# Patient Record
Sex: Male | Born: 1940
Health system: Southern US, Community
[De-identification: ages and names within clinical notes are randomized; demographics above are authoritative.]

## PROBLEM LIST (undated history)

## (undated) DIAGNOSIS — Z9289 Personal history of other medical treatment: Secondary | ICD-10-CM

## (undated) DIAGNOSIS — I4891 Unspecified atrial fibrillation: Secondary | ICD-10-CM

## (undated) DIAGNOSIS — N183 Chronic kidney disease, stage 3 unspecified: Secondary | ICD-10-CM

## (undated) DIAGNOSIS — I350 Nonrheumatic aortic (valve) stenosis: Principal | ICD-10-CM

## (undated) DIAGNOSIS — G4700 Insomnia, unspecified: Secondary | ICD-10-CM

## (undated) DIAGNOSIS — Z9989 Dependence on other enabling machines and devices: Secondary | ICD-10-CM

## (undated) DIAGNOSIS — K635 Polyp of colon: Secondary | ICD-10-CM

## (undated) DIAGNOSIS — E119 Type 2 diabetes mellitus without complications: Secondary | ICD-10-CM

## (undated) DIAGNOSIS — Z950 Presence of cardiac pacemaker: Secondary | ICD-10-CM

## (undated) DIAGNOSIS — M109 Gout, unspecified: Secondary | ICD-10-CM

## (undated) DIAGNOSIS — Z87442 Personal history of urinary calculi: Secondary | ICD-10-CM

## (undated) DIAGNOSIS — E785 Hyperlipidemia, unspecified: Secondary | ICD-10-CM

## (undated) DIAGNOSIS — Z973 Presence of spectacles and contact lenses: Secondary | ICD-10-CM

## (undated) DIAGNOSIS — Z972 Presence of dental prosthetic device (complete) (partial): Secondary | ICD-10-CM

## (undated) DIAGNOSIS — G629 Polyneuropathy, unspecified: Secondary | ICD-10-CM

## (undated) DIAGNOSIS — G4733 Obstructive sleep apnea (adult) (pediatric): Secondary | ICD-10-CM

## (undated) DIAGNOSIS — I219 Acute myocardial infarction, unspecified: Secondary | ICD-10-CM

## (undated) DIAGNOSIS — G510 Bell's palsy: Secondary | ICD-10-CM

## (undated) DIAGNOSIS — I2510 Atherosclerotic heart disease of native coronary artery without angina pectoris: Secondary | ICD-10-CM

## (undated) DIAGNOSIS — N186 End stage renal disease: Secondary | ICD-10-CM

## (undated) DIAGNOSIS — I10 Essential (primary) hypertension: Secondary | ICD-10-CM

## (undated) DIAGNOSIS — I214 Non-ST elevation (NSTEMI) myocardial infarction: Secondary | ICD-10-CM

## (undated) DIAGNOSIS — I495 Sick sinus syndrome: Secondary | ICD-10-CM

## (undated) DIAGNOSIS — F419 Anxiety disorder, unspecified: Secondary | ICD-10-CM

## (undated) HISTORY — DX: Insomnia, unspecified: G47.00

## (undated) HISTORY — DX: Sick sinus syndrome: I49.5

## (undated) HISTORY — DX: Gout, unspecified: M10.9

## (undated) HISTORY — DX: Unspecified atrial fibrillation: I48.91

## (undated) HISTORY — PX: HERNIA REPAIR: SHX51

## (undated) HISTORY — PX: CATARACT EXTRACTION: SUR2

## (undated) HISTORY — DX: Acute myocardial infarction, unspecified: I21.9

## (undated) HISTORY — PX: APPENDECTOMY: SHX54

## (undated) HISTORY — DX: Atherosclerotic heart disease of native coronary artery without angina pectoris: I25.10

## (undated) HISTORY — DX: Chronic kidney disease, stage 3 unspecified: N18.30

## (undated) HISTORY — DX: Type 2 diabetes mellitus without complications: E11.9

## (undated) HISTORY — DX: Essential (primary) hypertension: I10

## (undated) HISTORY — DX: Hyperlipidemia, unspecified: E78.5

## (undated) HISTORY — DX: Non-ST elevation (NSTEMI) myocardial infarction: I21.4

## (undated) HISTORY — DX: Chronic kidney disease, stage 3 (moderate): N18.3

## (undated) HISTORY — DX: Obstructive sleep apnea (adult) (pediatric): G47.33

## (undated) HISTORY — PX: CORONARY ANGIOPLASTY WITH STENT PLACEMENT: SHX49

## (undated) HISTORY — DX: Polyneuropathy, unspecified: G62.9

## (undated) HISTORY — DX: Dependence on other enabling machines and devices: Z99.89

## (undated) HISTORY — DX: Polyp of colon: K63.5

---

## 1996-02-17 DIAGNOSIS — I219 Acute myocardial infarction, unspecified: Secondary | ICD-10-CM

## 1996-02-17 HISTORY — DX: Acute myocardial infarction, unspecified: I21.9

## 2000-03-19 HISTORY — PX: OTHER SURGICAL HISTORY: SHX169

## 2000-05-10 HISTORY — PX: INSERT / REPLACE / REMOVE PACEMAKER: SUR710

## 2009-09-03 HISTORY — PX: PACEMAKER GENERATOR CHANGE: SHX5998

## 2011-02-24 DIAGNOSIS — IMO0001 Reserved for inherently not codable concepts without codable children: Secondary | ICD-10-CM | POA: Diagnosis not present

## 2011-03-27 DIAGNOSIS — I10 Essential (primary) hypertension: Secondary | ICD-10-CM | POA: Diagnosis not present

## 2011-03-27 DIAGNOSIS — IMO0001 Reserved for inherently not codable concepts without codable children: Secondary | ICD-10-CM | POA: Diagnosis not present

## 2011-05-07 DIAGNOSIS — IMO0001 Reserved for inherently not codable concepts without codable children: Secondary | ICD-10-CM | POA: Diagnosis not present

## 2011-05-07 DIAGNOSIS — N183 Chronic kidney disease, stage 3 unspecified: Secondary | ICD-10-CM | POA: Diagnosis not present

## 2011-05-26 DIAGNOSIS — I10 Essential (primary) hypertension: Secondary | ICD-10-CM | POA: Diagnosis not present

## 2011-05-26 DIAGNOSIS — M25519 Pain in unspecified shoulder: Secondary | ICD-10-CM | POA: Diagnosis not present

## 2011-07-16 DIAGNOSIS — IMO0001 Reserved for inherently not codable concepts without codable children: Secondary | ICD-10-CM | POA: Diagnosis not present

## 2011-07-16 DIAGNOSIS — I10 Essential (primary) hypertension: Secondary | ICD-10-CM | POA: Diagnosis not present

## 2011-07-22 DIAGNOSIS — E119 Type 2 diabetes mellitus without complications: Secondary | ICD-10-CM | POA: Diagnosis not present

## 2011-11-04 DIAGNOSIS — I10 Essential (primary) hypertension: Secondary | ICD-10-CM | POA: Diagnosis not present

## 2011-11-04 DIAGNOSIS — IMO0001 Reserved for inherently not codable concepts without codable children: Secondary | ICD-10-CM | POA: Diagnosis not present

## 2011-11-04 DIAGNOSIS — E785 Hyperlipidemia, unspecified: Secondary | ICD-10-CM | POA: Diagnosis not present

## 2011-11-11 DIAGNOSIS — Z23 Encounter for immunization: Secondary | ICD-10-CM | POA: Diagnosis not present

## 2011-11-11 DIAGNOSIS — IMO0001 Reserved for inherently not codable concepts without codable children: Secondary | ICD-10-CM | POA: Diagnosis not present

## 2012-01-26 DIAGNOSIS — I4892 Unspecified atrial flutter: Secondary | ICD-10-CM | POA: Diagnosis not present

## 2012-01-30 DIAGNOSIS — Z7982 Long term (current) use of aspirin: Secondary | ICD-10-CM | POA: Diagnosis not present

## 2012-01-30 DIAGNOSIS — E119 Type 2 diabetes mellitus without complications: Secondary | ICD-10-CM | POA: Diagnosis not present

## 2012-01-30 DIAGNOSIS — R002 Palpitations: Secondary | ICD-10-CM | POA: Diagnosis not present

## 2012-01-30 DIAGNOSIS — N189 Chronic kidney disease, unspecified: Secondary | ICD-10-CM | POA: Diagnosis not present

## 2012-01-30 DIAGNOSIS — Z79899 Other long term (current) drug therapy: Secondary | ICD-10-CM | POA: Diagnosis not present

## 2012-01-30 DIAGNOSIS — R079 Chest pain, unspecified: Secondary | ICD-10-CM | POA: Diagnosis not present

## 2012-01-30 DIAGNOSIS — I252 Old myocardial infarction: Secondary | ICD-10-CM | POA: Diagnosis not present

## 2012-01-30 DIAGNOSIS — Z950 Presence of cardiac pacemaker: Secondary | ICD-10-CM | POA: Diagnosis not present

## 2012-02-01 DIAGNOSIS — Z45018 Encounter for adjustment and management of other part of cardiac pacemaker: Secondary | ICD-10-CM | POA: Diagnosis not present

## 2012-02-04 DIAGNOSIS — E785 Hyperlipidemia, unspecified: Secondary | ICD-10-CM | POA: Diagnosis not present

## 2012-02-04 DIAGNOSIS — M19079 Primary osteoarthritis, unspecified ankle and foot: Secondary | ICD-10-CM | POA: Diagnosis not present

## 2012-02-04 DIAGNOSIS — M79609 Pain in unspecified limb: Secondary | ICD-10-CM | POA: Diagnosis not present

## 2012-02-15 DIAGNOSIS — I4891 Unspecified atrial fibrillation: Secondary | ICD-10-CM | POA: Diagnosis not present

## 2012-02-22 DIAGNOSIS — Z7901 Long term (current) use of anticoagulants: Secondary | ICD-10-CM | POA: Diagnosis not present

## 2012-02-22 DIAGNOSIS — I4892 Unspecified atrial flutter: Secondary | ICD-10-CM | POA: Diagnosis not present

## 2012-02-26 DIAGNOSIS — Z7901 Long term (current) use of anticoagulants: Secondary | ICD-10-CM | POA: Diagnosis not present

## 2012-02-26 DIAGNOSIS — I4892 Unspecified atrial flutter: Secondary | ICD-10-CM | POA: Diagnosis not present

## 2012-02-29 DIAGNOSIS — Z7901 Long term (current) use of anticoagulants: Secondary | ICD-10-CM | POA: Diagnosis not present

## 2012-03-03 DIAGNOSIS — I4892 Unspecified atrial flutter: Secondary | ICD-10-CM | POA: Diagnosis not present

## 2012-03-03 DIAGNOSIS — Z7901 Long term (current) use of anticoagulants: Secondary | ICD-10-CM | POA: Diagnosis not present

## 2012-03-07 DIAGNOSIS — Z7901 Long term (current) use of anticoagulants: Secondary | ICD-10-CM | POA: Diagnosis not present

## 2012-03-31 DIAGNOSIS — I4891 Unspecified atrial fibrillation: Secondary | ICD-10-CM | POA: Diagnosis not present

## 2012-04-05 DIAGNOSIS — E785 Hyperlipidemia, unspecified: Secondary | ICD-10-CM | POA: Diagnosis not present

## 2012-04-05 DIAGNOSIS — IMO0001 Reserved for inherently not codable concepts without codable children: Secondary | ICD-10-CM | POA: Diagnosis not present

## 2012-04-05 DIAGNOSIS — I10 Essential (primary) hypertension: Secondary | ICD-10-CM | POA: Diagnosis not present

## 2012-04-12 DIAGNOSIS — IMO0001 Reserved for inherently not codable concepts without codable children: Secondary | ICD-10-CM | POA: Diagnosis not present

## 2012-04-29 DIAGNOSIS — Z5320 Procedure and treatment not carried out because of patient's decision for unspecified reasons: Secondary | ICD-10-CM | POA: Diagnosis not present

## 2012-04-29 DIAGNOSIS — R7309 Other abnormal glucose: Secondary | ICD-10-CM | POA: Diagnosis not present

## 2012-05-09 DIAGNOSIS — IMO0001 Reserved for inherently not codable concepts without codable children: Secondary | ICD-10-CM | POA: Diagnosis not present

## 2012-05-09 DIAGNOSIS — M109 Gout, unspecified: Secondary | ICD-10-CM | POA: Diagnosis not present

## 2012-05-18 DIAGNOSIS — IMO0001 Reserved for inherently not codable concepts without codable children: Secondary | ICD-10-CM | POA: Diagnosis not present

## 2012-07-29 DIAGNOSIS — M109 Gout, unspecified: Secondary | ICD-10-CM | POA: Diagnosis not present

## 2012-07-29 DIAGNOSIS — N183 Chronic kidney disease, stage 3 unspecified: Secondary | ICD-10-CM | POA: Diagnosis not present

## 2012-07-29 DIAGNOSIS — IMO0001 Reserved for inherently not codable concepts without codable children: Secondary | ICD-10-CM | POA: Diagnosis not present

## 2012-08-01 DIAGNOSIS — Z45018 Encounter for adjustment and management of other part of cardiac pacemaker: Secondary | ICD-10-CM | POA: Diagnosis not present

## 2012-08-03 DIAGNOSIS — E119 Type 2 diabetes mellitus without complications: Secondary | ICD-10-CM | POA: Diagnosis not present

## 2012-10-03 DIAGNOSIS — S51809A Unspecified open wound of unspecified forearm, initial encounter: Secondary | ICD-10-CM | POA: Diagnosis not present

## 2012-12-07 DIAGNOSIS — E785 Hyperlipidemia, unspecified: Secondary | ICD-10-CM | POA: Diagnosis not present

## 2012-12-07 DIAGNOSIS — M109 Gout, unspecified: Secondary | ICD-10-CM | POA: Diagnosis not present

## 2012-12-07 DIAGNOSIS — I10 Essential (primary) hypertension: Secondary | ICD-10-CM | POA: Diagnosis not present

## 2012-12-07 DIAGNOSIS — N183 Chronic kidney disease, stage 3 unspecified: Secondary | ICD-10-CM | POA: Diagnosis not present

## 2012-12-07 DIAGNOSIS — IMO0001 Reserved for inherently not codable concepts without codable children: Secondary | ICD-10-CM | POA: Diagnosis not present

## 2012-12-12 DIAGNOSIS — Z23 Encounter for immunization: Secondary | ICD-10-CM | POA: Diagnosis not present

## 2012-12-13 DIAGNOSIS — IMO0001 Reserved for inherently not codable concepts without codable children: Secondary | ICD-10-CM | POA: Diagnosis not present

## 2012-12-23 DIAGNOSIS — I4891 Unspecified atrial fibrillation: Secondary | ICD-10-CM | POA: Diagnosis not present

## 2012-12-27 DIAGNOSIS — R1011 Right upper quadrant pain: Secondary | ICD-10-CM | POA: Diagnosis not present

## 2012-12-27 DIAGNOSIS — R1032 Left lower quadrant pain: Secondary | ICD-10-CM | POA: Diagnosis not present

## 2012-12-30 DIAGNOSIS — N200 Calculus of kidney: Secondary | ICD-10-CM | POA: Diagnosis not present

## 2012-12-30 DIAGNOSIS — N2883 Nephroptosis: Secondary | ICD-10-CM | POA: Diagnosis not present

## 2013-01-19 DIAGNOSIS — I4891 Unspecified atrial fibrillation: Secondary | ICD-10-CM | POA: Diagnosis not present

## 2013-02-15 DIAGNOSIS — I4891 Unspecified atrial fibrillation: Secondary | ICD-10-CM | POA: Diagnosis not present

## 2013-02-15 DIAGNOSIS — Z79899 Other long term (current) drug therapy: Secondary | ICD-10-CM | POA: Diagnosis not present

## 2013-02-21 DIAGNOSIS — I4892 Unspecified atrial flutter: Secondary | ICD-10-CM | POA: Diagnosis not present

## 2013-03-08 DIAGNOSIS — M109 Gout, unspecified: Secondary | ICD-10-CM | POA: Diagnosis not present

## 2013-03-08 DIAGNOSIS — IMO0001 Reserved for inherently not codable concepts without codable children: Secondary | ICD-10-CM | POA: Diagnosis not present

## 2013-03-15 DIAGNOSIS — IMO0001 Reserved for inherently not codable concepts without codable children: Secondary | ICD-10-CM | POA: Diagnosis not present

## 2013-05-30 DIAGNOSIS — IMO0001 Reserved for inherently not codable concepts without codable children: Secondary | ICD-10-CM | POA: Diagnosis not present

## 2013-05-30 DIAGNOSIS — I10 Essential (primary) hypertension: Secondary | ICD-10-CM | POA: Diagnosis not present

## 2013-05-30 DIAGNOSIS — E785 Hyperlipidemia, unspecified: Secondary | ICD-10-CM | POA: Diagnosis not present

## 2013-05-30 DIAGNOSIS — M109 Gout, unspecified: Secondary | ICD-10-CM | POA: Diagnosis not present

## 2013-06-06 DIAGNOSIS — L570 Actinic keratosis: Secondary | ICD-10-CM | POA: Diagnosis not present

## 2013-09-05 DIAGNOSIS — E119 Type 2 diabetes mellitus without complications: Secondary | ICD-10-CM | POA: Diagnosis not present

## 2013-09-05 DIAGNOSIS — Z1331 Encounter for screening for depression: Secondary | ICD-10-CM | POA: Diagnosis not present

## 2013-09-05 DIAGNOSIS — M109 Gout, unspecified: Secondary | ICD-10-CM | POA: Diagnosis not present

## 2013-09-05 DIAGNOSIS — I10 Essential (primary) hypertension: Secondary | ICD-10-CM | POA: Diagnosis not present

## 2013-09-05 DIAGNOSIS — G4700 Insomnia, unspecified: Secondary | ICD-10-CM | POA: Diagnosis not present

## 2013-09-05 DIAGNOSIS — Z9049 Acquired absence of other specified parts of digestive tract: Secondary | ICD-10-CM | POA: Diagnosis not present

## 2013-09-05 DIAGNOSIS — E785 Hyperlipidemia, unspecified: Secondary | ICD-10-CM | POA: Diagnosis not present

## 2013-09-05 DIAGNOSIS — N183 Chronic kidney disease, stage 3 unspecified: Secondary | ICD-10-CM | POA: Diagnosis not present

## 2013-10-10 ENCOUNTER — Encounter: Payer: Self-pay | Admitting: Cardiovascular Disease

## 2013-10-10 ENCOUNTER — Ambulatory Visit (INDEPENDENT_AMBULATORY_CARE_PROVIDER_SITE_OTHER): Payer: Medicare Other | Admitting: Cardiovascular Disease

## 2013-10-10 VITALS — BP 130/76 | HR 60 | Ht 71.5 in | Wt 230.4 lb

## 2013-10-10 DIAGNOSIS — I4891 Unspecified atrial fibrillation: Secondary | ICD-10-CM | POA: Diagnosis not present

## 2013-10-10 DIAGNOSIS — I480 Paroxysmal atrial fibrillation: Secondary | ICD-10-CM

## 2013-10-10 DIAGNOSIS — I2510 Atherosclerotic heart disease of native coronary artery without angina pectoris: Secondary | ICD-10-CM

## 2013-10-10 DIAGNOSIS — M109 Gout, unspecified: Secondary | ICD-10-CM | POA: Insufficient documentation

## 2013-10-10 LAB — CBC WITH DIFFERENTIAL/PLATELET
BASOS ABS: 0 10*3/uL (ref 0.0–0.1)
Basophils Relative: 0.4 % (ref 0.0–3.0)
EOS ABS: 0.5 10*3/uL (ref 0.0–0.7)
Eosinophils Relative: 6.8 % — ABNORMAL HIGH (ref 0.0–5.0)
HEMATOCRIT: 40.5 % (ref 39.0–52.0)
HEMOGLOBIN: 13.6 g/dL (ref 13.0–17.0)
LYMPHS ABS: 1.7 10*3/uL (ref 0.7–4.0)
Lymphocytes Relative: 21.3 % (ref 12.0–46.0)
MCHC: 33.6 g/dL (ref 30.0–36.0)
MCV: 92.5 fl (ref 78.0–100.0)
MONOS PCT: 6.4 % (ref 3.0–12.0)
Monocytes Absolute: 0.5 10*3/uL (ref 0.1–1.0)
NEUTROS ABS: 5.1 10*3/uL (ref 1.4–7.7)
Neutrophils Relative %: 65.1 % (ref 43.0–77.0)
Platelets: 139 10*3/uL — ABNORMAL LOW (ref 150.0–400.0)
RBC: 4.38 Mil/uL (ref 4.22–5.81)
RDW: 14.4 % (ref 11.5–15.5)
WBC: 7.9 10*3/uL (ref 4.0–10.5)

## 2013-10-10 LAB — BASIC METABOLIC PANEL
BUN: 33 mg/dL — ABNORMAL HIGH (ref 6–23)
CO2: 25 meq/L (ref 19–32)
Calcium: 9 mg/dL (ref 8.4–10.5)
Chloride: 105 mEq/L (ref 96–112)
Creatinine, Ser: 2.6 mg/dL — ABNORMAL HIGH (ref 0.4–1.5)
GFR: 25.33 mL/min — ABNORMAL LOW (ref >60.00)
GLUCOSE: 274 mg/dL — AB (ref 70–99)
POTASSIUM: 5.5 meq/L — AB (ref 3.5–5.1)
SODIUM: 136 meq/L (ref 135–145)

## 2013-10-10 LAB — TSH: TSH: 1.94 u[IU]/mL (ref 0.35–4.50)

## 2013-10-10 LAB — HEPATIC FUNCTION PANEL
ALT: 50 U/L (ref 0–53)
AST: 34 U/L (ref 0–37)
Albumin: 3.8 g/dL (ref 3.5–5.2)
Alkaline Phosphatase: 78 U/L (ref 39–117)
Bilirubin, Direct: 0.1 mg/dL (ref 0.0–0.3)
Total Bilirubin: 0.7 mg/dL (ref 0.2–1.2)
Total Protein: 7.1 g/dL (ref 6.0–8.3)

## 2013-10-10 LAB — LIPID PANEL
Cholesterol: 112 mg/dL (ref 0–200)
HDL: 38.1 mg/dL — ABNORMAL LOW (ref >39.00)
NonHDL: 73.9
Total CHOL/HDL Ratio: 3
Triglycerides: 237 mg/dL — ABNORMAL HIGH (ref 0.0–149.0)
VLDL: 47.4 mg/dL — AB (ref 0.0–40.0)

## 2013-10-10 MED ORDER — NITROGLYCERIN 0.4 MG SL SUBL: 0.4000 mg | SUBLINGUAL_TABLET | SUBLINGUAL | Status: DC | PRN

## 2013-10-10 NOTE — Patient Instructions (Signed)
Your physician recommends that you have lab work:  TODAY - Thyroid, liver, basic metabolic, CBC, lipid  Your physician recommends that you continue on your current medications as directed. Please refer to the Current Medication list given to you today.  Your physician wants you to follow-up in: 1 year with Dr. [name].  You will receive a reminder letter in the mail two months in advance. If you don't receive a letter, please call our office to schedule the follow-up appointment.

## 2013-10-10 NOTE — Progress Notes (Signed)
Bradley Soto Date of Birth  [birth year]       [facility]    [employer] [address], [place], [place]  [address]   [place], [place]  [address] [place] [place]   Fax  [phone]     Fax [phone]  Problem List: 1. Coronary artery disease-status post stenting 2. Sick sinus syndrome-status post pacemaker. History of atrial fib 3. CKD 4. Diabetes Mellitus 5. Gout 6.   History of Present Illness:  Pt recently moved from [place], [place] ( near [employer])  Here to establish cardiology care. 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     Current Outpatient Prescriptions  Medication Sig Dispense Refill  . allopurinol (ZYLOPRIM) 100 MG tablet Take 100 mg by mouth 2 (two) times daily.      Marland Kitchen amiodarone (PACERONE) 200 MG tablet Take 200 mg by mouth daily.      Marland Kitchen amLODipine (NORVASC) 2.5 MG tablet Take 2.5 mg by mouth daily.      . cimetidine (TAGAMET) 300 MG tablet Take 300 mg by mouth 4 (four) times daily.      . clopidogrel (PLAVIX) 75 MG tablet Take 75 mg by mouth daily.      . colchicine 0.6 MG tablet Take 2.5 mg by mouth as needed.      . dabigatran (PRADAXA) 75 MG CAPS capsule Take 75 mg by mouth 2 (two) times daily.      Marland Kitchen diltiazem (CARDIZEM) 60 MG tablet Take 60 mg by mouth 4 (four) times daily as needed.       . isosorbide dinitrate (ISORDIL) 30 MG tablet Take 30 mg by mouth daily.      . Melatonin 10 MG TABS Take by mouth at bedtime as needed.      . metoprolol succinate (TOPROL-XL) 100 MG 24 hr tablet Take 100 mg by mouth 2 (two) times daily. Take with or immediately following a meal. 1/2 IN THE AM  1/2 IN THE PM      . nitroGLYCERIN (NITROSTAT) 0.4 MG SL tablet Place 0.4 mg under the tongue every 5 (five) minutes as needed for chest pain.      . simvastatin (ZOCOR) 20 MG tablet Take 20 mg by mouth daily. 1/2 TAB QD      . sitaGLIPtin (JANUVIA) 50  MG tablet Take 50 mg by mouth daily.      . traZODone (DESYREL) 50 MG tablet Take 50 mg by mouth at bedtime.       No current facility-administered medications for this visit.     Allergies  Allergen Reactions  . Shellfish Allergy     Past Medical History  Diagnosis Date  . Diabetes mellitus without complication   . MI (myocardial infarction) 1998  . Gout     Past Surgical History  Procedure Laterality Date  . Coronary angioplasty with stent placement    . Insert / replace / remove pacemaker    . Partial colon removal    . Hernia repair    . Appendectomy      History  Smoking status  . Never Smoker   Smokeless tobacco  . Not on file    History  Alcohol Use No    Family History  Problem Relation Age of Onset  . Heart attack Father   . Breast cancer Sister   . Pulmonary embolism  Sister     Reviw of Systems:  Reviewed in the HPI.  All other systems are negative.  Physical Exam: Blood pressure 130/76, pulse 60, height 5' 11.5" (1.816 m), weight 230 lb 6.4 oz (104.509 kg). Wt Readings from Last 3 Encounters:  10/10/13 230 lb 6.4 oz (104.509 kg)     General: Well developed, well nourished, in no acute distress.  Head: Normocephalic, atraumatic, sclera non-icteric, mucus membranes are moist,   Neck: Supple. Carotids are 2 + without bruits. No JVD   Lungs: Clear   Heart: RR, normal S1S2  Abdomen: Soft, non-tender, non-distended with normal bowel sounds.  Msk:  Strength and tone are normal   Extremities: No clubbing or cyanosis. No edema.  Distal pedal pulses are 2+ and equal    Neuro: CN II - XII intact.  Alert and oriented X 3.   Psych:  Normal   ECG: October 10, 2013:   A pacing at 60   Assessment / Plan:

## 2013-10-10 NOTE — Assessment & Plan Note (Signed)
Bradley Soto is doing very well. [name] not having episodes of chest pain. We will continue with his same medications. We'll check fasting lipids, liver enzymes comment based about profile, CBC, TSH today. I will see him  again in one year. He has an appointment to see Dr. Rayann Heman in a week or so.

## 2013-10-11 ENCOUNTER — Telehealth: Payer: Self-pay | Admitting: *Deleted

## 2013-10-11 LAB — LDL CHOLESTEROL, DIRECT: LDL DIRECT: 50.5 mg/dL

## 2013-10-11 NOTE — Telephone Encounter (Signed)
Patient has a PCP now with [facility], Loleta Books. Instructed on decreasing dietary intake of potassium. Spoke with his wife, verbalizes understanding.

## 2013-10-11 NOTE — Telephone Encounter (Signed)
Message copied by Rodman Key on Wed Oct 11, 2013  5:14 PM ------      Message from: Thayer Headings      Created: Wed Oct 11, 2013  4:49 PM       I have replied to this but I dont think I addressed the potassiu,.  He has known CKD and i suspect these labs are stable.      Have him decrease his intake of potassium.        He needs to get a primary medical doctor            ----- Message -----         From: Rodman Key, RN         Sent: 10/11/2013   4:22 PM           To: Thayer Headings, MD            Hi Dr. [name],            Can you take a look at Mr. Reynold's labs from yesterday?      Please let me know if you want to make any changes based on his K+ 5.5 yesterday.      Thank you      Sindy Mccune       ------

## 2013-10-13 ENCOUNTER — Telehealth: Payer: Self-pay | Admitting: Nurse Practitioner

## 2013-10-13 DIAGNOSIS — E875 Hyperkalemia: Secondary | ICD-10-CM

## 2013-10-13 NOTE — Telephone Encounter (Signed)
Message copied by Emmaline Life on Fri Oct 13, 2013  4:10 PM ------      Message from: MURPHY, NIVIDA L      Created: Wed Oct 11, 2013  5:28 PM                   ----- Message -----         From: Thayer Headings, MD         Sent: 10/11/2013   4:48 PM           To: Lynann Bologna, RN            His creatinine is elevated. He has a known history of chronic kidney disease. We do not have any old laboratory to compare that clinically he stable. You will need to followup with his general medical Dr.\      Needs to work on a improved diet and exercise program. Recheck his fasting lipids, being that, and liver enzymes in 6 months. ------

## 2013-10-13 NOTE — Telephone Encounter (Signed)
Spoke with patient's wife and reviewed lab results and instructions per Dr. [name].  Patient is scheduled for repeat BMET Monday 8/31 when he comes in to see Dr. Rayann Heman.  Order in epic.  Per Dr. [name], he would like to order an echo to evaluation patient's LV function.  Wife states she believes patient has had one at cardiologist in [place] and will call to try to get a copy faxed to [place].  Wife states patient does not like to have tests done unless he is experiencing problems and states currently patient has no complaints.  Will await results of call to previous cardiologist.

## 2013-10-16 ENCOUNTER — Other Ambulatory Visit (INDEPENDENT_AMBULATORY_CARE_PROVIDER_SITE_OTHER): Payer: Medicare Other

## 2013-10-16 ENCOUNTER — Ambulatory Visit (INDEPENDENT_AMBULATORY_CARE_PROVIDER_SITE_OTHER): Payer: Medicare Other | Admitting: Internal Medicine

## 2013-10-16 VITALS — BP 142/74 | HR 60 | Ht 71.5 in | Wt 229.8 lb

## 2013-10-16 DIAGNOSIS — I2510 Atherosclerotic heart disease of native coronary artery without angina pectoris: Secondary | ICD-10-CM

## 2013-10-16 DIAGNOSIS — I4891 Unspecified atrial fibrillation: Secondary | ICD-10-CM | POA: Diagnosis not present

## 2013-10-16 DIAGNOSIS — E875 Hyperkalemia: Secondary | ICD-10-CM | POA: Diagnosis not present

## 2013-10-16 DIAGNOSIS — I495 Sick sinus syndrome: Secondary | ICD-10-CM | POA: Diagnosis not present

## 2013-10-16 DIAGNOSIS — I498 Other specified cardiac arrhythmias: Secondary | ICD-10-CM

## 2013-10-16 DIAGNOSIS — I480 Paroxysmal atrial fibrillation: Secondary | ICD-10-CM

## 2013-10-16 DIAGNOSIS — R001 Bradycardia, unspecified: Secondary | ICD-10-CM

## 2013-10-16 LAB — BASIC METABOLIC PANEL
BUN: 31 mg/dL — ABNORMAL HIGH (ref 6–23)
CHLORIDE: 107 meq/L (ref 96–112)
CO2: 23 meq/L (ref 19–32)
CREATININE: 2.2 mg/dL — AB (ref 0.4–1.5)
Calcium: 9.1 mg/dL (ref 8.4–10.5)
GFR: 30.93 mL/min — ABNORMAL LOW (ref >60.00)
GLUCOSE: 205 mg/dL — AB (ref 70–99)
Potassium: 4.8 mEq/L (ref 3.5–5.1)
Sodium: 137 mEq/L (ref 135–145)

## 2013-10-16 LAB — MDC_IDC_ENUM_SESS_TYPE_INCLINIC
Battery Impedance: 419 Ohm
Battery Remaining Longevity: 97 mo
Brady Statistic AP VP Percent: 0 %
Brady Statistic AS VP Percent: 0 %
Lead Channel Impedance Value: 572 Ohm
Lead Channel Pacing Threshold Amplitude: 0.5 V
Lead Channel Pacing Threshold Amplitude: 0.75 V
Lead Channel Sensing Intrinsic Amplitude: 1.4 mV
Lead Channel Sensing Intrinsic Amplitude: 22.4 mV
Lead Channel Setting Pacing Amplitude: 2.25 V
Lead Channel Setting Pacing Amplitude: 2.5 V
Lead Channel Setting Pacing Pulse Width: 0.4 ms
MDC IDC MSMT BATTERY VOLTAGE: 2.8 V
MDC IDC MSMT LEADCHNL RA PACING THRESHOLD PULSEWIDTH: 0.4 ms
MDC IDC MSMT LEADCHNL RV IMPEDANCE VALUE: 674 Ohm
MDC IDC MSMT LEADCHNL RV PACING THRESHOLD PULSEWIDTH: 0.4 ms
MDC IDC SESS DTM: 20150831112722
MDC IDC SET LEADCHNL RV SENSING SENSITIVITY: 5.6 mV
MDC IDC STAT BRADY AP VS PERCENT: 83 %
MDC IDC STAT BRADY AS VS PERCENT: 17 %

## 2013-10-16 NOTE — Patient Instructions (Signed)
Your physician wants you to follow-up in: 12 months with Dr Rayann Heman. You will receive a reminder letter in the mail two months in advance. If you don't receive a letter, please call our office to schedule the follow-up appointment.    Remote monitoring is used to monitor your Pacemaker of ICD from home. This monitoring reduces the number of office visits required to check your device to one time per year. It allows [place] to keep an eye on the functioning of your device to ensure it is working properly. You are scheduled for a device check from home on 01/17/14. You may send your transmission at any time that day. If you have a wireless device, the transmission will be sent automatically. After your physician reviews your transmission, you will receive a postcard with your next transmission date.

## 2013-10-16 NOTE — Progress Notes (Signed)
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  He reports initially presenting with fatigue and "low heart rate" in 2002 and underwent PPM 05/10/2000.  The patient reports doing very well since having a pacemaker implanted and remains very active despite his age.  He underwent PPM generator change (MDT) 09/03/2009.  Today, he  denies symptoms of palpitations, chest pain, shortness of breath, orthopnea, PND, lower extremity edema, dizziness, presyncope, syncope, or neurologic sequela.  He has OSA for which he uses CPAP.  The patientis tolerating medications without difficulties and is otherwise without complaint today.   Past Medical History  Diagnosis Date  . Diabetes mellitus without complication   . MI (myocardial infarction) 1998  . Gout   . CAD (coronary artery disease)     s/p stent placement  . CKD (chronic kidney disease), stage III   . OSA on CPAP   . Insomnia   . Colon polyps   . SSS (sick sinus syndrome)   . NSTEMI (non-ST elevated myocardial infarction)     s/p PCI in early 2000s  . HTN (hypertension)   . HLD (hyperlipidemia)   . Type II or unspecified type diabetes mellitus without mention of complication, not stated as uncontrolled    Past Surgical History  Procedure Laterality Date  . Coronary angioplasty with stent placement      x4  . Insert / replace / remove pacemaker  05/10/2000    pacemaker implanted in [place]  . Partial colon removal  2/2    polyp that could not be removed on colonoscopy  . Hernia repair      ventral hernia repair with mesh--revision also was required  . Appendectomy    . Cataract extraction    . Pacemaker generator change  09/03/09    MDT Adapta generator change in [place]    History   Social History  . Marital Status: Married    Spouse Name: N/A   Number of Children: N/A  . Years of Education: N/A   Occupational History  . Not on file.   Social History Main Topics  . Smoking status: Never Smoker   . Smokeless tobacco: Not on file  . Alcohol Use: No  . Drug Use: No  . Sexual Activity: Not on file   Other Topics Concern  . Not on file   Social History Narrative  . No narrative on file    Family History  Problem Relation Age of Onset  . Heart attack Father 16  . Heart disease Father   . Breast cancer Sister   . Pulmonary embolism Sister   . Deep vein thrombosis Sister     Allergies  Allergen Reactions  . Shellfish Allergy Rash    Current Outpatient Prescriptions  Medication Sig Dispense Refill  . allopurinol (ZYLOPRIM) 100 MG tablet Take 100 mg by mouth 2 (two) times daily.      Marland Kitchen amiodarone (PACERONE) 200 MG tablet Take 200 mg by mouth daily.      Marland Kitchen amLODipine (NORVASC) 2.5 MG tablet Take 2.5 mg by mouth daily.      . clopidogrel (PLAVIX) 75 MG tablet Take 75 mg by mouth daily.      . colchicine 0.6 MG tablet Take 0.6 mg by mouth as needed (gout).       Marland Kitchen  dabigatran (PRADAXA) 75 MG CAPS capsule Take 75 mg by mouth 2 (two) times daily.      Marland Kitchen diltiazem (CARDIZEM) 60 MG tablet Take 60 mg by mouth 4 (four) times daily as needed (palpitations).       . isosorbide dinitrate (ISORDIL) 30 MG tablet Take 30 mg by mouth daily.      . Melatonin 10 MG TABS Take 1 tablet by mouth at bedtime as needed (sleep).       . metoprolol succinate (TOPROL-XL) 100 MG 24 hr tablet Take 50 mg by mouth 2 (two) times daily. Take with or immediately following a meal.      . nitroGLYCERIN (NITROSTAT) 0.4 MG SL tablet Place 1 tablet (0.4 mg total) under the tongue every 5 (five) minutes as needed for chest pain.  45 tablet  6  . simvastatin (ZOCOR) 20 MG tablet Take 10 mg by mouth daily.       . sitaGLIPtin (JANUVIA) 50 MG tablet Take 50 mg by mouth daily.      . traZODone (DESYREL) 50 MG tablet Take 50 mg by mouth at bedtime.       No  current facility-administered medications for this visit.    ROS- all systems are reviewed and negative except as per HPI  Physical Exam: Filed Vitals:   10/16/13 1042  BP: 142/74  Pulse: 60  Height: 5' 11.5" (1.816 m)  Weight: 229 lb 12.8 oz (104.237 kg)    GEN- The patient is well appearing, alert and oriented x 3 today.   Head- normocephalic, atraumatic Eyes-  Sclera clear, conjunctiva pink Ears- hearing intact Oropharynx- clear Neck- supple, no JVP Lymph- no cervical lymphadenopathy Lungs- Clear to ausculation bilaterally, normal work of breathing Chest- pacemaker pocket is well healed Heart- Regular rate and rhythm, no murmurs, rubs or gallops, PMI not laterally displaced GI- soft, NT, ND, + BS Extremities- no clubbing, cyanosis, or edema MS- no significant deformity or atrophy Skin- no rash or lesion Psych- euthymic mood, full affect Neuro- strength and sensation are intact  Pacemaker interrogation- reviewed in detail today,  See PACEART report ekg today reveals atrial pacing, PR 178, otherwise normal ekg  Assessment and Plan:  1. Sick sinus syndrome Normal pacemaker function See Claudia Desanctis Art report No changes today Epic records are reviewed today Records from [place] are also reviewed  2. Atrial fibrillation Well controlled Continue pradaxa.  Dr [name] to follow creatinine clearance for dosing Continue amiodarone PCP to follow LFTs and TFTs  3. CAD Stable No change required today  Enroll in [employer] for remote monitoring Return to see me annually

## 2013-10-18 DIAGNOSIS — I2510 Atherosclerotic heart disease of native coronary artery without angina pectoris: Secondary | ICD-10-CM | POA: Diagnosis not present

## 2013-10-18 DIAGNOSIS — E119 Type 2 diabetes mellitus without complications: Secondary | ICD-10-CM | POA: Diagnosis not present

## 2013-10-18 DIAGNOSIS — Z125 Encounter for screening for malignant neoplasm of prostate: Secondary | ICD-10-CM | POA: Diagnosis not present

## 2013-10-19 ENCOUNTER — Telehealth: Payer: Self-pay | Admitting: Cardiovascular Disease

## 2013-10-19 NOTE — Telephone Encounter (Signed)
Results reviewed with patient's wife who verbalized understanding and gratitude

## 2013-10-19 NOTE — Telephone Encounter (Signed)
Follow up  Pt wife returned call for lab results//sr

## 2013-10-23 ENCOUNTER — Encounter: Payer: Self-pay | Admitting: Internal Medicine

## 2013-10-25 ENCOUNTER — Telehealth: Payer: Self-pay | Admitting: *Deleted

## 2013-10-25 DIAGNOSIS — R972 Elevated prostate specific antigen [PSA]: Secondary | ICD-10-CM | POA: Diagnosis not present

## 2013-10-25 DIAGNOSIS — E1129 Type 2 diabetes mellitus with other diabetic kidney complication: Secondary | ICD-10-CM | POA: Diagnosis not present

## 2013-10-25 DIAGNOSIS — E785 Hyperlipidemia, unspecified: Secondary | ICD-10-CM | POA: Diagnosis not present

## 2013-10-25 DIAGNOSIS — I4891 Unspecified atrial fibrillation: Secondary | ICD-10-CM | POA: Diagnosis not present

## 2013-10-25 DIAGNOSIS — I10 Essential (primary) hypertension: Secondary | ICD-10-CM | POA: Diagnosis not present

## 2013-10-25 DIAGNOSIS — M109 Gout, unspecified: Secondary | ICD-10-CM | POA: Diagnosis not present

## 2013-10-25 DIAGNOSIS — Z0000 Encounter for general adult medical examination without abnormal findings: Secondary | ICD-10-CM | POA: Diagnosis not present

## 2013-10-25 DIAGNOSIS — I259 Chronic ischemic heart disease, unspecified: Secondary | ICD-10-CM | POA: Diagnosis not present

## 2013-10-25 DIAGNOSIS — I495 Sick sinus syndrome: Secondary | ICD-10-CM | POA: Diagnosis not present

## 2013-10-25 NOTE — Telephone Encounter (Signed)
Pradaxa samples placed at the front desk for pick up.

## 2013-10-31 ENCOUNTER — Telehealth: Payer: Self-pay | Admitting: Internal Medicine

## 2013-10-31 NOTE — Telephone Encounter (Signed)
New message           Pt would like to have him bpm changed on his device

## 2013-11-02 NOTE — Telephone Encounter (Signed)
Appt made for 9-23 @ 11:30.

## 2013-11-06 DIAGNOSIS — Z23 Encounter for immunization: Secondary | ICD-10-CM | POA: Diagnosis not present

## 2013-11-08 ENCOUNTER — Encounter: Payer: Self-pay | Admitting: Internal Medicine

## 2013-11-08 ENCOUNTER — Ambulatory Visit (INDEPENDENT_AMBULATORY_CARE_PROVIDER_SITE_OTHER): Payer: Medicare Other | Admitting: *Deleted

## 2013-11-08 DIAGNOSIS — I495 Sick sinus syndrome: Secondary | ICD-10-CM

## 2013-11-08 LAB — MDC_IDC_ENUM_SESS_TYPE_INCLINIC
Battery Remaining Longevity: 90 mo
Brady Statistic AP VP Percent: 0 %
Brady Statistic AS VS Percent: 10 %
Lead Channel Impedance Value: 706 Ohm
Lead Channel Pacing Threshold Amplitude: 0.5 V
Lead Channel Pacing Threshold Pulse Width: 0.4 ms
Lead Channel Sensing Intrinsic Amplitude: 2 mV
Lead Channel Sensing Intrinsic Amplitude: 22.4 mV
Lead Channel Setting Pacing Amplitude: 2 V
Lead Channel Setting Pacing Pulse Width: 0.4 ms
Lead Channel Setting Sensing Sensitivity: 5.6 mV
MDC IDC MSMT BATTERY IMPEDANCE: 445 Ohm
MDC IDC MSMT BATTERY VOLTAGE: 2.79 V
MDC IDC MSMT LEADCHNL RA IMPEDANCE VALUE: 611 Ohm
MDC IDC MSMT LEADCHNL RA PACING THRESHOLD AMPLITUDE: 0.75 V
MDC IDC MSMT LEADCHNL RA PACING THRESHOLD PULSEWIDTH: 0.4 ms
MDC IDC SESS DTM: 20150923115902
MDC IDC SET LEADCHNL RV PACING AMPLITUDE: 2.5 V
MDC IDC STAT BRADY AP VS PERCENT: 89 %
MDC IDC STAT BRADY AS VP PERCENT: 0 %

## 2013-11-08 NOTE — Progress Notes (Signed)
Pacemaker check in clinic. Normal device function. Thresholds, sensing, impedances consistent with previous measurements. Device programmed to maximize longevity. 1 mode switch, 0.4%,  + pradaxa.  Max A >400/Max V 132bpm.  No high ventricular rates noted. Device programmed at appropriate safety margins. Histogram distribution  blunted, rate response programmed on today.   Device programmed to optimize intrinsic conduction. Estimated longevity 7.5 years. Patient enrolled in remote follow-up/TTM's with [employer]. Plan to follow every 3 months remotely and see annually in office. Patient education completed.  [facility] 02/12/14.

## 2013-11-10 ENCOUNTER — Encounter: Payer: Self-pay | Admitting: Cardiology

## 2014-01-22 DIAGNOSIS — L259 Unspecified contact dermatitis, unspecified cause: Secondary | ICD-10-CM | POA: Diagnosis not present

## 2014-01-22 DIAGNOSIS — Z6838 Body mass index (BMI) 38.0-38.9, adult: Secondary | ICD-10-CM | POA: Diagnosis not present

## 2014-01-24 DIAGNOSIS — L259 Unspecified contact dermatitis, unspecified cause: Secondary | ICD-10-CM | POA: Diagnosis not present

## 2014-01-24 DIAGNOSIS — Z6833 Body mass index (BMI) 33.0-33.9, adult: Secondary | ICD-10-CM | POA: Diagnosis not present

## 2014-01-24 DIAGNOSIS — I10 Essential (primary) hypertension: Secondary | ICD-10-CM | POA: Diagnosis not present

## 2014-01-24 DIAGNOSIS — E1165 Type 2 diabetes mellitus with hyperglycemia: Secondary | ICD-10-CM | POA: Diagnosis not present

## 2014-01-24 DIAGNOSIS — N183 Chronic kidney disease, stage 3 (moderate): Secondary | ICD-10-CM | POA: Diagnosis not present

## 2014-01-24 DIAGNOSIS — E785 Hyperlipidemia, unspecified: Secondary | ICD-10-CM | POA: Diagnosis not present

## 2014-01-24 DIAGNOSIS — Z79899 Other long term (current) drug therapy: Secondary | ICD-10-CM | POA: Diagnosis not present

## 2014-01-29 DIAGNOSIS — E875 Hyperkalemia: Secondary | ICD-10-CM | POA: Diagnosis not present

## 2014-02-14 ENCOUNTER — Other Ambulatory Visit: Payer: Self-pay | Admitting: *Deleted

## 2014-02-14 MED ORDER — DABIGATRAN ETEXILATE MESYLATE 75 MG PO CAPS: 75.0000 mg | ORAL_CAPSULE | Freq: Two times a day (BID) | ORAL | Status: DC

## 2014-03-07 ENCOUNTER — Encounter: Payer: Self-pay | Admitting: Internal Medicine

## 2014-03-07 ENCOUNTER — Ambulatory Visit (INDEPENDENT_AMBULATORY_CARE_PROVIDER_SITE_OTHER): Payer: Medicare Other | Admitting: *Deleted

## 2014-03-07 DIAGNOSIS — I495 Sick sinus syndrome: Secondary | ICD-10-CM | POA: Diagnosis not present

## 2014-03-07 DIAGNOSIS — I480 Paroxysmal atrial fibrillation: Secondary | ICD-10-CM | POA: Diagnosis not present

## 2014-03-07 LAB — MDC_IDC_ENUM_SESS_TYPE_INCLINIC
Battery Impedance: 443 Ohm
Battery Remaining Longevity: 85 mo
Brady Statistic AP VS Percent: 97 %
Brady Statistic AS VP Percent: 0 %
Brady Statistic AS VS Percent: 2 %
Date Time Interrogation Session: 20160120120023
Lead Channel Impedance Value: 564 Ohm
Lead Channel Impedance Value: 682 Ohm
Lead Channel Pacing Threshold Amplitude: 0.75 V
Lead Channel Pacing Threshold Pulse Width: 0.4 ms
Lead Channel Sensing Intrinsic Amplitude: 2 mV
MDC IDC MSMT BATTERY VOLTAGE: 2.79 V
MDC IDC MSMT LEADCHNL RV SENSING INTR AMPL: 22.4 mV
MDC IDC SET LEADCHNL RA PACING AMPLITUDE: 2.25 V
MDC IDC SET LEADCHNL RV PACING AMPLITUDE: 2.5 V
MDC IDC SET LEADCHNL RV PACING PULSEWIDTH: 0.4 ms
MDC IDC SET LEADCHNL RV SENSING SENSITIVITY: 5.6 mV
MDC IDC STAT BRADY AP VP PERCENT: 0 %

## 2014-03-07 NOTE — Progress Notes (Signed)
Pacemaker check in clinic. Normal device function. Thresholds, sensing, impedances consistent with previous measurements. Device programmed to maximize longevity. 2508 mode switches (1.9%)---max dur. 3 hrs 47 mins (1/20), Max A >400, Max Avg V 103---pt currently in PAF/beginning this am + Pradaxa/Amio. Pt also takes PRN Dilt for occas sx's w/AF, which he states is very helpful. No high ventricular rates noted. Device programmed at appropriate safety margins. Histogram distribution appropriate for patient activity level. Device programmed to optimize intrinsic conduction. PVAB decreased from 133ms to 123ms to allow for proper mode switching. Estimated longevity 7 years. Patient will follow up via [facility] on 4-20 and with JA in 09-2014.

## 2014-04-11 ENCOUNTER — Telehealth: Payer: Self-pay | Admitting: Internal Medicine

## 2014-04-11 NOTE — Telephone Encounter (Signed)
He should be fine to continue his current regimen

## 2014-04-11 NOTE — Telephone Encounter (Signed)
New message     Pt has AFIB.  His PCP said it may not be a good idea to take amiodarone and trazadone.  He takes the trazadone to help him sleep.

## 2014-04-12 NOTE — Telephone Encounter (Signed)
Patients wife aware

## 2014-04-12 NOTE — Telephone Encounter (Signed)
F/u ° ° °Pt waiting on call back from nurse concerning previous message. °

## 2014-06-01 DIAGNOSIS — N183 Chronic kidney disease, stage 3 (moderate): Secondary | ICD-10-CM | POA: Diagnosis not present

## 2014-06-01 DIAGNOSIS — Z6833 Body mass index (BMI) 33.0-33.9, adult: Secondary | ICD-10-CM | POA: Diagnosis not present

## 2014-06-01 DIAGNOSIS — I10 Essential (primary) hypertension: Secondary | ICD-10-CM | POA: Diagnosis not present

## 2014-06-01 DIAGNOSIS — I25810 Atherosclerosis of coronary artery bypass graft(s) without angina pectoris: Secondary | ICD-10-CM | POA: Diagnosis not present

## 2014-06-01 DIAGNOSIS — I129 Hypertensive chronic kidney disease with stage 1 through stage 4 chronic kidney disease, or unspecified chronic kidney disease: Secondary | ICD-10-CM | POA: Diagnosis not present

## 2014-06-01 DIAGNOSIS — E1165 Type 2 diabetes mellitus with hyperglycemia: Secondary | ICD-10-CM | POA: Diagnosis not present

## 2014-06-06 ENCOUNTER — Encounter: Payer: Medicare Other | Admitting: *Deleted

## 2014-06-06 ENCOUNTER — Telehealth: Payer: Self-pay | Admitting: Cardiology

## 2014-06-06 NOTE — Telephone Encounter (Signed)
LMOVM reminding pt to send remote transmission.   

## 2014-06-07 ENCOUNTER — Encounter: Payer: Self-pay | Admitting: Cardiology

## 2014-08-30 DIAGNOSIS — Z6833 Body mass index (BMI) 33.0-33.9, adult: Secondary | ICD-10-CM | POA: Diagnosis not present

## 2014-08-30 DIAGNOSIS — I10 Essential (primary) hypertension: Secondary | ICD-10-CM | POA: Diagnosis not present

## 2014-08-30 DIAGNOSIS — I129 Hypertensive chronic kidney disease with stage 1 through stage 4 chronic kidney disease, or unspecified chronic kidney disease: Secondary | ICD-10-CM | POA: Diagnosis not present

## 2014-08-30 DIAGNOSIS — E1165 Type 2 diabetes mellitus with hyperglycemia: Secondary | ICD-10-CM | POA: Diagnosis not present

## 2014-08-30 DIAGNOSIS — E785 Hyperlipidemia, unspecified: Secondary | ICD-10-CM | POA: Diagnosis not present

## 2014-09-18 DIAGNOSIS — E875 Hyperkalemia: Secondary | ICD-10-CM | POA: Diagnosis not present

## 2014-10-26 ENCOUNTER — Encounter: Payer: Self-pay | Admitting: *Deleted

## 2014-11-08 DIAGNOSIS — Z23 Encounter for immunization: Secondary | ICD-10-CM | POA: Diagnosis not present

## 2014-11-14 ENCOUNTER — Encounter: Payer: Self-pay | Admitting: *Deleted

## 2014-11-16 DIAGNOSIS — E119 Type 2 diabetes mellitus without complications: Secondary | ICD-10-CM | POA: Diagnosis not present

## 2014-11-26 ENCOUNTER — Ambulatory Visit (INDEPENDENT_AMBULATORY_CARE_PROVIDER_SITE_OTHER): Payer: Medicare Other | Admitting: Internal Medicine

## 2014-11-26 ENCOUNTER — Encounter: Payer: Self-pay | Admitting: Internal Medicine

## 2014-11-26 VITALS — BP 134/88 | HR 67 | Ht 71.0 in | Wt 232.2 lb

## 2014-11-26 DIAGNOSIS — I495 Sick sinus syndrome: Secondary | ICD-10-CM | POA: Diagnosis not present

## 2014-11-26 DIAGNOSIS — Z45018 Encounter for adjustment and management of other part of cardiac pacemaker: Secondary | ICD-10-CM | POA: Diagnosis not present

## 2014-11-26 DIAGNOSIS — I480 Paroxysmal atrial fibrillation: Secondary | ICD-10-CM | POA: Diagnosis not present

## 2014-11-26 LAB — CUP PACEART INCLINIC DEVICE CHECK
Date Time Interrogation Session: 20161010123940
Lead Channel Setting Pacing Amplitude: 2.25 V
Lead Channel Setting Pacing Amplitude: 2.5 V
Lead Channel Setting Pacing Pulse Width: 0.4 ms
MDC IDC SET LEADCHNL RV SENSING SENSITIVITY: 5.6 mV

## 2014-11-26 NOTE — Patient Instructions (Addendum)
Medication Instructions:  Your physician recommends that you continue on your current medications as directed. Please refer to the Current Medication list given to you today.  Labwork: None ordered  Testing/Procedures: None ordered  Follow-Up: Your physician wants you to follow-up in: 6 months with Dr. [name].  You will receive a reminder letter in the mail two months in advance. If you don't receive a letter, please call our office to schedule the follow-up appointment.  Remote monitoring is used to monitor your Pacemaker of ICD from home. This monitoring reduces the number of office visits required to check your device to one time per year. It allows [place] to keep an eye on the functioning of your device to ensure it is working properly. You are scheduled for a device check from home on 02/25/2015. You may send your transmission at any time that day. If you have a wireless device, the transmission will be sent automatically. After your physician reviews your transmission, you will receive a postcard with your next transmission date.  Your physician wants you to follow-up in: 1 year with Chanetta Marshall, NP. You will receive a reminder letter in the mail two months in advance. If you don't receive a letter, please call our office to schedule the follow-up appointment.  Any Other Special Instructions Will Be Listed Below (If Applicable). Thank you for choosing [facility]!!

## 2014-11-26 NOTE — Progress Notes (Signed)
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  His initial PPM was implanted 05/10/2000.  He underwent PPM generator change (MDT) 09/03/2009. He has rare afib with symptoms of palpitations.  Today, he  denies symptoms of palpitations, chest pain, shortness of breath, orthopnea, PND, lower extremity edema, dizziness, presyncope, syncope, or neurologic sequela.  He has OSA for which he uses CPAP.  The patientis tolerating medications without difficulties and is otherwise without complaint today.   Past Medical History  Diagnosis Date  . Diabetes mellitus without complication ([place])   . MI (myocardial infarction) ([place]) 1998  . Gout   . CAD (coronary artery disease)     s/p stent placement  . CKD (chronic kidney disease), stage III   . OSA on CPAP   . Insomnia   . Colon polyps   . SSS (sick sinus syndrome) ([place])   . NSTEMI (non-ST elevated myocardial infarction) ([place])     s/p PCI in early 2000s  . HTN (hypertension)   . HLD (hyperlipidemia)   . Type II or unspecified type diabetes mellitus without mention of complication, not stated as uncontrolled    Past Surgical History  Procedure Laterality Date  . Coronary angioplasty with stent placement      x4  . Insert / replace / remove pacemaker  05/10/2000    pacemaker implanted in [place]  . Partial colon removal  2/2    polyp that could not be removed on colonoscopy  . Hernia repair      ventral hernia repair with mesh--revision also was required  . Appendectomy    . Cataract extraction    . Pacemaker generator change  09/03/09    MDT Adapta generator change in [place] History  . Marital Status: Married    Spouse Name: N/A  . Number of Children: N/A  . Years of Education: N/A   Occupational History  . Not on file.   Social  History Main Topics  . Smoking status: Never Smoker   . Smokeless tobacco: Not on file  . Alcohol Use: No  . Drug Use: No  . Sexual Activity: Not on file   Other Topics Concern  . Not on file   Social History Narrative    Family History  Problem Relation Age of Onset  . Heart attack Father 49  . Heart disease Father   . Breast cancer Sister   . Pulmonary embolism Sister   . Deep vein thrombosis Sister     Allergies  Allergen Reactions  . Shellfish Allergy Rash    Current Outpatient Prescriptions  Medication Sig Dispense Refill  . allopurinol (ZYLOPRIM) 100 MG tablet Take 100 mg by mouth 2 (two) times daily.    Marland Kitchen amiodarone (PACERONE) 200 MG tablet Take 200 mg by mouth daily.    . colchicine 0.6 MG tablet Take 0.6 mg by mouth as needed (gout).     . dabigatran (PRADAXA) 75 MG CAPS capsule Take 1 capsule (75 mg total) by mouth 2 (two) times daily. 60 capsule 11  . diltiazem (CARDIZEM) 60 MG tablet Take 60 mg by mouth 4 (four) times daily as needed (palpitations).     . isosorbide dinitrate (ISORDIL) 30 MG tablet Take 30 mg by mouth daily.    Marland Kitchen  Melatonin 10 MG TABS Take 1 tablet by mouth at bedtime as needed (sleep).     . metoprolol succinate (TOPROL-XL) 100 MG 24 hr tablet Take 50 mg by mouth 2 (two) times daily. Take with or immediately following a meal.    . nitroGLYCERIN (NITROSTAT) 0.4 MG SL tablet Place 1 tablet (0.4 mg total) under the tongue every 5 (five) minutes as needed for chest pain. 45 tablet 6  . simvastatin (ZOCOR) 20 MG tablet Take 10 mg by mouth daily.     . sitaGLIPtin (JANUVIA) 50 MG tablet Take 50 mg by mouth daily.    . traZODone (DESYREL) 50 MG tablet Take 50 mg by mouth at bedtime.     No current facility-administered medications for this visit.    ROS- all systems are reviewed and negative except as per HPI  Physical Exam: Filed Vitals:   11/26/14 1211  BP: 134/88  Pulse: 67  Height: 5\' 11"  (1.803 m)  Weight: 232 lb 3.2 oz (105.325 kg)     GEN- The patient is well appearing, alert and oriented x 3 today.   Head- normocephalic, atraumatic Eyes-  Sclera clear, conjunctiva pink Ears- hearing intact Oropharynx- clear Neck- supple, no JVP Lymph- no cervical lymphadenopathy Lungs- Clear to ausculation bilaterally, normal work of breathing Chest- pacemaker pocket is well healed Heart- Regular rate and rhythm, no murmurs, rubs or gallops, PMI not laterally displaced GI- soft, NT, ND, + BS Extremities- no clubbing, cyanosis, or edema MS- no significant deformity or atrophy Neuro- strength and sensation are intact  Pacemaker interrogation- reviewed in detail today,  See PACEART report ekg today reveals atrial pacing 67 bpm, inferior infarction  Assessment and Plan:  1. Sick sinus syndrome Normal pacemaker function See Pace Art report No changes today  2. Atrial fibrillation Well controlled Continue pradaxa. PCP to follow creatinine clearance for dosing Continue amiodarone PCP to follow LFTs and TFTs  3. CAD Stable No change required today  Has [employer] Smart for remote monitoring Follow-up with Dr [name] in 6 months Return to see EP NP in 1 year

## 2014-12-27 DIAGNOSIS — Z125 Encounter for screening for malignant neoplasm of prostate: Secondary | ICD-10-CM | POA: Diagnosis not present

## 2014-12-27 DIAGNOSIS — E1165 Type 2 diabetes mellitus with hyperglycemia: Secondary | ICD-10-CM | POA: Diagnosis not present

## 2014-12-27 DIAGNOSIS — I2510 Atherosclerotic heart disease of native coronary artery without angina pectoris: Secondary | ICD-10-CM | POA: Diagnosis not present

## 2014-12-27 DIAGNOSIS — M109 Gout, unspecified: Secondary | ICD-10-CM | POA: Diagnosis not present

## 2014-12-27 DIAGNOSIS — I10 Essential (primary) hypertension: Secondary | ICD-10-CM | POA: Diagnosis not present

## 2014-12-27 DIAGNOSIS — R972 Elevated prostate specific antigen [PSA]: Secondary | ICD-10-CM | POA: Diagnosis not present

## 2015-01-03 DIAGNOSIS — M109 Gout, unspecified: Secondary | ICD-10-CM | POA: Diagnosis not present

## 2015-01-03 DIAGNOSIS — E1121 Type 2 diabetes mellitus with diabetic nephropathy: Secondary | ICD-10-CM | POA: Diagnosis not present

## 2015-01-03 DIAGNOSIS — N184 Chronic kidney disease, stage 4 (severe): Secondary | ICD-10-CM | POA: Diagnosis not present

## 2015-01-03 DIAGNOSIS — D692 Other nonthrombocytopenic purpura: Secondary | ICD-10-CM | POA: Diagnosis not present

## 2015-01-03 DIAGNOSIS — Z0000 Encounter for general adult medical examination without abnormal findings: Secondary | ICD-10-CM | POA: Diagnosis not present

## 2015-01-03 DIAGNOSIS — Z6832 Body mass index (BMI) 32.0-32.9, adult: Secondary | ICD-10-CM | POA: Diagnosis not present

## 2015-01-03 DIAGNOSIS — E784 Other hyperlipidemia: Secondary | ICD-10-CM | POA: Diagnosis not present

## 2015-01-03 DIAGNOSIS — Z1389 Encounter for screening for other disorder: Secondary | ICD-10-CM | POA: Diagnosis not present

## 2015-01-03 DIAGNOSIS — R972 Elevated prostate specific antigen [PSA]: Secondary | ICD-10-CM | POA: Diagnosis not present

## 2015-01-03 DIAGNOSIS — R740 Nonspecific elevation of levels of transaminase and lactic acid dehydrogenase [LDH]: Secondary | ICD-10-CM | POA: Diagnosis not present

## 2015-01-03 DIAGNOSIS — I10 Essential (primary) hypertension: Secondary | ICD-10-CM | POA: Diagnosis not present

## 2015-01-03 DIAGNOSIS — I129 Hypertensive chronic kidney disease with stage 1 through stage 4 chronic kidney disease, or unspecified chronic kidney disease: Secondary | ICD-10-CM | POA: Diagnosis not present

## 2015-01-09 DIAGNOSIS — Z1212 Encounter for screening for malignant neoplasm of rectum: Secondary | ICD-10-CM | POA: Diagnosis not present

## 2015-01-29 ENCOUNTER — Other Ambulatory Visit: Payer: Self-pay | Admitting: Cardiovascular Disease

## 2015-01-31 DIAGNOSIS — I10 Essential (primary) hypertension: Secondary | ICD-10-CM | POA: Diagnosis not present

## 2015-01-31 DIAGNOSIS — N184 Chronic kidney disease, stage 4 (severe): Secondary | ICD-10-CM | POA: Diagnosis not present

## 2015-01-31 DIAGNOSIS — E1165 Type 2 diabetes mellitus with hyperglycemia: Secondary | ICD-10-CM | POA: Diagnosis not present

## 2015-02-25 ENCOUNTER — Ambulatory Visit (INDEPENDENT_AMBULATORY_CARE_PROVIDER_SITE_OTHER): Payer: Medicare Other | Admitting: *Deleted

## 2015-02-25 ENCOUNTER — Telehealth: Payer: Self-pay | Admitting: Cardiology

## 2015-02-25 DIAGNOSIS — I495 Sick sinus syndrome: Secondary | ICD-10-CM | POA: Diagnosis not present

## 2015-02-25 NOTE — Telephone Encounter (Signed)
Confirmed remote transmission w/ pt wife.   

## 2015-02-26 NOTE — Progress Notes (Signed)
Remote pacemaker transmission.   

## 2015-03-07 DIAGNOSIS — Z6833 Body mass index (BMI) 33.0-33.9, adult: Secondary | ICD-10-CM | POA: Diagnosis not present

## 2015-03-07 DIAGNOSIS — G4700 Insomnia, unspecified: Secondary | ICD-10-CM | POA: Diagnosis not present

## 2015-03-07 DIAGNOSIS — N184 Chronic kidney disease, stage 4 (severe): Secondary | ICD-10-CM | POA: Diagnosis not present

## 2015-03-07 DIAGNOSIS — I10 Essential (primary) hypertension: Secondary | ICD-10-CM | POA: Diagnosis not present

## 2015-03-07 DIAGNOSIS — E1165 Type 2 diabetes mellitus with hyperglycemia: Secondary | ICD-10-CM | POA: Diagnosis not present

## 2015-03-10 LAB — CUP PACEART REMOTE DEVICE CHECK
Battery Impedance: 672 Ohm
Battery Remaining Longevity: 75 mo
Battery Voltage: 2.8 V
Brady Statistic AP VP Percent: 0 %
Brady Statistic AS VP Percent: 0 %
Brady Statistic AS VS Percent: 1 %
Implantable Lead Implant Date: 20020325
Implantable Lead Location: 753862
Implantable Lead Location: 753862
Implantable Lead Model: 4092
Implantable Lead Model: 4524
Lead Channel Impedance Value: 539 Ohm
Lead Channel Pacing Threshold Amplitude: 0.625 V
Lead Channel Pacing Threshold Amplitude: 1.125 V
Lead Channel Pacing Threshold Pulse Width: 0.4 ms
Lead Channel Sensing Intrinsic Amplitude: 16 mV
Lead Channel Setting Pacing Amplitude: 2.5 V
Lead Channel Setting Pacing Pulse Width: 0.4 ms
MDC IDC LEAD IMPLANT DT: 20020325
MDC IDC MSMT LEADCHNL RV IMPEDANCE VALUE: 686 Ohm
MDC IDC MSMT LEADCHNL RV PACING THRESHOLD PULSEWIDTH: 0.4 ms
MDC IDC SESS DTM: 20170109184042
MDC IDC SET LEADCHNL RA PACING AMPLITUDE: 2.25 V
MDC IDC SET LEADCHNL RV SENSING SENSITIVITY: 5.6 mV
MDC IDC STAT BRADY AP VS PERCENT: 99 %

## 2015-03-15 ENCOUNTER — Encounter: Payer: Self-pay | Admitting: Cardiology

## 2015-04-16 DIAGNOSIS — N184 Chronic kidney disease, stage 4 (severe): Secondary | ICD-10-CM | POA: Diagnosis not present

## 2015-04-16 DIAGNOSIS — R972 Elevated prostate specific antigen [PSA]: Secondary | ICD-10-CM | POA: Diagnosis not present

## 2015-04-16 DIAGNOSIS — Z6833 Body mass index (BMI) 33.0-33.9, adult: Secondary | ICD-10-CM | POA: Diagnosis not present

## 2015-04-16 DIAGNOSIS — E784 Other hyperlipidemia: Secondary | ICD-10-CM | POA: Diagnosis not present

## 2015-04-16 DIAGNOSIS — I10 Essential (primary) hypertension: Secondary | ICD-10-CM | POA: Diagnosis not present

## 2015-04-16 DIAGNOSIS — E1165 Type 2 diabetes mellitus with hyperglycemia: Secondary | ICD-10-CM | POA: Diagnosis not present

## 2015-04-16 DIAGNOSIS — I129 Hypertensive chronic kidney disease with stage 1 through stage 4 chronic kidney disease, or unspecified chronic kidney disease: Secondary | ICD-10-CM | POA: Diagnosis not present

## 2015-05-15 DIAGNOSIS — E1165 Type 2 diabetes mellitus with hyperglycemia: Secondary | ICD-10-CM | POA: Diagnosis not present

## 2015-05-15 DIAGNOSIS — I10 Essential (primary) hypertension: Secondary | ICD-10-CM | POA: Diagnosis not present

## 2015-05-15 DIAGNOSIS — N184 Chronic kidney disease, stage 4 (severe): Secondary | ICD-10-CM | POA: Diagnosis not present

## 2015-05-15 DIAGNOSIS — Z6834 Body mass index (BMI) 34.0-34.9, adult: Secondary | ICD-10-CM | POA: Diagnosis not present

## 2015-05-27 ENCOUNTER — Telehealth: Payer: Self-pay | Admitting: Cardiology

## 2015-05-27 ENCOUNTER — Ambulatory Visit (INDEPENDENT_AMBULATORY_CARE_PROVIDER_SITE_OTHER): Payer: Medicare Other | Admitting: *Deleted

## 2015-05-27 DIAGNOSIS — I495 Sick sinus syndrome: Secondary | ICD-10-CM

## 2015-05-27 NOTE — Telephone Encounter (Signed)
Spoke with pt and reminded pt of remote transmission that is due today. Pt verbalized understanding.   

## 2015-05-27 NOTE — Progress Notes (Signed)
Remote pacemaker transmission.   

## 2015-06-18 LAB — CUP PACEART REMOTE DEVICE CHECK
Battery Voltage: 2.79 V
Brady Statistic AP VP Percent: 0 %
Brady Statistic AP VS Percent: 99 %
Brady Statistic AS VP Percent: 0 %
Brady Statistic AS VS Percent: 1 %
Date Time Interrogation Session: 20170410145606
Implantable Lead Implant Date: 20020325
Implantable Lead Location: 753862
Implantable Lead Location: 753862
Implantable Lead Model: 4092
Lead Channel Pacing Threshold Amplitude: 0.625 V
Lead Channel Pacing Threshold Pulse Width: 0.4 ms
Lead Channel Setting Pacing Amplitude: 2.5 V
Lead Channel Setting Pacing Pulse Width: 0.4 ms
Lead Channel Setting Sensing Sensitivity: 5.6 mV
MDC IDC LEAD IMPLANT DT: 20020325
MDC IDC MSMT BATTERY IMPEDANCE: 776 Ohm
MDC IDC MSMT BATTERY REMAINING LONGEVITY: 66 mo
MDC IDC MSMT LEADCHNL RA IMPEDANCE VALUE: 563 Ohm
MDC IDC MSMT LEADCHNL RA PACING THRESHOLD AMPLITUDE: 1 V
MDC IDC MSMT LEADCHNL RA PACING THRESHOLD PULSEWIDTH: 0.4 ms
MDC IDC MSMT LEADCHNL RV IMPEDANCE VALUE: 735 Ohm
MDC IDC MSMT LEADCHNL RV SENSING INTR AMPL: 16 mV
MDC IDC SET LEADCHNL RA PACING AMPLITUDE: 2 V

## 2015-06-26 ENCOUNTER — Encounter: Payer: Self-pay | Admitting: Cardiology

## 2015-07-09 DIAGNOSIS — H25812 Combined forms of age-related cataract, left eye: Secondary | ICD-10-CM | POA: Diagnosis not present

## 2015-07-09 DIAGNOSIS — Z961 Presence of intraocular lens: Secondary | ICD-10-CM | POA: Diagnosis not present

## 2015-07-09 DIAGNOSIS — E119 Type 2 diabetes mellitus without complications: Secondary | ICD-10-CM | POA: Diagnosis not present

## 2015-07-09 DIAGNOSIS — Z9841 Cataract extraction status, right eye: Secondary | ICD-10-CM | POA: Diagnosis not present

## 2015-07-18 DIAGNOSIS — H2512 Age-related nuclear cataract, left eye: Secondary | ICD-10-CM | POA: Diagnosis not present

## 2015-07-18 DIAGNOSIS — E119 Type 2 diabetes mellitus without complications: Secondary | ICD-10-CM | POA: Diagnosis not present

## 2015-07-18 DIAGNOSIS — H35371 Puckering of macula, right eye: Secondary | ICD-10-CM | POA: Diagnosis not present

## 2015-07-18 DIAGNOSIS — G4730 Sleep apnea, unspecified: Secondary | ICD-10-CM | POA: Diagnosis not present

## 2015-08-07 DIAGNOSIS — Z6835 Body mass index (BMI) 35.0-35.9, adult: Secondary | ICD-10-CM | POA: Diagnosis not present

## 2015-08-07 DIAGNOSIS — N184 Chronic kidney disease, stage 4 (severe): Secondary | ICD-10-CM | POA: Diagnosis not present

## 2015-08-07 DIAGNOSIS — I10 Essential (primary) hypertension: Secondary | ICD-10-CM | POA: Diagnosis not present

## 2015-08-07 DIAGNOSIS — E1165 Type 2 diabetes mellitus with hyperglycemia: Secondary | ICD-10-CM | POA: Diagnosis not present

## 2015-08-26 ENCOUNTER — Telehealth: Payer: Self-pay | Admitting: Cardiology

## 2015-08-26 ENCOUNTER — Ambulatory Visit (INDEPENDENT_AMBULATORY_CARE_PROVIDER_SITE_OTHER): Payer: Medicare Other | Admitting: *Deleted

## 2015-08-26 DIAGNOSIS — I495 Sick sinus syndrome: Secondary | ICD-10-CM | POA: Diagnosis not present

## 2015-08-26 NOTE — Progress Notes (Signed)
Remote pacemaker transmission.   

## 2015-08-26 NOTE — Telephone Encounter (Signed)
Spoke with pt and reminded pt of remote transmission that is due today. Pt verbalized understanding.   

## 2015-08-27 LAB — CUP PACEART REMOTE DEVICE CHECK
Battery Remaining Longevity: 64 mo
Battery Voltage: 2.79 V
Brady Statistic AP VP Percent: 0 %
Brady Statistic AS VP Percent: 0 %
Date Time Interrogation Session: 20170710170346
Implantable Lead Implant Date: 20020325
Implantable Lead Location: 753862
Implantable Lead Model: 4092
Implantable Lead Model: 4524
Lead Channel Impedance Value: 546 Ohm
Lead Channel Impedance Value: 697 Ohm
Lead Channel Pacing Threshold Amplitude: 1.125 V
Lead Channel Pacing Threshold Pulse Width: 0.4 ms
Lead Channel Pacing Threshold Pulse Width: 0.4 ms
Lead Channel Setting Pacing Amplitude: 2.5 V
MDC IDC LEAD IMPLANT DT: 20020325
MDC IDC LEAD LOCATION: 753862
MDC IDC MSMT BATTERY IMPEDANCE: 799 Ohm
MDC IDC MSMT LEADCHNL RV PACING THRESHOLD AMPLITUDE: 0.5 V
MDC IDC MSMT LEADCHNL RV SENSING INTR AMPL: 16 mV
MDC IDC SET LEADCHNL RA PACING AMPLITUDE: 2.25 V
MDC IDC SET LEADCHNL RV PACING PULSEWIDTH: 0.4 ms
MDC IDC SET LEADCHNL RV SENSING SENSITIVITY: 5.6 mV
MDC IDC STAT BRADY AP VS PERCENT: 99 %
MDC IDC STAT BRADY AS VS PERCENT: 1 %

## 2015-08-28 ENCOUNTER — Encounter: Payer: Self-pay | Admitting: Cardiology

## 2015-11-15 DIAGNOSIS — Z23 Encounter for immunization: Secondary | ICD-10-CM | POA: Diagnosis not present

## 2015-12-24 DIAGNOSIS — Z6834 Body mass index (BMI) 34.0-34.9, adult: Secondary | ICD-10-CM | POA: Diagnosis not present

## 2015-12-24 DIAGNOSIS — N184 Chronic kidney disease, stage 4 (severe): Secondary | ICD-10-CM | POA: Diagnosis not present

## 2015-12-24 DIAGNOSIS — E1165 Type 2 diabetes mellitus with hyperglycemia: Secondary | ICD-10-CM | POA: Diagnosis not present

## 2015-12-24 DIAGNOSIS — I10 Essential (primary) hypertension: Secondary | ICD-10-CM | POA: Diagnosis not present

## 2016-01-01 DIAGNOSIS — E1165 Type 2 diabetes mellitus with hyperglycemia: Secondary | ICD-10-CM | POA: Diagnosis not present

## 2016-01-01 DIAGNOSIS — E784 Other hyperlipidemia: Secondary | ICD-10-CM | POA: Diagnosis not present

## 2016-01-01 DIAGNOSIS — I10 Essential (primary) hypertension: Secondary | ICD-10-CM | POA: Diagnosis not present

## 2016-01-01 DIAGNOSIS — Z125 Encounter for screening for malignant neoplasm of prostate: Secondary | ICD-10-CM | POA: Diagnosis not present

## 2016-01-01 DIAGNOSIS — R8299 Other abnormal findings in urine: Secondary | ICD-10-CM | POA: Diagnosis not present

## 2016-01-01 DIAGNOSIS — M109 Gout, unspecified: Secondary | ICD-10-CM | POA: Diagnosis not present

## 2016-01-06 DIAGNOSIS — Z0000 Encounter for general adult medical examination without abnormal findings: Secondary | ICD-10-CM | POA: Diagnosis not present

## 2016-01-06 DIAGNOSIS — D692 Other nonthrombocytopenic purpura: Secondary | ICD-10-CM | POA: Diagnosis not present

## 2016-01-06 DIAGNOSIS — N184 Chronic kidney disease, stage 4 (severe): Secondary | ICD-10-CM | POA: Diagnosis not present

## 2016-01-06 DIAGNOSIS — Z23 Encounter for immunization: Secondary | ICD-10-CM | POA: Diagnosis not present

## 2016-01-06 DIAGNOSIS — E1121 Type 2 diabetes mellitus with diabetic nephropathy: Secondary | ICD-10-CM | POA: Diagnosis not present

## 2016-01-06 DIAGNOSIS — E875 Hyperkalemia: Secondary | ICD-10-CM | POA: Diagnosis not present

## 2016-01-06 DIAGNOSIS — Z1389 Encounter for screening for other disorder: Secondary | ICD-10-CM | POA: Diagnosis not present

## 2016-01-06 DIAGNOSIS — E784 Other hyperlipidemia: Secondary | ICD-10-CM | POA: Diagnosis not present

## 2016-01-06 DIAGNOSIS — Z6834 Body mass index (BMI) 34.0-34.9, adult: Secondary | ICD-10-CM | POA: Diagnosis not present

## 2016-01-06 DIAGNOSIS — E1165 Type 2 diabetes mellitus with hyperglycemia: Secondary | ICD-10-CM | POA: Diagnosis not present

## 2016-01-06 DIAGNOSIS — I10 Essential (primary) hypertension: Secondary | ICD-10-CM | POA: Diagnosis not present

## 2016-01-06 DIAGNOSIS — R972 Elevated prostate specific antigen [PSA]: Secondary | ICD-10-CM | POA: Diagnosis not present

## 2016-02-07 ENCOUNTER — Other Ambulatory Visit: Payer: Self-pay | Admitting: Cardiovascular Disease

## 2016-02-12 ENCOUNTER — Telehealth: Payer: Self-pay | Admitting: *Deleted

## 2016-02-12 ENCOUNTER — Other Ambulatory Visit: Payer: Self-pay | Admitting: *Deleted

## 2016-02-12 MED ORDER — DABIGATRAN ETEXILATE MESYLATE 75 MG PO CAPS: ORAL_CAPSULE | ORAL | 0 refills | Status: DC

## 2016-02-12 NOTE — Telephone Encounter (Signed)
CALLED TO INFORM PT PRADAXA WAS SENT TO PHARMACY AND HE NEEDS TO MAKE AN APPT. PT AWARE & DOES NOT WANT TO MAKE AN APPT. WILL ONLY GET 2 MORE WARNINGS BEFORE WE CANT GIVE HIM ANYMORE REFILLS.

## 2016-02-12 NOTE — Telephone Encounter (Signed)
CALLED PT BACK AS BEFORE I COULD EXPLAIN THAT IF HE MADE THE APPT THEN HE COULD GET REFILLS TO THAT DATE. PT AWARE, WILL MAKE APPT NOW THAT HE ACTUALLY UNDERSTOOD WHAT I WAS SAYING TO HIM.

## 2016-02-26 DIAGNOSIS — N184 Chronic kidney disease, stage 4 (severe): Secondary | ICD-10-CM | POA: Diagnosis not present

## 2016-02-26 DIAGNOSIS — I10 Essential (primary) hypertension: Secondary | ICD-10-CM | POA: Diagnosis not present

## 2016-02-26 DIAGNOSIS — E1165 Type 2 diabetes mellitus with hyperglycemia: Secondary | ICD-10-CM | POA: Diagnosis not present

## 2016-02-26 DIAGNOSIS — Z6835 Body mass index (BMI) 35.0-35.9, adult: Secondary | ICD-10-CM | POA: Diagnosis not present

## 2016-03-12 ENCOUNTER — Other Ambulatory Visit: Payer: Self-pay | Admitting: Internal Medicine

## 2016-03-27 ENCOUNTER — Encounter: Payer: Self-pay | Admitting: Internal Medicine

## 2016-04-01 ENCOUNTER — Ambulatory Visit (INDEPENDENT_AMBULATORY_CARE_PROVIDER_SITE_OTHER): Payer: Medicare Other | Admitting: Internal Medicine

## 2016-04-01 ENCOUNTER — Encounter: Payer: Self-pay | Admitting: Internal Medicine

## 2016-04-01 VITALS — BP 138/80 | HR 81 | Ht 71.0 in | Wt 253.4 lb

## 2016-04-01 DIAGNOSIS — Z45018 Encounter for adjustment and management of other part of cardiac pacemaker: Secondary | ICD-10-CM

## 2016-04-01 DIAGNOSIS — I495 Sick sinus syndrome: Secondary | ICD-10-CM | POA: Diagnosis not present

## 2016-04-01 DIAGNOSIS — I480 Paroxysmal atrial fibrillation: Secondary | ICD-10-CM | POA: Diagnosis not present

## 2016-04-01 MED ORDER — DABIGATRAN ETEXILATE MESYLATE 75 MG PO CAPS: 75.0000 mg | ORAL_CAPSULE | Freq: Two times a day (BID) | ORAL | 3 refills | Status: DC

## 2016-04-01 NOTE — Patient Instructions (Signed)
Medication Instructions:  Your physician recommends that you continue on your current medications as directed. Please refer to the Current Medication list given to you today.   Labwork: Your physician recommends that you return for lab work today: Liver/TSH/T4/CBC/BMP   Testing/Procedures: None ordered   Follow-Up: Remote monitoring is used to monitor your Pacemaker from home. This monitoring reduces the number of office visits required to check your device to one time per year. It allows [place] to keep an eye on the functioning of your device to ensure it is working properly. You are scheduled for a device check from home on 07/01/16. You may send your transmission at any time that day. If you have a wireless device, the transmission will be sent automatically. After your physician reviews your transmission, you will receive a postcard with your next transmission date.  Your physician wants you to follow-up in: 6 months with Dr [name] and 12 months with Tommye Standard, PA You will receive a reminder letter in the mail two months in advance. If you don't receive a letter, please call our office to schedule the follow-up appointment.     Any Other Special Instructions Will Be Listed Below (If Applicable).     If you need a refill on your cardiac medications before your next appointment, please call your pharmacy.

## 2016-04-01 NOTE — Progress Notes (Signed)
Bradley Hatchet, MD is PCP Primary Cardiologist:  Dr Edwinna Areola is a 76 y.o. male with a h/o sick sinus syndrome sp PPM (MDT) in [place] who presents today to follow-up in the Electrophysiology device clinic.  His initial PPM was implanted 05/10/2000.  He underwent PPM generator change (MDT) 09/03/2009. He has rare afib with symptoms of palpitations.  Today, he  denies symptoms of palpitations, chest pain, shortness of breath, orthopnea, PND, lower extremity edema, dizziness, presyncope, syncope, or neurologic sequela.  He has OSA for which he uses CPAP.  The patientis tolerating medications without difficulties and is otherwise without complaint today.   Past Medical History:  Diagnosis Date  . CAD (coronary artery disease)    s/p stent placement  . CKD (chronic kidney disease), stage III   . Colon polyps   . Diabetes mellitus without complication ([place])   . Gout   . HLD (hyperlipidemia)   . HTN (hypertension)   . Insomnia   . MI (myocardial infarction) 1998  . NSTEMI (non-ST elevated myocardial infarction) ([place])    s/p PCI in early 2000s  . OSA on CPAP   . SSS (sick sinus syndrome) ([place])   . Type II or unspecified type diabetes mellitus without mention of complication, not stated as uncontrolled    Past Surgical History:  Procedure Laterality Date  . APPENDECTOMY    . CATARACT EXTRACTION    . CORONARY ANGIOPLASTY WITH STENT PLACEMENT     x4  . HERNIA REPAIR     ventral hernia repair with mesh--revision also was required  . INSERT / REPLACE / REMOVE PACEMAKER  05/10/2000   pacemaker implanted in [place]  . PACEMAKER GENERATOR CHANGE  09/03/09   MDT Adapta generator change in [place]  . PARTIAL COLON REMOVAL  2/2   polyp that could not be removed on colonoscopy    Social History   Social History  . Marital status: Married    Spouse name: N/A  . Number of children: N/A  . Years of education: N/A   Occupational History  . Not on file.   Social History  Main Topics  . Smoking status: Never Smoker  . Smokeless tobacco: Never Used  . Alcohol use No  . Drug use: No  . Sexual activity: Not on file   Other Topics Concern  . Not on file   Social History Narrative  . No narrative on file    Family History  Problem Relation Age of Onset  . Heart attack Father 66  . Heart disease Father   . Breast cancer Sister   . Pulmonary embolism Sister   . Deep vein thrombosis Sister     Allergies  Allergen Reactions  . Shellfish Allergy Rash    Current Outpatient Prescriptions  Medication Sig Dispense Refill  . allopurinol (ZYLOPRIM) 100 MG tablet Take 100 mg by mouth 2 (two) times daily.    Marland Kitchen amiodarone (PACERONE) 200 MG tablet Take 200 mg by mouth daily.    . colchicine 0.6 MG tablet Take 0.6 mg by mouth as needed (gout).     Marland Kitchen diltiazem (CARDIZEM) 60 MG tablet Take 60 mg by mouth 4 (four) times daily as needed (palpitations).     . isosorbide dinitrate (ISORDIL) 30 MG tablet Take 30 mg by mouth daily.    . Melatonin 10 MG TABS Take 1 tablet by mouth at bedtime as needed (sleep).     . metoprolol succinate (TOPROL-XL) 100 MG 24 hr tablet  Take 50 mg by mouth 2 (two) times daily. Take with or immediately following a meal.    . nitroGLYCERIN (NITROSTAT) 0.4 MG SL tablet Place 1 tablet (0.4 mg total) under the tongue every 5 (five) minutes as needed for chest pain. 45 tablet 6  . NOVOLOG MIX 70/30 FLEXPEN (70-30) 100 UNIT/ML FlexPen Inject 28 Units into the skin 2 (two) times daily with a meal.  3  . olmesartan (BENICAR) 20 MG tablet Take 20 mg by mouth daily.    Marland Kitchen PRADAXA 75 MG CAPS capsule TAKE ONE CAPSULE BY MOUTH TWICE A DAY 42 capsule 0  . simvastatin (ZOCOR) 20 MG tablet Take 10 mg by mouth daily.     . traZODone (DESYREL) 50 MG tablet Take 50 mg by mouth at bedtime.     No current facility-administered medications for this visit.     ROS- all systems are reviewed and negative except as per HPI  Physical Exam: Vitals:   04/01/16  1213  BP: 138/80  Pulse: 81  Weight: 253 lb 6.4 oz (114.9 kg)  Height: 5\' 11"  (1.803 m)    GEN- The patient is well appearing, alert and oriented x 3 today.   Head- normocephalic, atraumatic Eyes-  Sclera clear, conjunctiva pink Ears- hearing intact Oropharynx- clear Neck- supple, no JVP Lymph- no cervical lymphadenopathy Lungs- Clear to ausculation bilaterally, normal work of breathing Chest- pacemaker pocket is well healed Heart- Regular rate and rhythm, no murmurs, rubs or gallops, PMI not laterally displaced GI- soft, NT, ND, + BS Extremities- no clubbing, cyanosis, or edema MS- no significant deformity or atrophy Neuro- strength and sensation are intact  Pacemaker interrogation- reviewed in detail today,  See PACEART report ekg today reveals atrial pacing 67 bpm, inferior infarction  Assessment and Plan:  1. Sick sinus syndrome Normal pacemaker function See Pace Art report No changes today  2. Atrial fibrillation Well controlled Continue pradaxa. PCP to follow creatinine clearance for dosing Continue amiodarone PCP to follow LFTs and TFTs  3. CAD Stable No change required today  Has My[employer]link Smart for remote monitoring Follow-up with Dr [name] in 6 months Return to see EP NP in 1 year

## 2016-04-02 ENCOUNTER — Telehealth: Payer: Self-pay | Admitting: Internal Medicine

## 2016-04-02 DIAGNOSIS — R7989 Other specified abnormal findings of blood chemistry: Secondary | ICD-10-CM

## 2016-04-02 DIAGNOSIS — E875 Hyperkalemia: Secondary | ICD-10-CM

## 2016-04-02 LAB — CBC WITH DIFFERENTIAL/PLATELET
BASOS ABS: 0 10*3/uL (ref 0.0–0.2)
Basos: 0 %
EOS (ABSOLUTE): 0.5 10*3/uL — AB (ref 0.0–0.4)
Eos: 8 %
Hematocrit: 40.8 % (ref 37.5–51.0)
Hemoglobin: 13.5 g/dL (ref 13.0–17.7)
Immature Grans (Abs): 0 10*3/uL (ref 0.0–0.1)
Immature Granulocytes: 0 %
LYMPHS ABS: 1.6 10*3/uL (ref 0.7–3.1)
Lymphs: 24 %
MCH: 30.5 pg (ref 26.6–33.0)
MCHC: 33.1 g/dL (ref 31.5–35.7)
MCV: 92 fL (ref 79–97)
MONOS ABS: 0.6 10*3/uL (ref 0.1–0.9)
Monocytes: 9 %
NEUTROS ABS: 4 10*3/uL (ref 1.4–7.0)
Neutrophils: 59 %
PLATELETS: 130 10*3/uL — AB (ref 150–379)
RBC: 4.43 x10E6/uL (ref 4.14–5.80)
RDW: 14.9 % (ref 12.3–15.4)
WBC: 6.8 10*3/uL (ref 3.4–10.8)

## 2016-04-02 LAB — HEPATIC FUNCTION PANEL
ALK PHOS: 103 IU/L (ref 39–117)
ALT: 46 IU/L — ABNORMAL HIGH (ref 0–44)
AST: 31 IU/L (ref 0–40)
Albumin: 4 g/dL (ref 3.5–4.8)
Bilirubin Total: 0.4 mg/dL (ref 0.0–1.2)
Bilirubin, Direct: 0.15 mg/dL (ref 0.00–0.40)
Total Protein: 6.4 g/dL (ref 6.0–8.5)

## 2016-04-02 LAB — T4, FREE: FREE T4: 1.13 ng/dL (ref 0.82–1.77)

## 2016-04-02 LAB — BASIC METABOLIC PANEL
BUN / CREAT RATIO: 17 (ref 10–24)
BUN: 59 mg/dL — ABNORMAL HIGH (ref 8–27)
CO2: 19 mmol/L (ref 18–29)
CREATININE: 3.38 mg/dL — AB (ref 0.76–1.27)
Calcium: 8.9 mg/dL (ref 8.6–10.2)
Chloride: 105 mmol/L (ref 96–106)
GFR calc Af Amer: 19 mL/min/{1.73_m2} — ABNORMAL LOW (ref >59)
GFR calc non Af Amer: 17 mL/min/{1.73_m2} — ABNORMAL LOW (ref >59)
GLUCOSE: 268 mg/dL — AB (ref 65–99)
Potassium: 6.1 mmol/L — ABNORMAL HIGH (ref 3.5–5.2)
SODIUM: 138 mmol/L (ref 134–144)

## 2016-04-02 LAB — TSH: TSH: 4.47 u[IU]/mL (ref 0.450–4.500)

## 2016-04-02 NOTE — Telephone Encounter (Signed)
Spoke with Dr. Marlou Porch, DOD, and he said to have pt stop Benicar and avoid K+ rich foods. Wants BMET repeated tomorrow and send labs to PCP. Spoke with pt and reviewed orders with him. He then asked that I give instructions to his wife. Advised wife of recommendations as well. Wife verbalized understanding and was in agreement with plan.

## 2016-04-03 ENCOUNTER — Other Ambulatory Visit: Payer: Medicare Other | Admitting: *Deleted

## 2016-04-03 DIAGNOSIS — I2510 Atherosclerotic heart disease of native coronary artery without angina pectoris: Secondary | ICD-10-CM

## 2016-04-03 DIAGNOSIS — I480 Paroxysmal atrial fibrillation: Secondary | ICD-10-CM | POA: Diagnosis not present

## 2016-04-03 LAB — BASIC METABOLIC PANEL
BUN/Creatinine Ratio: 16 (ref 10–24)
BUN: 52 mg/dL — ABNORMAL HIGH (ref 8–27)
CO2: 17 mmol/L — AB (ref 18–29)
Calcium: 8.3 mg/dL — ABNORMAL LOW (ref 8.6–10.2)
Chloride: 105 mmol/L (ref 96–106)
Creatinine, Ser: 3.2 mg/dL — ABNORMAL HIGH (ref 0.76–1.27)
GFR calc Af Amer: 21 mL/min/{1.73_m2} — ABNORMAL LOW (ref >59)
GFR calc non Af Amer: 18 mL/min/{1.73_m2} — ABNORMAL LOW (ref >59)
GLUCOSE: 335 mg/dL — AB (ref 65–99)
Potassium: 5.9 mmol/L — ABNORMAL HIGH (ref 3.5–5.2)
SODIUM: 139 mmol/L (ref 134–144)

## 2016-04-03 NOTE — Addendum Note (Signed)
Addended by: Eulis Foster on: 04/03/2016 09:57 AM   Modules accepted: Orders

## 2016-04-08 DIAGNOSIS — Z6835 Body mass index (BMI) 35.0-35.9, adult: Secondary | ICD-10-CM | POA: Diagnosis not present

## 2016-04-08 DIAGNOSIS — N184 Chronic kidney disease, stage 4 (severe): Secondary | ICD-10-CM | POA: Diagnosis not present

## 2016-04-08 DIAGNOSIS — D692 Other nonthrombocytopenic purpura: Secondary | ICD-10-CM | POA: Diagnosis not present

## 2016-04-08 DIAGNOSIS — I480 Paroxysmal atrial fibrillation: Secondary | ICD-10-CM | POA: Diagnosis not present

## 2016-04-08 DIAGNOSIS — I10 Essential (primary) hypertension: Secondary | ICD-10-CM | POA: Diagnosis not present

## 2016-04-08 DIAGNOSIS — E1165 Type 2 diabetes mellitus with hyperglycemia: Secondary | ICD-10-CM | POA: Diagnosis not present

## 2016-04-08 DIAGNOSIS — E784 Other hyperlipidemia: Secondary | ICD-10-CM | POA: Diagnosis not present

## 2016-04-10 LAB — CUP PACEART INCLINIC DEVICE CHECK
Battery Impedance: 931 Ohm
Brady Statistic AP VP Percent: 0 %
Brady Statistic AP VS Percent: 99 %
Brady Statistic AS VP Percent: 0 %
Brady Statistic AS VS Percent: 0 %
Brady Statistic RA Percent Paced: 99.6 %
Implantable Lead Implant Date: 20020325
Implantable Lead Implant Date: 20020325
Implantable Lead Location: 753859
Implantable Lead Model: 4524
Implantable Pulse Generator Implant Date: 20110719
Lead Channel Impedance Value: 547 Ohm
Lead Channel Impedance Value: 714 Ohm
Lead Channel Pacing Threshold Amplitude: 0.75 V
Lead Channel Pacing Threshold Pulse Width: 0.4 ms
Lead Channel Pacing Threshold Pulse Width: 0.4 ms
Lead Channel Sensing Intrinsic Amplitude: 1.4 mV
Lead Channel Sensing Intrinsic Amplitude: 22.4 mV
Lead Channel Setting Pacing Amplitude: 2.75 V
Lead Channel Setting Pacing Pulse Width: 0.4 ms
Lead Channel Setting Sensing Sensitivity: 5.6 mV
MDC IDC LEAD LOCATION: 753860
MDC IDC MSMT BATTERY REMAINING LONGEVITY: 52 mo
MDC IDC MSMT BATTERY VOLTAGE: 2.78 V
MDC IDC MSMT LEADCHNL RA PACING THRESHOLD AMPLITUDE: 1 V
MDC IDC SESS DTM: 20180214185129
MDC IDC SET LEADCHNL RV PACING AMPLITUDE: 2.5 V
MDC IDC STAT BRADY RV PERCENT PACED: 0.2 %

## 2016-04-17 ENCOUNTER — Telehealth: Payer: Self-pay | Admitting: *Deleted

## 2016-04-17 NOTE — Telephone Encounter (Signed)
Left message for patient to casll me back in regards to his labs

## 2016-04-17 NOTE — Telephone Encounter (Signed)
-----   Message from [name], MD sent at 04/05/2016  9:58 PM EST ----- Results reviewed.  [name], please inform pt of result. Glucose is very elevated.  He should follow-up with primary care for additional management. I will route to primary care also.

## 2016-04-28 ENCOUNTER — Telehealth: Payer: Self-pay | Admitting: Internal Medicine

## 2016-04-28 NOTE — Telephone Encounter (Signed)
I think he is ok to proceed. Will forward to pharmacist for anticoagulation protocol.  He is way overdue to see Dr [name] his primary cardiologist.  I think that he can proceed with the procedure however before scheduling follow-up with Dr [name].  [name] MD, [facility] 04/28/2016 6:49 PM

## 2016-04-28 NOTE — Telephone Encounter (Signed)
Will route clearance request to Dr Rayann Heman and covering RN for further review, clearance recommendation, and follow-up with Dr. Thurnell Lose office thereafter.

## 2016-04-28 NOTE — Telephone Encounter (Signed)
Request for surgical clearance:  What type of surgery is being performed? Single Tooth Extraction   1. When is this surgery scheduled? Pending    2. Are there any medications that need to be held prior to surgery and how long?Pradaxa for 48 hrs prior    3. Name of physician performing surgery? Dr. Thurnell Lose  4. What is your office phone and fax number? Ph# [phone], Fax# [phone]

## 2016-04-29 NOTE — Telephone Encounter (Signed)
Note faxed to Dr.William Ross @ [phone].

## 2016-04-29 NOTE — Telephone Encounter (Signed)
He is at low risk for tooth extraction. He needs to make a follow up appt with me.  May hold pradaxa for 2 days prior to procedure if requested by dentist

## 2016-05-25 DIAGNOSIS — R972 Elevated prostate specific antigen [PSA]: Secondary | ICD-10-CM | POA: Diagnosis not present

## 2016-06-08 ENCOUNTER — Encounter: Payer: Self-pay | Admitting: Cardiovascular Disease

## 2016-07-01 ENCOUNTER — Telehealth: Payer: Self-pay | Admitting: Cardiology

## 2016-07-01 ENCOUNTER — Encounter: Payer: Medicare Other | Admitting: *Deleted

## 2016-07-01 NOTE — Telephone Encounter (Signed)
LMOVM reminding pt to send remote transmission.   

## 2016-07-02 ENCOUNTER — Encounter: Payer: Self-pay | Admitting: Cardiology

## 2016-07-07 DIAGNOSIS — E1165 Type 2 diabetes mellitus with hyperglycemia: Secondary | ICD-10-CM | POA: Diagnosis not present

## 2016-07-07 DIAGNOSIS — Z6834 Body mass index (BMI) 34.0-34.9, adult: Secondary | ICD-10-CM | POA: Diagnosis not present

## 2016-07-07 DIAGNOSIS — I10 Essential (primary) hypertension: Secondary | ICD-10-CM | POA: Diagnosis not present

## 2016-07-07 DIAGNOSIS — G4700 Insomnia, unspecified: Secondary | ICD-10-CM | POA: Diagnosis not present

## 2016-07-07 DIAGNOSIS — N184 Chronic kidney disease, stage 4 (severe): Secondary | ICD-10-CM | POA: Diagnosis not present

## 2016-07-09 ENCOUNTER — Ambulatory Visit (INDEPENDENT_AMBULATORY_CARE_PROVIDER_SITE_OTHER): Payer: Medicare Other | Admitting: *Deleted

## 2016-07-09 DIAGNOSIS — I495 Sick sinus syndrome: Secondary | ICD-10-CM | POA: Diagnosis not present

## 2016-07-10 ENCOUNTER — Encounter: Payer: Self-pay | Admitting: Cardiology

## 2016-07-10 NOTE — Progress Notes (Signed)
Remote pacemaker transmission.   

## 2016-07-14 LAB — CUP PACEART REMOTE DEVICE CHECK
Battery Impedance: 1089 Ohm
Battery Remaining Longevity: 52 mo
Battery Voltage: 2.78 V
Brady Statistic AP VP Percent: 0 %
Brady Statistic AS VP Percent: 0 %
Date Time Interrogation Session: 20180524180417
Implantable Lead Implant Date: 20020325
Implantable Lead Location: 753860
Implantable Lead Model: 4092
Implantable Lead Model: 4524
Implantable Pulse Generator Implant Date: 20110719
Lead Channel Impedance Value: 507 Ohm
Lead Channel Pacing Threshold Amplitude: 1 V
Lead Channel Setting Pacing Amplitude: 2.25 V
Lead Channel Setting Pacing Amplitude: 2.5 V
Lead Channel Setting Sensing Sensitivity: 5.6 mV
MDC IDC LEAD IMPLANT DT: 20020325
MDC IDC LEAD LOCATION: 753859
MDC IDC MSMT LEADCHNL RA PACING THRESHOLD PULSEWIDTH: 0.4 ms
MDC IDC MSMT LEADCHNL RV IMPEDANCE VALUE: 747 Ohm
MDC IDC MSMT LEADCHNL RV PACING THRESHOLD AMPLITUDE: 0.5 V
MDC IDC MSMT LEADCHNL RV PACING THRESHOLD PULSEWIDTH: 0.4 ms
MDC IDC SET LEADCHNL RV PACING PULSEWIDTH: 0.4 ms
MDC IDC STAT BRADY AP VS PERCENT: 100 %
MDC IDC STAT BRADY AS VS PERCENT: 0 %

## 2016-08-24 DIAGNOSIS — R972 Elevated prostate specific antigen [PSA]: Secondary | ICD-10-CM | POA: Diagnosis not present

## 2016-08-31 DIAGNOSIS — N5201 Erectile dysfunction due to arterial insufficiency: Secondary | ICD-10-CM | POA: Diagnosis not present

## 2016-08-31 DIAGNOSIS — N403 Nodular prostate with lower urinary tract symptoms: Secondary | ICD-10-CM | POA: Diagnosis not present

## 2016-08-31 DIAGNOSIS — R972 Elevated prostate specific antigen [PSA]: Secondary | ICD-10-CM | POA: Diagnosis not present

## 2016-09-21 ENCOUNTER — Encounter: Payer: Self-pay | Admitting: Cardiovascular Disease

## 2016-10-06 DIAGNOSIS — E1165 Type 2 diabetes mellitus with hyperglycemia: Secondary | ICD-10-CM | POA: Diagnosis not present

## 2016-10-06 DIAGNOSIS — I10 Essential (primary) hypertension: Secondary | ICD-10-CM | POA: Diagnosis not present

## 2016-10-06 DIAGNOSIS — N184 Chronic kidney disease, stage 4 (severe): Secondary | ICD-10-CM | POA: Diagnosis not present

## 2016-10-06 DIAGNOSIS — Z6835 Body mass index (BMI) 35.0-35.9, adult: Secondary | ICD-10-CM | POA: Diagnosis not present

## 2016-10-07 ENCOUNTER — Ambulatory Visit: Payer: Medicare Other | Admitting: Cardiovascular Disease

## 2016-10-08 ENCOUNTER — Telehealth: Payer: Self-pay | Admitting: Cardiology

## 2016-10-08 ENCOUNTER — Encounter: Payer: Medicare Other | Admitting: *Deleted

## 2016-10-08 NOTE — Telephone Encounter (Signed)
LMOVM reminding pt to send remote transmission.   

## 2016-10-09 ENCOUNTER — Encounter: Payer: Self-pay | Admitting: Cardiology

## 2016-10-09 NOTE — Progress Notes (Signed)
Letter  

## 2016-11-02 ENCOUNTER — Encounter: Payer: Self-pay | Admitting: Cardiovascular Disease

## 2016-11-03 ENCOUNTER — Encounter: Payer: Self-pay | Admitting: Cardiovascular Disease

## 2016-11-25 DIAGNOSIS — Z23 Encounter for immunization: Secondary | ICD-10-CM | POA: Diagnosis not present

## 2017-01-06 DIAGNOSIS — I10 Essential (primary) hypertension: Secondary | ICD-10-CM | POA: Diagnosis not present

## 2017-01-06 DIAGNOSIS — M109 Gout, unspecified: Secondary | ICD-10-CM | POA: Diagnosis not present

## 2017-01-06 DIAGNOSIS — Z794 Long term (current) use of insulin: Secondary | ICD-10-CM | POA: Diagnosis not present

## 2017-01-06 DIAGNOSIS — Z125 Encounter for screening for malignant neoplasm of prostate: Secondary | ICD-10-CM | POA: Diagnosis not present

## 2017-01-06 DIAGNOSIS — E7849 Other hyperlipidemia: Secondary | ICD-10-CM | POA: Diagnosis not present

## 2017-01-06 DIAGNOSIS — E1165 Type 2 diabetes mellitus with hyperglycemia: Secondary | ICD-10-CM | POA: Diagnosis not present

## 2017-01-06 DIAGNOSIS — Z6835 Body mass index (BMI) 35.0-35.9, adult: Secondary | ICD-10-CM | POA: Diagnosis not present

## 2017-01-06 DIAGNOSIS — R82998 Other abnormal findings in urine: Secondary | ICD-10-CM | POA: Diagnosis not present

## 2017-01-06 DIAGNOSIS — N184 Chronic kidney disease, stage 4 (severe): Secondary | ICD-10-CM | POA: Diagnosis not present

## 2017-01-11 DIAGNOSIS — M109 Gout, unspecified: Secondary | ICD-10-CM | POA: Diagnosis not present

## 2017-01-11 DIAGNOSIS — I480 Paroxysmal atrial fibrillation: Secondary | ICD-10-CM | POA: Diagnosis not present

## 2017-01-11 DIAGNOSIS — Z0000 Encounter for general adult medical examination without abnormal findings: Secondary | ICD-10-CM | POA: Diagnosis not present

## 2017-01-11 DIAGNOSIS — Z6836 Body mass index (BMI) 36.0-36.9, adult: Secondary | ICD-10-CM | POA: Diagnosis not present

## 2017-01-11 DIAGNOSIS — I495 Sick sinus syndrome: Secondary | ICD-10-CM | POA: Diagnosis not present

## 2017-01-11 DIAGNOSIS — Z1389 Encounter for screening for other disorder: Secondary | ICD-10-CM | POA: Diagnosis not present

## 2017-01-11 DIAGNOSIS — E1165 Type 2 diabetes mellitus with hyperglycemia: Secondary | ICD-10-CM | POA: Diagnosis not present

## 2017-01-11 DIAGNOSIS — I25118 Atherosclerotic heart disease of native coronary artery with other forms of angina pectoris: Secondary | ICD-10-CM | POA: Diagnosis not present

## 2017-01-11 DIAGNOSIS — E1121 Type 2 diabetes mellitus with diabetic nephropathy: Secondary | ICD-10-CM | POA: Diagnosis not present

## 2017-01-11 DIAGNOSIS — N184 Chronic kidney disease, stage 4 (severe): Secondary | ICD-10-CM | POA: Diagnosis not present

## 2017-01-11 DIAGNOSIS — Z794 Long term (current) use of insulin: Secondary | ICD-10-CM | POA: Diagnosis not present

## 2017-01-11 DIAGNOSIS — G4733 Obstructive sleep apnea (adult) (pediatric): Secondary | ICD-10-CM | POA: Diagnosis not present

## 2017-03-08 DIAGNOSIS — J019 Acute sinusitis, unspecified: Secondary | ICD-10-CM | POA: Diagnosis not present

## 2017-03-08 DIAGNOSIS — R05 Cough: Secondary | ICD-10-CM | POA: Diagnosis not present

## 2017-03-08 DIAGNOSIS — Z6836 Body mass index (BMI) 36.0-36.9, adult: Secondary | ICD-10-CM | POA: Diagnosis not present

## 2017-03-09 DIAGNOSIS — R972 Elevated prostate specific antigen [PSA]: Secondary | ICD-10-CM | POA: Diagnosis not present

## 2017-03-10 ENCOUNTER — Inpatient Hospital Stay (HOSPITAL_COMMUNITY)
Admission: EM | Admit: 2017-03-10 | Discharge: 2017-03-12 | DRG: 194 | Disposition: A | Discharge status: Home / Self Care | Payer: Medicare Other | Attending: Internal Medicine | Admitting: Internal Medicine

## 2017-03-10 ENCOUNTER — Emergency Department (HOSPITAL_COMMUNITY): Payer: Medicare Other

## 2017-03-10 ENCOUNTER — Encounter (HOSPITAL_COMMUNITY): Payer: Self-pay | Admitting: Emergency Medicine

## 2017-03-10 ENCOUNTER — Other Ambulatory Visit: Payer: Self-pay

## 2017-03-10 DIAGNOSIS — Z794 Long term (current) use of insulin: Secondary | ICD-10-CM

## 2017-03-10 DIAGNOSIS — Z955 Presence of coronary angioplasty implant and graft: Secondary | ICD-10-CM

## 2017-03-10 DIAGNOSIS — I480 Paroxysmal atrial fibrillation: Secondary | ICD-10-CM | POA: Diagnosis not present

## 2017-03-10 DIAGNOSIS — M109 Gout, unspecified: Secondary | ICD-10-CM | POA: Diagnosis not present

## 2017-03-10 DIAGNOSIS — R072 Precordial pain: Secondary | ICD-10-CM | POA: Diagnosis not present

## 2017-03-10 DIAGNOSIS — G4700 Insomnia, unspecified: Secondary | ICD-10-CM | POA: Diagnosis present

## 2017-03-10 DIAGNOSIS — I248 Other forms of acute ischemic heart disease: Secondary | ICD-10-CM | POA: Diagnosis not present

## 2017-03-10 DIAGNOSIS — Z9989 Dependence on other enabling machines and devices: Secondary | ICD-10-CM

## 2017-03-10 DIAGNOSIS — Z7902 Long term (current) use of antithrombotics/antiplatelets: Secondary | ICD-10-CM

## 2017-03-10 DIAGNOSIS — J101 Influenza due to other identified influenza virus with other respiratory manifestations: Principal | ICD-10-CM | POA: Diagnosis present

## 2017-03-10 DIAGNOSIS — Z7901 Long term (current) use of anticoagulants: Secondary | ICD-10-CM

## 2017-03-10 DIAGNOSIS — I252 Old myocardial infarction: Secondary | ICD-10-CM

## 2017-03-10 DIAGNOSIS — Z6835 Body mass index (BMI) 35.0-35.9, adult: Secondary | ICD-10-CM

## 2017-03-10 DIAGNOSIS — E1122 Type 2 diabetes mellitus with diabetic chronic kidney disease: Secondary | ICD-10-CM | POA: Diagnosis present

## 2017-03-10 DIAGNOSIS — Z950 Presence of cardiac pacemaker: Secondary | ICD-10-CM

## 2017-03-10 DIAGNOSIS — E669 Obesity, unspecified: Secondary | ICD-10-CM | POA: Diagnosis present

## 2017-03-10 DIAGNOSIS — I495 Sick sinus syndrome: Secondary | ICD-10-CM | POA: Diagnosis present

## 2017-03-10 DIAGNOSIS — I2510 Atherosclerotic heart disease of native coronary artery without angina pectoris: Secondary | ICD-10-CM | POA: Diagnosis not present

## 2017-03-10 DIAGNOSIS — R079 Chest pain, unspecified: Secondary | ICD-10-CM | POA: Diagnosis present

## 2017-03-10 DIAGNOSIS — R05 Cough: Secondary | ICD-10-CM | POA: Diagnosis present

## 2017-03-10 DIAGNOSIS — I4891 Unspecified atrial fibrillation: Secondary | ICD-10-CM | POA: Diagnosis present

## 2017-03-10 DIAGNOSIS — E785 Hyperlipidemia, unspecified: Secondary | ICD-10-CM | POA: Diagnosis not present

## 2017-03-10 DIAGNOSIS — R059 Cough, unspecified: Secondary | ICD-10-CM | POA: Diagnosis present

## 2017-03-10 DIAGNOSIS — I129 Hypertensive chronic kidney disease with stage 1 through stage 4 chronic kidney disease, or unspecified chronic kidney disease: Secondary | ICD-10-CM | POA: Diagnosis present

## 2017-03-10 DIAGNOSIS — I2489 Other forms of acute ischemic heart disease: Secondary | ICD-10-CM

## 2017-03-10 DIAGNOSIS — I10 Essential (primary) hypertension: Secondary | ICD-10-CM | POA: Diagnosis present

## 2017-03-10 DIAGNOSIS — N184 Chronic kidney disease, stage 4 (severe): Secondary | ICD-10-CM | POA: Diagnosis present

## 2017-03-10 DIAGNOSIS — G4733 Obstructive sleep apnea (adult) (pediatric): Secondary | ICD-10-CM | POA: Diagnosis present

## 2017-03-10 DIAGNOSIS — E1129 Type 2 diabetes mellitus with other diabetic kidney complication: Secondary | ICD-10-CM | POA: Diagnosis present

## 2017-03-10 DIAGNOSIS — Z79899 Other long term (current) drug therapy: Secondary | ICD-10-CM

## 2017-03-10 LAB — CBC
HCT: 40.9 % (ref 39.0–52.0)
Hemoglobin: 14.1 g/dL (ref 13.0–17.0)
MCH: 31.4 pg (ref 26.0–34.0)
MCHC: 34.5 g/dL (ref 30.0–36.0)
MCV: 91.1 fL (ref 78.0–100.0)
PLATELETS: 139 10*3/uL — AB (ref 150–400)
RBC: 4.49 MIL/uL (ref 4.22–5.81)
RDW: 14.5 % (ref 11.5–15.5)
WBC: 7.3 10*3/uL (ref 4.0–10.5)

## 2017-03-10 LAB — BASIC METABOLIC PANEL
Anion gap: 13 (ref 5–15)
BUN: 29 mg/dL — AB (ref 6–20)
CO2: 15 mmol/L — ABNORMAL LOW (ref 22–32)
CREATININE: 2.85 mg/dL — AB (ref 0.61–1.24)
Calcium: 8.5 mg/dL — ABNORMAL LOW (ref 8.9–10.3)
Chloride: 107 mmol/L (ref 101–111)
GFR calc Af Amer: 23 mL/min — ABNORMAL LOW (ref >60)
GFR, EST NON AFRICAN AMERICAN: 20 mL/min — AB (ref >60)
GLUCOSE: 207 mg/dL — AB (ref 65–99)
POTASSIUM: 4.6 mmol/L (ref 3.5–5.1)
SODIUM: 135 mmol/L (ref 135–145)

## 2017-03-10 LAB — I-STAT TROPONIN, ED: Troponin i, poc: 0.02 ng/mL (ref 0.00–0.08)

## 2017-03-10 MED ORDER — SODIUM CHLORIDE 0.9 % IV SOLN
INTRAVENOUS | Status: DC
  Administered 2017-03-10 – 2017-03-11 (×3): via INTRAVENOUS

## 2017-03-10 MED ORDER — ZOLPIDEM TARTRATE 5 MG PO TABS: 5.0000 mg | ORAL_TABLET | Freq: Every evening | ORAL | Status: DC | PRN

## 2017-03-10 MED ORDER — SODIUM CHLORIDE 0.9 % IV BOLUS (SEPSIS)
500.0000 mL | Freq: Once | INTRAVENOUS | Status: AC
  Administered 2017-03-10: 500 mL via INTRAVENOUS

## 2017-03-10 MED ORDER — ACETAMINOPHEN 325 MG PO TABS
650.0000 mg | ORAL_TABLET | ORAL | Status: DC | PRN
  Administered 2017-03-11: 650 mg via ORAL
  Filled 2017-03-10: qty 2

## 2017-03-10 MED ORDER — DM-GUAIFENESIN ER 30-600 MG PO TB12
1.0000 | ORAL_TABLET | Freq: Two times a day (BID) | ORAL | Status: DC | PRN
  Administered 2017-03-11: 1 via ORAL
  Filled 2017-03-10: qty 1

## 2017-03-10 MED ORDER — MORPHINE SULFATE (PF) 4 MG/ML IV SOLN: 2.0000 mg | INTRAVENOUS | Status: DC | PRN

## 2017-03-10 MED ORDER — ALPRAZOLAM 0.25 MG PO TABS: 0.2500 mg | ORAL_TABLET | Freq: Two times a day (BID) | ORAL | Status: DC | PRN

## 2017-03-10 MED ORDER — ONDANSETRON HCL 4 MG/2ML IJ SOLN
4.0000 mg | Freq: Once | INTRAMUSCULAR | Status: AC
  Administered 2017-03-10: 4 mg via INTRAVENOUS
  Filled 2017-03-10: qty 2

## 2017-03-10 MED ORDER — ONDANSETRON HCL 4 MG/2ML IJ SOLN
4.0000 mg | Freq: Four times a day (QID) | INTRAMUSCULAR | Status: DC | PRN
  Administered 2017-03-11: 4 mg via INTRAVENOUS
  Filled 2017-03-10: qty 2

## 2017-03-10 MED ORDER — INSULIN ASPART 100 UNIT/ML ~~LOC~~ SOLN
0.0000 [IU] | Freq: Three times a day (TID) | SUBCUTANEOUS | Status: DC
  Administered 2017-03-11 (×2): 2 [IU] via SUBCUTANEOUS
  Administered 2017-03-11: 3 [IU] via SUBCUTANEOUS
  Administered 2017-03-12: 2 [IU] via SUBCUTANEOUS

## 2017-03-10 MED ORDER — ALBUTEROL SULFATE (2.5 MG/3ML) 0.083% IN NEBU: 2.5000 mg | INHALATION_SOLUTION | Freq: Four times a day (QID) | RESPIRATORY_TRACT | Status: DC | PRN

## 2017-03-10 MED ORDER — HYDRALAZINE HCL 20 MG/ML IJ SOLN
5.0000 mg | INTRAMUSCULAR | Status: DC | PRN
  Administered 2017-03-11 (×2): 5 mg via INTRAVENOUS
  Filled 2017-03-10 (×3): qty 1

## 2017-03-10 NOTE — ED Provider Notes (Signed)
Emergency Department Provider Note   I have reviewed the triage vital signs and the nursing notes.   HISTORY  Chief Complaint Chest Pain   HPI Bradley Soto is a 77 y.o. male with PMH of CAD, CKD, HLD, HTN, and OSA presents to the emergency department for evaluation of chest pain with diaphoresis and nausea.  Symptoms began approximately 1 hour prior to arrival.  He describes recently starting antibiotics for presumed sinus infection.  Today he was feeling very nauseated and is not felt like eating over the past several days.  No chest pain until an hour prior to arrival when he began feeling a constant central pressure.  This does not feel similar to his prior acute coronary syndrome pain.  No pleuritic quality to pain.  No fevers or chills.  No radiation or modifying factors.   Past Medical History:  Diagnosis Date  . CAD (coronary artery disease)    s/p stent placement  . CKD (chronic kidney disease), stage III ([place])   . Colon polyps   . Diabetes mellitus without complication ([place])   . Gout   . HLD (hyperlipidemia)   . HTN (hypertension)   . Insomnia   . MI (myocardial infarction) ([place]) 1998  . NSTEMI (non-ST elevated myocardial infarction) ([place])    s/p PCI in early 2000s  . OSA on CPAP   . SSS (sick sinus syndrome) ([place])   . Type II or unspecified type diabetes mellitus without mention of complication, not stated as uncontrolled     Patient Active Problem List   Diagnosis Date Noted  . HLD (hyperlipidemia) 03/10/2017  . OSA on CPAP 03/10/2017  . Chest pain 03/10/2017  . Type II diabetes mellitus with renal manifestations ([place]) 03/10/2017  . CKD (chronic kidney disease), stage IV ([place]) 03/10/2017  . Sick sinus syndrome ([place]) 10/16/2013  . CAD (coronary artery disease) 10/10/2013  . Atrial fibrillation ([place]) 10/10/2013  . Gout 10/10/2013    Past Surgical History:  Procedure Laterality Date  . APPENDECTOMY    . CATARACT EXTRACTION    . CORONARY ANGIOPLASTY  WITH STENT PLACEMENT     x4  . HERNIA REPAIR     ventral hernia repair with mesh--revision also was required  . INSERT / REPLACE / REMOVE PACEMAKER  05/10/2000   pacemaker implanted in [place]  . PACEMAKER GENERATOR CHANGE  09/03/09   MDT Adapta generator change in [place]  . PARTIAL COLON REMOVAL  2/2   polyp that could not be removed on colonoscopy    Current Outpatient Rx  . Order #: [id number] Class: Historical Med  . Order #: [id number] Class: Historical Med  . Order #: [id number] Class: Historical Med  . Order #: [id number] Class: Normal  . Order #: [id number] Class: Historical Med  . Order #: [id number] Class: Historical Med  . Order #: [id number] Class: Historical Med  . Order #: [id number] Class: Historical Med  . Order #: [id number] Class: Historical Med  . Order #: [id number] Class: Historical Med  . Order #: [id number] Class: Historical Med  . Order #: [id number] Class: Historical Med  . Order #: [id number] Class: Normal  . Order #: [id number] Class: Historical Med  . Order #: [id number] Class: Historical Med    Allergies Shellfish allergy  Family History  Problem Relation Age of Onset  . Heart attack Father 51  . Heart disease Father   . Breast cancer Sister   . Pulmonary embolism Sister   . Deep vein thrombosis Sister     Social History Social History   Tobacco Use  . Smoking  status: Never Smoker  . Smokeless tobacco: Never Used  Substance Use Topics  . Alcohol use: No  . Drug use: No    Review of Systems  Constitutional: No fever/chills Eyes: No visual changes. ENT: No sore throat. Cardiovascular: Denies chest pain. Respiratory: Denies shortness of breath. Gastrointestinal: No abdominal pain. No nausea, no vomiting. No diarrhea. No constipation. Genitourinary: Negative for dysuria. Musculoskeletal: Negative for back pain. Skin: Negative for rash. Neurological: Negative for headaches, focal weakness or numbness.  10-point ROS otherwise  negative.  ____________________________________________   PHYSICAL EXAM:  VITAL SIGNS: Vitals:   03/10/17 2252 03/10/17 2300  BP:  (!) 161/72  Pulse: 90 88  Resp: 13 (!) 21  SpO2: 94% 95%   Constitutional: Alert and oriented. Well appearing and in no acute distress. Eyes: Conjunctivae are normal.  Head: Atraumatic. Nose: No congestion/rhinnorhea. Mouth/Throat: Mucous membranes are moist.  Neck: No stridor.  Cardiovascular: Normal rate, regular rhythm. Good peripheral circulation. Grossly normal heart sounds.   Respiratory: Normal respiratory effort.  No retractions. Lungs CTAB. Gastrointestinal: Soft and nontender. No distention.  Musculoskeletal: No lower extremity tenderness nor edema. No gross deformities of extremities. Neurologic:  Normal speech and language. No gross focal neurologic deficits are appreciated.  Skin:  Skin is warm, dry and intact. No rash noted.  ____________________________________________   LABS (all labs ordered are listed, but only abnormal results are displayed)  Labs Reviewed  BASIC METABOLIC PANEL - Abnormal; Notable for the following components:      Result Value   CO2 15 (*)    Glucose, Bld 207 (*)    BUN 29 (*)    Creatinine, Ser 2.85 (*)    Calcium 8.5 (*)    GFR calc non Af Amer 20 (*)    GFR calc Af Amer 23 (*)    All other components within normal limits  CBC - Abnormal; Notable for the following components:   Platelets 139 (*)    All other components within normal limits  CULTURE, EXPECTORATED SPUTUM-ASSESSMENT  CULTURE, BLOOD (ROUTINE X 2)  CULTURE, BLOOD (ROUTINE X 2)  INFLUENZA PANEL BY PCR (TYPE A & B)  HEMOGLOBIN A1C  LIPID PANEL  TROPONIN I  TROPONIN I  TROPONIN I  I-STAT TROPONIN, ED   ____________________________________________  EKG   EKG Interpretation  Date/Time:  Wednesday March 10 2017 20:38:29 EST Ventricular Rate:  93 PR Interval:  208 QRS Duration: 102 QT Interval:  358 QTC Calculation: 445 R  Axis:   45 Text Interpretation:  Normal sinus rhythm Incomplete right bundle branch block Inferior infarct , age undetermined Abnormal ECG No STEMI.  Confirmed by Nanda Quinton [phone]) on 03/10/2017 9:31:41 PM       ____________________________________________  RADIOLOGY  Dg Chest 2 View  Result Date: 03/10/2017 CLINICAL DATA:  Chest pain EXAM: CHEST  2 VIEW COMPARISON:  None. FINDINGS: Left-sided pacing device with leads over the right atrium and right ventricle. No acute consolidation or effusion. Borderline cardiomegaly. Aortic atherosclerosis. No pneumothorax. IMPRESSION: No active cardiopulmonary disease.  Borderline cardiomegaly. Electronically Signed   By: Donavan Foil M.D.   On: 03/10/2017 21:25    ____________________________________________   PROCEDURES  Procedure(s) performed:   Procedures  None ____________________________________________   INITIAL IMPRESSION / ASSESSMENT AND PLAN / ED COURSE  Pertinent labs & imaging results that were available during my care of the patient were reviewed by me and considered in my medical decision making (see chart for details).  Patient presents to the emergency department for  evaluation of chest pressure.  He has strong history of coronary artery disease is along with multiple risk factors.  Currently chest pain-free but having some nausea.  EKG reviewed.  Initial troponin negative.  Patient will require admission for enzyme trending and consideration of stress testing.  Differential includes all life-threatening causes for chest pain. This includes but is not exclusive to acute coronary syndrome, aortic dissection, pulmonary embolism, cardiac tamponade, community-acquired pneumonia, pericarditis, musculoskeletal chest wall pain, etc.  Discussed patient's case with Hospitalist to request admission. Patient and family (if present) updated with plan. Care transferred to Hospitalist service.  I reviewed all nursing notes, vitals,  pertinent old records, EKGs, labs, imaging (as available).   ____________________________________________  FINAL CLINICAL IMPRESSION(S) / ED DIAGNOSES  Final diagnoses:  Precordial chest pain     MEDICATIONS GIVEN DURING THIS VISIT:  Medications  0.9 %  sodium chloride infusion (not administered)  morphine 4 MG/ML injection 2 mg (not administered)  albuterol (PROVENTIL) (2.5 MG/3ML) 0.083% nebulizer solution 2.5 mg (not administered)  dextromethorphan-guaiFENesin (MUCINEX DM) 30-600 MG per 12 hr tablet 1 tablet (not administered)  insulin aspart (novoLOG) injection 0-9 Units (not administered)  acetaminophen (TYLENOL) tablet 650 mg (not administered)  ondansetron (ZOFRAN) injection 4 mg (not administered)  zolpidem (AMBIEN) tablet 5 mg (not administered)  ALPRAZolam (XANAX) tablet 0.25 mg (not administered)  hydrALAZINE (APRESOLINE) injection 5 mg (not administered)  ondansetron (ZOFRAN) injection 4 mg (4 mg Intravenous Given 03/10/17 2258)  sodium chloride 0.9 % bolus 500 mL (500 mLs Intravenous New Bag/Given 03/10/17 2258)    Note:  This document was prepared using Dragon voice recognition software and may include unintentional dictation errors.  Nanda Quinton, MD Emergency Medicine    Roi Jafari, [facility], MD 03/11/17 [phone]

## 2017-03-10 NOTE — ED Notes (Signed)
Patient denies pain and is resting comfortably.  

## 2017-03-10 NOTE — H&P (Addendum)
History and Physical    Bradley Soto LGX:[id number] DOB: [birth year] DOA: 03/10/2017  Referring MD/NP/PA:   PCP: Bradley Hatchet, MD   Patient coming from:  The patient is coming from home.  At baseline, pt is independent for most of ADL.  Chief Complaint: Chest pain and cough  HPI: Bradley Soto is a 77 y.o. male with medical history significant of hypertension, hyperlipidemia, diabetes mellitus, gout, SSS, s/p of pacemaker placement, OSA on CPAP, CAD, s/p of stent placement, CKD-4, atrial fibrillation on Pradaxa, who presents with chest pain and cough.  Patient states that he has been having cough with brownish colored sputum production for about 1 week. He had fever of 100.1. His PCP gave him prescription of inhalers cough medications on Monday, without significant improvement. He was started on Levaquin today. Patient denies SOB.   Patient states that he has intermittent exertional chest pain, which has been going on for 2 weeks. He had another episode of chest pain last night, which was located in the substernal area, moderate, nonradiating, pressure-like pain. It lasted for about 30 minutes, and resolved spontaneously. Currently patient does not have chest pain or SOB. Patient reports nausea, which he attributes to the medications that his PCP prescribed. No vomiting, diarrhea or abdominal pain. No symptoms of UTI or unilateral weakness.  ED Course: pt was found to have positive troponin 0.03, WBC 7.3, stable renal function, temperature 100.8, heart rate 80s, respiration 29, oxygen saturation 85% on room air. Chest x-ray negative for infiltration. Patient is placed on telemetry bed for observation.  Review of Systems:   General: has fevers, chills, no body weight gain, has poor appetite, has fatigue HEENT: no blurry vision, hearing changes or sore throat Respiratory: no dyspnea, has coughing, no wheezing CV: has chest pain, no palpitations GI: has nausea, no vomiting, abdominal pain,  diarrhea, constipation GU: no dysuria, burning on urination, increased urinary frequency, hematuria  Ext: no leg edema Neuro: no unilateral weakness, numbness, or tingling, no vision change or hearing loss Skin: no rash, no skin tear. MSK: No muscle spasm, no deformity, no limitation of range of movement in spin Heme: No easy bruising.  Travel history: No recent long distant travel.  Allergy:  Allergies  Allergen Reactions  . Shellfish Allergy Rash    Past Medical History:  Diagnosis Date  . CAD (coronary artery disease)    s/p stent placement  . CKD (chronic kidney disease), stage III ([place])   . Colon polyps   . Diabetes mellitus without complication ([place])   . Gout   . HLD (hyperlipidemia)   . HTN (hypertension)   . Insomnia   . MI (myocardial infarction) ([place]) 1998  . NSTEMI (non-ST elevated myocardial infarction) ([place])    s/p PCI in early 2000s  . OSA on CPAP   . SSS (sick sinus syndrome) ([place])   . Type II or unspecified type diabetes mellitus without mention of complication, not stated as uncontrolled     Past Surgical History:  Procedure Laterality Date  . APPENDECTOMY    . CATARACT EXTRACTION    . CORONARY ANGIOPLASTY WITH STENT PLACEMENT     x4  . HERNIA REPAIR     ventral hernia repair with mesh--revision also was required  . INSERT / REPLACE / REMOVE PACEMAKER  05/10/2000   pacemaker implanted in [place]  . PACEMAKER GENERATOR CHANGE  09/03/09   MDT Adapta generator change in [place]  . PARTIAL COLON REMOVAL  2/2   polyp that could not be  removed on colonoscopy    Social History:  reports that  has never smoked. he has never used smokeless tobacco. He reports that he does not drink alcohol or use drugs.  Family History:  Family History  Problem Relation Age of Onset  . Heart attack Father 59  . Heart disease Father   . Breast cancer Sister   . Pulmonary embolism Sister   . Deep vein thrombosis Sister      Prior to Admission medications     Medication Sig Start Date End Date Taking? Authorizing Provider  allopurinol (ZYLOPRIM) 100 MG tablet Take 100 mg by mouth 2 (two) times daily.    [provider]  amiodarone (PACERONE) 200 MG tablet Take 200 mg by mouth daily.    [provider]  colchicine 0.6 MG tablet Take 0.6 mg by mouth as needed (gout).     [provider]  dabigatran (PRADAXA) 75 MG CAPS capsule Take 1 capsule (75 mg total) by mouth 2 (two) times daily. 04/01/16   Allred, Jeneen Rinks, MD  diltiazem (CARDIZEM) 60 MG tablet Take 60 mg by mouth 4 (four) times daily as needed (palpitations).     [provider]  isosorbide dinitrate (ISORDIL) 30 MG tablet Take 30 mg by mouth daily.    [provider]  Melatonin 10 MG TABS Take 1 tablet by mouth at bedtime as needed (sleep).     [provider]  metoprolol succinate (TOPROL-XL) 100 MG 24 hr tablet Take 50 mg by mouth 2 (two) times daily. Take with or immediately following a meal.    [provider]  nitroGLYCERIN (NITROSTAT) 0.4 MG SL tablet Place 1 tablet (0.4 mg total) under the tongue every 5 (five) minutes as needed for chest pain. 10/10/13   Nahser, [name], MD  NOVOLOG MIX 70/30 FLEXPEN (70-30) 100 UNIT/ML FlexPen Inject 28 Units into the skin 2 (two) times daily with a meal. 02/26/16   [provider]  simvastatin (ZOCOR) 20 MG tablet Take 10 mg by mouth daily.     [provider]  traZODone (DESYREL) 50 MG tablet Take 50 mg by mouth at bedtime.    [provider]    Physical Exam: Vitals:   03/10/17 2345 03/11/17 0000 03/11/17 0045 03/11/17 0147  BP: (!) 159/61 (!) 149/64 (!) 163/80   Pulse: 88 84 85 83  Resp: (!) 23 (!) 29  18  Temp:   (!) 100.8 F (38.2 C)   TempSrc:   Oral   SpO2: 92% 93% 95% (!) 85%  Weight:   114.4 kg (252 lb 3.2 oz)   Height:   5\' 11"  (1.803 m)    General: Not in acute distress HEENT:       Eyes: PERRL, EOMI, no scleral icterus.       ENT: No discharge  from the ears and nose, no pharynx injection, no tonsillar enlargement.        Neck: No JVD, no bruit, no mass felt. Heme: No neck lymph node enlargement. Cardiac: S1/S2, RRR, No murmurs, No gallops or rubs. Respiratory: No rales, wheezing, rhonchi or rubs. GI: Soft, nondistended, nontender, no rebound pain, no organomegaly, BS present. GU: No hematuria Ext: No pitting leg edema bilaterally. 2+DP/PT pulse bilaterally. Musculoskeletal: No joint deformities, No joint redness or warmth, no limitation of ROM in spin. Skin: No rashes.  Neuro: Alert, oriented X3, cranial nerves II-XII grossly intact, moves all extremities normally. Psych: Patient is not psychotic, no suicidal or hemocidal ideation.  Labs on Admission: I have personally reviewed following labs and imaging studies  CBC: Recent Labs  Lab 03/10/17 2044  WBC 7.3  HGB 14.1  HCT 40.9  MCV 91.1  PLT 850*   Basic Metabolic Panel: Recent Labs  Lab 03/10/17 2044  NA 135  K 4.6  CL 107  CO2 15*  GLUCOSE 207*  BUN 29*  CREATININE 2.85*  CALCIUM 8.5*   GFR: Estimated Creatinine Clearance: 27.9 mL/min (A) (by C-G formula based on SCr of 2.85 mg/dL (H)). Liver Function Tests: No results for input(s): AST, ALT, ALKPHOS, BILITOT, PROT, ALBUMIN in the last 168 hours. No results for input(s): LIPASE, AMYLASE in the last 168 hours. No results for input(s): AMMONIA in the last 168 hours. Coagulation Profile: No results for input(s): INR, PROTIME in the last 168 hours. Cardiac Enzymes: Recent Labs  Lab 03/10/17 2315  TROPONINI 0.03*   BNP (last 3 results) No results for input(s): PROBNP in the last 8760 hours. HbA1C: Recent Labs    03/11/17 0107  HGBA1C 7.8*   CBG: No results for input(s): GLUCAP in the last 168 hours. Lipid Profile: Recent Labs    03/11/17 0107  CHOL 105  HDL 38*  LDLCALC 48  TRIG 96  CHOLHDL 2.8   Thyroid Function Tests: No results for input(s): TSH, T4TOTAL, FREET4, T3FREE, THYROIDAB in  the last 72 hours. Anemia Panel: No results for input(s): VITAMINB12, FOLATE, FERRITIN, TIBC, IRON, RETICCTPCT in the last 72 hours. Urine analysis: No results found for: COLORURINE, APPEARANCEUR, LABSPEC, PHURINE, GLUCOSEU, HGBUR, BILIRUBINUR, KETONESUR, PROTEINUR, UROBILINOGEN, NITRITE, LEUKOCYTESUR Sepsis Labs: @LABRCNTIP (procalcitonin:4,lacticidven:4) )No results found for this or any previous visit (from the past 240 hour(s)).   Radiological Exams on Admission: Dg Chest 2 View  Result Date: 03/10/2017 CLINICAL DATA:  Chest pain EXAM: CHEST  2 VIEW COMPARISON:  None. FINDINGS: Left-sided pacing device with leads over the right atrium and right ventricle. No acute consolidation or effusion. Borderline cardiomegaly. Aortic atherosclerosis. No pneumothorax. IMPRESSION: No active cardiopulmonary disease.  Borderline cardiomegaly. Electronically Signed   By: Donavan Foil M.D.   On: 03/10/2017 21:25     EKG: Independently reviewed.  Sinus rhythm, QTC 445, q wave in inferior leads, incomplete right bundle blockage, low voltage.   Assessment/Plan Principal Problem:   Chest pain Active Problems:   CAD (coronary artery disease)   Atrial fibrillation (HCC)   Gout   Sick sinus syndrome (HCC)   HLD (hyperlipidemia)   OSA on CPAP   Type II diabetes mellitus with renal manifestations (HCC)   CKD (chronic kidney disease), stage IV (HCC)   Cough   HTN (hypertension)   Chest pain, hx of CAD and positive trop: s/p of stent. Trop 0.03. His chest pain has resolved. Possibly due to demand ischemia secondary to ongoing upper respiratory infection.  - will place on Tele bed for obs - cycle CE q6 x3 and repeat EKG in the am  - prn Nitroglycerin, Morphine, Zocor, imdur, metoprolol. - will not start ASA since pt is on Pradaxa  - Risk factor stratification: will check FLP and A1C  - 2d echo - Inpatient non-urgent card consult order was put in Epic and message to [employer] was sent out.  Cough:  Chest x-rays negative. X-ray due to upper respiratory viral infection, cannot completely rule out possibility of bronchitis. Patient is being treated with Levaquin for PCP -Will continue Levaquin, 500 mg every 48 hours of per pharmacist's recommendation due to COPD-4 -will check flu pcr -get sputum  culture --will get Procalcitonin and trend lactic acid levels -IVF: 2L of NS bolus in ED, followed by 100 cc/h   Addendum: flu pcr positive for flu A -will start tamiflu now -d/c levaquin  Atrial Fibrillation: CHA2DS2-VASc Score is 5, needs oral anticoagulation. Patient is on Pradaxa at home. Heart rate is well controlled. -Continue Amiodarone, metoprolol and amiodarone  Gout: -continue home allopurinol   Sick sinus syndrome ([place]): -s/p of pace maker  HLD: -zocor  OSA - on CPAP  Type II diabetes mellitus with renal manifestations ([place]): Last A1c not on record. Patient is taking 70/30 insulin at home -will decrease 70/30 insulin dose from 28 to 20 U daily -SSI -Check A1c   HTN:  -Continue home medications: Metoprolol -IV hydralazine prn   CKD (chronic kidney disease), stage IV ([place]): Stable. Recently her baseline creatinine is 3.2 on 04/03/16. -Follow up renal function by BMP  DVT ppx:  SQ Lovenox Code Status: Full code Family Communication: None at bed side.  Disposition Plan:  Anticipate discharge back to previous home environment Consults called:  none Admission status: Obs / tele    Date of Service 03/11/2017    Ivor Costa [facility] Pager [phone]  If 7PM-7AM, please contact night-coverage [email or web address] Password [email or web address] 03/11/2017, 2:30 AM

## 2017-03-10 NOTE — ED Triage Notes (Addendum)
Pt c/o central, non radiating chest pain with diaphoresis and nausea lasting about 45 minutes. Pain started 1 hour PTA. Pt denies pain at this time. Hx MI with stents. Pt pale in triage.

## 2017-03-10 NOTE — ED Notes (Signed)
Pt reports chest pain with exertion x "a few weeks".

## 2017-03-11 ENCOUNTER — Other Ambulatory Visit: Payer: Self-pay

## 2017-03-11 ENCOUNTER — Observation Stay (HOSPITAL_BASED_OUTPATIENT_CLINIC_OR_DEPARTMENT_OTHER): Payer: Medicare Other

## 2017-03-11 DIAGNOSIS — J101 Influenza due to other identified influenza virus with other respiratory manifestations: Secondary | ICD-10-CM | POA: Diagnosis not present

## 2017-03-11 DIAGNOSIS — N184 Chronic kidney disease, stage 4 (severe): Secondary | ICD-10-CM

## 2017-03-11 DIAGNOSIS — Z955 Presence of coronary angioplasty implant and graft: Secondary | ICD-10-CM | POA: Diagnosis not present

## 2017-03-11 DIAGNOSIS — G4700 Insomnia, unspecified: Secondary | ICD-10-CM | POA: Diagnosis present

## 2017-03-11 DIAGNOSIS — R059 Cough, unspecified: Secondary | ICD-10-CM | POA: Diagnosis present

## 2017-03-11 DIAGNOSIS — R079 Chest pain, unspecified: Secondary | ICD-10-CM | POA: Diagnosis not present

## 2017-03-11 DIAGNOSIS — I2510 Atherosclerotic heart disease of native coronary artery without angina pectoris: Secondary | ICD-10-CM | POA: Diagnosis not present

## 2017-03-11 DIAGNOSIS — Z7901 Long term (current) use of anticoagulants: Secondary | ICD-10-CM | POA: Diagnosis not present

## 2017-03-11 DIAGNOSIS — E785 Hyperlipidemia, unspecified: Secondary | ICD-10-CM | POA: Diagnosis not present

## 2017-03-11 DIAGNOSIS — M109 Gout, unspecified: Secondary | ICD-10-CM | POA: Diagnosis present

## 2017-03-11 DIAGNOSIS — Z794 Long term (current) use of insulin: Secondary | ICD-10-CM | POA: Diagnosis not present

## 2017-03-11 DIAGNOSIS — E669 Obesity, unspecified: Secondary | ICD-10-CM | POA: Diagnosis present

## 2017-03-11 DIAGNOSIS — I351 Nonrheumatic aortic (valve) insufficiency: Secondary | ICD-10-CM | POA: Diagnosis not present

## 2017-03-11 DIAGNOSIS — I129 Hypertensive chronic kidney disease with stage 1 through stage 4 chronic kidney disease, or unspecified chronic kidney disease: Secondary | ICD-10-CM | POA: Diagnosis present

## 2017-03-11 DIAGNOSIS — G4733 Obstructive sleep apnea (adult) (pediatric): Secondary | ICD-10-CM | POA: Diagnosis not present

## 2017-03-11 DIAGNOSIS — Z7902 Long term (current) use of antithrombotics/antiplatelets: Secondary | ICD-10-CM | POA: Diagnosis not present

## 2017-03-11 DIAGNOSIS — Z950 Presence of cardiac pacemaker: Secondary | ICD-10-CM | POA: Diagnosis not present

## 2017-03-11 DIAGNOSIS — E1122 Type 2 diabetes mellitus with diabetic chronic kidney disease: Secondary | ICD-10-CM

## 2017-03-11 DIAGNOSIS — I10 Essential (primary) hypertension: Secondary | ICD-10-CM | POA: Diagnosis not present

## 2017-03-11 DIAGNOSIS — Z79899 Other long term (current) drug therapy: Secondary | ICD-10-CM | POA: Diagnosis not present

## 2017-03-11 DIAGNOSIS — I480 Paroxysmal atrial fibrillation: Secondary | ICD-10-CM

## 2017-03-11 DIAGNOSIS — I2489 Other forms of acute ischemic heart disease: Secondary | ICD-10-CM

## 2017-03-11 DIAGNOSIS — R072 Precordial pain: Secondary | ICD-10-CM | POA: Diagnosis present

## 2017-03-11 DIAGNOSIS — I252 Old myocardial infarction: Secondary | ICD-10-CM | POA: Diagnosis not present

## 2017-03-11 DIAGNOSIS — Z6835 Body mass index (BMI) 35.0-35.9, adult: Secondary | ICD-10-CM | POA: Diagnosis not present

## 2017-03-11 DIAGNOSIS — I248 Other forms of acute ischemic heart disease: Secondary | ICD-10-CM | POA: Diagnosis not present

## 2017-03-11 DIAGNOSIS — I495 Sick sinus syndrome: Secondary | ICD-10-CM | POA: Diagnosis not present

## 2017-03-11 DIAGNOSIS — R05 Cough: Secondary | ICD-10-CM | POA: Diagnosis not present

## 2017-03-11 DIAGNOSIS — Z9989 Dependence on other enabling machines and devices: Secondary | ICD-10-CM

## 2017-03-11 LAB — GLUCOSE, CAPILLARY
GLUCOSE-CAPILLARY: 153 mg/dL — AB (ref 65–99)
GLUCOSE-CAPILLARY: 159 mg/dL — AB (ref 65–99)
GLUCOSE-CAPILLARY: 204 mg/dL — AB (ref 65–99)
GLUCOSE-CAPILLARY: 234 mg/dL — AB (ref 65–99)
Glucose-Capillary: 182 mg/dL — ABNORMAL HIGH (ref 65–99)

## 2017-03-11 LAB — HEMOGLOBIN A1C
HEMOGLOBIN A1C: 7.8 % — AB (ref 4.8–5.6)
Mean Plasma Glucose: 177.16 mg/dL

## 2017-03-11 LAB — LACTIC ACID, PLASMA
LACTIC ACID, VENOUS: 1.5 mmol/L (ref 0.5–1.9)
Lactic Acid, Venous: 1.3 mmol/L (ref 0.5–1.9)

## 2017-03-11 LAB — PROCALCITONIN: Procalcitonin: 0.2 ng/mL

## 2017-03-11 LAB — TROPONIN I
TROPONIN I: 0.07 ng/mL — AB (ref <0.03)
Troponin I: 0.03 ng/mL (ref <0.03)
Troponin I: 0.04 ng/mL (ref <0.03)

## 2017-03-11 LAB — LIPID PANEL
CHOLESTEROL: 105 mg/dL (ref 0–200)
HDL: 38 mg/dL — ABNORMAL LOW (ref >40)
LDL Cholesterol: 48 mg/dL (ref 0–99)
TRIGLYCERIDES: 96 mg/dL (ref <150)
Total CHOL/HDL Ratio: 2.8 RATIO
VLDL: 19 mg/dL (ref 0–40)

## 2017-03-11 LAB — ECHOCARDIOGRAM COMPLETE
Height: 71 in
Weight: 4035.2 oz

## 2017-03-11 LAB — INFLUENZA PANEL BY PCR (TYPE A & B)
INFLAPCR: POSITIVE — AB
Influenza B By PCR: NEGATIVE

## 2017-03-11 MED ORDER — DABIGATRAN ETEXILATE MESYLATE 75 MG PO CAPS
75.0000 mg | ORAL_CAPSULE | Freq: Two times a day (BID) | ORAL | Status: DC
  Administered 2017-03-11 – 2017-03-12 (×3): 75 mg via ORAL
  Filled 2017-03-11 (×4): qty 1

## 2017-03-11 MED ORDER — ISOSORBIDE MONONITRATE ER 30 MG PO TB24
30.0000 mg | ORAL_TABLET | Freq: Every day | ORAL | Status: DC
  Administered 2017-03-11 – 2017-03-12 (×2): 30 mg via ORAL
  Filled 2017-03-11 (×2): qty 1

## 2017-03-11 MED ORDER — LEVOFLOXACIN 500 MG PO TABS: 500.0000 mg | ORAL_TABLET | ORAL | Status: DC

## 2017-03-11 MED ORDER — MELATONIN 3 MG PO TABS: 3.0000 mg | ORAL_TABLET | Freq: Every evening | ORAL | Status: DC | PRN

## 2017-03-11 MED ORDER — OSELTAMIVIR PHOSPHATE 30 MG PO CAPS
30.0000 mg | ORAL_CAPSULE | Freq: Every day | ORAL | Status: DC
  Administered 2017-03-11 – 2017-03-12 (×2): 30 mg via ORAL
  Filled 2017-03-11 (×2): qty 1

## 2017-03-11 MED ORDER — SIMVASTATIN 10 MG PO TABS
10.0000 mg | ORAL_TABLET | Freq: Every day | ORAL | Status: DC
  Administered 2017-03-11 – 2017-03-12 (×2): 10 mg via ORAL
  Filled 2017-03-11 (×2): qty 1

## 2017-03-11 MED ORDER — AMIODARONE HCL 200 MG PO TABS
200.0000 mg | ORAL_TABLET | Freq: Every day | ORAL | Status: DC
  Administered 2017-03-11 – 2017-03-12 (×2): 200 mg via ORAL
  Filled 2017-03-11 (×2): qty 1

## 2017-03-11 MED ORDER — PNEUMOCOCCAL VAC POLYVALENT 25 MCG/0.5ML IJ INJ
0.5000 mL | INJECTION | INTRAMUSCULAR | Status: DC
  Filled 2017-03-11: qty 0.5

## 2017-03-11 MED ORDER — SODIUM CHLORIDE 0.9 % IV BOLUS (SEPSIS)
1500.0000 mL | Freq: Once | INTRAVENOUS | Status: AC
  Administered 2017-03-11: 1500 mL via INTRAVENOUS

## 2017-03-11 MED ORDER — ALLOPURINOL 100 MG PO TABS
100.0000 mg | ORAL_TABLET | Freq: Two times a day (BID) | ORAL | Status: DC
  Administered 2017-03-11 – 2017-03-12 (×3): 100 mg via ORAL
  Filled 2017-03-11 (×3): qty 1

## 2017-03-11 MED ORDER — INSULIN ASPART PROT & ASPART (70-30 MIX) 100 UNIT/ML ~~LOC~~ SUSP
18.0000 [IU] | Freq: Two times a day (BID) | SUBCUTANEOUS | Status: DC
  Administered 2017-03-11 – 2017-03-12 (×3): 18 [IU] via SUBCUTANEOUS
  Filled 2017-03-11: qty 10

## 2017-03-11 MED ORDER — ASPIRIN EC 81 MG PO TBEC
81.0000 mg | DELAYED_RELEASE_TABLET | Freq: Every day | ORAL | Status: DC
  Administered 2017-03-11 – 2017-03-12 (×2): 81 mg via ORAL
  Filled 2017-03-11 (×2): qty 1

## 2017-03-11 MED ORDER — METOPROLOL SUCCINATE ER 100 MG PO TB24
100.0000 mg | ORAL_TABLET | Freq: Every day | ORAL | Status: DC
  Administered 2017-03-11 – 2017-03-12 (×2): 100 mg via ORAL
  Filled 2017-03-11 (×2): qty 1

## 2017-03-11 NOTE — Discharge Instructions (Addendum)

## 2017-03-11 NOTE — Progress Notes (Signed)
  Echocardiogram 2D Echocardiogram has been performed.  Bradley Soto 03/11/2017, 12:52 PM

## 2017-03-11 NOTE — Care Management Obs Status (Signed)
[facility] NOTIFICATION   Patient Details  Name: Bradley Soto MRN: [id number] Date of Birth: Jun 09, 1940   Medicare Observation Status Notification Given:  Yes    Carles Collet, RN 03/11/2017, 2:19 PM

## 2017-03-11 NOTE — Progress Notes (Signed)
Progress Note    Bradley Soto  EXH:[id number] DOB: September 24, 1940  DOA: 03/10/2017 PCP: [name], MD    Brief Narrative:   Chief complaint: Follow-up chest pain and cough  Medical records reviewed and are as summarized below:  Bradley Soto is an 77 y.o. male with a PMH of hypertension, hyperlipidemia, diabetes, gout, SSS status post pacemaker, OSA on CPAP, CAD status post stent, stage IV CKD, and atrial fibrillation on chronic anticoagulation who was admitted 03/10/17 for evaluation of chest pain and cough associated with fever. He was treated by his PCP with Levaquin and took 1 dose of this. Chest x-ray negative for infiltrates. Troponin mildly elevated.  Assessment/Plan:   Principal Problems:   Chest pain in a patient with a known history of CAD and positive troponin Likely demand ischemia in the setting of influenza A. Troponins mildly elevated. Continue metoprolol, Imdur, statin. He is on chronic anticoagulation with Pradaxa. 2-D echo ordered. Cardiology consultation requested. We'll add low-dose aspirin.    Influenza A with cough Levaquin discontinued and patient started on Tamiflu.  Active Problems:   Atrial fibrillation (HCC) CHA2DS2-VASc Score is 5, on Pradaxa. Continue amiodarone and metoprolol..    Gout Continue allopurinol.    Sick sinus syndrome ([place]) Status post pacemaker.    HLD (hyperlipidemia) Continue Zocor. LDL well controlled at 48.    OSA  On CPAP Q HS.    Type II diabetes mellitus with renal manifestations (HCC) Hemoglobin A1c 7.8%. Currently being managed with 20 units of 70/30 insulin twice a day.    CKD (chronic kidney disease), stage IV (HCC)/metabolic acidosis Baseline creatinine 3.2. Current creatinine 2.85.      HTN (hypertension) Continue metoprolol and when necessary hydralazine.    Obesity Body mass index is 35.17 kg/m.   Family Communication/Anticipated D/C date and plan/Code Status   DVT prophylaxis: Pradaxa  ordered. Code Status: Full Code.  Family Communication: Wife updated at the bedside. Disposition Plan: Home when stable.   Medical Consultants:    Cardiology   Anti-Infectives:    Tamiflu 03/10/17--->  Subjective:   Bradley Soto feels bad. He has no appetite. Denies current chest pain. Mildly short of breath with cough.  Objective:    Vitals:   03/11/17 0000 03/11/17 0045 03/11/17 0147 03/11/17 0547  BP: (!) 149/64 (!) 163/80  (!) 185/89  Pulse: 84 85 83 74  Resp: (!) 29  18 15   Temp:  (!) 100.8 F (38.2 C)  98.7 F (37.1 C)  TempSrc:  Oral  Oral  SpO2: 93% 95% (!) 85% 97%  Weight:  114.4 kg (252 lb 3.2 oz)    Height:  5\' 11"  (1.803 m)      Intake/Output Summary (Last 24 hours) at 03/11/2017 0739 Last data filed at 03/11/2017 0719 Gross per 24 hour  Intake 2685 ml  Output 225 ml  Net 2460 ml   Filed Weights   03/10/17 2046 03/11/17 0045  Weight: 113.4 kg (250 lb) 114.4 kg (252 lb 3.2 oz)    Exam: General: No acute distress. Ill appearing. Cardiovascular: Heart sounds show a regular rate, and rhythm. No gallops or rubs. No murmurs. No JVD. Lungs: Course bilaterally with occasional rhonchi/wheeze. Abdomen: Soft, nontender, nondistended with normal active bowel sounds. No masses. No hepatosplenomegaly. Neurological: Alert and oriented 3. Moves all extremities 4 with equal strength. Cranial nerves II through XII grossly intact. Skin: Warm and dry. No rashes or lesions. Extremities: No clubbing or cyanosis. No edema. Pedal pulses 2+. Psychiatric:  Mood and affect are normal. Insight and judgment are fair.   Data Reviewed:   I have personally reviewed following labs and imaging studies:  Labs: Labs show the following:   Basic Metabolic Panel: Recent Labs  Lab 03/10/17 2044  NA 135  K 4.6  CL 107  CO2 15*  GLUCOSE 207*  BUN 29*  CREATININE 2.85*  CALCIUM 8.5*   GFR Estimated Creatinine Clearance: 27.9 mL/min (A) (by C-G formula based on SCr of  2.85 mg/dL (H)).  CBC: Recent Labs  Lab 03/10/17 2044  WBC 7.3  HGB 14.1  HCT 40.9  MCV 91.1  PLT 139*   Cardiac Enzymes: Recent Labs  Lab 03/10/17 2315 03/11/17 0107  TROPONINI 0.03* 0.04*   Hgb A1c: Recent Labs    03/11/17 0107  HGBA1C 7.8*   Lipid Profile: Recent Labs    03/11/17 0107  CHOL 105  HDL 38*  LDLCALC 48  TRIG 96  CHOLHDL 2.8   Sepsis Labs: Recent Labs  Lab 03/10/17 2044 03/11/17 0305  PROCALCITON  --  0.20  WBC 7.3  --   LATICACIDVEN  --  1.3    Microbiology No results found for this or any previous visit (from the past 240 hour(s)).  Procedures and diagnostic studies:  Dg Chest 2 View: 03/10/2017: My independent review of the image shows: No infiltrates/pneumonia.      Medications:   . allopurinol  100 mg Oral BID  . amiodarone  200 mg Oral Daily  . dabigatran  75 mg Oral BID  . insulin aspart  0-9 Units Subcutaneous TID WC  . insulin aspart protamine- aspart  18 Units Subcutaneous BID WC  . isosorbide mononitrate  30 mg Oral Daily  . metoprolol succinate  100 mg Oral Daily  . oseltamivir  30 mg Oral Daily  . [START ON 03/12/2017] pneumococcal 23 valent vaccine  0.5 mL Intramuscular Tomorrow-1000  . simvastatin  10 mg Oral Daily   Continuous Infusions: . sodium chloride 100 mL/hr at 03/11/17 0557     LOS: 0 days   Jacquelynn Cree  [facility] Pager [phone]. If unable to reach me by pager, please call my cell phone at [phone].  *Please refer to [email or web address], password [email or web address] to get updated schedule on who will round on this patient, as hospitalists switch teams weekly. If 7PM-7AM, please contact night-coverage at [email or web address], password [email or web address] for any overnight needs.  03/11/2017, 7:39 AM

## 2017-03-11 NOTE — Consult Note (Signed)
Cardiology Consultation:   Patient ID: Bradley Soto; [id number]; 1940-09-29   Admit date: 03/10/2017 Date of Consult: 03/11/2017  Primary Care Provider: [name], MD Primary Cardiologist: Mertie Moores, MD  Chief Complaint: chest pain and cough  Patient Profile:   Bradley Soto is a 77 y.o. male with a hx of HTN, HL, DM, gout, SSS s/p PPM (2002, generator change 2011), OSA on CPAP, CAD s/p PCI (4-5 stents in early 2000's), CKD stage 4, and PAF on chronic pradaxa who is being seen today for the evaluation of chest pain and cough at the request of Dr. Rockne Menghini.  History of Present Illness:   He last saw Dr. Rayann Heman for follow up in EP clinic on 04/01/2016. His pacemaker was functioning normal. Amio and pradaxa were continued for A fib. No changes were made.   Bradley Soto presents to the ED on 1/23 with one-two weeks of productive cough with brown sputum and intermittent chest pain. Tmax 100.1. He saw his PCP on 1/21 and was prescribed an inhaler and cough medicine, but he did not improve. Yesterday, he called his PCP and was started on Levaquin. He continued to feel badly and had more CP, so he presented to the ED. Levaquin was DC'd and changed to tamiflu once his flu swab came back positive for flu A.   He describes the chest pain as "congestion" and "achey" and rates it 5/10. It is substernal and does not radiate. It is associated with SOB and ongoing nausea. Reports no swelling, chills, or diaphoresis. CP is not associated with coughing. Poor appetite and nausea over the last several days. Some vague dizziness. The CP started shortly after the cough 1-2 weeks ago. The pain lasts about 30 minutes and resolves spontaneously. It occurs at rest. It does not feel similar to his prior ACS pain. He did not try nitro to relieve the pain. He denies any pain currently, but feels achy and nauseated. He says that it has been several years since he has had a LHC. No cath is on file. He had a bad  experience with a stress test in the past. He reports both were "years ago", but unclear when these were.   He lives at home with his wife. He does not smoke, drink, or do drugs. He does not typically miss any medications, but missed meds yesterday. He did take his pradaxa yesterday.  Pertinent admission labs include: Na 135, K 4.6, creatinine 2.85, BUN 29, troponin 0.02 > 0.03 > 0.04, WBC 7.3, hemoglobin 14.1, platelets 139, influenza A positive, blood cultures pending, CXR: No active cardiopulmonary disease. Borderline cardiomegaly.  Past Medical History:  Diagnosis Date  . CAD (coronary artery disease)    s/p stent placement  . CKD (chronic kidney disease), stage III ([place])   . Colon polyps   . Diabetes mellitus without complication ([place])   . Gout   . HLD (hyperlipidemia)   . HTN (hypertension)   . Insomnia   . MI (myocardial infarction) ([place]) 1998  . NSTEMI (non-ST elevated myocardial infarction) ([place])    s/p PCI in early 2000s  . OSA on CPAP   . SSS (sick sinus syndrome) ([place])   . Type II or unspecified type diabetes mellitus without mention of complication, not stated as uncontrolled     Past Surgical History:  Procedure Laterality Date  . APPENDECTOMY    . CATARACT EXTRACTION    . CORONARY ANGIOPLASTY WITH STENT PLACEMENT     x4  . HERNIA REPAIR  ventral hernia repair with mesh--revision also was required  . INSERT / REPLACE / REMOVE PACEMAKER  05/10/2000   pacemaker implanted in [place]  . PACEMAKER GENERATOR CHANGE  09/03/09   MDT Adapta generator change in [place]  . PARTIAL COLON REMOVAL  2/2   polyp that could not be removed on colonoscopy     Inpatient Medications: Scheduled Meds: . allopurinol  100 mg Oral BID  . amiodarone  200 mg Oral Daily  . aspirin EC  81 mg Oral Daily  . dabigatran  75 mg Oral BID  . insulin aspart  0-9 Units Subcutaneous TID WC  . insulin aspart protamine- aspart  18 Units Subcutaneous BID WC  . isosorbide mononitrate  30 mg Oral  Daily  . metoprolol succinate  100 mg Oral Daily  . oseltamivir  30 mg Oral Daily  . [START ON 03/12/2017] pneumococcal 23 valent vaccine  0.5 mL Intramuscular Tomorrow-1000  . simvastatin  10 mg Oral Daily   Continuous Infusions: . sodium chloride 100 mL/hr at 03/11/17 0557   PRN Meds: acetaminophen, albuterol, ALPRAZolam, dextromethorphan-guaiFENesin, hydrALAZINE, Melatonin, morphine injection, ondansetron (ZOFRAN) IV, zolpidem  Home Meds: Prior to Admission medications   Medication Sig Start Date End Date Taking? Authorizing Provider  albuterol (PROVENTIL HFA;VENTOLIN HFA) 108 (90 Base) MCG/ACT inhaler Inhale 1-2 puffs into the lungs every 6 (six) hours as needed for wheezing or shortness of breath.   Yes [provider]  allopurinol (ZYLOPRIM) 100 MG tablet Take 100 mg by mouth 2 (two) times daily.   Yes [provider]  amiodarone (PACERONE) 200 MG tablet Take 200 mg by mouth daily.   Yes [provider]  dabigatran (PRADAXA) 75 MG CAPS capsule Take 1 capsule (75 mg total) by mouth 2 (two) times daily. 04/01/16  Yes Allred, Jeneen Rinks, MD  dextromethorphan (DELSYM) 30 MG/5ML liquid Take 30 mg by mouth as needed for cough.   Yes [provider]  fluticasone (FLONASE) 50 MCG/ACT nasal spray Place 1 spray into both nostrils daily.   Yes [provider]  fluticasone furoate-vilanterol (BREO ELLIPTA) 100-25 MCG/INH AEPB Inhale 1 puff into the lungs daily.   Yes [provider]  guaifenesin (HUMIBID E) 400 MG TABS tablet Take 400 mg by mouth every 6 (six) hours as needed (for congestion).   Yes [provider]  isosorbide mononitrate (IMDUR) 30 MG 24 hr tablet Take 30 mg by mouth daily.   Yes [provider]  levofloxacin (LEVAQUIN) 500 MG tablet Take 500 mg by mouth daily.   Yes [provider]  Melatonin 10 MG TABS Take 1 tablet by mouth at bedtime as needed (sleep).    Yes [provider]  metoprolol  succinate (TOPROL-XL) 100 MG 24 hr tablet Take 100 mg by mouth daily. Take with or immediately following a meal.   Yes [provider]  nitroGLYCERIN (NITROSTAT) 0.4 MG SL tablet Place 1 tablet (0.4 mg total) under the tongue every 5 (five) minutes as needed for chest pain. 10/10/13  Yes Nahser, [name], MD  NOVOLOG MIX 70/30 FLEXPEN (70-30) 100 UNIT/ML FlexPen Inject 28 Units into the skin 2 (two) times daily with a meal. 02/26/16  Yes [provider]  simvastatin (ZOCOR) 20 MG tablet Take 10 mg by mouth daily.    Yes [provider]    Allergies:    Allergies  Allergen Reactions  . Shellfish Allergy Rash    Social History:   Social History   Socioeconomic History  .  Marital status: Married    Spouse name: Not on file  . Number of children: Not on file  . Years of education: Not on file  . Highest education level: Not on file  Social Needs  . Financial resource strain: Not on file  . Food insecurity - worry: Not on file  . Food insecurity - inability: Not on file  . Transportation needs - medical: Not on file  . Transportation needs - non-medical: Not on file  Occupational History  . Not on file  Tobacco Use  . Smoking status: Never Smoker  . Smokeless tobacco: Never Used  Substance and Sexual Activity  . Alcohol use: No  . Drug use: No  . Sexual activity: Not on file  Other Topics Concern  . Not on file  Social History Narrative  . Not on file    Family History:   The patient's family history includes Breast cancer in his sister; Deep vein thrombosis in his sister; Heart attack (age of onset: 91) in his father; Heart disease in his father; Pulmonary embolism in his sister.  ROS:  Please see the history of present illness.  All other ROS reviewed and negative.     Physical Exam/Data:   Vitals:   03/11/17 0000 03/11/17 0045 03/11/17 0147 03/11/17 0547  BP: (!) 149/64 (!) 163/80  (!) 185/89  Pulse: 84 85 83 74  Resp: (!) 29  18 15   Temp:   (!) 100.8 F (38.2 C)  98.7 F (37.1 C)  TempSrc:  Oral  Oral  SpO2: 93% 95% (!) 85% 97%  Weight:  252 lb 3.2 oz (114.4 kg)    Height:  5\' 11"  (1.803 m)      Intake/Output Summary (Last 24 hours) at 03/11/2017 0832 Last data filed at 03/11/2017 0719 Gross per 24 hour  Intake 2685 ml  Output 225 ml  Net 2460 ml   Filed Weights   03/10/17 2046 03/11/17 0045  Weight: 250 lb (113.4 kg) 252 lb 3.2 oz (114.4 kg)   Body mass index is 35.17 kg/m.  General: Well developed, well nourished, in no acute distress. Head: Normocephalic, atraumatic, sclera non-icteric, no xanthomas, nares are without discharge.  Neck: Negative for carotid bruits. JVD not elevated. Lungs: No wheezes, rales, or rhonchi. Breathing is unlabored. Coarse breath sounds in BLE. Nonproductive coughing with deep breaths.  Heart: RRR with S1 S2. No murmurs, rubs, or gallops appreciated. Distant heart sounds Abdomen: Soft, non-tender, non-distended with normoactive bowel sounds. No hepatomegaly. No rebound/guarding. No obvious abdominal masses. Msk:  Strength and tone appear normal for age. Extremities: No clubbing or cyanosis. No edema.  Distal pedal pulses are 2+ and equal bilaterally. Neuro: Alert and oriented X 3. No facial asymmetry. No focal deficit. Moves all extremities spontaneously. Psych:  Responds to questions appropriately with a normal affect.  EKG:  The EKG was personally reviewed and demonstrates NSR 93 bpm with incomplete RBBB Telemetry: SR 1st degree AV block with IVCD 70-90's. Occasional a-pacing. Personally reviewed.  Relevant CV Studies: No echo or ischemic work up on file. Echo ordered this admission and is pending.  Laboratory Data:  Chemistry Recent Labs  Lab 03/10/17 2044  NA 135  K 4.6  CL 107  CO2 15*  GLUCOSE 207*  BUN 29*  CREATININE 2.85*  CALCIUM 8.5*  GFRNONAA 20*  GFRAA 23*  ANIONGAP 13    No results for input(s): PROT, ALBUMIN, AST, ALT, ALKPHOS, BILITOT in the last 168  hours. Hematology Recent Labs  Lab 03/10/17 2044  WBC 7.3  RBC 4.49  HGB 14.1  HCT 40.9  MCV 91.1  MCH 31.4  MCHC 34.5  RDW 14.5  PLT 139*   Cardiac Enzymes Recent Labs  Lab 03/10/17 2315 03/11/17 0107  TROPONINI 0.03* 0.04*    Recent Labs  Lab 03/10/17 2057  TROPIPOC 0.02    BNPNo results for input(s): BNP, PROBNP in the last 168 hours.  DDimer No results for input(s): DDIMER in the last 168 hours.  Radiology/Studies:  Dg Chest 2 View  Result Date: 03/10/2017 CLINICAL DATA:  Chest pain EXAM: CHEST  2 VIEW COMPARISON:  None. FINDINGS: Left-sided pacing device with leads over the right atrium and right ventricle. No acute consolidation or effusion. Borderline cardiomegaly. Aortic atherosclerosis. No pneumothorax. IMPRESSION: No active cardiopulmonary disease.  Borderline cardiomegaly. Electronically Signed   By: Donavan Foil M.D.   On: 03/10/2017 21:25    Assessment and Plan:   1. Chest pain with hx of CAD with PCI's (early 2000's) - CP began around the same time as cough/flu symptoms and sounds like it is more likely related to the flu. It is not similar to prior ACS pain. He has several risk factors for ACS including: HTN, DM, HL, and personal hx of CAD - Not a good candidate for LHC with CKD stage 4. He has had a bad experience with stress tests in the past and said he would not want to have one. Recommend medical management for now.    - Cont ASA, simvastatin, imdur, and Toprol XL - Troponin 0.02 > 0.03 > 0.04. EKG unchanged. Continue to trend troponin.  - Echo pending. Will follow results to guide medical therapy.  2. CKD stage 4 - Creatinine 2.85 (3.2-3.4 in February 2018)  3. PAF - Currently in NSR 70-90's with occ A-pacing. - Continue pradaxa and amiodarone 200 daily  - S/p PPM for SSS  4. HTN - Elevated SBP 150-180's. Missed his BP medications yesterday. - He is normally SBP 130-140's, so hopefully BP will normalize with resuming home medications.  -  Continue prn hydralazine. May need to increase.   5. HL - Continue simvastatin 10 mg daily. LDL is 48.  6. OSA - Continue CPAP HS  7. Flu A - Per primary team - Tmax 100.8 while inpatient. Afebrile this am.   For questions or updates, please contact [place] Please consult [email or web address] for contact info under Cardiology/STEMI.    Signed, Georgiana Shore, NP  03/11/2017 8:32 AM

## 2017-03-12 LAB — BASIC METABOLIC PANEL
ANION GAP: 11 (ref 5–15)
BUN: 36 mg/dL — AB (ref 6–20)
CALCIUM: 8.3 mg/dL — AB (ref 8.9–10.3)
CO2: 16 mmol/L — AB (ref 22–32)
Chloride: 110 mmol/L (ref 101–111)
Creatinine, Ser: 3.35 mg/dL — ABNORMAL HIGH (ref 0.61–1.24)
GFR calc Af Amer: 19 mL/min — ABNORMAL LOW (ref >60)
GFR calc non Af Amer: 16 mL/min — ABNORMAL LOW (ref >60)
GLUCOSE: 167 mg/dL — AB (ref 65–99)
Potassium: 5.1 mmol/L (ref 3.5–5.1)
Sodium: 137 mmol/L (ref 135–145)

## 2017-03-12 LAB — TROPONIN I: Troponin I: 0.13 ng/mL (ref <0.03)

## 2017-03-12 LAB — GLUCOSE, CAPILLARY: Glucose-Capillary: 164 mg/dL — ABNORMAL HIGH (ref 65–99)

## 2017-03-12 MED ORDER — OSELTAMIVIR PHOSPHATE 30 MG PO CAPS: 30.0000 mg | ORAL_CAPSULE | Freq: Every day | ORAL | 0 refills | Status: AC

## 2017-03-12 MED ORDER — ONDANSETRON 8 MG PO TBDP: 8.0000 mg | ORAL_TABLET | Freq: Three times a day (TID) | ORAL | 0 refills | Status: DC | PRN

## 2017-03-12 MED ORDER — HYDRALAZINE HCL 25 MG PO TABS
25.0000 mg | ORAL_TABLET | Freq: Three times a day (TID) | ORAL | Status: DC
  Administered 2017-03-12: 25 mg via ORAL
  Filled 2017-03-12: qty 1

## 2017-03-12 MED ORDER — ASPIRIN 81 MG PO TBEC: 81.0000 mg | DELAYED_RELEASE_TABLET | Freq: Every day | ORAL | Status: DC

## 2017-03-12 MED ORDER — HYDRALAZINE HCL 25 MG PO TABS: 25.0000 mg | ORAL_TABLET | Freq: Three times a day (TID) | ORAL | 2 refills | Status: DC

## 2017-03-12 NOTE — Discharge Summary (Signed)
Physician Discharge Summary  Bradley Soto WGY:[id number] DOB: Nov 29, 1940 DOA: 03/10/2017  PCP: [name], MD  Admit date: 03/10/2017 Discharge date: 03/12/2017  Admitted From: Home Discharge disposition: Home   Recommendations for Outpatient Follow-Up:   1. F/U with PCP for worsening symptoms. 2. Recommend close nephrology follow-up and consideration for initiating treatment for metabolic acidosis with oral sodium bicarbonate.  Discharge Diagnosis:   Principal Problem:   Chest pain Active Problems:   CAD (coronary artery disease)   Atrial fibrillation (HCC)   Gout   Sick sinus syndrome (HCC)   HLD (hyperlipidemia)   OSA on CPAP   Type II diabetes mellitus with renal manifestations (HCC)   CKD (chronic kidney disease), stage IV (HCC)   Cough   HTN (hypertension)   Influenza A   Demand ischemia ([place])  Discharge Condition: Improved.  Diet recommendation: Low sodium, heart healthy.  Carbohydrate-modified.    CODE STATUS: Full.  History of Present Illness:   Bradley Soto is an 77 y.o. male with a PMH of hypertension, hyperlipidemia, diabetes, gout, SSS status post pacemaker, OSA on CPAP, CAD status post stent, stage IV CKD, and atrial fibrillation on chronic anticoagulation who was admitted 03/10/17 for evaluation of chest pain and cough associated with fever. He was treated by his PCP with Levaquin and took 1 dose of this. Chest x-ray negative for infiltrates. Troponin mildly elevated.  Hospital Course by Problem:   Principal Problems:   Chest pain in a patient with a known history of CAD and positive troponin Mildly elevated troponin felt to be secondary to demand ischemia in the setting of influenza A. Discharge home on metoprolol, Imdur, statin and aspirin. He is on chronic anticoagulation with Pradaxa. Cardiology has evaluated the patient and feels that his minimal elevation in troponin is from demand ischemia in the setting of influenza and advanced kidney  disease. No further inpatient ischemic evaluation recommended. 2-D echo showed an EF of 60-65 percent with akinesis of the basal inferolateral wall and mild diastolic dysfunction. Recommend close follow-up with cardiologist. No anginal symptoms noted. Patient requesting discharge.    Influenza A with cough Still running fevers. Continue Tamiflu.  Active Problems:   Atrial fibrillation (HCC) CHA2DS2-VASc Scoreis 5, on Pradaxa. Continue amiodarone and metoprolol. Heart rate 58-106.    Gout Continue allopurinol.    Sick sinus syndrome ([place]) Status post pacemaker.    HLD (hyperlipidemia) Continue Zocor. LDL well controlled at 48.    OSA  On CPAP Q HS.    Type II diabetes mellitus with renal manifestations (HCC) Hemoglobin A1c 7.8%. Currently being managed with 20 units of 70/30 insulin twice a day. CBGs 153-182. Reasonable control.    CKD (chronic kidney disease), stage IV (HCC)/metabolic acidosis Baseline creatinine 3.2. Needs close nephrology follow-up.      HTN (hypertension) Suboptimally controlled. Will add standing dose hydralazine. Continue metoprolol.    Obesity Body mass index is 35.42 kg/m.   Medical Consultants:    Cardiology   Discharge Exam:   Vitals:   03/12/17 0500 03/12/17 0926  BP: 112/77 129/74  Pulse: (!) 58 (!) 112  Resp: 16   Temp: 98.9 F (37.2 C)   SpO2: 94%    Vitals:   03/11/17 2003 03/11/17 2040 03/12/17 0500 03/12/17 0926  BP: 107/65  112/77 129/74  Pulse: (!) 106  (!) 58 (!) 112  Resp: (!) 21 18 16    Temp: (!) 101.1 F (38.4 C)  98.9 F (37.2 C)   TempSrc: Oral  Oral  SpO2: 94%  94%   Weight:   115.2 kg (253 lb 15.5 oz)   Height:        General exam: Appears calm and comfortable.  Respiratory system: Clear to auscultation, but diminished. Respiratory effort normal. Cardiovascular system: S1 & S2 heard, RRR. No JVD,  rubs, gallops or clicks. No murmurs. Gastrointestinal system: Abdomen is nondistended, soft and  nontender. No organomegaly or masses felt. Normal bowel sounds heard. Central nervous system: Alert and oriented. No focal neurological deficits. Extremities: No clubbing,  or cyanosis. No edema. Skin: No rashes, lesions or ulcers. Psychiatry: Judgement and insight appear normal. Mood & affect appropriate.    The results of significant diagnostics from this hospitalization (including imaging, microbiology, ancillary and laboratory) are listed below for reference.     Procedures and Diagnostic Studies:   Dg Chest 2 View  Result Date: 03/10/2017 CLINICAL DATA:  Chest pain EXAM: CHEST  2 VIEW COMPARISON:  None. FINDINGS: Left-sided pacing device with leads over the right atrium and right ventricle. No acute consolidation or effusion. Borderline cardiomegaly. Aortic atherosclerosis. No pneumothorax. IMPRESSION: No active cardiopulmonary disease.  Borderline cardiomegaly. Electronically Signed   By: Donavan Foil M.D.   On: 03/10/2017 21:25     Labs:   Basic Metabolic Panel: Recent Labs  Lab 03/10/17 2044 03/12/17 0754  NA 135 137  K 4.6 5.1  CL 107 110  CO2 15* 16*  GLUCOSE 207* 167*  BUN 29* 36*  CREATININE 2.85* 3.35*  CALCIUM 8.5* 8.3*   GFR Estimated Creatinine Clearance: 23.8 mL/min (A) (by C-G formula based on SCr of 3.35 mg/dL (H)).  CBC: Recent Labs  Lab 03/10/17 2044  WBC 7.3  HGB 14.1  HCT 40.9  MCV 91.1  PLT 139*   Cardiac Enzymes: Recent Labs  Lab 03/10/17 2315 03/11/17 0107 03/11/17 1023 03/12/17 0754  TROPONINI 0.03* 0.04* 0.07* 0.13*   BNP: Invalid input(s): POCBNP CBG: Recent Labs  Lab 03/11/17 0743 03/11/17 1134 03/11/17 1617 03/11/17 2121 03/12/17 0739  GLUCAP 204* 182* 159* 153* 164*   Hgb A1c Recent Labs    03/11/17 0107  HGBA1C 7.8*   Lipid Profile Recent Labs    03/11/17 0107  CHOL 105  HDL 38*  LDLCALC 48  TRIG 96  CHOLHDL 2.8    Discharge Instructions:   Discharge Instructions    Call MD for:  difficulty  breathing, headache or visual disturbances   Complete by:  As directed    Call MD for:  persistant dizziness or light-headedness   Complete by:  As directed    Call MD for:  persistant nausea and vomiting   Complete by:  As directed    Diet - low sodium heart healthy   Complete by:  As directed    Increase activity slowly   Complete by:  As directed      Allergies as of 03/12/2017      Reactions   Shellfish Allergy Rash      Medication List    STOP taking these medications   levofloxacin 500 MG tablet Commonly known as:  LEVAQUIN     TAKE these medications   albuterol 108 (90 Base) MCG/ACT inhaler Commonly known as:  PROVENTIL HFA;VENTOLIN HFA Inhale 1-2 puffs into the lungs every 6 (six) hours as needed for wheezing or shortness of breath.   allopurinol 100 MG tablet Commonly known as:  ZYLOPRIM Take 100 mg by mouth 2 (two) times daily.   amiodarone 200 MG tablet Commonly known as:  PACERONE Take 200 mg by mouth daily.   aspirin 81 MG EC tablet Take 1 tablet (81 mg total) by mouth daily. Start taking on:  03/13/2017   BREO ELLIPTA 100-25 MCG/INH Aepb Generic drug:  fluticasone furoate-vilanterol Inhale 1 puff into the lungs daily.   dabigatran 75 MG Caps capsule Commonly known as:  PRADAXA Take 1 capsule (75 mg total) by mouth 2 (two) times daily.   DELSYM 30 MG/5ML liquid Generic drug:  dextromethorphan Take 30 mg by mouth as needed for cough.   fluticasone 50 MCG/ACT nasal spray Commonly known as:  FLONASE Place 1 spray into both nostrils daily.   guaifenesin 400 MG Tabs tablet Commonly known as:  HUMIBID E Take 400 mg by mouth every 6 (six) hours as needed (for congestion).   hydrALAZINE 25 MG tablet Commonly known as:  APRESOLINE Take 1 tablet (25 mg total) by mouth every 8 (eight) hours.   isosorbide mononitrate 30 MG 24 hr tablet Commonly known as:  IMDUR Take 30 mg by mouth daily.   Melatonin 10 MG Tabs Take 1 tablet by mouth at bedtime as  needed (sleep).   metoprolol succinate 100 MG 24 hr tablet Commonly known as:  TOPROL-XL Take 100 mg by mouth daily. Take with or immediately following a meal.   nitroGLYCERIN 0.4 MG SL tablet Commonly known as:  NITROSTAT Place 1 tablet (0.4 mg total) under the tongue every 5 (five) minutes as needed for chest pain.   NOVOLOG MIX 70/30 FLEXPEN (70-30) 100 UNIT/ML FlexPen Generic drug:  insulin aspart protamine - aspart Inject 28 Units into the skin 2 (two) times daily with a meal.   ondansetron 8 MG disintegrating tablet Commonly known as:  ZOFRAN ODT Take 1 tablet (8 mg total) by mouth every 8 (eight) hours as needed for nausea or vomiting.   oseltamivir 30 MG capsule Commonly known as:  TAMIFLU Take 1 capsule (30 mg total) by mouth daily for 3 days. Start taking on:  03/13/2017   simvastatin 20 MG tablet Commonly known as:  ZOCOR Take 10 mg by mouth daily.      Follow-up Information    [name], MD Follow up in 1 week(s).   Specialty:  Internal Medicine Why:  Hospital follow up. Contact information: [place] [address] [phone]        Nahser, [name], MD .   Specialty:  Cardiology Contact information: [address] [place] [address] [phone]            Time coordinating discharge: 35 minutes.  SignedMargreta Journey Rama  Pager [phone] [facility] 03/12/2017, 10:36 AM

## 2017-03-12 NOTE — Progress Notes (Signed)
Progress Note  Patient Name: Bradley Soto Date of Encounter: 03/12/2017  Primary Cardiologist: Mertie Moores, MD  Primary Electrophysiologist: Dr. [name]  Subjective   Pt is feeling better. No chest discomfort or shortness of breath. He does not have any awareness that he is in afib. He wants to go home, "I can't take another day in this place."  Inpatient Medications    Scheduled Meds: . allopurinol  100 mg Oral BID  . amiodarone  200 mg Oral Daily  . aspirin EC  81 mg Oral Daily  . dabigatran  75 mg Oral BID  . insulin aspart  0-9 Units Subcutaneous TID WC  . insulin aspart protamine- aspart  18 Units Subcutaneous BID WC  . isosorbide mononitrate  30 mg Oral Daily  . metoprolol succinate  100 mg Oral Daily  . oseltamivir  30 mg Oral Daily  . pneumococcal 23 valent vaccine  0.5 mL Intramuscular Tomorrow-1000  . simvastatin  10 mg Oral Daily   Continuous Infusions: . sodium chloride Stopped (03/11/17 2200)   PRN Meds: acetaminophen, albuterol, ALPRAZolam, dextromethorphan-guaiFENesin, hydrALAZINE, Melatonin, morphine injection, ondansetron (ZOFRAN) IV, zolpidem   Vital Signs    Vitals:   03/11/17 1823 03/11/17 2003 03/11/17 2040 03/12/17 0500  BP: (!) 180/86 107/65  112/77  Pulse:  (!) 106  (!) 58  Resp:  (!) 21 18 16   Temp:  (!) 101.1 F (38.4 C)  98.9 F (37.2 C)  TempSrc:  Oral  Oral  SpO2:  94%  94%  Weight:    253 lb 15.5 oz (115.2 kg)  Height:        Intake/Output Summary (Last 24 hours) at 03/12/2017 0749 Last data filed at 03/12/2017 0500 Gross per 24 hour  Intake 1300 ml  Output 2375 ml  Net -1075 ml   Filed Weights   03/10/17 2046 03/11/17 0045 03/12/17 0500  Weight: 250 lb (113.4 kg) 252 lb 3.2 oz (114.4 kg) 253 lb 15.5 oz (115.2 kg)    Telemetry    Atrial fibrillation in the 100's-110's since 1845 last evening.  - Personally Reviewed  ECG    No new tracings - Personally Reviewed  Physical Exam   GEN: No acute distress.     Neck: No JVD Cardiac: Irregualrlay irregular rhythm, no murmurs, rubs, or gallops.  Respiratory: Moist expiratory wheezes throughout with increased rhonchi in R base.  GI: Soft, nontender, non-distended  MS: No edema; No deformity. Neuro:  Nonfocal  Psych: Normal affect   Labs    Chemistry Recent Labs  Lab 03/10/17 2044  NA 135  K 4.6  CL 107  CO2 15*  GLUCOSE 207*  BUN 29*  CREATININE 2.85*  CALCIUM 8.5*  GFRNONAA 20*  GFRAA 23*  ANIONGAP 13     Hematology Recent Labs  Lab 03/10/17 2044  WBC 7.3  RBC 4.49  HGB 14.1  HCT 40.9  MCV 91.1  MCH 31.4  MCHC 34.5  RDW 14.5  PLT 139*    Cardiac Enzymes Recent Labs  Lab 03/10/17 2315 03/11/17 0107 03/11/17 1023  TROPONINI 0.03* 0.04* 0.07*    Recent Labs  Lab 03/10/17 2057  TROPIPOC 0.02     BNPNo results for input(s): BNP, PROBNP in the last 168 hours.   DDimer No results for input(s): DDIMER in the last 168 hours.   Radiology    Dg Chest 2 View  Result Date: 03/10/2017 CLINICAL DATA:  Chest pain EXAM: CHEST  2 VIEW COMPARISON:  None. FINDINGS: Left-sided  pacing device with leads over the right atrium and right ventricle. No acute consolidation or effusion. Borderline cardiomegaly. Aortic atherosclerosis. No pneumothorax. IMPRESSION: No active cardiopulmonary disease.  Borderline cardiomegaly. Electronically Signed   By: Donavan Foil M.D.   On: 03/10/2017 21:25    Cardiac Studies   Echocardiogram 03/11/2017 Study Conclusions - Left ventricle: The cavity size was normal. Wall thickness was   increased in a pattern of mild LVH. There was mild focal basal   hypertrophy of the septum. Systolic function was normal. The   estimated ejection fraction was in the range of 60% to 65%. There   is akinesis of the basalinferolateral myocardium. Doppler   parameters are consistent with abnormal left ventricular   relaxation (grade 1 diastolic dysfunction). - Aortic valve: There was mild  stenosis.  Impressions: - Akinesis of the basal inferolateral wall with overall preserved   LV systolic function; mild diastolic dysfunction; mild LVH;   calcified aortic valve with mild AS (mean gradient 12 mmHg).  Patient Profile     77 y.o. male with hypertension, sick sinus syndrome status post pacemaker, CAD status post MI and PCI, CKD IV, and paroxysmal atrial fibrillation here with chest pain and shortness of breath in the setting of influenza.  Troponin is minimally elevated to 0.07.   Assessment & Plan    1. Chest pain with hx of CAD with PCI's (early 2000's) -Pt presented with non-ischemic type symptoms in the setting of positive flu. Chest pain free while here. -Troponins mildly elevated 0.07, likely due to demand ischemia in the setting of viral illness.  -With CKD stage IV and no significant angina symptoms, elected not to pursue ischemic testing that would lead to invasive procedure.  -Echocardiogram showed akinesis of the basal inferolateral wall with overall preserved LV function, EF 25-95%, mild diastolic dysfunction, mild LVH and calcified aortic valve with mild AS. Changes likely consistent with hypertensive changes. Will review with Dr. Alean Rinne.   2. CKD stage IV - Creatinine 2.85 (3.2-3.4 in February 2018)  3. PAF -On Pradaxa for stroke risk reduction. Amiodarone for rhythm contol and Toprol. -S/P PPM for SSS -Pt was in sinus rhythm on presentation, but converted to atrial fib in the 100's-110's. Pt has no awareness of being in afib. Likely related to respiratory illness with fever up to 101.1. Hopefully will resolve with recovery from illness. Would not necessarily increase BB. Will review with Dr. Oval Linsey.   4. Hypertension -Home meds include Toprol Xl 100 mg, Imdur 30 mg. -BP elevated on admission but had not taken meds. Improved today.   5. Hyperlipidemia -Continue simvastatin . LDL 48.  6. OSA -Continue CPAP at hs  7. Flu A -Management per primary  tea. Continues to have fevers up to 101. -Feels better today. Cough is not as frequent. Wants to go home.   For questions or updates, please contact [place] Please consult [email or web address] for contact info under Cardiology/STEMI.      Signed, Daune Perch, NP  03/12/2017, 7:49 AM

## 2017-03-12 NOTE — Plan of Care (Signed)
  Progressing Clinical Measurements: Ability to maintain clinical measurements within normal limits will improve 03/12/2017 0414 - Progressing by Colonel Bald, RN Note VSS Coping: Level of anxiety will decrease 03/12/2017 0414 - Progressing by Colonel Bald, RN Note No s/s of anxiety.  Elimination: Will not experience complications related to urinary retention 03/12/2017 0414 - Progressing by Colonel Bald, RN Note No complications noted.  Adequate UOP.

## 2017-03-15 NOTE — Consult Note (Signed)
            [facility] Primary Care Navigator  03/15/2017  Bradley Soto 06-Jun-1940 [id number]   Attempt to seepatient at the bedsideto identify possible discharge needs but he was alreadydischargedper staff report.  Per chart review, patient was admitted 03/10/17 for evaluation of chest pain and cough associated with fever; with troponin level mildly elevated.  Patient was discharged home over the weekend.  Patient has discharge instruction to follow-up with primary care provider with in 1 week and follow-up with cardiology as well.   For additional questions please contact:  Edwena Felty A. Elese Rane, BSN, RN-BC [facility] PRIMARY CARE Navigator Cell: [phone]

## 2017-03-16 DIAGNOSIS — I480 Paroxysmal atrial fibrillation: Secondary | ICD-10-CM | POA: Diagnosis not present

## 2017-03-16 DIAGNOSIS — I10 Essential (primary) hypertension: Secondary | ICD-10-CM | POA: Diagnosis not present

## 2017-03-16 DIAGNOSIS — R05 Cough: Secondary | ICD-10-CM | POA: Diagnosis not present

## 2017-03-16 DIAGNOSIS — Z6836 Body mass index (BMI) 36.0-36.9, adult: Secondary | ICD-10-CM | POA: Diagnosis not present

## 2017-03-16 DIAGNOSIS — E7849 Other hyperlipidemia: Secondary | ICD-10-CM | POA: Diagnosis not present

## 2017-03-16 DIAGNOSIS — J09X2 Influenza due to identified novel influenza A virus with other respiratory manifestations: Secondary | ICD-10-CM | POA: Diagnosis not present

## 2017-03-16 DIAGNOSIS — I25118 Atherosclerotic heart disease of native coronary artery with other forms of angina pectoris: Secondary | ICD-10-CM | POA: Diagnosis not present

## 2017-03-16 DIAGNOSIS — N184 Chronic kidney disease, stage 4 (severe): Secondary | ICD-10-CM | POA: Diagnosis not present

## 2017-03-16 DIAGNOSIS — G4733 Obstructive sleep apnea (adult) (pediatric): Secondary | ICD-10-CM | POA: Diagnosis not present

## 2017-03-16 DIAGNOSIS — R740 Nonspecific elevation of levels of transaminase and lactic acid dehydrogenase [LDH]: Secondary | ICD-10-CM | POA: Diagnosis not present

## 2017-03-16 DIAGNOSIS — E1165 Type 2 diabetes mellitus with hyperglycemia: Secondary | ICD-10-CM | POA: Diagnosis not present

## 2017-03-16 LAB — CULTURE, BLOOD (ROUTINE X 2)
Culture: NO GROWTH
Culture: NO GROWTH
SPECIAL REQUESTS: ADEQUATE
SPECIAL REQUESTS: ADEQUATE

## 2017-03-23 DIAGNOSIS — N184 Chronic kidney disease, stage 4 (severe): Secondary | ICD-10-CM | POA: Diagnosis not present

## 2017-03-25 ENCOUNTER — Ambulatory Visit (INDEPENDENT_AMBULATORY_CARE_PROVIDER_SITE_OTHER): Payer: Medicare Other | Admitting: Cardiovascular Disease

## 2017-03-25 ENCOUNTER — Encounter: Payer: Self-pay | Admitting: Cardiovascular Disease

## 2017-03-25 VITALS — BP 152/86 | HR 64 | Ht 71.0 in | Wt 253.8 lb

## 2017-03-25 DIAGNOSIS — I5032 Chronic diastolic (congestive) heart failure: Secondary | ICD-10-CM | POA: Diagnosis not present

## 2017-03-25 DIAGNOSIS — I2510 Atherosclerotic heart disease of native coronary artery without angina pectoris: Secondary | ICD-10-CM

## 2017-03-25 DIAGNOSIS — I480 Paroxysmal atrial fibrillation: Secondary | ICD-10-CM | POA: Diagnosis not present

## 2017-03-25 MED ORDER — HYDRALAZINE HCL 25 MG PO TABS: 25.0000 mg | ORAL_TABLET | Freq: Three times a day (TID) | ORAL | 3 refills | Status: DC

## 2017-03-25 NOTE — Progress Notes (Signed)
Bradley Soto Date of Birth  01/31/1941       [facility]    [employer] [address], [address], [place], [place]  [address]   [place], [place]  [address] [place]   Fax  [phone]     Fax [phone]  Problem List: 1. Coronary artery disease-status post stenting 2. Sick sinus syndrome-status post pacemaker. History of paroxysmal  atrial fib 3. CKD 4. Diabetes Mellitus 5. Gout 6.   Previoius Hx  Pt recently moved from [place], [place] ( near [employer])  Here to establish cardiology care. 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    March 25, 2017: Bradley Soto is seen today for a follow-up visit.  I last saw him 4 years ago.  He has been seeing Dr. Rayann Heman in the meantime.  Hx of stenting in the past  Was just in the hospital - had the flu.  ( did get the flu shot)  Echocardiogram while in the hospital shows normal left ventricular systolic function with ejection fraction of 60-65%.  He has mild left ventricular hypertrophy.  There is akinesis of the inferior basilar segment.  He has grade 1 diastolic.  dysfunction.  There is mild aortic stenosis.  Now has  No CP . Has some shortness of breath ,  Worse in the cold.   Has gained some weight - since being on Insulin    Current Outpatient Medications  Medication Sig Dispense Refill  . allopurinol (ZYLOPRIM) 100 MG tablet Take 100 mg by mouth 2 (two) times daily.    Marland Kitchen amiodarone (PACERONE) 200 MG tablet Take 100 mg by mouth daily.     . dabigatran (PRADAXA) 75 MG CAPS capsule Take 1 capsule (75 mg total) by mouth 2 (two) times daily. 180 capsule 3  . hydrALAZINE (APRESOLINE) 25 MG tablet Take 12.5 mg by mouth 3 (three) times daily.     . isosorbide mononitrate (IMDUR) 30 MG 24 hr tablet Take 30 mg by mouth daily.    . Melatonin 10 MG TABS Take 1 tablet by mouth at bedtime as needed (sleep).     . metoprolol  succinate (TOPROL-XL) 100 MG 24 hr tablet Take 100 mg by mouth daily. Take with or immediately following a meal.    . nitroGLYCERIN (NITROSTAT) 0.4 MG SL tablet Place 1 tablet (0.4 mg total) under the tongue every 5 (five) minutes as needed for chest pain. 45 tablet 6  . NOVOLOG MIX 70/30 FLEXPEN (70-30) 100 UNIT/ML FlexPen Inject 38 Units into the skin 2 (two) times daily with a meal.   3  . simvastatin (ZOCOR) 20 MG tablet Take 10 mg by mouth daily.      No current facility-administered medications for this visit.      Allergies  Allergen Reactions  . Shellfish Allergy Rash    Past Medical History:  Diagnosis Date  . CAD (coronary artery disease)    s/p stent placement  . CKD (chronic kidney disease), stage III ([place])   . Colon polyps   . Diabetes mellitus without complication ([place])   . Gout   . HLD (hyperlipidemia)   . HTN (hypertension)   . Insomnia   . MI (myocardial infarction) ([place]) 1998  . NSTEMI (non-ST elevated myocardial infarction) ([place])    s/p PCI in early 2000s  . OSA on CPAP   . SSS (sick  sinus syndrome) ([place])   . Type II or unspecified type diabetes mellitus without mention of complication, not stated as uncontrolled     Past Surgical History:  Procedure Laterality Date  . APPENDECTOMY    . CATARACT EXTRACTION    . CORONARY ANGIOPLASTY WITH STENT PLACEMENT     x4  . HERNIA REPAIR     ventral hernia repair with mesh--revision also was required  . INSERT / REPLACE / REMOVE PACEMAKER  05/10/2000   pacemaker implanted in [place]  . PACEMAKER GENERATOR CHANGE  09/03/09   MDT Adapta generator change in [place]  . PARTIAL COLON REMOVAL  2/2   polyp that could not be removed on colonoscopy    Social History   Tobacco Use  Smoking Status Never Smoker  Smokeless Tobacco Never Used    Social History   Substance and Sexual Activity  Alcohol Use No    Family History  Problem Relation Age of Onset  . Heart attack Father 85  . Heart disease Father   .  Breast cancer Sister   . Pulmonary embolism Sister   . Deep vein thrombosis Sister     Reviw of Systems:  Reviewed in the HPI.  All other systems are negative.  Physical Exam: Blood pressure (!) 152/86, pulse 64, height 5\' 11"  (1.803 m), weight 253 lb 12.8 oz (115.1 kg), SpO2 93 %.  GEN:  Well nourished, well developed in no acute distress HEENT: Normal NECK: No JVD; No carotid bruits LYMPHATICS: No lymphadenopathy CARDIAC:  RR , distant heart sounds  RESPIRATORY:   Rales in the bases  ABDOMEN: Morbid obesity. MUSCULOSKELETAL:  No edema; No deformity  SKIN: Warm and dry NEUROLOGIC:  Alert and oriented x 3  ECG:   Assessment / Plan:   1.  Coronary artery disease: Patient denies having any episodes of angina.  He had an inferior basilar myocardial infarction approximately 20 years ago.  He is status post stenting.  2.  Chronic diastolic congestive heart failure: He has normal left ventricular systolic function.  He has grade 1 diastolic dysfunction.  We discussed the importance of weight loss and blood pressure control.  3.  Hypertension: His blood pressure remains elevated.  It is clear that he still eats lots of salty foods.  We will increase the hydralazine to 25 mg 3 times a day. He will be seeing a nephrology consult next week.  4.  Chronic renal insufficiency: He will be seeing nephrology in the very near future   Mertie Moores, MD  03/25/2017 12:44 PM    [place] [address],  [place], [place]  [address] Pager [phone] Phone: [phone]; Fax: [phone]) [phone]   .

## 2017-03-25 NOTE — Patient Instructions (Signed)
Medication Instructions:  Your physician has recommended you make the following change in your medication:  INCREASE Hydralazine to 25 mg 3 times per day   Labwork: None Ordered   Testing/Procedures: None Ordered   Follow-Up: Your physician wants you to follow-up in: 6 months with Dr. [name].  You will receive a reminder letter in the mail two months in advance. If you don't receive a letter, please call our office to schedule the follow-up appointment.   If you need a refill on your cardiac medications before your next appointment, please call your pharmacy.   Thank you for choosing [facility]! [name] Bame, RN [phone]

## 2017-03-29 DIAGNOSIS — N200 Calculus of kidney: Secondary | ICD-10-CM | POA: Diagnosis not present

## 2017-03-29 DIAGNOSIS — N184 Chronic kidney disease, stage 4 (severe): Secondary | ICD-10-CM | POA: Diagnosis not present

## 2017-04-02 DIAGNOSIS — E1129 Type 2 diabetes mellitus with other diabetic kidney complication: Secondary | ICD-10-CM | POA: Diagnosis not present

## 2017-04-02 DIAGNOSIS — I5032 Chronic diastolic (congestive) heart failure: Secondary | ICD-10-CM | POA: Diagnosis not present

## 2017-04-02 DIAGNOSIS — N184 Chronic kidney disease, stage 4 (severe): Secondary | ICD-10-CM | POA: Diagnosis not present

## 2017-04-02 DIAGNOSIS — E1122 Type 2 diabetes mellitus with diabetic chronic kidney disease: Secondary | ICD-10-CM | POA: Diagnosis not present

## 2017-04-02 DIAGNOSIS — N2581 Secondary hyperparathyroidism of renal origin: Secondary | ICD-10-CM | POA: Diagnosis not present

## 2017-04-02 DIAGNOSIS — I2510 Atherosclerotic heart disease of native coronary artery without angina pectoris: Secondary | ICD-10-CM | POA: Diagnosis not present

## 2017-04-02 DIAGNOSIS — N189 Chronic kidney disease, unspecified: Secondary | ICD-10-CM | POA: Diagnosis not present

## 2017-04-02 DIAGNOSIS — I4891 Unspecified atrial fibrillation: Secondary | ICD-10-CM | POA: Diagnosis not present

## 2017-04-02 DIAGNOSIS — I129 Hypertensive chronic kidney disease with stage 1 through stage 4 chronic kidney disease, or unspecified chronic kidney disease: Secondary | ICD-10-CM | POA: Diagnosis not present

## 2017-04-04 ENCOUNTER — Other Ambulatory Visit: Payer: Self-pay | Admitting: Internal Medicine

## 2017-04-06 DIAGNOSIS — Z6835 Body mass index (BMI) 35.0-35.9, adult: Secondary | ICD-10-CM | POA: Diagnosis not present

## 2017-04-06 DIAGNOSIS — E1165 Type 2 diabetes mellitus with hyperglycemia: Secondary | ICD-10-CM | POA: Diagnosis not present

## 2017-04-06 DIAGNOSIS — N184 Chronic kidney disease, stage 4 (severe): Secondary | ICD-10-CM | POA: Diagnosis not present

## 2017-04-06 DIAGNOSIS — I10 Essential (primary) hypertension: Secondary | ICD-10-CM | POA: Diagnosis not present

## 2017-04-06 DIAGNOSIS — Z794 Long term (current) use of insulin: Secondary | ICD-10-CM | POA: Diagnosis not present

## 2017-04-21 DIAGNOSIS — N184 Chronic kidney disease, stage 4 (severe): Secondary | ICD-10-CM | POA: Diagnosis not present

## 2017-05-11 DIAGNOSIS — W57XXXA Bitten or stung by nonvenomous insect and other nonvenomous arthropods, initial encounter: Secondary | ICD-10-CM | POA: Diagnosis not present

## 2017-05-11 DIAGNOSIS — I509 Heart failure, unspecified: Secondary | ICD-10-CM | POA: Diagnosis not present

## 2017-05-11 DIAGNOSIS — Z794 Long term (current) use of insulin: Secondary | ICD-10-CM | POA: Diagnosis not present

## 2017-05-11 DIAGNOSIS — N184 Chronic kidney disease, stage 4 (severe): Secondary | ICD-10-CM | POA: Diagnosis not present

## 2017-05-11 DIAGNOSIS — D692 Other nonthrombocytopenic purpura: Secondary | ICD-10-CM | POA: Diagnosis not present

## 2017-05-11 DIAGNOSIS — E1165 Type 2 diabetes mellitus with hyperglycemia: Secondary | ICD-10-CM | POA: Diagnosis not present

## 2017-05-11 DIAGNOSIS — I10 Essential (primary) hypertension: Secondary | ICD-10-CM | POA: Diagnosis not present

## 2017-05-11 DIAGNOSIS — I480 Paroxysmal atrial fibrillation: Secondary | ICD-10-CM | POA: Diagnosis not present

## 2017-05-11 DIAGNOSIS — Z6835 Body mass index (BMI) 35.0-35.9, adult: Secondary | ICD-10-CM | POA: Diagnosis not present

## 2017-05-26 DIAGNOSIS — N184 Chronic kidney disease, stage 4 (severe): Secondary | ICD-10-CM | POA: Diagnosis not present

## 2017-05-26 DIAGNOSIS — I129 Hypertensive chronic kidney disease with stage 1 through stage 4 chronic kidney disease, or unspecified chronic kidney disease: Secondary | ICD-10-CM | POA: Diagnosis not present

## 2017-05-26 DIAGNOSIS — I5032 Chronic diastolic (congestive) heart failure: Secondary | ICD-10-CM | POA: Diagnosis not present

## 2017-05-26 DIAGNOSIS — N200 Calculus of kidney: Secondary | ICD-10-CM | POA: Diagnosis not present

## 2017-05-26 DIAGNOSIS — E1122 Type 2 diabetes mellitus with diabetic chronic kidney disease: Secondary | ICD-10-CM | POA: Diagnosis not present

## 2017-05-26 DIAGNOSIS — I4891 Unspecified atrial fibrillation: Secondary | ICD-10-CM | POA: Diagnosis not present

## 2017-07-07 DIAGNOSIS — Z794 Long term (current) use of insulin: Secondary | ICD-10-CM | POA: Diagnosis not present

## 2017-07-07 DIAGNOSIS — N184 Chronic kidney disease, stage 4 (severe): Secondary | ICD-10-CM | POA: Diagnosis not present

## 2017-07-07 DIAGNOSIS — E1165 Type 2 diabetes mellitus with hyperglycemia: Secondary | ICD-10-CM | POA: Diagnosis not present

## 2017-07-07 DIAGNOSIS — I10 Essential (primary) hypertension: Secondary | ICD-10-CM | POA: Diagnosis not present

## 2017-08-26 DIAGNOSIS — D631 Anemia in chronic kidney disease: Secondary | ICD-10-CM | POA: Diagnosis not present

## 2017-08-26 DIAGNOSIS — I4891 Unspecified atrial fibrillation: Secondary | ICD-10-CM | POA: Diagnosis not present

## 2017-08-26 DIAGNOSIS — M109 Gout, unspecified: Secondary | ICD-10-CM | POA: Diagnosis not present

## 2017-08-26 DIAGNOSIS — N2581 Secondary hyperparathyroidism of renal origin: Secondary | ICD-10-CM | POA: Diagnosis not present

## 2017-08-26 DIAGNOSIS — I5032 Chronic diastolic (congestive) heart failure: Secondary | ICD-10-CM | POA: Diagnosis not present

## 2017-08-26 DIAGNOSIS — N200 Calculus of kidney: Secondary | ICD-10-CM | POA: Diagnosis not present

## 2017-08-26 DIAGNOSIS — E1122 Type 2 diabetes mellitus with diabetic chronic kidney disease: Secondary | ICD-10-CM | POA: Diagnosis not present

## 2017-08-26 DIAGNOSIS — I129 Hypertensive chronic kidney disease with stage 1 through stage 4 chronic kidney disease, or unspecified chronic kidney disease: Secondary | ICD-10-CM | POA: Diagnosis not present

## 2017-08-26 DIAGNOSIS — N184 Chronic kidney disease, stage 4 (severe): Secondary | ICD-10-CM | POA: Diagnosis not present

## 2017-09-03 ENCOUNTER — Encounter: Payer: Self-pay | Admitting: Cardiology

## 2017-09-29 DIAGNOSIS — Z6833 Body mass index (BMI) 33.0-33.9, adult: Secondary | ICD-10-CM | POA: Diagnosis not present

## 2017-09-29 DIAGNOSIS — L988 Other specified disorders of the skin and subcutaneous tissue: Secondary | ICD-10-CM | POA: Diagnosis not present

## 2017-09-29 DIAGNOSIS — D692 Other nonthrombocytopenic purpura: Secondary | ICD-10-CM | POA: Diagnosis not present

## 2017-09-29 DIAGNOSIS — N184 Chronic kidney disease, stage 4 (severe): Secondary | ICD-10-CM | POA: Diagnosis not present

## 2017-09-29 DIAGNOSIS — I10 Essential (primary) hypertension: Secondary | ICD-10-CM | POA: Diagnosis not present

## 2017-09-29 DIAGNOSIS — E1169 Type 2 diabetes mellitus with other specified complication: Secondary | ICD-10-CM | POA: Diagnosis not present

## 2017-09-29 DIAGNOSIS — L738 Other specified follicular disorders: Secondary | ICD-10-CM | POA: Diagnosis not present

## 2017-09-29 DIAGNOSIS — E7849 Other hyperlipidemia: Secondary | ICD-10-CM | POA: Diagnosis not present

## 2017-09-29 DIAGNOSIS — Z794 Long term (current) use of insulin: Secondary | ICD-10-CM | POA: Diagnosis not present

## 2017-10-11 DIAGNOSIS — C44629 Squamous cell carcinoma of skin of left upper limb, including shoulder: Secondary | ICD-10-CM | POA: Diagnosis not present

## 2017-10-29 DIAGNOSIS — Z23 Encounter for immunization: Secondary | ICD-10-CM | POA: Diagnosis not present

## 2017-11-09 DIAGNOSIS — I10 Essential (primary) hypertension: Secondary | ICD-10-CM | POA: Diagnosis not present

## 2017-11-09 DIAGNOSIS — Z6833 Body mass index (BMI) 33.0-33.9, adult: Secondary | ICD-10-CM | POA: Diagnosis not present

## 2017-11-09 DIAGNOSIS — Z794 Long term (current) use of insulin: Secondary | ICD-10-CM | POA: Diagnosis not present

## 2017-11-09 DIAGNOSIS — N184 Chronic kidney disease, stage 4 (severe): Secondary | ICD-10-CM | POA: Diagnosis not present

## 2017-11-09 DIAGNOSIS — E1169 Type 2 diabetes mellitus with other specified complication: Secondary | ICD-10-CM | POA: Diagnosis not present

## 2017-11-11 DIAGNOSIS — Z85828 Personal history of other malignant neoplasm of skin: Secondary | ICD-10-CM | POA: Diagnosis not present

## 2017-11-11 DIAGNOSIS — Z08 Encounter for follow-up examination after completed treatment for malignant neoplasm: Secondary | ICD-10-CM | POA: Diagnosis not present

## 2017-11-11 DIAGNOSIS — X32XXXD Exposure to sunlight, subsequent encounter: Secondary | ICD-10-CM | POA: Diagnosis not present

## 2017-11-11 DIAGNOSIS — L570 Actinic keratosis: Secondary | ICD-10-CM | POA: Diagnosis not present

## 2017-11-11 DIAGNOSIS — C44319 Basal cell carcinoma of skin of other parts of face: Secondary | ICD-10-CM | POA: Diagnosis not present

## 2017-11-24 DIAGNOSIS — E1122 Type 2 diabetes mellitus with diabetic chronic kidney disease: Secondary | ICD-10-CM | POA: Diagnosis not present

## 2017-11-24 DIAGNOSIS — N184 Chronic kidney disease, stage 4 (severe): Secondary | ICD-10-CM | POA: Diagnosis not present

## 2017-11-24 DIAGNOSIS — D631 Anemia in chronic kidney disease: Secondary | ICD-10-CM | POA: Diagnosis not present

## 2017-11-24 DIAGNOSIS — I129 Hypertensive chronic kidney disease with stage 1 through stage 4 chronic kidney disease, or unspecified chronic kidney disease: Secondary | ICD-10-CM | POA: Diagnosis not present

## 2017-11-24 DIAGNOSIS — N2581 Secondary hyperparathyroidism of renal origin: Secondary | ICD-10-CM | POA: Diagnosis not present

## 2018-01-11 DIAGNOSIS — R82998 Other abnormal findings in urine: Secondary | ICD-10-CM | POA: Diagnosis not present

## 2018-01-11 DIAGNOSIS — M109 Gout, unspecified: Secondary | ICD-10-CM | POA: Diagnosis not present

## 2018-01-11 DIAGNOSIS — E7849 Other hyperlipidemia: Secondary | ICD-10-CM | POA: Diagnosis not present

## 2018-01-11 DIAGNOSIS — N184 Chronic kidney disease, stage 4 (severe): Secondary | ICD-10-CM | POA: Diagnosis not present

## 2018-01-11 DIAGNOSIS — E1169 Type 2 diabetes mellitus with other specified complication: Secondary | ICD-10-CM | POA: Diagnosis not present

## 2018-01-11 DIAGNOSIS — Z125 Encounter for screening for malignant neoplasm of prostate: Secondary | ICD-10-CM | POA: Diagnosis not present

## 2018-01-18 DIAGNOSIS — Z0000 Encounter for general adult medical examination without abnormal findings: Secondary | ICD-10-CM | POA: Diagnosis not present

## 2018-01-18 DIAGNOSIS — I509 Heart failure, unspecified: Secondary | ICD-10-CM | POA: Diagnosis not present

## 2018-01-18 DIAGNOSIS — N184 Chronic kidney disease, stage 4 (severe): Secondary | ICD-10-CM | POA: Diagnosis not present

## 2018-01-18 DIAGNOSIS — Z1389 Encounter for screening for other disorder: Secondary | ICD-10-CM | POA: Diagnosis not present

## 2018-01-18 DIAGNOSIS — Z6833 Body mass index (BMI) 33.0-33.9, adult: Secondary | ICD-10-CM | POA: Diagnosis not present

## 2018-01-18 DIAGNOSIS — I10 Essential (primary) hypertension: Secondary | ICD-10-CM | POA: Diagnosis not present

## 2018-01-18 DIAGNOSIS — I495 Sick sinus syndrome: Secondary | ICD-10-CM | POA: Diagnosis not present

## 2018-01-18 DIAGNOSIS — I25118 Atherosclerotic heart disease of native coronary artery with other forms of angina pectoris: Secondary | ICD-10-CM | POA: Diagnosis not present

## 2018-01-18 DIAGNOSIS — I130 Hypertensive heart and chronic kidney disease with heart failure and stage 1 through stage 4 chronic kidney disease, or unspecified chronic kidney disease: Secondary | ICD-10-CM | POA: Diagnosis not present

## 2018-01-18 DIAGNOSIS — Z794 Long term (current) use of insulin: Secondary | ICD-10-CM | POA: Diagnosis not present

## 2018-01-18 DIAGNOSIS — E1121 Type 2 diabetes mellitus with diabetic nephropathy: Secondary | ICD-10-CM | POA: Diagnosis not present

## 2018-01-18 DIAGNOSIS — D692 Other nonthrombocytopenic purpura: Secondary | ICD-10-CM | POA: Diagnosis not present

## 2018-01-24 DIAGNOSIS — X32XXXD Exposure to sunlight, subsequent encounter: Secondary | ICD-10-CM | POA: Diagnosis not present

## 2018-01-24 DIAGNOSIS — Z85828 Personal history of other malignant neoplasm of skin: Secondary | ICD-10-CM | POA: Diagnosis not present

## 2018-01-24 DIAGNOSIS — Z08 Encounter for follow-up examination after completed treatment for malignant neoplasm: Secondary | ICD-10-CM | POA: Diagnosis not present

## 2018-01-24 DIAGNOSIS — L570 Actinic keratosis: Secondary | ICD-10-CM | POA: Diagnosis not present

## 2018-02-21 ENCOUNTER — Telehealth: Payer: Self-pay | Admitting: Cardiovascular Disease

## 2018-02-21 ENCOUNTER — Telehealth: Payer: Self-pay | Admitting: Cardiology

## 2018-02-21 NOTE — Telephone Encounter (Signed)
New Message        Patient is having irregular heart beat, need new Rx written (Diltiazem 60 mg) for this and the earliest  appt is 03/16/18. FYI this Rx has never been written by Dr. [name].

## 2018-02-21 NOTE — Telephone Encounter (Signed)
Spoke w/ pt wife and informed her that pt is overdue to see MD. She agreed to an appt on 04-07-2018 at 2:00 PM. She also agreed to send a remote transmission today.

## 2018-02-21 NOTE — Telephone Encounter (Signed)
Left message for patient to call back  

## 2018-02-21 NOTE — Telephone Encounter (Signed)
Received call from patient's wife, Clarise Cruz, who states patient has been having problems with irregular heart beat recently. She states he feels better today but likes to take Diltiazem 60 mg when episodes occur. States he has an old Rx from a previous doctor and would like to have a new Rx to keep on hand for break-through a fib. She confirms that patient is taking Toprol XL 100 mg daily and Amiodarone 200 mg daily I asked about HR readings and wife reports patient has BP monitor but she does not know readings. He has [employer] PPM Patient has an appointment later this month with Dr. [name] but would like Rx for Diltiazem to take PRN prior to appointment if possible. I advised I will forward to Dr. [name] for advice and call them back later. Wife verbalized understanding and agreement.

## 2018-02-21 NOTE — Telephone Encounter (Signed)
-----   Message from Emmaline Life, RN sent at 02/21/2018 11:07 AM EST ----- I spoke with this patient's wife today and she states it has been a while since patient has sent a transmission. Looks like last transmission was 5/18. Please contact patient if necessary.  Thanks! Sharyn Lull

## 2018-02-21 NOTE — Telephone Encounter (Signed)
LMOVM for pt to return call. Pt needs to send remote transmission today and schedule OV w/ MD.

## 2018-02-22 ENCOUNTER — Ambulatory Visit (INDEPENDENT_AMBULATORY_CARE_PROVIDER_SITE_OTHER): Payer: Medicare Other

## 2018-02-22 DIAGNOSIS — I495 Sick sinus syndrome: Secondary | ICD-10-CM | POA: Diagnosis not present

## 2018-02-22 MED ORDER — DILTIAZEM HCL 60 MG PO TABS: 60.0000 mg | ORAL_TABLET | ORAL | 6 refills | Status: DC | PRN

## 2018-02-22 NOTE — Telephone Encounter (Signed)
Confirmed with DPR (patient's wife) he only takes Diltiazem maybe once monthly.  Will call in a few tablets with refills to [facility] [address]. She was grateful for call and agrees with treatment plan.   Confirmed with pharmacy PRN dose is fine to take with Simvastatin 20 mg daily.

## 2018-02-22 NOTE — Telephone Encounter (Signed)
OK to prescribe Diltiazem 60 mg tabs to take as needed for breakthrough AF. Please call patient to verify pharmacy and number of tablets that he typically takes in a month   Thanks   Phil

## 2018-02-23 NOTE — Progress Notes (Signed)
Remote pacemaker transmission.   

## 2018-02-24 LAB — CUP PACEART REMOTE DEVICE CHECK
Battery Impedance: 1904 Ohm
Battery Remaining Longevity: 32 mo
Battery Voltage: 2.77 V
Brady Statistic AP VP Percent: 0 %
Brady Statistic AS VP Percent: 0 %
Implantable Lead Implant Date: 20020325
Implantable Lead Implant Date: 20020325
Implantable Lead Location: 753860
Implantable Lead Model: 4092
Implantable Lead Model: 4524
Implantable Pulse Generator Implant Date: 20110719
Lead Channel Impedance Value: 501 Ohm
Lead Channel Pacing Threshold Amplitude: 1.25 V
Lead Channel Pacing Threshold Pulse Width: 0.4 ms
Lead Channel Setting Pacing Amplitude: 2.5 V
Lead Channel Setting Pacing Amplitude: 2.5 V
Lead Channel Setting Pacing Pulse Width: 0.4 ms
Lead Channel Setting Sensing Sensitivity: 5.6 mV
MDC IDC LEAD LOCATION: 753859
MDC IDC MSMT LEADCHNL RA PACING THRESHOLD PULSEWIDTH: 0.4 ms
MDC IDC MSMT LEADCHNL RV IMPEDANCE VALUE: 694 Ohm
MDC IDC MSMT LEADCHNL RV PACING THRESHOLD AMPLITUDE: 0.625 V
MDC IDC SESS DTM: 20200106212406
MDC IDC STAT BRADY AP VS PERCENT: 98 %
MDC IDC STAT BRADY AS VS PERCENT: 2 %

## 2018-03-01 DIAGNOSIS — E1169 Type 2 diabetes mellitus with other specified complication: Secondary | ICD-10-CM | POA: Diagnosis not present

## 2018-03-01 DIAGNOSIS — N184 Chronic kidney disease, stage 4 (severe): Secondary | ICD-10-CM | POA: Diagnosis not present

## 2018-03-01 DIAGNOSIS — I480 Paroxysmal atrial fibrillation: Secondary | ICD-10-CM | POA: Diagnosis not present

## 2018-03-01 DIAGNOSIS — Z794 Long term (current) use of insulin: Secondary | ICD-10-CM | POA: Diagnosis not present

## 2018-03-01 DIAGNOSIS — I10 Essential (primary) hypertension: Secondary | ICD-10-CM | POA: Diagnosis not present

## 2018-03-02 DIAGNOSIS — D631 Anemia in chronic kidney disease: Secondary | ICD-10-CM | POA: Diagnosis not present

## 2018-03-02 DIAGNOSIS — M109 Gout, unspecified: Secondary | ICD-10-CM | POA: Diagnosis not present

## 2018-03-02 DIAGNOSIS — N200 Calculus of kidney: Secondary | ICD-10-CM | POA: Diagnosis not present

## 2018-03-02 DIAGNOSIS — I129 Hypertensive chronic kidney disease with stage 1 through stage 4 chronic kidney disease, or unspecified chronic kidney disease: Secondary | ICD-10-CM | POA: Diagnosis not present

## 2018-03-02 DIAGNOSIS — N184 Chronic kidney disease, stage 4 (severe): Secondary | ICD-10-CM | POA: Diagnosis not present

## 2018-03-02 DIAGNOSIS — N2581 Secondary hyperparathyroidism of renal origin: Secondary | ICD-10-CM | POA: Diagnosis not present

## 2018-03-07 DIAGNOSIS — L570 Actinic keratosis: Secondary | ICD-10-CM | POA: Diagnosis not present

## 2018-03-07 DIAGNOSIS — X32XXXD Exposure to sunlight, subsequent encounter: Secondary | ICD-10-CM | POA: Diagnosis not present

## 2018-03-16 ENCOUNTER — Ambulatory Visit (INDEPENDENT_AMBULATORY_CARE_PROVIDER_SITE_OTHER): Payer: Medicare Other | Admitting: Cardiovascular Disease

## 2018-03-16 ENCOUNTER — Encounter: Payer: Self-pay | Admitting: Cardiovascular Disease

## 2018-03-16 VITALS — BP 136/82 | HR 73 | Ht 71.0 in | Wt 238.8 lb

## 2018-03-16 DIAGNOSIS — I480 Paroxysmal atrial fibrillation: Secondary | ICD-10-CM

## 2018-03-16 DIAGNOSIS — I2510 Atherosclerotic heart disease of native coronary artery without angina pectoris: Secondary | ICD-10-CM

## 2018-03-16 DIAGNOSIS — I5032 Chronic diastolic (congestive) heart failure: Secondary | ICD-10-CM

## 2018-03-16 MED ORDER — DILTIAZEM HCL 60 MG PO TABS: 60.0000 mg | ORAL_TABLET | Freq: Three times a day (TID) | ORAL | 6 refills | Status: DC | PRN

## 2018-03-16 NOTE — Patient Instructions (Signed)
Medication Instructions:  Your physician recommends that you continue on your current medications as directed. Please refer to the Current Medication list given to you today.  If you need a refill on your cardiac medications before your next appointment, please call your pharmacy.   Lab work: TODAY - basic metabolic panel  If you have labs (blood work) drawn today and your tests are completely normal, you will receive your results only by: Marland Kitchen MyChart Message (if you have MyChart) OR . A paper copy in the mail If you have any lab test that is abnormal or we need to change your treatment, we will call you to review the results.   Testing/Procedures: None Ordered   Follow-Up: At [facility], you and your health needs are our priority.  As part of our continuing mission to provide you with exceptional heart care, we have created designated Provider Care Teams.  These Care Teams include your primary Cardiologist (physician) and Advanced Practice Providers (APPs -  Physician Assistants and Nurse Practitioners) who all work together to provide you with the care you need, when you need it. You will need a follow up appointment in:  1 years.  Please call our office 2 months in advance to schedule this appointment.  You may see Mertie Moores, MD or one of the following Advanced Practice Providers on your designated Care Team: Richardson Dopp, PA-C [place], [place] . Daune Perch, NP

## 2018-03-16 NOTE — Progress Notes (Signed)
Bradley Soto Date of Birth  [birth year]       [facility]    [employer] [address] [place], [place], [place]  [address]   [place], [place]  [address] [place]   Fax  [phone]     Fax [phone]  Problem List: 1. Coronary artery disease-status post stenting 2. Sick sinus syndrome-status post pacemaker. History of paroxysmal  atrial fib 3. CKD 4. Diabetes Mellitus 5. Gout 6.   Previoius Hx  Pt recently moved from [place], [place] ( near [employer])  Here to establish cardiology care. 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    March 25, 2017: Bradley Soto is seen today for a follow-up visit.  I last saw him 4 years ago.  He has been seeing Dr. Rayann Heman in the meantime.  Hx of stenting in the past  Was just in the hospital - had the flu.  ( did get the flu shot)  Echocardiogram while in the hospital shows normal left ventricular systolic function with ejection fraction of 60-65%.  He has mild left ventricular hypertrophy.  There is akinesis of the inferior basilar segment.  He has grade 1 diastolic.  dysfunction.  There is mild aortic stenosis.  Now has  No CP . Has some shortness of breath ,  Worse in the cold.   Has gained some weight - since being on Insulin   Jan. 29, 2020   Doing well  He describes having some palpitations that lasted about a month.  He thinks they started around mid December and lasted through the first part of January. It is him as needed for these palpitations.  They seem to have resolved.  He was wondering if they are related to his Tylenol PM. Today shows atrial pacing.  There is no arrhythmias. I snot exercising   Current Outpatient Medications  Medication Sig Dispense Refill  . allopurinol (ZYLOPRIM) 100 MG tablet Take 100 mg by mouth 2 (two) times daily.    Marland Kitchen amiodarone (PACERONE) 200 MG tablet Take 100 mg by mouth daily.      Marland Kitchen diltiazem (CARDIZEM) 60 MG tablet Take 1 tablet (60 mg total) by mouth as needed (palpitations). 5 tablet 6  . isosorbide mononitrate (IMDUR) 30 MG 24 hr tablet Take 30 mg by mouth daily.    . Melatonin 10 MG TABS Take 1 tablet by mouth at bedtime as needed (sleep).     . metoprolol succinate (TOPROL-XL) 100 MG 24 hr tablet Take 100 mg by mouth daily. Take with or immediately following a meal.    . nitroGLYCERIN (NITROSTAT) 0.4 MG SL tablet Place 1 tablet (0.4 mg total) under the tongue every 5 (five) minutes as needed for chest pain. 45 tablet 6  . NOVOLOG MIX 70/30 FLEXPEN (70-30) 100 UNIT/ML FlexPen Inject 38 Units into the skin 2 (two) times daily with a meal.   3  . PRADAXA 75 MG CAPS capsule TAKE ONE CAPSULE BY MOUTH TWICE A DAY 180 capsule 1  . simvastatin (ZOCOR) 20 MG tablet Take 10 mg by mouth daily.     . hydrALAZINE (APRESOLINE) 25 MG tablet Take 1 tablet (25 mg total) by mouth 3 (three) times daily. 270 tablet 3   No current facility-administered medications for this visit.      Allergies  Allergen Reactions  . Shellfish Allergy Rash  Past Medical History:  Diagnosis Date  . CAD (coronary artery disease)    s/p stent placement  . CKD (chronic kidney disease), stage III ([place])   . Colon polyps   . Diabetes mellitus without complication ([place])   . Gout   . HLD (hyperlipidemia)   . HTN (hypertension)   . Insomnia   . MI (myocardial infarction) ([place]) 1998  . NSTEMI (non-ST elevated myocardial infarction) ([place])    s/p PCI in early 2000s  . OSA on CPAP   . SSS (sick sinus syndrome) ([place])   . Type II or unspecified type diabetes mellitus without mention of complication, not stated as uncontrolled     Past Surgical History:  Procedure Laterality Date  . APPENDECTOMY    . CATARACT EXTRACTION    . CORONARY ANGIOPLASTY WITH STENT PLACEMENT     x4  . HERNIA REPAIR     ventral hernia repair with mesh--revision also was required  . INSERT / REPLACE / REMOVE  PACEMAKER  05/10/2000   pacemaker implanted in [place]  . PACEMAKER GENERATOR CHANGE  09/03/09   MDT Adapta generator change in [place]  . PARTIAL COLON REMOVAL  2/2   polyp that could not be removed on colonoscopy    Social History   Tobacco Use  Smoking Status Never Smoker  Smokeless Tobacco Never Used    Social History   Substance and Sexual Activity  Alcohol Use No    Family History  Problem Relation Age of Onset  . Heart attack Father 31  . Heart disease Father   . Breast cancer Sister   . Pulmonary embolism Sister   . Deep vein thrombosis Sister     Reviw of Systems:  Reviewed in the HPI.  All other systems are negative.  Physical Exam: Blood pressure 136/82, pulse 73, height 5\' 11"  (1.803 m), weight 238 lb 12.8 oz (108.3 kg).  GEN:  Elderly male,  NAD  HEENT: Normal NECK: No JVD; No carotid bruits LYMPHATICS: No lymphadenopathy CARDIAC: RRR   RESPIRATORY:  Clear to auscultation without rales, wheezing or rhonchi  ABDOMEN: Soft, non-tender, non-distended MUSCULOSKELETAL:  No edema; No deformity  SKIN: Warm and dry NEUROLOGIC:  Alert and oriented x 3  ECG: March 16, 2018: Atrial pacing with prolonged AV conduction.  Previous inferior wall myocardial infarction.   Assessment / Plan:   1.  Coronary artery disease:  No CP .   2.  Chronic diastolic congestive heart failure: no dyspnea   3.  Hypertension: Blood pressure seems to be better controlled.  To new current medication  4.  Chronic renal insufficiency: Sees the doctors at [facility].  5.  Paroxysmal atrial fibrillation: He had some palpitations that lasted off and on for about a month.  He has a known history of paroxysmal atrial fibrillation.  Continue Eliquis.  Of asked him to let the electrophysiology doctors know about the palpitations.  They should be able to interrogate the pacer and see what happened last month.   Mertie Moores, MD  03/16/2018 9:53 AM    [place],  [place], [place]  [address] Pager [phone] Phone: [phone]; Fax: [phone]) [phone]   .

## 2018-04-07 ENCOUNTER — Ambulatory Visit (INDEPENDENT_AMBULATORY_CARE_PROVIDER_SITE_OTHER): Payer: Medicare Other | Admitting: Internal Medicine

## 2018-04-07 ENCOUNTER — Encounter: Payer: Self-pay | Admitting: Internal Medicine

## 2018-04-07 VITALS — BP 126/76 | HR 71 | Ht 71.0 in | Wt 242.8 lb

## 2018-04-07 DIAGNOSIS — Z950 Presence of cardiac pacemaker: Secondary | ICD-10-CM | POA: Diagnosis not present

## 2018-04-07 DIAGNOSIS — I495 Sick sinus syndrome: Secondary | ICD-10-CM | POA: Diagnosis not present

## 2018-04-07 DIAGNOSIS — I480 Paroxysmal atrial fibrillation: Secondary | ICD-10-CM | POA: Diagnosis not present

## 2018-04-07 DIAGNOSIS — I2510 Atherosclerotic heart disease of native coronary artery without angina pectoris: Secondary | ICD-10-CM

## 2018-04-07 MED ORDER — AMIODARONE HCL 200 MG PO TABS: 200.0000 mg | ORAL_TABLET | Freq: Every day | ORAL | 3 refills | Status: DC

## 2018-04-07 NOTE — Progress Notes (Signed)
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 male who presents today for routine electrophysiology followup.  Since last being seen in our clinic, the patient reports doing very well.  He has OSA and uses CPAP.  Not very active.  He has occasional palpitations, mostly at night which he attributes to afib.  Today, he denies symptoms of chest pain, shortness of breath,  lower extremity edema, dizziness, presyncope, or syncope.  The patient is otherwise without complaint today.   Past Medical History:  Diagnosis Date  . CAD (coronary artery disease)    s/p stent placement  . CKD (chronic kidney disease), stage III ([place])   . Colon polyps   . Diabetes mellitus without complication ([place])   . Gout   . HLD (hyperlipidemia)   . HTN (hypertension)   . Insomnia   . MI (myocardial infarction) ([place]) 1998  . NSTEMI (non-ST elevated myocardial infarction) ([place])    s/p PCI in early 2000s  . OSA on CPAP   . SSS (sick sinus syndrome) ([place])   . Type II or unspecified type diabetes mellitus without mention of complication, not stated as uncontrolled    Past Surgical History:  Procedure Laterality Date  . APPENDECTOMY    . CATARACT EXTRACTION    . CORONARY ANGIOPLASTY WITH STENT PLACEMENT     x4  . HERNIA REPAIR     ventral hernia repair with mesh--revision also was required  . INSERT / REPLACE / REMOVE PACEMAKER  05/10/2000   pacemaker implanted in [place]  . PACEMAKER GENERATOR CHANGE  09/03/09   MDT Adapta generator change in [place]  . PARTIAL COLON REMOVAL  2/2   polyp that could not be removed on colonoscopy    ROS- all systems are reviewed and negative except as per HPI above  Current Outpatient Medications  Medication Sig Dispense Refill  . allopurinol (ZYLOPRIM) 100 MG tablet Take 100 mg by mouth 2 (two) times daily.    Marland Kitchen amiodarone (PACERONE) 200 MG tablet Take 100 mg by mouth daily.     Marland Kitchen amLODipine (NORVASC) 2.5  MG tablet Take 1 tablet by mouth daily.    Marland Kitchen apixaban (ELIQUIS) 5 MG TABS tablet Take 5 mg by mouth 2 (two) times daily.    Marland Kitchen diltiazem (CARDIZEM) 60 MG tablet Take 1 tablet (60 mg total) by mouth 3 (three) times daily with meals as needed (palpitations). 30 tablet 6  . isosorbide mononitrate (IMDUR) 30 MG 24 hr tablet Take 30 mg by mouth daily.    . Melatonin 10 MG TABS Take 1 tablet by mouth at bedtime as needed (sleep).     . metoprolol succinate (TOPROL-XL) 100 MG 24 hr tablet Take 100 mg by mouth daily. Take with or immediately following a meal.    . nitroGLYCERIN (NITROSTAT) 0.4 MG SL tablet Place 1 tablet (0.4 mg total) under the tongue every 5 (five) minutes as needed for chest pain. 45 tablet 6  . NOVOLOG MIX 70/30 FLEXPEN (70-30) 100 UNIT/ML FlexPen Inject 38 Units into the skin 2 (two) times daily with a meal.   3  . simvastatin (ZOCOR) 10 MG tablet Take 1 tablet by mouth daily.    . hydrALAZINE (APRESOLINE) 25 MG tablet Take 1 tablet (25 mg total) by mouth 3 (three) times daily. 270 tablet 3   No current facility-administered medications for this visit.     Physical Exam: Vitals:   04/07/18  1357  BP: 126/76  Pulse: 71  SpO2: 96%  Weight: 242 lb 12.8 oz (110.1 kg)  Height: 5\' 11"  (1.803 m)    GEN- The patient is overweight appearing, alert and oriented x 3 today.   Head- normocephalic, atraumatic Eyes-  Sclera clear, conjunctiva pink Ears- hearing intact Oropharynx- clear Lungs- Clear to ausculation bilaterally, normal work of breathing Chest- pacemaker pocket is well healed Heart- Regular rate and rhythm, no murmurs, rubs or gallops, PMI not laterally displaced GI- soft, NT, ND, + BS Extremities- no clubbing, cyanosis, or edema  Pacemaker interrogation- reviewed in detail today,  See PACEART report  ekg tracing ordered today is personally reviewed and shows atrial paced rhythm, PR 264 msec  Assessment and Plan:  1. Symptomatic sinus bradycardia  Normal pacemaker  function See Pace Art report No changes today  2. aftrial fibrillation Well controlled with amiodarone afib burden is 1 % PCP following LFTs, TFTs On eliquis for stroke prevention We discussed ablation vs adjustment of AAD therapy today.  He is clear that he does not wish to make changes. Lifestyle modification with regular exercise and weight reduction would probably reduce his AF burden  3. CAD No ischemic symptoms No changes  4. OSA Compliant with CPAP  5. Obesity Body mass index is 33.86 kg/m. Lifestyle modification encouraged  Has MyCarelink Smart for remote monitoring Follow-up with Dr [name] in 6 months Return to see EP NP in 1 year [name] MD, [facility]  04/07/2018 2:08 PM

## 2018-04-07 NOTE — Patient Instructions (Addendum)
Medication Instructions:  Your physician recommends that you continue on your current medications as directed. Please refer to the Current Medication list given to you today.  Labwork: None ordered.  Testing/Procedures: None ordered.  Follow-Up: Your physician wants you to follow-up in: one year with Chanetta Marshall, NP.   You will receive a reminder letter in the mail two months in advance. If you don't receive a letter, please call our office to schedule the follow-up appointment.  Remote monitoring is used to monitor your Pacemaker from home. This monitoring reduces the number of office visits required to check your device to one time per year. It allows [place] to keep an eye on the functioning of your device to ensure it is working properly. You are scheduled for a device check from home on 05/24/2018. You may send your transmission at any time that day. If you have a wireless device, the transmission will be sent automatically. After your physician reviews your transmission, you will receive a postcard with your next transmission date.  Any Other Special Instructions Will Be Listed Below (If Applicable).  If you need a refill on your cardiac medications before your next appointment, please call your pharmacy.

## 2018-04-08 LAB — CUP PACEART INCLINIC DEVICE CHECK
Battery Impedance: 1997 Ohm
Battery Remaining Longevity: 27 mo
Battery Voltage: 2.76 V
Brady Statistic AP VP Percent: 0 %
Brady Statistic AP VS Percent: 98 %
Brady Statistic AS VP Percent: 0 %
Brady Statistic AS VS Percent: 2 %
Date Time Interrogation Session: 20200220205446
Implantable Lead Implant Date: 20020325
Implantable Lead Implant Date: 20020325
Implantable Lead Location: 753860
Implantable Lead Model: 4092
Implantable Lead Model: 4524
Implantable Pulse Generator Implant Date: 20110719
Lead Channel Impedance Value: 500 Ohm
Lead Channel Impedance Value: 719 Ohm
Lead Channel Pacing Threshold Amplitude: 0.625 V
Lead Channel Pacing Threshold Amplitude: 1.375 V
Lead Channel Pacing Threshold Pulse Width: 0.4 ms
Lead Channel Pacing Threshold Pulse Width: 0.4 ms
Lead Channel Setting Pacing Amplitude: 2.5 V
Lead Channel Setting Pacing Amplitude: 2.75 V
Lead Channel Setting Pacing Pulse Width: 0.4 ms
Lead Channel Setting Sensing Sensitivity: 5.6 mV
MDC IDC LEAD LOCATION: 753859

## 2018-05-24 ENCOUNTER — Other Ambulatory Visit: Payer: Self-pay

## 2018-05-24 ENCOUNTER — Ambulatory Visit (INDEPENDENT_AMBULATORY_CARE_PROVIDER_SITE_OTHER): Payer: Medicare Other | Admitting: *Deleted

## 2018-05-24 DIAGNOSIS — I495 Sick sinus syndrome: Secondary | ICD-10-CM

## 2018-05-24 DIAGNOSIS — I480 Paroxysmal atrial fibrillation: Secondary | ICD-10-CM

## 2018-05-25 ENCOUNTER — Telehealth: Payer: Self-pay

## 2018-05-25 NOTE — Telephone Encounter (Signed)
Spoke with patient to remind of missed remote transmission 

## 2018-05-26 LAB — CUP PACEART REMOTE DEVICE CHECK
Battery Impedance: 2055 Ohm
Battery Remaining Longevity: 29 mo
Battery Voltage: 2.76 V
Brady Statistic AP VP Percent: 0 %
Brady Statistic AP VS Percent: 96 %
Brady Statistic AS VP Percent: 0 %
Brady Statistic AS VS Percent: 4 %
Date Time Interrogation Session: 20200408205643
Implantable Lead Implant Date: 20020325
Implantable Lead Implant Date: 20020325
Implantable Lead Location: 753859
Implantable Lead Location: 753860
Implantable Lead Model: 4092
Implantable Lead Model: 4524
Implantable Pulse Generator Implant Date: 20110719
Lead Channel Impedance Value: 473 Ohm
Lead Channel Impedance Value: 689 Ohm
Lead Channel Pacing Threshold Amplitude: 0.75 V
Lead Channel Pacing Threshold Amplitude: 1.25 V
Lead Channel Pacing Threshold Pulse Width: 0.4 ms
Lead Channel Pacing Threshold Pulse Width: 0.4 ms
Lead Channel Setting Pacing Amplitude: 2.5 V
Lead Channel Setting Pacing Amplitude: 2.5 V
Lead Channel Setting Pacing Pulse Width: 0.4 ms
Lead Channel Setting Sensing Sensitivity: 5.6 mV

## 2018-06-02 NOTE — Progress Notes (Signed)
Remote pacemaker transmission.   

## 2018-06-06 ENCOUNTER — Other Ambulatory Visit: Payer: Self-pay | Admitting: Cardiovascular Disease

## 2018-06-14 DIAGNOSIS — E1121 Type 2 diabetes mellitus with diabetic nephropathy: Secondary | ICD-10-CM | POA: Diagnosis not present

## 2018-06-14 DIAGNOSIS — N184 Chronic kidney disease, stage 4 (severe): Secondary | ICD-10-CM | POA: Diagnosis not present

## 2018-06-14 DIAGNOSIS — I25118 Atherosclerotic heart disease of native coronary artery with other forms of angina pectoris: Secondary | ICD-10-CM | POA: Diagnosis not present

## 2018-06-14 DIAGNOSIS — I480 Paroxysmal atrial fibrillation: Secondary | ICD-10-CM | POA: Diagnosis not present

## 2018-06-14 DIAGNOSIS — I130 Hypertensive heart and chronic kidney disease with heart failure and stage 1 through stage 4 chronic kidney disease, or unspecified chronic kidney disease: Secondary | ICD-10-CM | POA: Diagnosis not present

## 2018-06-14 DIAGNOSIS — E1169 Type 2 diabetes mellitus with other specified complication: Secondary | ICD-10-CM | POA: Diagnosis not present

## 2018-06-14 DIAGNOSIS — D692 Other nonthrombocytopenic purpura: Secondary | ICD-10-CM | POA: Diagnosis not present

## 2018-06-14 DIAGNOSIS — G4733 Obstructive sleep apnea (adult) (pediatric): Secondary | ICD-10-CM | POA: Diagnosis not present

## 2018-08-23 ENCOUNTER — Ambulatory Visit (INDEPENDENT_AMBULATORY_CARE_PROVIDER_SITE_OTHER): Payer: Medicare Other | Admitting: *Deleted

## 2018-08-23 DIAGNOSIS — I495 Sick sinus syndrome: Secondary | ICD-10-CM | POA: Diagnosis not present

## 2018-08-24 LAB — CUP PACEART REMOTE DEVICE CHECK
Battery Impedance: 2243 Ohm
Battery Remaining Longevity: 27 mo
Battery Voltage: 2.76 V
Brady Statistic AP VP Percent: 0 %
Brady Statistic AP VS Percent: 94 %
Brady Statistic AS VP Percent: 0 %
Brady Statistic AS VS Percent: 6 %
Date Time Interrogation Session: 20200707201043
Implantable Lead Implant Date: 20020325
Implantable Lead Implant Date: 20020325
Implantable Lead Location: 753859
Implantable Lead Location: 753860
Implantable Lead Model: 4092
Implantable Lead Model: 4524
Implantable Pulse Generator Implant Date: 20110719
Lead Channel Impedance Value: 492 Ohm
Lead Channel Impedance Value: 721 Ohm
Lead Channel Pacing Threshold Amplitude: 0.625 V
Lead Channel Pacing Threshold Amplitude: 1.25 V
Lead Channel Pacing Threshold Pulse Width: 0.4 ms
Lead Channel Pacing Threshold Pulse Width: 0.4 ms
Lead Channel Setting Pacing Amplitude: 2.5 V
Lead Channel Setting Pacing Amplitude: 2.5 V
Lead Channel Setting Pacing Pulse Width: 0.4 ms
Lead Channel Setting Sensing Sensitivity: 5.6 mV

## 2018-09-04 ENCOUNTER — Encounter: Payer: Self-pay | Admitting: Cardiology

## 2018-09-04 NOTE — Progress Notes (Signed)
Remote pacemaker transmission.   

## 2018-10-05 DIAGNOSIS — Z23 Encounter for immunization: Secondary | ICD-10-CM | POA: Diagnosis not present

## 2018-11-22 ENCOUNTER — Ambulatory Visit: Payer: Medicare Other | Admitting: *Deleted

## 2018-11-23 LAB — CUP PACEART REMOTE DEVICE CHECK
Battery Impedance: 2206 Ohm
Battery Remaining Longevity: 28 mo
Battery Voltage: 2.76 V
Brady Statistic AP VP Percent: 0 %
Brady Statistic AP VS Percent: 90 %
Brady Statistic AS VP Percent: 0 %
Brady Statistic AS VS Percent: 10 %
Date Time Interrogation Session: 20201006195809
Implantable Lead Implant Date: 20020325
Implantable Lead Implant Date: 20020325
Implantable Lead Location: 753859
Implantable Lead Location: 753860
Implantable Lead Model: 4092
Implantable Lead Model: 4524
Implantable Pulse Generator Implant Date: 20110719
Lead Channel Impedance Value: 478 Ohm
Lead Channel Impedance Value: 723 Ohm
Lead Channel Pacing Threshold Amplitude: 0.625 V
Lead Channel Pacing Threshold Amplitude: 1.25 V
Lead Channel Pacing Threshold Pulse Width: 0.4 ms
Lead Channel Pacing Threshold Pulse Width: 0.4 ms
Lead Channel Setting Pacing Amplitude: 2.5 V
Lead Channel Setting Pacing Amplitude: 2.5 V
Lead Channel Setting Pacing Pulse Width: 0.4 ms
Lead Channel Setting Sensing Sensitivity: 5.6 mV

## 2018-11-24 ENCOUNTER — Emergency Department (HOSPITAL_COMMUNITY)
Admission: EM | Admit: 2018-11-24 | Discharge: 2018-11-24 | Disposition: A | Discharge status: Home / Self Care | Payer: Medicare Other | Attending: Emergency Medicine | Admitting: Emergency Medicine

## 2018-11-24 ENCOUNTER — Other Ambulatory Visit: Payer: Self-pay

## 2018-11-24 ENCOUNTER — Emergency Department (HOSPITAL_COMMUNITY): Payer: Medicare Other

## 2018-11-24 DIAGNOSIS — R079 Chest pain, unspecified: Secondary | ICD-10-CM | POA: Diagnosis not present

## 2018-11-24 DIAGNOSIS — Z5321 Procedure and treatment not carried out due to patient leaving prior to being seen by health care provider: Secondary | ICD-10-CM | POA: Insufficient documentation

## 2018-11-24 DIAGNOSIS — R0602 Shortness of breath: Secondary | ICD-10-CM | POA: Diagnosis not present

## 2018-11-24 LAB — CBC
HCT: 39.4 % (ref 39.0–52.0)
Hemoglobin: 13.2 g/dL (ref 13.0–17.0)
MCH: 31.7 pg (ref 26.0–34.0)
MCHC: 33.5 g/dL (ref 30.0–36.0)
MCV: 94.5 fL (ref 80.0–100.0)
Platelets: 157 10*3/uL (ref 150–400)
RBC: 4.17 MIL/uL — ABNORMAL LOW (ref 4.22–5.81)
RDW: 15.3 % (ref 11.5–15.5)
WBC: 8.2 10*3/uL (ref 4.0–10.5)
nRBC: 0 % (ref 0.0–0.2)

## 2018-11-24 LAB — TROPONIN I (HIGH SENSITIVITY): Troponin I (High Sensitivity): 7 ng/L (ref <18)

## 2018-11-24 LAB — BASIC METABOLIC PANEL
Anion gap: 10 (ref 5–15)
BUN: 44 mg/dL — ABNORMAL HIGH (ref 8–23)
CO2: 22 mmol/L (ref 22–32)
Calcium: 9.1 mg/dL (ref 8.9–10.3)
Chloride: 106 mmol/L (ref 98–111)
Creatinine, Ser: 3.46 mg/dL — ABNORMAL HIGH (ref 0.61–1.24)
GFR calc Af Amer: 18 mL/min — ABNORMAL LOW (ref >60)
GFR calc non Af Amer: 16 mL/min — ABNORMAL LOW (ref >60)
Glucose, Bld: 298 mg/dL — ABNORMAL HIGH (ref 70–99)
Potassium: 4.8 mmol/L (ref 3.5–5.1)
Sodium: 138 mmol/L (ref 135–145)

## 2018-11-24 MED ORDER — SODIUM CHLORIDE 0.9% FLUSH: 3.0000 mL | Freq: Once | INTRAVENOUS | Status: DC

## 2018-11-24 NOTE — ED Triage Notes (Addendum)
Pt complaining of chest pain since yesterday on exertion. Pain don't radiate. Pt has a history of stents 20 years ago. Pt chest pain is currently 0/10, but he experiences 7/10 chest pain on exertion.

## 2018-11-24 NOTE — ED Notes (Signed)
Pt gave his labels to registration and told them he would rather "die at home than die in the waiting room." Registration informed this RN that he had left.

## 2018-11-25 ENCOUNTER — Telehealth: Payer: Self-pay | Admitting: Cardiovascular Disease

## 2018-11-25 DIAGNOSIS — I2510 Atherosclerotic heart disease of native coronary artery without angina pectoris: Secondary | ICD-10-CM

## 2018-11-25 MED ORDER — NITROGLYCERIN 0.4 MG SL SUBL: 0.4000 mg | SUBLINGUAL_TABLET | SUBLINGUAL | 6 refills | Status: DC | PRN

## 2018-11-25 NOTE — Telephone Encounter (Signed)
New messages:    Patient wife calling stating that they went to the ER last night. They left because they felt unsafe. They would like to come in and see some one. Please call back.

## 2018-11-25 NOTE — Telephone Encounter (Signed)
Spoke with patient's wife who states patient is having midsternal chest pain that started yesterday. States it is worse with movement and does not radiate to neck or arm. I asked if it is similar to pain he had prior to stent several years ago and she states he had radiation to neck at arm prior to previous MI. He has not taken NTG because he has run out. I advised that I will send a refill to his pharmacy. Patient went to the ER last night and had EKG and troponin level x 1. Wife states they left before being seen by a provider because they felt that the environment was unsafe. She denies that he has been doing any unusual activities and no hx of GERD. He denies SOB with exertion. She asks if her husband can be seen today. I advised that I will review concerns with Dr. [name] and call her back because there are no appointments available today. She verbalized understanding and agreement with plan. Per Dr. [name], there are no EKG changes and troponin level is normal. He advised patient should use NTG and if pain worsens or if he has concerns, he should call 911. I have scheduled patient to see Dr. [name] on Monday 10/12. Wife is aware of Dr. Elmarie Shiley advice and agrees with plan. She thanked me for my help.

## 2018-11-27 NOTE — Progress Notes (Signed)
[place] Date of Birth  Apr 24, 1940       [facility]    [employer] [address], [place], [place]  [address]   [place], [place]  [address] [place]   Fax  [phone]     Fax [phone]  Problem List: 1. Coronary artery disease-status post stenting 2. Sick sinus syndrome-status post pacemaker. History of paroxysmal  atrial fib 3. CKD 4. Diabetes Mellitus 5. Gout 6.   Previoius Hx  Pt recently moved from [place], [place] ( near [employer])  Here to establish cardiology care. 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    March 25, 2017: [place] is seen today for a follow-up visit.  I last saw him 4 years ago.  He has been seeing Dr. Rayann Heman in the meantime.  Hx of stenting in the past  Was just in the hospital - had the flu.  ( did get the flu shot)  Echocardiogram while in the hospital shows normal left ventricular systolic function with ejection fraction of 60-65%.  He has mild left ventricular hypertrophy.  There is akinesis of the inferior basilar segment.  He has grade 1 diastolic.  dysfunction.  There is mild aortic stenosis.  Now has  No CP . Has some shortness of breath ,  Worse in the cold.   Has gained some weight - since being on Insulin   Jan. 29, 2020   Doing well  He describes having some palpitations that lasted about a month.  He thinks they started around mid December and lasted through the first part of January. It is him as needed for these palpitations.  They seem to have resolved.  He was wondering if they are related to his Tylenol PM. Today shows atrial pacing.  There is no arrhythmias. Is not exercising   Oct. 12, 2020   [place] is seen today for follow up . Hx of previous CAD and previous coronary stenting .   [place] was seen in the ER on Oct. 8 but left the waiting room because of prolonged wait time. Has mid  sternal chest pain . Seems worse with movement,  No radiation.  Did not have NTG to take   CP started a week ago .    Has occurred 3-4 times.  Woke up with CP last night Typically occurs at night while sleeping .  Does have CP while walking to the mailbox   Will last for several minutes.   Center of chest,  Radiates to his throat.  This pain is very similar to his previous episodes of CP  Pain was relieved with SL NTG   Not associated with deep breath , not associated with twisting his body .    Current Outpatient Medications  Medication Sig Dispense Refill  . allopurinol (ZYLOPRIM) 100 MG tablet Take 100 mg by mouth 2 (two) times daily.    Marland Kitchen amiodarone (PACERONE) 200 MG tablet Take 1 tablet (200 mg total) by mouth daily. 90 tablet 3  . amLODipine (NORVASC) 2.5 MG tablet Take 1 tablet by mouth daily.    Marland Kitchen apixaban (ELIQUIS) 5 MG TABS tablet Take 5 mg by mouth 2 (two) times daily.    Marland Kitchen diltiazem (CARDIZEM) 60 MG tablet Take 1 tablet (60 mg total) by mouth 3 (three) times daily with meals as needed (palpitations). 30 tablet 6  .  hydrALAZINE (APRESOLINE) 25 MG tablet TAKE 1 TABLET BY MOUTH THREE TIMES A DAY 270 tablet 02  . isosorbide mononitrate (IMDUR) 30 MG 24 hr tablet Take 30 mg by mouth daily.    . Melatonin 10 MG TABS Take 1 tablet by mouth at bedtime as needed (sleep).     . metoprolol succinate (TOPROL-XL) 100 MG 24 hr tablet Take 100 mg by mouth daily. Take with or immediately following a meal.    . nitroGLYCERIN (NITROSTAT) 0.4 MG SL tablet Place 1 tablet (0.4 mg total) under the tongue every 5 (five) minutes as needed for chest pain. 25 tablet 6  . NOVOLOG MIX 70/30 FLEXPEN (70-30) 100 UNIT/ML FlexPen Inject 38 Units into the skin 2 (two) times daily with a meal.   3  . simvastatin (ZOCOR) 10 MG tablet Take 1 tablet by mouth daily.     No current facility-administered medications for this visit.      Allergies  Allergen Reactions  . Shellfish Allergy Rash    Past  Medical History:  Diagnosis Date  . CAD (coronary artery disease)    s/p stent placement  . CKD (chronic kidney disease), stage III ([place])   . Colon polyps   . Diabetes mellitus without complication ([place])   . Gout   . HLD (hyperlipidemia)   . HTN (hypertension)   . Insomnia   . MI (myocardial infarction) ([place]) 1998  . NSTEMI (non-ST elevated myocardial infarction) ([place])    s/p PCI in early 2000s  . OSA on CPAP   . SSS (sick sinus syndrome) ([place])   . Type II or unspecified type diabetes mellitus without mention of complication, not stated as uncontrolled     Past Surgical History:  Procedure Laterality Date  . APPENDECTOMY    . CATARACT EXTRACTION    . CORONARY ANGIOPLASTY WITH STENT PLACEMENT     x4  . HERNIA REPAIR     ventral hernia repair with mesh--revision also was required  . INSERT / REPLACE / REMOVE PACEMAKER  05/10/2000   pacemaker implanted in [place]  . PACEMAKER GENERATOR CHANGE  09/03/09   MDT Adapta generator change in [place]  . PARTIAL COLON REMOVAL  2/2   polyp that could not be removed on colonoscopy    Social History   Tobacco Use  Smoking Status Never Smoker  Smokeless Tobacco Never Used    Social History   Substance and Sexual Activity  Alcohol Use No    Family History  Problem Relation Age of Onset  . Heart attack Father 66  . Heart disease Father   . Breast cancer Sister   . Pulmonary embolism Sister   . Deep vein thrombosis Sister     Reviw of Systems:  Reviewed in the HPI.  All other systems are negative.  Physical Exam: There were no vitals taken for this visit.  GEN:  Well nourished, well developed in no acute distress HEENT: Normal NECK: No JVD; No carotid bruits LYMPHATICS: No lymphadenopathy CARDIAC: RRR , no murmurs, rubs, gallops RESPIRATORY:  Clear to auscultation without rales, wheezing or rhonchi  ABDOMEN: Soft, non-tender, non-distended MUSCULOSKELETAL:  No edema; No deformity  SKIN: Warm and dry NEUROLOGIC:   Alert and oriented x 3   ECG:    11/25/18:  NSR  , RBBB ,  Previous Inf. MI    Assessment / Plan:   1.  Coronary artery disease: [place] presents with symptoms that are fairly typical of his angina.  He has had the  symptoms for the past week or so.  He gets it intense chest tightness that is very severe.  It tends to radiate up to his throat.  It feels identical to his previous episodes of angina.  It last for several minutes and has been promptly relieved with sublingual nitroglycerin.  We have scheduled him for heart catheterization on Wednesday.  He will hold his Eliquis starting now.  We will start him on aspirin 81 mg a day.  We discussed risks, benefits, options.  He understands and agrees to proceed.  2.  Chronic diastolic congestive heart failure:  Advised salt restriction .     3.  Hypertension:  BP slithgly up today .     4.  Chronic renal insufficiency:    5.  Paroxysmal atrial fibrillation:  Stable for now.   Holding eliquis for several days for cath     Mertie Moores, MD  11/27/2018 9:14 PM    [address] [place],  [place], [place]  [address] Pager [phone] Phone: [phone]; Fax: [phone]) [phone]   .

## 2018-11-27 NOTE — H&P (View-Only) (Signed)
Bradley Soto Date of Birth  Nov 09, 1940       [facility]    [employer] [address], [place], [place]  [address]   [place], [place]  [address] [place]   Fax  [phone]     Fax [phone]  Problem List: 1. Coronary artery disease-status post stenting 2. Sick sinus syndrome-status post pacemaker. History of paroxysmal  atrial fib 3. CKD 4. Diabetes Mellitus 5. Gout 6.   Previoius Hx  Pt recently moved from [place], [place] ( [place])  Here to establish cardiology care. 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    March 25, 2017: [place] is seen today for a follow-up visit.  I last saw him 4 years ago.  He has been seeing Dr. Rayann Heman in the meantime.  Hx of stenting in the past  Was just in the hospital - had the flu.  ( did get the flu shot)  Echocardiogram while in the hospital shows normal left ventricular systolic function with ejection fraction of 60-65%.  He has mild left ventricular hypertrophy.  There is akinesis of the inferior basilar segment.  He has grade 1 diastolic.  dysfunction.  There is mild aortic stenosis.  Now has  No CP . Has some shortness of breath ,  Worse in the cold.   Has gained some weight - since being on Insulin   Jan. 29, 2020   Doing well  He describes having some palpitations that lasted about a month.  He thinks they started around mid December and lasted through the first part of January. It is him as needed for these palpitations.  They seem to have resolved.  He was wondering if they are related to his Tylenol PM. Today shows atrial pacing.  There is no arrhythmias. Is not exercising   Oct. 12, 2020   [place] is seen today for follow up . Hx of previous CAD and previous coronary stenting .   [place] was seen in the ER on Oct. 8 but left the waiting room because of prolonged wait time. Has mid  sternal chest pain . Seems worse with movement,  No radiation.  Did not have NTG to take   CP started a week ago .    Has occurred 3-4 times.  Woke up with CP last night Typically occurs at night while sleeping .  Does have CP while walking to the mailbox   Will last for several minutes.   Center of chest,  Radiates to his throat.  This pain is very similar to his previous episodes of CP  Pain was relieved with SL NTG   Not associated with deep breath , not associated with twisting his body .    Current Outpatient Medications  Medication Sig Dispense Refill  . allopurinol (ZYLOPRIM) 100 MG tablet Take 100 mg by mouth 2 (two) times daily.    Marland Kitchen amiodarone (PACERONE) 200 MG tablet Take 1 tablet (200 mg total) by mouth daily. 90 tablet 3  . amLODipine (NORVASC) 2.5 MG tablet Take 1 tablet by mouth daily.    Marland Kitchen apixaban (ELIQUIS) 5 MG TABS tablet Take 5 mg by mouth 2 (two) times daily.    Marland Kitchen diltiazem (CARDIZEM) 60 MG tablet Take 1 tablet (60 mg total) by mouth 3 (three) times daily with meals as needed (palpitations). 30 tablet 6  .  hydrALAZINE (APRESOLINE) 25 MG tablet TAKE 1 TABLET BY MOUTH THREE TIMES A DAY 270 tablet 02  . isosorbide mononitrate (IMDUR) 30 MG 24 hr tablet Take 30 mg by mouth daily.    . Melatonin 10 MG TABS Take 1 tablet by mouth at bedtime as needed (sleep).     . metoprolol succinate (TOPROL-XL) 100 MG 24 hr tablet Take 100 mg by mouth daily. Take with or immediately following a meal.    . nitroGLYCERIN (NITROSTAT) 0.4 MG SL tablet Place 1 tablet (0.4 mg total) under the tongue every 5 (five) minutes as needed for chest pain. 25 tablet 6  . NOVOLOG MIX 70/30 FLEXPEN (70-30) 100 UNIT/ML FlexPen Inject 38 Units into the skin 2 (two) times daily with a meal.   3  . simvastatin (ZOCOR) 10 MG tablet Take 1 tablet by mouth daily.     No current facility-administered medications for this visit.      Allergies  Allergen Reactions  . Shellfish Allergy Rash    Past  Medical History:  Diagnosis Date  . CAD (coronary artery disease)    s/p stent placement  . CKD (chronic kidney disease), stage III ([place])   . Colon polyps   . Diabetes mellitus without complication ([place])   . Gout   . HLD (hyperlipidemia)   . HTN (hypertension)   . Insomnia   . MI (myocardial infarction) ([place]) 1998  . NSTEMI (non-ST elevated myocardial infarction) ([place])    s/p PCI in early 2000s  . OSA on CPAP   . SSS (sick sinus syndrome) ([place])   . Type II or unspecified type diabetes mellitus without mention of complication, not stated as uncontrolled     Past Surgical History:  Procedure Laterality Date  . APPENDECTOMY    . CATARACT EXTRACTION    . CORONARY ANGIOPLASTY WITH STENT PLACEMENT     x4  . HERNIA REPAIR     ventral hernia repair with mesh--revision also was required  . INSERT / REPLACE / REMOVE PACEMAKER  05/10/2000   pacemaker implanted in [place]  . PACEMAKER GENERATOR CHANGE  09/03/09   MDT Adapta generator change in [place]  . PARTIAL COLON REMOVAL  2/2   polyp that could not be removed on colonoscopy    Social History   Tobacco Use  Smoking Status Never Smoker  Smokeless Tobacco Never Used    Social History   Substance and Sexual Activity  Alcohol Use No    Family History  Problem Relation Age of Onset  . Heart attack Father 32  . Heart disease Father   . Breast cancer Sister   . Pulmonary embolism Sister   . Deep vein thrombosis Sister     Reviw of Systems:  Reviewed in the HPI.  All other systems are negative.  Physical Exam: There were no vitals taken for this visit.  GEN:  Well nourished, well developed in no acute distress HEENT: Normal NECK: No JVD; No carotid bruits LYMPHATICS: No lymphadenopathy CARDIAC: RRR , no murmurs, rubs, gallops RESPIRATORY:  Clear to auscultation without rales, wheezing or rhonchi  ABDOMEN: Soft, non-tender, non-distended MUSCULOSKELETAL:  No edema; No deformity  SKIN: Warm and dry NEUROLOGIC:   Alert and oriented x 3   ECG:    11/25/18:  NSR  , RBBB ,  Previous Inf. MI    Assessment / Plan:   1.  Coronary artery disease: [place] presents with symptoms that are fairly typical of his angina.  He has had the  symptoms for the past week or so.  He gets it intense chest tightness that is very severe.  It tends to radiate up to his throat.  It feels identical to his previous episodes of angina.  It last for several minutes and has been promptly relieved with sublingual nitroglycerin.  We have scheduled him for heart catheterization on Wednesday.  He will hold his Eliquis starting now.  We will start him on aspirin 81 mg a day.  We discussed risks, benefits, options.  He understands and agrees to proceed.  2.  Chronic diastolic congestive heart failure:  Advised salt restriction .     3.  Hypertension:  BP slithgly up today .     4.  Chronic renal insufficiency:    5.  Paroxysmal atrial fibrillation:  Stable for now.   Holding eliquis for several days for cath     Mertie Moores, MD  11/27/2018 9:14 PM    [address] [place],  [place], [place]  [address] Pager [phone] Phone: [phone]; Fax: [phone]) [phone]   .

## 2018-11-28 ENCOUNTER — Ambulatory Visit (INDEPENDENT_AMBULATORY_CARE_PROVIDER_SITE_OTHER): Payer: Medicare Other | Admitting: Cardiovascular Disease

## 2018-11-28 ENCOUNTER — Encounter: Payer: Self-pay | Admitting: Cardiovascular Disease

## 2018-11-28 ENCOUNTER — Other Ambulatory Visit: Payer: Self-pay

## 2018-11-28 ENCOUNTER — Encounter (INDEPENDENT_AMBULATORY_CARE_PROVIDER_SITE_OTHER): Payer: Self-pay

## 2018-11-28 VITALS — BP 162/94 | HR 62 | Ht 72.0 in | Wt 241.8 lb

## 2018-11-28 DIAGNOSIS — I480 Paroxysmal atrial fibrillation: Secondary | ICD-10-CM | POA: Diagnosis not present

## 2018-11-28 DIAGNOSIS — I25110 Atherosclerotic heart disease of native coronary artery with unstable angina pectoris: Secondary | ICD-10-CM | POA: Diagnosis not present

## 2018-11-28 NOTE — Patient Instructions (Addendum)
Medication Instructions:  Your physician has recommended you make the following change in your medication:  TAKE Aspirin 81 mg tomorrow and Wednesday   If you need a refill on your cardiac medications before your next appointment, please call your pharmacy.   Lab work: TODAY - CBC, BMET If you have labs (blood work) drawn today and your tests are completely normal, you will receive your results only by: Marland Kitchen MyChart Message (if you have MyChart) OR . A paper copy in the mail If you have any lab test that is abnormal or we need to change your treatment, we will call you to review the results.   Follow-Up: At [facility], you and your health needs are our priority.  As part of our continuing mission to provide you with exceptional heart care, we have created designated Provider Care Teams.  These Care Teams include your primary Cardiologist (physician) and Advanced Practice Providers (APPs -  Physician Assistants and Nurse Practitioners) who all work together to provide you with the care you need, when you need it. You will need a follow up appointment in:  6 weeks.  Mertie Moores, MD has advised that you seem Bradley Lis, PA-C on Tuesday Nov. 24 at 10:00 am   Testing/Procedures:   You are scheduled for a Cardiac Catheterization on Wednesday, October 14 with Dr. Lauree Chandler.  1. Please arrive at the [facility] (Main Entrance A) at [facility]: [address] [place], [place] [address] at 8:00 AM (This time is two hours before your procedure to ensure your preparation). Free valet parking service is available.   Special note: Every effort is made to have your procedure done on time. Please understand that emergencies sometimes delay scheduled procedures.  2. Diet: Do not eat solid foods after midnight.  The patient may have clear liquids until 5am upon the day of the procedure.  3. Your Pre-procedure COVID-19 Testing will be done on Tuesday Oct. 13 at [address], [place]. After your swab you will be given a mask to wear and instructed to go home and quarantine/no visitors until after your procedure. If you test positive you will be notified and your procedure will be cancelled.    4. Medication instructions in preparation for your procedure:   Contrast Allergy: No   Stop taking Eliquis (Apixiban) on Monday, October 12.  Take 1/2 insulin dose on Tuesday night Do not take Furosemide (Lasix) Wednesday morning  On the morning of your procedure, take your Aspirin and any morning medicines NOT listed above.  You may use sips of water.  5. Plan for one night stay--bring personal belongings. 6. Bring a current list of your medications and current insurance cards. 7. You MUST have a responsible person to drive you home. 8. Someone MUST be with you the first 24 hours after you arrive home or your discharge will be delayed. 9. Please wear clothes that are easy to get on and off and wear slip-on shoes.  Thank you for allowing [place] to care for you!   -- [facility]

## 2018-11-29 ENCOUNTER — Telehealth: Payer: Self-pay | Admitting: *Deleted

## 2018-11-29 ENCOUNTER — Other Ambulatory Visit (HOSPITAL_COMMUNITY)
Admission: RE | Admit: 2018-11-29 | Discharge: 2018-11-29 | Disposition: A | Discharge status: Home / Self Care | Payer: Medicare Other | Source: Ambulatory Visit | Attending: Cardiovascular Disease | Admitting: Cardiovascular Disease

## 2018-11-29 DIAGNOSIS — Z01812 Encounter for preprocedural laboratory examination: Secondary | ICD-10-CM | POA: Insufficient documentation

## 2018-11-29 DIAGNOSIS — Z20828 Contact with and (suspected) exposure to other viral communicable diseases: Secondary | ICD-10-CM | POA: Insufficient documentation

## 2018-11-29 LAB — SARS CORONAVIRUS 2 (TAT 6-24 HRS): SARS Coronavirus 2: NEGATIVE

## 2018-11-29 NOTE — Telephone Encounter (Addendum)
Pt contacted pre-catheterization scheduled at [facility] for: Wednesday November 30, 2018 1 PM Verified arrival time and place: [place] [facility]) at: 10 AM pre procedure hydration 11/27/08 K 5.4 -per Dr Janet Berlin on arrival to Short Stay.  No solid food after midnight prior to cath, clear liquids until 5 AM day of procedure. Contrast allergy: pt's wife states no contrast allergy to her knowledge.  Hold: Lasix-day before and day of procedure. Insulin-AM of procedure Eliquis-last dose AM 11/28/18 until post procedure  Except hold medications AM meds can be  taken pre-cath with sip of water including: ASA 81 mg   Confirmed patient has responsible adult to drive home post procedure and observe 24 hours after arriving home: yes  Currently, due to Covid-19 pandemic, only one support person will be allowed with patient. Must be the same support person for that patient's entire stay, will be screened and required to wear a mask. They will be asked to wait in the waiting room for the duration of the patient's stay.  Patients are required to wear a mask when they enter the hospital.      COVID-19 Pre-Screening Questions:  . In the past 7 to 10 days have you had a cough,  shortness of breath, headache, congestion, fever (100 or greater) body aches, chills, sore throat, or sudden loss of taste or sense of smell? no . Have you been around anyone with known Covid 19? no . Have you been around anyone who is awaiting Covid 19 test results in the past 7 to 10 days? no . Have you been around anyone who has been exposed to Covid 19, or has mentioned symptoms of Covid 19 within the past 7 to 10 days? no  I reviewed procedure/mask/visitor instructions, Covid-19 screening questions with patient's wife (DPR), Bradley Soto, she verbalized understanding, thanked me for call.

## 2018-11-30 ENCOUNTER — Other Ambulatory Visit: Payer: Self-pay

## 2018-11-30 ENCOUNTER — Encounter (HOSPITAL_COMMUNITY)
Admission: RE | Disposition: A | Discharge status: Home / Self Care | Payer: Self-pay | Source: Home / Self Care | Attending: Cardiothoracic Surgery

## 2018-11-30 ENCOUNTER — Encounter (HOSPITAL_COMMUNITY): Payer: Self-pay

## 2018-11-30 ENCOUNTER — Inpatient Hospital Stay (HOSPITAL_COMMUNITY)
Admission: RE | Admit: 2018-11-30 | Discharge: 2018-12-11 | DRG: 234 | Disposition: A | Discharge status: Home / Self Care | Payer: Medicare Other | Attending: Cardiothoracic Surgery | Admitting: Cardiothoracic Surgery

## 2018-11-30 ENCOUNTER — Other Ambulatory Visit: Payer: Self-pay | Admitting: *Deleted

## 2018-11-30 DIAGNOSIS — D631 Anemia in chronic kidney disease: Secondary | ICD-10-CM | POA: Diagnosis present

## 2018-11-30 DIAGNOSIS — Z794 Long term (current) use of insulin: Secondary | ICD-10-CM

## 2018-11-30 DIAGNOSIS — E11649 Type 2 diabetes mellitus with hypoglycemia without coma: Secondary | ICD-10-CM | POA: Diagnosis not present

## 2018-11-30 DIAGNOSIS — E041 Nontoxic single thyroid nodule: Secondary | ICD-10-CM | POA: Diagnosis not present

## 2018-11-30 DIAGNOSIS — D62 Acute posthemorrhagic anemia: Secondary | ICD-10-CM | POA: Diagnosis not present

## 2018-11-30 DIAGNOSIS — Z91013 Allergy to seafood: Secondary | ICD-10-CM

## 2018-11-30 DIAGNOSIS — I700 Atherosclerosis of aorta: Secondary | ICD-10-CM | POA: Diagnosis not present

## 2018-11-30 DIAGNOSIS — I130 Hypertensive heart and chronic kidney disease with heart failure and stage 1 through stage 4 chronic kidney disease, or unspecified chronic kidney disease: Secondary | ICD-10-CM | POA: Diagnosis present

## 2018-11-30 DIAGNOSIS — E785 Hyperlipidemia, unspecified: Secondary | ICD-10-CM | POA: Diagnosis present

## 2018-11-30 DIAGNOSIS — Z01818 Encounter for other preprocedural examination: Secondary | ICD-10-CM | POA: Diagnosis not present

## 2018-11-30 DIAGNOSIS — E669 Obesity, unspecified: Secondary | ICD-10-CM | POA: Diagnosis present

## 2018-11-30 DIAGNOSIS — Z6833 Body mass index (BMI) 33.0-33.9, adult: Secondary | ICD-10-CM

## 2018-11-30 DIAGNOSIS — Z79899 Other long term (current) drug therapy: Secondary | ICD-10-CM | POA: Diagnosis not present

## 2018-11-30 DIAGNOSIS — Z955 Presence of coronary angioplasty implant and graft: Secondary | ICD-10-CM

## 2018-11-30 DIAGNOSIS — Z950 Presence of cardiac pacemaker: Secondary | ICD-10-CM

## 2018-11-30 DIAGNOSIS — Z7901 Long term (current) use of anticoagulants: Secondary | ICD-10-CM

## 2018-11-30 DIAGNOSIS — Z8679 Personal history of other diseases of the circulatory system: Secondary | ICD-10-CM

## 2018-11-30 DIAGNOSIS — I4891 Unspecified atrial fibrillation: Secondary | ICD-10-CM | POA: Diagnosis present

## 2018-11-30 DIAGNOSIS — I25118 Atherosclerotic heart disease of native coronary artery with other forms of angina pectoris: Secondary | ICD-10-CM | POA: Diagnosis not present

## 2018-11-30 DIAGNOSIS — I517 Cardiomegaly: Secondary | ICD-10-CM | POA: Diagnosis not present

## 2018-11-30 DIAGNOSIS — J90 Pleural effusion, not elsewhere classified: Secondary | ICD-10-CM | POA: Diagnosis not present

## 2018-11-30 DIAGNOSIS — Z20828 Contact with and (suspected) exposure to other viral communicable diseases: Secondary | ICD-10-CM | POA: Diagnosis not present

## 2018-11-30 DIAGNOSIS — Z9049 Acquired absence of other specified parts of digestive tract: Secondary | ICD-10-CM

## 2018-11-30 DIAGNOSIS — N1832 Chronic kidney disease, stage 3b: Secondary | ICD-10-CM | POA: Diagnosis not present

## 2018-11-30 DIAGNOSIS — Z8719 Personal history of other diseases of the digestive system: Secondary | ICD-10-CM

## 2018-11-30 DIAGNOSIS — I10 Essential (primary) hypertension: Secondary | ICD-10-CM | POA: Diagnosis not present

## 2018-11-30 DIAGNOSIS — Z9889 Other specified postprocedural states: Secondary | ICD-10-CM

## 2018-11-30 DIAGNOSIS — N184 Chronic kidney disease, stage 4 (severe): Secondary | ICD-10-CM | POA: Diagnosis not present

## 2018-11-30 DIAGNOSIS — I25110 Atherosclerotic heart disease of native coronary artery with unstable angina pectoris: Principal | ICD-10-CM

## 2018-11-30 DIAGNOSIS — Z9989 Dependence on other enabling machines and devices: Secondary | ICD-10-CM

## 2018-11-30 DIAGNOSIS — G4733 Obstructive sleep apnea (adult) (pediatric): Secondary | ICD-10-CM

## 2018-11-30 DIAGNOSIS — M109 Gout, unspecified: Secondary | ICD-10-CM | POA: Diagnosis present

## 2018-11-30 DIAGNOSIS — I200 Unstable angina: Secondary | ICD-10-CM | POA: Diagnosis present

## 2018-11-30 DIAGNOSIS — Z8249 Family history of ischemic heart disease and other diseases of the circulatory system: Secondary | ICD-10-CM

## 2018-11-30 DIAGNOSIS — Z951 Presence of aortocoronary bypass graft: Secondary | ICD-10-CM

## 2018-11-30 DIAGNOSIS — J9811 Atelectasis: Secondary | ICD-10-CM | POA: Diagnosis not present

## 2018-11-30 DIAGNOSIS — T82855A Stenosis of coronary artery stent, initial encounter: Secondary | ICD-10-CM | POA: Diagnosis not present

## 2018-11-30 DIAGNOSIS — E119 Type 2 diabetes mellitus without complications: Secondary | ICD-10-CM | POA: Diagnosis not present

## 2018-11-30 DIAGNOSIS — Z803 Family history of malignant neoplasm of breast: Secondary | ICD-10-CM

## 2018-11-30 DIAGNOSIS — I495 Sick sinus syndrome: Secondary | ICD-10-CM | POA: Diagnosis present

## 2018-11-30 DIAGNOSIS — I6521 Occlusion and stenosis of right carotid artery: Secondary | ICD-10-CM | POA: Diagnosis present

## 2018-11-30 DIAGNOSIS — E1122 Type 2 diabetes mellitus with diabetic chronic kidney disease: Secondary | ICD-10-CM | POA: Diagnosis not present

## 2018-11-30 DIAGNOSIS — J439 Emphysema, unspecified: Secondary | ICD-10-CM | POA: Diagnosis not present

## 2018-11-30 DIAGNOSIS — I454 Nonspecific intraventricular block: Secondary | ICD-10-CM | POA: Diagnosis not present

## 2018-11-30 DIAGNOSIS — I2510 Atherosclerotic heart disease of native coronary artery without angina pectoris: Secondary | ICD-10-CM

## 2018-11-30 DIAGNOSIS — I5032 Chronic diastolic (congestive) heart failure: Secondary | ICD-10-CM | POA: Diagnosis not present

## 2018-11-30 DIAGNOSIS — E782 Mixed hyperlipidemia: Secondary | ICD-10-CM | POA: Diagnosis not present

## 2018-11-30 DIAGNOSIS — I480 Paroxysmal atrial fibrillation: Secondary | ICD-10-CM | POA: Diagnosis present

## 2018-11-30 DIAGNOSIS — I252 Old myocardial infarction: Secondary | ICD-10-CM

## 2018-11-30 DIAGNOSIS — E162 Hypoglycemia, unspecified: Secondary | ICD-10-CM | POA: Diagnosis not present

## 2018-11-30 DIAGNOSIS — N183 Chronic kidney disease, stage 3 unspecified: Secondary | ICD-10-CM | POA: Diagnosis not present

## 2018-11-30 DIAGNOSIS — Z01810 Encounter for preprocedural cardiovascular examination: Secondary | ICD-10-CM | POA: Diagnosis not present

## 2018-11-30 DIAGNOSIS — N179 Acute kidney failure, unspecified: Secondary | ICD-10-CM | POA: Diagnosis not present

## 2018-11-30 DIAGNOSIS — R079 Chest pain, unspecified: Secondary | ICD-10-CM | POA: Diagnosis present

## 2018-11-30 DIAGNOSIS — R918 Other nonspecific abnormal finding of lung field: Secondary | ICD-10-CM | POA: Diagnosis not present

## 2018-11-30 DIAGNOSIS — E108 Type 1 diabetes mellitus with unspecified complications: Secondary | ICD-10-CM | POA: Diagnosis not present

## 2018-11-30 DIAGNOSIS — E1159 Type 2 diabetes mellitus with other circulatory complications: Secondary | ICD-10-CM | POA: Diagnosis not present

## 2018-11-30 HISTORY — PX: LEFT HEART CATH AND CORONARY ANGIOGRAPHY: CATH118249

## 2018-11-30 LAB — BASIC METABOLIC PANEL
Anion gap: 9 (ref 5–15)
BUN/Creatinine Ratio: 13 (ref 10–24)
BUN: 42 mg/dL — ABNORMAL HIGH (ref 8–27)
BUN: 34 mg/dL — ABNORMAL HIGH (ref 8–23)
CO2: 17 mmol/L — ABNORMAL LOW (ref 20–29)
CO2: 18 mmol/L — ABNORMAL LOW (ref 22–32)
Calcium: 9.5 mg/dL (ref 8.6–10.2)
Calcium: 8.5 mg/dL — ABNORMAL LOW (ref 8.9–10.3)
Chloride: 111 mmol/L — ABNORMAL HIGH (ref 96–106)
Chloride: 114 mmol/L — ABNORMAL HIGH (ref 98–111)
Creatinine, Ser: 3.29 mg/dL — ABNORMAL HIGH (ref 0.76–1.27)
Creatinine, Ser: 2.76 mg/dL — ABNORMAL HIGH (ref 0.61–1.24)
GFR calc Af Amer: 20 mL/min/{1.73_m2} — ABNORMAL LOW (ref >59)
GFR calc Af Amer: 24 mL/min — ABNORMAL LOW (ref >60)
GFR calc non Af Amer: 17 mL/min/{1.73_m2} — ABNORMAL LOW (ref >59)
GFR calc non Af Amer: 21 mL/min — ABNORMAL LOW (ref >60)
Glucose, Bld: 135 mg/dL — ABNORMAL HIGH (ref 70–99)
Glucose: 240 mg/dL — ABNORMAL HIGH (ref 65–99)
Potassium: 5.4 mmol/L — ABNORMAL HIGH (ref 3.5–5.2)
Potassium: 4.5 mmol/L (ref 3.5–5.1)
Sodium: 144 mmol/L (ref 134–144)
Sodium: 141 mmol/L (ref 135–145)

## 2018-11-30 LAB — CBC
Hematocrit: 44.2 % (ref 37.5–51.0)
Hemoglobin: 14.5 g/dL (ref 13.0–17.7)
MCH: 31.2 pg (ref 26.6–33.0)
MCHC: 32.8 g/dL (ref 31.5–35.7)
MCV: 95 fL (ref 79–97)
Platelets: 176 10*3/uL (ref 150–450)
RBC: 4.65 x10E6/uL (ref 4.14–5.80)
RDW: 14.8 % (ref 11.6–15.4)
WBC: 9.8 10*3/uL (ref 3.4–10.8)

## 2018-11-30 LAB — GLUCOSE, CAPILLARY
Glucose-Capillary: 145 mg/dL — ABNORMAL HIGH (ref 70–99)
Glucose-Capillary: 109 mg/dL — ABNORMAL HIGH (ref 70–99)
Glucose-Capillary: 218 mg/dL — ABNORMAL HIGH (ref 70–99)

## 2018-11-30 SURGERY — LEFT HEART CATH AND CORONARY ANGIOGRAPHY
Anesthesia: LOCAL

## 2018-11-30 MED ORDER — ALLOPURINOL 100 MG PO TABS
100.0000 mg | ORAL_TABLET | Freq: Two times a day (BID) | ORAL | Status: DC
  Administered 2018-11-30 – 2018-12-11 (×21): 100 mg via ORAL
  Filled 2018-11-30 (×21): qty 1

## 2018-11-30 MED ORDER — HEPARIN (PORCINE) IN NACL 1000-0.9 UT/500ML-% IV SOLN
INTRAVENOUS | Status: AC
  Filled 2018-11-30: qty 1000

## 2018-11-30 MED ORDER — SODIUM CHLORIDE 0.9 % IV SOLN
INTRAVENOUS | Status: AC
  Administered 2018-11-30: 75 mL/h via INTRAVENOUS

## 2018-11-30 MED ORDER — ISOSORBIDE MONONITRATE ER 30 MG PO TB24
30.0000 mg | ORAL_TABLET | Freq: Every day | ORAL | Status: DC
  Administered 2018-12-01 – 2018-12-04 (×4): 30 mg via ORAL
  Filled 2018-11-30 (×6): qty 1

## 2018-11-30 MED ORDER — INSULIN ASPART PROT & ASPART (70-30 MIX) 100 UNIT/ML PEN: 35.0000 [IU] | PEN_INJECTOR | Freq: Two times a day (BID) | SUBCUTANEOUS | Status: DC

## 2018-11-30 MED ORDER — SODIUM CHLORIDE 0.9 % IV SOLN: 250.0000 mL | INTRAVENOUS | Status: DC | PRN

## 2018-11-30 MED ORDER — HEPARIN SODIUM (PORCINE) 1000 UNIT/ML IJ SOLN
INTRAMUSCULAR | Status: DC | PRN
  Administered 2018-11-30: 5000 [IU] via INTRAVENOUS

## 2018-11-30 MED ORDER — INSULIN ASPART PROT & ASPART (70-30 MIX) 100 UNIT/ML ~~LOC~~ SUSP
35.0000 [IU] | Freq: Two times a day (BID) | SUBCUTANEOUS | Status: DC
  Administered 2018-11-30 – 2018-12-02 (×5): 35 [IU] via SUBCUTANEOUS
  Filled 2018-11-30: qty 10

## 2018-11-30 MED ORDER — HEPARIN SODIUM (PORCINE) 1000 UNIT/ML IJ SOLN
INTRAMUSCULAR | Status: AC
  Filled 2018-11-30: qty 1

## 2018-11-30 MED ORDER — METOPROLOL SUCCINATE ER 100 MG PO TB24
100.0000 mg | ORAL_TABLET | Freq: Every day | ORAL | Status: DC
  Administered 2018-12-01 – 2018-12-04 (×4): 100 mg via ORAL
  Filled 2018-11-30 (×4): qty 1

## 2018-11-30 MED ORDER — AMLODIPINE BESYLATE 2.5 MG PO TABS
2.5000 mg | ORAL_TABLET | Freq: Every day | ORAL | Status: DC
  Administered 2018-11-30 – 2018-12-04 (×5): 2.5 mg via ORAL
  Filled 2018-11-30 (×5): qty 1

## 2018-11-30 MED ORDER — SODIUM CHLORIDE 0.9% FLUSH: 3.0000 mL | INTRAVENOUS | Status: DC | PRN

## 2018-11-30 MED ORDER — ONDANSETRON HCL 4 MG/2ML IJ SOLN: 4.0000 mg | Freq: Four times a day (QID) | INTRAMUSCULAR | Status: DC | PRN

## 2018-11-30 MED ORDER — INFLUENZA VAC A&B SA ADJ QUAD 0.5 ML IM PRSY: 0.5000 mL | PREFILLED_SYRINGE | INTRAMUSCULAR | Status: DC

## 2018-11-30 MED ORDER — LABETALOL HCL 5 MG/ML IV SOLN: 10.0000 mg | INTRAVENOUS | Status: AC | PRN

## 2018-11-30 MED ORDER — HEPARIN (PORCINE) IN NACL 1000-0.9 UT/500ML-% IV SOLN
INTRAVENOUS | Status: DC | PRN
  Administered 2018-11-30 (×3): 500 mL

## 2018-11-30 MED ORDER — VERAPAMIL HCL 2.5 MG/ML IV SOLN
INTRAVENOUS | Status: DC | PRN
  Administered 2018-11-30: 10 mL via INTRA_ARTERIAL

## 2018-11-30 MED ORDER — FENTANYL CITRATE (PF) 100 MCG/2ML IJ SOLN
INTRAMUSCULAR | Status: AC
  Filled 2018-11-30: qty 2

## 2018-11-30 MED ORDER — MIDAZOLAM HCL 2 MG/2ML IJ SOLN
INTRAMUSCULAR | Status: AC
  Filled 2018-11-30: qty 2

## 2018-11-30 MED ORDER — LIDOCAINE HCL (PF) 1 % IJ SOLN
INTRAMUSCULAR | Status: AC
  Filled 2018-11-30: qty 30

## 2018-11-30 MED ORDER — PNEUMOCOCCAL VAC POLYVALENT 25 MCG/0.5ML IJ INJ: 0.5000 mL | INJECTION | INTRAMUSCULAR | Status: DC

## 2018-11-30 MED ORDER — DILTIAZEM HCL 60 MG PO TABS: 60.0000 mg | ORAL_TABLET | Freq: Three times a day (TID) | ORAL | Status: DC | PRN

## 2018-11-30 MED ORDER — SIMVASTATIN 20 MG PO TABS
10.0000 mg | ORAL_TABLET | Freq: Every day | ORAL | Status: DC
  Administered 2018-12-01 – 2018-12-05 (×5): 10 mg via ORAL
  Filled 2018-11-30 (×5): qty 1

## 2018-11-30 MED ORDER — HYDRALAZINE HCL 20 MG/ML IJ SOLN: 10.0000 mg | INTRAMUSCULAR | Status: AC | PRN

## 2018-11-30 MED ORDER — MIDAZOLAM HCL 2 MG/2ML IJ SOLN
INTRAMUSCULAR | Status: DC | PRN
  Administered 2018-11-30: 1 mg via INTRAVENOUS

## 2018-11-30 MED ORDER — ACETAMINOPHEN 325 MG PO TABS
650.0000 mg | ORAL_TABLET | ORAL | Status: DC | PRN
  Administered 2018-12-01: 650 mg via ORAL
  Filled 2018-11-30: qty 2

## 2018-11-30 MED ORDER — FENTANYL CITRATE (PF) 100 MCG/2ML IJ SOLN
INTRAMUSCULAR | Status: DC | PRN
  Administered 2018-11-30: 25 ug via INTRAVENOUS

## 2018-11-30 MED ORDER — HYDRALAZINE HCL 25 MG PO TABS
25.0000 mg | ORAL_TABLET | Freq: Three times a day (TID) | ORAL | Status: DC
  Administered 2018-11-30 – 2018-12-04 (×14): 25 mg via ORAL
  Filled 2018-11-30 (×15): qty 1

## 2018-11-30 MED ORDER — FUROSEMIDE 40 MG PO TABS
40.0000 mg | ORAL_TABLET | Freq: Every day | ORAL | Status: DC
  Administered 2018-12-01 – 2018-12-04 (×4): 40 mg via ORAL
  Filled 2018-11-30 (×4): qty 1

## 2018-11-30 MED ORDER — IOHEXOL 350 MG/ML SOLN
INTRAVENOUS | Status: DC | PRN
  Administered 2018-11-30: 65 mL via INTRA_ARTERIAL

## 2018-11-30 MED ORDER — NITROGLYCERIN 0.4 MG SL SUBL: 0.4000 mg | SUBLINGUAL_TABLET | SUBLINGUAL | Status: DC | PRN

## 2018-11-30 MED ORDER — SODIUM CHLORIDE 0.9 % WEIGHT BASED INFUSION
3.0000 mL/kg/h | INTRAVENOUS | Status: DC
  Administered 2018-11-30: 3 mL/kg/h via INTRAVENOUS

## 2018-11-30 MED ORDER — ASPIRIN 81 MG PO CHEW: 81.0000 mg | CHEWABLE_TABLET | ORAL | Status: DC

## 2018-11-30 MED ORDER — AMIODARONE HCL 200 MG PO TABS
200.0000 mg | ORAL_TABLET | Freq: Every day | ORAL | Status: DC
  Administered 2018-12-01 – 2018-12-04 (×4): 200 mg via ORAL
  Filled 2018-11-30 (×4): qty 1

## 2018-11-30 MED ORDER — SODIUM CHLORIDE 0.9 % WEIGHT BASED INFUSION: 1.0000 mL/kg/h | INTRAVENOUS | Status: DC

## 2018-11-30 MED ORDER — SODIUM CHLORIDE 0.9% FLUSH: 3.0000 mL | Freq: Two times a day (BID) | INTRAVENOUS | Status: DC

## 2018-11-30 MED ORDER — LIDOCAINE HCL (PF) 1 % IJ SOLN
INTRAMUSCULAR | Status: DC | PRN
  Administered 2018-11-30: 2 mL

## 2018-11-30 MED ORDER — VERAPAMIL HCL 2.5 MG/ML IV SOLN
INTRAVENOUS | Status: AC
  Filled 2018-11-30: qty 2

## 2018-11-30 MED ORDER — SODIUM CHLORIDE 0.9% FLUSH
3.0000 mL | Freq: Two times a day (BID) | INTRAVENOUS | Status: DC
  Administered 2018-12-01 – 2018-12-04 (×8): 3 mL via INTRAVENOUS

## 2018-11-30 SURGICAL SUPPLY — 10 items
CATH 5FR JL3.5 JR4 ANG PIG MP (CATHETERS) ×2 IMPLANT
DEVICE RAD COMP TR BAND LRG (VASCULAR PRODUCTS) ×2 IMPLANT
GLIDESHEATH SLEND SS 6F .021 (SHEATH) ×2 IMPLANT
GUIDEWIRE INQWIRE 1.5J.035X260 (WIRE) ×1 IMPLANT
INQWIRE 1.5J .035X260CM (WIRE) ×2
KIT HEART LEFT (KITS) ×2 IMPLANT
PACK CARDIAC CATHETERIZATION (CUSTOM PROCEDURE TRAY) ×2 IMPLANT
TRANSDUCER W/STOPCOCK (MISCELLANEOUS) ×2 IMPLANT
TUBING CIL FLEX 10 FLL-RA (TUBING) ×2 IMPLANT
WIRE HI TORQ VERSACORE-J 145CM (WIRE) ×2 IMPLANT

## 2018-11-30 NOTE — Plan of Care (Signed)
Patient had a cath procedure performed today and had severe multivessel disease. Patient is discussing his wife on the eligible cardiac procedures that are presented to him.

## 2018-11-30 NOTE — Progress Notes (Signed)
TCTS consulted for CABG evaluation. °

## 2018-11-30 NOTE — Plan of Care (Signed)
  Problem: Education: Goal: Knowledge of General Education information will improve Description: Including pain rating scale, medication(s)/side effects and non-pharmacologic comfort measures Outcome: Progressing   Problem: Health Behavior/Discharge Planning: Goal: Ability to manage health-related needs will improve Outcome: Progressing   Problem: Clinical Measurements: Goal: Ability to maintain clinical measurements within normal limits will improve Outcome: Progressing Goal: Will remain free from infection Outcome: Progressing Goal: Diagnostic test results will improve Outcome: Progressing Goal: Respiratory complications will improve Outcome: Progressing Goal: Cardiovascular complication will be avoided Outcome: Progressing   Problem: Activity: Goal: Risk for activity intolerance will decrease Outcome: Progressing   Problem: Nutrition: Goal: Adequate nutrition will be maintained Outcome: Progressing   Problem: Coping: Goal: Level of anxiety will decrease Outcome: Progressing   Problem: Elimination: Goal: Will not experience complications related to bowel motility Outcome: Progressing Goal: Will not experience complications related to urinary retention Outcome: Progressing   Problem: Pain Managment: Goal: General experience of comfort will improve Outcome: Progressing   Problem: Safety: Goal: Ability to remain free from injury will improve Outcome: Progressing   Problem: Skin Integrity: Goal: Risk for impaired skin integrity will decrease Outcome: Progressing   Problem: Education: Goal: Knowledge of cardiac device and self-care will improve Outcome: Progressing Goal: Ability to safely manage health related needs after discharge will improve Outcome: Progressing Goal: Individualized Educational Video(s) Outcome: Progressing   Problem: Cardiac: Goal: Ability to achieve and maintain adequate cardiopulmonary perfusion will improve Outcome: Progressing    Problem: Education: Goal: Ability to describe self-care measures that may prevent or decrease complications (Diabetes Survival Skills Education) will improve Outcome: Progressing Goal: Individualized Educational Video(s) Outcome: Progressing   Problem: Coping: Goal: Ability to adjust to condition or change in health will improve Outcome: Progressing   Problem: Fluid Volume: Goal: Ability to maintain a balanced intake and output will improve Outcome: Progressing   Problem: Health Behavior/Discharge Planning: Goal: Ability to identify and utilize available resources and services will improve Outcome: Progressing Goal: Ability to manage health-related needs will improve Outcome: Progressing   Problem: Metabolic: Goal: Ability to maintain appropriate glucose levels will improve Outcome: Progressing   Problem: Nutritional: Goal: Maintenance of adequate nutrition will improve Outcome: Progressing Goal: Progress toward achieving an optimal weight will improve Outcome: Progressing   Problem: Skin Integrity: Goal: Risk for impaired skin integrity will decrease Outcome: Progressing   Problem: Tissue Perfusion: Goal: Adequacy of tissue perfusion will improve Outcome: Progressing

## 2018-11-30 NOTE — Consult Note (Addendum)
[address]       [place] [address]             [phone]        Daneil Fogg [place] Medical Record #[id number] Date of Birth: Apr 30, 1940  Referring: Dr. Angelena Form Primary Care: [name], MD Primary Cardiologist:Philip Nahser, MD  Chief Complaint:  CAD  History of Present Illness:      Mr. Lipford is a 78 yo obese white male with known history of CKD, DM on insulin, Sick Sinus Syndrome post pacemaker with history of PAF on Eliquis, and CAD with multiple PCI in the past, which he states was over 20 years ago.  The patient presented to the ED on 11/24/2018 with complaints of chest pain.  However, he left prior to being seen due to prolonged wait time in the ED.  The patient was evaluated by Dr. [name] on 11/28/2018 with complaints of chest pain that is located mid sternally.  It radiates to his throat.   The pain would occur randomly and was worse with movement.  It also occurs at night and woke patient from sleep.  He also gets chest pain walking to the mail box.  Pain would resolve with SL NTG.  It was felt the patient should undergo cardiac catheterization.  He was agreeable to proceed.  This was performed today and showed in-stent stenosis of previously placed stents.  There was also severe stenosis in native coronary arteries.  It was felt being diabetic coronary bypass grafting could be indicated, however if felt not to be a candidate, multivessel PCI could be attempted.  Currently patient is chest pain free.  He admits to long standing diabetes which is fairly well controlled.  He doesn't know his most recent A1c as it hasn't been checked recently due to [place].  He states his chest pain episodes are sporadic and are not linked to anything specifically.  He admits to not being very active.  He really doesn't want to have bypass surgery performed.  He states he has bad kidneys and would really not want to require dialysis.  He states he feels better with  having stents placed over coronary bypass grafting.          Current Activity/ Functional Status: Patient is independent with mobility/ambulation, transfers, ADL's, IADL's.   Zubrod Score: At the time of surgery this patients most appropriate activity status/level should be described as: []     0    Normal activity, no symptoms []     1    Restricted in physical strenuous activity but ambulatory, able to do out light work [x]     2    Ambulatory and capable of self care, unable to do work activities, up and about                 more than 50%  Of the time                            []     3    Only limited self care, in bed greater than 50% of waking hours []     4    Completely disabled, no self care, confined to bed or chair []     5    Moribund  Past Medical History:  Diagnosis Date   CAD (coronary artery disease)    s/p stent placement   CKD (chronic kidney disease), stage III  Colon polyps    Diabetes mellitus without complication ([place])    Gout    HLD (hyperlipidemia)    HTN (hypertension)    Insomnia    MI (myocardial infarction) ([place]) 1998   NSTEMI (non-ST elevated myocardial infarction) ([place])    s/p PCI in early 2000s   OSA on CPAP    SSS (sick sinus syndrome) ([facility])    Type II or unspecified type diabetes mellitus without mention of complication, not stated as uncontrolled     Past Surgical History:  Procedure Laterality Date   APPENDECTOMY     CATARACT EXTRACTION     CORONARY ANGIOPLASTY WITH STENT PLACEMENT     x4   HERNIA REPAIR     ventral hernia repair with mesh--revision also was required   INSERT / REPLACE / REMOVE PACEMAKER  05/10/2000   pacemaker implanted in [place]  09/03/09   MDT Adapta generator change in [place]  2/2   polyp that could not be removed on colonoscopy    Social History   Tobacco Use  Smoking Status Never Smoker  Smokeless Tobacco Never Used    Social History     Substance and Sexual Activity  Alcohol Use No     Allergies  Allergen Reactions   Shellfish Allergy Rash    Current Facility-Administered Medications  Medication Dose Route Frequency Provider Last Rate Last Dose   0.9 %  sodium chloride infusion  250 mL Intravenous PRN Nahser, [name], MD       0.9 %  sodium chloride infusion   Intravenous Continuous Burnell Blanks, MD 75 mL/hr at 11/30/18 1338 75 mL/hr at 11/30/18 1338   0.9% sodium chloride infusion  1 mL/kg/hr Intravenous Continuous Nahser, [name], MD 109.7 mL/hr at 11/30/18 0937 1 mL/kg/hr at 11/30/18 1610   aspirin chewable tablet 81 mg  81 mg Oral Pre-Cath Nahser, [name], MD       sodium chloride flush (NS) 0.9 % injection 3 mL  3 mL Intravenous Q12H Nahser, [name], MD       sodium chloride flush (NS) 0.9 % injection 3 mL  3 mL Intravenous PRN Nahser, [name], MD        Medications Prior to Admission  Medication Sig Dispense Refill Last Dose   allopurinol (ZYLOPRIM) 100 MG tablet Take 100 mg by mouth 2 (two) times daily.   11/29/2018 at Unknown time   amiodarone (PACERONE) 200 MG tablet Take 1 tablet (200 mg total) by mouth daily. 90 tablet 3 11/30/2018 at 0600   amLODipine (NORVASC) 2.5 MG tablet Take 2.5 mg by mouth at bedtime.    11/30/2018 at 0600   apixaban (ELIQUIS) 5 MG TABS tablet Take 5 mg by mouth 2 (two) times daily.   11/28/2018 at 0800   Cholecalciferol (VITAMIN D-3) 25 MCG (1000 UT) CAPS Take 1,000 Units by mouth 2 (two) times daily.    11/30/2018 at 0600   diltiazem (CARDIZEM) 60 MG tablet Take 1 tablet (60 mg total) by mouth 3 (three) times daily with meals as needed (palpitations). 30 tablet 6 11/30/2018 at 0600   diphenhydramine-acetaminophen (TYLENOL PM EXTRA STRENGTH) 25-500 MG TABS tablet Take 0.5 tablets by mouth at bedtime.    11/29/2018 at Unknown time   furosemide (LASIX) 40 MG tablet Take 40 mg by mouth daily.   11/29/2018 at Unknown time   Homeopathic Products  [facility] DRY EYE RELIEF OP) Place 1 drop into both  eyes daily as needed (dry eyes).   11/29/2018 at Unknown time   hydrALAZINE (APRESOLINE) 25 MG tablet TAKE 1 TABLET BY MOUTH THREE TIMES A DAY (Patient taking differently: Take 25 mg by mouth 3 (three) times daily. ) 270 tablet 02 11/30/2018 at 0600   isosorbide mononitrate (IMDUR) 30 MG 24 hr tablet Take 30 mg by mouth daily.   11/30/2018 at 0600   metoprolol succinate (TOPROL-XL) 100 MG 24 hr tablet Take 100 mg by mouth daily. Take with or immediately following a meal.   11/30/2018 at 0600   nitroGLYCERIN (NITROSTAT) 0.4 MG SL tablet Place 1 tablet (0.4 mg total) under the tongue every 5 (five) minutes as needed for chest pain. 25 tablet 6 Past Week at Unknown time   NOVOLOG MIX 70/30 FLEXPEN (70-30) 100 UNIT/ML FlexPen Inject 35 Units into the skin 2 (two) times daily with a meal.   3 11/29/2018 at Unknown time   Phenylephrine-Acetaminophen (TYLENOL SINUS+HEADACHE PO) Take 1 tablet by mouth daily as needed (sinus headaches).   Past Month at Unknown time   simvastatin (ZOCOR) 10 MG tablet Take 10 mg by mouth at bedtime.    11/30/2018 at 0600    Family History  Problem Relation Age of Onset   Heart attack Father 70   Heart disease Father    Breast cancer Sister    Pulmonary embolism Sister    Deep vein thrombosis Sister    Review of Systems:   ROS    Cardiac Review of Systems: Y or  [    ]= no  Chest Pain [ Y   ]  Resting SOB [ N  ] Exertional SOB  [N  ]  Orthopnea [  ]   Pedal Edema [   ]    Palpitations [Y  ] Syncope  [  ]   Presyncope [   ]  General Review of Systems: [Y] = yes [  ]=no Constitional: recent weight change [  ]; anorexia [  ]; fatigue [  ]; nausea [ N ]; night sweats [  ]; fever [N  ]; or chills [  ]                                                               Dental: Last Dentist visit:   Eye : blurred vision [  ]; diplopia [   ]; vision changes [  ];  Amaurosis fugax[  ]; Resp: cough [  ];  wheezing[   ];  hemoptysis[  ]; shortness of breath[  ]; paroxysmal nocturnal dyspnea[  ]; dyspnea on exertion[  ]; or orthopnea[  ];  GI:  gallstones[  ], vomiting[ N ];  dysphagia[  ]; melena[  ];  hematochezia [  ]; heartburn[  ];   Hx of  Colonoscopy[  ]; GU: kidney stones [  ]; hematuria[  ];   dysuria [  ];  nocturia[  ];  history of     obstruction [  ]; urinary frequency [  ]             Skin: rash, swelling[N  ];, hair loss[  ];  peripheral edema[  ];  or itching[  ]; Musculosketetal: myalgias[  ];  joint swelling[  ];  joint erythema[  ];  joint pain[  ];  back pain[  ];  Heme/Lymph: bruising[  ];  bleeding[  ];  anemia[  ];  Neuro: TIA[  ];  headaches[  ];  stroke[  ];  vertigo[  ];  seizures[  ];   paresthesias[  ];  difficulty walking[  ];  Psych:depression[  ]; anxiety[  ];             Endocrine: diabetes[Y  ];  thyroid dysfunction[  ];    Physical Exam: BP (!) 181/78    Pulse 61    Temp (!) 97.3 F (36.3 C) (Skin)    Resp 11    Ht 5' 11.5" (1.816 m)    Wt 108.9 kg    SpO2 98%    BMI 33.01 kg/m   General appearance: alert, cooperative and no distress Head: Normocephalic, without obvious abnormality, atraumatic Neck: no adenopathy, no carotid bruit, no JVD, supple, symmetrical, trachea midline and thyroid not enlarged, symmetric, no tenderness/mass/nodules Resp: clear to auscultation bilaterally Cardio: regular rate and rhythm GI: soft, non-tender; bowel sounds normal; no masses,  no organomegaly Extremities: extremities normal, atraumatic, no cyanosis or edema Neurologic: Grossly normal  Diagnostic Studies & Laboratory data:     Recent Radiology Findings:   No results found.   I have independently reviewed the above radiologic studies and discussed with the patient   Recent Lab Findings: Lab Results  Component Value Date   WBC 9.8 11/28/2018   HGB 14.5 11/28/2018   HCT 44.2 11/28/2018   PLT 176 11/28/2018   GLUCOSE 135 (H) 11/30/2018   CHOL 105 03/11/2017   TRIG 96  03/11/2017   HDL 38 (L) 03/11/2017   LDLDIRECT 50.5 10/10/2013   LDLCALC 48 03/11/2017   ALT 46 (H) 04/01/2016   AST 31 04/01/2016   NA 141 11/30/2018   K 4.5 11/30/2018   CL 114 (H) 11/30/2018   CREATININE 2.76 (H) 11/30/2018   BUN 34 (H) 11/30/2018   CO2 18 (L) 11/30/2018   TSH 4.470 04/01/2016   HGBA1C 7.8 (H) 03/11/2017    Assessment / Plan:    1. CAD- originally developed 20 years ago, PCI done previously with current new in-stent stenosis... patient doesn't really want coronary bypass grafting, he would prefer to have stents placed 2. CKD- creatinine has been over 3 in the past.. he would be at increased risk of renal failure post operatively, requiring dialysis 3. DM- patient states he is well controlled, A1c hasn't been checked since 2019, elevated at 7.8 at that time, on insulin at home 4. Dispo- patient is chest pain free, progression of previous CAD... patient would like stents if possible, CKD would make patient at increased risk of dialysis, Dr. Orvan Seen will speak with patient to ensure he doesn't wish to proceed with CABG procedure  I  spent 55 minutes counseling the patient face to face.   Erin Barrett, PA-C  11/30/2018 3:49 PM  Pt seen and examined last evening. Very pleasant gentleman with long h/o CAD, s/p PCI some 20+ years ago, DM and CRI. He notes consistently decreased energy level, increased fatigue with exertion, and more recently classic substernal CP for which he sought evaluation. LHC yesterday shows severe, multivessel CAD that is clearly best suited for CABG for complete revascularization, especially in a diabetic. No recent echo, but last EF 60% (2019). While his initial preference was towards repeat PCI, he is in agreement to undergo standard work-up for CABG which includes carotid duplex, CT chest, PFTs, and peripheral  ultrasonography. It might be reasonable to consult nephrology preoperatively. I will be in close communication with Mr. Rutigliano as the  results are made available. I think he would do very well with CABG assuming there is adequate conduit, although the risk for renal failure requiring dialysis is elevated for CABG or for additional exposure by PCI. Thank you for allowing [place] to participate in this patient's care.   Daviana Haymaker Z. Orvan Seen, [place] TCTS [phone]

## 2018-11-30 NOTE — Progress Notes (Signed)
Patient's heart rate is 66 on the monitor.

## 2018-11-30 NOTE — Interval H&P Note (Signed)
History and Physical Interval Note:  11/30/2018 10:08 AM  Bradley Soto  has presented today for surgery, with the diagnosis of cad - unstable angina.  The various methods of treatment have been discussed with the patient and family. After consideration of risks, benefits and other options for treatment, the patient has consented to  Procedure(s): LEFT HEART CATH AND CORONARY ANGIOGRAPHY (N/A) as a surgical intervention.  The patient's history has been reviewed, patient examined, no change in status, stable for surgery.  I have reviewed the patient's chart and labs.  Questions were answered to the patient's satisfaction.    Cath Lab Visit (complete for each Cath Lab visit)  Clinical Evaluation Leading to the Procedure:   ACS: No.  Non-ACS:    Anginal Classification: CCS III  Anti-ischemic medical therapy: Maximal Therapy (2 or more classes of medications)  Non-Invasive Test Results: No non-invasive testing performed  Prior CABG: No previous CABG         Lauree Chandler

## 2018-12-01 ENCOUNTER — Inpatient Hospital Stay (HOSPITAL_COMMUNITY): Payer: Medicare Other

## 2018-12-01 ENCOUNTER — Encounter (HOSPITAL_COMMUNITY): Payer: Self-pay | Admitting: Cardiovascular Disease

## 2018-12-01 DIAGNOSIS — I200 Unstable angina: Secondary | ICD-10-CM

## 2018-12-01 DIAGNOSIS — N1832 Chronic kidney disease, stage 3b: Secondary | ICD-10-CM

## 2018-12-01 DIAGNOSIS — E1159 Type 2 diabetes mellitus with other circulatory complications: Secondary | ICD-10-CM

## 2018-12-01 DIAGNOSIS — I2510 Atherosclerotic heart disease of native coronary artery without angina pectoris: Secondary | ICD-10-CM

## 2018-12-01 DIAGNOSIS — E782 Mixed hyperlipidemia: Secondary | ICD-10-CM

## 2018-12-01 DIAGNOSIS — Z01810 Encounter for preprocedural cardiovascular examination: Secondary | ICD-10-CM

## 2018-12-01 LAB — PULMONARY FUNCTION TEST
DL/VA % pred: 100 %
DL/VA: 3.88 ml/min/mmHg/L
DLCO cor % pred: 86 %
DLCO cor: 22.32 ml/min/mmHg
DLCO unc % pred: 80 %
DLCO unc: 20.65 ml/min/mmHg
FEF 25-75 Post: 3.76 L/sec
FEF 25-75 Pre: 1.8 L/sec
FEF2575-%Change-Post: 108 %
FEF2575-%Pred-Post: 173 %
FEF2575-%Pred-Pre: 83 %
FEV1-%Change-Post: 23 %
FEV1-%Pred-Post: 101 %
FEV1-%Pred-Pre: 81 %
FEV1-Post: 3.13 L
FEV1-Pre: 2.53 L
FEV1FVC-%Change-Post: 5 %
FEV1FVC-%Pred-Pre: 102 %
FEV6-%Change-Post: 19 %
FEV6-%Pred-Post: 99 %
FEV6-%Pred-Pre: 83 %
FEV6-Post: 4.03 L
FEV6-Pre: 3.38 L
FEV6FVC-%Change-Post: 0 %
FEV6FVC-%Pred-Post: 105 %
FEV6FVC-%Pred-Pre: 105 %
FVC-%Change-Post: 17 %
FVC-%Pred-Post: 94 %
FVC-%Pred-Pre: 79 %
FVC-Post: 4.07 L
FVC-Pre: 3.46 L
Post FEV1/FVC ratio: 77 %
Post FEV6/FVC ratio: 99 %
Pre FEV1/FVC ratio: 73 %
Pre FEV6/FVC Ratio: 99 %
RV % pred: 130 %
RV: 3.55 L
TLC % pred: 98 %
TLC: 7.23 L

## 2018-12-01 LAB — CBC
HCT: 37.1 % — ABNORMAL LOW (ref 39.0–52.0)
Hemoglobin: 12.2 g/dL — ABNORMAL LOW (ref 13.0–17.0)
MCH: 31.2 pg (ref 26.0–34.0)
MCHC: 32.9 g/dL (ref 30.0–36.0)
MCV: 94.9 fL (ref 80.0–100.0)
Platelets: 127 10*3/uL — ABNORMAL LOW (ref 150–400)
RBC: 3.91 MIL/uL — ABNORMAL LOW (ref 4.22–5.81)
RDW: 14.8 % (ref 11.5–15.5)
WBC: 7.9 10*3/uL (ref 4.0–10.5)
nRBC: 0 % (ref 0.0–0.2)

## 2018-12-01 LAB — BASIC METABOLIC PANEL
Anion gap: 6 (ref 5–15)
BUN: 33 mg/dL — ABNORMAL HIGH (ref 8–23)
CO2: 18 mmol/L — ABNORMAL LOW (ref 22–32)
Calcium: 8.3 mg/dL — ABNORMAL LOW (ref 8.9–10.3)
Chloride: 118 mmol/L — ABNORMAL HIGH (ref 98–111)
Creatinine, Ser: 2.72 mg/dL — ABNORMAL HIGH (ref 0.61–1.24)
GFR calc Af Amer: 25 mL/min — ABNORMAL LOW (ref >60)
GFR calc non Af Amer: 21 mL/min — ABNORMAL LOW (ref >60)
Glucose, Bld: 78 mg/dL (ref 70–99)
Potassium: 4.2 mmol/L (ref 3.5–5.1)
Sodium: 142 mmol/L (ref 135–145)

## 2018-12-01 LAB — ECHOCARDIOGRAM COMPLETE
Height: 71.5 in
Weight: 3819.2 oz

## 2018-12-01 LAB — HEMOGLOBIN A1C
Hgb A1c MFr Bld: 8.3 % — ABNORMAL HIGH (ref 4.8–5.6)
Mean Plasma Glucose: 191.51 mg/dL

## 2018-12-01 MED ORDER — ALBUTEROL SULFATE (2.5 MG/3ML) 0.083% IN NEBU
2.5000 mg | INHALATION_SOLUTION | Freq: Once | RESPIRATORY_TRACT | Status: AC
  Administered 2018-12-01: 2.5 mg via RESPIRATORY_TRACT

## 2018-12-01 NOTE — Progress Notes (Signed)
  Echocardiogram 2D Echocardiogram has been performed.  Bradley Soto 12/01/2018, 9:08 AM

## 2018-12-01 NOTE — Plan of Care (Signed)
  Problem: Education: Goal: Knowledge of General Education information will improve Description: Including pain rating scale, medication(s)/side effects and non-pharmacologic comfort measures Outcome: Progressing   Problem: Safety: Goal: Ability to remain free from injury will improve Outcome: Progressing   Problem: Tissue Perfusion: Goal: Adequacy of tissue perfusion will improve Outcome: Progressing

## 2018-12-01 NOTE — Progress Notes (Signed)
Chaplain went to follow-up on Advanced Directive Referral.  Bradley Soto stated that he did not want to do an Advanced Directive right now, but would follow-up with his wife later when she arrived.   Chaplain will follow-up as needed.

## 2018-12-01 NOTE — Progress Notes (Addendum)
Progress Note  Patient Name: Bradley Soto Date of Encounter: 12/01/2018  Primary Cardiologist: Mertie Moores, MD   Subjective   Seen by cardiothoracic surgery. Possible plan for CABG. Patient denies recurrent chest pain or shortness of breath.   Inpatient Medications    Scheduled Meds: . allopurinol  100 mg Oral BID  . amiodarone  200 mg Oral Daily  . amLODipine  2.5 mg Oral QHS  . furosemide  40 mg Oral Daily  . hydrALAZINE  25 mg Oral TID  . insulin aspart protamine- aspart  35 Units Subcutaneous BID WC  . isosorbide mononitrate  30 mg Oral Daily  . metoprolol succinate  100 mg Oral Daily  . simvastatin  10 mg Oral QHS  . sodium chloride flush  3 mL Intravenous Q12H   Continuous Infusions: . sodium chloride     PRN Meds: sodium chloride, acetaminophen, diltiazem, nitroGLYCERIN, ondansetron (ZOFRAN) IV, sodium chloride flush   Vital Signs    Vitals:   12/01/18 0021 12/01/18 0100 12/01/18 0252 12/01/18 0814  BP: (!) 163/63  132/74 138/68  Pulse: 74  (!) 58 63  Resp: 18  18 19   Temp: 99.2 F (37.3 C)  98.4 F (36.9 C) 97.8 F (36.6 C)  TempSrc: Oral  Oral Oral  SpO2: 98%  97% 96%  Weight:  108.3 kg    Height:        Intake/Output Summary (Last 24 hours) at 12/01/2018 0940 Last data filed at 12/01/2018 0900 Gross per 24 hour  Intake 2878.25 ml  Output 780 ml  Net 2098.25 ml   Last 3 Weights 12/01/2018 11/30/2018 11/28/2018  Weight (lbs) 238 lb 11.2 oz 240 lb 241 lb 12.8 oz  Weight (kg) 108.274 kg 108.863 kg 109.68 kg      Telemetry    Atrial paced rhythm, HR in the 60s - Personally Reviewed  ECG    No new - Personally Reviewed  Physical Exam   GEN: No acute distress.   Neck: No JVD Cardiac: RRR, no murmurs, rubs, or gallops.  Respiratory: Clear to auscultation bilaterally. GI: Soft, nontender, non-distended  MS: No edema; No deformity. Neuro:  Nonfocal  Psych: Normal affect   Labs    High Sensitivity Troponin:   Recent Labs  Lab  11/24/18 1605  TROPONINIHS 7      Chemistry Recent Labs  Lab 11/24/18 1605 11/28/18 1448 11/30/18 1127 12/01/18 0510  NA 138 144 141 142  K 4.8 5.4* 4.5 4.2  CL 106 111* 114* 118*  CO2 22 17* 18* 18*  GLUCOSE 298* 240* 135* 78  BUN 44* 42* 34* 33*  CREATININE 3.46* 3.29* 2.76* 2.72*  CALCIUM 9.1 9.5 8.5* 8.3*  GFRNONAA 16* 17* 21* 21*  GFRAA 18* 20* 24* 25*  ANIONGAP 10  --  9 6     Hematology Recent Labs  Lab 11/24/18 1605 11/28/18 1448 12/01/18 0510  WBC 8.2 9.8 7.9  RBC 4.17* 4.65 3.91*  HGB 13.2 14.5 12.2*  HCT 39.4 44.2 37.1*  MCV 94.5 95 94.9  MCH 31.7 31.2 31.2  MCHC 33.5 32.8 32.9  RDW 15.3 14.8 14.8  PLT 157 176 127*    BNPNo results for input(s): BNP, PROBNP in the last 168 hours.   DDimer No results for input(s): DDIMER in the last 168 hours.   Radiology    No results found.  Cardiac Studies   Left Heart Cath 11/30/18  Ramus lesion is 99% stenosed.  Prox LAD to Mid LAD lesion is  70% stenosed.  Dist Cx lesion is 60% stenosed.  Mid RCA lesion is 95% stenosed.  3rd Mrg-1 lesion is 80% stenosed.  3rd Mrg-2 lesion is 99% stenosed.  1st LPL lesion is 99% stenosed.   1. Severe multi-vessel CAD 2. Moderately severe mid LAD stenosis 3. Severe restenosis of the stented segment in the proximal segment of the ramus intermediate branch 4. Severe restenosis mid Circumflex/obtuse marginal stented segment. Severe stenosis in the first obtuse marginal branch beyond the stented segment. Severe stenosis distal Circumflex leading into the moderate caliber second obtuse marginal branch.  5. Severe stenosis mid RCA  Recommendations: Pt is a diabetic with mult-vessel CAD. Will ask CT surgery to see him to discuss potential bypass surgery. If he is not felt to be a candidate for bypass, would consider multi-vessel PCI. Admit to telemetry. Will hydrate tonight. BMET in am.       Echo pending   Patient Profile     78 y.o. male with pmh of CKD,  DM on insulin, Sick sinus syndrome s/p pacemaker, PAF on Eliquis, CAD with multiple PCIs (over 20 years ago) who was evaluated for chest pain who underwent cath showing in-stent restenosis evaluated by CTTS.  Assessment & Plan    Chest pain/ CAD s/p multiple PCI (over 20 years ago) with in-stent restenosis on recent cath.  - Cath yesterday showed severe multivessel CAD and severe restenosis - Seen by CTTS who with tentative plan for CABG  - Echo results pending. Echo from 2019 shoed EF 60-65% - Patient is chest pain free - Continue Imdur, Toprol, statin - Will decide to proceed with CABG after pre-procedural tests and a discussion with CTTs. Will follow along - Recommend lifestyle changes. Will check lipids and A1C  Paroxysmal AFib - Amiodarone 200mg  daily home med - A/C Eliquis >> held for cath - continue heparin - Atrial paced rhythm  Chronic diastolic HF - No signs or symptoms of decompensation at this time - Continue home dose lasix 40 mg daily  CKD stage 2-3 - Patient has h/o of elevated creatinine to 3 - Creatinine today 2.72  HTN - Norvasc 2.5 mg, Hydralazine 25 mg TID, Imdur 30 mg daily, Toprol 100 mg daily - BP today 138/68 - Continue current regimen  Hyperlipidemia - simvastatin home med - Will check lipid panel - Will likely need high intensity statin  Sick sinus syndrome s/p pacemaker - appears to be mostly atrial paced - Last interrogation showed afib burden 1%  DM2 - A1C 1 year ago was 7.8>> will recheck   For questions or updates, please contact [facility] Please consult [email or web address] for contact info under        Signed, Cadence Ninfa Meeker, PA-C  12/01/2018, 9:40 AM    I have examined the patient and reviewed assessment and plan and discussed with patient.  Agree with above as stated.    Right radial site stable.  I spoke at length with the patient and the patient's wife regarding the decision to consider bypass surgery versus PCI attempt.   Given multivessel disease, in-stent restenosis, renal insufficiency, I think he would get a better long-term result with bypass surgery.  I think he would be agreeable to having surgery.  Dr. Orvan Seen to decide on timing of surgery.  Larae Grooms

## 2018-12-02 LAB — GLUCOSE, CAPILLARY
Glucose-Capillary: 91 mg/dL (ref 70–99)
Glucose-Capillary: 98 mg/dL (ref 70–99)
Glucose-Capillary: 89 mg/dL (ref 70–99)
Glucose-Capillary: 111 mg/dL — ABNORMAL HIGH (ref 70–99)

## 2018-12-02 LAB — LIPID PANEL
Cholesterol: 85 mg/dL (ref 0–200)
HDL: 25 mg/dL — ABNORMAL LOW (ref >40)
LDL Cholesterol: 31 mg/dL (ref 0–99)
Total CHOL/HDL Ratio: 3.4 RATIO
Triglycerides: 145 mg/dL (ref <150)
VLDL: 29 mg/dL (ref 0–40)

## 2018-12-02 LAB — BASIC METABOLIC PANEL
Anion gap: 10 (ref 5–15)
BUN: 40 mg/dL — ABNORMAL HIGH (ref 8–23)
CO2: 18 mmol/L — ABNORMAL LOW (ref 22–32)
Calcium: 8.5 mg/dL — ABNORMAL LOW (ref 8.9–10.3)
Chloride: 115 mmol/L — ABNORMAL HIGH (ref 98–111)
Creatinine, Ser: 2.98 mg/dL — ABNORMAL HIGH (ref 0.61–1.24)
GFR calc Af Amer: 22 mL/min — ABNORMAL LOW (ref >60)
GFR calc non Af Amer: 19 mL/min — ABNORMAL LOW (ref >60)
Glucose, Bld: 74 mg/dL (ref 70–99)
Potassium: 4.3 mmol/L (ref 3.5–5.1)
Sodium: 143 mmol/L (ref 135–145)

## 2018-12-02 LAB — HEPARIN LEVEL (UNFRACTIONATED): Heparin Unfractionated: 0.3 IU/mL (ref 0.30–0.70)

## 2018-12-02 MED ORDER — HEPARIN (PORCINE) 25000 UT/250ML-% IV SOLN
1300.0000 [IU]/h | INTRAVENOUS | Status: DC
  Administered 2018-12-02: 1200 [IU]/h via INTRAVENOUS
  Administered 2018-12-03 – 2018-12-04 (×3): 1300 [IU]/h via INTRAVENOUS
  Filled 2018-12-02 (×4): qty 250

## 2018-12-02 NOTE — Progress Notes (Signed)
CARDIAC REHAB PHASE I   Brief preop ed completed with pt. Pt given IS ad able to demonstrate 2250 with ease. Pt given in-the-tube sheet, Cardiac Surgery booklet, and [facility] care guide. Pt not interested in materials and feels like he doesn't want to know any more about upcoming surgery. Pt expresses concerns for his kidneys and making it through surgery. Support and encouragement provided to pt. Will continue to follow pt throughout his hospital stay.  [phone] Rufina Falco, RN BSN 12/02/2018 11:29 AM

## 2018-12-02 NOTE — Progress Notes (Signed)
Subjective:  Mild episode of pain last night Objective:  Vital Signs in the last 24 hours: Temp:  [97.5 F (36.4 C)-99.2 F (37.3 C)] 99.2 F (37.3 C) (10/16 0748) Pulse Rate:  [60-99] 61 (10/16 0831) Resp:  [1-20] 18 (10/16 0336) BP: (118-178)/(54-80) 138/71 (10/16 0831) SpO2:  [94 %-100 %] 100 % (10/16 0748) Weight:  [108.9 kg] 108.9 kg (10/16 0254)  Intake/Output from previous day: 10/15 0701 - 10/16 0700 In: 1360 [P.O.:1360] Out: -  Intake/Output from this shift: Total I/O In: 200 [P.O.:200] Out: -   Physical Exam: General appearance: alert, cooperative and no distress Lungs: clear to auscultation bilaterally Heart: irregularly irregular rhythm Extremities: extremities normal, atraumatic, no cyanosis or edema Neurologic: Grossly normal  Lab Results: Recent Labs    12/01/18 0510  WBC 7.9  HGB 12.2*  PLT 127*   Recent Labs    12/01/18 0510 12/02/18 0333  NA 142 143  K 4.2 4.3  CL 118* 115*  CO2 18* 18*  GLUCOSE 78 74  BUN 33* 40*  CREATININE 2.72* 2.98*   No results for input(s): TROPONINI in the last 72 hours.  Invalid input(s): CK, MB Hepatic Function Panel No results for input(s): PROT, ALBUMIN, AST, ALT, ALKPHOS, BILITOT, BILIDIR, IBILI in the last 72 hours. Recent Labs    12/02/18 0333  CHOL 85   No results for input(s): PROTIME in the last 72 hours.  Imaging: Imaging results have been reviewed  Cardiac Studies:  Assessment/Plan:  Coronary Artery Disease s/p PCI 2 decades ago. I have reviewed his pre-operative studies with him. Using the STS On-line Risk calculator, the risk for 30 day mortality is estimated at 2.4%; risk for renal failure is 9.2% and prolonged ventilation is 9.4%. Overall risk for morbidity or mortality is 24%. He wishes to proceed with surgery, which we will plan for Monday 12/05/18.   LOS: 2 days    Wonda Olds  TCTS 12/02/2018, 10:55 AM

## 2018-12-02 NOTE — Progress Notes (Signed)
ANTICOAGULATION CONSULT NOTE - Initial Consult  Pharmacy Consult for heparin Indication: chest pain/ACS  Allergies  Allergen Reactions  . Shellfish Allergy Rash    Patient Measurements: Height: 5' 11.5" (181.6 cm) Weight: 240 lb (108.9 kg) IBW/kg (Calculated) : 76.45 Heparin Dosing Weight:100kg  Vital Signs: Temp: 99.2 F (37.3 C) (10/16 0748) Temp Source: Oral (10/16 0748) BP: 138/71 (10/16 0831) Pulse Rate: 61 (10/16 0831)  Labs: Recent Labs    11/30/18 1127 12/01/18 0510  HGB  --  12.2*  HCT  --  37.1*  PLT  --  127*  CREATININE 2.76* 2.72*    Estimated Creatinine Clearance: 27.9 mL/min (A) (by C-G formula based on SCr of 2.72 mg/dL (H)).   Medical History: Past Medical History:  Diagnosis Date  . CAD (coronary artery disease)    s/p stent placement  . CKD (chronic kidney disease), stage III   . Colon polyps   . Diabetes mellitus without complication ([place])   . Gout   . HLD (hyperlipidemia)   . HTN (hypertension)   . Insomnia   . MI (myocardial infarction) ([place]) 1998  . NSTEMI (non-ST elevated myocardial infarction) ([place])    s/p PCI in early 2000s  . OSA on CPAP   . SSS (sick sinus syndrome) ([place])   . Type II or unspecified type diabetes mellitus without mention of complication, not stated as uncontrolled     Medications:  Medications Prior to Admission  Medication Sig Dispense Refill Last Dose  . allopurinol (ZYLOPRIM) 100 MG tablet Take 100 mg by mouth 2 (two) times daily.   11/29/2018 at Unknown time  . amiodarone (PACERONE) 200 MG tablet Take 1 tablet (200 mg total) by mouth daily. 90 tablet 3 11/30/2018 at 0600  . amLODipine (NORVASC) 2.5 MG tablet Take 2.5 mg by mouth at bedtime.    11/30/2018 at 0600  . apixaban (ELIQUIS) 5 MG TABS tablet Take 5 mg by mouth 2 (two) times daily.   11/28/2018 at 0800  . Cholecalciferol (VITAMIN D-3) 25 MCG (1000 UT) CAPS Take 1,000 Units by mouth 2 (two) times daily.    11/30/2018 at 0600  . diltiazem (CARDIZEM)  60 MG tablet Take 1 tablet (60 mg total) by mouth 3 (three) times daily with meals as needed (palpitations). 30 tablet 6 11/30/2018 at 0600  . diphenhydramine-acetaminophen (TYLENOL PM EXTRA STRENGTH) 25-500 MG TABS tablet Take 0.5 tablets by mouth at bedtime.    11/29/2018 at Unknown time  . furosemide (LASIX) 40 MG tablet Take 40 mg by mouth daily.   11/29/2018 at Unknown time  . Homeopathic Products (SIMILASAN DRY EYE RELIEF OP) Place 1 drop into both eyes daily as needed (dry eyes).   11/29/2018 at Unknown time  . hydrALAZINE (APRESOLINE) 25 MG tablet TAKE 1 TABLET BY MOUTH THREE TIMES A DAY (Patient taking differently: Take 25 mg by mouth 3 (three) times daily. ) 270 tablet 02 11/30/2018 at 0600  . isosorbide mononitrate (IMDUR) 30 MG 24 hr tablet Take 30 mg by mouth daily.   11/30/2018 at 0600  . metoprolol succinate (TOPROL-XL) 100 MG 24 hr tablet Take 100 mg by mouth daily. Take with or immediately following a meal.   11/30/2018 at 0600  . nitroGLYCERIN (NITROSTAT) 0.4 MG SL tablet Place 1 tablet (0.4 mg total) under the tongue every 5 (five) minutes as needed for chest pain. 25 tablet 6 Past Week at Unknown time  . NOVOLOG MIX 70/30 FLEXPEN (70-30) 100 UNIT/ML FlexPen Inject 35 Units into the  skin 2 (two) times daily with a meal.   3 11/29/2018 at Unknown time  . Phenylephrine-Acetaminophen (TYLENOL SINUS+HEADACHE PO) Take 1 tablet by mouth daily as needed (sinus headaches).   Past Month at Unknown time  . simvastatin (ZOCOR) 10 MG tablet Take 10 mg by mouth at bedtime.    11/30/2018 at 0600   Scheduled:  . allopurinol  100 mg Oral BID  . amiodarone  200 mg Oral Daily  . amLODipine  2.5 mg Oral QHS  . furosemide  40 mg Oral Daily  . hydrALAZINE  25 mg Oral TID  . insulin aspart protamine- aspart  35 Units Subcutaneous BID WC  . isosorbide mononitrate  30 mg Oral Daily  . metoprolol succinate  100 mg Oral Daily  . simvastatin  10 mg Oral QHS  . sodium chloride flush  3 mL Intravenous  Q12H    Assessment: 78 yo male s/p cath 10/14 with multivessel CAD and plans for CABG on Monday. He is also noted with history of afib (CHADSVASC=6) on apixaban PTA (last dose taken 10/12).  Pharmacy dose heparin  -hg= 12.2, plt= 127  Goal of Therapy:  Heparin level 0.3-0.7 units/ml Monitor platelets by anticoagulation protocol: Yes   Plan:  -no heparin bolus -Begin heparin at 1200 units/hr -Heparin level in 8 hours and daily wth CBC daily  Hildred Laser, PharmD Clinical Pharmacist **Pharmacist phone directory can now be found on [email or web address] ([email or web address]).  Listed under [place].

## 2018-12-02 NOTE — Consult Note (Signed)
Reason for Consult: CKD stage 4 Referring Physician: Olanrewaju Soto is an 78 y.o. male.  HPI: Bradley Soto has a PMH significant for DM type 2, HTN, CKD stage 4 (followed by Dr. Hollie Salk with baseline Cr 2.7-3.3), SSS s/p PPM placement, h/o paroxysmal atrial fibrillation on eliquis, HLD, and CAD who underwent cardiac cath on 11/30/18 for unstable angina which revealed severe, multi-vessel CAD as well as in-stent stenosis of his ramus.  CT surgery was consulted for possible CABG as treatment option.  We were consulted due to his advanced CKD and possible need for dialysis after surgery.   Trend in Creatinine: Creatinine, Ser  Date/Time Value Ref Range Status  12/02/2018 03:33 AM 2.98 (H) 0.61 - 1.24 mg/dL Final  12/01/2018 05:10 AM 2.72 (H) 0.61 - 1.24 mg/dL Final  11/30/2018 11:27 AM 2.76 (H) 0.61 - 1.24 mg/dL Final  11/28/2018 02:48 PM 3.29 (H) 0.76 - 1.27 mg/dL Final  11/24/2018 04:05 PM 3.46 (H) 0.61 - 1.24 mg/dL Final  03/12/2017 07:54 AM 3.35 (H) 0.61 - 1.24 mg/dL Final  03/10/2017 08:44 PM 2.85 (H) 0.61 - 1.24 mg/dL Final  04/03/2016 09:58 AM 3.20 (H) 0.76 - 1.27 mg/dL Final  04/01/2016 01:03 PM 3.38 (H) 0.76 - 1.27 mg/dL Final  10/16/2013 11:24 AM 2.2 (H) 0.4 - 1.5 mg/dL Final  10/10/2013 10:17 AM 2.6 (H) 0.4 - 1.5 mg/dL Final    PMH:   Past Medical History:  Diagnosis Date  . CAD (coronary artery disease)    s/p stent placement  . CKD (chronic kidney disease), stage III   . Colon polyps   . Diabetes mellitus without complication ([place])   . Gout   . HLD (hyperlipidemia)   . HTN (hypertension)   . Insomnia   . MI (myocardial infarction) ([place]) 1998  . NSTEMI (non-ST elevated myocardial infarction) ([place])    s/p PCI in early 2000s  . OSA on CPAP   . SSS (sick sinus syndrome) ([place])   . Type II or unspecified type diabetes mellitus without mention of complication, not stated as uncontrolled     PSH:   Past Surgical History:  Procedure Laterality Date  . APPENDECTOMY     . CATARACT EXTRACTION    . CORONARY ANGIOPLASTY WITH STENT PLACEMENT     x4  . HERNIA REPAIR     ventral hernia repair with mesh--revision also was required  . INSERT / REPLACE / REMOVE PACEMAKER  05/10/2000   pacemaker implanted in [place]  . LEFT HEART CATH AND CORONARY ANGIOGRAPHY N/A 11/30/2018   Procedure: LEFT HEART CATH AND CORONARY ANGIOGRAPHY;  Surgeon: Burnell Blanks, MD;  Location: [facility];  Service: Cardiovascular;  Laterality: N/A;  . PACEMAKER GENERATOR CHANGE  09/03/09   MDT Adapta generator change in [place]  . PARTIAL COLON REMOVAL  2/2   polyp that could not be removed on colonoscopy    Allergies:  Allergies  Allergen Reactions  . Shellfish Allergy Rash    Medications:   Prior to Admission medications   Medication Sig Start Date End Date Taking? Authorizing Provider  allopurinol (ZYLOPRIM) 100 MG tablet Take 100 mg by mouth 2 (two) times daily.   Yes [provider]  amiodarone (PACERONE) 200 MG tablet Take 1 tablet (200 mg total) by mouth daily. 04/07/18  Yes Allred, Jeneen Rinks, MD  amLODipine (NORVASC) 2.5 MG tablet Take 2.5 mg by mouth at bedtime.  11/16/17  Yes [provider]  apixaban (ELIQUIS) 5 MG TABS tablet Take 5  mg by mouth 2 (two) times daily.   Yes [provider]  Cholecalciferol (VITAMIN D-3) 25 MCG (1000 UT) CAPS Take 1,000 Units by mouth 2 (two) times daily.    Yes [provider]  diltiazem (CARDIZEM) 60 MG tablet Take 1 tablet (60 mg total) by mouth 3 (three) times daily with meals as needed (palpitations). 03/16/18  Yes Nahser, [name], MD  diphenhydramine-acetaminophen (TYLENOL PM EXTRA STRENGTH) 25-500 MG TABS tablet Take 0.5 tablets by mouth at bedtime.    Yes [provider]  furosemide (LASIX) 40 MG tablet Take 40 mg by mouth daily. 09/13/18  Yes [provider]  Homeopathic Products [facility] DRY EYE RELIEF OP) Place 1 drop into both eyes daily as needed (dry eyes).   Yes  [provider]  hydrALAZINE (APRESOLINE) 25 MG tablet TAKE 1 TABLET BY MOUTH THREE TIMES A DAY Patient taking differently: Take 25 mg by mouth 3 (three) times daily.  06/06/18  Yes Nahser, [name], MD  isosorbide mononitrate (IMDUR) 30 MG 24 hr tablet Take 30 mg by mouth daily.   Yes [provider]  metoprolol succinate (TOPROL-XL) 100 MG 24 hr tablet Take 100 mg by mouth daily. Take with or immediately following a meal.   Yes [provider]  nitroGLYCERIN (NITROSTAT) 0.4 MG SL tablet Place 1 tablet (0.4 mg total) under the tongue every 5 (five) minutes as needed for chest pain. 11/25/18  Yes Nahser, [name], MD  NOVOLOG MIX 70/30 FLEXPEN (70-30) 100 UNIT/ML FlexPen Inject 35 Units into the skin 2 (two) times daily with a meal.  02/26/16  Yes [provider]  Phenylephrine-Acetaminophen (TYLENOL SINUS+HEADACHE PO) Take 1 tablet by mouth daily as needed (sinus headaches).   Yes [provider]  simvastatin (ZOCOR) 10 MG tablet Take 10 mg by mouth at bedtime.  10/15/17  Yes [provider]    Inpatient medications: . allopurinol  100 mg Oral BID  . amiodarone  200 mg Oral Daily  . amLODipine  2.5 mg Oral QHS  . furosemide  40 mg Oral Daily  . hydrALAZINE  25 mg Oral TID  . insulin aspart protamine- aspart  35 Units Subcutaneous BID WC  . isosorbide mononitrate  30 mg Oral Daily  . metoprolol succinate  100 mg Oral Daily  . simvastatin  10 mg Oral QHS  . sodium chloride flush  3 mL Intravenous Q12H    Discontinued Meds:   Medications Discontinued During This Encounter  Medication Reason  . iohexol (OMNIPAQUE) 350 MG/ML injection Patient Discharge  . heparin injection Patient Discharge  . Radial Cocktail/Verapamil only Patient Discharge  . lidocaine (PF) (XYLOCAINE) 1 % injection Patient Discharge  . Heparin (Porcine) in NaCl 1000-0.9 UT/500ML-% SOLN Patient Discharge  . midazolam (VERSED) injection Patient Discharge  . fentaNYL  (SUBLIMAZE) injection Patient Discharge  . 0.9 %  sodium chloride infusion Patient Transfer  . 0.9% sodium chloride infusion Patient Transfer  . sodium chloride flush (NS) 0.9 % injection 3 mL Patient Transfer  . sodium chloride flush (NS) 0.9 % injection 3 mL Patient Transfer  . aspirin chewable tablet 81 mg Patient Transfer  . 0.9% sodium chloride infusion   . insulin aspart protamine - aspart (NOVOLOG 70/30 MIX) FlexPen 35 Units   . pneumococcal 23 valent vaccine (PNU-IMMUNE) injection 0.5 mL   . influenza vaccine adjuvanted (FLUAD) injection 0.5 mL     Social History:  reports that he has never smoked. He has never used smokeless tobacco.  He reports that he does not drink alcohol or use drugs.  Family History:   Family History  Problem Relation Age of Onset  . Heart attack Father 29  . Heart disease Father   . Breast cancer Sister   . Pulmonary embolism Sister   . Deep vein thrombosis Sister     Pertinent items are noted in HPI. Weight change: 0 kg  Intake/Output Summary (Last 24 hours) at 12/02/2018 1302 Last data filed at 12/02/2018 0800 Gross per 24 hour  Intake 1120 ml  Output -  Net 1120 ml   BP (!) 119/53 (BP Location: Right Arm)   Pulse 60   Temp 97.7 F (36.5 C) (Oral)   Resp 20   Ht 5' 11.5" (1.816 m)   Wt 108.9 kg   SpO2 96%   BMI 33.01 kg/m  Vitals:   12/02/18 0336 12/02/18 0748 12/02/18 0831 12/02/18 1110  BP: (!) 143/68 122/80 138/71 (!) 119/53  Pulse: 60 99 61 60  Resp: 18   20  Temp: 97.7 F (36.5 C) 99.2 F (37.3 C)  97.7 F (36.5 C)  TempSrc: Oral Oral  Oral  SpO2: 96% 100%  96%  Weight:      Height:         General appearance: alert, cooperative and no distress Head: Normocephalic, without obvious abnormality, atraumatic Resp: clear to auscultation bilaterally Cardio: regular rate and rhythm, S1, S2 normal, no murmur, click, rub or gallop GI: soft, non-tender; bowel sounds normal; no masses,  no organomegaly Extremities: extremities  normal, atraumatic, no cyanosis or edema  Labs: Basic Metabolic Panel: Recent Labs  Lab 11/28/18 1448 11/30/18 1127 12/01/18 0510 12/02/18 0333  NA 144 141 142 143  K 5.4* 4.5 4.2 4.3  CL 111* 114* 118* 115*  CO2 17* 18* 18* 18*  GLUCOSE 240* 135* 78 74  BUN 42* 34* 33* 40*  CREATININE 3.29* 2.76* 2.72* 2.98*  CALCIUM 9.5 8.5* 8.3* 8.5*   Liver Function Tests: No results for input(s): AST, ALT, ALKPHOS, BILITOT, PROT, ALBUMIN in the last 168 hours. No results for input(s): LIPASE, AMYLASE in the last 168 hours. No results for input(s): AMMONIA in the last 168 hours. CBC: Recent Labs  Lab 11/28/18 1448 12/01/18 0510  WBC 9.8 7.9  HGB 14.5 12.2*  HCT 44.2 37.1*  MCV 95 94.9  PLT 176 127*   PT/INR: @LABRCNTIP (inr:5) Cardiac Enzymes: )No results for input(s): CKTOTAL, CKMB, CKMBINDEX, TROPONINI in the last 168 hours. CBG: Recent Labs  Lab 11/30/18 0814 11/30/18 1346 11/30/18 1811 12/02/18 0546 12/02/18 1107  GLUCAP 145* 109* 218* 91 98    Iron Studies: No results for input(s): IRON, TIBC, TRANSFERRIN, FERRITIN in the last 168 hours.  Xrays/Other Studies: Ct Chest Wo Contrast  Result Date: 12/01/2018 CLINICAL DATA:  Pt needs CABG done, and is in process of deciding; no prior CT chest has been done. Elevated renal function CAD eval, new onset HF, Asx for ischemia, LVEF reduced, no prior CAD pre-op CABG please perform ASAP EXAM: CT CHEST WITHOUT CONTRAST TECHNIQUE: Multidetector CT imaging of the chest was performed following the standard protocol without IV contrast. COMPARISON:  Two-view chest on 11/24/2018 FINDINGS: Cardiovascular: LEFT-sided transvenous pacemaker leads to the RIGHT atrium and RIGHT ventricle. Heart size is normal. No pericardial effusion. There is dense atherosclerosis of LEFT and RIGHT coronary arteries. There is atherosclerotic calcification of the thoracic aorta not associated with aneurysm. Normal noncontrast appearance of the pulmonary  arteries. Mediastinum/Nodes: Nodule within the LEFT lobe  of the thyroid gland measures 2.1 centimeters. The esophagus has diffusely thickened wall throughout its course, consistent with esophagitis. No hiatal hernia or evidence for esophageal mass. No significant mediastinal adenopathy. Lungs/Pleura: There is dependent septal thickening consistent with mild edema. A 0.9 x 0.8 centimeter nodules identified in the anterior LEFT UPPER lobe, best seen on image 105 of series 4. A 3 millimeter nodule is identified in the LEFT UPPER lobe on image 50/4. A ground-glass opacity is identified in the LEFT lung apex measuring 1.3 x 1.9 centimeters. Small ground-glass opacity at the MEDIAL LEFT lung apex is 0.9 x 0.7 centimeters on image 33/4. Scarring and mild pleural thickening at the LEFT lung base. Scattered emphysematous changes. Upper Abdomen: No acute abnormality. Musculoskeletal: No chest wall mass or suspicious bone lesions identified. IMPRESSION: 1. Three-vessel coronary artery disease. 2. Mild cardiomegaly. 3. Diffuse thickening of the wall of the esophagus, consistent with esophagitis. 4. Pulmonary nodules. Largest ground-glass opacity in the LEFT lung apex is 1.3 x 1.9 centimeters and warrants follow-up. Recommend follow-up CT of the chest in 3 months. 5. Left thyroid nodule measuring 2.1 cm. Consider further evaluation with thyroid ultrasound. If patient is clinically hyperthyroid, consider nuclear medicine thyroid uptake and scan. 6. Aortic Atherosclerosis (ICD10-I70.0) and Emphysema (ICD10-J43.9). Electronically Signed   By: Nolon Nations M.D.   On: 12/01/2018 09:41   Vas US Doppler Pre Cabg  Result Date: 12/01/2018 PREOPERATIVE VASCULAR EVALUATION  Indications: Pre Op CABG. Performing Technologist: Lita Mains RVT  Examination Guidelines: A complete evaluation includes B-mode imaging, spectral Doppler, color Doppler, and power Doppler as needed of all accessible portions of each vessel. Bilateral  testing is considered an integral part of a complete examination. Limited examinations for reoccurring indications may be performed as noted.  Right Carotid Findings: +----------+--------+--------+--------+--------------------------+-------------+           PSV cm/sEDV cm/sStenosisDescribe                  Comments      +----------+--------+--------+--------+--------------------------+-------------+ CCA Prox  89      11                                                      +----------+--------+--------+--------+--------------------------+-------------+ CCA Distal78      14              diffuse and hyperechoic                 +----------+--------+--------+--------+--------------------------+-------------+ ICA Prox  194     38      40-59%  heterogenous, irregular   40-59% by PSV                                   and smooth                              +----------+--------+--------+--------+--------------------------+-------------+ ICA Distal95      28                                                      +----------+--------+--------+--------+--------------------------+-------------+  ECA       213     17      >50%                                            +----------+--------+--------+--------+--------------------------+-------------+ Portions of this table do not appear on this page. +----------+--------+-------+----------------+------------+           PSV cm/sEDV cmsDescribe        Arm Pressure +----------+--------+-------+----------------+------------+ Subclavian116            Multiphasic, [id number]          +----------+--------+-------+----------------+------------+ +---------+--------+--+--------+--+---------+ VertebralPSV cm/s68EDV cm/s15Antegrade +---------+--------+--+--------+--+---------+ Left Carotid Findings: +----------+--------+--------+--------+-------------------------------+--------+           PSV cm/sEDV cm/sStenosisDescribe                        Comments +----------+--------+--------+--------+-------------------------------+--------+ CCA Prox  105     17                                                      +----------+--------+--------+--------+-------------------------------+--------+ CCA Distal111     14                                                      +----------+--------+--------+--------+-------------------------------+--------+ ICA Prox  136     32              heterogenous, irregular,                                                  diffuse and calcific                    +----------+--------+--------+--------+-------------------------------+--------+ ICA Mid   129     35                                                      +----------+--------+--------+--------+-------------------------------+--------+ ICA Distal118     36                                                      +----------+--------+--------+--------+-------------------------------+--------+ ECA       243     17      >50%    hyperechoic, heterogenous and                                             irregular                               +----------+--------+--------+--------+-------------------------------+--------+ +----------+--------+--------+----------------+------------+  SubclavianPSV cm/sEDV cm/sDescribe        Arm Pressure +----------+--------+--------+----------------+------------+           188             Multiphasic, [id number]          +----------+--------+--------+----------------+------------+ +---------+--------+--+--------+--+---------+ VertebralPSV cm/s66EDV cm/s15Antegrade +---------+--------+--+--------+--+---------+  ABI Findings: +---------+------------------+-----+---------+--------+ Right    Rt Pressure (mmHg)IndexWaveform Comment  +---------+------------------+-----+---------+--------+ Brachial 137                    triphasic          +---------+------------------+-----+---------+--------+ PTA      255               1.83 biphasic          +---------+------------------+-----+---------+--------+ DP       255               1.83 biphasic          +---------+------------------+-----+---------+--------+ Great Toe111               0.80 Normal            +---------+------------------+-----+---------+--------+ +---------+------------------+-----+---------+-------+ Left     Lt Pressure (mmHg)IndexWaveform Comment +---------+------------------+-----+---------+-------+ Brachial 139                    triphasic        +---------+------------------+-----+---------+-------+ PTA      255               1.83 triphasic        +---------+------------------+-----+---------+-------+ DP       204               1.47 triphasic        +---------+------------------+-----+---------+-------+ Donalee Citrin               0.83 Normal           +---------+------------------+-----+---------+-------+  Right Doppler Findings: +--------+--------+-----+---------+--------+ Site    PressureIndexDoppler  Comments +--------+--------+-----+---------+--------+ [id number]          triphasic         +--------+--------+-----+---------+--------+ Radial               triphasic         +--------+--------+-----+---------+--------+ Ulnar                triphasic         +--------+--------+-----+---------+--------+  Left Doppler Findings: +--------+--------+-----+---------+--------+ Site    PressureIndexDoppler  Comments +--------+--------+-----+---------+--------+ [id number]          triphasic         +--------+--------+-----+---------+--------+ Radial               triphasic         +--------+--------+-----+---------+--------+ Ulnar                triphasic         +--------+--------+-----+---------+--------+  Summary: Right Carotid: Velocities in the right ICA are consistent with a 40-59%                 stenosis. The ECA appears >50% stenosed. Left Carotid: Velocities in the left ICA are consistent with a 1-39% stenosis.               The ECA appears >50% stenosed. Vertebrals:  Bilateral vertebral arteries demonstrate antegrade flow. Subclavians: Normal flow hemodynamics were seen in bilateral subclavian              arteries. Right ABI: Resting right  ankle-brachial index indicates noncompressible right lower extremity arteries. ABIs are unreliable. RT great toe pressure = 111 mmHg. PT and DP are noncompressible. Left ABI: Resting left ankle-brachial index indicates noncompressible left lower extremity arteries. ABIs are unreliable. LT Great toe pressure = 115 mmHg. PT is noncompressible. DP is unreliable. Right Upper Extremity: Doppler waveforms remain within normal limits with right radial compression. Doppler waveforms decrease >50% with right ulnar compression. Left Upper Extremity: Doppler waveforms remain within normal limits with left radial compression. Doppler waveforms decrease >50% with left ulnar compression.  Electronically signed by Servando Snare MD on 12/01/2018 at 3:42:52 PM.    Final      Assessment/Plan: 1. CAD- severe, multivessel:  Plan for CABG on 12/05/18 per Dr. Orvan Seen. 2. CKD stage 4/A3- baseline Cr 2.8-3.3.  Per Dr. Orvan Seen, his risk for renal failure is 9.2% using the STS on-line risk calculator, his overall risk for ESRD is 40 % in 2 years and 82% in 5 years using kidney failure risk calculator.  He understands the consequences and is willing to proceed with CABG.  He has not discussed dialysis in the past but admits that he would rather avoid it altogether.  Will continue to follow along during this hospitalization and hopefully he can avoid the need for dialysis post-operatively.  3. Anemia of CKD- stable 4. P A fib- was on amio and eliquis at home.  Will transition to heparin 5. Chronic diastolic CHF- euvolemic 6. HTN- stable 7. HLD- on statin 8. SSS s/p pacemaker 9. DM  type 2- Hgb A1c 8.3% per primary svc   Bradley Soto 12/02/2018, 1:02 PM

## 2018-12-02 NOTE — Care Management Important Message (Signed)
Important Message  Patient Details  Name: Bradley Soto MRN: [id number] Date of Birth: 09-10-1940   Medicare Important Message Given:  Yes     Shelda Altes 12/02/2018, 12:01 PM

## 2018-12-02 NOTE — Progress Notes (Addendum)
Progress Note  Patient Name: Bradley Soto Date of Encounter: 12/02/2018  Primary Cardiologist: Mertie Moores, MD   Subjective   Had some CP overnight, not severe, did not tell nursing staff, it resolved. No SOB  Inpatient Medications    Scheduled Meds:  allopurinol  100 mg Oral BID   amiodarone  200 mg Oral Daily   amLODipine  2.5 mg Oral QHS   furosemide  40 mg Oral Daily   hydrALAZINE  25 mg Oral TID   insulin aspart protamine- aspart  35 Units Subcutaneous BID WC   isosorbide mononitrate  30 mg Oral Daily   metoprolol succinate  100 mg Oral Daily   simvastatin  10 mg Oral QHS   sodium chloride flush  3 mL Intravenous Q12H   Continuous Infusions:  sodium chloride     PRN Meds: sodium chloride, acetaminophen, diltiazem, nitroGLYCERIN, ondansetron (ZOFRAN) IV, sodium chloride flush   Vital Signs    Vitals:   12/02/18 0254 12/02/18 0336 12/02/18 0748 12/02/18 0831  BP:  (!) 143/68 122/80 138/71  Pulse:  60 99 61  Resp:  18    Temp:  97.7 F (36.5 C) 99.2 F (37.3 C)   TempSrc:  Oral Oral   SpO2:  96% 100%   Weight: 108.9 kg     Height:        Intake/Output Summary (Last 24 hours) at 12/02/2018 0856 Last data filed at 12/02/2018 0800 Gross per 24 hour  Intake 1320 ml  Output --  Net 1320 ml   Last 3 Weights 12/02/2018 12/01/2018 11/30/2018  Weight (lbs) 240 lb 238 lb 11.2 oz 240 lb  Weight (kg) 108.863 kg 108.274 kg 108.863 kg      Telemetry    A paced - Personally Reviewed  ECG    No new - Personally Reviewed  Physical Exam   General: Well developed, well nourished, male in no acute distress Head: Eyes PERRLA Normocephalic and atraumatic Lungs: Clear bilaterally to auscultation. Heart: HRRR S1 S2, without rub or gallop. No murmur.  Pulses are 2+ & equal. No JVD. Abdomen: Bowel sounds are present, abdomen soft and non-tender without masses or  hernias noted. Msk: Normal strength and tone for age. Extremities: No clubbing,  cyanosis or edema.    Skin:  No rashes or lesions noted. Neuro: Alert and oriented X 3. Psych:  Good affect, responds appropriately   Labs    High Sensitivity Troponin:   Recent Labs  Lab 11/24/18 1605  TROPONINIHS 7      Chemistry Recent Labs  Lab 11/28/18 1448 11/30/18 1127 12/01/18 0510  NA 144 141 142  K 5.4* 4.5 4.2  CL 111* 114* 118*  CO2 17* 18* 18*  GLUCOSE 240* 135* 78  BUN 42* 34* 33*  CREATININE 3.29* 2.76* 2.72*  CALCIUM 9.5 8.5* 8.3*  GFRNONAA 17* 21* 21*  GFRAA 20* 24* 25*  ANIONGAP  --  9 6     Hematology Recent Labs  Lab 11/28/18 1448 12/01/18 0510  WBC 9.8 7.9  RBC 4.65 3.91*  HGB 14.5 12.2*  HCT 44.2 37.1*  MCV 95 94.9  MCH 31.2 31.2  MCHC 32.8 32.9  RDW 14.8 14.8  PLT 176 127*   Lab Results  Component Value Date   CHOL 85 12/02/2018   HDL 25 (L) 12/02/2018   LDLCALC 31 12/02/2018   LDLDIRECT 50.5 10/10/2013   TRIG 145 12/02/2018   CHOLHDL 3.4 12/02/2018   Lab Results  Component Value Date  HGBA1C 8.3 (H) 12/01/2018   Lab Results  Component Value Date   TSH 4.470 04/01/2016     Radiology    Ct Chest Wo Contrast  Result Date: 12/01/2018 CLINICAL DATA:  Pt needs CABG done, and is in process of deciding; no prior CT chest has been done. Elevated renal function CAD eval, new onset HF, Asx for ischemia, LVEF reduced, no prior CAD pre-op CABG please perform ASAP EXAM: CT CHEST WITHOUT CONTRAST TECHNIQUE: Multidetector CT imaging of the chest was performed following the standard protocol without IV contrast. COMPARISON:  Two-view chest on 11/24/2018 FINDINGS: Cardiovascular: LEFT-sided transvenous pacemaker leads to the RIGHT atrium and RIGHT ventricle. Heart size is normal. No pericardial effusion. There is dense atherosclerosis of LEFT and RIGHT coronary arteries. There is atherosclerotic calcification of the thoracic aorta not associated with aneurysm. Normal noncontrast appearance of the pulmonary arteries. Mediastinum/Nodes:  Nodule within the LEFT lobe of the thyroid gland measures 2.1 centimeters. The esophagus has diffusely thickened wall throughout its course, consistent with esophagitis. No hiatal hernia or evidence for esophageal mass. No significant mediastinal adenopathy. Lungs/Pleura: There is dependent septal thickening consistent with mild edema. A 0.9 x 0.8 centimeter nodules identified in the anterior LEFT UPPER lobe, best seen on image 105 of series 4. A 3 millimeter nodule is identified in the LEFT UPPER lobe on image 50/4. A ground-glass opacity is identified in the LEFT lung apex measuring 1.3 x 1.9 centimeters. Small ground-glass opacity at the MEDIAL LEFT lung apex is 0.9 x 0.7 centimeters on image 33/4. Scarring and mild pleural thickening at the LEFT lung base. Scattered emphysematous changes. Upper Abdomen: No acute abnormality. Musculoskeletal: No chest wall mass or suspicious bone lesions identified. IMPRESSION: 1. Three-vessel coronary artery disease. 2. Mild cardiomegaly. 3. Diffuse thickening of the wall of the esophagus, consistent with esophagitis. 4. Pulmonary nodules. Largest ground-glass opacity in the LEFT lung apex is 1.3 x 1.9 centimeters and warrants follow-up. Recommend follow-up CT of the chest in 3 months. 5. Left thyroid nodule measuring 2.1 cm. Consider further evaluation with thyroid ultrasound. If patient is clinically hyperthyroid, consider nuclear medicine thyroid uptake and scan. 6. Aortic Atherosclerosis (ICD10-I70.0) and Emphysema (ICD10-J43.9). Electronically Signed   By: Nolon Nations M.D.   On: 12/01/2018 09:41   Vas US Doppler Pre Cabg  Result Date: 12/01/2018 PREOPERATIVE VASCULAR EVALUATION  Indications: Pre Op CABG. Performing Technologist: Lita Mains RVT  Examination Guidelines: A complete evaluation includes B-mode imaging, spectral Doppler, color Doppler, and power Doppler as needed of all accessible portions of each vessel. Bilateral testing is considered an integral  part of a complete examination. Limited examinations for reoccurring indications may be performed as noted.  Right Carotid Findings: +----------+--------+--------+--------+--------------------------+-------------+             PSV cm/s EDV cm/s Stenosis Describe                   Comments       +----------+--------+--------+--------+--------------------------+-------------+  CCA Prox   89       11                                                          +----------+--------+--------+--------+--------------------------+-------------+  CCA Distal 78       14  diffuse and hyperechoic                   +----------+--------+--------+--------+--------------------------+-------------+  ICA Prox   194      38       40-59%   heterogenous, irregular    40-59% by PSV                                         and smooth                                +----------+--------+--------+--------+--------------------------+-------------+  ICA Distal 95       28                                                          +----------+--------+--------+--------+--------------------------+-------------+  ECA        213      17       >50%                                               +----------+--------+--------+--------+--------------------------+-------------+ Portions of this table do not appear on this page. +----------+--------+-------+----------------+------------+             PSV cm/s EDV cms Describe         Arm Pressure  +----------+--------+-------+----------------+------------+  Subclavian 116              Multiphasic, WNL 137           +----------+--------+-------+----------------+------------+ +---------+--------+--+--------+--+---------+  Vertebral PSV cm/s 68 EDV cm/s 15 Antegrade  +---------+--------+--+--------+--+---------+ Left Carotid Findings: +----------+--------+--------+--------+-------------------------------+--------+             PSV cm/s EDV cm/s Stenosis Describe                        Comments   +----------+--------+--------+--------+-------------------------------+--------+  CCA Prox   105      17                                                          +----------+--------+--------+--------+-------------------------------+--------+  CCA Distal 111      14                                                          +----------+--------+--------+--------+-------------------------------+--------+  ICA Prox   136      32                heterogenous, irregular,  diffuse and calcific                      +----------+--------+--------+--------+-------------------------------+--------+  ICA Mid    129      35                                                          +----------+--------+--------+--------+-------------------------------+--------+  ICA Distal 118      36                                                          +----------+--------+--------+--------+-------------------------------+--------+  ECA        243      17       >50%     hyperechoic, heterogenous and                                                    irregular                                 +----------+--------+--------+--------+-------------------------------+--------+ +----------+--------+--------+----------------+------------+  Subclavian PSV cm/s EDV cm/s Describe         Arm Pressure  +----------+--------+--------+----------------+------------+             188               Multiphasic, WNL 139           +----------+--------+--------+----------------+------------+ +---------+--------+--+--------+--+---------+  Vertebral PSV cm/s 66 EDV cm/s 15 Antegrade  +---------+--------+--+--------+--+---------+  ABI Findings: +---------+------------------+-----+---------+--------+  Right     Rt Pressure (mmHg) Index Waveform  Comment   +---------+------------------+-----+---------+--------+  Brachial  137                      triphasic            +---------+------------------+-----+---------+--------+  PTA       255                1.83  biphasic            +---------+------------------+-----+---------+--------+  DP        255                1.83  biphasic            +---------+------------------+-----+---------+--------+  Great Toe 111                0.80  Normal              +---------+------------------+-----+---------+--------+ +---------+------------------+-----+---------+-------+  Left      Lt Pressure (mmHg) Index Waveform  Comment  +---------+------------------+-----+---------+-------+  Brachial  139                      triphasic          +---------+------------------+-----+---------+-------+  PTA       255  1.83  triphasic          +---------+------------------+-----+---------+-------+  DP        204                1.47  triphasic          +---------+------------------+-----+---------+-------+  Great Toe 115                0.83  Normal             +---------+------------------+-----+---------+-------+  Right Doppler Findings: +--------+--------+-----+---------+--------+  Site     Pressure Index Doppler   Comments  +--------+--------+-----+---------+--------+  Brachial 137            triphasic           +--------+--------+-----+---------+--------+  Radial                  triphasic           +--------+--------+-----+---------+--------+  Ulnar                   triphasic           +--------+--------+-----+---------+--------+  Left Doppler Findings: +--------+--------+-----+---------+--------+  Site     Pressure Index Doppler   Comments  +--------+--------+-----+---------+--------+  Brachial 139            triphasic           +--------+--------+-----+---------+--------+  Radial                  triphasic           +--------+--------+-----+---------+--------+  Ulnar                   triphasic           +--------+--------+-----+---------+--------+  Summary: Right Carotid: Velocities in the right ICA are consistent with a 40-59%                 stenosis. The ECA appears >50% stenosed. Left Carotid: Velocities in the left ICA are consistent with a 1-39% stenosis.               The ECA appears >50% stenosed. Vertebrals:  Bilateral vertebral arteries demonstrate antegrade flow. Subclavians: Normal flow hemodynamics were seen in bilateral subclavian              arteries. Right ABI: Resting right ankle-brachial index indicates noncompressible right lower extremity arteries. ABIs are unreliable. RT great toe pressure = 111 mmHg. PT and DP are noncompressible. Left ABI: Resting left ankle-brachial index indicates noncompressible left lower extremity arteries. ABIs are unreliable. LT Great toe pressure = 115 mmHg. PT is noncompressible. DP is unreliable. Right Upper Extremity: Doppler waveforms remain within normal limits with right radial compression. Doppler waveforms decrease >50% with right ulnar compression. Left Upper Extremity: Doppler waveforms remain within normal limits with left radial compression. Doppler waveforms decrease >50% with left ulnar compression.  Electronically signed by Servando Snare MD on 12/01/2018 at 3:42:52 PM.    Final     Cardiac Studies   ECHO: 12/01/2018  1. Left ventricular ejection fraction, by visual estimation, is 60 to 65%. The left ventricle has normal function. Normal left ventricular size. Left ventricular septal wall thickness was mildly increased. Mildly increased left ventricular posterior  wall thickness. There is mildly increased left ventricular hypertrophy.  2. Left ventricular diastolic Doppler parameters are consistent with impaired relaxation pattern of LV diastolic filling.  3. Global right ventricle has normal systolic function.The right ventricular size is  normal. No increase in right ventricular wall thickness.  4. Left atrial size was mildly dilated.  5. Right atrial size was normal.  6. Trivial pericardial effusion is present.  7. The mitral valve is normal in structure. No evidence of mitral  valve regurgitation. No evidence of mitral stenosis.  8. The tricuspid valve is normal in structure. Tricuspid valve regurgitation was not visualized by color flow Doppler.  9. The aortic valve is normal in structure. Aortic valve regurgitation was not visualized by color flow Doppler. Structurally normal aortic valve, with no evidence of sclerosis or stenosis. 10. The pulmonic valve was normal in structure. Pulmonic valve regurgitation is not visualized by color flow Doppler. 11. TR signal is inadequate for assessing pulmonary artery systolic pressure. 12. The inferior vena cava is normal in size with <50% respiratory variability, suggesting right atrial pressure of 8 mmHg.   Left Heart Cath 11/30/18  Ramus lesion is 99% stenosed.  Prox LAD to Mid LAD lesion is 70% stenosed.  Dist Cx lesion is 60% stenosed.  Mid RCA lesion is 95% stenosed.  3rd Mrg-1 lesion is 80% stenosed.  3rd Mrg-2 lesion is 99% stenosed.  1st LPL lesion is 99% stenosed.   1. Severe multi-vessel CAD 2. Moderately severe mid LAD stenosis 3. Severe restenosis of the stented segment in the proximal segment of the ramus intermediate branch 4. Severe restenosis mid Circumflex/obtuse marginal stented segment. Severe stenosis in the first obtuse marginal branch beyond the stented segment. Severe stenosis distal Circumflex leading into the moderate caliber second obtuse marginal branch.  5. Severe stenosis mid RCA  Recommendations: Pt is a diabetic with mult-vessel CAD. Will ask CT surgery to see him to discuss potential bypass surgery. If he is not felt to be a candidate for bypass, would consider multi-vessel PCI. Admit to telemetry. Will hydrate tonight. BMET in am.       Echo pending   Patient Profile     78 y.o. male with pmh of CKD, DM on insulin, Sick sinus syndrome s/p pacemaker, PAF on Eliquis, CAD with multiple PCIs (over 20 years ago) who was evaluated for chest pain who underwent cath showing  in-stent restenosis, evaluated by CTTS.  Assessment & Plan    Chest pain/ CAD s/p multiple PCI (over 20 years ago) with in-stent restenosis on recent cath.  - Multivessel CAD on cath this admit - TCTS has seen, preCABG workup proceeding - keep in-hospital till CABG Monday - Echo results above, EF nl - no ischemic sx - continue BB, statin, nitrates - with recurrent symptoms, will restart heparin  Paroxysmal AFib - Amio as at home - Eliquis>>heparin, CHA2DS2-VASc= 6 (age x 2, CHF, DM, HTN, CAD) - A paced  Chronic diastolic HF - no sig change in wt - I/O incomplete - monitor volume status - on Lasix 40 po qd>>home dose  CKD stage 4 - Cr generally >3  - 10/15 Cr 2.72, recheck pending - Dr Arty Baumgartner contacted, he will see.  HTN - on home rx - Norvasc 2.5, hydralazine 25 mg tid, Imdur 30 mg, Toprol XL 100 mg qd  Hyperlipidemia - LDL 31, HDL 25 on Zocor 10 mg qd - MD advise if pt needs higher dose of statin  Sick sinus syndrome s/p pacemaker - functioning well  DM2 - A1c is 8.3, was 7.8 - dietician to see   For questions or updates, please contact [facility] Please consult [email or web address] for contact info under        Signed, Suanne Marker  Barrett, PA-C  12/02/2018, 8:56 AM    I have examined the patient and reviewed assessment and plan and discussed with patient.  Agree with above as stated.    Doing well. He is speaking to surgeon and they have planned surgery for Monday.  Larae Grooms

## 2018-12-02 NOTE — Progress Notes (Signed)
[place] for heparin Indication: chest pain/ACS  Allergies  Allergen Reactions  . Shellfish Allergy Rash    Patient Measurements: Height: 5' 11.5" (181.6 cm) Weight: 240 lb (108.9 kg) IBW/kg (Calculated) : 76.45 Heparin Dosing Weight:100kg  Vital Signs: Temp: 97.7 F (36.5 C) (10/16 1648) Temp Source: Oral (10/16 1648) BP: 146/76 (10/16 1648) Pulse Rate: 60 (10/16 1648)  Labs: Recent Labs    11/30/18 1127 12/01/18 0510 12/02/18 0333 12/02/18 1753  HGB  --  12.2*  --   --   HCT  --  37.1*  --   --   PLT  --  127*  --   --   HEPARINUNFRC  --   --   --  0.30  CREATININE 2.76* 2.72* 2.98*  --     Estimated Creatinine Clearance: 25.4 mL/min (A) (by C-G formula based on SCr of 2.98 mg/dL (H)).    Assessment: 78 yo male s/p cath 10/14 with multivessel CAD and plans for CABG on Monday. He is also noted with history of afib (CHADSVASC=6) on apixaban PTA (last dose taken 10/12).   Heparin level 0.30  Goal of Therapy:  Heparin level 0.3-0.7 units/ml Monitor platelets by anticoagulation protocol: Yes   Plan:  Increase heparin to 1300 units / hr  Follow up AM labs  Thank you Anette Guarneri, PharmD   **Pharmacist phone directory can now be found on [email or web address] ([email or web address]).  Listed under [place].

## 2018-12-02 NOTE — Plan of Care (Signed)

## 2018-12-03 DIAGNOSIS — I10 Essential (primary) hypertension: Secondary | ICD-10-CM

## 2018-12-03 DIAGNOSIS — N184 Chronic kidney disease, stage 4 (severe): Secondary | ICD-10-CM

## 2018-12-03 DIAGNOSIS — E108 Type 1 diabetes mellitus with unspecified complications: Secondary | ICD-10-CM

## 2018-12-03 DIAGNOSIS — E162 Hypoglycemia, unspecified: Secondary | ICD-10-CM

## 2018-12-03 DIAGNOSIS — I480 Paroxysmal atrial fibrillation: Secondary | ICD-10-CM

## 2018-12-03 DIAGNOSIS — I25118 Atherosclerotic heart disease of native coronary artery with other forms of angina pectoris: Secondary | ICD-10-CM

## 2018-12-03 LAB — RENAL FUNCTION PANEL
Albumin: 3.1 g/dL — ABNORMAL LOW (ref 3.5–5.0)
Anion gap: 7 (ref 5–15)
BUN: 46 mg/dL — ABNORMAL HIGH (ref 8–23)
CO2: 19 mmol/L — ABNORMAL LOW (ref 22–32)
Calcium: 8.4 mg/dL — ABNORMAL LOW (ref 8.9–10.3)
Chloride: 114 mmol/L — ABNORMAL HIGH (ref 98–111)
Creatinine, Ser: 3.18 mg/dL — ABNORMAL HIGH (ref 0.61–1.24)
GFR calc Af Amer: 20 mL/min — ABNORMAL LOW (ref >60)
GFR calc non Af Amer: 18 mL/min — ABNORMAL LOW (ref >60)
Glucose, Bld: 65 mg/dL — ABNORMAL LOW (ref 70–99)
Phosphorus: 4.6 mg/dL (ref 2.5–4.6)
Potassium: 4.6 mmol/L (ref 3.5–5.1)
Sodium: 140 mmol/L (ref 135–145)

## 2018-12-03 LAB — GLUCOSE, CAPILLARY
Glucose-Capillary: 93 mg/dL (ref 70–99)
Glucose-Capillary: 197 mg/dL — ABNORMAL HIGH (ref 70–99)
Glucose-Capillary: 202 mg/dL — ABNORMAL HIGH (ref 70–99)
Glucose-Capillary: 159 mg/dL — ABNORMAL HIGH (ref 70–99)

## 2018-12-03 LAB — CBC
HCT: 37.3 % — ABNORMAL LOW (ref 39.0–52.0)
Hemoglobin: 12.1 g/dL — ABNORMAL LOW (ref 13.0–17.0)
MCH: 31 pg (ref 26.0–34.0)
MCHC: 32.4 g/dL (ref 30.0–36.0)
MCV: 95.6 fL (ref 80.0–100.0)
Platelets: 130 10*3/uL — ABNORMAL LOW (ref 150–400)
RBC: 3.9 MIL/uL — ABNORMAL LOW (ref 4.22–5.81)
RDW: 14.8 % (ref 11.5–15.5)
WBC: 8.2 10*3/uL (ref 4.0–10.5)
nRBC: 0 % (ref 0.0–0.2)

## 2018-12-03 LAB — HEPARIN LEVEL (UNFRACTIONATED): Heparin Unfractionated: 0.47 IU/mL (ref 0.30–0.70)

## 2018-12-03 MED ORDER — TRANEXAMIC ACID 1000 MG/10ML IV SOLN
1.5000 mg/kg/h | INTRAVENOUS | Status: AC
  Administered 2018-12-05: 1.5 mg/kg/h via INTRAVENOUS
  Filled 2018-12-03: qty 25

## 2018-12-03 MED ORDER — NITROGLYCERIN IN D5W 200-5 MCG/ML-% IV SOLN
2.0000 ug/min | INTRAVENOUS | Status: DC
  Filled 2018-12-03: qty 250

## 2018-12-03 MED ORDER — INSULIN ASPART PROT & ASPART (70-30 MIX) 100 UNIT/ML ~~LOC~~ SUSP
30.0000 [IU] | Freq: Two times a day (BID) | SUBCUTANEOUS | Status: DC
  Administered 2018-12-03 – 2018-12-04 (×3): 30 [IU] via SUBCUTANEOUS
  Filled 2018-12-03: qty 10

## 2018-12-03 MED ORDER — POTASSIUM CHLORIDE 2 MEQ/ML IV SOLN
80.0000 meq | INTRAVENOUS | Status: DC
  Filled 2018-12-03: qty 40

## 2018-12-03 MED ORDER — MILRINONE LACTATE IN DEXTROSE 20-5 MG/100ML-% IV SOLN
0.3000 ug/kg/min | INTRAVENOUS | Status: DC
  Filled 2018-12-03: qty 100

## 2018-12-03 MED ORDER — TRANEXAMIC ACID (OHS) BOLUS VIA INFUSION
15.0000 mg/kg | INTRAVENOUS | Status: AC
  Administered 2018-12-05: 1612.5 mg via INTRAVENOUS
  Filled 2018-12-03: qty 1613

## 2018-12-03 MED ORDER — INSULIN REGULAR(HUMAN) IN NACL 100-0.9 UT/100ML-% IV SOLN
INTRAVENOUS | Status: AC
  Administered 2018-12-05: .8 [IU]/h via INTRAVENOUS
  Filled 2018-12-03: qty 100

## 2018-12-03 MED ORDER — MANNITOL 20 % IV SOLN
Freq: Once | INTRAVENOUS | Status: DC
  Filled 2018-12-03: qty 13

## 2018-12-03 MED ORDER — DEXMEDETOMIDINE HCL IN NACL 400 MCG/100ML IV SOLN
0.1000 ug/kg/h | INTRAVENOUS | Status: AC
  Administered 2018-12-05: 0.7 ug/kg/h via INTRAVENOUS
  Filled 2018-12-03: qty 100

## 2018-12-03 MED ORDER — SODIUM CHLORIDE 0.9 % IV SOLN
750.0000 mg | INTRAVENOUS | Status: DC
  Filled 2018-12-03: qty 750

## 2018-12-03 MED ORDER — VANCOMYCIN HCL 10 G IV SOLR
1500.0000 mg | INTRAVENOUS | Status: AC
  Administered 2018-12-05: 1500 mg via INTRAVENOUS
  Filled 2018-12-03: qty 1500

## 2018-12-03 MED ORDER — EPINEPHRINE HCL 5 MG/250ML IV SOLN IN NS
0.0000 ug/min | INTRAVENOUS | Status: AC
  Administered 2018-12-05: 2 ug/min via INTRAVENOUS
  Filled 2018-12-03: qty 250

## 2018-12-03 MED ORDER — DOPAMINE-DEXTROSE 3.2-5 MG/ML-% IV SOLN
0.0000 ug/kg/min | INTRAVENOUS | Status: DC
  Filled 2018-12-03: qty 250

## 2018-12-03 MED ORDER — PLASMA-LYTE 148 IV SOLN
INTRAVENOUS | Status: DC
  Filled 2018-12-03: qty 2.5

## 2018-12-03 MED ORDER — SODIUM CHLORIDE 0.9 % IV SOLN
1.5000 g | INTRAVENOUS | Status: AC
  Administered 2018-12-05: .75 g via INTRAVENOUS
  Administered 2018-12-05: 1.5 g via INTRAVENOUS
  Filled 2018-12-03: qty 1.5

## 2018-12-03 MED ORDER — SODIUM CHLORIDE 0.9 % IV SOLN
INTRAVENOUS | Status: DC
  Filled 2018-12-03: qty 30

## 2018-12-03 MED ORDER — TRANEXAMIC ACID (OHS) PUMP PRIME SOLUTION
2.0000 mg/kg | INTRAVENOUS | Status: DC
  Filled 2018-12-03: qty 2.15

## 2018-12-03 MED ORDER — PHENYLEPHRINE HCL-NACL 20-0.9 MG/250ML-% IV SOLN
30.0000 ug/min | INTRAVENOUS | Status: AC
  Administered 2018-12-05: 20 ug/min via INTRAVENOUS
  Filled 2018-12-03: qty 250

## 2018-12-03 MED ORDER — NOREPINEPHRINE 4 MG/250ML-% IV SOLN
0.0000 ug/min | INTRAVENOUS | Status: DC
  Filled 2018-12-03: qty 250

## 2018-12-03 MED ORDER — VANCOMYCIN HCL 1000 MG IV SOLR
INTRAVENOUS | Status: DC
  Filled 2018-12-03: qty 1000

## 2018-12-03 NOTE — Progress Notes (Addendum)
Cardiology Progress Note  Patient ID: Bradley Soto MRN: [id number] DOB: 02/25/1940 Date of Encounter: 12/03/2018  Primary Cardiologist: Mertie Moores, MD  Subjective  Blood sugar 65 this morning, given crackers.  He was asymptomatic during this episode.  He denies chest pain, shortness of breath, palpitations.  Review of telemetry demonstrates atrial paced rhythm, no arrhythmias noted.  ROS:  All other ROS reviewed and negative. Pertinent positives noted in the HPI.     Inpatient Medications  Scheduled Meds: . allopurinol  100 mg Oral BID  . amiodarone  200 mg Oral Daily  . amLODipine  2.5 mg Oral QHS  . [START ON 12/05/2018] epinephrine  0-10 mcg/min Intravenous To OR  . furosemide  40 mg Oral Daily  . [START ON 12/05/2018] heparin-papaverine-plasmalyte irrigation   Irrigation To OR  . hydrALAZINE  25 mg Oral TID  . insulin aspart protamine- aspart  35 Units Subcutaneous BID WC  . [START ON 12/05/2018] insulin   Intravenous To OR  . isosorbide mononitrate  30 mg Oral Daily  . [START ON 12/05/2018] [facility] Blood Cardioplegia vial (lidocaine/magnesium/mannitol 0.26g-4g-6.4g)   Intracoronary Once  . metoprolol succinate  100 mg Oral Daily  . [START ON 12/05/2018] phenylephrine  30-200 mcg/min Intravenous To OR  . [START ON 12/05/2018] potassium chloride  80 mEq Other To OR  . simvastatin  10 mg Oral QHS  . sodium chloride flush  3 mL Intravenous Q12H  . [START ON 12/05/2018] tranexamic acid  15 mg/kg Intravenous To OR  . [START ON 12/05/2018] tranexamic acid  2 mg/kg Intracatheter To OR  . [START ON 12/05/2018] vancomycin 1000 mg in NS (1000 ml) irrigation for Dr. Roxy Manns case   Irrigation To OR   Continuous Infusions: . sodium chloride    . [START ON 12/05/2018] cefUROXime (ZINACEF)  IV    . [START ON 12/05/2018] cefUROXime (ZINACEF)  IV    . [START ON 12/05/2018] dexmedetomidine    . [START ON 12/05/2018] DOPamine    . [START ON 12/05/2018] heparin 30,000 units/NS 1000 mL  solution for CELLSAVER    . heparin 1,300 Units/hr (12/03/18 0729)  . [START ON 12/05/2018] milrinone    . [START ON 12/05/2018] nitroGLYCERIN    . [START ON 12/05/2018] norepinephrine (LEVOPHED) Adult infusion    . [START ON 12/05/2018] tranexamic acid (CYKLOKAPRON) infusion ([facility])    . [START ON 12/05/2018] vancomycin     PRN Meds: sodium chloride, acetaminophen, diltiazem, nitroGLYCERIN, ondansetron (ZOFRAN) IV, sodium chloride flush   Vital Signs   Vitals:   12/02/18 1648 12/02/18 1929 12/03/18 0457 12/03/18 0543  BP: (!) 146/76 123/65 131/71   Pulse: 60 61 62   Resp: 18 20 20    Temp: 97.7 F (36.5 C) 97.8 F (36.6 C) 98 F (36.7 C)   TempSrc: Oral Oral Oral   SpO2: 95% 95% 94%   Weight:    107.5 kg  Height:        Intake/Output Summary (Last 24 hours) at 12/03/2018 0920 Last data filed at 12/03/2018 0836 Gross per 24 hour  Intake 807.69 ml  Output 1000 ml  Net -192.31 ml   Last 3 Weights 12/03/2018 12/02/2018 12/01/2018  Weight (lbs) 237 lb 1.6 oz 240 lb 238 lb 11.2 oz  Weight (kg) 107.548 kg 108.863 kg 108.274 kg      Telemetry  Overnight telemetry shows atrial paced rhythm with heart rate in the 60s, which I personally reviewed.   Physical Exam   Vitals:   12/02/18 1648 12/02/18 1929  12/03/18 0457 12/03/18 0543  BP: (!) 146/76 123/65 131/71   Pulse: 60 61 62   Resp: 18 20 20    Temp: 97.7 F (36.5 C) 97.8 F (36.6 C) 98 F (36.7 C)   TempSrc: Oral Oral Oral   SpO2: 95% 95% 94%   Weight:    107.5 kg  Height:         Intake/Output Summary (Last 24 hours) at 12/03/2018 0920 Last data filed at 12/03/2018 0836 Gross per 24 hour  Intake 807.69 ml  Output 1000 ml  Net -192.31 ml    Last 3 Weights 12/03/2018 12/02/2018 12/01/2018  Weight (lbs) 237 lb 1.6 oz 240 lb 238 lb 11.2 oz  Weight (kg) 107.548 kg 108.863 kg 108.274 kg    Body mass index is 32.61 kg/m.  General: Well nourished, well developed, in no acute distress Head: Atraumatic, normal  size  Eyes: PEERLA, EOMI  Neck: Supple, no JVD Endocrine: No thryomegaly Cardiac: Normal S1, S2; RRR; no murmurs, rubs, or gallops Lungs: Clear to auscultation bilaterally, no wheezing, rhonchi or rales  Abd: Soft, nontender, no hepatomegaly  Ext: No edema, pulses 2+ Musculoskeletal: No deformities, BUE and BLE strength normal and equal Skin: Warm and dry, no rashes   Neuro: Alert and oriented to person, place, time, and situation, CNII-XII grossly intact, no focal deficits  Psych: Normal mood and affect   Labs  High Sensitivity Troponin:   Recent Labs  Lab 11/24/18 1605  TROPONINIHS 7     Cardiac EnzymesNo results for input(s): TROPONINI in the last 168 hours. No results for input(s): TROPIPOC in the last 168 hours.  Chemistry Recent Labs  Lab 12/01/18 0510 12/02/18 0333 12/03/18 0446  NA 142 143 140  K 4.2 4.3 4.6  CL 118* 115* 114*  CO2 18* 18* 19*  GLUCOSE 78 74 65*  BUN 33* 40* 46*  CREATININE 2.72* 2.98* 3.18*  CALCIUM 8.3* 8.5* 8.4*  ALBUMIN  --   --  3.1*  GFRNONAA 21* 19* 18*  GFRAA 25* 22* 20*  ANIONGAP 6 10 7     Hematology Recent Labs  Lab 11/28/18 1448 12/01/18 0510 12/03/18 0446  WBC 9.8 7.9 8.2  RBC 4.65 3.91* 3.90*  HGB 14.5 12.2* 12.1*  HCT 44.2 37.1* 37.3*  MCV 95 94.9 95.6  MCH 31.2 31.2 31.0  MCHC 32.8 32.9 32.4  RDW 14.8 14.8 14.8  PLT 176 127* 130*   BNPNo results for input(s): BNP, PROBNP in the last 168 hours.  DDimer No results for input(s): DDIMER in the last 168 hours.   Radiology  Vas US Doppler Pre Cabg  Result Date: 12/01/2018 PREOPERATIVE VASCULAR EVALUATION  Indications: Pre Op CABG. Performing Technologist: Lita Mains RVT  Examination Guidelines: A complete evaluation includes B-mode imaging, spectral Doppler, color Doppler, and power Doppler as needed of all accessible portions of each vessel. Bilateral testing is considered an integral part of a complete examination. Limited examinations for reoccurring indications  may be performed as noted.  Right Carotid Findings: +----------+--------+--------+--------+--------------------------+-------------+           PSV cm/sEDV cm/sStenosisDescribe                  Comments      +----------+--------+--------+--------+--------------------------+-------------+ CCA Prox  89      11                                                      +----------+--------+--------+--------+--------------------------+-------------+  CCA Distal78      14              diffuse and hyperechoic                 +----------+--------+--------+--------+--------------------------+-------------+ ICA Prox  194     38      40-59%  heterogenous, irregular   40-59% by PSV                                   and smooth                              +----------+--------+--------+--------+--------------------------+-------------+ ICA Distal95      28                                                      +----------+--------+--------+--------+--------------------------+-------------+ ECA       213     17      >50%                                            +----------+--------+--------+--------+--------------------------+-------------+ Portions of this table do not appear on this page. +----------+--------+-------+----------------+------------+           PSV cm/sEDV cmsDescribe        Arm Pressure +----------+--------+-------+----------------+------------+ Subclavian116            Multiphasic, [id number]          +----------+--------+-------+----------------+------------+ +---------+--------+--+--------+--+---------+ VertebralPSV cm/s68EDV cm/s15Antegrade +---------+--------+--+--------+--+---------+ Left Carotid Findings: +----------+--------+--------+--------+-------------------------------+--------+           PSV cm/sEDV cm/sStenosisDescribe                       Comments +----------+--------+--------+--------+-------------------------------+--------+ CCA  Prox  105     17                                                      +----------+--------+--------+--------+-------------------------------+--------+ CCA Distal111     14                                                      +----------+--------+--------+--------+-------------------------------+--------+ ICA Prox  136     32              heterogenous, irregular,                                                  diffuse and calcific                    +----------+--------+--------+--------+-------------------------------+--------+ ICA Mid   129     35                                                      +----------+--------+--------+--------+-------------------------------+--------+  ICA Distal118     36                                                      +----------+--------+--------+--------+-------------------------------+--------+ ECA       243     17      >50%    hyperechoic, heterogenous and                                             irregular                               +----------+--------+--------+--------+-------------------------------+--------+ +----------+--------+--------+----------------+------------+ SubclavianPSV cm/sEDV cm/sDescribe        Arm Pressure +----------+--------+--------+----------------+------------+           188             Multiphasic, [id number]          +----------+--------+--------+----------------+------------+ +---------+--------+--+--------+--+---------+ VertebralPSV cm/s66EDV cm/s15Antegrade +---------+--------+--+--------+--+---------+  ABI Findings: +---------+------------------+-----+---------+--------+ Right    Rt Pressure (mmHg)IndexWaveform Comment  +---------+------------------+-----+---------+--------+ Brachial 137                    triphasic         +---------+------------------+-----+---------+--------+ PTA      255               1.83 biphasic           +---------+------------------+-----+---------+--------+ DP       255               1.83 biphasic          +---------+------------------+-----+---------+--------+ Great Toe111               0.80 Normal            +---------+------------------+-----+---------+--------+ +---------+------------------+-----+---------+-------+ Left     Lt Pressure (mmHg)IndexWaveform Comment +---------+------------------+-----+---------+-------+ Brachial 139                    triphasic        +---------+------------------+-----+---------+-------+ PTA      255               1.83 triphasic        +---------+------------------+-----+---------+-------+ DP       204               1.47 triphasic        +---------+------------------+-----+---------+-------+ Donalee Citrin               0.83 Normal           +---------+------------------+-----+---------+-------+  Right Doppler Findings: +--------+--------+-----+---------+--------+ Site    PressureIndexDoppler  Comments +--------+--------+-----+---------+--------+ [id number]          triphasic         +--------+--------+-----+---------+--------+ Radial               triphasic         +--------+--------+-----+---------+--------+ Ulnar                triphasic         +--------+--------+-----+---------+--------+  Left Doppler Findings: +--------+--------+-----+---------+--------+ Site    PressureIndexDoppler  Comments +--------+--------+-----+---------+--------+ [id number]          triphasic         +--------+--------+-----+---------+--------+  Radial               triphasic         +--------+--------+-----+---------+--------+ Ulnar                triphasic         +--------+--------+-----+---------+--------+  Summary: Right Carotid: Velocities in the right ICA are consistent with a 40-59%                stenosis. The ECA appears >50% stenosed. Left Carotid: Velocities in the left ICA are consistent with a  1-39% stenosis.               The ECA appears >50% stenosed. Vertebrals:  Bilateral vertebral arteries demonstrate antegrade flow. Subclavians: Normal flow hemodynamics were seen in bilateral subclavian              arteries. Right ABI: Resting right ankle-brachial index indicates noncompressible right lower extremity arteries. ABIs are unreliable. RT great toe pressure = 111 mmHg. PT and DP are noncompressible. Left ABI: Resting left ankle-brachial index indicates noncompressible left lower extremity arteries. ABIs are unreliable. LT Great toe pressure = 115 mmHg. PT is noncompressible. DP is unreliable. Right Upper Extremity: Doppler waveforms remain within normal limits with right radial compression. Doppler waveforms decrease >50% with right ulnar compression. Left Upper Extremity: Doppler waveforms remain within normal limits with left radial compression. Doppler waveforms decrease >50% with left ulnar compression.  Electronically signed by Servando Snare MD on 12/01/2018 at 3:42:52 PM.    Final     Cardiac Studies  LHC 10/14  Ramus lesion is 99% stenosed.  Prox LAD to Mid LAD lesion is 70% stenosed.  Dist Cx lesion is 60% stenosed.  Mid RCA lesion is 95% stenosed.  3rd Mrg-1 lesion is 80% stenosed.  3rd Mrg-2 lesion is 99% stenosed.  1st LPL lesion is 99% stenosed.   1. Severe multi-vessel CAD 2. Moderately severe mid LAD stenosis 3. Severe restenosis of the stented segment in the proximal segment of the ramus intermediate branch 4. Severe restenosis mid Circumflex/obtuse marginal stented segment. Severe stenosis in the first obtuse marginal branch beyond the stented segment. Severe stenosis distal Circumflex leading into the moderate caliber second obtuse marginal branch.  5. Severe stenosis mid RCA  TTE 12/01/2018  1. Left ventricular ejection fraction, by visual estimation, is 60 to 65%. The left ventricle has normal function. Normal left ventricular size. Left ventricular septal  wall thickness was mildly increased. Mildly increased left ventricular posterior  wall thickness. There is mildly increased left ventricular hypertrophy.  2. Left ventricular diastolic Doppler parameters are consistent with impaired relaxation pattern of LV diastolic filling.  3. Global right ventricle has normal systolic function.The right ventricular size is normal. No increase in right ventricular wall thickness.  4. Left atrial size was mildly dilated.  5. Right atrial size was normal.  6. Trivial pericardial effusion is present.  7. The mitral valve is normal in structure. No evidence of mitral valve regurgitation. No evidence of mitral stenosis.  8. The tricuspid valve is normal in structure. Tricuspid valve regurgitation was not visualized by color flow Doppler.  9. The aortic valve is normal in structure. Aortic valve regurgitation was not visualized by color flow Doppler. Structurally normal aortic valve, with no evidence of sclerosis or stenosis. 10. The pulmonic valve was normal in structure. Pulmonic valve regurgitation is not visualized by color flow Doppler. 11. TR signal is inadequate for assessing pulmonary artery systolic pressure. 12. The  inferior vena cava is normal in size with <50% respiratory variability, suggesting right atrial pressure of 8 mmHg.  Carotid/ABI Right Carotid: Velocities in the right ICA are consistent with a 40-59%                stenosis. The ECA appears >50% stenosed.  Left Carotid: Velocities in the left ICA are consistent with a 1-39% stenosis.               The ECA appears >50% stenosed. Vertebrals:  Bilateral vertebral arteries demonstrate antegrade flow. Subclavians: Normal flow hemodynamics were seen in bilateral subclavian              arteries.  Right ABI: Resting right ankle-brachial index indicates noncompressible right lower extremity arteries. ABIs are unreliable. RT great toe pressure = 111 mmHg. PT and DP are noncompressible. Left ABI:  Resting left ankle-brachial index indicates noncompressible left lower extremity arteries. ABIs are unreliable. LT Great toe pressure = 115 mmHg. PT is noncompressible. DP is unreliable. Right Upper Extremity: Doppler waveforms remain within normal limits with right radial compression. Doppler waveforms decrease >50% with right ulnar compression. Left Upper Extremity: Doppler waveforms remain within normal limits with left radial compression. Doppler waveforms decrease >50% with left ulnar compression.  Patient Profile  Bradley Soto is a 78 y.o. male with CKD stage IV, diabetes on insulin, sick sinus syndrome status post pacemaker implantation, paroxysmal atrial fibrillation, CAD status post multiple PCI's who was admitted on 10/14, and found to have severe in-stent restenosis with multivessel CAD.  He is currently awaiting bypass surgery on Monday.  Assessment & Plan  1.  Unstable angina/Chest pain/severe multivessel CAD/in-stent restenosis -Cardiac cath with severe triple-vessel disease and he is awaiting bypass on Monday -Echo shows normal ejection fraction -Vascular screening shows no significant carotid disease or subclavian stenoses -We will continue current therapy now -He is chest pain-free and without symptoms -He remains on heparin -Continue home metoprolol, home Imdur, home diltiazem -Continue home statin  2.  Paroxysmal atrial fibrillation on Eliquis at home -Continue home amiodarone -He is on heparin drip  3.  CKD stage IV -Creat stable, with baseline around 2.7-3.3 -Nephrology is following -He understands his risk of requiring hemodialysis post CABG  4.  Hypertension -Well-controlled on home medications  5.  Hyperlipidemia -Well-controlled on home Zocor  6.  Sick sinus syndrome status post pacemaker implantation -Mainly AAI paced on review of telemetry no issues  7.  Diabetes -I have reduced his 70/30 to 30 units twice daily due to hypoglycemia this morning  #  FEN -Diet: Cardiac diet -No intravenous fluids -Daily BMP -DVT prophylaxis: Heparin drip -Code: Full   For questions or updates, please contact [facility] Please consult [email or web address] for contact info under        Signed, [place] T. Audie Box, [place]  12/03/2018 9:20 AM

## 2018-12-03 NOTE — Progress Notes (Signed)
Pt's am CBG was 65 and he was asymptomatic. Juice and graham crackers given. CBG increased to 93.

## 2018-12-03 NOTE — Progress Notes (Signed)
ANTICOAGULATION CONSULT NOTE - Follow Up Consult  Pharmacy Consult for heparin Indication: chest pain/ACS  Allergies  Allergen Reactions  . Shellfish Allergy Rash    Patient Measurements: Height: 5' 11.5" (181.6 cm) Weight: 237 lb 1.6 oz (107.5 kg) IBW/kg (Calculated) : 76.45 Heparin Dosing Weight: 99.6 kg  Vital Signs: Temp: 98 F (36.7 C) (10/17 0457) Temp Source: Oral (10/17 0457) BP: 147/73 (10/17 1043) Pulse Rate: 60 (10/17 1043)  Labs: Recent Labs    12/01/18 0510 12/02/18 0333 12/02/18 1753 12/03/18 0446  HGB 12.2*  --   --  12.1*  HCT 37.1*  --   --  37.3*  PLT 127*  --   --  130*  HEPARINUNFRC  --   --  0.30 0.47  CREATININE 2.72* 2.98*  --  3.18*    Estimated Creatinine Clearance: 23.7 mL/min (A) (by C-G formula based on SCr of 3.18 mg/dL (H)).   Medications:  Medications Prior to Admission  Medication Sig Dispense Refill Last Dose  . allopurinol (ZYLOPRIM) 100 MG tablet Take 100 mg by mouth 2 (two) times daily.   11/29/2018 at Unknown time  . amiodarone (PACERONE) 200 MG tablet Take 1 tablet (200 mg total) by mouth daily. 90 tablet 3 11/30/2018 at 0600  . amLODipine (NORVASC) 2.5 MG tablet Take 2.5 mg by mouth at bedtime.    11/30/2018 at 0600  . apixaban (ELIQUIS) 5 MG TABS tablet Take 5 mg by mouth 2 (two) times daily.   11/28/2018 at 0800  . Cholecalciferol (VITAMIN D-3) 25 MCG (1000 UT) CAPS Take 1,000 Units by mouth 2 (two) times daily.    11/30/2018 at 0600  . diltiazem (CARDIZEM) 60 MG tablet Take 1 tablet (60 mg total) by mouth 3 (three) times daily with meals as needed (palpitations). 30 tablet 6 11/30/2018 at 0600  . diphenhydramine-acetaminophen (TYLENOL PM EXTRA STRENGTH) 25-500 MG TABS tablet Take 0.5 tablets by mouth at bedtime.    11/29/2018 at Unknown time  . furosemide (LASIX) 40 MG tablet Take 40 mg by mouth daily.   11/29/2018 at Unknown time  . Homeopathic Products (SIMILASAN DRY EYE RELIEF OP) Place 1 drop into both eyes daily as  needed (dry eyes).   11/29/2018 at Unknown time  . hydrALAZINE (APRESOLINE) 25 MG tablet TAKE 1 TABLET BY MOUTH THREE TIMES A DAY (Patient taking differently: Take 25 mg by mouth 3 (three) times daily. ) 270 tablet 02 11/30/2018 at 0600  . isosorbide mononitrate (IMDUR) 30 MG 24 hr tablet Take 30 mg by mouth daily.   11/30/2018 at 0600  . metoprolol succinate (TOPROL-XL) 100 MG 24 hr tablet Take 100 mg by mouth daily. Take with or immediately following a meal.   11/30/2018 at 0600  . nitroGLYCERIN (NITROSTAT) 0.4 MG SL tablet Place 1 tablet (0.4 mg total) under the tongue every 5 (five) minutes as needed for chest pain. 25 tablet 6 Past Week at Unknown time  . NOVOLOG MIX 70/30 FLEXPEN (70-30) 100 UNIT/ML FlexPen Inject 35 Units into the skin 2 (two) times daily with a meal.   3 11/29/2018 at Unknown time  . Phenylephrine-Acetaminophen (TYLENOL SINUS+HEADACHE PO) Take 1 tablet by mouth daily as needed (sinus headaches).   Past Month at Unknown time  . simvastatin (ZOCOR) 10 MG tablet Take 10 mg by mouth at bedtime.    11/30/2018 at 0600   Infusions:  . sodium chloride    . [START ON 12/05/2018] cefUROXime (ZINACEF)  IV    . [START ON 12/05/2018]  cefUROXime (ZINACEF)  IV    . [START ON 12/05/2018] dexmedetomidine    . [START ON 12/05/2018] DOPamine    . [START ON 12/05/2018] heparin 30,000 units/NS 1000 mL solution for CELLSAVER    . heparin 1,300 Units/hr (12/03/18 0729)  . [START ON 12/05/2018] milrinone    . [START ON 12/05/2018] nitroGLYCERIN    . [START ON 12/05/2018] norepinephrine (LEVOPHED) Adult infusion    . [START ON 12/05/2018] tranexamic acid (CYKLOKAPRON) infusion ([facility])    . [START ON 12/05/2018] vancomycin      Assessment: 78 yo male s/p cath 10/14 with multivessel CAD and plans for CABG on Monday. He is also noted with history of afib (CHADSVASC=6) on apixaban PTA (last dose taken 10/12).  Pharmacy to dose heparin.   Heparin level today is therapeutic at 0.47 on 1300  units/hour. H&H is stable since yesterday at 12.1/37.3, plts are low at 130 but increased since yesterday.     Goal of Therapy:  Heparin level 0.3-0.7 units/ml Monitor platelets by anticoagulation protocol: Yes   Plan:  Continue heparin infusion at 1300 units/hr Check anti-Xa level daily while on heparin Continue to monitor H&H and platelets  Monitor signs of bleeding  Follow up restart of Eliquis    Thank you,   Eddie Candle, PharmD PGY-1 Pharmacy Resident   Please check amion for clinical pharmacist contact number

## 2018-12-03 NOTE — Progress Notes (Signed)
Patient ID: Bradley Soto, male   DOB: Feb 21, 1940, 78 y.o.   MRN: [id number] S: No chest pain and overall feels well O:BP 131/71 (BP Location: Right Arm)   Pulse 62   Temp 98 F (36.7 C) (Oral)   Resp 20   Ht 5' 11.5" (1.816 m)   Wt 107.5 kg   SpO2 94%   BMI 32.61 kg/m   Intake/Output Summary (Last 24 hours) at 12/03/2018 1034 Last data filed at 12/03/2018 0836 Gross per 24 hour  Intake 807.69 ml  Output 1000 ml  Net -192.31 ml   Intake/Output: I/O last 3 completed shifts: In: 1247.7 [P.O.:1040; I.V.:207.7] Out: 650 [Urine:650]  Intake/Output this shift:  Total I/O In: 240 [P.O.:240] Out: 350 [Urine:350] Weight change: -1.315 kg Gen: NAD CVS: no rub Resp: cta Abd: +BS, soft, NT  Ext: no edema  Recent Labs  Lab 11/28/18 1448 11/30/18 1127 12/01/18 0510 12/02/18 0333 12/03/18 0446  NA 144 141 142 143 140  K 5.4* 4.5 4.2 4.3 4.6  CL 111* 114* 118* 115* 114*  CO2 17* 18* 18* 18* 19*  GLUCOSE 240* 135* 78 74 65*  BUN 42* 34* 33* 40* 46*  CREATININE 3.29* 2.76* 2.72* 2.98* 3.18*  ALBUMIN  --   --   --   --  3.1*  CALCIUM 9.5 8.5* 8.3* 8.5* 8.4*  PHOS  --   --   --   --  4.6   Liver Function Tests: Recent Labs  Lab 12/03/18 0446  ALBUMIN 3.1*   No results for input(s): LIPASE, AMYLASE in the last 168 hours. No results for input(s): AMMONIA in the last 168 hours. CBC: Recent Labs  Lab 11/28/18 1448 12/01/18 0510 12/03/18 0446  WBC 9.8 7.9 8.2  HGB 14.5 12.2* 12.1*  HCT 44.2 37.1* 37.3*  MCV 95 94.9 95.6  PLT 176 127* 130*   Cardiac Enzymes: No results for input(s): CKTOTAL, CKMB, CKMBINDEX, TROPONINI in the last 168 hours. CBG: Recent Labs  Lab 12/02/18 0546 12/02/18 1107 12/02/18 1602 12/02/18 2058 12/03/18 0642  GLUCAP 91 98 89 111* 93    Iron Studies: No results for input(s): IRON, TIBC, TRANSFERRIN, FERRITIN in the last 72 hours. Studies/Results: Vas US Doppler Pre Cabg  Result Date: 12/01/2018 PREOPERATIVE VASCULAR EVALUATION   Indications: Pre Op CABG. Performing Technologist: Lita Mains RVT  Examination Guidelines: A complete evaluation includes B-mode imaging, spectral Doppler, color Doppler, and power Doppler as needed of all accessible portions of each vessel. Bilateral testing is considered an integral part of a complete examination. Limited examinations for reoccurring indications may be performed as noted.  Right Carotid Findings: +----------+--------+--------+--------+--------------------------+-------------+           PSV cm/sEDV cm/sStenosisDescribe                  Comments      +----------+--------+--------+--------+--------------------------+-------------+ CCA Prox  89      11                                                      +----------+--------+--------+--------+--------------------------+-------------+ CCA Distal78      14              diffuse and hyperechoic                 +----------+--------+--------+--------+--------------------------+-------------+ ICA Prox  194  38      40-59%  heterogenous, irregular   40-59% by PSV                                   and smooth                              +----------+--------+--------+--------+--------------------------+-------------+ ICA Distal95      28                                                      +----------+--------+--------+--------+--------------------------+-------------+ ECA       213     17      >50%                                            +----------+--------+--------+--------+--------------------------+-------------+ Portions of this table do not appear on this page. +----------+--------+-------+----------------+------------+           PSV cm/sEDV cmsDescribe        Arm Pressure +----------+--------+-------+----------------+------------+ Subclavian116            Multiphasic, [id number]          +----------+--------+-------+----------------+------------+  +---------+--------+--+--------+--+---------+ VertebralPSV cm/s68EDV cm/s15Antegrade +---------+--------+--+--------+--+---------+ Left Carotid Findings: +----------+--------+--------+--------+-------------------------------+--------+           PSV cm/sEDV cm/sStenosisDescribe                       Comments +----------+--------+--------+--------+-------------------------------+--------+ CCA Prox  105     17                                                      +----------+--------+--------+--------+-------------------------------+--------+ CCA Distal111     14                                                      +----------+--------+--------+--------+-------------------------------+--------+ ICA Prox  136     32              heterogenous, irregular,                                                  diffuse and calcific                    +----------+--------+--------+--------+-------------------------------+--------+ ICA Mid   129     35                                                      +----------+--------+--------+--------+-------------------------------+--------+ ICA [id number]  36                                                      +----------+--------+--------+--------+-------------------------------+--------+ ECA       243     17      >50%    hyperechoic, heterogenous and                                             irregular                               +----------+--------+--------+--------+-------------------------------+--------+ +----------+--------+--------+----------------+------------+ SubclavianPSV cm/sEDV cm/sDescribe        Arm Pressure +----------+--------+--------+----------------+------------+           188             Multiphasic, [id number]          +----------+--------+--------+----------------+------------+ +---------+--------+--+--------+--+---------+ VertebralPSV cm/s66EDV cm/s15Antegrade  +---------+--------+--+--------+--+---------+  ABI Findings: +---------+------------------+-----+---------+--------+ Right    Rt Pressure (mmHg)IndexWaveform Comment  +---------+------------------+-----+---------+--------+ Brachial 137                    triphasic         +---------+------------------+-----+---------+--------+ PTA      255               1.83 biphasic          +---------+------------------+-----+---------+--------+ DP       255               1.83 biphasic          +---------+------------------+-----+---------+--------+ Great Toe111               0.80 Normal            +---------+------------------+-----+---------+--------+ +---------+------------------+-----+---------+-------+ Left     Lt Pressure (mmHg)IndexWaveform Comment +---------+------------------+-----+---------+-------+ Brachial 139                    triphasic        +---------+------------------+-----+---------+-------+ PTA      255               1.83 triphasic        +---------+------------------+-----+---------+-------+ DP       204               1.47 triphasic        +---------+------------------+-----+---------+-------+ Donalee Citrin               0.83 Normal           +---------+------------------+-----+---------+-------+  Right Doppler Findings: +--------+--------+-----+---------+--------+ Site    PressureIndexDoppler  Comments +--------+--------+-----+---------+--------+ [id number]          triphasic         +--------+--------+-----+---------+--------+ Radial               triphasic         +--------+--------+-----+---------+--------+ Ulnar                triphasic         +--------+--------+-----+---------+--------+  Left Doppler Findings: +--------+--------+-----+---------+--------+ Site    PressureIndexDoppler  Comments +--------+--------+-----+---------+--------+ [id number]          triphasic          +--------+--------+-----+---------+--------+  Radial               triphasic         +--------+--------+-----+---------+--------+ Ulnar                triphasic         +--------+--------+-----+---------+--------+  Summary: Right Carotid: Velocities in the right ICA are consistent with a 40-59%                stenosis. The ECA appears >50% stenosed. Left Carotid: Velocities in the left ICA are consistent with a 1-39% stenosis.               The ECA appears >50% stenosed. Vertebrals:  Bilateral vertebral arteries demonstrate antegrade flow. Subclavians: Normal flow hemodynamics were seen in bilateral subclavian              arteries. Right ABI: Resting right ankle-brachial index indicates noncompressible right lower extremity arteries. ABIs are unreliable. RT great toe pressure = 111 mmHg. PT and DP are noncompressible. Left ABI: Resting left ankle-brachial index indicates noncompressible left lower extremity arteries. ABIs are unreliable. LT Great toe pressure = 115 mmHg. PT is noncompressible. DP is unreliable. Right Upper Extremity: Doppler waveforms remain within normal limits with right radial compression. Doppler waveforms decrease >50% with right ulnar compression. Left Upper Extremity: Doppler waveforms remain within normal limits with left radial compression. Doppler waveforms decrease >50% with left ulnar compression.  Electronically signed by Servando Snare MD on 12/01/2018 at 3:42:52 PM.    Final    . allopurinol  100 mg Oral BID  . amiodarone  200 mg Oral Daily  . amLODipine  2.5 mg Oral QHS  . [START ON 12/05/2018] epinephrine  0-10 mcg/min Intravenous To OR  . furosemide  40 mg Oral Daily  . [START ON 12/05/2018] heparin-papaverine-plasmalyte irrigation   Irrigation To OR  . hydrALAZINE  25 mg Oral TID  . insulin aspart protamine- aspart  30 Units Subcutaneous BID WC  . [START ON 12/05/2018] insulin   Intravenous To OR  . isosorbide mononitrate  30 mg Oral Daily  . [START ON  12/05/2018] [facility] Blood Cardioplegia vial (lidocaine/magnesium/mannitol 0.26g-4g-6.4g)   Intracoronary Once  . metoprolol succinate  100 mg Oral Daily  . [START ON 12/05/2018] phenylephrine  30-200 mcg/min Intravenous To OR  . [START ON 12/05/2018] potassium chloride  80 mEq Other To OR  . simvastatin  10 mg Oral QHS  . sodium chloride flush  3 mL Intravenous Q12H  . [START ON 12/05/2018] tranexamic acid  15 mg/kg Intravenous To OR  . [START ON 12/05/2018] tranexamic acid  2 mg/kg Intracatheter To OR  . [START ON 12/05/2018] vancomycin 1000 mg in NS (1000 ml) irrigation for Dr. Roxy Manns case   Irrigation To OR    BMET    Component Value Date/Time   NA 140 12/03/2018 0446   NA 144 11/28/2018 1448   K 4.6 12/03/2018 0446   CL 114 (H) 12/03/2018 0446   CO2 19 (L) 12/03/2018 0446   GLUCOSE 65 (L) 12/03/2018 0446   BUN 46 (H) 12/03/2018 0446   BUN 42 (H) 11/28/2018 1448   CREATININE 3.18 (H) 12/03/2018 0446   CALCIUM 8.4 (L) 12/03/2018 0446   GFRNONAA 18 (L) 12/03/2018 0446   GFRAA 20 (L) 12/03/2018 0446   CBC    Component Value Date/Time   WBC 8.2 12/03/2018 0446   RBC 3.90 (L) 12/03/2018 0446   HGB 12.1 (L) 12/03/2018 0446   HGB 14.5  11/28/2018 1448   HCT 37.3 (L) 12/03/2018 0446   HCT 44.2 11/28/2018 1448   PLT 130 (L) 12/03/2018 0446   PLT 176 11/28/2018 1448   MCV 95.6 12/03/2018 0446   MCV 95 11/28/2018 1448   MCH 31.0 12/03/2018 0446   MCHC 32.4 12/03/2018 0446   RDW 14.8 12/03/2018 0446   RDW 14.8 11/28/2018 1448   LYMPHSABS 1.6 04/01/2016 1303   MONOABS 0.5 10/10/2013 1017   EOSABS 0.5 (H) 04/01/2016 1303   BASOSABS 0.0 04/01/2016 1303    Assessment/Plan: 1. CAD- severe, multivessel:  Plan for CABG on 12/05/18 per Dr. Orvan Seen. 2. CKD stage 4/A3- baseline Cr 2.8-3.3.  Per Dr. Orvan Seen, his risk for renal failure is 9.2% using the STS on-line risk calculator, his overall risk for ESRD is 49 % in 2 years and 89% in 5 years using kidney failure risk calculator.  He  understands the consequences and is willing to proceed with CABG.  He has not discussed dialysis in the past but admits that he would rather avoid it altogether.  Will continue to follow along during this hospitalization and hopefully he can avoid the need for dialysis post-operatively.  Will have him watch educational videos about dialysis 3. Anemia of CKD- stable 4. P A fib- was on amio and eliquis at home.  Will transition to heparin 5. Chronic diastolic CHF- euvolemic 6. HTN- stable 7. HLD- on statin 8. SSS s/p pacemaker 9. DM type 2- Hgb A1c 8.3% per primary svc  Donetta Potts, MD [facility] [phone]

## 2018-12-04 LAB — CBC
HCT: 37.6 % — ABNORMAL LOW (ref 39.0–52.0)
Hemoglobin: 12.2 g/dL — ABNORMAL LOW (ref 13.0–17.0)
MCH: 31.2 pg (ref 26.0–34.0)
MCHC: 32.4 g/dL (ref 30.0–36.0)
MCV: 96.2 fL (ref 80.0–100.0)
Platelets: 129 10*3/uL — ABNORMAL LOW (ref 150–400)
RBC: 3.91 MIL/uL — ABNORMAL LOW (ref 4.22–5.81)
RDW: 15.1 % (ref 11.5–15.5)
WBC: 7.7 10*3/uL (ref 4.0–10.5)
nRBC: 0 % (ref 0.0–0.2)

## 2018-12-04 LAB — RENAL FUNCTION PANEL
Albumin: 3 g/dL — ABNORMAL LOW (ref 3.5–5.0)
Anion gap: 10 (ref 5–15)
BUN: 40 mg/dL — ABNORMAL HIGH (ref 8–23)
CO2: 19 mmol/L — ABNORMAL LOW (ref 22–32)
Calcium: 8.3 mg/dL — ABNORMAL LOW (ref 8.9–10.3)
Chloride: 114 mmol/L — ABNORMAL HIGH (ref 98–111)
Creatinine, Ser: 3.26 mg/dL — ABNORMAL HIGH (ref 0.61–1.24)
GFR calc Af Amer: 20 mL/min — ABNORMAL LOW (ref >60)
GFR calc non Af Amer: 17 mL/min — ABNORMAL LOW (ref >60)
Glucose, Bld: 101 mg/dL — ABNORMAL HIGH (ref 70–99)
Phosphorus: 4.9 mg/dL — ABNORMAL HIGH (ref 2.5–4.6)
Potassium: 4.5 mmol/L (ref 3.5–5.1)
Sodium: 143 mmol/L (ref 135–145)

## 2018-12-04 LAB — GLUCOSE, CAPILLARY
Glucose-Capillary: 102 mg/dL — ABNORMAL HIGH (ref 70–99)
Glucose-Capillary: 106 mg/dL — ABNORMAL HIGH (ref 70–99)
Glucose-Capillary: 186 mg/dL — ABNORMAL HIGH (ref 70–99)
Glucose-Capillary: 112 mg/dL — ABNORMAL HIGH (ref 70–99)

## 2018-12-04 LAB — HEPARIN LEVEL (UNFRACTIONATED): Heparin Unfractionated: 0.48 IU/mL (ref 0.30–0.70)

## 2018-12-04 NOTE — Progress Notes (Signed)
Patient ID: Hakan Nudelman, male   DOB: May 10, 1940, 78 y.o.   MRN: [id number] S: No new complaints O:BP 112/69 (BP Location: Right Arm)   Pulse (!) 105   Temp 98.3 F (36.8 C) (Oral)   Resp 16   Ht 5' 11.5" (1.816 m)   Wt 107 kg Comment: scale a  SpO2 96%   BMI 32.44 kg/m   Intake/Output Summary (Last 24 hours) at 12/04/2018 1019 Last data filed at 12/04/2018 0815 Gross per 24 hour  Intake 680 ml  Output 1400 ml  Net -720 ml   Intake/Output: I/O last 3 completed shifts: In: 1247.7 [P.O.:1040; I.V.:207.7] Out: 2400 [Urine:2400]  Intake/Output this shift:  Total I/O In: 240 [P.O.:240] Out: -  Weight change: -0.544 kg Gen: NAD CVS: no rub  Resp: cta Abd: benign Ext: no edema  Recent Labs  Lab 11/28/18 1448 11/30/18 1127 12/01/18 0510 12/02/18 0333 12/03/18 0446 12/04/18 0533  NA 144 141 142 143 140 143  K 5.4* 4.5 4.2 4.3 4.6 4.5  CL 111* 114* 118* 115* 114* 114*  CO2 17* 18* 18* 18* 19* 19*  GLUCOSE 240* 135* 78 74 65* 101*  BUN 42* 34* 33* 40* 46* 40*  CREATININE 3.29* 2.76* 2.72* 2.98* 3.18* 3.26*  ALBUMIN  --   --   --   --  3.1* 3.0*  CALCIUM 9.5 8.5* 8.3* 8.5* 8.4* 8.3*  PHOS  --   --   --   --  4.6 4.9*   Liver Function Tests: Recent Labs  Lab 12/03/18 0446 12/04/18 0533  ALBUMIN 3.1* 3.0*   No results for input(s): LIPASE, AMYLASE in the last 168 hours. No results for input(s): AMMONIA in the last 168 hours. CBC: Recent Labs  Lab 11/28/18 1448 12/01/18 0510 12/03/18 0446 12/04/18 0533  WBC 9.8 7.9 8.2 7.7  HGB 14.5 12.2* 12.1* 12.2*  HCT 44.2 37.1* 37.3* 37.6*  MCV 95 94.9 95.6 96.2  PLT 176 127* 130* 129*   Cardiac Enzymes: No results for input(s): CKTOTAL, CKMB, CKMBINDEX, TROPONINI in the last 168 hours. CBG: Recent Labs  Lab 12/03/18 0642 12/03/18 1102 12/03/18 1558 12/03/18 2147 12/04/18 0605  GLUCAP 93 197* 202* 159* 102*    Iron Studies: No results for input(s): IRON, TIBC, TRANSFERRIN, FERRITIN in the last 72  hours. Studies/Results: No results found. Marland Kitchen allopurinol  100 mg Oral BID  . amiodarone  200 mg Oral Daily  . amLODipine  2.5 mg Oral QHS  . [START ON 12/05/2018] epinephrine  0-10 mcg/min Intravenous To OR  . furosemide  40 mg Oral Daily  . [START ON 12/05/2018] heparin-papaverine-plasmalyte irrigation   Irrigation To OR  . hydrALAZINE  25 mg Oral TID  . insulin aspart protamine- aspart  30 Units Subcutaneous BID WC  . [START ON 12/05/2018] insulin   Intravenous To OR  . isosorbide mononitrate  30 mg Oral Daily  . [START ON 12/05/2018] [facility] Blood Cardioplegia vial (lidocaine/magnesium/mannitol 0.26g-4g-6.4g)   Intracoronary Once  . metoprolol succinate  100 mg Oral Daily  . [START ON 12/05/2018] phenylephrine  30-200 mcg/min Intravenous To OR  . [START ON 12/05/2018] potassium chloride  80 mEq Other To OR  . simvastatin  10 mg Oral QHS  . sodium chloride flush  3 mL Intravenous Q12H  . [START ON 12/05/2018] tranexamic acid  15 mg/kg Intravenous To OR  . [START ON 12/05/2018] tranexamic acid  2 mg/kg Intracatheter To OR  . [START ON 12/05/2018] vancomycin 1000 mg in NS (1000 ml)  irrigation for Dr. Roxy Manns case   Irrigation To OR    BMET    Component Value Date/Time   NA 143 12/04/2018 0533   NA 144 11/28/2018 1448   K 4.5 12/04/2018 0533   CL 114 (H) 12/04/2018 0533   CO2 19 (L) 12/04/2018 0533   GLUCOSE 101 (H) 12/04/2018 0533   BUN 40 (H) 12/04/2018 0533   BUN 42 (H) 11/28/2018 1448   CREATININE 3.26 (H) 12/04/2018 0533   CALCIUM 8.3 (L) 12/04/2018 0533   GFRNONAA 17 (L) 12/04/2018 0533   GFRAA 20 (L) 12/04/2018 0533   CBC    Component Value Date/Time   WBC 7.7 12/04/2018 0533   RBC 3.91 (L) 12/04/2018 0533   HGB 12.2 (L) 12/04/2018 0533   HGB 14.5 11/28/2018 1448   HCT 37.6 (L) 12/04/2018 0533   HCT 44.2 11/28/2018 1448   PLT 129 (L) 12/04/2018 0533   PLT 176 11/28/2018 1448   MCV 96.2 12/04/2018 0533   MCV 95 11/28/2018 1448   MCH 31.2 12/04/2018 0533    MCHC 32.4 12/04/2018 0533   RDW 15.1 12/04/2018 0533   RDW 14.8 11/28/2018 1448   LYMPHSABS 1.6 04/01/2016 1303   MONOABS 0.5 10/10/2013 1017   EOSABS 0.5 (H) 04/01/2016 1303   BASOSABS 0.0 04/01/2016 1303    Assessment/Plan: 1. CAD- severe, multivessel: Plan for CABG on 12/05/18 per Dr. Orvan Seen. 2. CKD stage 4/A3- baseline Cr 2.8-3.3. Per Dr. Orvan Seen, his risk for renal failure is 9.2% using the STS on-line risk calculator, his overall risk for ESRD is 49 % in 2 years and 89% in 5 years using kidney failure risk calculator. He understands the consequences and is willing to proceed with CABG. He has not discussed dialysis in the past but admits that he would rather avoid it altogether. Will continue to follow along during this hospitalization and hopefully he can avoid the need for dialysis post-operatively.  Will have him watch educational videos about dialysis 3. Anemia of CKD- stable 4. P A fib- was on amio and eliquis at home. Will transition to heparin 5. Chronic diastolic CHF- euvolemic 6. HTN- stable 7. HLD- on statin 8. SSS s/p pacemaker 9. DM type 2- Hgb A1c 8.3% per primary svc  Donetta Potts, MD [facility] [phone]

## 2018-12-04 NOTE — Progress Notes (Signed)
ANTICOAGULATION CONSULT NOTE - Follow Up Consult  Pharmacy Consult for heparin Indication: chest pain/ACS  Allergies  Allergen Reactions  . Shellfish Allergy Rash    Patient Measurements: Height: 5' 11.5" (181.6 cm) Weight: 235 lb 14.4 oz (107 kg)(scale a) IBW/kg (Calculated) : 76.45 Heparin Dosing Weight: 99.6 kg  Vital Signs: Temp: 98.3 F (36.8 C) (10/18 0626) Temp Source: Oral (10/18 0626) BP: 112/69 (10/18 0626) Pulse Rate: 105 (10/18 0626)  Labs: Recent Labs    12/02/18 0333 12/02/18 1753 12/03/18 0446 12/04/18 0533  HGB  --   --  12.1* 12.2*  HCT  --   --  37.3* 37.6*  PLT  --   --  130* 129*  HEPARINUNFRC  --  0.30 0.47 0.48  CREATININE 2.98*  --  3.18* 3.26*    Estimated Creatinine Clearance: 23.1 mL/min (A) (by C-G formula based on SCr of 3.26 mg/dL (H)).   Medications:  Medications Prior to Admission  Medication Sig Dispense Refill Last Dose  . allopurinol (ZYLOPRIM) 100 MG tablet Take 100 mg by mouth 2 (two) times daily.   11/29/2018 at Unknown time  . amiodarone (PACERONE) 200 MG tablet Take 1 tablet (200 mg total) by mouth daily. 90 tablet 3 11/30/2018 at 0600  . amLODipine (NORVASC) 2.5 MG tablet Take 2.5 mg by mouth at bedtime.    11/30/2018 at 0600  . apixaban (ELIQUIS) 5 MG TABS tablet Take 5 mg by mouth 2 (two) times daily.   11/28/2018 at 0800  . Cholecalciferol (VITAMIN D-3) 25 MCG (1000 UT) CAPS Take 1,000 Units by mouth 2 (two) times daily.    11/30/2018 at 0600  . diltiazem (CARDIZEM) 60 MG tablet Take 1 tablet (60 mg total) by mouth 3 (three) times daily with meals as needed (palpitations). 30 tablet 6 11/30/2018 at 0600  . diphenhydramine-acetaminophen (TYLENOL PM EXTRA STRENGTH) 25-500 MG TABS tablet Take 0.5 tablets by mouth at bedtime.    11/29/2018 at Unknown time  . furosemide (LASIX) 40 MG tablet Take 40 mg by mouth daily.   11/29/2018 at Unknown time  . Homeopathic Products (SIMILASAN DRY EYE RELIEF OP) Place 1 drop into both eyes  daily as needed (dry eyes).   11/29/2018 at Unknown time  . hydrALAZINE (APRESOLINE) 25 MG tablet TAKE 1 TABLET BY MOUTH THREE TIMES A DAY (Patient taking differently: Take 25 mg by mouth 3 (three) times daily. ) 270 tablet 02 11/30/2018 at 0600  . isosorbide mononitrate (IMDUR) 30 MG 24 hr tablet Take 30 mg by mouth daily.   11/30/2018 at 0600  . metoprolol succinate (TOPROL-XL) 100 MG 24 hr tablet Take 100 mg by mouth daily. Take with or immediately following a meal.   11/30/2018 at 0600  . nitroGLYCERIN (NITROSTAT) 0.4 MG SL tablet Place 1 tablet (0.4 mg total) under the tongue every 5 (five) minutes as needed for chest pain. 25 tablet 6 Past Week at Unknown time  . NOVOLOG MIX 70/30 FLEXPEN (70-30) 100 UNIT/ML FlexPen Inject 35 Units into the skin 2 (two) times daily with a meal.   3 11/29/2018 at Unknown time  . Phenylephrine-Acetaminophen (TYLENOL SINUS+HEADACHE PO) Take 1 tablet by mouth daily as needed (sinus headaches).   Past Month at Unknown time  . simvastatin (ZOCOR) 10 MG tablet Take 10 mg by mouth at bedtime.    11/30/2018 at 0600   Infusions:  . sodium chloride    . [START ON 12/05/2018] cefUROXime (ZINACEF)  IV    . [START ON 12/05/2018] cefUROXime (  ZINACEF)  IV    . [START ON 12/05/2018] dexmedetomidine    . [START ON 12/05/2018] DOPamine    . [START ON 12/05/2018] heparin 30,000 units/NS 1000 mL solution for CELLSAVER    . heparin 1,300 Units/hr (12/04/18 0255)  . [START ON 12/05/2018] milrinone    . [START ON 12/05/2018] nitroGLYCERIN    . [START ON 12/05/2018] norepinephrine (LEVOPHED) Adult infusion    . [START ON 12/05/2018] tranexamic acid (CYKLOKAPRON) infusion ([facility])    . [START ON 12/05/2018] vancomycin      Assessment: 78 yo male s/p cath 10/14 with multivessel CAD and plans for CABG on 10/19. He is also noted with history of afib (CHADSVASC=6) on apixaban PTA (last dose taken 10/12).  Pharmacy to dose heparin.   Heparin level today continues to be therapeutic at  0.48 on 1300 units/hour. H&H is low but stable at 12.2/37.6, plts are low but have been stable x 3 days at 139.    Goal of Therapy:  Heparin level 0.3-0.7 units/ml Monitor platelets by anticoagulation protocol: Yes   Plan:  Continue heparin infusion at 1300 units/hr Check anti-Xa level daily while on heparin Continue to monitor H&H and platelets  Monitor signs of bleeding  Follow up restart of Eliquis post CABG    Thank you,   Eddie Candle, PharmD PGY-1 Pharmacy Resident   Please check amion for clinical pharmacist contact number

## 2018-12-04 NOTE — Progress Notes (Signed)
Cardiology Progress Note  Patient ID: Bradley Soto MRN: [id number] DOB: May 08, 1940 Date of Encounter: 12/04/2018  Primary Cardiologist: Mertie Moores, MD  Subjective  No major issues overnight.  Blood sugar better this morning after reduced dose of insulin yesterday.  Denies chest pain, shortness of breath, palpitations.  Telemetry unremarkable.  ROS:  All other ROS reviewed and negative. Pertinent positives noted in the HPI.     Inpatient Medications  Scheduled Meds: . allopurinol  100 mg Oral BID  . amiodarone  200 mg Oral Daily  . amLODipine  2.5 mg Oral QHS  . [START ON 12/05/2018] epinephrine  0-10 mcg/min Intravenous To OR  . furosemide  40 mg Oral Daily  . [START ON 12/05/2018] heparin-papaverine-plasmalyte irrigation   Irrigation To OR  . hydrALAZINE  25 mg Oral TID  . insulin aspart protamine- aspart  30 Units Subcutaneous BID WC  . [START ON 12/05/2018] insulin   Intravenous To OR  . isosorbide mononitrate  30 mg Oral Daily  . [START ON 12/05/2018] [facility] Blood Cardioplegia vial (lidocaine/magnesium/mannitol 0.26g-4g-6.4g)   Intracoronary Once  . metoprolol succinate  100 mg Oral Daily  . [START ON 12/05/2018] phenylephrine  30-200 mcg/min Intravenous To OR  . [START ON 12/05/2018] potassium chloride  80 mEq Other To OR  . simvastatin  10 mg Oral QHS  . sodium chloride flush  3 mL Intravenous Q12H  . [START ON 12/05/2018] tranexamic acid  15 mg/kg Intravenous To OR  . [START ON 12/05/2018] tranexamic acid  2 mg/kg Intracatheter To OR  . [START ON 12/05/2018] vancomycin 1000 mg in NS (1000 ml) irrigation for Dr. Roxy Manns case   Irrigation To OR   Continuous Infusions: . sodium chloride    . [START ON 12/05/2018] cefUROXime (ZINACEF)  IV    . [START ON 12/05/2018] cefUROXime (ZINACEF)  IV    . [START ON 12/05/2018] dexmedetomidine    . [START ON 12/05/2018] DOPamine    . [START ON 12/05/2018] heparin 30,000 units/NS 1000 mL solution for CELLSAVER    . heparin 1,300  Units/hr (12/04/18 0255)  . [START ON 12/05/2018] milrinone    . [START ON 12/05/2018] nitroGLYCERIN    . [START ON 12/05/2018] norepinephrine (LEVOPHED) Adult infusion    . [START ON 12/05/2018] tranexamic acid (CYKLOKAPRON) infusion ([facility])    . [START ON 12/05/2018] vancomycin     PRN Meds: sodium chloride, acetaminophen, diltiazem, nitroGLYCERIN, ondansetron (ZOFRAN) IV, sodium chloride flush   Vital Signs   Vitals:   12/03/18 1700 12/03/18 2041 12/04/18 0626 12/04/18 1026  BP: (!) 150/83 125/65 112/69 136/67  Pulse:  63 (!) 105 62  Resp:  16 16   Temp:  98.6 F (37 C) 98.3 F (36.8 C)   TempSrc:  Oral Oral   SpO2:  96% 96%   Weight:   107 kg   Height:        Intake/Output Summary (Last 24 hours) at 12/04/2018 1130 Last data filed at 12/04/2018 0815 Gross per 24 hour  Intake 680 ml  Output 1400 ml  Net -720 ml   Last 3 Weights 12/04/2018 12/03/2018 12/02/2018  Weight (lbs) 235 lb 14.4 oz 237 lb 1.6 oz 240 lb  Weight (kg) 107.004 kg 107.548 kg 108.863 kg      Telemetry  Overnight telemetry shows atrial paced rhythm with heart rate in the 60-70 range, no arrhythmias.  Physical Exam   Vitals:   12/03/18 1700 12/03/18 2041 12/04/18 0626 12/04/18 1026  BP: (!) 150/83 125/65 112/69 136/67  Pulse:  63 (!) 105 62  Resp:  16 16   Temp:  98.6 F (37 C) 98.3 F (36.8 C)   TempSrc:  Oral Oral   SpO2:  96% 96%   Weight:   107 kg   Height:         Intake/Output Summary (Last 24 hours) at 12/04/2018 1130 Last data filed at 12/04/2018 0815 Gross per 24 hour  Intake 680 ml  Output 1400 ml  Net -720 ml    Last 3 Weights 12/04/2018 12/03/2018 12/02/2018  Weight (lbs) 235 lb 14.4 oz 237 lb 1.6 oz 240 lb  Weight (kg) 107.004 kg 107.548 kg 108.863 kg    Body mass index is 32.44 kg/m.  General: No acute distress, obese gentleman Head: Atraumatic, normal size  Eyes: PEERLA, EOMI  Neck: Supple, no JVD Endocrine: No thryomegaly Cardiac: Normal S1, S2 no M/R/G Lungs:  Clear to auscultation bilaterally, no wheezing, rhonchi or rales  Abd: Soft, nontender, no hepatomegaly  Ext: No edema, pulses 2+ Musculoskeletal: No deformities, BUE and BLE strength normal and equal Skin: Warm and dry, no rashes   Neuro: Alert and oriented to person, place, time, and situation, CNII-XII grossly intact, no focal deficits   Labs  High Sensitivity Troponin:   Recent Labs  Lab 11/24/18 1605  TROPONINIHS 7     Cardiac EnzymesNo results for input(s): TROPONINI in the last 168 hours. No results for input(s): TROPIPOC in the last 168 hours.  Chemistry Recent Labs  Lab 12/02/18 0333 12/03/18 0446 12/04/18 0533  NA 143 140 143  K 4.3 4.6 4.5  CL 115* 114* 114*  CO2 18* 19* 19*  GLUCOSE 74 65* 101*  BUN 40* 46* 40*  CREATININE 2.98* 3.18* 3.26*  CALCIUM 8.5* 8.4* 8.3*  ALBUMIN  --  3.1* 3.0*  GFRNONAA 19* 18* 17*  GFRAA 22* 20* 20*  ANIONGAP 10 7 10     Hematology Recent Labs  Lab 12/01/18 0510 12/03/18 0446 12/04/18 0533  WBC 7.9 8.2 7.7  RBC 3.91* 3.90* 3.91*  HGB 12.2* 12.1* 12.2*  HCT 37.1* 37.3* 37.6*  MCV 94.9 95.6 96.2  MCH 31.2 31.0 31.2  MCHC 32.9 32.4 32.4  RDW 14.8 14.8 15.1  PLT 127* 130* 129*   BNPNo results for input(s): BNP, PROBNP in the last 168 hours.  DDimer No results for input(s): DDIMER in the last 168 hours.   Radiology  No results found.  Cardiac Studies  LHC 10/14  Ramus lesion is 99% stenosed.  Prox LAD to Mid LAD lesion is 70% stenosed.  Dist Cx lesion is 60% stenosed.  Mid RCA lesion is 95% stenosed.  3rd Mrg-1 lesion is 80% stenosed.  3rd Mrg-2 lesion is 99% stenosed.  1st LPL lesion is 99% stenosed.   1. Severe multi-vessel CAD 2. Moderately severe mid LAD stenosis 3. Severe restenosis of the stented segment in the proximal segment of the ramus intermediate branch 4. Severe restenosis mid Circumflex/obtuse marginal stented segment. Severe stenosis in the first obtuse marginal branch beyond the stented  segment. Severe stenosis distal Circumflex leading into the moderate caliber second obtuse marginal branch.  5. Severe stenosis mid RCA  TTE 12/01/2018  1. Left ventricular ejection fraction, by visual estimation, is 60 to 65%. The left ventricle has normal function. Normal left ventricular size. Left ventricular septal wall thickness was mildly increased. Mildly increased left ventricular posterior  wall thickness. There is mildly increased left ventricular hypertrophy.  2. Left ventricular diastolic Doppler parameters are consistent  with impaired relaxation pattern of LV diastolic filling.  3. Global right ventricle has normal systolic function.The right ventricular size is normal. No increase in right ventricular wall thickness.  4. Left atrial size was mildly dilated.  5. Right atrial size was normal.  6. Trivial pericardial effusion is present.  7. The mitral valve is normal in structure. No evidence of mitral valve regurgitation. No evidence of mitral stenosis.  8. The tricuspid valve is normal in structure. Tricuspid valve regurgitation was not visualized by color flow Doppler.  9. The aortic valve is normal in structure. Aortic valve regurgitation was not visualized by color flow Doppler. Structurally normal aortic valve, with no evidence of sclerosis or stenosis. 10. The pulmonic valve was normal in structure. Pulmonic valve regurgitation is not visualized by color flow Doppler. 11. TR signal is inadequate for assessing pulmonary artery systolic pressure. 12. The inferior vena cava is normal in size with <50% respiratory variability, suggesting right atrial pressure of 8 mmHg.  Carotid/ABI Right Carotid: Velocities in the right ICA are consistent with a 40-59%                stenosis. The ECA appears >50% stenosed.  Left Carotid: Velocities in the left ICA are consistent with a 1-39% stenosis.               The ECA appears >50% stenosed. Vertebrals:  Bilateral vertebral arteries  demonstrate antegrade flow. Subclavians: Normal flow hemodynamics were seen in bilateral subclavian              arteries.  Right ABI: Resting right ankle-brachial index indicates noncompressible right lower extremity arteries. ABIs are unreliable. RT great toe pressure = 111 mmHg. PT and DP are noncompressible. Left ABI: Resting left ankle-brachial index indicates noncompressible left lower extremity arteries. ABIs are unreliable. LT Great toe pressure = 115 mmHg. PT is noncompressible. DP is unreliable. Right Upper Extremity: Doppler waveforms remain within normal limits with right radial compression. Doppler waveforms decrease >50% with right ulnar compression. Left Upper Extremity: Doppler waveforms remain within normal limits with left radial compression. Doppler waveforms decrease >50% with left ulnar compression.  Patient Profile  Bradley Soto is a 78 y.o. male with CKD stage IV, diabetes on insulin, sick sinus syndrome status post pacemaker implantation, paroxysmal atrial fibrillation, CAD status post multiple PCI's who was admitted on 10/14, and found to have severe in-stent restenosis with multivessel CAD.  He is currently awaiting bypass surgery on Monday.  Assessment & Plan  1.  Unstable angina/Chest pain/severe multivessel CAD/in-stent restenosis -Cardiac cath with severe triple-vessel disease and he is awaiting bypass on Monday.  Echocardiogram with normal EF.  He has completed all screening for his CABG. -N.p.o. at midnight for CABG tomorrow -Heparin will stop per CTS protocol -Continue home beta-blocker, statin, other medications.  2.  Paroxysmal atrial fibrillation on Eliquis at home -Continue home amiodarone -He is on heparin drip  3.  CKD stage IV -Creat stable, with baseline around 2.7-3.3 -Nephrology is following -He understands his risk of requiring hemodialysis post CABG  4.  Hypertension -Well-controlled on home medications  5.  Hyperlipidemia  -Well-controlled on home Zocor  6.  Sick sinus syndrome status post pacemaker implantation -Mainly AAI paced on review of telemetry no issues  7.  Diabetes -We will continue with 30 units twice daily of his 70/30  # FEN -Diet: Cardiac diet, npo at midnight  -No intravenous fluids -Daily BMP -DVT prophylaxis: Heparin drip -Code: Full   For questions  or updates, please contact [place] Please consult [email or web address] for contact info under        Signed, [place] Bells T. Audie Box, [place]  12/04/2018 11:30 AM

## 2018-12-05 ENCOUNTER — Inpatient Hospital Stay (HOSPITAL_COMMUNITY): Payer: Medicare Other

## 2018-12-05 ENCOUNTER — Inpatient Hospital Stay (HOSPITAL_COMMUNITY)
Admission: RE | Disposition: A | Discharge status: Home / Self Care | Payer: Self-pay | Source: Home / Self Care | Attending: Cardiothoracic Surgery

## 2018-12-05 ENCOUNTER — Inpatient Hospital Stay (HOSPITAL_COMMUNITY): Payer: Medicare Other | Admitting: Certified Registered Nurse Anesthetist

## 2018-12-05 DIAGNOSIS — I25110 Atherosclerotic heart disease of native coronary artery with unstable angina pectoris: Secondary | ICD-10-CM

## 2018-12-05 DIAGNOSIS — I2510 Atherosclerotic heart disease of native coronary artery without angina pectoris: Secondary | ICD-10-CM | POA: Diagnosis present

## 2018-12-05 HISTORY — PX: CLIPPING OF ATRIAL APPENDAGE: SHX5773

## 2018-12-05 HISTORY — PX: CORONARY ARTERY BYPASS GRAFT: SHX141

## 2018-12-05 HISTORY — PX: MAZE: SHX5063

## 2018-12-05 HISTORY — PX: TEE WITHOUT CARDIOVERSION: SHX5443

## 2018-12-05 LAB — URINALYSIS, ROUTINE W REFLEX MICROSCOPIC
Bacteria, UA: NONE SEEN
Bilirubin Urine: NEGATIVE
Glucose, UA: NEGATIVE mg/dL
Hgb urine dipstick: NEGATIVE
Ketones, ur: NEGATIVE mg/dL
Leukocytes,Ua: NEGATIVE
Nitrite: NEGATIVE
Protein, ur: 100 mg/dL — AB
Specific Gravity, Urine: 1.01 (ref 1.005–1.030)
pH: 5 (ref 5.0–8.0)

## 2018-12-05 LAB — POCT I-STAT 7, (LYTES, BLD GAS, ICA,H+H)
Acid-base deficit: 6 mmol/L — ABNORMAL HIGH (ref 0.0–2.0)
Acid-base deficit: 8 mmol/L — ABNORMAL HIGH (ref 0.0–2.0)
Acid-base deficit: 10 mmol/L — ABNORMAL HIGH (ref 0.0–2.0)
Acid-base deficit: 7 mmol/L — ABNORMAL HIGH (ref 0.0–2.0)
Acid-base deficit: 6 mmol/L — ABNORMAL HIGH (ref 0.0–2.0)
Acid-base deficit: 5 mmol/L — ABNORMAL HIGH (ref 0.0–2.0)
Bicarbonate: 21.1 mmol/L (ref 20.0–28.0)
Bicarbonate: 19.2 mmol/L — ABNORMAL LOW (ref 20.0–28.0)
Bicarbonate: 19 mmol/L — ABNORMAL LOW (ref 20.0–28.0)
Bicarbonate: 19.8 mmol/L — ABNORMAL LOW (ref 20.0–28.0)
Bicarbonate: 19.8 mmol/L — ABNORMAL LOW (ref 20.0–28.0)
Bicarbonate: 20.4 mmol/L (ref 20.0–28.0)
Calcium, Ion: 1.14 mmol/L — ABNORMAL LOW (ref 1.15–1.40)
Calcium, Ion: 1.1 mmol/L — ABNORMAL LOW (ref 1.15–1.40)
Calcium, Ion: 1.16 mmol/L (ref 1.15–1.40)
Calcium, Ion: 1.11 mmol/L — ABNORMAL LOW (ref 1.15–1.40)
Calcium, Ion: 1.08 mmol/L — ABNORMAL LOW (ref 1.15–1.40)
Calcium, Ion: 1.09 mmol/L — ABNORMAL LOW (ref 1.15–1.40)
HCT: 29 % — ABNORMAL LOW (ref 39.0–52.0)
HCT: 33 % — ABNORMAL LOW (ref 39.0–52.0)
HCT: 32 % — ABNORMAL LOW (ref 39.0–52.0)
HCT: 30 % — ABNORMAL LOW (ref 39.0–52.0)
HCT: 30 % — ABNORMAL LOW (ref 39.0–52.0)
HCT: 27 % — ABNORMAL LOW (ref 39.0–52.0)
Hemoglobin: 9.9 g/dL — ABNORMAL LOW (ref 13.0–17.0)
Hemoglobin: 11.2 g/dL — ABNORMAL LOW (ref 13.0–17.0)
Hemoglobin: 10.9 g/dL — ABNORMAL LOW (ref 13.0–17.0)
Hemoglobin: 10.2 g/dL — ABNORMAL LOW (ref 13.0–17.0)
Hemoglobin: 10.2 g/dL — ABNORMAL LOW (ref 13.0–17.0)
Hemoglobin: 9.2 g/dL — ABNORMAL LOW (ref 13.0–17.0)
O2 Saturation: 100 %
O2 Saturation: 92 %
O2 Saturation: 95 %
O2 Saturation: 96 %
O2 Saturation: 96 %
O2 Saturation: 92 %
Patient temperature: 36.3
Patient temperature: 36.2
Patient temperature: 36.2
Patient temperature: 36.4
Patient temperature: 36.4
Potassium: 4.4 mmol/L (ref 3.5–5.1)
Potassium: 4.3 mmol/L (ref 3.5–5.1)
Potassium: 4.5 mmol/L (ref 3.5–5.1)
Potassium: 4.4 mmol/L (ref 3.5–5.1)
Potassium: 4.3 mmol/L (ref 3.5–5.1)
Potassium: 4.5 mmol/L (ref 3.5–5.1)
Sodium: 142 mmol/L (ref 135–145)
Sodium: 142 mmol/L (ref 135–145)
Sodium: 142 mmol/L (ref 135–145)
Sodium: 144 mmol/L (ref 135–145)
Sodium: 146 mmol/L — ABNORMAL HIGH (ref 135–145)
Sodium: 146 mmol/L — ABNORMAL HIGH (ref 135–145)
TCO2: 22 mmol/L (ref 22–32)
TCO2: 20 mmol/L — ABNORMAL LOW (ref 22–32)
TCO2: 21 mmol/L — ABNORMAL LOW (ref 22–32)
TCO2: 21 mmol/L — ABNORMAL LOW (ref 22–32)
TCO2: 21 mmol/L — ABNORMAL LOW (ref 22–32)
TCO2: 22 mmol/L (ref 22–32)
pCO2 arterial: 46 mmHg (ref 32.0–48.0)
pCO2 arterial: 41.7 mmHg (ref 32.0–48.0)
pCO2 arterial: 51.7 mmHg — ABNORMAL HIGH (ref 32.0–48.0)
pCO2 arterial: 42.8 mmHg (ref 32.0–48.0)
pCO2 arterial: 37.4 mmHg (ref 32.0–48.0)
pCO2 arterial: 37 mmHg (ref 32.0–48.0)
pH, Arterial: 7.269 — ABNORMAL LOW (ref 7.350–7.450)
pH, Arterial: 7.267 — ABNORMAL LOW (ref 7.350–7.450)
pH, Arterial: 7.169 — CL (ref 7.350–7.450)
pH, Arterial: 7.268 — ABNORMAL LOW (ref 7.350–7.450)
pH, Arterial: 7.329 — ABNORMAL LOW (ref 7.350–7.450)
pH, Arterial: 7.346 — ABNORMAL LOW (ref 7.350–7.450)
pO2, Arterial: 390 mmHg — ABNORMAL HIGH (ref 83.0–108.0)
pO2, Arterial: 69 mmHg — ABNORMAL LOW (ref 83.0–108.0)
pO2, Arterial: 91 mmHg (ref 83.0–108.0)
pO2, Arterial: 87 mmHg (ref 83.0–108.0)
pO2, Arterial: 85 mmHg (ref 83.0–108.0)
pO2, Arterial: 64 mmHg — ABNORMAL LOW (ref 83.0–108.0)

## 2018-12-05 LAB — POCT I-STAT, CHEM 8
BUN: 39 mg/dL — ABNORMAL HIGH (ref 8–23)
BUN: 38 mg/dL — ABNORMAL HIGH (ref 8–23)
BUN: 42 mg/dL — ABNORMAL HIGH (ref 8–23)
BUN: 44 mg/dL — ABNORMAL HIGH (ref 8–23)
BUN: 45 mg/dL — ABNORMAL HIGH (ref 8–23)
Calcium, Ion: 1.27 mmol/L (ref 1.15–1.40)
Calcium, Ion: 1.31 mmol/L (ref 1.15–1.40)
Calcium, Ion: 1.11 mmol/L — ABNORMAL LOW (ref 1.15–1.40)
Calcium, Ion: 1.11 mmol/L — ABNORMAL LOW (ref 1.15–1.40)
Calcium, Ion: 1.08 mmol/L — ABNORMAL LOW (ref 1.15–1.40)
Chloride: 112 mmol/L — ABNORMAL HIGH (ref 98–111)
Chloride: 112 mmol/L — ABNORMAL HIGH (ref 98–111)
Chloride: 110 mmol/L (ref 98–111)
Chloride: 110 mmol/L (ref 98–111)
Chloride: 108 mmol/L (ref 98–111)
Creatinine, Ser: 2.9 mg/dL — ABNORMAL HIGH (ref 0.61–1.24)
Creatinine, Ser: 3 mg/dL — ABNORMAL HIGH (ref 0.61–1.24)
Creatinine, Ser: 2.7 mg/dL — ABNORMAL HIGH (ref 0.61–1.24)
Creatinine, Ser: 2.7 mg/dL — ABNORMAL HIGH (ref 0.61–1.24)
Creatinine, Ser: 2.5 mg/dL — ABNORMAL HIGH (ref 0.61–1.24)
Glucose, Bld: 101 mg/dL — ABNORMAL HIGH (ref 70–99)
Glucose, Bld: 102 mg/dL — ABNORMAL HIGH (ref 70–99)
Glucose, Bld: 115 mg/dL — ABNORMAL HIGH (ref 70–99)
Glucose, Bld: 155 mg/dL — ABNORMAL HIGH (ref 70–99)
Glucose, Bld: 173 mg/dL — ABNORMAL HIGH (ref 70–99)
HCT: 35 % — ABNORMAL LOW (ref 39.0–52.0)
HCT: 34 % — ABNORMAL LOW (ref 39.0–52.0)
HCT: 29 % — ABNORMAL LOW (ref 39.0–52.0)
HCT: 31 % — ABNORMAL LOW (ref 39.0–52.0)
HCT: 28 % — ABNORMAL LOW (ref 39.0–52.0)
Hemoglobin: 11.9 g/dL — ABNORMAL LOW (ref 13.0–17.0)
Hemoglobin: 11.6 g/dL — ABNORMAL LOW (ref 13.0–17.0)
Hemoglobin: 10.5 g/dL — ABNORMAL LOW (ref 13.0–17.0)
Hemoglobin: 9.9 g/dL — ABNORMAL LOW (ref 13.0–17.0)
Hemoglobin: 9.5 g/dL — ABNORMAL LOW (ref 13.0–17.0)
Potassium: 4.3 mmol/L (ref 3.5–5.1)
Potassium: 4.6 mmol/L (ref 3.5–5.1)
Potassium: 5.7 mmol/L — ABNORMAL HIGH (ref 3.5–5.1)
Potassium: 5.7 mmol/L — ABNORMAL HIGH (ref 3.5–5.1)
Potassium: 5.2 mmol/L — ABNORMAL HIGH (ref 3.5–5.1)
Sodium: 143 mmol/L (ref 135–145)
Sodium: 143 mmol/L (ref 135–145)
Sodium: 140 mmol/L (ref 135–145)
Sodium: 141 mmol/L (ref 135–145)
Sodium: 139 mmol/L (ref 135–145)
TCO2: 20 mmol/L — ABNORMAL LOW (ref 22–32)
TCO2: 20 mmol/L — ABNORMAL LOW (ref 22–32)
TCO2: 21 mmol/L — ABNORMAL LOW (ref 22–32)
TCO2: 22 mmol/L (ref 22–32)
TCO2: 21 mmol/L — ABNORMAL LOW (ref 22–32)

## 2018-12-05 LAB — ECHO INTRAOPERATIVE TEE
Height: 71.5 in
Weight: 3774.4 oz

## 2018-12-05 LAB — GLUCOSE, CAPILLARY
Glucose-Capillary: 116 mg/dL — ABNORMAL HIGH (ref 70–99)
Glucose-Capillary: 129 mg/dL — ABNORMAL HIGH (ref 70–99)
Glucose-Capillary: 163 mg/dL — ABNORMAL HIGH (ref 70–99)
Glucose-Capillary: 136 mg/dL — ABNORMAL HIGH (ref 70–99)

## 2018-12-05 LAB — BASIC METABOLIC PANEL
Anion gap: 15 (ref 5–15)
BUN: 39 mg/dL — ABNORMAL HIGH (ref 8–23)
CO2: 20 mmol/L — ABNORMAL LOW (ref 22–32)
Calcium: 7.7 mg/dL — ABNORMAL LOW (ref 8.9–10.3)
Chloride: 111 mmol/L (ref 98–111)
Creatinine, Ser: 3.03 mg/dL — ABNORMAL HIGH (ref 0.61–1.24)
GFR calc Af Amer: 22 mL/min — ABNORMAL LOW (ref >60)
GFR calc non Af Amer: 19 mL/min — ABNORMAL LOW (ref >60)
Glucose, Bld: 134 mg/dL — ABNORMAL HIGH (ref 70–99)
Potassium: 4.3 mmol/L (ref 3.5–5.1)
Sodium: 146 mmol/L — ABNORMAL HIGH (ref 135–145)

## 2018-12-05 LAB — TYPE AND SCREEN
ABO/RH(D): O POS
Antibody Screen: NEGATIVE

## 2018-12-05 LAB — HEMOGLOBIN AND HEMATOCRIT, BLOOD
HCT: 30.1 % — ABNORMAL LOW (ref 39.0–52.0)
Hemoglobin: 10.2 g/dL — ABNORMAL LOW (ref 13.0–17.0)

## 2018-12-05 LAB — BLOOD GAS, ARTERIAL
Acid-base deficit: 6.7 mmol/L — ABNORMAL HIGH (ref 0.0–2.0)
Bicarbonate: 18.1 mmol/L — ABNORMAL LOW (ref 20.0–28.0)
Delivery systems: POSITIVE
Drawn by: 418751
FIO2: 40
O2 Saturation: 96 %
Patient temperature: 98.6
pCO2 arterial: 35 mmHg (ref 32.0–48.0)
pH, Arterial: 7.333 — ABNORMAL LOW (ref 7.350–7.450)
pO2, Arterial: 85.3 mmHg (ref 83.0–108.0)

## 2018-12-05 LAB — CBC
HCT: 37.4 % — ABNORMAL LOW (ref 39.0–52.0)
HCT: 33.5 % — ABNORMAL LOW (ref 39.0–52.0)
HCT: 31.3 % — ABNORMAL LOW (ref 39.0–52.0)
Hemoglobin: 12.2 g/dL — ABNORMAL LOW (ref 13.0–17.0)
Hemoglobin: 11.1 g/dL — ABNORMAL LOW (ref 13.0–17.0)
Hemoglobin: 10.4 g/dL — ABNORMAL LOW (ref 13.0–17.0)
MCH: 31.2 pg (ref 26.0–34.0)
MCH: 31.6 pg (ref 26.0–34.0)
MCH: 31.6 pg (ref 26.0–34.0)
MCHC: 32.6 g/dL (ref 30.0–36.0)
MCHC: 33.1 g/dL (ref 30.0–36.0)
MCHC: 33.2 g/dL (ref 30.0–36.0)
MCV: 95.7 fL (ref 80.0–100.0)
MCV: 95.4 fL (ref 80.0–100.0)
MCV: 95.1 fL (ref 80.0–100.0)
Platelets: 124 10*3/uL — ABNORMAL LOW (ref 150–400)
Platelets: 116 10*3/uL — ABNORMAL LOW (ref 150–400)
Platelets: 115 10*3/uL — ABNORMAL LOW (ref 150–400)
RBC: 3.91 MIL/uL — ABNORMAL LOW (ref 4.22–5.81)
RBC: 3.51 MIL/uL — ABNORMAL LOW (ref 4.22–5.81)
RBC: 3.29 MIL/uL — ABNORMAL LOW (ref 4.22–5.81)
RDW: 14.8 % (ref 11.5–15.5)
RDW: 14.9 % (ref 11.5–15.5)
RDW: 14.9 % (ref 11.5–15.5)
WBC: 8.5 10*3/uL (ref 4.0–10.5)
WBC: 18.5 10*3/uL — ABNORMAL HIGH (ref 4.0–10.5)
WBC: 14.4 10*3/uL — ABNORMAL HIGH (ref 4.0–10.5)
nRBC: 0 % (ref 0.0–0.2)
nRBC: 0 % (ref 0.0–0.2)
nRBC: 0 % (ref 0.0–0.2)

## 2018-12-05 LAB — PROTIME-INR
INR: 1.2 (ref 0.8–1.2)
INR: 1.3 — ABNORMAL HIGH (ref 0.8–1.2)
Prothrombin Time: 14.6 seconds (ref 11.4–15.2)
Prothrombin Time: 16.2 seconds — ABNORMAL HIGH (ref 11.4–15.2)

## 2018-12-05 LAB — RENAL FUNCTION PANEL
Albumin: 3.1 g/dL — ABNORMAL LOW (ref 3.5–5.0)
Anion gap: 10 (ref 5–15)
BUN: 44 mg/dL — ABNORMAL HIGH (ref 8–23)
CO2: 18 mmol/L — ABNORMAL LOW (ref 22–32)
Calcium: 8.4 mg/dL — ABNORMAL LOW (ref 8.9–10.3)
Chloride: 114 mmol/L — ABNORMAL HIGH (ref 98–111)
Creatinine, Ser: 3.46 mg/dL — ABNORMAL HIGH (ref 0.61–1.24)
GFR calc Af Amer: 18 mL/min — ABNORMAL LOW (ref >60)
GFR calc non Af Amer: 16 mL/min — ABNORMAL LOW (ref >60)
Glucose, Bld: 80 mg/dL (ref 70–99)
Phosphorus: 5.4 mg/dL — ABNORMAL HIGH (ref 2.5–4.6)
Potassium: 4.3 mmol/L (ref 3.5–5.1)
Sodium: 142 mmol/L (ref 135–145)

## 2018-12-05 LAB — HEPARIN LEVEL (UNFRACTIONATED): Heparin Unfractionated: 0.46 IU/mL (ref 0.30–0.70)

## 2018-12-05 LAB — SURGICAL PCR SCREEN
MRSA, PCR: NEGATIVE
Staphylococcus aureus: NEGATIVE

## 2018-12-05 LAB — PLATELET COUNT: Platelets: 101 10*3/uL — ABNORMAL LOW (ref 150–400)

## 2018-12-05 LAB — ABO/RH: ABO/RH(D): O POS

## 2018-12-05 LAB — APTT
aPTT: 86 seconds — ABNORMAL HIGH (ref 24–36)
aPTT: 31 seconds (ref 24–36)

## 2018-12-05 LAB — MAGNESIUM: Magnesium: 2.1 mg/dL (ref 1.7–2.4)

## 2018-12-05 SURGERY — CORONARY ARTERY BYPASS GRAFTING (CABG)
Anesthesia: General | Site: Chest

## 2018-12-05 MED ORDER — PROPOFOL 10 MG/ML IV BOLUS
INTRAVENOUS | Status: DC | PRN
  Administered 2018-12-05: 40 mg via INTRAVENOUS

## 2018-12-05 MED ORDER — CHLORHEXIDINE GLUCONATE 0.12 % MT SOLN
15.0000 mL | OROMUCOSAL | Status: AC
  Administered 2018-12-05: 15 mL via OROMUCOSAL

## 2018-12-05 MED ORDER — MIDAZOLAM HCL (PF) 10 MG/2ML IJ SOLN
INTRAMUSCULAR | Status: AC
  Filled 2018-12-05: qty 2

## 2018-12-05 MED ORDER — SODIUM CHLORIDE 0.45 % IV SOLN
INTRAVENOUS | Status: DC | PRN
  Administered 2018-12-05: 14:00:00 via INTRAVENOUS

## 2018-12-05 MED ORDER — ACETAMINOPHEN 160 MG/5ML PO SOLN: 1000.0000 mg | Freq: Four times a day (QID) | ORAL | Status: AC

## 2018-12-05 MED ORDER — PHENYLEPHRINE HCL-NACL 20-0.9 MG/250ML-% IV SOLN: 0.0000 ug/min | INTRAVENOUS | Status: DC

## 2018-12-05 MED ORDER — MIDAZOLAM HCL 2 MG/2ML IJ SOLN: 2.0000 mg | INTRAMUSCULAR | Status: DC | PRN

## 2018-12-05 MED ORDER — LACTATED RINGERS IV SOLN
INTRAVENOUS | Status: DC | PRN
  Administered 2018-12-05 (×2): via INTRAVENOUS

## 2018-12-05 MED ORDER — SODIUM CHLORIDE 0.9% FLUSH
3.0000 mL | Freq: Two times a day (BID) | INTRAVENOUS | Status: DC
  Administered 2018-12-06 – 2018-12-09 (×4): 3 mL via INTRAVENOUS

## 2018-12-05 MED ORDER — DEXAMETHASONE SODIUM PHOSPHATE 10 MG/ML IJ SOLN
INTRAMUSCULAR | Status: AC
  Filled 2018-12-05: qty 1

## 2018-12-05 MED ORDER — ALBUMIN HUMAN 5 % IV SOLN
250.0000 mL | INTRAVENOUS | Status: AC | PRN
  Administered 2018-12-05: 12.5 g via INTRAVENOUS

## 2018-12-05 MED ORDER — SODIUM BICARBONATE 8.4 % IV SOLN
100.0000 meq | Freq: Once | INTRAVENOUS | Status: AC
  Administered 2018-12-05: 19:00:00 100 meq via INTRAVENOUS

## 2018-12-05 MED ORDER — SODIUM BICARBONATE 8.4 % IV SOLN
INTRAVENOUS | Status: AC
  Filled 2018-12-05: qty 50

## 2018-12-05 MED ORDER — SODIUM BICARBONATE 8.4 % IV SOLN
INTRAVENOUS | Status: AC
  Administered 2018-12-05: 100 meq via INTRAVENOUS
  Filled 2018-12-05: qty 100

## 2018-12-05 MED ORDER — HEMOSTATIC AGENTS (NO CHARGE) OPTIME
TOPICAL | Status: DC | PRN
  Administered 2018-12-05: 1 via TOPICAL

## 2018-12-05 MED ORDER — DIPHENHYDRAMINE HCL 50 MG/ML IJ SOLN
INTRAMUSCULAR | Status: AC
  Filled 2018-12-05: qty 1

## 2018-12-05 MED ORDER — HEMOSTATIC AGENTS (NO CHARGE) OPTIME
TOPICAL | Status: DC | PRN
  Administered 2018-12-05: 2 via TOPICAL

## 2018-12-05 MED ORDER — ASPIRIN EC 325 MG PO TBEC
325.0000 mg | DELAYED_RELEASE_TABLET | Freq: Every day | ORAL | Status: DC
  Administered 2018-12-06 – 2018-12-11 (×5): 325 mg via ORAL
  Filled 2018-12-05 (×6): qty 1

## 2018-12-05 MED ORDER — ALBUMIN HUMAN 5 % IV SOLN
INTRAVENOUS | Status: DC | PRN
  Administered 2018-12-05: 12:00:00 via INTRAVENOUS

## 2018-12-05 MED ORDER — VANCOMYCIN HCL IN DEXTROSE 1-5 GM/200ML-% IV SOLN
1000.0000 mg | Freq: Once | INTRAVENOUS | Status: AC
  Administered 2018-12-05: 1000 mg via INTRAVENOUS
  Filled 2018-12-05: qty 200

## 2018-12-05 MED ORDER — SODIUM CHLORIDE 0.9 % IV SOLN
20.0000 ug | INTRAVENOUS | Status: AC
  Administered 2018-12-05: 20 ug via INTRAVENOUS
  Filled 2018-12-05: qty 5

## 2018-12-05 MED ORDER — BISACODYL 10 MG RE SUPP: 10.0000 mg | Freq: Every day | RECTAL | Status: DC

## 2018-12-05 MED ORDER — DIPHENHYDRAMINE HCL 50 MG/ML IJ SOLN
INTRAMUSCULAR | Status: DC | PRN
  Administered 2018-12-05: 50 mg via INTRAVENOUS

## 2018-12-05 MED ORDER — VANCOMYCIN HCL 1000 MG IV SOLR
INTRAVENOUS | Status: DC | PRN
  Administered 2018-12-05: 11:00:00 1000 mL

## 2018-12-05 MED ORDER — ACETAMINOPHEN 160 MG/5ML PO SOLN: 650.0000 mg | Freq: Once | ORAL | Status: AC

## 2018-12-05 MED ORDER — HEPARIN SODIUM (PORCINE) 1000 UNIT/ML IJ SOLN
INTRAMUSCULAR | Status: DC | PRN
  Administered 2018-12-05: 33000 [IU] via INTRAVENOUS

## 2018-12-05 MED ORDER — PROTAMINE SULFATE 10 MG/ML IV SOLN
INTRAVENOUS | Status: AC
  Filled 2018-12-05: qty 25

## 2018-12-05 MED ORDER — INSULIN REGULAR(HUMAN) IN NACL 100-0.9 UT/100ML-% IV SOLN
INTRAVENOUS | Status: DC
  Administered 2018-12-06: 5.2 [IU]/h via INTRAVENOUS
  Filled 2018-12-05: qty 100

## 2018-12-05 MED ORDER — ROCURONIUM BROMIDE 10 MG/ML (PF) SYRINGE
PREFILLED_SYRINGE | INTRAVENOUS | Status: DC | PRN
  Administered 2018-12-05 (×3): 100 mg via INTRAVENOUS

## 2018-12-05 MED ORDER — DEXMEDETOMIDINE HCL IN NACL 400 MCG/100ML IV SOLN: 0.0000 ug/kg/h | INTRAVENOUS | Status: DC

## 2018-12-05 MED ORDER — CHLORHEXIDINE GLUCONATE 0.12% ORAL RINSE (MEDLINE KIT)
15.0000 mL | Freq: Two times a day (BID) | OROMUCOSAL | Status: DC
  Administered 2018-12-05: 15 mL via OROMUCOSAL

## 2018-12-05 MED ORDER — ROCURONIUM BROMIDE 10 MG/ML (PF) SYRINGE
PREFILLED_SYRINGE | INTRAVENOUS | Status: AC
  Filled 2018-12-05: qty 30

## 2018-12-05 MED ORDER — DEXMEDETOMIDINE HCL IN NACL 200 MCG/50ML IV SOLN
INTRAVENOUS | Status: AC
  Filled 2018-12-05: qty 50

## 2018-12-05 MED ORDER — FENTANYL CITRATE (PF) 250 MCG/5ML IJ SOLN
INTRAMUSCULAR | Status: AC
  Filled 2018-12-05: qty 30

## 2018-12-05 MED ORDER — SODIUM BICARBONATE 8.4 % IV SOLN
25.0000 meq | Freq: Once | INTRAVENOUS | Status: AC
  Administered 2018-12-05: 25 meq via INTRAVENOUS

## 2018-12-05 MED ORDER — ORAL CARE MOUTH RINSE
15.0000 mL | OROMUCOSAL | Status: DC
  Administered 2018-12-05 (×2): 15 mL via OROMUCOSAL

## 2018-12-05 MED ORDER — FENTANYL CITRATE (PF) 250 MCG/5ML IJ SOLN
INTRAMUSCULAR | Status: DC | PRN
  Administered 2018-12-05: 50 ug via INTRAVENOUS
  Administered 2018-12-05: 150 ug via INTRAVENOUS
  Administered 2018-12-05: 100 ug via INTRAVENOUS
  Administered 2018-12-05 (×2): 50 ug via INTRAVENOUS
  Administered 2018-12-05: 100 ug via INTRAVENOUS
  Administered 2018-12-05 (×2): 50 ug via INTRAVENOUS
  Administered 2018-12-05: 100 ug via INTRAVENOUS

## 2018-12-05 MED ORDER — ACETAMINOPHEN 650 MG RE SUPP
650.0000 mg | Freq: Once | RECTAL | Status: AC
  Administered 2018-12-05: 650 mg via RECTAL

## 2018-12-05 MED ORDER — METOPROLOL TARTRATE 5 MG/5ML IV SOLN
2.5000 mg | INTRAVENOUS | Status: DC | PRN
  Administered 2018-12-05 – 2018-12-06 (×2): 5 mg via INTRAVENOUS
  Filled 2018-12-05 (×2): qty 5

## 2018-12-05 MED ORDER — HEPARIN SODIUM (PORCINE) 1000 UNIT/ML IJ SOLN
INTRAMUSCULAR | Status: AC
  Filled 2018-12-05: qty 1

## 2018-12-05 MED ORDER — STERILE WATER FOR INJECTION IJ SOLN
INTRAMUSCULAR | Status: DC | PRN
  Administered 2018-12-05: 1000 mL via INTRACARDIAC

## 2018-12-05 MED ORDER — CHLORHEXIDINE GLUCONATE CLOTH 2 % EX PADS
6.0000 | MEDICATED_PAD | Freq: Once | CUTANEOUS | Status: AC
  Administered 2018-12-05: 6 via TOPICAL

## 2018-12-05 MED ORDER — MAGNESIUM SULFATE 4 GM/100ML IV SOLN: 4.0000 g | Freq: Once | INTRAVENOUS | Status: DC

## 2018-12-05 MED ORDER — VANCOMYCIN HCL 1.5 G IV SOLR
3000.0000 mg | INTRAVENOUS | Status: AC
  Administered 2018-12-05: 3000 mg
  Filled 2018-12-05: qty 3000

## 2018-12-05 MED ORDER — SODIUM CHLORIDE 0.9% FLUSH: 10.0000 mL | INTRAVENOUS | Status: DC | PRN

## 2018-12-05 MED ORDER — CHLORHEXIDINE GLUCONATE CLOTH 2 % EX PADS
6.0000 | MEDICATED_PAD | Freq: Every day | CUTANEOUS | Status: DC
  Administered 2018-12-05 – 2018-12-07 (×3): 6 via TOPICAL

## 2018-12-05 MED ORDER — SODIUM CHLORIDE 0.9% FLUSH
3.0000 mL | INTRAVENOUS | Status: DC | PRN
  Administered 2018-12-06 (×2): 3 mL via INTRAVENOUS
  Filled 2018-12-05 (×2): qty 3

## 2018-12-05 MED ORDER — TEMAZEPAM 15 MG PO CAPS: 15.0000 mg | ORAL_CAPSULE | Freq: Once | ORAL | Status: DC | PRN

## 2018-12-05 MED ORDER — OXYCODONE HCL 5 MG PO TABS
5.0000 mg | ORAL_TABLET | ORAL | Status: DC | PRN
  Administered 2018-12-06 – 2018-12-08 (×2): 5 mg via ORAL
  Filled 2018-12-05 (×3): qty 1

## 2018-12-05 MED ORDER — SODIUM CHLORIDE 0.9% FLUSH
10.0000 mL | Freq: Two times a day (BID) | INTRAVENOUS | Status: DC
  Administered 2018-12-05 – 2018-12-10 (×6): 10 mL

## 2018-12-05 MED ORDER — EPHEDRINE 5 MG/ML INJ
INTRAVENOUS | Status: AC
  Filled 2018-12-05: qty 10

## 2018-12-05 MED ORDER — NITROGLYCERIN IN D5W 200-5 MCG/ML-% IV SOLN: 0.0000 ug/min | INTRAVENOUS | Status: DC

## 2018-12-05 MED ORDER — ASPIRIN 81 MG PO CHEW
324.0000 mg | CHEWABLE_TABLET | Freq: Every day | ORAL | Status: DC
  Administered 2018-12-10: 324 mg
  Filled 2018-12-05: qty 4

## 2018-12-05 MED ORDER — MORPHINE SULFATE (PF) 2 MG/ML IV SOLN
1.0000 mg | INTRAVENOUS | Status: DC | PRN
  Administered 2018-12-05: 2 mg via INTRAVENOUS
  Administered 2018-12-06: 4 mg via INTRAVENOUS
  Administered 2018-12-06 – 2018-12-07 (×2): 2 mg via INTRAVENOUS
  Filled 2018-12-05: qty 1
  Filled 2018-12-05: qty 2
  Filled 2018-12-05 (×2): qty 1

## 2018-12-05 MED ORDER — CHLORHEXIDINE GLUCONATE CLOTH 2 % EX PADS: 6.0000 | MEDICATED_PAD | Freq: Once | CUTANEOUS | Status: DC

## 2018-12-05 MED ORDER — INDOCYANINE GREEN 25 MG IV SOLR
INTRAVENOUS | Status: DC | PRN
  Administered 2018-12-05: 12.5 mg via OPHTHALMIC

## 2018-12-05 MED ORDER — AMIODARONE HCL IN DEXTROSE 360-4.14 MG/200ML-% IV SOLN
30.0000 mg/h | INTRAVENOUS | Status: DC
  Administered 2018-12-05 – 2018-12-06 (×3): 60 mg/h via INTRAVENOUS
  Administered 2018-12-06 – 2018-12-07 (×2): 30 mg/h via INTRAVENOUS
  Filled 2018-12-05 (×5): qty 200

## 2018-12-05 MED ORDER — AMIODARONE HCL IN DEXTROSE 360-4.14 MG/200ML-% IV SOLN
60.0000 mg/h | INTRAVENOUS | Status: AC
  Administered 2018-12-05: 60 mg/h via INTRAVENOUS
  Filled 2018-12-05: qty 200

## 2018-12-05 MED ORDER — METOPROLOL TARTRATE 12.5 MG HALF TABLET
12.5000 mg | ORAL_TABLET | Freq: Once | ORAL | Status: AC
  Administered 2018-12-05: 12.5 mg via ORAL
  Filled 2018-12-05: qty 1

## 2018-12-05 MED ORDER — BISACODYL 5 MG PO TBEC
5.0000 mg | DELAYED_RELEASE_TABLET | Freq: Once | ORAL | Status: AC
  Administered 2018-12-05: 5 mg via ORAL
  Filled 2018-12-05: qty 1

## 2018-12-05 MED ORDER — CHLORHEXIDINE GLUCONATE 0.12 % MT SOLN
15.0000 mL | Freq: Once | OROMUCOSAL | Status: AC
  Administered 2018-12-05: 15 mL via OROMUCOSAL
  Filled 2018-12-05: qty 15

## 2018-12-05 MED ORDER — EPHEDRINE SULFATE-NACL 50-0.9 MG/10ML-% IV SOSY
PREFILLED_SYRINGE | INTRAVENOUS | Status: DC | PRN
  Administered 2018-12-05: 5 mg via INTRAVENOUS

## 2018-12-05 MED ORDER — 0.9 % SODIUM CHLORIDE (POUR BTL) OPTIME
TOPICAL | Status: DC | PRN
  Administered 2018-12-05: 5000 mL

## 2018-12-05 MED ORDER — STERILE WATER FOR INJECTION IJ SOLN
INTRAMUSCULAR | Status: DC | PRN
  Administered 2018-12-05 (×2): 10 mL via INTRACARDIAC

## 2018-12-05 MED ORDER — METOPROLOL TARTRATE 12.5 MG HALF TABLET
12.5000 mg | ORAL_TABLET | Freq: Two times a day (BID) | ORAL | Status: DC
  Administered 2018-12-06 – 2018-12-07 (×2): 12.5 mg via ORAL
  Filled 2018-12-05 (×5): qty 1

## 2018-12-05 MED ORDER — PROTAMINE SULFATE 10 MG/ML IV SOLN
INTRAVENOUS | Status: AC
  Filled 2018-12-05: qty 10

## 2018-12-05 MED ORDER — SODIUM CHLORIDE 0.9 % IV SOLN
1.5000 g | Freq: Two times a day (BID) | INTRAVENOUS | Status: AC
  Administered 2018-12-05 – 2018-12-06 (×2): 1.5 g via INTRAVENOUS
  Filled 2018-12-05 (×7): qty 1.5

## 2018-12-05 MED ORDER — BISACODYL 5 MG PO TBEC
10.0000 mg | DELAYED_RELEASE_TABLET | Freq: Every day | ORAL | Status: DC
  Administered 2018-12-06 – 2018-12-08 (×3): 10 mg via ORAL
  Filled 2018-12-05 (×5): qty 2

## 2018-12-05 MED ORDER — PROPOFOL 10 MG/ML IV BOLUS
INTRAVENOUS | Status: AC
  Filled 2018-12-05: qty 20

## 2018-12-05 MED ORDER — STERILE WATER FOR INJECTION IJ SOLN
INTRAMUSCULAR | Status: AC
  Filled 2018-12-05: qty 10

## 2018-12-05 MED ORDER — ARTIFICIAL TEARS OPHTHALMIC OINT
TOPICAL_OINTMENT | OPHTHALMIC | Status: DC | PRN
  Administered 2018-12-05: 1 via OPHTHALMIC

## 2018-12-05 MED ORDER — ACETAMINOPHEN 500 MG PO TABS
1000.0000 mg | ORAL_TABLET | Freq: Four times a day (QID) | ORAL | Status: AC
  Administered 2018-12-05 – 2018-12-10 (×17): 1000 mg via ORAL
  Filled 2018-12-05 (×17): qty 2

## 2018-12-05 MED ORDER — LACTATED RINGERS IV SOLN: 500.0000 mL | Freq: Once | INTRAVENOUS | Status: DC | PRN

## 2018-12-05 MED ORDER — ORAL CARE MOUTH RINSE
15.0000 mL | Freq: Two times a day (BID) | OROMUCOSAL | Status: DC
  Administered 2018-12-05 – 2018-12-10 (×9): 15 mL via OROMUCOSAL

## 2018-12-05 MED ORDER — SODIUM CHLORIDE 0.9 % IV SOLN: 250.0000 mL | INTRAVENOUS | Status: DC

## 2018-12-05 MED ORDER — LACTATED RINGERS IV SOLN: INTRAVENOUS | Status: DC

## 2018-12-05 MED ORDER — DOCUSATE SODIUM 100 MG PO CAPS
200.0000 mg | ORAL_CAPSULE | Freq: Every day | ORAL | Status: DC
  Administered 2018-12-06 – 2018-12-11 (×5): 200 mg via ORAL
  Filled 2018-12-05 (×6): qty 2

## 2018-12-05 MED ORDER — PHENYLEPHRINE 40 MCG/ML (10ML) SYRINGE FOR IV PUSH (FOR BLOOD PRESSURE SUPPORT)
PREFILLED_SYRINGE | INTRAVENOUS | Status: AC
  Filled 2018-12-05: qty 10

## 2018-12-05 MED ORDER — SODIUM CHLORIDE 0.9 % IV SOLN: INTRAVENOUS | Status: DC

## 2018-12-05 MED ORDER — TRAMADOL HCL 50 MG PO TABS
50.0000 mg | ORAL_TABLET | Freq: Two times a day (BID) | ORAL | Status: DC | PRN
  Administered 2018-12-08: 50 mg via ORAL
  Filled 2018-12-05: qty 1

## 2018-12-05 MED ORDER — ONDANSETRON HCL 4 MG/2ML IJ SOLN: 4.0000 mg | Freq: Four times a day (QID) | INTRAMUSCULAR | Status: DC | PRN

## 2018-12-05 MED ORDER — MIDAZOLAM HCL 5 MG/5ML IJ SOLN
INTRAMUSCULAR | Status: DC | PRN
  Administered 2018-12-05 (×2): 4 mg via INTRAVENOUS
  Administered 2018-12-05: 2 mg via INTRAVENOUS

## 2018-12-05 MED ORDER — TRANEXAMIC ACID-NACL 1000-0.7 MG/100ML-% IV SOLN
1000.0000 mg | INTRAVENOUS | Status: DC
  Filled 2018-12-05: qty 100

## 2018-12-05 MED ORDER — FAMOTIDINE IN NACL 20-0.9 MG/50ML-% IV SOLN
20.0000 mg | Freq: Two times a day (BID) | INTRAVENOUS | Status: AC
  Administered 2018-12-05 (×2): 20 mg via INTRAVENOUS
  Filled 2018-12-05: qty 50

## 2018-12-05 MED ORDER — PROTAMINE SULFATE 10 MG/ML IV SOLN
INTRAVENOUS | Status: DC | PRN
  Administered 2018-12-05: 280 mg via INTRAVENOUS

## 2018-12-05 MED ORDER — METOPROLOL TARTRATE 25 MG/10 ML ORAL SUSPENSION: 12.5000 mg | Freq: Two times a day (BID) | ORAL | Status: DC

## 2018-12-05 MED ORDER — SODIUM BICARBONATE 8.4 % IV SOLN
100.0000 meq | Freq: Once | INTRAVENOUS | Status: AC
  Administered 2018-12-05: 100 meq via INTRAVENOUS

## 2018-12-05 MED ORDER — DEXAMETHASONE SODIUM PHOSPHATE 10 MG/ML IJ SOLN
INTRAMUSCULAR | Status: DC | PRN
  Administered 2018-12-05: 10 mg via INTRAVENOUS

## 2018-12-05 MED ORDER — INSULIN REGULAR BOLUS VIA INFUSION
0.0000 [IU] | Freq: Three times a day (TID) | INTRAVENOUS | Status: DC
  Administered 2018-12-06: 5.9 [IU] via INTRAVENOUS
  Filled 2018-12-05: qty 10

## 2018-12-05 MED ORDER — PANTOPRAZOLE SODIUM 40 MG PO TBEC
40.0000 mg | DELAYED_RELEASE_TABLET | Freq: Every day | ORAL | Status: DC
  Administered 2018-12-07 – 2018-12-11 (×5): 40 mg via ORAL
  Filled 2018-12-05 (×5): qty 1

## 2018-12-05 MED ORDER — PLASMA-LYTE 148 IV SOLN
INTRAVENOUS | Status: DC | PRN
  Administered 2018-12-05: 500 mL via INTRAVASCULAR

## 2018-12-05 SURGICAL SUPPLY — 105 items
ADAPTER CARDIO PERF ANTE/RETRO (ADAPTER) ×8 IMPLANT
ATRICLIP EXCLUSION 40 STD HAND (Clip) ×4 IMPLANT
BAG DECANTER FOR FLEXI CONT (MISCELLANEOUS) ×4 IMPLANT
BASKET HEART (ORDER IN 25'S) (MISCELLANEOUS) ×1
BASKET HEART (ORDER IN 25S) (MISCELLANEOUS) ×3 IMPLANT
BLADE 11 SAFETY STRL DISP (BLADE) ×4 IMPLANT
BLADE CLIPPER SURG (BLADE) ×4 IMPLANT
BLADE STERNUM SYSTEM 6 (BLADE) ×4 IMPLANT
BNDG ELASTIC 4X5.8 VLCR STR LF (GAUZE/BANDAGES/DRESSINGS) ×4 IMPLANT
BNDG ELASTIC 6X5.8 VLCR STR LF (GAUZE/BANDAGES/DRESSINGS) ×4 IMPLANT
BNDG GAUZE ELAST 4 BULKY (GAUZE/BANDAGES/DRESSINGS) ×4 IMPLANT
CANISTER SUCT 3000ML PPV (MISCELLANEOUS) ×4 IMPLANT
CANNULA NON VENT 22FR 12 (CANNULA) ×4 IMPLANT
CATH CPB KIT HENDRICKSON (MISCELLANEOUS) ×4 IMPLANT
CATH HEART VENT LEFT (CATHETERS) ×3 IMPLANT
CATH RETROPLEGIA CORONARY 14FR (CATHETERS) ×4 IMPLANT
CATH ROBINSON RED A/P 18FR (CATHETERS) ×20 IMPLANT
CLAMP ISOLATOR SYNERGY LG (MISCELLANEOUS) ×4 IMPLANT
CLIP RETRACTION 3.0MM CORONARY (MISCELLANEOUS) ×8 IMPLANT
CONT SPEC 4OZ CLIKSEAL STRL BL (MISCELLANEOUS) ×4 IMPLANT
COVER WAND RF STERILE (DRAPES) ×4 IMPLANT
DERMABOND ADVANCED (GAUZE/BANDAGES/DRESSINGS) ×3
DERMABOND ADVANCED .7 DNX12 (GAUZE/BANDAGES/DRESSINGS) ×9 IMPLANT
DRAIN CHANNEL 28F RND 3/8 FF (WOUND CARE) ×12 IMPLANT
DRAPE CARDIOVASCULAR INCISE (DRAPES) ×1
DRAPE SLUSH/WARMER DISC (DRAPES) ×4 IMPLANT
DRAPE SRG 135X102X78XABS (DRAPES) ×3 IMPLANT
DRSG AQUACEL AG ADV 3.5X14 (GAUZE/BANDAGES/DRESSINGS) ×4 IMPLANT
ELECT BLADE 4.0 EZ CLEAN MEGAD (MISCELLANEOUS) ×4
ELECT CAUTERY BLADE 6.4 (BLADE) ×4 IMPLANT
ELECT REM PT RETURN 9FT ADLT (ELECTROSURGICAL) ×8
ELECTRODE BLDE 4.0 EZ CLN MEGD (MISCELLANEOUS) ×3 IMPLANT
ELECTRODE REM PT RTRN 9FT ADLT (ELECTROSURGICAL) ×6 IMPLANT
FELT TEFLON 1X6 (MISCELLANEOUS) ×8 IMPLANT
GAUZE SPONGE 4X4 12PLY STRL (GAUZE/BANDAGES/DRESSINGS) ×8 IMPLANT
GLOVE BIO SURGEON STRL SZ 6.5 (GLOVE) ×28 IMPLANT
GLOVE BIOGEL PI IND STRL 6 (GLOVE) ×9 IMPLANT
GLOVE BIOGEL PI INDICATOR 6 (GLOVE) ×3
GLOVE NEODERM STRL 7.5 LF PF (GLOVE) ×9 IMPLANT
GLOVE SURG NEODERM 7.5  LF PF (GLOVE) ×3
GOWN STRL REUS W/ TWL LRG LVL3 (GOWN DISPOSABLE) ×30 IMPLANT
GOWN STRL REUS W/TWL LRG LVL3 (GOWN DISPOSABLE) ×10
HEMOSTAT POWDER SURGIFOAM 1G (HEMOSTASIS) ×12 IMPLANT
HEMOSTAT SURGICEL 2X14 (HEMOSTASIS) ×4 IMPLANT
KIT BASIN OR (CUSTOM PROCEDURE TRAY) ×4 IMPLANT
KIT SUCTION CATH 14FR (SUCTIONS) ×4 IMPLANT
KIT TURNOVER KIT B (KITS) ×4 IMPLANT
KIT VASOVIEW HEMOPRO 2 VH 4000 (KITS) ×4 IMPLANT
LEAD PACING MYOCARDI (MISCELLANEOUS) ×4 IMPLANT
LINE VENT (MISCELLANEOUS) ×4 IMPLANT
MARKER GRAFT CORONARY BYPASS (MISCELLANEOUS) ×12 IMPLANT
NEEDLE 18GX1X1/2 (RX/OR ONLY) (NEEDLE) ×4 IMPLANT
NS IRRIG 1000ML POUR BTL (IV SOLUTION) ×20 IMPLANT
PACK E OPEN HEART (SUTURE) ×4 IMPLANT
PACK OPEN HEART (CUSTOM PROCEDURE TRAY) ×4 IMPLANT
PACK SPY-PHI (KITS) ×4 IMPLANT
PAD ARMBOARD 7.5X6 YLW CONV (MISCELLANEOUS) ×8 IMPLANT
PAD ELECT DEFIB RADIOL ZOLL (MISCELLANEOUS) ×4 IMPLANT
PENCIL BUTTON HOLSTER BLD 10FT (ELECTRODE) ×4 IMPLANT
POSITIONER HEAD DONUT 9IN (MISCELLANEOUS) ×4 IMPLANT
PUNCH AORTIC ROTATE  4.5MM 8IN (MISCELLANEOUS) ×4 IMPLANT
RELOAD ENDO GIA 45X2.5 (ENDOMECHANICALS) ×8 IMPLANT
SEALANT PATCH FIBRIN 2X4IN (MISCELLANEOUS) ×4 IMPLANT
SEALANT SURG COSEAL 4ML (VASCULAR PRODUCTS) ×4 IMPLANT
SET CARDIOPLEGIA MPS 5001102 (MISCELLANEOUS) ×4 IMPLANT
SUT BONE WAX W31G (SUTURE) ×4 IMPLANT
SUT ETHIBOND X763 2 0 SH 1 (SUTURE) ×8 IMPLANT
SUT MNCRL AB 3-0 PS2 18 (SUTURE) ×12 IMPLANT
SUT MNCRL AB 4-0 PS2 18 (SUTURE) ×4 IMPLANT
SUT PDS AB 1 CTX 36 (SUTURE) ×12 IMPLANT
SUT PROLENE 3 0 SH DA (SUTURE) ×4 IMPLANT
SUT PROLENE 4 0 RB 1 (SUTURE) ×2
SUT PROLENE 4 0 SH DA (SUTURE) ×12 IMPLANT
SUT PROLENE 4-0 RB1 .5 CRCL 36 (SUTURE) ×6 IMPLANT
SUT PROLENE 5 0 C 1 36 (SUTURE) IMPLANT
SUT PROLENE 6 0 C 1 30 (SUTURE) ×24 IMPLANT
SUT PROLENE 7 0 BV 1 (SUTURE) ×4 IMPLANT
SUT PROLENE 7 0 BV1 MDA (SUTURE) ×4 IMPLANT
SUT PROLENE 8 0 BV175 6 (SUTURE) IMPLANT
SUT PROLENE BLUE 7 0 (SUTURE) ×4 IMPLANT
SUT SILK 2 0 SH CR/8 (SUTURE) IMPLANT
SUT SILK 3 0 SH CR/8 (SUTURE) IMPLANT
SUT STEEL 6MS V (SUTURE) ×4 IMPLANT
SUT STEEL SZ 6 DBL 3X14 BALL (SUTURE) ×4 IMPLANT
SUT VIC AB 1 CTX 36 (SUTURE) ×1
SUT VIC AB 1 CTX36XBRD ANBCTR (SUTURE) ×3 IMPLANT
SUT VIC AB 2-0 CT1 27 (SUTURE) ×1
SUT VIC AB 2-0 CT1 TAPERPNT 27 (SUTURE) ×3 IMPLANT
SUT VIC AB 2-0 CTX 27 (SUTURE) IMPLANT
SUT VIC AB 3-0 X1 27 (SUTURE) IMPLANT
SYR 10ML LL (SYRINGE) IMPLANT
SYR 30ML LL (SYRINGE) ×4 IMPLANT
SYR 3ML LL SCALE MARK (SYRINGE) ×4 IMPLANT
SYSTEM SAHARA CHEST DRAIN ATS (WOUND CARE) ×4 IMPLANT
TAPE CLOTH SURG 4X10 WHT LF (GAUZE/BANDAGES/DRESSINGS) ×4 IMPLANT
TAPE PAPER 2X10 WHT MICROPORE (GAUZE/BANDAGES/DRESSINGS) ×4 IMPLANT
TOWEL GREEN STERILE (TOWEL DISPOSABLE) ×4 IMPLANT
TOWEL GREEN STERILE FF (TOWEL DISPOSABLE) ×4 IMPLANT
TRAY FOLEY SLVR 16FR TEMP STAT (SET/KITS/TRAYS/PACK) ×4 IMPLANT
TUBE CONNECTING 20X1/4 (TUBING) ×4 IMPLANT
TUBING LAP HI FLOW INSUFFLATIO (TUBING) ×4 IMPLANT
UNDERPAD 30X30 (UNDERPADS AND DIAPERS) ×4 IMPLANT
VENT LEFT HEART 12002 (CATHETERS) ×4
WATER STERILE IRR 1000ML POUR (IV SOLUTION) ×8 IMPLANT
WATER STERILE IRR 1000ML UROMA (IV SOLUTION) IMPLANT

## 2018-12-05 NOTE — Op Note (Signed)
CARDIOTHORACIC SURGERY OPERATIVE NOTE  Date of Procedure: 12/05/2018  Preoperative Diagnosis: Severe 3-vessel Coronary Artery Disease with progressive angina; atrial fibrillation  Postoperative Diagnosis: Same  Procedure:    Coronary Artery Bypass Grafting x 4  Free left Internal Mammary Artery to Distal Left Anterior Descending Coronary Artery; Saphenous Vein Graft to ramus intermedius and Obtuse Marginal Branch #3 of Left Circumflex Coronary Artery as sequential graft; Sapheonous Vein Graft to  Diagonal Branch Coronary Artery; Endoscopic Vein Harvest from right Thigh and Lower Leg; bilateral pulmonary vein isolation and left atrial appendage clipping; completion graft surveillance with indocyanine green fluorescence imaging (SPY)  Surgeon: B. Murvin Natal, MD  Assistant: Josie Saunders PA-C  Anesthesia: get  Operative Findings:  preserved left ventricular systolic function  Good quality left internal mammary artery conduit--taken as a free graft due to patient's renal insufficiency and potential need for HD  good quality saphenous vein conduit  Good  quality target vessels for grafting x Lad which was diffusely diseased    BRIEF CLINICAL NOTE AND INDICATIONS FOR SURGERY  78 yo man with 20+ year h/o CAD s/p PCI 20 years ago developed progressive angina with exertion as well as fatigue. Evaluation shows severe 3V CAD with high SYNTAX score. Now presenting for CABG and atrial ablation.    DETAILS OF THE OPERATIVE PROCEDURE  Preparation:  The patient is brought to the operating room on the above mentioned date and central monitoring was established by the anesthesia team including placement of Swan-Ganz catheter and radial arterial line. The patient is placed in the supine position on the operating table.  Intravenous antibiotics are administered. General endotracheal anesthesia is induced uneventfully. A Foley catheter is placed.  Baseline transesophageal echocardiogram was  performed.  Findings were notable for preserved LV function  The patient's chest, abdomen, both groins, and both lower extremities are prepared and draped in a sterile manner. A time out procedure is performed.   Surgical Approach and Conduit Harvest:  A median sternotomy incision was performed and the left internal mammary artery is dissected from the chest wall and prepared for bypass grafting. The left internal mammary artery is notably good quality conduit. Simultaneously, the greater saphenous vein is obtained from the patient's right thigh/lower leg using endoscopic vein harvest technique. The saphenous vein is notably good quality conduit. After removal of the saphenous vein, the small surgical incisions in the lower extremity are closed with absorbable suture. Following systemic heparinization, the left internal mammary artery was transected proximally and distally after noting excellent flow. The LIMA was taken free due to the patient's baseline renal insufficiency should the need for HD arise.   Extracorporeal Cardiopulmonary Bypass and Myocardial Protection:  The pericardium is opened. The ascending aorta is mildly dilated in appearance. The ascending aorta and the right atrium are cannulated for cardiopulmonary bypass.  Adequate heparinization is verified.   A retrograde cardioplegia cannula is placed through the right atrium into the coronary sinus.  The entire pre-bypass portion of the operation was notable for stable hemodynamics.  Cardiopulmonary bypass was begun and the surface of the heart is inspected. Distal target vessels are selected for coronary artery bypass grafting. A cardioplegia cannula is placed in the ascending aorta.  The right-sided pulmonary veins were encircled and the RFA clamp was applied to create right PVI.  The patient is allowed to cool passively to 34 C systemic temperature.  The aortic cross clamp is applied and cold blood cardioplegia is delivered  initially in an antegrade fashion through the  aortic root.  Supplemental cardioplegia is given retrograde through the coronary sinus catheter.  Iced saline slush is applied for topical hypothermia.  The initial cardioplegic arrest is rapid with early diastolic arrest.  Repeat doses of cardioplegia are administered intermittently throughout the entire cross clamp portion of the operation through the aortic root, through the coronary sinus catheter, and through subsequently placed vein grafts in order to maintain completely flat electrocardiogram.  Myocardial protection was felt to be  excellent.  Next, the ligament of Ruthann Cancer is divided with electrocautery and left-sided pulmonary vein isolation is performed; when this is completed, the left atrial appendage is opened and the RFA clamp is place inside the LAA and onto the left superior vein to create a dome lesion. The appendage is then clipped with a 40 mm Atriclip device.  Coronary Artery Bypass Grafting:   The  posterior descending branch of the right coronary artery was dissected, but was considered too small for grafting.  The high diagonal branch of the left anterior descending coronary artery was grafted using a reversed saphenous vein graft in an end-to-side fashion.  At the site of distal anastomosis the target vessel was good quality and measured approximately 1.5 mm in diameter. Anastomotic patency and runoff was confirmed with indocyanine green fluorescence imaging (SPY).  The  obtuse marginal 3 branch of the left circumflex coronary artery was grafted using a reversed saphenous vein graft in an end-to-side fashion.  At the site of distal anastomosis the target vessel was good quality and measured approximately 1.5 mm in diameter. Next, the same segment of graft was anastomosed to the Ramus intermedius as a sequenced graft. The ramus was 1.5 mm diameter and good quality. Anastomotic patency and runoff was confirmed with indocyanine green  fluorescence imaging (SPY).  The distal left anterior coronary artery was grafted with the left internal mammary artery in an end-to-side fashion.  At the site of distal anastomosis the target vessel was fair quality and measured approximately 1.5 mm in diameter. Anastomotic patency and runoff was confirmed with indocyanine green fluorescence imaging (SPY).  All proximal  anastomoses were placed directly to the ascending aorta prior to removal of the aortic cross clamp; the proximal LIMA anastomosis was done onto the hood of the diagonal vein graft.   The aortic cross clamp was removed after a total cross clamp time of 117 minutes.   Procedure Completion:  All proximal and distal coronary anastomoses were inspected for hemostasis and appropriate graft orientation. Epicardial pacing wires are fixed to the right ventricular outflow tract and to the right atrial appendage. The patient is rewarmed to 37C temperature. The patient is weaned and disconnected from cardiopulmonary bypass.  The patient's rhythm at separation from bypass was sinus bradycardia.  The patient was weaned from cardiopulmonary bypass with moderate inotropic support. Total cardiopulmonary bypass time for the operation was 153 minutes.  Followup transesophageal echocardiogram performed after separation from bypass revealedno changes from the preoperative exam.  The aortic and venous cannula were removed uneventfully. Protamine was administered to reverse the anticoagulation. The mediastinum and pleural space were inspected for hemostasis and irrigated with saline solution. The mediastinum and left pleural space were drained using fluted chest tubes placed through separate stab incisions inferiorly.  The soft tissues anterior to the aorta were reapproximated loosely. The sternum is closed with double strength sternal wire. The soft tissues anterior to the sternum were closed in multiple layers and the skin is closed with a running  subcuticular skin closure.  The  post-bypass portion of the operation was notable for stable rhythm and hemodynamics.  No blood products were administered during the operation.   Disposition:  The patient tolerated the procedure well and is transported to the surgical intensive care in stable condition. There are no intraoperative complications. All sponge instrument and needle counts are verified correct at completion of the operation.    Jayme Cloud, MD 12/05/2018 2:34 PM

## 2018-12-05 NOTE — Anesthesia Procedure Notes (Signed)
Central Venous Catheter Insertion Performed by: Belinda Block, MD, anesthesiologist Start/End10/19/2020 6:45 AM, 12/05/2018 7:00 AM Preanesthetic checklist: patient identified, IV checked, site marked, risks and benefits discussed, surgical consent, monitors and equipment checked, pre-op evaluation and timeout performed Position: Trendelenburg Lidocaine 1% used for infiltration and patient sedated Hand hygiene performed , maximum sterile barriers used  and Seldinger technique used PA cath was placed.Sheath introducer Procedure performed using ultrasound guided technique. Ultrasound Notes:anatomy identified Attempts: 1 Following insertion, line sutured. Post procedure assessment: blood return through all ports  Patient tolerated the procedure well with no immediate complications.

## 2018-12-05 NOTE — Anesthesia Procedure Notes (Signed)
Arterial Line Insertion Start/End10/19/2020 6:40 AM, 12/05/2018 7:00 AM Performed by: Lance Coon, CRNA, CRNA  Patient location: Pre-op. Preanesthetic checklist: patient identified, IV checked, site marked, risks and benefits discussed, surgical consent, monitors and equipment checked, pre-op evaluation, timeout performed and anesthesia consent Lidocaine 1% used for infiltration and patient sedated radial was placed Catheter size: 20 G Hand hygiene performed , maximum sterile barriers used  and Seldinger technique used  Attempts: 3 Procedure performed without using ultrasound guided technique. Following insertion, dressing applied and Biopatch. Post procedure assessment: normal and unchanged  Patient tolerated the procedure well with no immediate complications.

## 2018-12-05 NOTE — Progress Notes (Signed)
Subjective:  Underwent CABG times 4 this AM.  Presurgical crt was 3.4- UOP recorded through the case- in the last 2.5 hours around 240 ccs   Objective Vital signs in last 24 hours: Vitals:   12/04/18 1026 12/04/18 1221 12/04/18 1647 12/04/18 2146  BP: 136/67 132/73 139/69 131/64  Pulse: 62 62 64 (!) 59  Resp:    17  Temp:  97.6 F (36.4 C)  98.2 F (36.8 C)  TempSrc:  Oral  Oral  SpO2:  98%  93%  Weight:      Height:       Weight change:   Intake/Output Summary (Last 24 hours) at 12/05/2018 1237 Last data filed at 12/05/2018 1232 Gross per 24 hour  Intake 730 ml  Output 2200 ml  Net -1470 ml    Assessment/ Plan: Pt is a 78 y.o. yo male with HTN, DM and baseline stage 4 CKD (crt 2.8 to 3.3) who was admitted on 11/30/2018 with  [place] underwent cardiac cath showing severe multivessel CAD, now s/p CABG  Assessment/Plan: 1. Renal-  Stage 4 CKD at baseline- is at high risk for requiring RRT after surgery I would say.  Renal will be following UOP and labs closely.  Apparently he was told of his risk for needing RRT post surgery and was still willing to proceed.  K was 5.7 but down to 5.2 at last check - crt actually 2.5-  UOP has maintained - good signs so far  2. HTN/vol-  Currently on the appropriate cardioactive drips post CABG-  Adjustments per cards and CTS 3. Anemia- has dropped in the setting of above- follow closely and transfuse PRN-  Will add ESA at some point     [place]: Basic Metabolic Panel: Recent Labs  Lab 12/03/18 0446 12/04/18 0533 12/05/18 0323  12/05/18 1032 12/05/18 1129 12/05/18 1235  NA 140 143 142   < > 141 140 139  K 4.6 4.5 4.3   < > 5.7* 5.7* 5.2*  CL 114* 114* 114*   < > 110 110 108  CO2 19* 19* 18*  --   --   --   --   GLUCOSE 65* 101* 80   < > 115* 155* 173*  BUN 46* 40* 44*   < > 44* 42* 45*  CREATININE 3.18* 3.26* 3.46*   < > 2.70* 2.70* 2.50*  CALCIUM 8.4* 8.3* 8.4*  --   --   --   --   PHOS 4.6 4.9* 5.4*  --   --    --   --    < > = values in this interval not displayed.   Liver Function Tests: Recent Labs  Lab 12/03/18 0446 12/04/18 0533 12/05/18 0323  ALBUMIN 3.1* 3.0* 3.1*   No results for input(s): LIPASE, AMYLASE in the last 168 hours. No results for input(s): AMMONIA in the last 168 hours. CBC: Recent Labs  Lab 11/28/18 1448  12/01/18 0510 12/03/18 0446 12/04/18 0533 12/05/18 0323 12/05/18 1154  WBC 9.8   < > 7.9 8.2 7.7 8.5  --   HGB 14.5   < > 12.2* 12.1* 12.2* 12.2* 10.2*  HCT 44.2   < > 37.1* 37.3* 37.6* 37.4* 30.1*  MCV 95  --  94.9 95.6 96.2 95.7  --   PLT 176   < > 127* 130* 129* 124* 101*   < > = values in this interval not displayed.   Cardiac Enzymes: No results for  input(s): CKTOTAL, CKMB, CKMBINDEX, TROPONINI in the last 168 hours. CBG: Recent Labs  Lab 12/03/18 2147 12/04/18 0605 12/04/18 1150 12/04/18 1621 12/04/18 2148  GLUCAP 159* 102* 106* 186* 112*    Iron Studies: No results for input(s): IRON, TIBC, TRANSFERRIN, FERRITIN in the last 72 hours. Studies/Results: Dg Chest 2 View  Result Date: 12/05/2018 CLINICAL DATA:  Preop EXAM: CHEST - 2 VIEW COMPARISON:  November 24, 2018 FINDINGS: The heart size and mediastinal contours are within normal limits. A left-sided pacemaker seen with the lead tips in the right atrium and right ventricle. Both lungs are clear. The visualized skeletal structures are unremarkable. IMPRESSION: No active cardiopulmonary disease. Electronically Signed   By: Prudencio Pair M.D.   On: 12/05/2018 02:58   Medications: Infusions: . [MAR Hold] sodium chloride    . cefUROXime (ZINACEF)  IV    . desmopressin (DDAVP) IV    . DOPamine    . heparin 30,000 units/NS 1000 mL solution for CELLSAVER    . heparin 1,300 Units/hr (12/04/18 2224)  . milrinone    . nitroGLYCERIN    . norepinephrine (LEVOPHED) Adult infusion    . tranexamic acid      Scheduled Medications: . [MAR Hold] allopurinol  100 mg Oral BID  . [MAR Hold] amiodarone   200 mg Oral Daily  . [MAR Hold] amLODipine  2.5 mg Oral QHS  . Chlorhexidine Gluconate Cloth  6 each Topical Once  . [MAR Hold] furosemide  40 mg Oral Daily  . heparin-papaverine-plasmalyte irrigation   Irrigation To OR  . [MAR Hold] hydrALAZINE  25 mg Oral TID  . [MAR Hold] insulin aspart protamine- aspart  30 Units Subcutaneous BID WC  . [MAR Hold] isosorbide mononitrate  30 mg Oral Daily  . [place] Blood Cardioplegia vial (lidocaine/magnesium/mannitol 0.26g-4g-6.4g)   Intracoronary Once  . [MAR Hold] metoprolol succinate  100 mg Oral Daily  . potassium chloride  80 mEq Other To OR  . [MAR Hold] simvastatin  10 mg Oral QHS  . [MAR Hold] sodium chloride flush  3 mL Intravenous Q12H  . tranexamic acid  2 mg/kg Intracatheter To OR  . vancomycin 1000 mg in NS (1000 ml) irrigation for Dr. Roxy Manns case   Irrigation To OR    have reviewed scheduled and prn medications.  Physical Exam: General: post CABG-  Intubated in 2H-- multiple drips  Heart: RRR Lungs: on vent Abdomen: distended  Extremities: min edema     12/05/2018,12:37 PM  LOS: 5 days

## 2018-12-05 NOTE — Procedures (Signed)
Extubation Procedure Note Pt extubated following Rapid Wean. Follows all commands NIF -22, VC 1.2L, Cuff Leak + No stridor.   Patient Details:   Name: Bradley Soto DOB: 03/20/1940 MRN: [id number]   Airway Documentation:    Vent end date: (not recorded) Vent end time: (not recorded)   Evaluation  O2 sats: stable throughout Complications: No apparent complications Patient did tolerate procedure well. Bilateral Breath Sounds: Clear, Diminished   Yes  Marissa Nestle 12/05/2018, 8:48 PM

## 2018-12-05 NOTE — Anesthesia Preprocedure Evaluation (Addendum)
Anesthesia Evaluation  Patient identified by MRN, date of birth, ID band Patient awake    Reviewed: Allergy & Precautions, NPO status , Patient's Chart, lab work & pertinent test results  History of Anesthesia Complications Negative for: history of anesthetic complications  Airway Mallampati: II  TM Distance: >3 FB Neck ROM: Full    Dental  (+) Dental Advisory Given, Loose,    Pulmonary sleep apnea and Continuous Positive Airway Pressure Ventilation ,    Pulmonary exam normal        Cardiovascular hypertension, Pt. on medications + CAD, + Past MI and + Cardiac Stents  Normal cardiovascular exam+ pacemaker (for SSS)    '20 Carotid US - 40-59% right ICAS, 1-39% left ICAS  '20 TTE -  EF 60 to 65%. Left ventricular septal wall thickness was mildly increased. Mildly increased left ventricular posterior wall thickness. There is mildly increased left ventricular hypertrophy. Left ventricular diastolic Doppler parameters are consistent with impaired relaxation pattern of LV diastolic filling. Left atrial size was mildly dilated. Trivial pericardial effusion is present. No valvulopathy identified.   '20 Cath - Ramus lesion is 99% stenosed. Prox LAD to Mid LAD lesion is 70% stenosed. Dist Cx lesion is 60% stenosed. Mid RCA lesion is 95% stenosed. 3rd Mrg-1 lesion is 80% stenosed. 3rd Mrg-2 lesion is 99% stenosed. 1st LPL lesion is 99% stenosed.       Neuro/Psych negative neurological ROS  negative psych ROS   GI/Hepatic negative GI ROS, Neg liver ROS,   Endo/Other  diabetes, Type 2, Insulin Dependent Obesity   Renal/GU CRFRenal disease     Musculoskeletal negative musculoskeletal ROS (+)   Abdominal   Peds  Hematology  (+) anemia ,  Plt 124k   Anesthesia Other Findings   Reproductive/Obstetrics                            Anesthesia Physical Anesthesia Plan  ASA: IV  Anesthesia  Plan: General   Post-op Pain Management:    Induction: Intravenous  PONV Risk Score and Plan: 2 and Treatment may vary due to age or medical condition  Airway Management Planned: Oral ETT  Additional Equipment: CVP, PA Cath, Arterial line, TEE and Ultrasound Guidance Line Placement  Intra-op Plan:   Post-operative Plan: Post-operative intubation/ventilation  Informed Consent: I have reviewed the patients History and Physical, chart, labs and discussed the procedure including the risks, benefits and alternatives for the proposed anesthesia with the patient or authorized representative who has indicated his/her understanding and acceptance.     Dental advisory given  Plan Discussed with: CRNA and Anesthesiologist  Anesthesia Plan Comments:        Anesthesia Quick Evaluation

## 2018-12-05 NOTE — Transfer of Care (Signed)
Immediate Anesthesia Transfer of Care Note  Patient: Bradley Soto  Procedure(s) Performed: CORONARY ARTERY BYPASS GRAFTING (CABG) X FOUR USING LEFT INTERNAL MAMMARY ARTERY AND RIGHT SAPHENOUS VEIN GRAFTS (N/A Chest) MAZE (N/A ) TRANSESOPHAGEAL ECHOCARDIOGRAM (TEE) (N/A ) Clipping Of Atrial Appendage  Patient Location: ICU  Anesthesia Type:General  Level of Consciousness: patient cooperative and Patient remains intubated per anesthesia plan  Airway & Oxygen Therapy: Patient remains intubated per anesthesia plan and Patient placed on Ventilator (see vital sign flow sheet for setting)  Post-op Assessment: Report given to RN and Post -op Vital signs reviewed and stable  Post vital signs: Reviewed and stable  Last Vitals:  Vitals Value Taken Time  BP    Temp 36.3 C 12/05/18 1359  Pulse 80 12/05/18 1359  Resp 16 12/05/18 1359  SpO2 94 % 12/05/18 1359  Vitals shown include unvalidated device data.  Last Pain:  Vitals:   12/05/18 0000  TempSrc:   PainSc: Asleep      Patients Stated Pain Goal: 0 (11/15/55 4734)  Complications: No apparent anesthesia complications

## 2018-12-05 NOTE — Anesthesia Procedure Notes (Signed)
Procedure Name: Intubation Date/Time: 12/05/2018 7:48 AM Performed by: Lance Coon, CRNA Pre-anesthesia Checklist: Patient identified, Emergency Drugs available, Suction available, Patient being monitored and Timeout performed Patient Re-evaluated:Patient Re-evaluated prior to induction Oxygen Delivery Method: Circle system utilized Preoxygenation: Pre-oxygenation with 100% oxygen Induction Type: IV induction Ventilation: Mask ventilation without difficulty Laryngoscope Size: Miller and 3 Grade View: Grade I Tube type: Oral Tube size: 8.0 mm Number of attempts: 1 Airway Equipment and Method: Stylet Placement Confirmation: ETT inserted through vocal cords under direct vision,  positive ETCO2 and breath sounds checked- equal and bilateral Secured at: 22 cm Tube secured with: Tape Dental Injury: Teeth and Oropharynx as per pre-operative assessment

## 2018-12-05 NOTE — Progress Notes (Signed)
  Echocardiogram Echocardiogram Transesophageal has been performed.  Bradley Soto 12/05/2018, 8:52 AM

## 2018-12-05 NOTE — Progress Notes (Signed)
Patient ID: Bradley Soto, male   DOB: 01-03-1941, 79 y.o.   MRN: [id number] EVENING ROUNDS NOTE :     [address]       [place] [address]             [phone]                 Day of Surgery Procedure(s) (LRB): CORONARY ARTERY BYPASS GRAFTING (CABG) X FOUR USING LEFT INTERNAL MAMMARY ARTERY AND RIGHT SAPHENOUS VEIN GRAFTS (N/A) MAZE (N/A) TRANSESOPHAGEAL ECHOCARDIOGRAM (TEE) (N/A) Clipping Of Atrial Appendage  Total Length of Stay:  LOS: 5 days  BP 119/75   Pulse 86   Temp (!) 97.3 F (36.3 C)   Resp 12   Ht 5' 11.5" (1.816 m)   Wt 107 kg Comment: scale a  SpO2 97%   BMI 32.44 kg/m   .Intake/Output      10/18 0701 - 10/19 0700 10/19 0701 - 10/20 0700   P.O. 720    I.V. (mL/kg)  1000 (9.3)   Blood  723   IV Piggyback  250   Total Intake(mL/kg) 720 (6.7) 1973 (18.4)   Urine (mL/kg/hr) 1000 (0.4) 1430 (1.5)   Blood  755   Chest Tube  52   Total Output 1000 2237   Net -280 -264          . sodium chloride 20 mL/hr at 12/05/18 1350  . [START ON 12/06/2018] sodium chloride    . sodium chloride 20 mL/hr at 12/05/18 1350  . albumin human 12.5 g (12/05/18 1539)  . amiodarone 60 mg/hr (12/05/18 1510)  . cefUROXime (ZINACEF)  IV    . dexmedetomidine (PRECEDEX) IV infusion 0.7 mcg/kg/hr (12/05/18 1350)  . famotidine (PEPCID) IV 20 mg (12/05/18 1350)  . insulin 5.3 Units/hr (12/05/18 1350)  . lactated ringers    . lactated ringers    . lactated ringers 20 mL/hr at 12/05/18 1350  . nitroGLYCERIN 0 mcg/min (12/05/18 1350)  . phenylephrine (NEO-SYNEPHRINE) Adult infusion 30 mcg/min (12/05/18 1350)  . vancomycin       Lab Results  Component Value Date   WBC 8.5 12/05/2018   HGB 9.5 (L) 12/05/2018   HCT 28.0 (L) 12/05/2018   PLT 101 (L) 12/05/2018   GLUCOSE 173 (H) 12/05/2018   CHOL 85 12/02/2018   TRIG 145 12/02/2018   HDL 25 (L) 12/02/2018   LDLDIRECT 50.5 10/10/2013   LDLCALC 31 12/02/2018   ALT 46 (H) 04/01/2016   AST 31 04/01/2016   NA  139 12/05/2018   K 5.2 (H) 12/05/2018   CL 108 12/05/2018   CREATININE 2.50 (H) 12/05/2018   BUN 45 (H) 12/05/2018   CO2 18 (L) 12/05/2018   TSH 4.470 04/01/2016   INR 1.2 12/05/2018   HGBA1C 8.3 (H) 12/01/2018   Sedated on vent Not bleeding On neo and epi Cordarone  Drip, MAZE done AAI pacing , has internal pacer preop   Grace Isaac MD  Beeper [phone] Office [phone] 12/05/2018 4:10 PM

## 2018-12-05 NOTE — Progress Notes (Signed)
RT NOTE:  Pt switched to CPAP/PS. Follows all commands.

## 2018-12-05 NOTE — Progress Notes (Signed)
   Currently undergoing coronary bypass surgery.

## 2018-12-05 NOTE — Brief Op Note (Addendum)
11/30/2018 - 12/05/2018  12:16 PM  PATIENT:  Bradley Soto  78 y.o. male  PRE-OPERATIVE DIAGNOSIS:  1. CORONARY ARTERY DISEASE 2. PAROXYSMAL ATRIAL FIBRILLATION  POST-OPERATIVE DIAGNOSIS:  1. CORONARY ARTERY DISEASE 2. PAROXYSMAL ATRIAL FIBRILLATION  PROCEDURE:  TRANSESOPHAGEAL ECHOCARDIOGRAM (TEE), CORONARY ARTERY BYPASS GRAFTING (CABG) X 4 (FREE LIMA to MID LAD, SVG to DIAGONAL, SVG SEQUENTIALLY to OM and RAMUS INTERMEDIATE) USING FREE LEFT INTERNAL MAMMARY ARTERY AND RIGHT GREATER SAPHENOUS VEIN GRAFTS, LEFT SIDED MAZE, and LEFT ATRIAL CLIP (size 40)  SURGEON:  Surgeon(s) and Role:    Wonda Olds, MD - Primary  PHYSICIAN ASSISTANT: Lars Pinks PA-C  ASSISTANTS: Dineen Kid RNFA  ANESTHESIA:   general  EBL:  Per anesthesia and perfusion record  DRAINS: Chest tubes placed in the mediastinal and pleural spaces   COUNTS CORRECT:  YES  DICTATION: .Dragon Dictation  PLAN OF CARE: Admit to inpatient   PATIENT DISPOSITION:  ICU - intubated and hemodynamically stable.   Delay start of Pharmacological VTE agent (>24hrs) due to surgical blood loss or risk of bleeding: yes  BASELINE WEIGHT: 107 kg

## 2018-12-05 NOTE — Anesthesia Postprocedure Evaluation (Signed)
Anesthesia Post Note  Patient: Bradley Soto  Procedure(s) Performed: CORONARY ARTERY BYPASS GRAFTING (CABG) X FOUR USING LEFT INTERNAL MAMMARY ARTERY AND RIGHT SAPHENOUS VEIN GRAFTS (N/A Chest) MAZE (N/A ) TRANSESOPHAGEAL ECHOCARDIOGRAM (TEE) (N/A ) Clipping Of Atrial Appendage     Patient location during evaluation: ICU Anesthesia Type: General Level of consciousness: sedated and patient remains intubated per anesthesia plan Pain management: pain level controlled Vital Signs Assessment: post-procedure vital signs reviewed and stable Respiratory status: patient remains intubated per anesthesia plan Cardiovascular status: stable Postop Assessment: no apparent nausea or vomiting Anesthetic complications: no    Last Vitals:  Vitals:   12/04/18 1647 12/04/18 2146  BP: 139/69 131/64  Pulse: 64 (!) 59  Resp:  17  Temp:  36.8 C  SpO2:  93%    Last Pain:  Vitals:   12/05/18 0000  TempSrc:   PainSc: [place] [facility] Cung [place]

## 2018-12-05 NOTE — H&P (Signed)
History and Physical Interval Note:  12/05/2018 7:21 AM  Bradley Soto  has presented today for surgery, with the diagnosis of CAD.  The various methods of treatment have been discussed with the patient and family. After consideration of risks, benefits and other options for treatment, the patient has consented to  Procedure(s): CORONARY ARTERY BYPASS GRAFTING (CABG) (N/A) MAZE (N/A) TRANSESOPHAGEAL ECHOCARDIOGRAM (TEE) (N/A) as a surgical intervention.  The patient's history has been reviewed, patient examined, no change in status, stable for surgery.  I have reviewed the patient's chart and labs.  Questions were answered to the patient's satisfaction.     Wonda Olds, MD TCTS [phone]

## 2018-12-06 ENCOUNTER — Encounter (HOSPITAL_COMMUNITY): Payer: Self-pay | Admitting: Cardiothoracic Surgery

## 2018-12-06 ENCOUNTER — Inpatient Hospital Stay (HOSPITAL_COMMUNITY): Payer: Medicare Other

## 2018-12-06 LAB — GLUCOSE, CAPILLARY
Glucose-Capillary: 105 mg/dL — ABNORMAL HIGH (ref 70–99)
Glucose-Capillary: 109 mg/dL — ABNORMAL HIGH (ref 70–99)
Glucose-Capillary: 110 mg/dL — ABNORMAL HIGH (ref 70–99)
Glucose-Capillary: 114 mg/dL — ABNORMAL HIGH (ref 70–99)
Glucose-Capillary: 116 mg/dL — ABNORMAL HIGH (ref 70–99)
Glucose-Capillary: 117 mg/dL — ABNORMAL HIGH (ref 70–99)
Glucose-Capillary: 118 mg/dL — ABNORMAL HIGH (ref 70–99)
Glucose-Capillary: 118 mg/dL — ABNORMAL HIGH (ref 70–99)
Glucose-Capillary: 119 mg/dL — ABNORMAL HIGH (ref 70–99)
Glucose-Capillary: 120 mg/dL — ABNORMAL HIGH (ref 70–99)
Glucose-Capillary: 123 mg/dL — ABNORMAL HIGH (ref 70–99)
Glucose-Capillary: 123 mg/dL — ABNORMAL HIGH (ref 70–99)
Glucose-Capillary: 124 mg/dL — ABNORMAL HIGH (ref 70–99)
Glucose-Capillary: 130 mg/dL — ABNORMAL HIGH (ref 70–99)
Glucose-Capillary: 137 mg/dL — ABNORMAL HIGH (ref 70–99)
Glucose-Capillary: 141 mg/dL — ABNORMAL HIGH (ref 70–99)
Glucose-Capillary: 144 mg/dL — ABNORMAL HIGH (ref 70–99)
Glucose-Capillary: 151 mg/dL — ABNORMAL HIGH (ref 70–99)
Glucose-Capillary: 157 mg/dL — ABNORMAL HIGH (ref 70–99)
Glucose-Capillary: 159 mg/dL — ABNORMAL HIGH (ref 70–99)
Glucose-Capillary: 166 mg/dL — ABNORMAL HIGH (ref 70–99)
Glucose-Capillary: 168 mg/dL — ABNORMAL HIGH (ref 70–99)
Glucose-Capillary: 197 mg/dL — ABNORMAL HIGH (ref 70–99)
Glucose-Capillary: 197 mg/dL — ABNORMAL HIGH (ref 70–99)
Glucose-Capillary: 206 mg/dL — ABNORMAL HIGH (ref 70–99)
Glucose-Capillary: 96 mg/dL (ref 70–99)
Glucose-Capillary: 96 mg/dL (ref 70–99)

## 2018-12-06 LAB — PREPARE FRESH FROZEN PLASMA: Unit division: 0

## 2018-12-06 LAB — CBC
HCT: 29.7 % — ABNORMAL LOW (ref 39.0–52.0)
HCT: 29.2 % — ABNORMAL LOW (ref 39.0–52.0)
Hemoglobin: 9.7 g/dL — ABNORMAL LOW (ref 13.0–17.0)
Hemoglobin: 9.6 g/dL — ABNORMAL LOW (ref 13.0–17.0)
MCH: 31.3 pg (ref 26.0–34.0)
MCH: 31.7 pg (ref 26.0–34.0)
MCHC: 32.7 g/dL (ref 30.0–36.0)
MCHC: 32.9 g/dL (ref 30.0–36.0)
MCV: 95.8 fL (ref 80.0–100.0)
MCV: 96.4 fL (ref 80.0–100.0)
Platelets: 87 10*3/uL — ABNORMAL LOW (ref 150–400)
Platelets: 86 10*3/uL — ABNORMAL LOW (ref 150–400)
RBC: 3.1 MIL/uL — ABNORMAL LOW (ref 4.22–5.81)
RBC: 3.03 MIL/uL — ABNORMAL LOW (ref 4.22–5.81)
RDW: 15.1 % (ref 11.5–15.5)
RDW: 15.4 % (ref 11.5–15.5)
WBC: 10.5 10*3/uL (ref 4.0–10.5)
WBC: 13.5 10*3/uL — ABNORMAL HIGH (ref 4.0–10.5)
nRBC: 0 % (ref 0.0–0.2)
nRBC: 0 % (ref 0.0–0.2)

## 2018-12-06 LAB — BPAM FFP
Blood Product Expiration Date: 202010242359
ISSUE DATE / TIME: 202010191312
Unit Type and Rh: 6200

## 2018-12-06 LAB — BASIC METABOLIC PANEL
Anion gap: 11 (ref 5–15)
Anion gap: 11 (ref 5–15)
BUN: 39 mg/dL — ABNORMAL HIGH (ref 8–23)
BUN: 41 mg/dL — ABNORMAL HIGH (ref 8–23)
CO2: 22 mmol/L (ref 22–32)
CO2: 22 mmol/L (ref 22–32)
Calcium: 8 mg/dL — ABNORMAL LOW (ref 8.9–10.3)
Calcium: 7.9 mg/dL — ABNORMAL LOW (ref 8.9–10.3)
Chloride: 110 mmol/L (ref 98–111)
Chloride: 106 mmol/L (ref 98–111)
Creatinine, Ser: 2.87 mg/dL — ABNORMAL HIGH (ref 0.61–1.24)
Creatinine, Ser: 3.1 mg/dL — ABNORMAL HIGH (ref 0.61–1.24)
GFR calc Af Amer: 23 mL/min — ABNORMAL LOW (ref >60)
GFR calc Af Amer: 21 mL/min — ABNORMAL LOW (ref >60)
GFR calc non Af Amer: 20 mL/min — ABNORMAL LOW (ref >60)
GFR calc non Af Amer: 18 mL/min — ABNORMAL LOW (ref >60)
Glucose, Bld: 108 mg/dL — ABNORMAL HIGH (ref 70–99)
Glucose, Bld: 121 mg/dL — ABNORMAL HIGH (ref 70–99)
Potassium: 4.5 mmol/L (ref 3.5–5.1)
Potassium: 4.2 mmol/L (ref 3.5–5.1)
Sodium: 143 mmol/L (ref 135–145)
Sodium: 139 mmol/L (ref 135–145)

## 2018-12-06 LAB — POCT I-STAT 7, (LYTES, BLD GAS, ICA,H+H)
Acid-base deficit: 3 mmol/L — ABNORMAL HIGH (ref 0.0–2.0)
Bicarbonate: 22.1 mmol/L (ref 20.0–28.0)
Calcium, Ion: 1.14 mmol/L — ABNORMAL LOW (ref 1.15–1.40)
HCT: 27 % — ABNORMAL LOW (ref 39.0–52.0)
Hemoglobin: 9.2 g/dL — ABNORMAL LOW (ref 13.0–17.0)
O2 Saturation: 90 %
Patient temperature: 36.4
Potassium: 4.6 mmol/L (ref 3.5–5.1)
Sodium: 143 mmol/L (ref 135–145)
TCO2: 23 mmol/L (ref 22–32)
pCO2 arterial: 37.4 mmHg (ref 32.0–48.0)
pH, Arterial: 7.377 (ref 7.350–7.450)
pO2, Arterial: 58 mmHg — ABNORMAL LOW (ref 83.0–108.0)

## 2018-12-06 LAB — SURGICAL PATHOLOGY

## 2018-12-06 LAB — MAGNESIUM
Magnesium: 1.8 mg/dL (ref 1.7–2.4)
Magnesium: 1.8 mg/dL (ref 1.7–2.4)

## 2018-12-06 MED ORDER — INSULIN ASPART 100 UNIT/ML ~~LOC~~ SOLN: 1.0000 [IU] | SUBCUTANEOUS | Status: DC

## 2018-12-06 MED ORDER — ALBUMIN HUMAN 5 % IV SOLN
12.5000 g | INTRAVENOUS | Status: AC | PRN
  Administered 2018-12-06 (×2): 12.5 g via INTRAVENOUS
  Filled 2018-12-06 (×2): qty 250

## 2018-12-06 MED ORDER — INSULIN ASPART 100 UNIT/ML ~~LOC~~ SOLN
0.0000 [IU] | SUBCUTANEOUS | Status: DC
  Administered 2018-12-06 – 2018-12-07 (×5): 8 [IU] via SUBCUTANEOUS
  Administered 2018-12-07: 12 [IU] via SUBCUTANEOUS
  Administered 2018-12-07 – 2018-12-08 (×4): 4 [IU] via SUBCUTANEOUS
  Administered 2018-12-08: 2 [IU] via SUBCUTANEOUS
  Administered 2018-12-08: 4 [IU] via SUBCUTANEOUS
  Administered 2018-12-08 – 2018-12-09 (×4): 2 [IU] via SUBCUTANEOUS
  Administered 2018-12-09: 4 [IU] via SUBCUTANEOUS
  Administered 2018-12-09 – 2018-12-10 (×3): 2 [IU] via SUBCUTANEOUS
  Administered 2018-12-10: 4 [IU] via SUBCUTANEOUS
  Administered 2018-12-10: 2 [IU] via SUBCUTANEOUS
  Administered 2018-12-10 – 2018-12-11 (×2): 4 [IU] via SUBCUTANEOUS
  Administered 2018-12-11: 2 [IU] via SUBCUTANEOUS
  Administered 2018-12-11: 8 [IU] via SUBCUTANEOUS

## 2018-12-06 MED ORDER — FUROSEMIDE 10 MG/ML IJ SOLN
8.0000 mg/h | INTRAVENOUS | Status: DC
  Administered 2018-12-06 – 2018-12-08 (×2): 8 mg/h via INTRAVENOUS
  Filled 2018-12-06: qty 25
  Filled 2018-12-06: qty 21
  Filled 2018-12-06: qty 25

## 2018-12-06 MED ORDER — FUROSEMIDE 10 MG/ML IJ SOLN
80.0000 mg | Freq: Once | INTRAMUSCULAR | Status: AC
  Administered 2018-12-06: 80 mg via INTRAVENOUS
  Filled 2018-12-06: qty 8

## 2018-12-06 MED ORDER — ROSUVASTATIN CALCIUM 20 MG PO TABS
20.0000 mg | ORAL_TABLET | Freq: Every day | ORAL | Status: DC
  Administered 2018-12-06 – 2018-12-10 (×5): 20 mg via ORAL
  Filled 2018-12-06 (×5): qty 1

## 2018-12-06 MED ORDER — INSULIN DETEMIR 100 UNIT/ML ~~LOC~~ SOLN
10.0000 [IU] | Freq: Two times a day (BID) | SUBCUTANEOUS | Status: DC
  Filled 2018-12-06 (×2): qty 0.1

## 2018-12-06 MED ORDER — INSULIN DETEMIR 100 UNIT/ML ~~LOC~~ SOLN
10.0000 [IU] | Freq: Two times a day (BID) | SUBCUTANEOUS | Status: DC
  Administered 2018-12-06 – 2018-12-11 (×11): 10 [IU] via SUBCUTANEOUS
  Filled 2018-12-06 (×13): qty 0.1

## 2018-12-06 MED FILL — Potassium Chloride Inj 2 mEq/ML: INTRAVENOUS | Qty: 40 | Status: AC

## 2018-12-06 MED FILL — Lidocaine HCl Local Preservative Free (PF) Inj 2%: INTRAMUSCULAR | Qty: 15 | Status: AC

## 2018-12-06 MED FILL — Heparin Sodium (Porcine) Inj 1000 Unit/ML: INTRAMUSCULAR | Qty: 30 | Status: AC

## 2018-12-06 NOTE — Discharge Instructions (Signed)

## 2018-12-06 NOTE — Progress Notes (Signed)
1 Day Post-Op Procedure(s) (LRB): CORONARY ARTERY BYPASS GRAFTING (CABG) X FOUR USING LEFT INTERNAL MAMMARY ARTERY AND RIGHT SAPHENOUS VEIN GRAFTS (N/A) MAZE (N/A) TRANSESOPHAGEAL ECHOCARDIOGRAM (TEE) (N/A) Clipping Of Atrial Appendage Subjective: No complaints  Objective: Vital signs in last 24 hours: Temp:  [96.8 F (36 C)-97.9 F (36.6 C)] 97.9 F (36.6 C) (10/20 0830) Pulse Rate:  [59-86] 60 (10/20 0900) Cardiac Rhythm: Normal sinus rhythm;Bundle branch block;Atrial paced (10/20 0800) Resp:  [10-30] 14 (10/20 0900) BP: (97-139)/(52-120) 125/67 (10/20 0900) SpO2:  [92 %-100 %] 93 % (10/20 0900) Arterial Line BP: (99-279)/(-27-69) 130/52 (10/20 0845) FiO2 (%):  [40 %-50 %] 40 % (10/19 1915) Weight:  [104.7 kg] 104.7 kg (10/20 0400)  Hemodynamic parameters for last 24 hours: PAP: (26-43)/(14-27) 38/27 CVP:  [0 mmHg-14 mmHg] 0 mmHg CO:  [4.6 L/min-7 L/min] 5.7 L/min CI:  [2.1 L/min/m2-3.1 L/min/m2] 2.5 L/min/m2  Intake/Output from previous day: 10/19 0701 - 10/20 0700 In: 4070.9 [P.O.:120; I.V.:2577.9; Blood:723; IV [id number]:001] Out: 7494 [Urine:2420; Blood:755; Chest Tube:352] Intake/Output this shift: Total I/O In: 115.1 [I.V.:115.1] Out: 147 [Urine:97; Chest Tube:50]  General appearance: alert and cooperative Neurologic: intact Heart: regular rate and rhythm, S1, S2 normal, no murmur, click, rub or gallop Lungs: clear to auscultation bilaterally Abdomen: soft, non-tender; bowel sounds normal; no masses,  no organomegaly Extremities: edema 1+, well-perfused  Lab Results: Recent Labs    12/05/18 2004  12/06/18 0325 12/06/18 0728  WBC 14.4*  --  10.5  --   HGB 10.4*   < > 9.7* 9.2*  HCT 31.3*   < > 29.7* 27.0*  PLT 115*  --  87*  --    < > = values in this interval not displayed.   BMET:  Recent Labs    12/05/18 2004  12/06/18 0634 12/06/18 0728  NA 146*   < > 143 143  K 4.3   < > 4.5 4.6  CL 111  --  110  --   CO2 20*  --  22  --   GLUCOSE 134*   --  108*  --   BUN 39*  --  39*  --   CREATININE 3.03*  --  2.87*  --   CALCIUM 7.7*  --  8.0*  --    < > = values in this interval not displayed.    PT/INR:  Recent Labs    12/05/18 1405  LABPROT 16.2*  INR 1.3*   ABG    Component Value Date/Time   PHART 7.377 12/06/2018 0728   HCO3 22.1 12/06/2018 0728   TCO2 23 12/06/2018 0728   ACIDBASEDEF 3.0 (H) 12/06/2018 0728   O2SAT 90.0 12/06/2018 0728   CBG (last 3)  Recent Labs    12/06/18 0651 12/06/18 0755 12/06/18 0901  GLUCAP 96 110* 119*    Assessment/Plan: S/P Procedure(s) (LRB): CORONARY ARTERY BYPASS GRAFTING (CABG) X FOUR USING LEFT INTERNAL MAMMARY ARTERY AND RIGHT SAPHENOUS VEIN GRAFTS (N/A) MAZE (N/A) TRANSESOPHAGEAL ECHOCARDIOGRAM (TEE) (N/A) Clipping Of Atrial Appendage Mobilize Diabetes control remove PA catheter; remove arterial line; appreciate nephrology input   LOS: 6 days    Bradley Soto 12/06/2018

## 2018-12-06 NOTE — Discharge Summary (Signed)
Physician Discharge Summary       [address]       [place] [address]             [phone]    Patient ID: Bradley Soto MRN: [id number] DOB/AGE: [birth year] 78 y.o.  Admit date: 11/30/2018 Discharge date: 12/11/2018  Admission Diagnoses: 1.  Unstable angina ([place]) 2.  Coronary artery disease 3.  Paroxysmal atrial fibrillation ([place])  Discharge Diagnoses:  1. S/p CABG x 4 2. 3. History of Sick sinus syndrome ([place]) 4. History of HLD (hyperlipidemia) 5. History of OSA on CPAP 6. History of CKD (chronic kidney disease), stage IV ([place]) 7. History of HTN (hypertension)   Consults: cardiology, nephrology and electrophysiology  Procedure (s):  TRANSESOPHAGEAL ECHOCARDIOGRAM (TEE), CORONARY ARTERY BYPASS GRAFTING (CABG) X 4 (FREE LIMA to MID LAD, SVG to DIAGONAL, SVG SEQUENTIALLY to OM and RAMUS INTERMEDIATE) USING FREE LEFT INTERNAL MAMMARY ARTERY AND RIGHT GREATER SAPHENOUS VEIN GRAFTS, LEFT SIDED MAZE, and LEFT ATRIAL CLIP (size 40) by Dr. Orvan Seen on 12/05/2018.  History of Presenting Illness: Bradley Soto is a 78 yo obese white male with known history of CKD, DM on insulin, Sick Sinus Syndrome post pacemaker with history of PAF on Eliquis, and CAD with multiple PCI in the past, which he states was over 20 years ago.  The patient presented to the ED on 11/24/2018 with complaints of chest pain.  However, he left prior to being seen due to prolonged wait time in the ED.  The patient was evaluated by Dr. [name] on 11/28/2018 with complaints of chest pain that is located mid sternally.  It radiates to his throat.   The pain would occur randomly and was worse with movement.  It also occurs at night and woke patient from sleep.  He also gets chest pain walking to the mail box.  Pain would resolve with SL NTG.  It was felt the patient should undergo cardiac catheterization.  He was agreeable to proceed.  This was performed today and showed in-stent stenosis of previously  placed stents.  There was also severe stenosis in native coronary arteries.  It was felt being diabetic coronary bypass grafting could be indicated, however if felt not to be a candidate, multivessel PCI could be attempted.  Currently patient is chest pain free.  He admits to long standing diabetes which is fairly well controlled.  He doesn't know his most recent A1c as it hasn't been checked recently due to [place].  He states his chest pain episodes are sporadic and are not linked to anything specifically.  He admits to not being very active.  He really doesn't want to have bypass surgery performed.  He states he has bad kidneys and would really not want to require dialysis.  He states he feels better with having stents placed over coronary bypass grafting.  Pt seen and examined by Dr. Orvan Seen. Patient is a very pleasant gentleman with long h/o CAD, s/p PCI some 20+ years ago, DM and CRI. He notes consistently decreased energy level, increased fatigue with exertion, and more recently classic substernal CP for which he sought evaluation. LHC yesterday shows severe, multivessel CAD that is clearly best suited for CABG for complete revascularization, especially in a diabetic.  While his initial preference was towards repeat PCI, he is in agreement to undergo standard work-up for CABG which includes carotid duplex, CT chest, PFTs, and peripheral ultrasonography. Pre operative carotid duplex US showed no significant left internal carotid artery stenosis but  a 40-59% right internal carotid artery stenosis. Because of his CKD, nephrology was consulted pre op as either CABG or PCI exposes him to increase risk for HD. Patient with CKD stage 4/A3- baseline Cr 2.8-3.3. Per Dr. Orvan Seen, his risk for renal failure is 9.2% using the STS on-line risk calculator, his overall risk for ESRD is 40 % in 2 years and 82% in 5 years using kidney failure risk calculator. Potential risks, benefits, and complications of there surgery were  discussed with the patient. Patient willing to undergo CABG. He underwent a CABG x 4 on 12/05/2018.  Brief Hospital Course:  The patient was extubated the evening of surgery without difficulty. He remained afebrile and hemodynamically stable. He was AV paced initially. He was on an Amiodarone drip. Bradley Soto, a line, chest tubes, and foley were removed early in the post operative course. Lopressor was started and titrated accordingly. He was volume over loaded and diuresed initially on a Lasix drip. Of note, he has a history of CKD. His admitting creatinine was 2.76. His creatinine post op went as high 4.6.  He had ABL anemia. He did not require a post op transfusion. Last H and H was 9.6/29.7.  He was weaned off the insulin drip.   The patient's glucose remained well controlled.The patient's HGA1C pre op was 8.3. The patient was felt surgically stable for transfer from the ICU to [facility] for further convalescence on 10/23. He continues to progress with cardiac rehab. He/she was ambulating on room air. He/she has been tolerating a diet and has had a bowel movement. Epicardial pacing wires were removed on 10/24 Chest tube sutures will be removed the day of discharge. The patient is felt surgically stable for discharge today.   Latest Vital Signs: Blood pressure (!) 150/72, pulse (!) 58, temperature 98.4 F (36.9 C), temperature source Oral, resp. rate 20, height 5' 11.5" (1.816 m), weight 112.9 kg, SpO2 93 %.  Physical Exam:  General appearance: alert, cooperative and no distress Heart: regular rate and rhythm, S1, S2 normal, no murmur, click, rub or gallop Lungs: clear to auscultation bilaterally Abdomen: soft, non-tender; bowel sounds normal; no masses,  no organomegaly Extremities: extremities normal, atraumatic, no cyanosis or edema Wound: clean and dry  Discharge Condition: Stable and discharged to home with wife.  Recent laboratory studies:  Lab Results  Component Value Date   WBC 7.3  12/11/2018   HGB 9.6 (L) 12/11/2018   HCT 29.7 (L) 12/11/2018   MCV 95.5 12/11/2018   PLT 121 (L) 12/11/2018   Lab Results  Component Value Date   NA 142 12/11/2018   K 4.4 12/11/2018   CL 108 12/11/2018   CO2 23 12/11/2018   CREATININE 4.43 (H) 12/11/2018   GLUCOSE 140 (H) 12/11/2018      Diagnostic Studies: Dg Chest 2 View  Result Date: 12/10/2018 CLINICAL DATA:  CABG EXAM: CHEST - 2 VIEW COMPARISON:  12/09/2018 FINDINGS: Interval removal of a right neck vascular sheath. Otherwise unchanged examination with small bilateral pleural effusions. No new airspace opacity. Cardiomegaly status post median sternotomy with left chest multi lead pacer and atrial appendage clip. IMPRESSION: 1. Interval removal of a right neck vascular sheath. 2. Otherwise unchanged examination with small bilateral pleural effusions. No new airspace opacity. 3. Cardiomegaly status post median sternotomy with left chest multi lead pacer and atrial appendage clip. Electronically Signed   By: Eddie Candle M.D.   On: 12/10/2018 14:18   Dg Chest 2 View  Result Date: 12/05/2018  CLINICAL DATA:  Preop EXAM: CHEST - 2 VIEW COMPARISON:  November 24, 2018 FINDINGS: The heart size and mediastinal contours are within normal limits. A left-sided pacemaker seen with the lead tips in the right atrium and right ventricle. Both lungs are clear. The visualized skeletal structures are unremarkable. IMPRESSION: No active cardiopulmonary disease. Electronically Signed   By: Prudencio Pair M.D.   On: 12/05/2018 02:58   Dg Chest 2 View  Result Date: 11/24/2018 CLINICAL DATA:  Left-sided chest pain and shortness of breath and chest congestion. EXAM: CHEST - 2 VIEW COMPARISON:  03/10/2017 FINDINGS: Heart size and pulmonary vascularity are normal. Aortic atherosclerosis. Pacemaker in place. Pulmonary vascularity is within normal limits. No infiltrates or effusions. No acute bone abnormality. Coronary artery stents in place. IMPRESSION: 1. No  active cardiopulmonary disease. 2. Aortic atherosclerosis. Electronically Signed   By: Lorriane Shire M.D.   On: 11/24/2018 17:08   Ct Chest Wo Contrast  Result Date: 12/01/2018 CLINICAL DATA:  Pt needs CABG done, and is in process of deciding; no prior CT chest has been done. Elevated renal function CAD eval, new onset HF, Asx for ischemia, LVEF reduced, no prior CAD pre-op CABG please perform ASAP EXAM: CT CHEST WITHOUT CONTRAST TECHNIQUE: Multidetector CT imaging of the chest was performed following the standard protocol without IV contrast. COMPARISON:  Two-view chest on 11/24/2018 FINDINGS: Cardiovascular: LEFT-sided transvenous pacemaker leads to the RIGHT atrium and RIGHT ventricle. Heart size is normal. No pericardial effusion. There is dense atherosclerosis of LEFT and RIGHT coronary arteries. There is atherosclerotic calcification of the thoracic aorta not associated with aneurysm. Normal noncontrast appearance of the pulmonary arteries. Mediastinum/Nodes: Nodule within the LEFT lobe of the thyroid gland measures 2.1 centimeters. The esophagus has diffusely thickened wall throughout its course, consistent with esophagitis. No hiatal hernia or evidence for esophageal mass. No significant mediastinal adenopathy. Lungs/Pleura: There is dependent septal thickening consistent with mild edema. A 0.9 x 0.8 centimeter nodules identified in the anterior LEFT UPPER lobe, best seen on image 105 of series 4. A 3 millimeter nodule is identified in the LEFT UPPER lobe on image 50/4. A ground-glass opacity is identified in the LEFT lung apex measuring 1.3 x 1.9 centimeters. Small ground-glass opacity at the MEDIAL LEFT lung apex is 0.9 x 0.7 centimeters on image 33/4. Scarring and mild pleural thickening at the LEFT lung base. Scattered emphysematous changes. Upper Abdomen: No acute abnormality. Musculoskeletal: No chest wall mass or suspicious bone lesions identified. IMPRESSION: 1. Three-vessel coronary artery  disease. 2. Mild cardiomegaly. 3. Diffuse thickening of the wall of the esophagus, consistent with esophagitis. 4. Pulmonary nodules. Largest ground-glass opacity in the LEFT lung apex is 1.3 x 1.9 centimeters and warrants follow-up. Recommend follow-up CT of the chest in 3 months. 5. Left thyroid nodule measuring 2.1 cm. Consider further evaluation with thyroid ultrasound. If patient is clinically hyperthyroid, consider nuclear medicine thyroid uptake and scan. 6. Aortic Atherosclerosis (ICD10-I70.0) and Emphysema (ICD10-J43.9). Electronically Signed   By: Nolon Nations M.D.   On: 12/01/2018 09:41   Dg Chest Port 1 View  Result Date: 12/09/2018 CLINICAL DATA:  Open-heart surgery EXAM: PORTABLE CHEST 1 VIEW COMPARISON:  Yesterday FINDINGS: Stable dual-chamber ICD/pacer lead positioning. Stable cardiomegaly with CABG and left atrial clipping. Right IJ sheath in stable position. Vascular congestion with small left pleural effusion. No pneumothorax. IMPRESSION: Stable vascular congestion and small left effusion. Electronically Signed   By: Monte Fantasia M.D.   On: 12/09/2018 07:34   Dg Chest  Port 1 View  Result Date: 12/08/2018 CLINICAL DATA:  Status post cardiac surgery. EXAM: PORTABLE CHEST 1 VIEW COMPARISON:  December 07, 2018. FINDINGS: Stable cardiomegaly with central pulmonary vascular congestion. No pneumothorax is noted. Right internal jugular venous sheath is unchanged. Left-sided pacemaker is unchanged. Mild bibasilar atelectasis is noted. Bony thorax is unremarkable. IMPRESSION: Stable cardiomegaly with central pulmonary vascular congestion. Mild bibasilar subsegmental atelectasis. Electronically Signed   By: Marijo Conception M.D.   On: 12/08/2018 07:06   Dg Chest Port 1 View  Result Date: 12/07/2018 CLINICAL DATA:  Postop open heart surgery EXAM: PORTABLE CHEST 1 VIEW COMPARISON:  12/06/2018 FINDINGS: Swan-Ganz catheter removed. Right jugular sheath remains in place. Changes of open heart  surgery with dual lead pacemaker. Negative for pneumothorax. Left chest tube remains in place. Cardiac enlargement with vascular congestion. Negative for edema. Left lower lobe atelectasis and small left effusion unchanged. Right lung clear. IMPRESSION: Persistent left lower lobe atelectasis and small effusion unchanged. Mild vascular congestion.  No edema. Electronically Signed   By: Franchot Gallo M.D.   On: 12/07/2018 08:38   Dg Chest Port 1 View  Result Date: 12/06/2018 CLINICAL DATA:  Cardiac surgery.  Chest. EXAM: PORTABLE CHEST 1 VIEW COMPARISON:  12/05/2018. FINDINGS: Cardiac pacer stable position. Swan-Ganz catheter tip noted in the right pulmonary artery. Mediastinal drainage catheter and left chest tube in stable position. No pneumothorax. Prior CABG. Left atrial appendage clip in stable position. Stable cardiomegaly. Mild pulmonary venous congestion. Bibasilar atelectasis/infiltrates. Small left pleural effusion again noted. Carotid vascular calcification. IMPRESSION: 1. Cardiac pacer stable position. Swan-Ganz catheter tip noted in the right pulmonary artery. Mediastinal drainage catheter and left chest tube in stable position. No pneumothorax. 2. Prior CABG. Left atrial appendage clip in stable position. Stable cardiomegaly. Mild pulmonary venous congestion. 3. Bibasilar atelectasis/infiltrates again noted without interim change. Small left pleural effusion again noted without interim change. 4.  Carotid vascular disease. Electronically Signed   By: Marcello Moores  Register   On: 12/06/2018 06:44   Dg Chest Port 1 View  Result Date: 12/05/2018 CLINICAL DATA:  Post CABG EXAM: PORTABLE CHEST 1 VIEW COMPARISON:  Chest radiograph from earlier today. FINDINGS: Endotracheal tube tip is 4.5 cm above the carina. Enteric tube enters stomach with the tip not seen on this image. Right internal jugular Swan-Ganz catheter terminates over the right pulmonary artery. Stable configuration of 2 lead left subclavian  pacemaker. Intact sternotomy wires. Left atrial appendage clip overlies the left mediastinum. CABG clips overlie left mediastinum. Mediastinal drain terminates over left mediastinum. Left chest tube terminates over the left mid pleural space. Stable cardiomediastinal silhouette with mild cardiomegaly. No pneumothorax. No right pleural effusion. Blunting of the left costophrenic angle. Slightly low lung volumes with mild hazy parahilar lung opacities. IMPRESSION: 1. No pneumothorax.  Well-positioned support structures. 2. Slightly low lung volumes. Mild hazy parahilar lung opacities, favor combination of atelectasis and possibly mild pulmonary edema. Blunting of the left costophrenic angle, favor atelectasis. Electronically Signed   By: Ilona Sorrel M.D.   On: 12/05/2018 14:16   Vas US Doppler Pre Cabg  Result Date: 12/01/2018 PREOPERATIVE VASCULAR EVALUATION  Indications: Pre Op CABG. Performing Technologist: Lita Mains RVT  Examination Guidelines: A complete evaluation includes B-mode imaging, spectral Doppler, color Doppler, and power Doppler as needed of all accessible portions of each vessel. Bilateral testing is considered an integral part of a complete examination. Limited examinations for reoccurring indications may be performed as noted.  Right Carotid Findings: +----------+--------+--------+--------+--------------------------+-------------+  PSV cm/s EDV cm/s Stenosis Describe                   Comments       +----------+--------+--------+--------+--------------------------+-------------+  CCA Prox   89       11                                                          +----------+--------+--------+--------+--------------------------+-------------+  CCA Distal 78       14                diffuse and hyperechoic                   +----------+--------+--------+--------+--------------------------+-------------+  ICA Prox   194      38       40-59%   heterogenous, irregular    40-59% by PSV                                          and smooth                                +----------+--------+--------+--------+--------------------------+-------------+  ICA Distal 95       28                                                          +----------+--------+--------+--------+--------------------------+-------------+  ECA        213      17       >50%                                               +----------+--------+--------+--------+--------------------------+-------------+ Portions of this table do not appear on this page. +----------+--------+-------+----------------+------------+             PSV cm/s EDV cms Describe         Arm Pressure  +----------+--------+-------+----------------+------------+  Subclavian 116              Multiphasic, WNL 137           +----------+--------+-------+----------------+------------+ +---------+--------+--+--------+--+---------+  Vertebral PSV cm/s 68 EDV cm/s 15 Antegrade  +---------+--------+--+--------+--+---------+ Left Carotid Findings: +----------+--------+--------+--------+-------------------------------+--------+             PSV cm/s EDV cm/s Stenosis Describe                        Comments  +----------+--------+--------+--------+-------------------------------+--------+  CCA Prox   105      17                                                          +----------+--------+--------+--------+-------------------------------+--------+  CCA Distal 111  14                                                          +----------+--------+--------+--------+-------------------------------+--------+  ICA Prox   136      32                heterogenous, irregular,                                                         diffuse and calcific                      +----------+--------+--------+--------+-------------------------------+--------+  ICA Mid    129      35                                                           +----------+--------+--------+--------+-------------------------------+--------+  ICA Distal 118      36                                                          +----------+--------+--------+--------+-------------------------------+--------+  ECA        243      17       >50%     hyperechoic, heterogenous and                                                    irregular                                 +----------+--------+--------+--------+-------------------------------+--------+ +----------+--------+--------+----------------+------------+  Subclavian PSV cm/s EDV cm/s Describe         Arm Pressure  +----------+--------+--------+----------------+------------+             188               Multiphasic, WNL 139           +----------+--------+--------+----------------+------------+ +---------+--------+--+--------+--+---------+  Vertebral PSV cm/s 66 EDV cm/s 15 Antegrade  +---------+--------+--+--------+--+---------+  ABI Findings: +---------+------------------+-----+---------+--------+  Right     Rt Pressure (mmHg) Index Waveform  Comment   +---------+------------------+-----+---------+--------+  Brachial  137                      triphasic           +---------+------------------+-----+---------+--------+  PTA       255                1.83  biphasic            +---------+------------------+-----+---------+--------+  DP  255                1.83  biphasic            +---------+------------------+-----+---------+--------+  Great Toe 111                0.80  Normal              +---------+------------------+-----+---------+--------+ +---------+------------------+-----+---------+-------+  Left      Lt Pressure (mmHg) Index Waveform  Comment  +---------+------------------+-----+---------+-------+  Brachial  139                      triphasic          +---------+------------------+-----+---------+-------+  PTA       255                1.83  triphasic          +---------+------------------+-----+---------+-------+  DP         204                1.47  triphasic          +---------+------------------+-----+---------+-------+  Great Toe 115                0.83  Normal             +---------+------------------+-----+---------+-------+  Right Doppler Findings: +--------+--------+-----+---------+--------+  Site     Pressure Index Doppler   Comments  +--------+--------+-----+---------+--------+  Brachial 137            triphasic           +--------+--------+-----+---------+--------+  Radial                  triphasic           +--------+--------+-----+---------+--------+  Ulnar                   triphasic           +--------+--------+-----+---------+--------+  Left Doppler Findings: +--------+--------+-----+---------+--------+  Site     Pressure Index Doppler   Comments  +--------+--------+-----+---------+--------+  Brachial 139            triphasic           +--------+--------+-----+---------+--------+  Radial                  triphasic           +--------+--------+-----+---------+--------+  Ulnar                   triphasic           +--------+--------+-----+---------+--------+  Summary: Right Carotid: Velocities in the right ICA are consistent with a 40-59%                stenosis. The ECA appears >50% stenosed. Left Carotid: Velocities in the left ICA are consistent with a 1-39% stenosis.               The ECA appears >50% stenosed. Vertebrals:  Bilateral vertebral arteries demonstrate antegrade flow. Subclavians: Normal flow hemodynamics were seen in bilateral subclavian              arteries. Right ABI: Resting right ankle-brachial index indicates noncompressible right lower extremity arteries. ABIs are unreliable. RT great toe pressure = 111 mmHg. PT and DP are noncompressible. Left ABI: Resting left ankle-brachial index indicates noncompressible left lower extremity arteries. ABIs are unreliable. LT Great toe pressure = 115 mmHg. PT is noncompressible. DP is unreliable.  Right Upper Extremity: Doppler waveforms remain within normal  limits with right radial compression. Doppler waveforms decrease >50% with right ulnar compression. Left Upper Extremity: Doppler waveforms remain within normal limits with left radial compression. Doppler waveforms decrease >50% with left ulnar compression.  Electronically signed by Servando Snare MD on 12/01/2018 at 3:42:52 PM.    Final        Discharge Instructions    Amb Referral to Cardiac Rehabilitation   Complete by: As directed    Diagnosis:  CABG Other     CABG X ___: 4 Comment - L atrial clip, MAZE   After initial evaluation and assessments completed: Virtual Based Care may be provided alone or in conjunction with Phase 2 Cardiac Rehab based on patient barriers.: Yes      Discharge Medications: Allergies as of 12/11/2018      Reactions   Shellfish Allergy Rash      Medication List    STOP taking these medications   amLODipine 2.5 MG tablet Commonly known as: NORVASC   diltiazem 60 MG tablet Commonly known as: CARDIZEM   Eliquis 5 MG Tabs tablet Generic drug: apixaban   hydrALAZINE 25 MG tablet Commonly known as: APRESOLINE   isosorbide mononitrate 30 MG 24 hr tablet Commonly known as: IMDUR   metoprolol succinate 100 MG 24 hr tablet Commonly known as: TOPROL-XL   nitroGLYCERIN 0.4 MG SL tablet Commonly known as: NITROSTAT   SIMILASAN DRY EYE RELIEF OP   simvastatin 10 MG tablet Commonly known as: ZOCOR   Tylenol PM Extra Strength 25-500 MG Tabs tablet Generic drug: diphenhydramine-acetaminophen   TYLENOL SINUS+HEADACHE PO     TAKE these medications   allopurinol 100 MG tablet Commonly known as: ZYLOPRIM Take 100 mg by mouth 2 (two) times daily.   amiodarone 200 MG tablet Commonly known as: PACERONE Take 1 tablet (200 mg total) by mouth 2 (two) times daily. What changed: when to take this   aspirin 325 MG EC tablet Take 1 tablet (325 mg total) by mouth daily. Start taking on: December 12, 2018   furosemide 40 MG tablet Commonly known as:  LASIX Take 40 mg by mouth daily.   metoprolol tartrate 25 MG tablet Commonly known as: LOPRESSOR Take 1 tablet (25 mg total) by mouth 3 (three) times daily.   NovoLOG Mix 70/30 FlexPen (70-30) 100 UNIT/ML FlexPen Generic drug: insulin aspart protamine - aspart Inject 35 Units into the skin 2 (two) times daily with a meal.   oxyCODONE 5 MG immediate release tablet Commonly known as: Oxy IR/ROXICODONE Take 1 tablet (5 mg total) by mouth every 6 (six) hours as needed for severe pain.   rosuvastatin 20 MG tablet Commonly known as: CRESTOR Take 1 tablet (20 mg total) by mouth daily at 6 PM.   Vitamin D-3 25 MCG (1000 UT) Caps Take 1,000 Units by mouth 2 (two) times daily.      The patient has been discharged on:   1.Beta Blocker:  Yes [ x  ]                              No   [   ]                              If No, reason:  2.Ace Inhibitor/ARB: Yes [   ]  No  [ x   ]                                     If No, reason: CKD  3.Statin:   Yes [ x  ]                  No  [   ]                  If No, reason:  4.Ecasa:  Yes  [  x ]                  No   [   ]                  If No, reason: Follow Up Appointments: Follow-up Information    Wonda Olds, MD. Go on 12/16/2018.   Specialty: Cardiothoracic Surgery Why: Appointment time is at 12:00 pm Contact information: [address] [place] [address] [phone]        [name], MD. Call.   Specialty: Internal Medicine Why: for a follow up appointment regarding further diabetes management and surveillance of HGA1C 8.3 Contact information: [address] [place] [address] [phone]        Leanor Kail, [place]. Go on 12/22/2018.   Specialty: Cardiology Why: Appointment time is at 3:00 pm Contact information: [address] [place] [address] [phone]        Shirley Friar, PA-C Follow up on 01/23/2019.     Specialty: Physician Assistant Why: 11:45 AM Contact information: [place] [address] [phone]           Signed: Terance Hart ContePA-C 12/11/2018, 11:50 AM

## 2018-12-06 NOTE — Progress Notes (Addendum)
Subjective: Successfully extubated yesterday evening. Urine output maintaining at 60 ml/hr. This morning he is feeling well and looks forward to eating later today. Denies any chest pain, other than soreness at surgical site. crt down from last evening  Objective Vital signs in last 24 hours: Vitals:   12/06/18 0500 12/06/18 0600 12/06/18 0615 12/06/18 0700  BP: 120/74 131/73  122/73  Pulse: 86 85 85 86  Resp: 11 17 19 14   Temp: (!) 96.8 F (36 C) (!) 97 F (36.1 C) (!) 97.2 F (36.2 C) (!) 97.3 F (36.3 C)  TempSrc:      SpO2: 98% 100% 99% 97%  Weight:      Height:       Weight change:   Intake/Output Summary (Last 24 hours) at 12/06/2018 0751 Last data filed at 12/06/2018 0700 Gross per 24 hour  Intake 4070.89 ml  Output 3527 ml  Net 543.89 ml    Assessment/ Plan: Pt is a 78 y.o. yo male with HTN, DM and baseline stage 4 CKD (crt 2.8 to 3.3) who was admitted on 11/30/2018 with [place] underwent cardiac cath showing severe multivessel CAD, now s/p CABG on 10/19.  Assessment/Plan: 1. Renal-  Stage 4 CKD at baseline- high risk for requiring RRT postoperatively. Maintaining UOP and crt remains at baseline so far. Elytes stable. Renal will continue following labs and UOP closely.   2. HTN/vol-  Blood pressure stable off pressors 3. Anemia- Hgb stable this morning; follow closely and transfuse PRN;  Will likely add ESA at some point.   Delice Bison  PGY-2   Patient seen and examined, agree with above note with above modifications. Patient is doing amazingly well not even 24 hours out from CABG.  From my standpoint, is making urine and crt is actually down from last night.  No indications for any intervention from [place].  Will continue to watch closely  Corliss Parish, MD 12/06/2018     Labs: Basic Metabolic Panel: Recent Labs  Lab 12/03/18 0446 12/04/18 0533 12/05/18 0323  12/05/18 1235  12/05/18 2004 12/05/18 2113 12/06/18 0634  NA 140 143 142   < > 139   <  > 146* 146* 143  K 4.6 4.5 4.3   < > 5.2*   < > 4.3 4.5 4.5  CL 114* 114* 114*   < > 108  --  111  --  110  CO2 19* 19* 18*  --   --   --  20*  --  22  GLUCOSE 65* 101* 80   < > 173*  --  134*  --  108*  BUN 46* 40* 44*   < > 45*  --  39*  --  39*  CREATININE 3.18* 3.26* 3.46*   < > 2.50*  --  3.03*  --  2.87*  CALCIUM 8.4* 8.3* 8.4*  --   --   --  7.7*  --  8.0*  PHOS 4.6 4.9* 5.4*  --   --   --   --   --   --    < > = values in this interval not displayed.   Liver Function Tests: Recent Labs  Lab 12/03/18 0446 12/04/18 0533 12/05/18 0323  ALBUMIN 3.1* 3.0* 3.1*   No results for input(s): LIPASE, AMYLASE in the last 168 hours. No results for input(s): AMMONIA in the last 168 hours. CBC: Recent Labs  Lab 12/04/18 0533 12/05/18 1610  12/05/18 1405  12/05/18 2004 12/05/18 2113 12/06/18 0325  WBC 7.7 8.5  --  18.5*  --  14.4*  --  10.5  HGB 12.2* 12.2*   < > 11.1*   < > 10.4* 9.2* 9.7*  HCT 37.6* 37.4*   < > 33.5*   < > 31.3* 27.0* 29.7*  MCV 96.2 95.7  --  95.4  --  95.1  --  95.8  PLT 129* 124*   < > 116*  --  115*  --  87*   < > = values in this interval not displayed.   Cardiac Enzymes: No results for input(s): CKTOTAL, CKMB, CKMBINDEX, TROPONINI in the last 168 hours. CBG: Recent Labs  Lab 12/05/18 2318 12/06/18 0020 12/06/18 0107 12/06/18 0211 12/06/18 0651  GLUCAP 136* 123* 118* 105* 96    Iron Studies: No results for input(s): IRON, TIBC, TRANSFERRIN, FERRITIN in the last 72 hours. Studies/Results: Dg Chest 2 View  Result Date: 12/05/2018 CLINICAL DATA:  Preop EXAM: CHEST - 2 VIEW COMPARISON:  November 24, 2018 FINDINGS: The heart size and mediastinal contours are within normal limits. A left-sided pacemaker seen with the lead tips in the right atrium and right ventricle. Both lungs are clear. The visualized skeletal structures are unremarkable. IMPRESSION: No active cardiopulmonary disease. Electronically Signed   By: Prudencio Pair M.D.   On: 12/05/2018 02:58    Dg Chest Port 1 View  Result Date: 12/06/2018 CLINICAL DATA:  Cardiac surgery.  Chest. EXAM: PORTABLE CHEST 1 VIEW COMPARISON:  12/05/2018. FINDINGS: Cardiac pacer stable position. Swan-Ganz catheter tip noted in the right pulmonary artery. Mediastinal drainage catheter and left chest tube in stable position. No pneumothorax. Prior CABG. Left atrial appendage clip in stable position. Stable cardiomegaly. Mild pulmonary venous congestion. Bibasilar atelectasis/infiltrates. Small left pleural effusion again noted. Carotid vascular calcification. IMPRESSION: 1. Cardiac pacer stable position. Swan-Ganz catheter tip noted in the right pulmonary artery. Mediastinal drainage catheter and left chest tube in stable position. No pneumothorax. 2. Prior CABG. Left atrial appendage clip in stable position. Stable cardiomegaly. Mild pulmonary venous congestion. 3. Bibasilar atelectasis/infiltrates again noted without interim change. Small left pleural effusion again noted without interim change. 4.  Carotid vascular disease. Electronically Signed   By: Marcello Moores  Register   On: 12/06/2018 06:44   Dg Chest Port 1 View  Result Date: 12/05/2018 CLINICAL DATA:  Post CABG EXAM: PORTABLE CHEST 1 VIEW COMPARISON:  Chest radiograph from earlier today. FINDINGS: Endotracheal tube tip is 4.5 cm above the carina. Enteric tube enters stomach with the tip not seen on this image. Right internal jugular Swan-Ganz catheter terminates over the right pulmonary artery. Stable configuration of 2 lead left subclavian pacemaker. Intact sternotomy wires. Left atrial appendage clip overlies the left mediastinum. CABG clips overlie left mediastinum. Mediastinal drain terminates over left mediastinum. Left chest tube terminates over the left mid pleural space. Stable cardiomediastinal silhouette with mild cardiomegaly. No pneumothorax. No right pleural effusion. Blunting of the left costophrenic angle. Slightly low lung volumes with mild hazy  parahilar lung opacities. IMPRESSION: 1. No pneumothorax.  Well-positioned support structures. 2. Slightly low lung volumes. Mild hazy parahilar lung opacities, favor combination of atelectasis and possibly mild pulmonary edema. Blunting of the left costophrenic angle, favor atelectasis. Electronically Signed   By: Ilona Sorrel M.D.   On: 12/05/2018 14:16   Medications: Infusions: . sodium chloride 20 mL/hr at 12/06/18 0700  . sodium chloride    . sodium chloride 20 mL/hr at 12/05/18 1350  . albumin human 12.5 g (12/05/18 1539)  . amiodarone  30 mg/hr (12/06/18 0700)  . cefUROXime (ZINACEF)  IV Stopped (12/05/18 2028)  . dexmedetomidine (PRECEDEX) IV infusion Stopped (12/06/18 0307)  . insulin 1.4 mL/hr at 12/06/18 0700  . lactated ringers    . lactated ringers    . lactated ringers 20 mL/hr at 12/06/18 0700  . nitroGLYCERIN Stopped (12/05/18 1350)  . phenylephrine (NEO-SYNEPHRINE) Adult infusion Stopped (12/05/18 1958)    Scheduled Medications: . acetaminophen  1,000 mg Oral Q6H   Or  . acetaminophen (TYLENOL) oral liquid 160 mg/5 mL  1,000 mg Per Tube Q6H  . allopurinol  100 mg Oral BID  . aspirin EC  325 mg Oral Daily   Or  . aspirin  324 mg Per Tube Daily  . bisacodyl  10 mg Oral Daily   Or  . bisacodyl  10 mg Rectal Daily  . Chlorhexidine Gluconate Cloth  6 each Topical Daily  . docusate sodium  200 mg Oral Daily  . insulin regular  0-10 Units Intravenous TID WC  . mouth rinse  15 mL Mouth Rinse BID  . metoprolol tartrate  12.5 mg Oral BID   Or  . metoprolol tartrate  12.5 mg Per Tube BID  . [START ON 12/07/2018] pantoprazole  40 mg Oral Daily  . simvastatin  10 mg Oral QHS  . sodium chloride flush  10-40 mL Intracatheter Q12H  . sodium chloride flush  3 mL Intravenous Q12H    have reviewed scheduled and prn medications.  Physical Exam: General: awake, alert, sitting up in bed in NAD Heart: RRR Lungs: normal work of breathing on room air; decreased breath sounds at  bases  Abdomen: BS+; abdomen soft, non-tender, non-distended  Extremities: no pitting edema   12/06/2018,7:51 AM  LOS: 6 days

## 2018-12-06 NOTE — Progress Notes (Addendum)
The patient has been seen in conjunction with Nathanial Rancher, MD. All aspects of care have been considered and discussed. The patient has been personally interviewed, examined, and all clinical data has been reviewed.   No A Fib.  Not clinically volume overloaded.  Needs high intensity statin. Will switch to Rosuvastatin 20 mg daily. Lipid and liver panel 6 weeks.  Otherwise, no other changes.    Progress Note  Patient Name: Bradley Soto Date of Encounter: 12/06/2018  Primary Cardiologist: Mertie Moores, MD   Subjective   He is doing well this morning and only complains of chest soreness.  Denies shortness of breath  Inpatient Medications    Scheduled Meds:  acetaminophen  1,000 mg Oral Q6H   Or   acetaminophen (TYLENOL) oral liquid 160 mg/5 mL  1,000 mg Per Tube Q6H   allopurinol  100 mg Oral BID   aspirin EC  325 mg Oral Daily   Or   aspirin  324 mg Per Tube Daily   bisacodyl  10 mg Oral Daily   Or   bisacodyl  10 mg Rectal Daily   Chlorhexidine Gluconate Cloth  6 each Topical Daily   docusate sodium  200 mg Oral Daily   insulin regular  0-10 Units Intravenous TID WC   mouth rinse  15 mL Mouth Rinse BID   metoprolol tartrate  12.5 mg Oral BID   Or   metoprolol tartrate  12.5 mg Per Tube BID   [START ON 12/07/2018] pantoprazole  40 mg Oral Daily   simvastatin  10 mg Oral QHS   sodium chloride flush  10-40 mL Intracatheter Q12H   sodium chloride flush  3 mL Intravenous Q12H   Continuous Infusions:  sodium chloride 20 mL/hr at 12/06/18 0700   sodium chloride     sodium chloride 20 mL/hr at 12/05/18 1350   albumin human 12.5 g (12/05/18 1539)   amiodarone 30 mg/hr (12/06/18 0700)   cefUROXime (ZINACEF)  IV Stopped (12/05/18 2028)   dexmedetomidine (PRECEDEX) IV infusion Stopped (12/06/18 0307)   insulin 1.4 mL/hr at 12/06/18 0700   lactated ringers     lactated ringers     lactated ringers 20 mL/hr at 12/06/18 0700    nitroGLYCERIN Stopped (12/05/18 1350)   phenylephrine (NEO-SYNEPHRINE) Adult infusion Stopped (12/05/18 1958)   PRN Meds: sodium chloride, albumin human, lactated ringers, metoprolol tartrate, midazolam, morphine injection, ondansetron (ZOFRAN) IV, oxyCODONE, sodium chloride flush, sodium chloride flush, traMADol   Vital Signs    Vitals:   12/06/18 0500 12/06/18 0600 12/06/18 0615 12/06/18 0700  BP: 120/74 131/73  122/73  Pulse: 86 85 85 86  Resp: 11 17 19 14   Temp: (!) 96.8 F (36 C) (!) 97 F (36.1 C) (!) 97.2 F (36.2 C) (!) 97.3 F (36.3 C)  TempSrc:      SpO2: 98% 100% 99% 97%  Weight:      Height:        Intake/Output Summary (Last 24 hours) at 12/06/2018 0717 Last data filed at 12/06/2018 0700 Gross per 24 hour  Intake 4070.89 ml  Output 3527 ml  Net 543.89 ml   Filed Weights   12/03/18 0543 12/04/18 0626 12/06/18 0400  Weight: 107.5 kg 107 kg 104.7 kg    Telemetry    Atrially paced monitor- Personally Reviewed  ECG    Her RBBB, T wave inversion in V1 and aVR- Personally Reviewed  Physical Exam   GEN: No acute distress.   Cardiac: RRR, no murmurs,  rubs, or gallops.  Respiratory: Clear to auscultation bilaterally. MS: No edema; No deformity. Neuro:  Nonfocal  Psych: Normal affect   Labs    Chemistry Recent Labs  Lab 12/03/18 0446 12/04/18 0533 12/05/18 0323  12/05/18 1129 12/05/18 1235  12/05/18 1943 12/05/18 2004 12/05/18 2113  NA 140 143 142   < > 140 139   < > 146* 146* 146*  K 4.6 4.5 4.3   < > 5.7* 5.2*   < > 4.3 4.3 4.5  CL 114* 114* 114*   < > 110 108  --   --  111  --   CO2 19* 19* 18*  --   --   --   --   --  20*  --   GLUCOSE 65* 101* 80   < > 155* 173*  --   --  134*  --   BUN 46* 40* 44*   < > 42* 45*  --   --  39*  --   CREATININE 3.18* 3.26* 3.46*   < > 2.70* 2.50*  --   --  3.03*  --   CALCIUM 8.4* 8.3* 8.4*  --   --   --   --   --  7.7*  --   ALBUMIN 3.1* 3.0* 3.1*  --   --   --   --   --   --   --   GFRNONAA 18* 17*  16*  --   --   --   --   --  19*  --   GFRAA 20* 20* 18*  --   --   --   --   --  22*  --   ANIONGAP 7 10 10   --   --   --   --   --  15  --    < > = values in this interval not displayed.     Hematology Recent Labs  Lab 12/05/18 1405  12/05/18 2004 12/05/18 2113 12/06/18 0325  WBC 18.5*  --  14.4*  --  10.5  RBC 3.51*  --  3.29*  --  3.10*  HGB 11.1*   < > 10.4* 9.2* 9.7*  HCT 33.5*   < > 31.3* 27.0* 29.7*  MCV 95.4  --  95.1  --  95.8  MCH 31.6  --  31.6  --  31.3  MCHC 33.1  --  33.2  --  32.7  RDW 14.9  --  14.9  --  15.1  PLT 116*  --  115*  --  87*   < > = values in this interval not displayed.    Cardiac EnzymesNo results for input(s): TROPONINI in the last 168 hours. No results for input(s): TROPIPOC in the last 168 hours.   BNPNo results for input(s): BNP, PROBNP in the last 168 hours.   DDimer No results for input(s): DDIMER in the last 168 hours.   Radiology    Dg Chest 2 View  Result Date: 12/05/2018 CLINICAL DATA:  Preop EXAM: CHEST - 2 VIEW COMPARISON:  November 24, 2018 FINDINGS: The heart size and mediastinal contours are within normal limits. A left-sided pacemaker seen with the lead tips in the right atrium and right ventricle. Both lungs are clear. The visualized skeletal structures are unremarkable. IMPRESSION: No active cardiopulmonary disease. Electronically Signed   By: Prudencio Pair M.D.   On: 12/05/2018 02:58   Dg Chest Port 1 View  Result Date: 12/06/2018 CLINICAL DATA:  Cardiac surgery.  Chest. EXAM: PORTABLE CHEST 1 VIEW COMPARISON:  12/05/2018. FINDINGS: Cardiac pacer stable position. Swan-Ganz catheter tip noted in the right pulmonary artery. Mediastinal drainage catheter and left chest tube in stable position. No pneumothorax. Prior CABG. Left atrial appendage clip in stable position. Stable cardiomegaly. Mild pulmonary venous congestion. Bibasilar atelectasis/infiltrates. Small left pleural effusion again noted. Carotid vascular calcification.  IMPRESSION: 1. Cardiac pacer stable position. Swan-Ganz catheter tip noted in the right pulmonary artery. Mediastinal drainage catheter and left chest tube in stable position. No pneumothorax. 2. Prior CABG. Left atrial appendage clip in stable position. Stable cardiomegaly. Mild pulmonary venous congestion. 3. Bibasilar atelectasis/infiltrates again noted without interim change. Small left pleural effusion again noted without interim change. 4.  Carotid vascular disease. Electronically Signed   By: Marcello Moores  Register   On: 12/06/2018 06:44   Dg Chest Port 1 View  Result Date: 12/05/2018 CLINICAL DATA:  Post CABG EXAM: PORTABLE CHEST 1 VIEW COMPARISON:  Chest radiograph from earlier today. FINDINGS: Endotracheal tube tip is 4.5 cm above the carina. Enteric tube enters stomach with the tip not seen on this image. Right internal jugular Swan-Ganz catheter terminates over the right pulmonary artery. Stable configuration of 2 lead left subclavian pacemaker. Intact sternotomy wires. Left atrial appendage clip overlies the left mediastinum. CABG clips overlie left mediastinum. Mediastinal drain terminates over left mediastinum. Left chest tube terminates over the left mid pleural space. Stable cardiomediastinal silhouette with mild cardiomegaly. No pneumothorax. No right pleural effusion. Blunting of the left costophrenic angle. Slightly low lung volumes with mild hazy parahilar lung opacities. IMPRESSION: 1. No pneumothorax.  Well-positioned support structures. 2. Slightly low lung volumes. Mild hazy parahilar lung opacities, favor combination of atelectasis and possibly mild pulmonary edema. Blunting of the left costophrenic angle, favor atelectasis. Electronically Signed   By: Ilona Sorrel M.D.   On: 12/05/2018 14:16    Cardiac Studies   LHC 10/14  Ramus lesion is 99% stenosed.  Prox LAD to Mid LAD lesion is 70% stenosed.  Dist Cx lesion is 60% stenosed.  Mid RCA lesion is 95% stenosed.  3rd Mrg-1  lesion is 80% stenosed.  3rd Mrg-2 lesion is 99% stenosed.  1st LPL lesion is 99% stenosed.  1. Severe multi-vessel CAD 2. Moderately severe mid LAD stenosis 3. Severe restenosis of the stented segment in the proximal segment of the ramus intermediate branch 4. Severe restenosis mid Circumflex/obtuse marginal stented segment. Severe stenosis in the first obtuse marginal branch beyond the stented segment. Severe stenosis distal Circumflex leading into the moderate caliber second obtuse marginal branch.  5. Severe stenosis mid RCA  TTE 12/01/2018 1. Left ventricular ejection fraction, by visual estimation, is 60 to 65%. The left ventricle has normal function. Normal left ventricular size. Left ventricular septal wall thickness was mildly increased. Mildly increased left ventricular posterior  wall thickness. There is mildly increased left ventricular hypertrophy. 2. Left ventricular diastolic Doppler parameters are consistent with impaired relaxation pattern of LV diastolic filling. 3. Global right ventricle has normal systolic function.The right ventricular size is normal. No increase in right ventricular wall thickness. 4. Left atrial size was mildly dilated. 5. Right atrial size was normal. 6. Trivial pericardial effusion is present. 7. The mitral valve is normal in structure. No evidence of mitral valve regurgitation. No evidence of mitral stenosis. 8. The tricuspid valve is normal in structure. Tricuspid valve regurgitation was not visualized by color flow Doppler. 9. The aortic valve is normal in structure. Aortic valve regurgitation was  not visualized by color flow Doppler. Structurally normal aortic valve, with no evidence of sclerosis or stenosis. 10. The pulmonic valve was normal in structure. Pulmonic valve regurgitation is not visualized by color flow Doppler. 11. TR signal is inadequate for assessing pulmonary artery systolic pressure. 12. The inferior vena cava is  normal in size with <50% respiratory variability, suggesting right atrial pressure of 8 mmHg.  Carotid/ABI Right Carotid: Velocities in the right ICA are consistent with a 40-59% stenosis. The ECA appears >50% stenosed.  Left Carotid: Velocities in the left ICA are consistent with a 1-39% stenosis. The ECA appears >50% stenosed. Vertebrals: Bilateral vertebral arteries demonstrate antegrade flow. Subclavians: Normal flow hemodynamics were seen in bilateral subclavian arteries.  Right ABI: Resting right ankle-brachial index indicates noncompressible right lower extremity arteries. ABIs are unreliable. RT great toe pressure = 111 mmHg. PT and DP are noncompressible. Left ABI: Resting left ankle-brachial index indicates noncompressible left lower extremity arteries. ABIs are unreliable. LT Great toe pressure = 115 mmHg. PT is noncompressible. DP is unreliable. Right Upper Extremity: Doppler waveforms remain within normal limits with right radial compression. Doppler waveforms decrease >50% with right ulnar compression. Left Upper Extremity: Doppler waveforms remain within normal limits with left radial compression. Doppler waveforms decrease >50% with left ulnar compression.  12/05/2018-intraoperative TEE  POST-OP IMPRESSIONS - Left Ventricle: The left ventricle is unchanged from pre-bypass. - Right Ventricle: The right ventricle appears unchanged from pre-bypass. - Aorta: The aorta appears unchanged from pre-bypass. - Left Atrium: The left atrium appears unchanged from pre-bypass. - Left Atrial Appendage: The left atrial appendage appears unchanged from pre-bypass. - Aortic Valve: The aortic valve appears unchanged from pre-bypass. - Mitral Valve: The mitral valve appears unchanged from pre-bypass. - Tricuspid Valve: The tricuspid valve appears unchanged from pre-bypass. - Interatrial Septum: The interatrial septum appears unchanged from  pre-bypass. - Interventricular Septum: The interventricular septum appears unchanged from pre-bypass. - Pericardium: The pericardium appears unchanged from pre-bypass.  PRE-OP FINDINGS  Left Ventricle: The left ventricle has normal systolic function, with an ejection fraction of 55-60%. The cavity size was normal. There is mild upper septal hypertrophy.  Right Ventricle: The right ventricle has normal systolic function. The cavity was normal. There is no increase in right ventricular wall thickness.  Left Atrium: Left atrial size was dilated. There is continuous echo contrast seen in the left atrial cavity. The left atrial appendage is well visualized and there is no evidence of thrombus present.  Right Atrium: Right atrial size was normal in size. Right atrial pressure is estimated at 10 mmHg.  Interatrial Septum: The interatrial septum was not well visualized.  Pericardium: There is no evidence of pericardial effusion.  Mitral Valve: The mitral valve is normal in structure. Mitral valve regurgitation is not visualized by color flow Doppler.  Tricuspid Valve: The tricuspid valve was not well visualized. Tricuspid valve regurgitation was not visualized by color flow Doppler.  Aortic Valve: The aortic valve is tricuspid Aortic valve regurgitation was not visualized by color flow Doppler. There is no evidence of aortic valve stenosis.  Pulmonic Valve: The pulmonic valve was not well visualized. Pulmonic valve regurgitation was not assessed by color flow Doppler.   Aorta: The aortic root, ascending aorta and aortic arch are normal in size and structure. There is evidence of immobile plaque in the descending aorta.   Patient Profile     Merton Wadlow is a 78 y.o. male with CKD stage IV, diabetes on insulin, sick sinus syndrome status post  pacemaker implantation, paroxysmal atrial fibrillation, CAD status post multiple PCI's who was admitted on 10/14, and found to have  severe in-stent restenosis with multivessel CAD now status post CABG  Assessment & Plan    #Severe multivessel disease status post CABG x4 POD 1 He underwent CABG yesterday and is doing very well postop. -Continue aspirin, metoprolol, Zocor  #Paroxysmal atrial fibrillation -Continue amiodarone  #CKD stage IV Serum creatinine this a.m. of 2.8 (baseline 2.7-3.3) -Appreciate nephrology recommendation  #Hypertension BP stable this a.m. -Continue metoprolol  #Hyperlipidemia -Continue Zocor  #Sick sinus syndrome status post pacemaker HR stable  #Insulin-dependent diabetes mellitus -Continue insulin  #Normocytic anemia Hemoglobin this a.m. of 9.2 (9.7<<9.2).  Likely related to intraoperative blood loss -Continue to monitor    For questions or updates, please contact [place] Please consult [email or web address] for contact info under Cardiology/STEMI.      Signed, Jean Rosenthal, MD  12/06/2018, 7:17 AM

## 2018-12-06 NOTE — Addendum Note (Signed)
Addendum  created 12/06/18 1212 by Josephine Igo, CRNA   Order list changed

## 2018-12-06 NOTE — Progress Notes (Signed)
CT surgery p.m. Rounds  Patient sitting up in chair Stable hemodynamics with blood pressure 135/70, AV paced on amiodarone Urine output 20-30 cc/h After 5% albumin infused we will give 80 mg IV Lasix P.m. labs show creatinine at preop level 3.0.  P.m. hemoglobin satisfactory

## 2018-12-07 ENCOUNTER — Inpatient Hospital Stay (HOSPITAL_COMMUNITY): Payer: Medicare Other

## 2018-12-07 LAB — CBC WITH DIFFERENTIAL/PLATELET
Abs Immature Granulocytes: 0.06 10*3/uL (ref 0.00–0.07)
Basophils Absolute: 0 10*3/uL (ref 0.0–0.1)
Basophils Relative: 0 %
Eosinophils Absolute: 0 10*3/uL (ref 0.0–0.5)
Eosinophils Relative: 0 %
HCT: 27.6 % — ABNORMAL LOW (ref 39.0–52.0)
Hemoglobin: 9.1 g/dL — ABNORMAL LOW (ref 13.0–17.0)
Immature Granulocytes: 1 %
Lymphocytes Relative: 7 %
Lymphs Abs: 0.8 10*3/uL (ref 0.7–4.0)
MCH: 31.9 pg (ref 26.0–34.0)
MCHC: 33 g/dL (ref 30.0–36.0)
MCV: 96.8 fL (ref 80.0–100.0)
Monocytes Absolute: 1.2 10*3/uL — ABNORMAL HIGH (ref 0.1–1.0)
Monocytes Relative: 10 %
Neutro Abs: 9.7 10*3/uL — ABNORMAL HIGH (ref 1.7–7.7)
Neutrophils Relative %: 82 %
Platelets: 86 10*3/uL — ABNORMAL LOW (ref 150–400)
RBC: 2.85 MIL/uL — ABNORMAL LOW (ref 4.22–5.81)
RDW: 15.6 % — ABNORMAL HIGH (ref 11.5–15.5)
WBC: 11.6 10*3/uL — ABNORMAL HIGH (ref 4.0–10.5)
nRBC: 0 % (ref 0.0–0.2)

## 2018-12-07 LAB — GLUCOSE, CAPILLARY
Glucose-Capillary: 206 mg/dL — ABNORMAL HIGH (ref 70–99)
Glucose-Capillary: 236 mg/dL — ABNORMAL HIGH (ref 70–99)
Glucose-Capillary: 237 mg/dL — ABNORMAL HIGH (ref 70–99)
Glucose-Capillary: 269 mg/dL — ABNORMAL HIGH (ref 70–99)
Glucose-Capillary: 291 mg/dL — ABNORMAL HIGH (ref 70–99)

## 2018-12-07 LAB — RENAL FUNCTION PANEL
Albumin: 3.1 g/dL — ABNORMAL LOW (ref 3.5–5.0)
Anion gap: 11 (ref 5–15)
BUN: 48 mg/dL — ABNORMAL HIGH (ref 8–23)
CO2: 22 mmol/L (ref 22–32)
Calcium: 8.2 mg/dL — ABNORMAL LOW (ref 8.9–10.3)
Chloride: 106 mmol/L (ref 98–111)
Creatinine, Ser: 3.76 mg/dL — ABNORMAL HIGH (ref 0.61–1.24)
GFR calc Af Amer: 17 mL/min — ABNORMAL LOW (ref >60)
GFR calc non Af Amer: 14 mL/min — ABNORMAL LOW (ref >60)
Glucose, Bld: 225 mg/dL — ABNORMAL HIGH (ref 70–99)
Phosphorus: 5.2 mg/dL — ABNORMAL HIGH (ref 2.5–4.6)
Potassium: 4.8 mmol/L (ref 3.5–5.1)
Sodium: 139 mmol/L (ref 135–145)

## 2018-12-07 LAB — BASIC METABOLIC PANEL
Anion gap: 13 (ref 5–15)
BUN: 53 mg/dL — ABNORMAL HIGH (ref 8–23)
CO2: 22 mmol/L (ref 22–32)
Calcium: 8.3 mg/dL — ABNORMAL LOW (ref 8.9–10.3)
Chloride: 101 mmol/L (ref 98–111)
Creatinine, Ser: 4.09 mg/dL — ABNORMAL HIGH (ref 0.61–1.24)
GFR calc Af Amer: 15 mL/min — ABNORMAL LOW (ref >60)
GFR calc non Af Amer: 13 mL/min — ABNORMAL LOW (ref >60)
Glucose, Bld: 287 mg/dL — ABNORMAL HIGH (ref 70–99)
Potassium: 4.2 mmol/L (ref 3.5–5.1)
Sodium: 136 mmol/L (ref 135–145)

## 2018-12-07 MED ORDER — OLANZAPINE 5 MG PO TABS
5.0000 mg | ORAL_TABLET | Freq: Every day | ORAL | Status: DC
  Administered 2018-12-08 – 2018-12-10 (×4): 5 mg via ORAL
  Filled 2018-12-07 (×7): qty 1

## 2018-12-07 MED ORDER — AMIODARONE HCL 200 MG PO TABS
400.0000 mg | ORAL_TABLET | Freq: Two times a day (BID) | ORAL | Status: DC
  Administered 2018-12-07 – 2018-12-11 (×8): 400 mg via ORAL
  Filled 2018-12-07 (×8): qty 2

## 2018-12-07 MED ORDER — DARBEPOETIN ALFA 100 MCG/0.5ML IJ SOSY
100.0000 ug | PREFILLED_SYRINGE | INTRAMUSCULAR | Status: DC
  Administered 2018-12-07: 100 ug via SUBCUTANEOUS
  Filled 2018-12-07: qty 0.5

## 2018-12-07 MED ORDER — METOPROLOL TARTRATE 25 MG PO TABS
25.0000 mg | ORAL_TABLET | Freq: Two times a day (BID) | ORAL | Status: DC
  Administered 2018-12-07 – 2018-12-08 (×3): 25 mg via ORAL
  Filled 2018-12-07 (×3): qty 1

## 2018-12-07 MED ORDER — METOPROLOL TARTRATE 25 MG/10 ML ORAL SUSPENSION: 25.0000 mg | Freq: Two times a day (BID) | ORAL | Status: DC

## 2018-12-07 MED FILL — Heparin Sodium (Porcine) Inj 1000 Unit/ML: INTRAMUSCULAR | Qty: 10 | Status: AC

## 2018-12-07 MED FILL — Magnesium Sulfate Inj 50%: INTRAMUSCULAR | Qty: 10 | Status: AC

## 2018-12-07 MED FILL — Sodium Chloride IV Soln 0.9%: INTRAVENOUS | Qty: 2000 | Status: AC

## 2018-12-07 MED FILL — Electrolyte-R (PH 7.4) Solution: INTRAVENOUS | Qty: 4000 | Status: AC

## 2018-12-07 MED FILL — Sodium Bicarbonate IV Soln 8.4%: INTRAVENOUS | Qty: 100 | Status: AC

## 2018-12-07 MED FILL — Mannitol IV Soln 20%: INTRAVENOUS | Qty: 500 | Status: AC

## 2018-12-07 NOTE — Progress Notes (Signed)
   Neuro okay.  No arrhythmias.  Creatinine trending up, and recognized by team.  Stable from cardiology viewpoint.

## 2018-12-07 NOTE — Plan of Care (Signed)

## 2018-12-07 NOTE — Progress Notes (Addendum)
Subjective:  No acute events overnight. Bradley Soto was frustrated that he did not sleep much last night. He also notes that he quickly gets out of breath with any type of exertion and has a hard time taking a full breath. He is hopeful that removing the chest tube should help with this. Denies any chest pain or leg swelling. Urine output remains ~ 60 ml/hr- lasix drip was added on . Crt up from yesterday evening. Definitely more pessimistic today    Objective Vital signs in last 24 hours: Vitals:   12/07/18 0500 12/07/18 0600 12/07/18 0630 12/07/18 0700  BP: (!) 141/66 (!) 143/56  (!) 136/101  Pulse: (!) 43 67  (!) 42  Resp: 17 17  20   Temp:      TempSrc:      SpO2: 97% 94%  96%  Weight:   117.2 kg   Height:       Weight change: 12.5 kg  Intake/Output Summary (Last 24 hours) at 12/07/2018 6063 Last data filed at 12/07/2018 0700 Gross per 24 hour  Intake 2075.04 ml  Output 1339 ml  Net 736.04 ml    Assessment/ Plan: Pt is a78 y.o.yo malewith HTN, DM and baseline stage 4 CKD (crt 2.8 to 3.3)who was admitted on10/14/2020with [place] underwent cardiac cath showing severe multivessel CAD, now s/p CABG on 10/19.    Assessment/Plan: 1.Renal-Stage 4 CKD at baseline;  high risk for requiring RRT postoperatively.  - maintaining steady urine output but requiring lasix drip; crt trended up from 3.1>3.7, electrolytes otherwise stable  - placed on Lasix drip per cardiology   - Renal will continue following labs and UOP closely.  No indications for dialysis today   2.HTN/vol- does not have significant peripheral edema; CXR unchanged compared to yesterday. Will monitor on Lasix drip.  - Blood pressure stable  3. Anemia- Hgb stable this morning; follow closely and transfuse PRN; Will  add ESA as well  Delice Bison   Patient seen and examined, agree with above note with above modifications. Pt more discouraged today.  Hemodynamically seems good.  Lasix drip was added on- some  worsening of renal function but no needs for dialysis as of yet.  No particular changes, will cont to monitor renal function and UOP.  Will add ESA   Corliss Parish, MD 12/07/2018      Labs: Basic Metabolic Panel: Recent Labs  Lab 12/04/18 0533 12/05/18 0323  12/06/18 0160 12/06/18 0728 12/06/18 1600 12/07/18 0309  NA 143 142   < > 143 143 139 139  K 4.5 4.3   < > 4.5 4.6 4.2 4.8  CL 114* 114*   < > 110  --  106 106  CO2 19* 18*   < > 22  --  22 22  GLUCOSE 101* 80   < > 108*  --  121* 225*  BUN 40* 44*   < > 39*  --  41* 48*  CREATININE 3.26* 3.46*   < > 2.87*  --  3.10* 3.76*  CALCIUM 8.3* 8.4*   < > 8.0*  --  7.9* 8.2*  PHOS 4.9* 5.4*  --   --   --   --  5.2*   < > = values in this interval not displayed.   Liver Function Tests: Recent Labs  Lab 12/04/18 0533 12/05/18 0323 12/07/18 0309  ALBUMIN 3.0* 3.1* 3.1*   No results for input(s): LIPASE, AMYLASE in the last 168 hours. No results for input(s): AMMONIA in  the last 168 hours. CBC: Recent Labs  Lab 12/05/18 1405  12/05/18 2004  12/06/18 0325 12/06/18 0728 12/06/18 1600 12/07/18 0309  WBC 18.5*  --  14.4*  --  10.5  --  13.5* 11.6*  NEUTROABS  --   --   --   --   --   --   --  9.7*  HGB 11.1*   < > 10.4*   < > 9.7* 9.2* 9.6* 9.1*  HCT 33.5*   < > 31.3*   < > 29.7* 27.0* 29.2* 27.6*  MCV 95.4  --  95.1  --  95.8  --  96.4 96.8  PLT 116*  --  115*  --  87*  --  86* 86*   < > = values in this interval not displayed.   Cardiac Enzymes: No results for input(s): CKTOTAL, CKMB, CKMBINDEX, TROPONINI in the last 168 hours. CBG: Recent Labs  Lab 12/06/18 1727 12/06/18 1902 12/06/18 2028 12/06/18 2353 12/07/18 0343  GLUCAP 159* 197* 206* 197* 206*    Iron Studies: No results for input(s): IRON, TIBC, TRANSFERRIN, FERRITIN in the last 72 hours. Studies/Results: Dg Chest Port 1 View  Result Date: 12/06/2018 CLINICAL DATA:  Cardiac surgery.  Chest. EXAM: PORTABLE CHEST 1 VIEW COMPARISON:   12/05/2018. FINDINGS: Cardiac pacer stable position. Swan-Ganz catheter tip noted in the right pulmonary artery. Mediastinal drainage catheter and left chest tube in stable position. No pneumothorax. Prior CABG. Left atrial appendage clip in stable position. Stable cardiomegaly. Mild pulmonary venous congestion. Bibasilar atelectasis/infiltrates. Small left pleural effusion again noted. Carotid vascular calcification. IMPRESSION: 1. Cardiac pacer stable position. Swan-Ganz catheter tip noted in the right pulmonary artery. Mediastinal drainage catheter and left chest tube in stable position. No pneumothorax. 2. Prior CABG. Left atrial appendage clip in stable position. Stable cardiomegaly. Mild pulmonary venous congestion. 3. Bibasilar atelectasis/infiltrates again noted without interim change. Small left pleural effusion again noted without interim change. 4.  Carotid vascular disease. Electronically Signed   By: Marcello Moores  Register   On: 12/06/2018 06:44   Dg Chest Port 1 View  Result Date: 12/05/2018 CLINICAL DATA:  Post CABG EXAM: PORTABLE CHEST 1 VIEW COMPARISON:  Chest radiograph from earlier today. FINDINGS: Endotracheal tube tip is 4.5 cm above the carina. Enteric tube enters stomach with the tip not seen on this image. Right internal jugular Swan-Ganz catheter terminates over the right pulmonary artery. Stable configuration of 2 lead left subclavian pacemaker. Intact sternotomy wires. Left atrial appendage clip overlies the left mediastinum. CABG clips overlie left mediastinum. Mediastinal drain terminates over left mediastinum. Left chest tube terminates over the left mid pleural space. Stable cardiomediastinal silhouette with mild cardiomegaly. No pneumothorax. No right pleural effusion. Blunting of the left costophrenic angle. Slightly low lung volumes with mild hazy parahilar lung opacities. IMPRESSION: 1. No pneumothorax.  Well-positioned support structures. 2. Slightly low lung volumes. Mild hazy  parahilar lung opacities, favor combination of atelectasis and possibly mild pulmonary edema. Blunting of the left costophrenic angle, favor atelectasis. Electronically Signed   By: Ilona Sorrel M.D.   On: 12/05/2018 14:16   Medications: Infusions: . sodium chloride Stopped (12/06/18 1009)  . sodium chloride    . sodium chloride 20 mL/hr at 12/05/18 1350  . amiodarone 30 mg/hr (12/07/18 0749)  . furosemide (LASIX) infusion 8 mg/hr (12/07/18 0700)  . lactated ringers    . lactated ringers    . lactated ringers 20 mL/hr at 12/07/18 0700    Scheduled Medications: . acetaminophen  1,000 mg Oral Q6H   Or  . acetaminophen (TYLENOL) oral liquid 160 mg/5 mL  1,000 mg Per Tube Q6H  . allopurinol  100 mg Oral BID  . aspirin EC  325 mg Oral Daily   Or  . aspirin  324 mg Per Tube Daily  . bisacodyl  10 mg Oral Daily   Or  . bisacodyl  10 mg Rectal Daily  . Chlorhexidine Gluconate Cloth  6 each Topical Daily  . docusate sodium  200 mg Oral Daily  . insulin aspart  0-24 Units Subcutaneous Q4H  . insulin detemir  10 Units Subcutaneous Q12H  . mouth rinse  15 mL Mouth Rinse BID  . metoprolol tartrate  12.5 mg Oral BID   Or  . metoprolol tartrate  12.5 mg Per Tube BID  . pantoprazole  40 mg Oral Daily  . rosuvastatin  20 mg Oral q1800  . sodium chloride flush  10-40 mL Intracatheter Q12H  . sodium chloride flush  3 mL Intravenous Q12H    have reviewed scheduled and prn medications.  Physical Exam: General: awake, alert, sitting up in the chair in NAD Heart: RRR Lungs: normal work of breathing; bibasilar crackles  Abdomen: soft, non-tender, non-distended Extremities: no pitting edema    12/07/2018,8:26 AM  LOS: 7 days

## 2018-12-07 NOTE — Progress Notes (Signed)
[facility] BRIEF SICU PROGRESS NOTE  2 Days Post-Op  S/P Procedure(s) (LRB): CORONARY ARTERY BYPASS GRAFTING (CABG) X FOUR USING LEFT INTERNAL MAMMARY ARTERY AND RIGHT SAPHENOUS VEIN GRAFTS (N/A) MAZE (N/A) TRANSESOPHAGEAL ECHOCARDIOGRAM (TEE) (N/A) Clipping Of Atrial Appendage   Stable day Maintaining AV paced rhythm w/ stable BP although BP trending up Breathing comfortably w/ O2 sats 90-100% UOP excellent Labs okay although creatinine up slightly from this morning to 4.09  Plan: Continue current plan  Rexene Alberts, MD 12/07/2018 7:09 PM

## 2018-12-07 NOTE — Progress Notes (Signed)
2 Days Post-Op Procedure(s) (LRB): CORONARY ARTERY BYPASS GRAFTING (CABG) X FOUR USING LEFT INTERNAL MAMMARY ARTERY AND RIGHT SAPHENOUS VEIN GRAFTS (N/A) MAZE (N/A) TRANSESOPHAGEAL ECHOCARDIOGRAM (TEE) (N/A) Clipping Of Atrial Appendage Subjective: Feeling tired; little sleep Objective: Vital signs in last 24 hours: Temp:  [96 F (35.6 C)-97.9 F (36.6 C)] 97.8 F (36.6 C) (10/21 0831) Pulse Rate:  [31-93] 46 (10/21 0800) Cardiac Rhythm: Atrial paced (10/21 0800) Resp:  [11-32] 18 (10/21 0800) BP: (95-166)/(52-101) 166/69 (10/21 0800) SpO2:  [83 %-97 %] 94 % (10/21 0800) Arterial Line BP: (123-137)/(50-59) 131/59 (10/20 1015) Weight:  [117.2 kg] 117.2 kg (10/21 0630)  Hemodynamic parameters for last 24 hours: CVP:  [14 mmHg-17 mmHg] 16 mmHg  Intake/Output from previous day: 10/20 0701 - 10/21 0700 In: 2133.1 [P.O.:490; I.V.:1066.4; IV Piggyback:576.7] Out: 1449 [Urine:1129; Chest Tube:320] Intake/Output this shift: Total I/O In: 43.4 [I.V.:43.4] Out: 70 [Urine:60; Chest Tube:10]  General appearance: alert, cooperative and no distress Neurologic: intact Heart: regular rate and rhythm Lungs: rales bibasilar Abdomen: soft, non-tender; bowel sounds normal; no masses,  no organomegaly Extremities: extremities normal, atraumatic, no cyanosis or edema Wound: dressed, dry  Lab Results: Recent Labs    12/06/18 1600 12/07/18 0309  WBC 13.5* 11.6*  HGB 9.6* 9.1*  HCT 29.2* 27.6*  PLT 86* 86*   BMET:  Recent Labs    12/06/18 1600 12/07/18 0309  NA 139 139  K 4.2 4.8  CL 106 106  CO2 22 22  GLUCOSE 121* 225*  BUN 41* 48*  CREATININE 3.10* 3.76*  CALCIUM 7.9* 8.2*    PT/INR:  Recent Labs    12/05/18 1405  LABPROT 16.2*  INR 1.3*   ABG    Component Value Date/Time   PHART 7.377 12/06/2018 0728   HCO3 22.1 12/06/2018 0728   TCO2 23 12/06/2018 0728   ACIDBASEDEF 3.0 (H) 12/06/2018 0728   O2SAT 90.0 12/06/2018 0728   CBG (last 3)  Recent Labs   12/06/18 2353 12/07/18 0343 12/07/18 0720  GLUCAP 197* 206* 236*    Assessment/Plan: S/P Procedure(s) (LRB): CORONARY ARTERY BYPASS GRAFTING (CABG) X FOUR USING LEFT INTERNAL MAMMARY ARTERY AND RIGHT SAPHENOUS VEIN GRAFTS (N/A) MAZE (N/A) TRANSESOPHAGEAL ECHOCARDIOGRAM (TEE) (N/A) Clipping Of Atrial Appendage Mobilize Diuresis d/c tubes/lines   LOS: 7 days    Wonda Olds 12/07/2018

## 2018-12-07 NOTE — Progress Notes (Signed)
Inpatient Diabetes Program Recommendations  AACE/ADA: New Consensus Statement on Inpatient Glycemic Control (2015)  Target Ranges:  Prepandial:   less than 140 mg/dL      Peak postprandial:   less than 180 mg/dL (1-2 hours)      Critically ill patients:  140 - 180 mg/dL   Lab Results  Component Value Date   GLUCAP 237 (H) 12/07/2018   HGBA1C 8.3 (H) 12/01/2018    Review of Glycemic Control Results for Bradley Soto, KROTZER "JAIME GRIZZELL" (MRN [id number]) as of 12/07/2018 14:40  Ref. Range 12/06/2018 20:28 12/06/2018 23:53 12/07/2018 03:43 12/07/2018 07:20 12/07/2018 12:00  Glucose-Capillary Latest Ref Range: 70 - 99 mg/dL 206 (H) 197 (H) 206 (H) 236 (H) 237 (H)   Diabetes history: DM2 Outpatient Diabetes medications: Novolog flexpen 70/30 insulin 35 units bid Current orders for Inpatient glycemic control: Levemir 10 units bid + Novolog 0-24 correction q 4 hrs.  Inpatient Diabetes Program Recommendations:   CBGs elevated >200. Please consider: -Increase Levemir to 12 units bid -Add Novolog 3 units tid meal coverage if eats 50%  Thank you, Bethena Roys E. Syanna Remmert, RN, MSN, CDE  Diabetes Coordinator Inpatient Glycemic Control Team Team Pager [phone] (8am-5pm) 12/07/2018 2:43 PM

## 2018-12-08 ENCOUNTER — Inpatient Hospital Stay (HOSPITAL_COMMUNITY): Payer: Medicare Other

## 2018-12-08 LAB — RENAL FUNCTION PANEL
Albumin: 2.6 g/dL — ABNORMAL LOW (ref 3.5–5.0)
Anion gap: 9 (ref 5–15)
BUN: 61 mg/dL — ABNORMAL HIGH (ref 8–23)
CO2: 24 mmol/L (ref 22–32)
Calcium: 8 mg/dL — ABNORMAL LOW (ref 8.9–10.3)
Chloride: 105 mmol/L (ref 98–111)
Creatinine, Ser: 4.13 mg/dL — ABNORMAL HIGH (ref 0.61–1.24)
GFR calc Af Amer: 15 mL/min — ABNORMAL LOW (ref >60)
GFR calc non Af Amer: 13 mL/min — ABNORMAL LOW (ref >60)
Glucose, Bld: 134 mg/dL — ABNORMAL HIGH (ref 70–99)
Phosphorus: 4.9 mg/dL — ABNORMAL HIGH (ref 2.5–4.6)
Potassium: 3.9 mmol/L (ref 3.5–5.1)
Sodium: 138 mmol/L (ref 135–145)

## 2018-12-08 LAB — GLUCOSE, CAPILLARY
Glucose-Capillary: 139 mg/dL — ABNORMAL HIGH (ref 70–99)
Glucose-Capillary: 146 mg/dL — ABNORMAL HIGH (ref 70–99)
Glucose-Capillary: 187 mg/dL — ABNORMAL HIGH (ref 70–99)
Glucose-Capillary: 189 mg/dL — ABNORMAL HIGH (ref 70–99)
Glucose-Capillary: 197 mg/dL — ABNORMAL HIGH (ref 70–99)
Glucose-Capillary: 198 mg/dL — ABNORMAL HIGH (ref 70–99)

## 2018-12-08 LAB — CBC WITH DIFFERENTIAL/PLATELET
Abs Immature Granulocytes: 0.08 10*3/uL — ABNORMAL HIGH (ref 0.00–0.07)
Basophils Absolute: 0 10*3/uL (ref 0.0–0.1)
Basophils Relative: 0 %
Eosinophils Absolute: 0.1 10*3/uL (ref 0.0–0.5)
Eosinophils Relative: 1 %
HCT: 26.2 % — ABNORMAL LOW (ref 39.0–52.0)
Hemoglobin: 8.2 g/dL — ABNORMAL LOW (ref 13.0–17.0)
Immature Granulocytes: 1 %
Lymphocytes Relative: 9 %
Lymphs Abs: 0.9 10*3/uL (ref 0.7–4.0)
MCH: 31.1 pg (ref 26.0–34.0)
MCHC: 31.3 g/dL (ref 30.0–36.0)
MCV: 99.2 fL (ref 80.0–100.0)
Monocytes Absolute: 0.9 10*3/uL (ref 0.1–1.0)
Monocytes Relative: 9 %
Neutro Abs: 7.5 10*3/uL (ref 1.7–7.7)
Neutrophils Relative %: 80 %
Platelets: 83 10*3/uL — ABNORMAL LOW (ref 150–400)
RBC: 2.64 MIL/uL — ABNORMAL LOW (ref 4.22–5.81)
RDW: 15.2 % (ref 11.5–15.5)
WBC: 9.4 10*3/uL (ref 4.0–10.5)
nRBC: 0 % (ref 0.0–0.2)

## 2018-12-08 MED ORDER — METOPROLOL TARTRATE 25 MG PO TABS
25.0000 mg | ORAL_TABLET | Freq: Three times a day (TID) | ORAL | Status: DC
  Administered 2018-12-09 – 2018-12-11 (×5): 25 mg via ORAL
  Filled 2018-12-08 (×7): qty 1

## 2018-12-08 MED ORDER — FERROUS FUMARATE 324 (106 FE) MG PO TABS
1.0000 | ORAL_TABLET | Freq: Two times a day (BID) | ORAL | Status: DC
  Administered 2018-12-08 – 2018-12-11 (×7): 106 mg via ORAL
  Filled 2018-12-08 (×9): qty 1

## 2018-12-08 MED ORDER — METOPROLOL TARTRATE 25 MG/10 ML ORAL SUSPENSION
25.0000 mg | Freq: Three times a day (TID) | ORAL | Status: DC
  Administered 2018-12-10: 25 mg
  Filled 2018-12-08 (×8): qty 10

## 2018-12-08 MED ORDER — POTASSIUM CHLORIDE CRYS ER 20 MEQ PO TBCR
40.0000 meq | EXTENDED_RELEASE_TABLET | Freq: Once | ORAL | Status: AC
  Administered 2018-12-08: 40 meq via ORAL
  Filled 2018-12-08: qty 2

## 2018-12-08 NOTE — Progress Notes (Signed)
Patient ID: Bradley Soto, male   DOB: 07/24/1940, 78 y.o.   MRN: [id number] [facility] Evening Rounds:  Hemodynamically stable Remains on lasix drip at 8. Had good urine output while foley in but has not voided yet since removed.  Ambulating well.

## 2018-12-08 NOTE — Progress Notes (Addendum)
Subjective:  Having excellent urine output. Remains on Lasix drip. Crt trending up but rate of rise is lower. Feeling well this morning without chest pain or shortness of breath. Was able to get more rest last night.    Objective Vital signs in last 24 hours: Vitals:   12/08/18 0500 12/08/18 0600 12/08/18 0700 12/08/18 0800  BP: 117/61 127/66 116/63 (!) 116/58  Pulse: 60 (!) 59 (!) 58 (!) 44  Resp: 11 14 18 19   Temp:   (!) 97.5 F (36.4 C)   TempSrc:   Oral   SpO2: 95% 96% 95% 94%  Weight: 112.5 kg     Height:       Weight change: -4.7 kg  Intake/Output Summary (Last 24 hours) at 12/08/2018 7001 Last data filed at 12/08/2018 0800 Gross per 24 hour  Intake 1296.23 ml  Output 2980 ml  Net -1683.77 ml    Assessment/ Plan: Pt is a78 y.o.yo malewith HTN, DM and baseline stage 4 CKD (crt 2.8 to 3.3)who was admitted on10/14/2020with [place] underwent cardiac cath showing severe multivessel CAD, now s/p CABGon 10/19.  Assessment/Plan: 1.Renal-Stage 4 CKD at baseline;  high risk for requiring RRTpostoperatively. He is having excellent urine output on Lasix drip; crt only mildly increased from yesterday 3.7>4.1 which hopefully indicates that it is plateauing. Electrolytes otherwise stable. No indication for HD at this point. Will continue to monitor renal function and UOP.  2.HTN/vol-1+ pitting edema in lower extremities; lungs clear. Blood pressure tolerating Lasix drip.  3. Anemia-Hgb 8.2;follow closely and transfuse PRN; continue ESA.   Delice Bison   Patient seen and examined, agree with above note with above modifications. Pt happier today, got more rest overnight.  Having excellent UOP On lasix 8 mg /hour which is not a huge dose of lasix.  Crt up but rate of rise less and no indications for HD.  Will continue to watch closely.  K slightly low, will give repletion  Corliss Parish, MD 12/08/2018      Labs: Basic Metabolic Panel: Recent Labs  Lab  12/05/18 0323  12/07/18 0309 12/07/18 1509 12/08/18 0525  NA 142   < > 139 136 138  K 4.3   < > 4.8 4.2 3.9  CL 114*   < > 106 101 105  CO2 18*   < > 22 22 24   GLUCOSE 80   < > 225* 287* 134*  BUN 44*   < > 48* 53* 61*  CREATININE 3.46*   < > 3.76* 4.09* 4.13*  CALCIUM 8.4*   < > 8.2* 8.3* 8.0*  PHOS 5.4*  --  5.2*  --  4.9*   < > = values in this interval not displayed.   Liver Function Tests: Recent Labs  Lab 12/05/18 0323 12/07/18 0309 12/08/18 0525  ALBUMIN 3.1* 3.1* 2.6*   No results for input(s): LIPASE, AMYLASE in the last 168 hours. No results for input(s): AMMONIA in the last 168 hours. CBC: Recent Labs  Lab 12/05/18 2004  12/06/18 0325  12/06/18 1600 12/07/18 0309 12/08/18 0525  WBC 14.4*  --  10.5  --  13.5* 11.6* 9.4  NEUTROABS  --   --   --   --   --  9.7* 7.5  HGB 10.4*   < > 9.7*   < > 9.6* 9.1* 8.2*  HCT 31.3*   < > 29.7*   < > 29.2* 27.6* 26.2*  MCV 95.1  --  95.8  --  96.4 96.8 99.2  PLT 115*  --  87*  --  86* 86* 83*   < > = values in this interval not displayed.   Cardiac Enzymes: No results for input(s): CKTOTAL, CKMB, CKMBINDEX, TROPONINI in the last 168 hours. CBG: Recent Labs  Lab 12/07/18 1511 12/07/18 1942 12/08/18 0027 12/08/18 0430 12/08/18 0754  GLUCAP 269* 291* 187* 139* 197*    Iron Studies: No results for input(s): IRON, TIBC, TRANSFERRIN, FERRITIN in the last 72 hours. Studies/Results: Dg Chest Port 1 View  Result Date: 12/08/2018 CLINICAL DATA:  Status post cardiac surgery. EXAM: PORTABLE CHEST 1 VIEW COMPARISON:  December 07, 2018. FINDINGS: Stable cardiomegaly with central pulmonary vascular congestion. No pneumothorax is noted. Right internal jugular venous sheath is unchanged. Left-sided pacemaker is unchanged. Mild bibasilar atelectasis is noted. Bony thorax is unremarkable. IMPRESSION: Stable cardiomegaly with central pulmonary vascular congestion. Mild bibasilar subsegmental atelectasis. Electronically Signed   By:  Marijo Conception M.D.   On: 12/08/2018 07:06   Dg Chest Port 1 View  Result Date: 12/07/2018 CLINICAL DATA:  Postop open heart surgery EXAM: PORTABLE CHEST 1 VIEW COMPARISON:  12/06/2018 FINDINGS: Swan-Ganz catheter removed. Right jugular sheath remains in place. Changes of open heart surgery with dual lead pacemaker. Negative for pneumothorax. Left chest tube remains in place. Cardiac enlargement with vascular congestion. Negative for edema. Left lower lobe atelectasis and small left effusion unchanged. Right lung clear. IMPRESSION: Persistent left lower lobe atelectasis and small effusion unchanged. Mild vascular congestion.  No edema. Electronically Signed   By: Franchot Gallo M.D.   On: 12/07/2018 08:38   Medications: Infusions: . sodium chloride Stopped (12/06/18 1009)  . sodium chloride    . sodium chloride 20 mL/hr at 12/05/18 1350  . furosemide (LASIX) infusion 8 mg/hr (12/08/18 0800)  . lactated ringers    . lactated ringers    . lactated ringers 10 mL/hr at 12/08/18 0800    Scheduled Medications: . acetaminophen  1,000 mg Oral Q6H   Or  . acetaminophen (TYLENOL) oral liquid 160 mg/5 mL  1,000 mg Per Tube Q6H  . allopurinol  100 mg Oral BID  . amiodarone  400 mg Oral BID  . aspirin EC  325 mg Oral Daily   Or  . aspirin  324 mg Per Tube Daily  . bisacodyl  10 mg Oral Daily   Or  . bisacodyl  10 mg Rectal Daily  . Chlorhexidine Gluconate Cloth  6 each Topical Daily  . darbepoetin (ARANESP) injection - NON-DIALYSIS  100 mcg Subcutaneous Q Wed-1800  . docusate sodium  200 mg Oral Daily  . insulin aspart  0-24 Units Subcutaneous Q4H  . insulin detemir  10 Units Subcutaneous Q12H  . mouth rinse  15 mL Mouth Rinse BID  . metoprolol tartrate  25 mg Oral BID   Or  . metoprolol tartrate  25 mg Per Tube BID  . OLANZapine  5 mg Oral QHS  . pantoprazole  40 mg Oral Daily  . rosuvastatin  20 mg Oral q1800  . sodium chloride flush  10-40 mL Intracatheter Q12H  . sodium chloride  flush  3 mL Intravenous Q12H    have reviewed scheduled and prn medications.  Physical Exam: General: awake, alert, sitting up in bed in NAD Heart: RRR Lungs: normal work of breathing; lungs CTAB Abdomen: soft, non-tender, non-distended Extremities: 1+ pitting edema BLE     12/08/2018,8:23 AM  LOS: 8 days

## 2018-12-08 NOTE — Progress Notes (Signed)
Patient was asked to ambulate this morning. Patient refused and stated "I do not want to walk until after 7am." Patient refused education. Will pass along to dayshift RN.

## 2018-12-08 NOTE — Plan of Care (Signed)
  Problem: Clinical Measurements: Goal: Ability to maintain clinical measurements within normal limits will improve Outcome: Progressing Goal: Will remain free from infection Outcome: Progressing Goal: Respiratory complications will improve Outcome: Progressing Goal: Cardiovascular complication will be avoided Outcome: Progressing   Problem: Activity: Goal: Risk for activity intolerance will decrease Outcome: Progressing   Problem: Nutrition: Goal: Adequate nutrition will be maintained Outcome: Progressing   Problem: Coping: Goal: Level of anxiety will decrease Outcome: Progressing   Problem: Elimination: Goal: Will not experience complications related to urinary retention Outcome: Progressing   Problem: Pain Managment: Goal: General experience of comfort will improve Outcome: Progressing   Problem: Safety: Goal: Ability to remain free from injury will improve Outcome: Progressing   Problem: Skin Integrity: Goal: Risk for impaired skin integrity will decrease Outcome: Progressing   Problem: Cardiac: Goal: Ability to achieve and maintain adequate cardiopulmonary perfusion will improve Outcome: Progressing   Problem: Coping: Goal: Ability to adjust to condition or change in health will improve Outcome: Progressing   Problem: Fluid Volume: Goal: Ability to maintain a balanced intake and output will improve Outcome: Progressing   Problem: Skin Integrity: Goal: Risk for impaired skin integrity will decrease Outcome: Progressing   Problem: Tissue Perfusion: Goal: Adequacy of tissue perfusion will improve Outcome: Progressing   Problem: Education: Goal: Knowledge of the prescribed therapeutic regimen will improve Outcome: Progressing   Problem: Activity: Goal: Risk for activity intolerance will decrease Outcome: Progressing   Problem: Cardiac: Goal: Will achieve and/or maintain hemodynamic stability Outcome: Progressing   Problem: Clinical  Measurements: Goal: Postoperative complications will be avoided or minimized Outcome: Progressing   Problem: Respiratory: Goal: Respiratory status will improve Outcome: Progressing   Problem: Skin Integrity: Goal: Wound healing without signs and symptoms of infection Outcome: Progressing

## 2018-12-08 NOTE — Progress Notes (Signed)
RT offered to place pt on CPAP dream station for the night. Pt stated that if I cut the CPAP on he can place himself on it. RT checked water in Humidification chamber and added sterile water for the pt. Pt respiratory status stable at this time on RA. Pt is set on his home setting of 8 cmH2O w/no O2 bled into the system.

## 2018-12-08 NOTE — Progress Notes (Signed)
3 Days Post-Op Procedure(s) (LRB): CORONARY ARTERY BYPASS GRAFTING (CABG) X FOUR USING LEFT INTERNAL MAMMARY ARTERY AND RIGHT SAPHENOUS VEIN GRAFTS (N/A) MAZE (N/A) TRANSESOPHAGEAL ECHOCARDIOGRAM (TEE) (N/A) Clipping Of Atrial Appendage Subjective: No complaints Objective: Vital signs in last 24 hours: Temp:  [97.5 F (36.4 C)-98.7 F (37.1 C)] 97.5 F (36.4 C) (10/22 0700) Pulse Rate:  [29-150] 44 (10/22 0800) Cardiac Rhythm: A-V Sequential paced (10/22 0800) Resp:  [11-23] 19 (10/22 0800) BP: (113-153)/(55-124) 116/58 (10/22 0800) SpO2:  [90 %-100 %] 94 % (10/22 0800) Weight:  [112.5 kg] 112.5 kg (10/22 0500)  Hemodynamic parameters for last 24 hours: CVP:  [9 mmHg-13 mmHg] 11 mmHg  Intake/Output from previous day: 10/21 0701 - 10/22 0700 In: 1318.4 [P.O.:480; I.V.:838.4] Out: 3050 [Urine:3040; Chest Tube:10] Intake/Output this shift: Total I/O In: 421.3 [P.O.:400; I.V.:21.3] Out: -   General appearance: alert and cooperative Neurologic: intact Heart: regularly irregular rhythm Lungs: clear to auscultation bilaterally Extremities: edema 1+ Wound: c/d/i  Lab Results: Recent Labs    12/07/18 0309 12/08/18 0525  WBC 11.6* 9.4  HGB 9.1* 8.2*  HCT 27.6* 26.2*  PLT 86* 83*   BMET:  Recent Labs    12/07/18 1509 12/08/18 0525  NA 136 138  K 4.2 3.9  CL 101 105  CO2 22 24  GLUCOSE 287* 134*  BUN 53* 61*  CREATININE 4.09* 4.13*  CALCIUM 8.3* 8.0*    PT/INR:  Recent Labs    12/05/18 1405  LABPROT 16.2*  INR 1.3*   ABG    Component Value Date/Time   PHART 7.377 12/06/2018 0728   HCO3 22.1 12/06/2018 0728   TCO2 23 12/06/2018 0728   ACIDBASEDEF 3.0 (H) 12/06/2018 0728   O2SAT 90.0 12/06/2018 0728   CBG (last 3)  Recent Labs    12/08/18 0027 12/08/18 0430 12/08/18 0754  GLUCAP 187* 139* 197*    Assessment/Plan: S/P Procedure(s) (LRB): CORONARY ARTERY BYPASS GRAFTING (CABG) X FOUR USING LEFT INTERNAL MAMMARY ARTERY AND RIGHT SAPHENOUS VEIN  GRAFTS (N/A) MAZE (N/A) TRANSESOPHAGEAL ECHOCARDIOGRAM (TEE) (N/A) Clipping Of Atrial Appendage Diuresis continue lasix gtt today; mobilization   LOS: 8 days    Wonda Olds 12/08/2018

## 2018-12-08 NOTE — Progress Notes (Addendum)
The patient has been seen in conjunction with Nathanial Rancher, MD. All aspects of care have been considered and discussed. The patient has been personally interviewed, examined, and all clinical data has been reviewed.   Stable. No pericardial rub.  Progress Note  Patient Name: Bradley Soto Date of Encounter: 12/08/2018  Primary Cardiologist: Mertie Moores, MD   Subjective   He is doing well this morning and denies chest pain or shortness of breath.  Inpatient Medications    Scheduled Meds: . acetaminophen  1,000 mg Oral Q6H   Or  . acetaminophen (TYLENOL) oral liquid 160 mg/5 mL  1,000 mg Per Tube Q6H  . allopurinol  100 mg Oral BID  . amiodarone  400 mg Oral BID  . aspirin EC  325 mg Oral Daily   Or  . aspirin  324 mg Per Tube Daily  . bisacodyl  10 mg Oral Daily   Or  . bisacodyl  10 mg Rectal Daily  . Chlorhexidine Gluconate Cloth  6 each Topical Daily  . darbepoetin (ARANESP) injection - NON-DIALYSIS  100 mcg Subcutaneous Q Wed-1800  . docusate sodium  200 mg Oral Daily  . insulin aspart  0-24 Units Subcutaneous Q4H  . insulin detemir  10 Units Subcutaneous Q12H  . mouth rinse  15 mL Mouth Rinse BID  . metoprolol tartrate  25 mg Oral BID   Or  . metoprolol tartrate  25 mg Per Tube BID  . OLANZapine  5 mg Oral QHS  . pantoprazole  40 mg Oral Daily  . rosuvastatin  20 mg Oral q1800  . sodium chloride flush  10-40 mL Intracatheter Q12H  . sodium chloride flush  3 mL Intravenous Q12H   Continuous Infusions: . sodium chloride Stopped (12/06/18 1009)  . sodium chloride    . sodium chloride 20 mL/hr at 12/05/18 1350  . furosemide (LASIX) infusion 8 mg/hr (12/08/18 0700)  . lactated ringers    . lactated ringers    . lactated ringers 20 mL/hr at 12/08/18 0700   PRN Meds: sodium chloride, lactated ringers, metoprolol tartrate, ondansetron (ZOFRAN) IV, oxyCODONE, sodium chloride flush, sodium chloride flush, traMADol   Vital Signs    Vitals:   12/08/18 0400  12/08/18 0500 12/08/18 0600 12/08/18 0700  BP: 125/63 117/61 127/66 116/63  Pulse: (!) 59 60 (!) 59 (!) 58  Resp: 12 11 14 18   Temp: 98.3 F (36.8 C)     TempSrc: Oral     SpO2: 91% 95% 96% 95%  Weight:  112.5 kg    Height:        Intake/Output Summary (Last 24 hours) at 12/08/2018 0728 Last data filed at 12/08/2018 0700 Gross per 24 hour  Intake 1318.36 ml  Output 2400 ml  Net -1081.64 ml   Filed Weights   12/06/18 0400 12/07/18 0630 12/08/18 0500  Weight: 104.7 kg 117.2 kg 112.5 kg    Telemetry    PVC, atrially paced rhythm- Personally Reviewed  ECG    None today- Personally Reviewed  Physical Exam   GEN: No acute distress.   Cardiac: RRR, no murmurs, rubs, or gallops.  Respiratory: Clear to auscultation bilaterally. MS: No edema; No deformity. Neuro:  Nonfocal  Psych: Normal affect   Labs    Chemistry Recent Labs  Lab 12/05/18 0323  12/07/18 0309 12/07/18 1509 12/08/18 0525  NA 142   < > 139 136 138  K 4.3   < > 4.8 4.2 3.9  CL 114*   < >  106 101 105  CO2 18*   < > 22 22 24   GLUCOSE 80   < > 225* 287* 134*  BUN 44*   < > 48* 53* 61*  CREATININE 3.46*   < > 3.76* 4.09* 4.13*  CALCIUM 8.4*   < > 8.2* 8.3* 8.0*  ALBUMIN 3.1*  --  3.1*  --  2.6*  GFRNONAA 16*   < > 14* 13* 13*  GFRAA 18*   < > 17* 15* 15*  ANIONGAP 10   < > 11 13 9    < > = values in this interval not displayed.     Hematology Recent Labs  Lab 12/06/18 1600 12/07/18 0309 12/08/18 0525  WBC 13.5* 11.6* 9.4  RBC 3.03* 2.85* 2.64*  HGB 9.6* 9.1* 8.2*  HCT 29.2* 27.6* 26.2*  MCV 96.4 96.8 99.2  MCH 31.7 31.9 31.1  MCHC 32.9 33.0 31.3  RDW 15.4 15.6* 15.2  PLT 86* 86* 83*    Cardiac EnzymesNo results for input(s): TROPONINI in the last 168 hours. No results for input(s): TROPIPOC in the last 168 hours.   BNPNo results for input(s): BNP, PROBNP in the last 168 hours.   DDimer No results for input(s): DDIMER in the last 168 hours.   Radiology    Dg Chest Port 1 View   Result Date: 12/08/2018 CLINICAL DATA:  Status post cardiac surgery. EXAM: PORTABLE CHEST 1 VIEW COMPARISON:  December 07, 2018. FINDINGS: Stable cardiomegaly with central pulmonary vascular congestion. No pneumothorax is noted. Right internal jugular venous sheath is unchanged. Left-sided pacemaker is unchanged. Mild bibasilar atelectasis is noted. Bony thorax is unremarkable. IMPRESSION: Stable cardiomegaly with central pulmonary vascular congestion. Mild bibasilar subsegmental atelectasis. Electronically Signed   By: Marijo Conception M.D.   On: 12/08/2018 07:06   Dg Chest Port 1 View  Result Date: 12/07/2018 CLINICAL DATA:  Postop open heart surgery EXAM: PORTABLE CHEST 1 VIEW COMPARISON:  12/06/2018 FINDINGS: Swan-Ganz catheter removed. Right jugular sheath remains in place. Changes of open heart surgery with dual lead pacemaker. Negative for pneumothorax. Left chest tube remains in place. Cardiac enlargement with vascular congestion. Negative for edema. Left lower lobe atelectasis and small left effusion unchanged. Right lung clear. IMPRESSION: Persistent left lower lobe atelectasis and small effusion unchanged. Mild vascular congestion.  No edema. Electronically Signed   By: Franchot Gallo M.D.   On: 12/07/2018 08:38    Cardiac Studies  LHC 10/14  Ramus lesion is 99% stenosed.  Prox LAD to Mid LAD lesion is 70% stenosed.  Dist Cx lesion is 60% stenosed.  Mid RCA lesion is 95% stenosed.  3rd Mrg-1 lesion is 80% stenosed.  3rd Mrg-2 lesion is 99% stenosed.  1st LPL lesion is 99% stenosed.  1. Severe multi-vessel CAD 2. Moderately severe mid LAD stenosis 3. Severe restenosis of the stented segment in the proximal segment of the ramus intermediate branch 4. Severe restenosis mid Circumflex/obtuse marginal stented segment. Severe stenosis in the first obtuse marginal branch beyond the stented segment. Severe stenosis distal Circumflex leading into the moderate caliber second obtuse  marginal branch.  5. Severe stenosis mid RCA  TTE 12/01/2018 1. Left ventricular ejection fraction, by visual estimation, is 60 to 65%. The left ventricle has normal function. Normal left ventricular size. Left ventricular septal wall thickness was mildly increased. Mildly increased left ventricular posterior  wall thickness. There is mildly increased left ventricular hypertrophy. 2. Left ventricular diastolic Doppler parameters are consistent with impaired relaxation pattern of LV diastolic  filling. 3. Global right ventricle has normal systolic function.The right ventricular size is normal. No increase in right ventricular wall thickness. 4. Left atrial size was mildly dilated. 5. Right atrial size was normal. 6. Trivial pericardial effusion is present. 7. The mitral valve is normal in structure. No evidence of mitral valve regurgitation. No evidence of mitral stenosis. 8. The tricuspid valve is normal in structure. Tricuspid valve regurgitation was not visualized by color flow Doppler. 9. The aortic valve is normal in structure. Aortic valve regurgitation was not visualized by color flow Doppler. Structurally normal aortic valve, with no evidence of sclerosis or stenosis. 10. The pulmonic valve was normal in structure. Pulmonic valve regurgitation is not visualized by color flow Doppler. 11. TR signal is inadequate for assessing pulmonary artery systolic pressure. 12. The inferior vena cava is normal in size with <50% respiratory variability, suggesting right atrial pressure of 8 mmHg.  Carotid/ABI Right Carotid: Velocities in the right ICA are consistent with a 40-59% stenosis. The ECA appears >50% stenosed.  Left Carotid: Velocities in the left ICA are consistent with a 1-39% stenosis. The ECA appears >50% stenosed. Vertebrals: Bilateral vertebral arteries demonstrate antegrade flow. Subclavians: Normal flow hemodynamics were seen in bilateral  subclavian arteries.  Right ABI: Resting right ankle-brachial index indicates noncompressible right lower extremity arteries. ABIs are unreliable. RT great toe pressure = 111 mmHg. PT and DP are noncompressible. Left ABI: Resting left ankle-brachial index indicates noncompressible left lower extremity arteries. ABIs are unreliable. LT Great toe pressure = 115 mmHg. PT is noncompressible. DP is unreliable. Right Upper Extremity: Doppler waveforms remain within normal limits with right radial compression. Doppler waveforms decrease >50% with right ulnar compression. Left Upper Extremity: Doppler waveforms remain within normal limits with left radial compression. Doppler waveforms decrease >50% with left ulnar compression.  Patient Profile     Jalani Rominger is a 78 y.o. male with CKD stage IV, diabetes on insulin, sick sinus syndrome status post pacemaker implantation, paroxysmal atrial fibrillation, CAD status post multiple PCI's who was admitted on 10/14, and found to have severe in-stent restenosis with multivessel CAD now status post CABG  Assessment & Plan    #Unstable angina/Chest pain/severe multivessel CAD/in-stent restenosis #Status post CABG x4 POD 3 Continues to remain asymptomatic and is ambulating without any difficulties or symptoms. -Continue aspirin, Crestor, beta-blocker  #Paroxysmal atrial fibrillation on Eliquis at home Review of telemetry shows atrial paced rhythm -Continue amiodarone  #Pulmonary edema His chest x-ray consistent with pulmonary vascular congestion.  Most likely cardiogenic in nature -Continue IV Lasix infusion  #CKD stage IV Renal function continues to worsen.  His creatinine this morning is 4.1 <<4 (baseline ~2.5-2.7).  He continues to make urine about 3L in past 24 hours -Continue IV Lasix infusion -Appreciate assistance from nephrology  #Normocytic anemia likely due to medical renal disease Hemoglobin this morning of 8.2<<9.1  -Continue Aranesp, ferrous fumarate  #Hypertension BP stable -Continue metoprolol  #Hyperlipidemia -Continue Crestor  #Sick sinus syndrome status post pacemaker implantation Telemetry monitor shows an atrially paced rhythm  #Type 2 diabetes mellitus A1c of 8.3 -Continue insulin     For questions or updates, please contact [facility] Please consult [email or web address] for contact info under Cardiology/STEMI.      Signed, Jean Rosenthal, MD  12/08/2018, 7:28 AM

## 2018-12-09 ENCOUNTER — Inpatient Hospital Stay (HOSPITAL_COMMUNITY): Payer: Medicare Other

## 2018-12-09 DIAGNOSIS — I495 Sick sinus syndrome: Secondary | ICD-10-CM

## 2018-12-09 LAB — GLUCOSE, CAPILLARY
Glucose-Capillary: 126 mg/dL — ABNORMAL HIGH (ref 70–99)
Glucose-Capillary: 136 mg/dL — ABNORMAL HIGH (ref 70–99)
Glucose-Capillary: 147 mg/dL — ABNORMAL HIGH (ref 70–99)
Glucose-Capillary: 159 mg/dL — ABNORMAL HIGH (ref 70–99)
Glucose-Capillary: 160 mg/dL — ABNORMAL HIGH (ref 70–99)
Glucose-Capillary: 165 mg/dL — ABNORMAL HIGH (ref 70–99)
Glucose-Capillary: 184 mg/dL — ABNORMAL HIGH (ref 70–99)
Glucose-Capillary: 232 mg/dL — ABNORMAL HIGH (ref 70–99)

## 2018-12-09 LAB — CBC WITH DIFFERENTIAL/PLATELET
Abs Immature Granulocytes: 0.07 10*3/uL (ref 0.00–0.07)
Basophils Absolute: 0 10*3/uL (ref 0.0–0.1)
Basophils Relative: 0 %
Eosinophils Absolute: 0.3 10*3/uL (ref 0.0–0.5)
Eosinophils Relative: 4 %
HCT: 25.9 % — ABNORMAL LOW (ref 39.0–52.0)
Hemoglobin: 8.2 g/dL — ABNORMAL LOW (ref 13.0–17.0)
Immature Granulocytes: 1 %
Lymphocytes Relative: 12 %
Lymphs Abs: 1 10*3/uL (ref 0.7–4.0)
MCH: 31.1 pg (ref 26.0–34.0)
MCHC: 31.7 g/dL (ref 30.0–36.0)
MCV: 98.1 fL (ref 80.0–100.0)
Monocytes Absolute: 0.8 10*3/uL (ref 0.1–1.0)
Monocytes Relative: 10 %
Neutro Abs: 5.8 10*3/uL (ref 1.7–7.7)
Neutrophils Relative %: 73 %
Platelets: 92 10*3/uL — ABNORMAL LOW (ref 150–400)
RBC: 2.64 MIL/uL — ABNORMAL LOW (ref 4.22–5.81)
RDW: 15 % (ref 11.5–15.5)
WBC: 8 10*3/uL (ref 4.0–10.5)
nRBC: 0.3 % — ABNORMAL HIGH (ref 0.0–0.2)

## 2018-12-09 LAB — RENAL FUNCTION PANEL
Albumin: 2.6 g/dL — ABNORMAL LOW (ref 3.5–5.0)
Anion gap: 11 (ref 5–15)
BUN: 67 mg/dL — ABNORMAL HIGH (ref 8–23)
CO2: 24 mmol/L (ref 22–32)
Calcium: 8.1 mg/dL — ABNORMAL LOW (ref 8.9–10.3)
Chloride: 105 mmol/L (ref 98–111)
Creatinine, Ser: 4.62 mg/dL — ABNORMAL HIGH (ref 0.61–1.24)
GFR calc Af Amer: 13 mL/min — ABNORMAL LOW (ref >60)
GFR calc non Af Amer: 11 mL/min — ABNORMAL LOW (ref >60)
Glucose, Bld: 136 mg/dL — ABNORMAL HIGH (ref 70–99)
Phosphorus: 4.6 mg/dL (ref 2.5–4.6)
Potassium: 4.2 mmol/L (ref 3.5–5.1)
Sodium: 140 mmol/L (ref 135–145)

## 2018-12-09 NOTE — Progress Notes (Signed)
Patient was asked to ambulate in the hall this morning, Patient wanted to bathe after 0630 and then walk after breakfast. Education was provided to patient

## 2018-12-09 NOTE — Progress Notes (Signed)
   No arrhythmia.  Creat still increasing.

## 2018-12-09 NOTE — Progress Notes (Signed)
Transferred-in from [facility] awake and alert.Wife at bedside.

## 2018-12-09 NOTE — Progress Notes (Signed)
[facility]    NEPHROLOGY PROGRESS NOTE  SUBJECTIVE: Patient seen and examined this morning.  Ambulating without difficulty.  Anxious to go home.  Denies chest pain, shortness of breath, nausea, vomiting, diarrhea or dysuria.  All other review of systems are negative.     OBJECTIVE:  Vitals:   12/09/18 1200 12/09/18 1300  BP: (!) 159/70 (!) 141/64  Pulse: 60 60  Resp: 19 19  Temp:  98.2 F (36.8 C)  SpO2: 94% 90%    Intake/Output Summary (Last 24 hours) at 12/09/2018 1353 Last data filed at 12/09/2018 0600 Gross per 24 hour  Intake 334 ml  Output 1900 ml  Net -1566 ml      General:  AAOx3 NAD HEENT: MMM [place] AT anicteric sclera Neck:  No JVD, no adenopathy CV:  Heart RRR  Lungs:  L/S CTA bilaterally Abd:  abd SNT/ND with normal BS GU:  Bladder non-palpable Extremities: +1 bilateral lower extremity edema Skin:  No skin rash  MEDICATIONS:  . acetaminophen  1,000 mg Oral Q6H   Or  . acetaminophen (TYLENOL) oral liquid 160 mg/5 mL  1,000 mg Per Tube Q6H  . allopurinol  100 mg Oral BID  . amiodarone  400 mg Oral BID  . aspirin EC  325 mg Oral Daily   Or  . aspirin  324 mg Per Tube Daily  . bisacodyl  10 mg Oral Daily   Or  . bisacodyl  10 mg Rectal Daily  . Chlorhexidine Gluconate Cloth  6 each Topical Daily  . darbepoetin (ARANESP) injection - NON-DIALYSIS  100 mcg Subcutaneous Q Wed-1800  . docusate sodium  200 mg Oral Daily  . Ferrous Fumarate  1 tablet Oral BID  . insulin aspart  0-24 Units Subcutaneous Q4H  . insulin detemir  10 Units Subcutaneous Q12H  . mouth rinse  15 mL Mouth Rinse BID  . metoprolol tartrate  25 mg Oral TID   Or  . metoprolol tartrate  25 mg Per Tube TID  . OLANZapine  5 mg Oral QHS  . pantoprazole  40 mg Oral Daily  . rosuvastatin  20 mg Oral q1800  . sodium chloride flush  10-40 mL Intracatheter Q12H  . sodium chloride flush  3 mL Intravenous Q12H       LABS:   CBC Latest Ref Rng & Units 12/09/2018  12/08/2018 12/07/2018  WBC 4.0 - 10.5 K/uL 8.0 9.4 11.6(H)  Hemoglobin 13.0 - 17.0 g/dL 8.2(L) 8.2(L) 9.1(L)  Hematocrit 39.0 - 52.0 % 25.9(L) 26.2(L) 27.6(L)  Platelets 150 - 400 K/uL 92(L) 83(L) 86(L)    CMP Latest Ref Rng & Units 12/09/2018 12/08/2018 12/07/2018  Glucose 70 - 99 mg/dL 136(H) 134(H) 287(H)  BUN 8 - 23 mg/dL 67(H) 61(H) 53(H)  Creatinine 0.61 - 1.24 mg/dL 4.62(H) 4.13(H) 4.09(H)  Sodium 135 - 145 mmol/L 140 138 136  Potassium 3.5 - 5.1 mmol/L 4.2 3.9 4.2  Chloride 98 - 111 mmol/L 105 105 101  CO2 22 - 32 mmol/L 24 24 22   Calcium 8.9 - 10.3 mg/dL 8.1(L) 8.0(L) 8.3(L)  Total Protein 6.0 - 8.5 g/dL - - -  Total Bilirubin 0.0 - 1.2 mg/dL - - -  Alkaline Phos 39 - 117 IU/L - - -  AST 0 - 40 IU/L - - -  ALT 0 - 44 IU/L - - -    Lab Results  Component Value Date   CALCIUM 8.1 (L) 12/09/2018   CAION 1.14 (L) 12/06/2018   PHOS  4.6 12/09/2018       Component Value Date/Time   COLORURINE YELLOW 12/05/2018 0252   APPEARANCEUR CLEAR 12/05/2018 0252   LABSPEC 1.010 12/05/2018 0252   PHURINE 5.0 12/05/2018 0252   GLUCOSEU NEGATIVE 12/05/2018 0252   HGBUR NEGATIVE 12/05/2018 0252   BILIRUBINUR NEGATIVE 12/05/2018 0252   KETONESUR NEGATIVE 12/05/2018 0252   PROTEINUR 100 (A) 12/05/2018 0252   NITRITE NEGATIVE 12/05/2018 0252   LEUKOCYTESUR NEGATIVE 12/05/2018 0252      Component Value Date/Time   PHART 7.377 12/06/2018 0728   PCO2ART 37.4 12/06/2018 0728   PO2ART 58.0 (L) 12/06/2018 0728   HCO3 22.1 12/06/2018 0728   TCO2 23 12/06/2018 0728   ACIDBASEDEF 3.0 (H) 12/06/2018 0728   O2SAT 90.0 12/06/2018 0728    No results found for: IRON, TIBC, FERRITIN, IRONPCTSAT     ASSESSMENT/PLAN:     Pt is a78 y.o.yo malewith HTN, DM and baseline stage 4 CKD (crt 2.8 to 3.3)who was admitted on10/14/2020with [place] underwent cardiac cath showing severe multivessel CAD, now s/p CABGon 10/19.  1.Renal-Stage 4 CKD at baseline;high risk for requiring  RRTpostoperatively.   Urine output excellent while on furosemide drip, now discontinued.  No urgent indication for dialysis.  Would hold further diuresis.  Electrolytes otherwise stable.  Creatinine is up trending. 2.HTN/vol-1+ pitting edema in lower extremities; lungs clear.   Off Lasix drip due to increasing creatinine. 3. Anemia-Hgb 8.2;follow closely and transfuse PRN; continue ESA.  Check iron stores.      [place], DO, [place]

## 2018-12-09 NOTE — Progress Notes (Signed)
Device checked by industry post surgery. Atrial threshold increased to 3.5V bipolar. Unipolar threshold also increased. Pacing output increased to maintain safety margin. Will re-evaluate in office in 6 weeks. Estimated longevity with increased output is 7 months.  Chanetta Marshall, NP 12/09/2018 9:05 AM

## 2018-12-10 ENCOUNTER — Inpatient Hospital Stay (HOSPITAL_COMMUNITY): Payer: Medicare Other

## 2018-12-10 DIAGNOSIS — Z951 Presence of aortocoronary bypass graft: Secondary | ICD-10-CM

## 2018-12-10 LAB — GLUCOSE, CAPILLARY
Glucose-Capillary: 132 mg/dL — ABNORMAL HIGH (ref 70–99)
Glucose-Capillary: 129 mg/dL — ABNORMAL HIGH (ref 70–99)
Glucose-Capillary: 208 mg/dL — ABNORMAL HIGH (ref 70–99)
Glucose-Capillary: 146 mg/dL — ABNORMAL HIGH (ref 70–99)
Glucose-Capillary: 186 mg/dL — ABNORMAL HIGH (ref 70–99)

## 2018-12-10 LAB — IRON AND TIBC
Iron: 16 ug/dL — ABNORMAL LOW (ref 45–182)
Saturation Ratios: 10 % — ABNORMAL LOW (ref 17.9–39.5)
TIBC: 164 ug/dL — ABNORMAL LOW (ref 250–450)
UIBC: 148 ug/dL

## 2018-12-10 LAB — CBC WITH DIFFERENTIAL/PLATELET
Abs Immature Granulocytes: 0.06 10*3/uL (ref 0.00–0.07)
Basophils Absolute: 0 10*3/uL (ref 0.0–0.1)
Basophils Relative: 0 %
Eosinophils Absolute: 0.5 10*3/uL (ref 0.0–0.5)
Eosinophils Relative: 7 %
HCT: 25.4 % — ABNORMAL LOW (ref 39.0–52.0)
Hemoglobin: 8 g/dL — ABNORMAL LOW (ref 13.0–17.0)
Immature Granulocytes: 1 %
Lymphocytes Relative: 16 %
Lymphs Abs: 1.2 10*3/uL (ref 0.7–4.0)
MCH: 30.7 pg (ref 26.0–34.0)
MCHC: 31.5 g/dL (ref 30.0–36.0)
MCV: 97.3 fL (ref 80.0–100.0)
Monocytes Absolute: 0.8 10*3/uL (ref 0.1–1.0)
Monocytes Relative: 12 %
Neutro Abs: 4.5 10*3/uL (ref 1.7–7.7)
Neutrophils Relative %: 64 %
Platelets: 106 10*3/uL — ABNORMAL LOW (ref 150–400)
RBC: 2.61 MIL/uL — ABNORMAL LOW (ref 4.22–5.81)
RDW: 14.7 % (ref 11.5–15.5)
WBC: 7.1 10*3/uL (ref 4.0–10.5)
nRBC: 0.3 % — ABNORMAL HIGH (ref 0.0–0.2)

## 2018-12-10 LAB — RENAL FUNCTION PANEL
Albumin: 2.4 g/dL — ABNORMAL LOW (ref 3.5–5.0)
Anion gap: 10 (ref 5–15)
BUN: 74 mg/dL — ABNORMAL HIGH (ref 8–23)
CO2: 23 mmol/L (ref 22–32)
Calcium: 8 mg/dL — ABNORMAL LOW (ref 8.9–10.3)
Chloride: 108 mmol/L (ref 98–111)
Creatinine, Ser: 4.4 mg/dL — ABNORMAL HIGH (ref 0.61–1.24)
GFR calc Af Amer: 14 mL/min — ABNORMAL LOW (ref >60)
GFR calc non Af Amer: 12 mL/min — ABNORMAL LOW (ref >60)
Glucose, Bld: 140 mg/dL — ABNORMAL HIGH (ref 70–99)
Phosphorus: 4.7 mg/dL — ABNORMAL HIGH (ref 2.5–4.6)
Potassium: 4.2 mmol/L (ref 3.5–5.1)
Sodium: 141 mmol/L (ref 135–145)

## 2018-12-10 LAB — FERRITIN: Ferritin: 157 ng/mL (ref 24–336)

## 2018-12-10 LAB — PREPARE RBC (CROSSMATCH)

## 2018-12-10 MED ORDER — FUROSEMIDE 10 MG/ML IJ SOLN
60.0000 mg | Freq: Two times a day (BID) | INTRAMUSCULAR | Status: DC
  Administered 2018-12-10 – 2018-12-11 (×2): 60 mg via INTRAVENOUS
  Filled 2018-12-10 (×2): qty 6

## 2018-12-10 MED ORDER — SODIUM CHLORIDE 0.9% IV SOLUTION: Freq: Once | INTRAVENOUS | Status: DC

## 2018-12-10 NOTE — Progress Notes (Signed)
[facility]    NEPHROLOGY PROGRESS NOTE  SUBJECTIVE: Patient seen and examined this morning.  Anxious to go home.  Denies chest pain, shortness of breath, nausea, vomiting, diarrhea or dysuria.  All other review of systems are negative.    OBJECTIVE:  Vitals:   12/10/18 1401 12/10/18 1414  BP:  119/62  Pulse: 62 (!) 59  Resp: 17 18  Temp:  98 F (36.7 C)  SpO2: 93% 92%    Intake/Output Summary (Last 24 hours) at 12/10/2018 1439 Last data filed at 12/10/2018 1401 Gross per 24 hour  Intake 870 ml  Output 1650 ml  Net -780 ml      General:  AAOx3 NAD HEENT: MMM [place] AT anicteric sclera Neck:  No JVD, no adenopathy CV:  Heart RRR  Lungs:  L/S CTA bilaterally Abd:  abd SNT/ND with normal BS GU:  Bladder non-palpable Extremities: +1 bilateral lower extremity edema Skin:  No skin rash  MEDICATIONS:  . sodium chloride   Intravenous Once  . acetaminophen  1,000 mg Oral Q6H   Or  . acetaminophen (TYLENOL) oral liquid 160 mg/5 mL  1,000 mg Per Tube Q6H  . allopurinol  100 mg Oral BID  . amiodarone  400 mg Oral BID  . aspirin EC  325 mg Oral Daily   Or  . aspirin  324 mg Per Tube Daily  . bisacodyl  10 mg Oral Daily   Or  . bisacodyl  10 mg Rectal Daily  . Chlorhexidine Gluconate Cloth  6 each Topical Daily  . darbepoetin (ARANESP) injection - NON-DIALYSIS  100 mcg Subcutaneous Q Wed-1800  . docusate sodium  200 mg Oral Daily  . Ferrous Fumarate  1 tablet Oral BID  . furosemide  60 mg Intravenous BID  . insulin aspart  0-24 Units Subcutaneous Q4H  . insulin detemir  10 Units Subcutaneous Q12H  . mouth rinse  15 mL Mouth Rinse BID  . metoprolol tartrate  25 mg Oral TID   Or  . metoprolol tartrate  25 mg Per Tube TID  . OLANZapine  5 mg Oral QHS  . pantoprazole  40 mg Oral Daily  . rosuvastatin  20 mg Oral q1800  . sodium chloride flush  10-40 mL Intracatheter Q12H       LABS:   CBC Latest Ref Rng & Units 12/10/2018 12/09/2018 12/08/2018  WBC  4.0 - 10.5 K/uL 7.1 8.0 9.4  Hemoglobin 13.0 - 17.0 g/dL 8.0(L) 8.2(L) 8.2(L)  Hematocrit 39.0 - 52.0 % 25.4(L) 25.9(L) 26.2(L)  Platelets 150 - 400 K/uL 106(L) 92(L) 83(L)    CMP Latest Ref Rng & Units 12/10/2018 12/09/2018 12/08/2018  Glucose 70 - 99 mg/dL 140(H) 136(H) 134(H)  BUN 8 - 23 mg/dL 74(H) 67(H) 61(H)  Creatinine 0.61 - 1.24 mg/dL 4.40(H) 4.62(H) 4.13(H)  Sodium 135 - 145 mmol/L 141 140 138  Potassium 3.5 - 5.1 mmol/L 4.2 4.2 3.9  Chloride 98 - 111 mmol/L 108 105 105  CO2 22 - 32 mmol/L 23 24 24   Calcium 8.9 - 10.3 mg/dL 8.0(L) 8.1(L) 8.0(L)  Total Protein 6.0 - 8.5 g/dL - - -  Total Bilirubin 0.0 - 1.2 mg/dL - - -  Alkaline Phos 39 - 117 IU/L - - -  AST 0 - 40 IU/L - - -  ALT 0 - 44 IU/L - - -    Lab Results  Component Value Date   CALCIUM 8.0 (L) 12/10/2018   CAION 1.14 (L) 12/06/2018   PHOS  4.7 (H) 12/10/2018       Component Value Date/Time   COLORURINE YELLOW 12/05/2018 0252   APPEARANCEUR CLEAR 12/05/2018 0252   LABSPEC 1.010 12/05/2018 0252   PHURINE 5.0 12/05/2018 0252   GLUCOSEU NEGATIVE 12/05/2018 0252   HGBUR NEGATIVE 12/05/2018 0252   BILIRUBINUR NEGATIVE 12/05/2018 0252   KETONESUR NEGATIVE 12/05/2018 0252   PROTEINUR 100 (A) 12/05/2018 0252   NITRITE NEGATIVE 12/05/2018 0252   LEUKOCYTESUR NEGATIVE 12/05/2018 0252      Component Value Date/Time   PHART 7.377 12/06/2018 0728   PCO2ART 37.4 12/06/2018 0728   PO2ART 58.0 (L) 12/06/2018 0728   HCO3 22.1 12/06/2018 0728   TCO2 23 12/06/2018 0728   ACIDBASEDEF 3.0 (H) 12/06/2018 0728   O2SAT 90.0 12/06/2018 0728       Component Value Date/Time   IRON 16 (L) 12/10/2018 0246   TIBC 164 (L) 12/10/2018 0246   FERRITIN 157 12/10/2018 0246   IRONPCTSAT 10 (L) 12/10/2018 0246       ASSESSMENT/PLAN:     Pt is a7 y.o.yo malewith HTN, DM and baseline stage 4 CKD (crt 2.8 to 3.3)who was admitted on10/14/2020with [place] underwent cardiac cath showing severe multivessel CAD, now s/p CABGon  10/19.  1.Renal-Stage 4 CKD at baseline; now with acute kidney injury related to hemodynamics. Urine output excellent while on furosemide drip, now discontinued.  No urgent indication for dialysis.  With holding of diuretics, serum creatinine has stabilized.  Electrolytes otherwise stable.  Okay for disposition from renal standpoint.  Needs follow-up in the office in 1 to 2 weeks. 2.HTN/vol-1+ pitting edema in lower extremities; lungs clear.   Off Lasix drip due to increasing creatinine. 3. Anemia-Hgb 8.2;follow closely and transfuse PRN; continue ESA.  Is iron replete.  Continue oral iron.     [place], DO, [place]

## 2018-12-10 NOTE — Progress Notes (Signed)
Pt placed on CPAP at a pressure of 8 cm H2O and a 2LT O2 bleed in. tol well at this time

## 2018-12-10 NOTE — Progress Notes (Signed)
      [address]       ,[place] [address]             [phone]      5 Days Post-Op Procedure(s) (LRB): CORONARY ARTERY BYPASS GRAFTING (CABG) X FOUR USING LEFT INTERNAL MAMMARY ARTERY AND RIGHT SAPHENOUS VEIN GRAFTS (N/A) MAZE (N/A) TRANSESOPHAGEAL ECHOCARDIOGRAM (TEE) (N/A) Clipping Of Atrial Appendage Subjective: Feels okay this morning.   Objective: Vital signs in last 24 hours: Temp:  [97.5 F (36.4 C)-99 F (37.2 C)] 97.8 F (36.6 C) (10/24 1112) Pulse Rate:  [58-72] 58 (10/24 0735) Cardiac Rhythm: Atrial paced (10/24 0700) Resp:  [11-19] 13 (10/24 1112) BP: (120-169)/(59-88) 148/70 (10/24 1112) SpO2:  [90 %-97 %] 90 % (10/24 0735) FiO2 (%):  [2 %] 2 % (10/23 1600) Weight:  [113.9 kg] 113.9 kg (10/24 0641)    Intake/Output from previous day: 10/23 0701 - 10/24 0700 In: 380 [P.O.:380] Out: 1250 [Urine:1250] Intake/Output this shift: No intake/output data recorded.  General appearance: alert, cooperative and no distress Heart: regular rate and rhythm, S1, S2 normal, no murmur, click, rub or gallop and A-paced Lungs: clear to auscultation bilaterally Abdomen: soft, non-tender; bowel sounds normal; no masses,  no organomegaly Extremities: 1-2+ pitting edema in the lower extremity bilaterally Wound: clean and dry  Lab Results: Recent Labs    12/09/18 0410 12/10/18 0246  WBC 8.0 7.1  HGB 8.2* 8.0*  HCT 25.9* 25.4*  PLT 92* 106*   BMET:  Recent Labs    12/09/18 0410 12/10/18 0246  NA 140 141  K 4.2 4.2  CL 105 108  CO2 24 23  GLUCOSE 136* 140*  BUN 67* 74*  CREATININE 4.62* 4.40*  CALCIUM 8.1* 8.0*    PT/INR: No results for input(s): LABPROT, INR in the last 72 hours. ABG    Component Value Date/Time   PHART 7.377 12/06/2018 0728   HCO3 22.1 12/06/2018 0728   TCO2 23 12/06/2018 0728   ACIDBASEDEF 3.0 (H) 12/06/2018 0728   O2SAT 90.0 12/06/2018 0728   CBG (last 3)  Recent Labs    12/09/18 2337 12/10/18 0249 12/10/18  0806  GLUCAP 160* 132* 129*    Assessment/Plan: S/P Procedure(s) (LRB): CORONARY ARTERY BYPASS GRAFTING (CABG) X FOUR USING LEFT INTERNAL MAMMARY ARTERY AND RIGHT SAPHENOUS VEIN GRAFTS (N/A) MAZE (N/A) TRANSESOPHAGEAL ECHOCARDIOGRAM (TEE) (N/A) Clipping Of Atrial Appendage  1. CV-BP well controlled. A-paced through a PPM 2. Pulm-CPAP at night. Room air with good oxygen saturation during the day 3. Renal-creatinine 4.40, electrolytes okay. Nephrology following. Good urine output 4. Endo-blood glucose well controlled-continue current regimen 5. H and H 8.0/25.4, transfuse 1 unit of pRBC  Plan: Push and pull effect with blood and lasix. Appreciate Nephrology's assistance. Wires out today. Possibly home tomorrow.     LOS: 10 days    [name] 12/10/2018

## 2018-12-10 NOTE — Progress Notes (Addendum)
CARDIAC REHAB PHASE I   [phone] Patient currently receiving blood. No ambulation with CR. Per MD documentation, patient may go home tomorrow. Discharge education completed including activity progression and restrictions, diet, sternal precautions, IS, and phase 2 CR. Patient is unsure if he would like to participate in CR however he is agreeable to order being placed to [facility] CR. Patient states wife has [facility] booklet and has reviewed thoroughly. Encouraged patient to ambulate later today if OK with bedside RN. Patient is not interested in VCR.  Ashlee Bewley Minus Breeding RN, BSN

## 2018-12-11 LAB — TYPE AND SCREEN
ABO/RH(D): O POS
Antibody Screen: NEGATIVE
Unit division: 0

## 2018-12-11 LAB — CBC WITH DIFFERENTIAL/PLATELET
Abs Immature Granulocytes: 0.1 10*3/uL — ABNORMAL HIGH (ref 0.00–0.07)
Basophils Absolute: 0 10*3/uL (ref 0.0–0.1)
Basophils Relative: 0 %
Eosinophils Absolute: 0.5 10*3/uL (ref 0.0–0.5)
Eosinophils Relative: 7 %
HCT: 29.7 % — ABNORMAL LOW (ref 39.0–52.0)
Hemoglobin: 9.6 g/dL — ABNORMAL LOW (ref 13.0–17.0)
Immature Granulocytes: 1 %
Lymphocytes Relative: 15 %
Lymphs Abs: 1.1 10*3/uL (ref 0.7–4.0)
MCH: 30.9 pg (ref 26.0–34.0)
MCHC: 32.3 g/dL (ref 30.0–36.0)
MCV: 95.5 fL (ref 80.0–100.0)
Monocytes Absolute: 0.9 10*3/uL (ref 0.1–1.0)
Monocytes Relative: 12 %
Neutro Abs: 4.7 10*3/uL (ref 1.7–7.7)
Neutrophils Relative %: 65 %
Platelets: 121 10*3/uL — ABNORMAL LOW (ref 150–400)
RBC: 3.11 MIL/uL — ABNORMAL LOW (ref 4.22–5.81)
RDW: 15.6 % — ABNORMAL HIGH (ref 11.5–15.5)
WBC: 7.3 10*3/uL (ref 4.0–10.5)
nRBC: 0.4 % — ABNORMAL HIGH (ref 0.0–0.2)

## 2018-12-11 LAB — GLUCOSE, CAPILLARY
Glucose-Capillary: 137 mg/dL — ABNORMAL HIGH (ref 70–99)
Glucose-Capillary: 223 mg/dL — ABNORMAL HIGH (ref 70–99)
Glucose-Capillary: 196 mg/dL — ABNORMAL HIGH (ref 70–99)

## 2018-12-11 LAB — RENAL FUNCTION PANEL
Albumin: 2.5 g/dL — ABNORMAL LOW (ref 3.5–5.0)
Anion gap: 11 (ref 5–15)
BUN: 81 mg/dL — ABNORMAL HIGH (ref 8–23)
CO2: 23 mmol/L (ref 22–32)
Calcium: 8.2 mg/dL — ABNORMAL LOW (ref 8.9–10.3)
Chloride: 108 mmol/L (ref 98–111)
Creatinine, Ser: 4.43 mg/dL — ABNORMAL HIGH (ref 0.61–1.24)
GFR calc Af Amer: 14 mL/min — ABNORMAL LOW (ref >60)
GFR calc non Af Amer: 12 mL/min — ABNORMAL LOW (ref >60)
Glucose, Bld: 140 mg/dL — ABNORMAL HIGH (ref 70–99)
Phosphorus: 5 mg/dL — ABNORMAL HIGH (ref 2.5–4.6)
Potassium: 4.4 mmol/L (ref 3.5–5.1)
Sodium: 142 mmol/L (ref 135–145)

## 2018-12-11 LAB — BPAM RBC
Blood Product Expiration Date: 202010272359
ISSUE DATE / TIME: 202010241347
Unit Type and Rh: 9500

## 2018-12-11 MED ORDER — METOPROLOL TARTRATE 25 MG PO TABS: 25.0000 mg | ORAL_TABLET | Freq: Three times a day (TID) | ORAL | 1 refills | Status: DC

## 2018-12-11 MED ORDER — ROSUVASTATIN CALCIUM 20 MG PO TABS: 20.0000 mg | ORAL_TABLET | Freq: Every day | ORAL | 1 refills | Status: DC

## 2018-12-11 MED ORDER — ASPIRIN 325 MG PO TBEC: 325.0000 mg | DELAYED_RELEASE_TABLET | Freq: Every day | ORAL | 0 refills | Status: DC

## 2018-12-11 MED ORDER — OXYCODONE HCL 5 MG PO TABS: 5.0000 mg | ORAL_TABLET | Freq: Four times a day (QID) | ORAL | 0 refills | Status: DC | PRN

## 2018-12-11 MED ORDER — AMIODARONE HCL 200 MG PO TABS: 200.0000 mg | ORAL_TABLET | Freq: Two times a day (BID) | ORAL | 1 refills | Status: DC

## 2018-12-11 NOTE — Progress Notes (Signed)
Patient put himself on CPAP.

## 2018-12-11 NOTE — Progress Notes (Signed)
      Bradley Soto [address]       Bradley Soto [address]             [phone]      6 Days Post-Op Procedure(s) (LRB): CORONARY ARTERY BYPASS GRAFTING (CABG) X FOUR USING LEFT INTERNAL MAMMARY ARTERY AND RIGHT SAPHENOUS VEIN GRAFTS (N/A) MAZE (N/A) TRANSESOPHAGEAL ECHOCARDIOGRAM (TEE) (N/A) Clipping Of Atrial Appendage Subjective: No issues this morning. Ready to go home.   Objective: Vital signs in last 24 hours: Temp:  [97.7 F (36.5 C)-98.9 F (37.2 C)] 98.4 F (36.9 C) (10/25 1059) Pulse Rate:  [58-63] 58 (10/25 0700) Cardiac Rhythm: Atrial paced;Bundle branch block (10/25 0800) Resp:  [14-20] 20 (10/25 1059) BP: (119-150)/(59-76) 150/72 (10/25 1059) SpO2:  [90 %-97 %] 93 % (10/25 0700) Weight:  [112.9 kg] 112.9 kg (10/25 0311)     Intake/Output from previous day: 10/24 0701 - 10/25 0700 In: 1045 [P.O.:480; I.V.:250; Blood:315] Out: 2225 [Urine:2225] Intake/Output this shift: Total I/O In: 360 [P.O.:360] Out: 300 [Urine:300]  General appearance: alert, cooperative and no distress Heart: regular rate and rhythm, S1, S2 normal, no murmur, click, rub or gallop Lungs: clear to auscultation bilaterally Abdomen: soft, non-tender; bowel sounds normal; no masses,  no organomegaly Extremities: extremities normal, atraumatic, no cyanosis or edema Wound: clean and dry  Lab Results: Recent Labs    12/10/18 0246 12/11/18 0254  WBC 7.1 7.3  HGB 8.0* 9.6*  HCT 25.4* 29.7*  PLT 106* 121*   BMET:  Recent Labs    12/10/18 0246 12/11/18 0254  NA 141 142  K 4.2 4.4  CL 108 108  CO2 23 23  GLUCOSE 140* 140*  BUN 74* 81*  CREATININE 4.40* 4.43*  CALCIUM 8.0* 8.2*    PT/INR: No results for input(s): LABPROT, INR in the last 72 hours. ABG    Component Value Date/Time   PHART 7.377 12/06/2018 0728   HCO3 22.1 12/06/2018 0728   TCO2 23 12/06/2018 0728   ACIDBASEDEF 3.0 (H) 12/06/2018 0728   O2SAT 90.0 12/06/2018 0728   CBG (last 3)  Recent Labs   12/11/18 0346 12/11/18 0810 12/11/18 1101  GLUCAP 137* 223* 196*    Assessment/Plan: S/P Procedure(s) (LRB): CORONARY ARTERY BYPASS GRAFTING (CABG) X FOUR USING LEFT INTERNAL MAMMARY ARTERY AND RIGHT SAPHENOUS VEIN GRAFTS (N/A) MAZE (N/A) TRANSESOPHAGEAL ECHOCARDIOGRAM (TEE) (N/A) Clipping Of Atrial Appendage  1. CV-BP well controlled. A-paced through a PPM 2. Pulm-CPAP at night. Room air with good oxygen saturation during the day 3. Renal-creatinine 4.43, electrolytes okay. Nephrology following. Good urine output 4. Endo-blood glucose well controlled-continue current regimen 5. H and H 9.6/29.7, s/p transfuse 1 unit of pRBC  Plan: discharge today. All questions answered and discharge instructions given.    LOS: 11 days    Bradley Soto 12/11/2018

## 2018-12-13 ENCOUNTER — Telehealth (HOSPITAL_COMMUNITY): Payer: Self-pay

## 2018-12-13 ENCOUNTER — Telehealth: Payer: Self-pay | Admitting: *Deleted

## 2018-12-13 NOTE — Telephone Encounter (Signed)
Bradley Soto is concerned with his amiodarone dose on discharge, why he isn't on Eliquis and that he is having an irregular heartbeat.  The on call doctor, Dr. Cyndia Bent, responded to the call regarding heartbeat.  He advised that if it was not racing no treatment was necessary. He has a follow up this Friday to see Dr. Orvan Seen.  I suggested he consult his cardiologist, Dr. [name], to inquire about the medication concerns. I let him know that cardiology saw him in the hospital and made treatment recommendations.

## 2018-12-13 NOTE — Telephone Encounter (Signed)
Attempted to call patient in regards to Cardiac Rehab - LM on VM 

## 2018-12-13 NOTE — Telephone Encounter (Signed)
Pt insurance is active and benefits verified through Medicare A/B. Co-pay $0.00, DED $198.00/$198.00 met, out of pocket $0.00/$0.00 met, co-insurance 20%. No pre-authorization required. Passport, 12/13/2018 @ 10:21AM, [phone]  2ndary insurance is active and benefits verified through [employer]. Co-pay $0.00, DED $0.00/$0.00 met, out of pocket $0.00/$0.00 met, co-insurance 0%. No pre-authorization required. Passport, 12/13/2018 @ 10:35AM, VAC#[id number]  Will contact patient to see if he is interested in the Cardiac Rehab Program. If interested, patient will need to complete follow up appt. Once completed, patient will be contacted for scheduling upon review by the RN Navigator.

## 2018-12-14 ENCOUNTER — Other Ambulatory Visit: Payer: Self-pay | Admitting: Cardiothoracic Surgery

## 2018-12-14 DIAGNOSIS — Z951 Presence of aortocoronary bypass graft: Secondary | ICD-10-CM

## 2018-12-16 ENCOUNTER — Other Ambulatory Visit: Payer: Self-pay | Admitting: *Deleted

## 2018-12-16 ENCOUNTER — Ambulatory Visit (INDEPENDENT_AMBULATORY_CARE_PROVIDER_SITE_OTHER): Payer: Self-pay | Admitting: Cardiothoracic Surgery

## 2018-12-16 ENCOUNTER — Other Ambulatory Visit: Payer: Self-pay | Admitting: Cardiothoracic Surgery

## 2018-12-16 ENCOUNTER — Other Ambulatory Visit: Payer: Self-pay

## 2018-12-16 ENCOUNTER — Ambulatory Visit
Admission: RE | Admit: 2018-12-16 | Discharge: 2018-12-16 | Disposition: A | Discharge status: Home / Self Care | Payer: Medicare Other | Source: Ambulatory Visit | Attending: Cardiothoracic Surgery | Admitting: Cardiothoracic Surgery

## 2018-12-16 VITALS — BP 160/67 | HR 75 | Temp 98.1°F | Resp 20 | Ht 71.0 in | Wt 249.0 lb

## 2018-12-16 DIAGNOSIS — Z951 Presence of aortocoronary bypass graft: Secondary | ICD-10-CM

## 2018-12-16 DIAGNOSIS — J90 Pleural effusion, not elsewhere classified: Secondary | ICD-10-CM

## 2018-12-16 DIAGNOSIS — I2510 Atherosclerotic heart disease of native coronary artery without angina pectoris: Secondary | ICD-10-CM

## 2018-12-16 DIAGNOSIS — I509 Heart failure, unspecified: Secondary | ICD-10-CM | POA: Diagnosis not present

## 2018-12-16 DIAGNOSIS — I130 Hypertensive heart and chronic kidney disease with heart failure and stage 1 through stage 4 chronic kidney disease, or unspecified chronic kidney disease: Secondary | ICD-10-CM | POA: Diagnosis not present

## 2018-12-16 DIAGNOSIS — I480 Paroxysmal atrial fibrillation: Secondary | ICD-10-CM | POA: Diagnosis not present

## 2018-12-16 DIAGNOSIS — D62 Acute posthemorrhagic anemia: Secondary | ICD-10-CM | POA: Diagnosis not present

## 2018-12-16 DIAGNOSIS — N184 Chronic kidney disease, stage 4 (severe): Secondary | ICD-10-CM | POA: Diagnosis not present

## 2018-12-16 DIAGNOSIS — E1165 Type 2 diabetes mellitus with hyperglycemia: Secondary | ICD-10-CM | POA: Diagnosis not present

## 2018-12-16 DIAGNOSIS — Z794 Long term (current) use of insulin: Secondary | ICD-10-CM | POA: Diagnosis not present

## 2018-12-16 DIAGNOSIS — R7401 Elevation of levels of liver transaminase levels: Secondary | ICD-10-CM | POA: Diagnosis not present

## 2018-12-16 DIAGNOSIS — G4733 Obstructive sleep apnea (adult) (pediatric): Secondary | ICD-10-CM | POA: Diagnosis not present

## 2018-12-16 DIAGNOSIS — N179 Acute kidney failure, unspecified: Secondary | ICD-10-CM | POA: Diagnosis not present

## 2018-12-16 DIAGNOSIS — I25728 Atherosclerosis of autologous artery coronary artery bypass graft(s) with other forms of angina pectoris: Secondary | ICD-10-CM | POA: Diagnosis not present

## 2018-12-19 ENCOUNTER — Telehealth: Payer: Self-pay | Admitting: Cardiovascular Disease

## 2018-12-19 ENCOUNTER — Other Ambulatory Visit (HOSPITAL_COMMUNITY)
Admission: RE | Admit: 2018-12-19 | Discharge: 2018-12-19 | Disposition: A | Discharge status: Home / Self Care | Payer: Medicare Other | Source: Ambulatory Visit | Attending: Cardiothoracic Surgery | Admitting: Cardiothoracic Surgery

## 2018-12-19 DIAGNOSIS — J90 Pleural effusion, not elsewhere classified: Secondary | ICD-10-CM

## 2018-12-19 DIAGNOSIS — Z01812 Encounter for preprocedural laboratory examination: Secondary | ICD-10-CM | POA: Diagnosis not present

## 2018-12-19 DIAGNOSIS — Z20828 Contact with and (suspected) exposure to other viral communicable diseases: Secondary | ICD-10-CM | POA: Insufficient documentation

## 2018-12-19 LAB — SARS CORONAVIRUS 2 (TAT 6-24 HRS): SARS Coronavirus 2: NEGATIVE

## 2018-12-19 NOTE — Telephone Encounter (Signed)
Spoke with patient's wife regarding patient's upcoming appointments. I advised that he will need to see either Dr. [name] or his PA for his post surgery visit since we will continue to manage his care after his last f/u with the surgeon. She requests to see Dr. [name] on Friday 11/6 and date and address were verified. I explained the importance also for the device check on 12/7 with Oda Kilts, PA and she verbalized understanding and agreement. The appointment for 11/24 with [facility], PA was made prior to patient's surgery and I advised it will be cancelled. She states she will need to accompany patient to appointments and thanked me for the call.

## 2018-12-19 NOTE — Telephone Encounter (Signed)
Patients wife does not want husband to have as many appt's as he does. She wants to cancel his appt with Robbie Lis on 12/22/18 as well as the 01/23/19 appt with Joesph July, he just recently had CABG and does not feel comfortable with him going out so much. Please advise

## 2018-12-21 ENCOUNTER — Other Ambulatory Visit: Payer: Self-pay

## 2018-12-21 ENCOUNTER — Ambulatory Visit (HOSPITAL_COMMUNITY)
Admission: RE | Admit: 2018-12-21 | Discharge: 2018-12-21 | Disposition: A | Discharge status: Home / Self Care | Payer: Medicare Other | Source: Ambulatory Visit | Attending: Cardiothoracic Surgery | Admitting: Cardiothoracic Surgery

## 2018-12-21 ENCOUNTER — Other Ambulatory Visit: Payer: Self-pay | Admitting: Cardiothoracic Surgery

## 2018-12-21 ENCOUNTER — Encounter (HOSPITAL_COMMUNITY): Payer: Self-pay | Admitting: Student

## 2018-12-21 DIAGNOSIS — J948 Other specified pleural conditions: Secondary | ICD-10-CM | POA: Diagnosis not present

## 2018-12-21 DIAGNOSIS — J9811 Atelectasis: Secondary | ICD-10-CM | POA: Diagnosis not present

## 2018-12-21 DIAGNOSIS — Z951 Presence of aortocoronary bypass graft: Secondary | ICD-10-CM

## 2018-12-21 DIAGNOSIS — R846 Abnormal cytological findings in specimens from respiratory organs and thorax: Secondary | ICD-10-CM | POA: Insufficient documentation

## 2018-12-21 DIAGNOSIS — J90 Pleural effusion, not elsewhere classified: Secondary | ICD-10-CM | POA: Diagnosis not present

## 2018-12-21 HISTORY — PX: IR THORACENTESIS ASP PLEURAL SPACE W/IMG GUIDE: IMG5380

## 2018-12-21 LAB — GRAM STAIN

## 2018-12-21 MED ORDER — LIDOCAINE HCL 1 % IJ SOLN
INTRAMUSCULAR | Status: AC
  Filled 2018-12-21: qty 20

## 2018-12-21 MED ORDER — LIDOCAINE HCL 1 % IJ SOLN
INTRAMUSCULAR | Status: AC | PRN
  Administered 2018-12-21: 10 mL

## 2018-12-21 NOTE — Procedures (Signed)
PROCEDURE SUMMARY:  Successful US guided diagnostic and therapeutic thoracentesis. Yielded 1.4 liters of red, amber fluid. Pt tolerated procedure well. No immediate complications.  Specimen was sent for labs. CXR ordered.  EBL < 5 mL  Docia Barrier PA-C 12/21/2018 10:22 AM

## 2018-12-22 ENCOUNTER — Ambulatory Visit: Payer: Medicare Other | Admitting: Physician Assistant

## 2018-12-22 LAB — CYTOLOGY - NON PAP

## 2018-12-23 ENCOUNTER — Other Ambulatory Visit: Payer: Self-pay | Admitting: Cardiothoracic Surgery

## 2018-12-23 ENCOUNTER — Ambulatory Visit (INDEPENDENT_AMBULATORY_CARE_PROVIDER_SITE_OTHER): Payer: Medicare Other | Admitting: Cardiovascular Disease

## 2018-12-23 ENCOUNTER — Encounter: Payer: Self-pay | Admitting: Cardiovascular Disease

## 2018-12-23 ENCOUNTER — Other Ambulatory Visit: Payer: Self-pay

## 2018-12-23 VITALS — BP 150/92 | HR 63 | Ht 71.0 in | Wt 243.0 lb

## 2018-12-23 DIAGNOSIS — I10 Essential (primary) hypertension: Secondary | ICD-10-CM | POA: Diagnosis not present

## 2018-12-23 DIAGNOSIS — I200 Unstable angina: Secondary | ICD-10-CM

## 2018-12-23 DIAGNOSIS — I2510 Atherosclerotic heart disease of native coronary artery without angina pectoris: Secondary | ICD-10-CM | POA: Diagnosis not present

## 2018-12-23 DIAGNOSIS — Z951 Presence of aortocoronary bypass graft: Secondary | ICD-10-CM

## 2018-12-23 DIAGNOSIS — I480 Paroxysmal atrial fibrillation: Secondary | ICD-10-CM

## 2018-12-23 MED ORDER — HYDRALAZINE HCL 25 MG PO TABS: 25.0000 mg | ORAL_TABLET | Freq: Three times a day (TID) | ORAL | 11 refills | Status: DC

## 2018-12-23 MED ORDER — AMIODARONE HCL 200 MG PO TABS: 200.0000 mg | ORAL_TABLET | Freq: Every day | ORAL | 3 refills | Status: DC

## 2018-12-23 NOTE — Progress Notes (Signed)
      [address]       [place] [address]             [phone]     CARDIOTHORACIC SURGERY OFFICE NOTE  Referring Provider is Nahser, [name], MD Primary Cardiologist is Mertie Moores, MD PCP is [name], MD   HPI:  78 yo man returns for 1st visit in office after recent hospitalization for NSTEMI and underwent CABG. His condition is complicated by CKD with baseline sCr near 3. Has been doing well overall, but he is a little fatigued. Denies chest pain but does have mild shortness of breath.    Current Outpatient Medications  Medication Sig Dispense Refill  . allopurinol (ZYLOPRIM) 100 MG tablet Take 100 mg by mouth 2 (two) times daily.    Marland Kitchen amiodarone (PACERONE) 200 MG tablet Take 1 tablet (200 mg total) by mouth 2 (two) times daily. 60 tablet 1  . aspirin EC 325 MG EC tablet Take 1 tablet (325 mg total) by mouth daily. 30 tablet 0  . Cholecalciferol (VITAMIN D-3) 25 MCG (1000 UT) CAPS Take 1,000 Units by mouth 2 (two) times daily.     . furosemide (LASIX) 40 MG tablet Take 40 mg by mouth daily.    . metoprolol tartrate (LOPRESSOR) 25 MG tablet Take 1 tablet (25 mg total) by mouth 3 (three) times daily. 90 tablet 1  . NOVOLOG MIX 70/30 FLEXPEN (70-30) 100 UNIT/ML FlexPen Inject 35 Units into the skin 2 (two) times daily with a meal.   3  . oxyCODONE (OXY IR/ROXICODONE) 5 MG immediate release tablet Take 1 tablet (5 mg total) by mouth every 6 (six) hours as needed for severe pain. 30 tablet 0  . rosuvastatin (CRESTOR) 20 MG tablet Take 1 tablet (20 mg total) by mouth daily at 6 PM. 30 tablet 1   No current facility-administered medications for this visit.       Physical Exam:   BP (!) 160/67   Pulse 75   Temp 98.1 F (36.7 C) (Skin)   Resp 20   Ht 5\' 11"  (1.803 m)   Wt 112.9 kg   BMI 34.73 kg/m   General:  Well-appearing, NAD  Chest:   Decreased BS at left base  CV:   rrr  Incisions:  C/d/i  Abdomen:  sntnd  Extremities:  2+ edema   Diagnostic Tests:  CXR w/left effusion   Impression:  Doing reasonably well after CABG  Plan:  Referral for left thoracentesis Refer to Drs. Patwardhan/Ganji for ong-term cardiology coverage Continue diuretic therapy F/u in 2-3 weeks  I spent in excess of 15 minutes during the conduct of this office consultation and >50% of this time involved direct face-to-face encounter with the patient for counseling and/or coordination of their care.  Level 2                 10 minutes Level 3                 15 minutes Level 4                 25 minutes Level 5                 40 minutes  B. Murvin Natal, MD 12/23/2018 9:16 AM

## 2018-12-23 NOTE — Patient Instructions (Addendum)
Medication Instructions:  Your physician has recommended you make the following change in your medication:  DECREASE Amiodarone to 200 mg once daily START Hydralazine 25 mg three times daily  *If you need a refill on your cardiac medications before your next appointment, please call your pharmacy*   Lab Work: None Ordered    Testing/Procedures: None Ordered   Follow-Up: At [facility], you and your health needs are our priority.  As part of our continuing mission to provide you with exceptional heart care, we have created designated Provider Care Teams.  These Care Teams include your primary Cardiologist (physician) and Advanced Practice Providers (APPs -  Physician Assistants and Nurse Practitioners) who all work together to provide you with the care you need, when you need it.  Your next appointment:   4 weeks on Wed. Dec. 9 at 11:40 am  The format for your next appointment:   In Person  Provider:   Mertie Moores, MD

## 2018-12-23 NOTE — Progress Notes (Signed)
Bradley Soto Date of Birth  1940-10-29       [facility]    [employer] [address], [address], [place], [place]  [address]   [employer] [place], [place]  [address] [address]   Fax  [phone]     Fax [phone]  Problem List: 1. Coronary artery disease-status post stenting 2. Sick sinus syndrome-status post pacemaker. History of paroxysmal  atrial fib 3. CKD 4. Diabetes Mellitus 5. Gout 6.   Previoius Hx  Pt recently moved from [place], [place] ( near [employer])  Here to establish cardiology care. 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    March 25, 2017: [name] is seen today for a follow-up visit.  I last saw him 4 years ago.  He has been seeing Dr. Rayann Heman in the meantime.  Hx of stenting in the past  Was just in the hospital - had the flu.  ( did get the flu shot)  Echocardiogram while in the hospital shows normal left ventricular systolic function with ejection fraction of 60-65%.  He has mild left ventricular hypertrophy.  There is akinesis of the inferior basilar segment.  He has grade 1 diastolic.  dysfunction.  There is mild aortic stenosis.  Now has  No CP . Has some shortness of breath ,  Worse in the cold.   Has gained some weight - since being on Insulin   Jan. 29, 2020   Doing well  He describes having some palpitations that lasted about a month.  He thinks they started around mid December and lasted through the first part of January. It is him as needed for these palpitations.  They seem to have resolved.  He was wondering if they are related to his Tylenol PM. Today shows atrial pacing.  There is no arrhythmias. Is not exercising   Oct. 12, 2020   Bradley Soto is seen today for follow up . Hx of previous CAD and previous coronary stenting .   Bradley Soto was seen in the ER on Oct. 8 but left the waiting room because of prolonged wait time. Has mid  sternal chest pain . Seems worse with movement,  No radiation.  Did not have NTG to take   CP started a week ago .    Has occurred 3-4 times.  Woke up with CP last night Typically occurs at night while sleeping .  Does have CP while walking to the mailbox   Will last for several minutes.   Center of chest,  Radiates to his throat.  This pain is very similar to his previous episodes of CP  Pain was relieved with SL NTG   Not associated with deep breath , not associated with twisting his body .    December 23, 2018: Bradley Soto is seen today for a follow-up visit.  He was having some chest pain when I saw him last month and we referred him for heart catheterization.   Ramus lesion is 99% stenosed.  Prox LAD to Mid LAD lesion is 70% stenosed.  Dist Cx lesion is 60% stenosed.  Mid RCA lesion is 95% stenosed.  3rd Mrg-1 lesion is 80% stenosed.  3rd Mrg-2 lesion is 99% stenosed.  1st LPL lesion is 99% stenosed.   1. Severe multi-vessel CAD 2. Moderately severe mid LAD stenosis 3. Severe restenosis of the stented segment in the proximal segment of  the ramus intermediate branch 4. Severe restenosis mid Circumflex/obtuse marginal stented segment. Severe stenosis in the first obtuse marginal branch beyond the stented segment. Severe stenosis distal Circumflex leading into the moderate caliber second obtuse marginal branch.  5. Severe stenosis mid RCA  Had coronary artery bypass grafting-LIMA to LAD, SVG to RI  - OM3, SVT to D1 The distal right coronary artery/posterior descending branch was evaluated but was considered to be too small for bypass grafting. Is healing   He has had some recurrent pleural effusions and had a thoracentesis 2 days ago.   1.4 liters   The procedure was done in special procedures/interventional radiology on November 4.  He had some postoperative atrial fibrillation and is now on amiodarone  He was previously on amlodipine 2.5 mg a day, hydralazine 25 mg 3  times a day, isosorbide 30 mg a day and Eliquis.  These medications have been discontinued.  His simvastatin was discontinued but now he is on Crestor 20 mg a day.  Current Outpatient Medications  Medication Sig Dispense Refill  . allopurinol (ZYLOPRIM) 100 MG tablet Take 100 mg by mouth 2 (two) times daily.    Marland Kitchen amiodarone (PACERONE) 200 MG tablet Take 1 tablet (200 mg total) by mouth 2 (two) times daily. 60 tablet 1  . aspirin EC 325 MG EC tablet Take 1 tablet (325 mg total) by mouth daily. 30 tablet 0  . Cholecalciferol (VITAMIN D-3) 25 MCG (1000 UT) CAPS Take 1,000 Units by mouth 2 (two) times daily.     . furosemide (LASIX) 40 MG tablet Take 40 mg by mouth daily.    . metoprolol tartrate (LOPRESSOR) 25 MG tablet Take 1 tablet (25 mg total) by mouth 3 (three) times daily. 90 tablet 1  . NOVOLOG MIX 70/30 FLEXPEN (70-30) 100 UNIT/ML FlexPen Inject 35 Units into the skin 2 (two) times daily with a meal.   3  . oxyCODONE (OXY IR/ROXICODONE) 5 MG immediate release tablet Take 1 tablet (5 mg total) by mouth every 6 (six) hours as needed for severe pain. 30 tablet 0  . rosuvastatin (CRESTOR) 20 MG tablet Take 1 tablet (20 mg total) by mouth daily at 6 PM. 30 tablet 1   No current facility-administered medications for this visit.      Allergies  Allergen Reactions  . Shellfish Allergy Rash    Past Medical History:  Diagnosis Date  . CAD (coronary artery disease)    s/p stent placement  . CKD (chronic kidney disease), stage III   . Colon polyps   . Diabetes mellitus without complication ([place])   . Gout   . HLD (hyperlipidemia)   . HTN (hypertension)   . Insomnia   . MI (myocardial infarction) ([place]) 1998  . NSTEMI (non-ST elevated myocardial infarction) ([place])    s/p PCI in early 2000s  . OSA on CPAP   . SSS (sick sinus syndrome) ([place])   . Type II or unspecified type diabetes mellitus without mention of complication, not stated as uncontrolled     Past Surgical History:  Procedure  Laterality Date  . APPENDECTOMY    . CATARACT EXTRACTION    . CLIPPING OF ATRIAL APPENDAGE  12/05/2018   Procedure: Clipping Of Atrial Appendage;  Surgeon: Wonda Olds, MD;  Location: [facility] OR;  Service: Open Heart Surgery;;  . CORONARY ANGIOPLASTY WITH STENT PLACEMENT     x4  . CORONARY ARTERY BYPASS GRAFT N/A 12/05/2018   Procedure: CORONARY ARTERY BYPASS GRAFTING (CABG) X FOUR  USING LEFT INTERNAL MAMMARY ARTERY AND RIGHT SAPHENOUS VEIN GRAFTS;  Surgeon: Wonda Olds, MD;  Location: [facility];  Service: Open Heart Surgery;  Laterality: N/A;  . HERNIA REPAIR     ventral hernia repair with mesh--revision also was required  . INSERT / REPLACE / REMOVE PACEMAKER  05/10/2000   pacemaker implanted in [place]  . IR THORACENTESIS ASP PLEURAL SPACE W/IMG GUIDE  12/21/2018  . LEFT HEART CATH AND CORONARY ANGIOGRAPHY N/A 11/30/2018   Procedure: LEFT HEART CATH AND CORONARY ANGIOGRAPHY;  Surgeon: Burnell Blanks, MD;  Location: [facility];  Service: Cardiovascular;  Laterality: N/A;  . MAZE N/A 12/05/2018   Procedure: MAZE;  Surgeon: Wonda Olds, MD;  Location: [facility];  Service: Open Heart Surgery;  Laterality: N/A;  . PACEMAKER GENERATOR CHANGE  09/03/09   MDT Adapta generator change in [place]  . PARTIAL COLON REMOVAL  2/2   polyp that could not be removed on colonoscopy  . TEE WITHOUT CARDIOVERSION N/A 12/05/2018   Procedure: TRANSESOPHAGEAL ECHOCARDIOGRAM (TEE);  Surgeon: Wonda Olds, MD;  Location: [facility];  Service: Open Heart Surgery;  Laterality: N/A;    Social History   Tobacco Use  Smoking Status Never Smoker  Smokeless Tobacco Never Used    Social History   Substance and Sexual Activity  Alcohol Use No    Family History  Problem Relation Age of Onset  . Heart attack Father 40  . Heart disease Father   . Breast cancer Sister   . Pulmonary embolism Sister   . Deep vein thrombosis Sister     Reviw of Systems:  Reviewed in the HPI.  All other  systems are negative.  Physical Exam: Blood pressure (!) 150/92, pulse 63, height 5\' 11"  (1.803 m), weight 243 lb (110.2 kg), SpO2 97 %.  GEN:  Middle age male,  HEENT: Normal NECK: No JVD; No carotid bruits LYMPHATICS: No lymphadenopathy CARDIAC: RRR , thoracostomy sites are healing fairly well.  Sternotomy scar is healing well well.  He does have some scab formation.   RESPIRATORY:  Clear to auscultation without rales, wheezing or rhonchi  ABDOMEN: Soft, non-tender, non-distended MUSCULOSKELETAL:  No edema; No deformity  SKIN: Warm and dry NEUROLOGIC:  Alert and oriented x 3   ECG:     Assessment / Plan:   1.  Coronary artery disease: Had a heart catheterization which revealed severe three-vessel coronary artery disease.  He had coronary artery bypass grafting to his LAD, circumflex artery, diagonal vessel.  The right coronary artery was too small to consider bypassing.  He seems to be making good progress.  He is having lots of chest soreness.  We reviewed his weight lifting limitations for the next 6 months.   2.  Chronic diastolic congestive heart failure:  Advised salt restriction .     3.  Hypertension:  BP slithgly up today .   We will restart hydralazine 25 mg 3 times a day.  4.  Chronic renal insufficiency: Followed by nephrology.  5.  Paroxysmal atrial fibrillation:   His amiodarone was increased to 200 mg twice a day during his hospitalization.  I am suspicious that he had postoperative atrial fibrillation although I  have not seen this documented. At present he is a pacing.  We will reduce his amiodarone back down to 200 mg once a day.  At some point, I would like to restart his Eliquis.  At present he is on aspirin 325 mg a day.  It is possible that the surgeons wanted to wait and make sure that he did not need to have any additional procedures such as thoracentesis.  He will see Oda Kilts in the EP department in approximately 1 month.  If he is found to have  episodes of underlying atrial fibrillation I think will be very important to restart his Eliquis soon.  I will see him in 4 to 6 weeks and will get decision to restart his Eliquis at that time if we have not already done so.    Mertie Moores, MD  12/23/2018 11:51 AM    [address] [place],  [place], [place]  [address] Pager [phone] Phone: [phone]; Fax: [phone]) [phone]   .

## 2018-12-26 ENCOUNTER — Other Ambulatory Visit: Payer: Self-pay | Admitting: Cardiothoracic Surgery

## 2018-12-26 ENCOUNTER — Ambulatory Visit
Admission: RE | Admit: 2018-12-26 | Discharge: 2018-12-26 | Disposition: A | Discharge status: Home / Self Care | Payer: Medicare Other | Source: Ambulatory Visit | Attending: Cardiothoracic Surgery | Admitting: Cardiothoracic Surgery

## 2018-12-26 ENCOUNTER — Encounter: Payer: Self-pay | Admitting: Physician Assistant

## 2018-12-26 ENCOUNTER — Other Ambulatory Visit: Payer: Self-pay

## 2018-12-26 ENCOUNTER — Other Ambulatory Visit: Payer: Self-pay | Admitting: *Deleted

## 2018-12-26 ENCOUNTER — Ambulatory Visit (INDEPENDENT_AMBULATORY_CARE_PROVIDER_SITE_OTHER): Payer: Self-pay | Admitting: Physician Assistant

## 2018-12-26 VITALS — BP 167/83 | HR 63 | Temp 97.5°F | Resp 16 | Ht 71.0 in | Wt 243.0 lb

## 2018-12-26 DIAGNOSIS — I2510 Atherosclerotic heart disease of native coronary artery without angina pectoris: Secondary | ICD-10-CM

## 2018-12-26 DIAGNOSIS — J90 Pleural effusion, not elsewhere classified: Secondary | ICD-10-CM | POA: Diagnosis not present

## 2018-12-26 DIAGNOSIS — Z951 Presence of aortocoronary bypass graft: Secondary | ICD-10-CM

## 2018-12-26 LAB — CULTURE, BODY FLUID W GRAM STAIN -BOTTLE: Culture: NO GROWTH

## 2018-12-26 NOTE — Patient Instructions (Signed)
Will need repeat left thoracentesis

## 2018-12-26 NOTE — Progress Notes (Signed)
HPI:  Patient returns for follow up after having had a left thoracentesis on 12/21/2018. Patient had a CABG x 4 and LA clip on 12/05/2018. He was seen in routine follow up by Dr. Orvan Seen on 10/30 and was found to have a left pleural effusion. He underwent an outpatient left thoracentesis and 1.4 L of red, amber fluid was removed. Patient has had more shortness of breath with exertion of late. He also has complaints of fatigue.   Current Outpatient Medications  Medication Sig Dispense Refill  . allopurinol (ZYLOPRIM) 100 MG tablet Take 100 mg by mouth 2 (two) times daily.    Marland Kitchen amiodarone (PACERONE) 200 MG tablet Take 1 tablet (200 mg total) by mouth daily. 90 tablet 3  . aspirin EC 325 MG EC tablet Take 1 tablet (325 mg total) by mouth daily. 30 tablet 0  . Cholecalciferol (VITAMIN D-3) 25 MCG (1000 UT) CAPS Take 1,000 Units by mouth 2 (two) times daily.     . furosemide (LASIX) 40 MG tablet Take 40 mg by mouth daily.    . hydrALAZINE (APRESOLINE) 25 MG tablet Take 1 tablet (25 mg total) by mouth 3 (three) times daily. 90 tablet 11  . metoprolol tartrate (LOPRESSOR) 25 MG tablet Take 1 tablet (25 mg total) by mouth 3 (three) times daily. 90 tablet 1  . NOVOLOG MIX 70/30 FLEXPEN (70-30) 100 UNIT/ML FlexPen Inject 35 Units into the skin 2 (two) times daily with a meal.   3  . oxyCODONE (OXY IR/ROXICODONE) 5 MG immediate release tablet Take 1 tablet (5 mg total) by mouth every 6 (six) hours as needed for severe pain. 30 tablet 0  . rosuvastatin (CRESTOR) 20 MG tablet Take 1 tablet (20 mg total) by mouth daily at 6 PM. 30 tablet 1  Vital Signs: BP 167/83, HR 63, RR 16, Oxygen saturation 95% on room air   Physical Exam: CV-RRR Pulmonary-Clear to auscultation on the right and diminished left base Extremities-Trace LE edema Wounds- Clean and dry. Derma bond starting to peel off on sternal wound. Eschars at chest tube wounds.  Diagnostic Tests: CLINICAL DATA:  Postop CABG  12/05/2018  EXAM: CHEST - 2 VIEW  COMPARISON:  12/21/2018  FINDINGS: Progression of left pleural effusion mild-to-moderate in size. No pneumothorax. Progressive left lower lobe atelectasis.  Negative for heart failure. Right lung clear. Pacemaker unchanged. Left atrial appendage clip remains in satisfactory position  IMPRESSION: Mild to moderate left pleural effusion and left lower lobe atelectasis has progressed in the interval. No pneumothorax. Negative for heart failure.   Electronically Signed   By: Franchot Gallo M.D.   On: 12/26/2018 13:14  Impression and Plan: Patient has re accumulation of left pleural effusion. He will need another left thoracentesis. Our office will arrange this as an outpatient. He is hypertensive on this office visit. Patient states he just started taking Hydralazine today. I did discuss that we may need to restart Amlodipine if his BP continues to be elevated. Of note, his HR is in the low 60's. We may need to decrease Lopressor or stop Amiodarone. Patient has a low affect and I had a discussion with him that depression is common after heart surgery. I offered encouragement but if he is still not feeling "quite right", he could see his primary care physician.  We will discuss him driving and participating in cardiac rehab after next office visit. He was instructed to continue with sternal pre cautions (no more lifting more than 10 pounds).  Patient will  return to see Dr. Orvan Seen on 11/20 with PA/LAT CXR. Patient has an appointment with EP for pacer check on 12/07 and to see Dr. [name] on 12/9. Per Dr. Elmarie Shiley last office visit note, he will decide about restarting patient on Apixaban (if has underlying a fib on pacer interrogation).    Nani Skillern, PA-C [facility] [phone]

## 2018-12-27 ENCOUNTER — Other Ambulatory Visit (HOSPITAL_COMMUNITY)
Admission: RE | Admit: 2018-12-27 | Discharge: 2018-12-27 | Disposition: A | Discharge status: Home / Self Care | Payer: Medicare Other | Source: Ambulatory Visit | Attending: Cardiothoracic Surgery | Admitting: Cardiothoracic Surgery

## 2018-12-27 DIAGNOSIS — Z01812 Encounter for preprocedural laboratory examination: Secondary | ICD-10-CM | POA: Diagnosis not present

## 2018-12-27 DIAGNOSIS — Z20828 Contact with and (suspected) exposure to other viral communicable diseases: Secondary | ICD-10-CM | POA: Insufficient documentation

## 2018-12-29 LAB — NOVEL CORONAVIRUS, NAA (HOSP ORDER, SEND-OUT TO REF LAB; TAT 18-24 HRS): SARS-CoV-2, NAA: NOT DETECTED

## 2018-12-30 ENCOUNTER — Ambulatory Visit (HOSPITAL_COMMUNITY)
Admission: RE | Admit: 2018-12-30 | Discharge: 2018-12-30 | Disposition: A | Discharge status: Home / Self Care | Payer: Medicare Other | Source: Ambulatory Visit | Attending: Cardiothoracic Surgery | Admitting: Cardiothoracic Surgery

## 2018-12-30 ENCOUNTER — Encounter (HOSPITAL_COMMUNITY): Payer: Self-pay | Admitting: Student

## 2018-12-30 ENCOUNTER — Other Ambulatory Visit (HOSPITAL_COMMUNITY): Payer: Self-pay | Admitting: Student

## 2018-12-30 ENCOUNTER — Ambulatory Visit (HOSPITAL_COMMUNITY)
Admission: RE | Admit: 2018-12-30 | Discharge: 2018-12-30 | Disposition: A | Discharge status: Home / Self Care | Payer: Medicare Other | Source: Ambulatory Visit | Attending: Student | Admitting: Student

## 2018-12-30 ENCOUNTER — Other Ambulatory Visit: Payer: Self-pay

## 2018-12-30 DIAGNOSIS — J90 Pleural effusion, not elsewhere classified: Secondary | ICD-10-CM | POA: Diagnosis not present

## 2018-12-30 HISTORY — PX: IR THORACENTESIS ASP PLEURAL SPACE W/IMG GUIDE: IMG5380

## 2018-12-30 MED ORDER — LIDOCAINE HCL 1 % IJ SOLN
INTRAMUSCULAR | Status: AC
  Filled 2018-12-30: qty 20

## 2018-12-30 MED ORDER — LIDOCAINE HCL 1 % IJ SOLN
INTRAMUSCULAR | Status: DC | PRN
  Administered 2018-12-30: 10 mL

## 2018-12-30 NOTE — Procedures (Signed)
PROCEDURE SUMMARY:  Successful US guided therapeutic left thoracentesis. Yielded 800 mL of amber fluid. Pt tolerated procedure well. No immediate complications.  Specimen was not sent for labs. CXR ordered.  EBL < 5 mL  Docia Barrier PA-C 12/30/2018 10:51 AM

## 2019-01-03 ENCOUNTER — Other Ambulatory Visit: Payer: Self-pay | Admitting: Physician Assistant

## 2019-01-05 ENCOUNTER — Other Ambulatory Visit: Payer: Self-pay | Admitting: Cardiothoracic Surgery

## 2019-01-05 DIAGNOSIS — Z951 Presence of aortocoronary bypass graft: Secondary | ICD-10-CM

## 2019-01-06 ENCOUNTER — Ambulatory Visit (INDEPENDENT_AMBULATORY_CARE_PROVIDER_SITE_OTHER): Payer: Self-pay | Admitting: Cardiothoracic Surgery

## 2019-01-06 ENCOUNTER — Ambulatory Visit
Admission: RE | Admit: 2019-01-06 | Discharge: 2019-01-06 | Disposition: A | Discharge status: Home / Self Care | Payer: Medicare Other | Source: Ambulatory Visit | Attending: Cardiothoracic Surgery | Admitting: Cardiothoracic Surgery

## 2019-01-06 ENCOUNTER — Other Ambulatory Visit: Payer: Self-pay

## 2019-01-06 VITALS — BP 126/73 | HR 65 | Temp 97.5°F | Resp 16 | Ht 71.0 in | Wt 235.6 lb

## 2019-01-06 DIAGNOSIS — J90 Pleural effusion, not elsewhere classified: Secondary | ICD-10-CM

## 2019-01-06 DIAGNOSIS — I2510 Atherosclerotic heart disease of native coronary artery without angina pectoris: Secondary | ICD-10-CM

## 2019-01-06 DIAGNOSIS — Z951 Presence of aortocoronary bypass graft: Secondary | ICD-10-CM

## 2019-01-06 MED ORDER — METOPROLOL TARTRATE 25 MG PO TABS: 37.5000 mg | ORAL_TABLET | Freq: Two times a day (BID) | ORAL | 11 refills | Status: DC

## 2019-01-06 NOTE — Progress Notes (Signed)
      [address]       [place] [address]             [phone]     CARDIOTHORACIC SURGERY OFFICE NOTE  Referring Provider is Nahser, [name], MD Primary Cardiologist is Mertie Moores, MD PCP is [name], MD   HPI:  78 yo s/p CABG. Presents for 2nd f/u. Reports increased activity tolerated reasonably well.  Underwent L thoracentesis x 2; occasionally dizzy/fatigued   Current Outpatient Medications  Medication Sig Dispense Refill  . allopurinol (ZYLOPRIM) 100 MG tablet Take 100 mg by mouth 2 (two) times daily.    Marland Kitchen amiodarone (PACERONE) 200 MG tablet Take 1 tablet (200 mg total) by mouth daily. 90 tablet 3  . aspirin EC 325 MG EC tablet Take 1 tablet (325 mg total) by mouth daily. 30 tablet 0  . Cholecalciferol (VITAMIN D-3) 25 MCG (1000 UT) CAPS Take 1,000 Units by mouth 2 (two) times daily.     . furosemide (LASIX) 40 MG tablet Take 40 mg by mouth daily.    . hydrALAZINE (APRESOLINE) 25 MG tablet Take 1 tablet (25 mg total) by mouth 3 (three) times daily. (Patient taking differently: Take 25 mg by mouth 3 (three) times daily. 2 IN THE AM, 1 IN THE M) 90 tablet 11  . NOVOLOG MIX 70/30 FLEXPEN (70-30) 100 UNIT/ML FlexPen Inject 35 Units into the skin 2 (two) times daily with a meal.   3  . rosuvastatin (CRESTOR) 20 MG tablet Take 1 tablet (20 mg total) by mouth daily at 6 PM. 30 tablet 1  . metoprolol tartrate (LOPRESSOR) 25 MG tablet Take 1.5 tablets (37.5 mg total) by mouth 2 (two) times daily. 60 tablet 11   No current facility-administered medications for this visit.       Physical Exam:   BP 126/73 (BP Location: Right Arm)   Pulse 65   Temp (!) 97.5 F (36.4 C) (Skin)   Resp 16   Ht 5\' 11"  (1.803 m)   Wt 106.9 kg   SpO2 94% Comment: RA  BMI 32.86 kg/m   General:  Well-appearing, NAD  Chest:   cta  CV:   rrr  Incisions:  C/d/i  Abdomen:  nt  Extremities:  No edema  Diagnostic Tests:  CxR with small left effusion    Impression:  Doing well after CABG, making good progress  Plan:  ASA 81 mg/d Change metoprolol to 37.5 mg BID Change hydralazine to 25 mg BID Ok to stop amiodarone  I spent in excess of 15 minutes during the conduct of this office consultation and >50% of this time involved direct face-to-face encounter with the patient for counseling and/or coordination of their care.  Level 2                 10 minutes Level 3                 15 minutes Level 4                 25 minutes Level 5                 40 minutes  B. Murvin Natal, MD 01/06/2019 11:56 AM

## 2019-01-09 ENCOUNTER — Telehealth (HOSPITAL_COMMUNITY): Payer: Self-pay | Admitting: *Deleted

## 2019-01-09 NOTE — Telephone Encounter (Signed)
-----   Message from Thayer Headings, MD sent at 01/08/2019  4:10 PM EST ----- Regarding: RE: Ok to participate in group exercise with high covid risk score I think he would benefit from participating in facility cardiac rehab ----- Message ----- From: Rowe Pavy, RN Sent: 01/06/2019   2:45 PM EST To: Thayer Headings, MD Subject: Ok to participate in group exercise with hig#  Dr. [name],   We are seeing patients on-site for cardiac rehab . A great deal of planning with advisement from our Medical Director - Dr. Radford Pax, CV Service line leadership, Infection Disease Control, Facilities, security, recommendations from American Association of Cardiac and Pulmonary Rehab (AACVPR) with the goal for optimal patient safety. Patients will have strict guidelines and criteria they must adhere to and follow. Patients will wear a mask during exercise and practice social distancing. Patients will have to complete screening prior to entry into gym area.   Your patient expressed great interest in participating in facility cardiac rehab. Patient has a high COVID-19 risk score of 8. Do you feel this patient is appropriate to resume exercise in facility cardiac rehab? Any additional restrictions you feel are appropriate for this patient?   Pt completed follow up with you on 11/6 and Dr. Orvan Seen 11/20 Tentatively looking to schedule after seen on 12/7 by Jonni Sanger and 12/9 with you   Thank you and we appreciate your input  Maurice Small RN, BSN Cardiac and Pulmonary Rehab Nurse Navigator   Cardiac Rehab Staff

## 2019-01-10 ENCOUNTER — Ambulatory Visit: Payer: Medicare Other | Admitting: Physician Assistant

## 2019-01-20 DIAGNOSIS — I129 Hypertensive chronic kidney disease with stage 1 through stage 4 chronic kidney disease, or unspecified chronic kidney disease: Secondary | ICD-10-CM | POA: Diagnosis not present

## 2019-01-20 DIAGNOSIS — N189 Chronic kidney disease, unspecified: Secondary | ICD-10-CM | POA: Diagnosis not present

## 2019-01-20 DIAGNOSIS — I2510 Atherosclerotic heart disease of native coronary artery without angina pectoris: Secondary | ICD-10-CM | POA: Diagnosis not present

## 2019-01-20 DIAGNOSIS — N184 Chronic kidney disease, stage 4 (severe): Secondary | ICD-10-CM | POA: Diagnosis not present

## 2019-01-20 DIAGNOSIS — R82998 Other abnormal findings in urine: Secondary | ICD-10-CM | POA: Diagnosis not present

## 2019-01-20 DIAGNOSIS — D631 Anemia in chronic kidney disease: Secondary | ICD-10-CM | POA: Diagnosis not present

## 2019-01-20 DIAGNOSIS — N2581 Secondary hyperparathyroidism of renal origin: Secondary | ICD-10-CM | POA: Diagnosis not present

## 2019-01-20 DIAGNOSIS — M109 Gout, unspecified: Secondary | ICD-10-CM | POA: Diagnosis not present

## 2019-01-23 ENCOUNTER — Encounter: Payer: Medicare Other | Admitting: Student

## 2019-01-25 ENCOUNTER — Ambulatory Visit (INDEPENDENT_AMBULATORY_CARE_PROVIDER_SITE_OTHER): Payer: Medicare Other | Admitting: Cardiovascular Disease

## 2019-01-25 ENCOUNTER — Encounter: Payer: Self-pay | Admitting: Cardiovascular Disease

## 2019-01-25 ENCOUNTER — Other Ambulatory Visit: Payer: Self-pay

## 2019-01-25 VITALS — BP 114/66 | HR 78 | Ht 71.0 in | Wt 233.0 lb

## 2019-01-25 DIAGNOSIS — Z951 Presence of aortocoronary bypass graft: Secondary | ICD-10-CM | POA: Diagnosis not present

## 2019-01-25 DIAGNOSIS — I200 Unstable angina: Secondary | ICD-10-CM | POA: Diagnosis not present

## 2019-01-25 DIAGNOSIS — I10 Essential (primary) hypertension: Secondary | ICD-10-CM | POA: Diagnosis not present

## 2019-01-25 DIAGNOSIS — I2510 Atherosclerotic heart disease of native coronary artery without angina pectoris: Secondary | ICD-10-CM | POA: Diagnosis not present

## 2019-01-25 NOTE — Patient Instructions (Signed)
Medication Instructions:  Your physician has recommended you make the following change in your medication:  STOP Lasix (furosemide)   *If you need a refill on your cardiac medications before your next appointment, please call your pharmacy*  Lab Work: None Ordered   Testing/Procedures: None Ordered   Follow-Up: At [facility], you and your health needs are our priority.  As part of our continuing mission to provide you with exceptional heart care, we have created designated Provider Care Teams.  These Care Teams include your primary Cardiologist (physician) and Advanced Practice Providers (APPs -  Physician Assistants and Nurse Practitioners) who all work together to provide you with the care you need, when you need it.  Your next appointment:   6 month(s)  The format for your next appointment:   Either In Person or Virtual  Provider:   Richardson Dopp, PA-C, Robbie Lis, PA-C or Daune Perch, NP

## 2019-01-25 NOTE — Progress Notes (Signed)
Bradley Soto Date of Birth  06/18/1940       [facility]    [employer] [address], [place], [place]  [address]   [place], [place]  [address] [address]   Fax  [phone]     Fax [phone]  Problem List: 1. Coronary artery disease-status post stenting 2. Sick sinus syndrome-status post pacemaker. History of paroxysmal  atrial fib 3. CKD 4. Diabetes Mellitus 5. Gout 6.   Previoius Hx  Pt recently moved from [place], [place] ( near [employer])  Here to establish cardiology care. 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    March 25, 2017: Bradley Soto is seen today for a follow-up visit.  I last saw him 4 years ago.  He has been seeing Dr. Rayann Heman in the meantime.  Hx of stenting in the past  Was just in the hospital - had the flu.  ( did get the flu shot)  Echocardiogram while in the hospital shows normal left ventricular systolic function with ejection fraction of 60-65%.  He has mild left ventricular hypertrophy.  There is akinesis of the inferior basilar segment.  He has grade 1 diastolic.  dysfunction.  There is mild aortic stenosis.  Now has  No CP . Has some shortness of breath ,  Worse in the cold.   Has gained some weight - since being on Insulin   Jan. 29, 2020   Doing well  He describes having some palpitations that lasted about a month.  He thinks they started around mid December and lasted through the first part of January. It is him as needed for these palpitations.  They seem to have resolved.  He was wondering if they are related to his Tylenol PM. Today shows atrial pacing.  There is no arrhythmias. Is not exercising   Oct. 12, 2020   Bradley Soto is seen today for follow up . Hx of previous CAD and previous coronary stenting .   Breaker was seen in the ER on Oct. 8 but left the waiting room because of prolonged wait time. Has mid  sternal chest pain . Seems worse with movement,  No radiation.  Did not have NTG to take   CP started a week ago .    Has occurred 3-4 times.  Woke up with CP last night Typically occurs at night while sleeping .  Does have CP while walking to the mailbox   Will last for several minutes.   Center of chest,  Radiates to his throat.  This pain is very similar to his previous episodes of CP  Pain was relieved with SL NTG   Not associated with deep breath , not associated with twisting his body .    December 23, 2018: Bradley Soto is seen today for a follow-up visit.  He was having some chest pain when I saw him last month and we referred him for heart catheterization.   Ramus lesion is 99% stenosed.  Prox LAD to Mid LAD lesion is 70% stenosed.  Dist Cx lesion is 60% stenosed.  Mid RCA lesion is 95% stenosed.  3rd Mrg-1 lesion is 80% stenosed.  3rd Mrg-2 lesion is 99% stenosed.  1st LPL lesion is 99% stenosed.   1. Severe multi-vessel CAD 2. Moderately severe mid LAD stenosis 3. Severe restenosis of the stented segment in the proximal segment of  the ramus intermediate branch 4. Severe restenosis mid Circumflex/obtuse marginal stented segment. Severe stenosis in the first obtuse marginal branch beyond the stented segment. Severe stenosis distal Circumflex leading into the moderate caliber second obtuse marginal branch.  5. Severe stenosis mid RCA  Had coronary artery bypass grafting-LIMA to LAD, SVG to RI  - OM3, SVT to D1 The distal right coronary artery/posterior descending branch was evaluated but was considered to be too small for bypass grafting. Is healing   He has had some recurrent pleural effusions and had a thoracentesis 2 days ago.   1.4 liters   The procedure was done in special procedures/interventional radiology on November 4.  He had some postoperative atrial fibrillation and is now on amiodarone  He was previously on amlodipine 2.5 mg a day, hydralazine 25 mg 3  times a day, isosorbide 30 mg a day and Eliquis.  These medications have been discontinued.  His simvastatin was discontinued but now he is on Crestor 20 mg a day.  January 25, 2019:  Bradley Soto is seen today for follow-up visit.  He has a history of coronary artery disease and is status post coronary artery bypass grafting ( Oct. 19,. 2020 )   He has a pacemaker. Breathing is going ok Slight DOE ,  No fever,    No angina    Current Outpatient Medications  Medication Sig Dispense Refill  . allopurinol (ZYLOPRIM) 100 MG tablet Take 100 mg by mouth 2 (two) times daily.    Marland Kitchen aspirin EC 81 MG tablet Take 81 mg by mouth daily.    . Cholecalciferol (VITAMIN D-3) 25 MCG (1000 UT) CAPS Take 1,000 Units by mouth 2 (two) times daily.     . furosemide (LASIX) 40 MG tablet Take 40 mg by mouth daily as needed for fluid or edema.     . hydrALAZINE (APRESOLINE) 25 MG tablet Take 25 mg by mouth 2 (two) times daily.    . metoprolol tartrate (LOPRESSOR) 25 MG tablet Take 1.5 tablets (37.5 mg total) by mouth 2 (two) times daily. 60 tablet 11  . NOVOLOG MIX 70/30 FLEXPEN (70-30) 100 UNIT/ML FlexPen Inject 35 Units into the skin 2 (two) times daily with a meal.   3  . rosuvastatin (CRESTOR) 20 MG tablet Take 1 tablet (20 mg total) by mouth daily at 6 PM. 30 tablet 1   No current facility-administered medications for this visit.      Allergies  Allergen Reactions  . Shellfish Allergy Rash    Past Medical History:  Diagnosis Date  . CAD (coronary artery disease)    s/p stent placement  . CKD (chronic kidney disease), stage III   . Colon polyps   . Diabetes mellitus without complication ([place])   . Gout   . HLD (hyperlipidemia)   . HTN (hypertension)   . Insomnia   . MI (myocardial infarction) ([place]) 1998  . NSTEMI (non-ST elevated myocardial infarction) ([place])    s/p PCI in early 2000s  . OSA on CPAP   . SSS (sick sinus syndrome) ([place])   . Type II or unspecified type diabetes mellitus without mention  of complication, not stated as uncontrolled     Past Surgical History:  Procedure Laterality Date  . APPENDECTOMY    . CATARACT EXTRACTION    . CLIPPING OF ATRIAL APPENDAGE  12/05/2018   Procedure: Clipping Of Atrial Appendage;  Surgeon: Wonda Olds, MD;  [facility];  Service: Open Heart Surgery;;  . CORONARY ANGIOPLASTY WITH  STENT PLACEMENT     x4  . CORONARY ARTERY BYPASS GRAFT N/A 12/05/2018   Procedure: CORONARY ARTERY BYPASS GRAFTING (CABG) X FOUR USING LEFT INTERNAL MAMMARY ARTERY AND RIGHT SAPHENOUS VEIN GRAFTS;  Surgeon: Wonda Olds, MD;  Location: [facility];  Service: Open Heart Surgery;  Laterality: N/A;  . HERNIA REPAIR     ventral hernia repair with mesh--revision also was required  . INSERT / REPLACE / REMOVE PACEMAKER  05/10/2000   pacemaker implanted in [place]  . IR THORACENTESIS ASP PLEURAL SPACE W/IMG GUIDE  12/21/2018  . IR THORACENTESIS ASP PLEURAL SPACE W/IMG GUIDE  12/30/2018  . LEFT HEART CATH AND CORONARY ANGIOGRAPHY N/A 11/30/2018   Procedure: LEFT HEART CATH AND CORONARY ANGIOGRAPHY;  Surgeon: Burnell Blanks, MD;  Location: [facility] CV LAB;  Service: Cardiovascular;  Laterality: N/A;  . MAZE N/A 12/05/2018   Procedure: MAZE;  Surgeon: Wonda Olds, MD;  Location: [facility];  Service: Open Heart Surgery;  Laterality: N/A;  . PACEMAKER GENERATOR CHANGE  09/03/09   MDT Adapta generator change in [place]  . PARTIAL COLON REMOVAL  2/2   polyp that could not be removed on colonoscopy  . TEE WITHOUT CARDIOVERSION N/A 12/05/2018   Procedure: TRANSESOPHAGEAL ECHOCARDIOGRAM (TEE);  Surgeon: Wonda Olds, MD;  Location: [facility];  Service: Open Heart Surgery;  Laterality: N/A;    Social History   Tobacco Use  Smoking Status Never Smoker  Smokeless Tobacco Never Used    Social History   Substance and Sexual Activity  Alcohol Use No    Family History  Problem Relation Age of Onset  . Heart attack Father 75  . Heart disease Father    . Breast cancer Sister   . Pulmonary embolism Sister   . Deep vein thrombosis Sister     Reviw of Systems:  Reviewed in the HPI.  All other systems are negative.  Physical Exam: Blood pressure 114/66, pulse 78, height 5\' 11"  (1.803 m), weight 233 lb (105.7 kg), SpO2 96 %.  GEN: Elderly gentleman, no acute distress.   HEENT: Normal NECK: No JVD; No carotid bruits LYMPHATICS: No lymphadenopathy CARDIAC: RRR , no murmurs, rubs, gallops, sternal scar appears to be healing well.  Thoracostomy sites are well-healed. RESPIRATORY:  Clear to auscultation without rales, wheezing or rhonchi  ABDOMEN: Soft, non-tender, non-distended MUSCULOSKELETAL:  No edema; No deformity  SKIN: Warm and dry NEUROLOGIC:  Alert and oriented x 3  ECG:   January 25, 2019: AV pacing at a rate of 78 beats minute.  Assessment / Plan:   1.  Coronary artery disease: Had a heart catheterization which revealed severe three-vessel coronary artery disease.  He had coronary artery bypass grafting to his LAD, circumflex artery, diagonal vessel.  The right coronary artery was too small to consider bypassing.  Overall doing well    2.  Chronic diastolic congestive heart failure: :  Gave the OK to stop Lasix Also advised him to avoid salt.    3.  Hypertension:  BP is well controlled.   Cont. Current meds.    4.  Chronic renal insufficiency: Followed by nephrology.  5.  Paroxysmal atrial fibrillation:   He had postoperative atrial fibrillation following surgery.  He was treated with amiodarone and Eliquis for a month or so.  Both of these have been discontinued.  He now is in sinus rhythm (atrial pacing).    Mertie Moores, MD  01/25/2019 12:20 PM    [place]  [place],  [place], [place]  [address] Pager [phone] Phone: [phone]; Fax: [phone]) [phone]   .

## 2019-01-30 ENCOUNTER — Other Ambulatory Visit: Payer: Self-pay | Admitting: Physician Assistant

## 2019-01-31 ENCOUNTER — Other Ambulatory Visit: Payer: Self-pay | Admitting: Physician Assistant

## 2019-01-31 ENCOUNTER — Other Ambulatory Visit: Payer: Self-pay | Admitting: Cardiovascular Disease

## 2019-02-21 ENCOUNTER — Ambulatory Visit (INDEPENDENT_AMBULATORY_CARE_PROVIDER_SITE_OTHER): Payer: Medicare Other | Admitting: *Deleted

## 2019-02-21 DIAGNOSIS — I495 Sick sinus syndrome: Secondary | ICD-10-CM

## 2019-02-21 LAB — CUP PACEART REMOTE DEVICE CHECK
Battery Impedance: 3013 Ohm
Battery Remaining Longevity: 4 mo
Battery Voltage: 2.74 V
Brady Statistic AP VP Percent: 0 %
Brady Statistic AP VS Percent: 64 %
Brady Statistic AS VP Percent: 0 %
Brady Statistic AS VS Percent: 36 %
Date Time Interrogation Session: 20210105130855
Implantable Lead Implant Date: 20020325
Implantable Lead Implant Date: 20020325
Implantable Lead Location: 753859
Implantable Lead Location: 753860
Implantable Lead Model: 4092
Implantable Lead Model: 4524
Implantable Pulse Generator Implant Date: 20110719
Lead Channel Impedance Value: 434 Ohm
Lead Channel Impedance Value: 686 Ohm
Lead Channel Pacing Threshold Amplitude: 0.5 V
Lead Channel Pacing Threshold Amplitude: 1.75 V
Lead Channel Pacing Threshold Pulse Width: 0.4 ms
Lead Channel Pacing Threshold Pulse Width: 0.4 ms
Lead Channel Setting Pacing Amplitude: 2.5 V
Lead Channel Setting Pacing Amplitude: 5 V
Lead Channel Setting Pacing Pulse Width: 0.4 ms
Lead Channel Setting Sensing Sensitivity: 5.6 mV

## 2019-02-28 ENCOUNTER — Ambulatory Visit: Payer: Medicare Other | Attending: Internal Medicine

## 2019-02-28 DIAGNOSIS — Z23 Encounter for immunization: Secondary | ICD-10-CM | POA: Diagnosis not present

## 2019-02-28 NOTE — Progress Notes (Signed)
   Covid-19 Vaccination Clinic  Name:  Jaquane Boughner    MRN: [id number] DOB: 1940-10-12  02/28/2019  Mr. Schoenberger was observed post Covid-19 immunization for 15 minutes without incidence. He was provided with Vaccine Information Sheet and instruction to access the V-Safe system.   Mr. Wyss was instructed to call 911 with any severe reactions post vaccine: Marland Kitchen Difficulty breathing  . Swelling of your face and throat  . A fast heartbeat  . A bad rash all over your body  . Dizziness and weakness    Immunizations Administered    Name Date Dose VIS Date Route   Pfizer COVID-19 Vaccine 02/28/2019  9:25 AM 0.3 mL 01/27/2019 Intramuscular   Manufacturer: [employer]   Lot: [id number]   [place]: [address]-8

## 2019-03-20 ENCOUNTER — Ambulatory Visit: Payer: Medicare Other | Attending: Internal Medicine

## 2019-03-20 DIAGNOSIS — Z23 Encounter for immunization: Secondary | ICD-10-CM | POA: Insufficient documentation

## 2019-03-20 NOTE — Progress Notes (Signed)
   Covid-19 Vaccination Clinic  Name:  Bradley Soto    MRN: [id number] DOB: 08-Oct-1940  03/20/2019  [name] was observed post Covid-19 immunization for 15 minutes without incidence. He was provided with Vaccine Information Sheet and instruction to access the V-Safe system.   [name] was instructed to call 911 with any severe reactions post vaccine: Marland Kitchen Difficulty breathing  . Swelling of your face and throat  . A fast heartbeat  . A bad rash all over your body  . Dizziness and weakness    Immunizations Administered    Name Date Dose VIS Date Route   Pfizer COVID-19 Vaccine 03/20/2019  8:55 AM 0.3 mL 01/27/2019 Intramuscular   Manufacturer: [place]   Lot: [id number]   [place]: [address]-1

## 2019-03-24 DIAGNOSIS — N184 Chronic kidney disease, stage 4 (severe): Secondary | ICD-10-CM | POA: Diagnosis not present

## 2019-03-24 DIAGNOSIS — I129 Hypertensive chronic kidney disease with stage 1 through stage 4 chronic kidney disease, or unspecified chronic kidney disease: Secondary | ICD-10-CM | POA: Diagnosis not present

## 2019-03-24 DIAGNOSIS — N189 Chronic kidney disease, unspecified: Secondary | ICD-10-CM | POA: Diagnosis not present

## 2019-03-24 DIAGNOSIS — M109 Gout, unspecified: Secondary | ICD-10-CM | POA: Diagnosis not present

## 2019-03-24 DIAGNOSIS — D631 Anemia in chronic kidney disease: Secondary | ICD-10-CM | POA: Diagnosis not present

## 2019-03-24 DIAGNOSIS — N2581 Secondary hyperparathyroidism of renal origin: Secondary | ICD-10-CM | POA: Diagnosis not present

## 2019-03-27 ENCOUNTER — Telehealth: Payer: Self-pay

## 2019-03-28 NOTE — Telephone Encounter (Signed)
Spoke with pts wife regarding appt on 03/31/19. Pts wife stated pt would check his vitals prior to appt. Pts wife questions and concerns were address.

## 2019-03-31 ENCOUNTER — Encounter: Payer: Self-pay | Admitting: Internal Medicine

## 2019-03-31 ENCOUNTER — Telehealth (INDEPENDENT_AMBULATORY_CARE_PROVIDER_SITE_OTHER): Payer: Medicare Other | Admitting: Internal Medicine

## 2019-03-31 VITALS — BP 136/60 | HR 62 | Ht 71.0 in | Wt 231.0 lb

## 2019-03-31 DIAGNOSIS — I480 Paroxysmal atrial fibrillation: Secondary | ICD-10-CM

## 2019-03-31 DIAGNOSIS — I10 Essential (primary) hypertension: Secondary | ICD-10-CM

## 2019-03-31 DIAGNOSIS — I495 Sick sinus syndrome: Secondary | ICD-10-CM | POA: Diagnosis not present

## 2019-03-31 DIAGNOSIS — I2510 Atherosclerotic heart disease of native coronary artery without angina pectoris: Secondary | ICD-10-CM | POA: Diagnosis not present

## 2019-03-31 NOTE — Progress Notes (Signed)
Electrophysiology TeleHealth Note   Due to national recommendations of social distancing due to COVID 19, an audio/video telehealth visit is felt to be most appropriate for this patient at this time.  See MyChart message from today for the patient's consent to telehealth for [name].    Date:  03/31/2019   ID:  Bradley Soto, DOB 03/04/1940, MRN [id number]  Location: patient's home  Provider location:  [facility]  Evaluation Performed: Follow-up visit  PCP:  [name], MD   Electrophysiologist:  Dr Rayann Heman  Chief Complaint:  Pacemaker follow up  History of Present Illness:    Bradley Soto is a 79 y.o. male who presents via telehealth conferencing today.  Since last being seen in our clinic, the patient reports doing reasonably well.  The visit is done with the help of his wife today and she helps with history.   Today, he denies symptoms of palpitations, chest pain, shortness of breath,  lower extremity edema, dizziness, presyncope, or syncope.  The patient is otherwise without complaint today.  The patient denies symptoms of fevers, chills, cough, or new SOB worrisome for COVID 19.  Past Medical History:  Diagnosis Date  . CAD (coronary artery disease)    s/p stent placement  . CKD (chronic kidney disease), stage III   . Colon polyps   . Diabetes mellitus without complication ([place])   . Gout   . HLD (hyperlipidemia)   . HTN (hypertension)   . Insomnia   . MI (myocardial infarction) ([place]) 1998  . NSTEMI (non-ST elevated myocardial infarction) ([place])    s/p PCI in early 2000s  . OSA on CPAP   . SSS (sick sinus syndrome) ([place])   . Type II or unspecified type diabetes mellitus without mention of complication, not stated as uncontrolled     Past Surgical History:  Procedure Laterality Date  . APPENDECTOMY    . CATARACT EXTRACTION    . CLIPPING OF ATRIAL APPENDAGE  12/05/2018   Procedure: Clipping Of Atrial Appendage;  Surgeon: Wonda Olds, MD;   [facility];  Service: Open Heart Surgery;;  . CORONARY ANGIOPLASTY WITH STENT PLACEMENT     x4  . CORONARY ARTERY BYPASS GRAFT N/A 12/05/2018   Procedure: CORONARY ARTERY BYPASS GRAFTING (CABG) X FOUR USING LEFT INTERNAL MAMMARY ARTERY AND RIGHT SAPHENOUS VEIN GRAFTS;  Surgeon: Wonda Olds, MD;  Location: [facility];  Service: Open Heart Surgery;  Laterality: N/A;  . HERNIA REPAIR     ventral hernia repair with mesh--revision also was required  . INSERT / REPLACE / REMOVE PACEMAKER  05/10/2000   pacemaker implanted in [place]  . IR THORACENTESIS ASP PLEURAL SPACE W/IMG GUIDE  12/21/2018  . IR THORACENTESIS ASP PLEURAL SPACE W/IMG GUIDE  12/30/2018  . LEFT HEART CATH AND CORONARY ANGIOGRAPHY N/A 11/30/2018   Procedure: LEFT HEART CATH AND CORONARY ANGIOGRAPHY;  Surgeon: Burnell Blanks, MD;  Location: [facility];  Service: Cardiovascular;  Laterality: N/A;  . MAZE N/A 12/05/2018   Procedure: MAZE;  Surgeon: Wonda Olds, MD;  Location: [facility];  Service: Open Heart Surgery;  Laterality: N/A;  . PACEMAKER GENERATOR CHANGE  09/03/09   MDT Adapta generator change in [place]  . PARTIAL COLON REMOVAL  2/2   polyp that could not be removed on colonoscopy  . TEE WITHOUT CARDIOVERSION N/A 12/05/2018   Procedure: TRANSESOPHAGEAL ECHOCARDIOGRAM (TEE);  Surgeon: Wonda Olds, MD;  Location: [facility];  Service: Open Heart Surgery;  Laterality: N/A;    Current Outpatient Medications  Medication Sig Dispense Refill  . allopurinol (ZYLOPRIM) 100 MG tablet Take 100 mg by mouth 2 (two) times daily.    Marland Kitchen ALPRAZolam (XANAX) 0.25 MG tablet Take 0.25 mg by mouth at bedtime as needed for sleep.    Marland Kitchen aspirin EC 81 MG tablet Take 81 mg by mouth daily.    . Cholecalciferol (VITAMIN D-3) 25 MCG (1000 UT) CAPS Take 1,000 Units by mouth 2 (two) times daily.     . diphenhydramine-acetaminophen (TYLENOL PM) 25-500 MG TABS tablet Take 1 tablet by mouth at bedtime as needed (sleep).    .  furosemide (LASIX) 40 MG tablet Take 40 mg by mouth daily.    . hydrALAZINE (APRESOLINE) 25 MG tablet Take 25 mg by mouth 2 (two) times daily.    . metoprolol tartrate (LOPRESSOR) 25 MG tablet Take 1.5 tablets (37.5 mg total) by mouth 2 (two) times daily. 60 tablet 11  . NOVOLOG MIX 70/30 FLEXPEN (70-30) 100 UNIT/ML FlexPen Inject 35 Units into the skin 2 (two) times daily with a meal.   3  . rosuvastatin (CRESTOR) 20 MG tablet TAKE 1 TABLET (20 MG TOTAL) BY MOUTH DAILY AT 6 PM. 90 tablet 3   No current facility-administered medications for this visit.    Allergies:   Shellfish allergy   Social History:  The patient  reports that he has never smoked. He has never used smokeless tobacco. He reports that he does not drink alcohol or use drugs.   Family History:  The patient's family history includes Breast cancer in his sister; Deep vein thrombosis in his sister; Heart attack (age of onset: 73) in his father; Heart disease in his father; Pulmonary embolism in his sister.   ROS:  Please see the history of present illness.   All other systems are personally reviewed and negative.    Exam:    Vital Signs:  BP 136/60   Pulse 62   Ht 5\' 11"  (1.803 m)   Wt 231 lb (104.8 kg)   BMI 32.22 kg/m   Well sounding and appearing, alert and conversant, regular work of breathing,  good skin color Eyes- anicteric, neuro- grossly intact, skin- no apparent rash or lesions or cyanosis, mouth- oral mucosa is pink  Labs/Other Tests and Data Reviewed:    Recent Labs: 12/06/2018: Magnesium 1.8 12/11/2018: BUN 81; Creatinine, Ser 4.43; Hemoglobin 9.6; Platelets 121; Potassium 4.4; Sodium 142   Wt Readings from Last 3 Encounters:  03/31/19 231 lb (104.8 kg)  01/25/19 233 lb (105.7 kg)  01/06/19 235 lb 9.6 oz (106.9 kg)      ASSESSMENT & PLAN:    1.  Symptomatic sinus bradycardia Normal pacemaker function by recent remote Nearing ERI Start monthly remotes for battery  Risks, benefits, and  alternatives to PPM pulse generator replacement were discussed in detail today.  The patient understands that risks include but are not limited to bleeding, infection, pneumothorax, perforation, tamponade, vascular damage, renal failure, MI, stroke, death, damage to his existing leads, and lead dislodgement and wishes to proceed.  Ok to schedule gen change when patient reaches ERI  2.  Paroxysmal atrial fibrillation AF burden <1% Had AF post op Will continue to monitor burden, if increases, would consider resuming Eliquis   3.  CAD No recent ischemic symptoms  4.  OSA Compliant with CPAP      Follow-up:  With [facility], me in 6 months    Patient Risk:  after  full review of this patients clinical status, I feel that they are at moderate risk at this time.  Today, I have spent 15 minutes with the patient with telehealth technology discussing arrhythmia management .    Army Fossa, MD  03/31/2019 10:17 AM     [facility] [address] [place] [address] [phone] (office) [phone] (fax)

## 2019-04-20 DIAGNOSIS — R972 Elevated prostate specific antigen [PSA]: Secondary | ICD-10-CM | POA: Diagnosis not present

## 2019-04-20 DIAGNOSIS — N403 Nodular prostate with lower urinary tract symptoms: Secondary | ICD-10-CM | POA: Diagnosis not present

## 2019-04-20 DIAGNOSIS — R3912 Poor urinary stream: Secondary | ICD-10-CM | POA: Diagnosis not present

## 2019-04-28 ENCOUNTER — Other Ambulatory Visit: Payer: Self-pay

## 2019-04-28 ENCOUNTER — Other Ambulatory Visit: Payer: Self-pay | Admitting: Cardiothoracic Surgery

## 2019-04-28 ENCOUNTER — Other Ambulatory Visit: Payer: Self-pay | Admitting: Physician Assistant

## 2019-04-28 MED ORDER — METOPROLOL TARTRATE 25 MG PO TABS: 37.5000 mg | ORAL_TABLET | Freq: Two times a day (BID) | ORAL | 3 refills | Status: DC

## 2019-05-18 DIAGNOSIS — N189 Chronic kidney disease, unspecified: Secondary | ICD-10-CM | POA: Diagnosis not present

## 2019-05-18 DIAGNOSIS — I5032 Chronic diastolic (congestive) heart failure: Secondary | ICD-10-CM | POA: Diagnosis not present

## 2019-05-18 DIAGNOSIS — I2510 Atherosclerotic heart disease of native coronary artery without angina pectoris: Secondary | ICD-10-CM | POA: Diagnosis not present

## 2019-05-18 DIAGNOSIS — I129 Hypertensive chronic kidney disease with stage 1 through stage 4 chronic kidney disease, or unspecified chronic kidney disease: Secondary | ICD-10-CM | POA: Diagnosis not present

## 2019-05-18 DIAGNOSIS — N184 Chronic kidney disease, stage 4 (severe): Secondary | ICD-10-CM | POA: Diagnosis not present

## 2019-05-18 DIAGNOSIS — N2581 Secondary hyperparathyroidism of renal origin: Secondary | ICD-10-CM | POA: Diagnosis not present

## 2019-05-18 DIAGNOSIS — I4891 Unspecified atrial fibrillation: Secondary | ICD-10-CM | POA: Diagnosis not present

## 2019-05-18 DIAGNOSIS — E1122 Type 2 diabetes mellitus with diabetic chronic kidney disease: Secondary | ICD-10-CM | POA: Diagnosis not present

## 2019-05-18 DIAGNOSIS — D631 Anemia in chronic kidney disease: Secondary | ICD-10-CM | POA: Diagnosis not present

## 2019-05-23 ENCOUNTER — Telehealth: Payer: Self-pay

## 2019-05-23 ENCOUNTER — Ambulatory Visit (INDEPENDENT_AMBULATORY_CARE_PROVIDER_SITE_OTHER): Payer: Medicare Other | Admitting: *Deleted

## 2019-05-23 DIAGNOSIS — I495 Sick sinus syndrome: Secondary | ICD-10-CM

## 2019-05-23 LAB — CUP PACEART REMOTE DEVICE CHECK
Battery Impedance: 3803 Ohm
Battery Voltage: 2.73 V
Brady Statistic RV Percent Paced: 0 %
Date Time Interrogation Session: 20210406083836
Implantable Lead Implant Date: 20020325
Implantable Lead Implant Date: 20020325
Implantable Lead Location: 753859
Implantable Lead Location: 753860
Implantable Lead Model: 4092
Implantable Lead Model: 4524
Implantable Pulse Generator Implant Date: 20110719
Lead Channel Impedance Value: 67 Ohm
Lead Channel Impedance Value: 701 Ohm
Lead Channel Setting Pacing Amplitude: 2.5 V
Lead Channel Setting Pacing Pulse Width: 0.4 ms
Lead Channel Setting Sensing Sensitivity: 5.6 mV

## 2019-05-23 NOTE — Telephone Encounter (Signed)
Sonia Baller, please schedule generator change.  Office visit not required.

## 2019-05-23 NOTE — Telephone Encounter (Signed)
[facility] alert received- Battery at RRT as of 05/03/19.  Pt had video visit with Dr. Rayann Heman on 04/09/19, notes indicate gen change discussed, ok to schedule procedure when ERI is met.

## 2019-05-24 NOTE — Progress Notes (Signed)
PPM Remote  

## 2019-05-25 ENCOUNTER — Telehealth: Payer: Self-pay

## 2019-05-25 DIAGNOSIS — I495 Sick sinus syndrome: Secondary | ICD-10-CM

## 2019-05-25 NOTE — Telephone Encounter (Signed)
Outreach made to Pt.  Spoke with wife.  Pt scheduled for gen change on June 01, 2019  Will come in for labs and instruction on April 13.  Work up complete.

## 2019-05-30 ENCOUNTER — Other Ambulatory Visit (HOSPITAL_COMMUNITY)
Admission: RE | Admit: 2019-05-30 | Discharge: 2019-05-30 | Disposition: A | Discharge status: Home / Self Care | Payer: Medicare Other | Source: Ambulatory Visit | Attending: Internal Medicine | Admitting: Internal Medicine

## 2019-05-30 ENCOUNTER — Other Ambulatory Visit: Payer: Self-pay

## 2019-05-30 ENCOUNTER — Telehealth: Payer: Self-pay

## 2019-05-30 ENCOUNTER — Other Ambulatory Visit: Payer: Medicare Other

## 2019-05-30 DIAGNOSIS — Z01812 Encounter for preprocedural laboratory examination: Secondary | ICD-10-CM | POA: Insufficient documentation

## 2019-05-30 DIAGNOSIS — I495 Sick sinus syndrome: Secondary | ICD-10-CM

## 2019-05-30 DIAGNOSIS — Z20822 Contact with and (suspected) exposure to covid-19: Secondary | ICD-10-CM | POA: Insufficient documentation

## 2019-05-30 LAB — CBC WITH DIFFERENTIAL/PLATELET
Basophils Absolute: 0 10*3/uL (ref 0.0–0.2)
Basos: 1 %
EOS (ABSOLUTE): 0.5 10*3/uL — ABNORMAL HIGH (ref 0.0–0.4)
Eos: 6 %
Hematocrit: 38.8 % (ref 37.5–51.0)
Hemoglobin: 12.5 g/dL — ABNORMAL LOW (ref 13.0–17.7)
Lymphocytes Absolute: 1.7 10*3/uL (ref 0.7–3.1)
Lymphs: 20 %
MCH: 30.6 pg (ref 26.6–33.0)
MCHC: 32.2 g/dL (ref 31.5–35.7)
MCV: 95 fL (ref 79–97)
Monocytes Absolute: 0.8 10*3/uL (ref 0.1–0.9)
Monocytes: 9 %
Neutrophils Absolute: 5.6 10*3/uL (ref 1.4–7.0)
Neutrophils: 64 %
Platelets: 136 10*3/uL — ABNORMAL LOW (ref 150–450)
RBC: 4.09 x10E6/uL — ABNORMAL LOW (ref 4.14–5.80)
RDW: 16.3 % — ABNORMAL HIGH (ref 11.6–15.4)
WBC: 8.6 10*3/uL (ref 3.4–10.8)

## 2019-05-30 LAB — BASIC METABOLIC PANEL
BUN/Creatinine Ratio: 18 (ref 10–24)
BUN: 72 mg/dL — ABNORMAL HIGH (ref 8–27)
CO2: 16 mmol/L — ABNORMAL LOW (ref 20–29)
Calcium: 8.8 mg/dL (ref 8.6–10.2)
Chloride: 111 mmol/L — ABNORMAL HIGH (ref 96–106)
Creatinine, Ser: 3.91 mg/dL — ABNORMAL HIGH (ref 0.76–1.27)
GFR calc Af Amer: 16 mL/min/{1.73_m2} — ABNORMAL LOW (ref >59)
GFR calc non Af Amer: 14 mL/min/{1.73_m2} — ABNORMAL LOW (ref >59)
Glucose: 248 mg/dL — ABNORMAL HIGH (ref 65–99)
Potassium: 4.2 mmol/L (ref 3.5–5.2)
Sodium: 138 mmol/L (ref 134–144)

## 2019-05-30 LAB — SARS CORONAVIRUS 2 (TAT 6-24 HRS): SARS Coronavirus 2: NEGATIVE

## 2019-05-30 NOTE — Telephone Encounter (Signed)
Error

## 2019-05-30 NOTE — Telephone Encounter (Signed)
-----   Message from Damian Leavell, RN sent at 05/25/2019  1:18 PM EDT ----- Regarding: instruction letter/soap

## 2019-06-01 ENCOUNTER — Other Ambulatory Visit: Payer: Self-pay

## 2019-06-01 ENCOUNTER — Ambulatory Visit (HOSPITAL_COMMUNITY)
Admission: RE | Admit: 2019-06-01 | Discharge: 2019-06-01 | Disposition: A | Discharge status: Home / Self Care | Payer: Medicare Other | Attending: Internal Medicine | Admitting: Internal Medicine

## 2019-06-01 ENCOUNTER — Ambulatory Visit (HOSPITAL_COMMUNITY)
Admission: RE | Disposition: A | Discharge status: Home / Self Care | Payer: Self-pay | Source: Home / Self Care | Attending: Internal Medicine

## 2019-06-01 DIAGNOSIS — Z79899 Other long term (current) drug therapy: Secondary | ICD-10-CM | POA: Diagnosis not present

## 2019-06-01 DIAGNOSIS — Z955 Presence of coronary angioplasty implant and graft: Secondary | ICD-10-CM | POA: Diagnosis not present

## 2019-06-01 DIAGNOSIS — N183 Chronic kidney disease, stage 3 unspecified: Secondary | ICD-10-CM | POA: Diagnosis not present

## 2019-06-01 DIAGNOSIS — I2510 Atherosclerotic heart disease of native coronary artery without angina pectoris: Secondary | ICD-10-CM | POA: Insufficient documentation

## 2019-06-01 DIAGNOSIS — Z794 Long term (current) use of insulin: Secondary | ICD-10-CM | POA: Insufficient documentation

## 2019-06-01 DIAGNOSIS — Z45010 Encounter for checking and testing of cardiac pacemaker pulse generator [battery]: Secondary | ICD-10-CM | POA: Diagnosis not present

## 2019-06-01 DIAGNOSIS — M109 Gout, unspecified: Secondary | ICD-10-CM | POA: Insufficient documentation

## 2019-06-01 DIAGNOSIS — E785 Hyperlipidemia, unspecified: Secondary | ICD-10-CM | POA: Diagnosis not present

## 2019-06-01 DIAGNOSIS — Z7982 Long term (current) use of aspirin: Secondary | ICD-10-CM | POA: Diagnosis not present

## 2019-06-01 DIAGNOSIS — G4733 Obstructive sleep apnea (adult) (pediatric): Secondary | ICD-10-CM | POA: Insufficient documentation

## 2019-06-01 DIAGNOSIS — I252 Old myocardial infarction: Secondary | ICD-10-CM | POA: Insufficient documentation

## 2019-06-01 DIAGNOSIS — G4700 Insomnia, unspecified: Secondary | ICD-10-CM | POA: Insufficient documentation

## 2019-06-01 DIAGNOSIS — E1122 Type 2 diabetes mellitus with diabetic chronic kidney disease: Secondary | ICD-10-CM | POA: Diagnosis not present

## 2019-06-01 DIAGNOSIS — I495 Sick sinus syndrome: Secondary | ICD-10-CM | POA: Insufficient documentation

## 2019-06-01 DIAGNOSIS — I129 Hypertensive chronic kidney disease with stage 1 through stage 4 chronic kidney disease, or unspecified chronic kidney disease: Secondary | ICD-10-CM | POA: Diagnosis not present

## 2019-06-01 HISTORY — PX: PPM GENERATOR CHANGEOUT: EP1233

## 2019-06-01 LAB — GLUCOSE, CAPILLARY: Glucose-Capillary: 236 mg/dL — ABNORMAL HIGH (ref 70–99)

## 2019-06-01 SURGERY — PPM GENERATOR CHANGEOUT

## 2019-06-01 MED ORDER — LIDOCAINE HCL 1 % IJ SOLN
INTRAMUSCULAR | Status: AC
  Filled 2019-06-01: qty 60

## 2019-06-01 MED ORDER — SODIUM CHLORIDE 0.9 % IV SOLN
INTRAVENOUS | Status: DC
  Administered 2019-06-01: 50 mL/h via INTRAVENOUS

## 2019-06-01 MED ORDER — SODIUM CHLORIDE 0.9% FLUSH: 3.0000 mL | INTRAVENOUS | Status: DC | PRN

## 2019-06-01 MED ORDER — SODIUM CHLORIDE 0.9% FLUSH: 3.0000 mL | Freq: Two times a day (BID) | INTRAVENOUS | Status: DC

## 2019-06-01 MED ORDER — CEFAZOLIN SODIUM-DEXTROSE 2-4 GM/100ML-% IV SOLN
INTRAVENOUS | Status: AC
  Filled 2019-06-01: qty 100

## 2019-06-01 MED ORDER — LIDOCAINE HCL (PF) 1 % IJ SOLN
INTRAMUSCULAR | Status: DC | PRN
  Administered 2019-06-01: 16:00:00 60 mL

## 2019-06-01 MED ORDER — CEFAZOLIN SODIUM-DEXTROSE 2-4 GM/100ML-% IV SOLN
2.0000 g | INTRAVENOUS | Status: AC
  Administered 2019-06-01: 15:00:00 2 g via INTRAVENOUS

## 2019-06-01 MED ORDER — CHLORHEXIDINE GLUCONATE 4 % EX LIQD: 4.0000 "application " | Freq: Once | CUTANEOUS | Status: DC

## 2019-06-01 MED ORDER — SODIUM CHLORIDE 0.9 % IV SOLN
80.0000 mg | INTRAVENOUS | Status: AC
  Administered 2019-06-01: 80 mg

## 2019-06-01 MED ORDER — SODIUM CHLORIDE 0.9 % IV SOLN
INTRAVENOUS | Status: AC
  Filled 2019-06-01: qty 2

## 2019-06-01 MED ORDER — ACETAMINOPHEN 325 MG PO TABS
325.0000 mg | ORAL_TABLET | ORAL | Status: DC | PRN
  Filled 2019-06-01: qty 2

## 2019-06-01 MED ORDER — ONDANSETRON HCL 4 MG/2ML IJ SOLN: 4.0000 mg | Freq: Four times a day (QID) | INTRAMUSCULAR | Status: DC | PRN

## 2019-06-01 MED ORDER — SODIUM CHLORIDE 0.9 % IV SOLN: 250.0000 mL | INTRAVENOUS | Status: DC | PRN

## 2019-06-01 MED ORDER — SODIUM CHLORIDE 0.9 % IV SOLN: Freq: Once | INTRAVENOUS | Status: DC

## 2019-06-01 SURGICAL SUPPLY — 5 items
CABLE SURGICAL S-101-97-12 (CABLE) ×2 IMPLANT
IPG PACE AZUR XT DR MRI W1DR01 (Pacemaker) IMPLANT
PACE AZURE XT DR MRI W1DR01 (Pacemaker) ×2 IMPLANT
PAD PRO RADIOLUCENT 2001M-C (PAD) ×2 IMPLANT
TRAY PACEMAKER INSERTION (PACKS) ×2 IMPLANT

## 2019-06-01 NOTE — Discharge Instructions (Signed)
Pacemaker Battery Change, Care After This sheet gives you information about how to care for yourself after your procedure. Your health care provider may also give you more specific instructions. If you have problems or questions, contact your health care provider. What can I expect after the procedure? After your procedure, it is common to have:  Pain or soreness at the site where the pacemaker was inserted.  Swelling at the site where the pacemaker was inserted. Follow these instructions at home: Incision care   Keep the incision clean and dry. ? Do not take baths, swim, or use a hot tub until your health care provider approves. ? You may shower the day after your procedure, or as directed by your health care provider. ? Pat the area dry with a clean towel. Do not rub the area. This may cause bleeding.  Follow instructions from your health care provider about how to take care of your incision. Make sure you: ? Wash your hands with soap and water before you change your bandage (dressing). If soap and water are not available, use hand sanitizer. ? Change your dressing as told by your health care provider. ? Leave stitches (sutures), skin glue, or adhesive strips in place. These skin closures may need to stay in place for 2 weeks or longer. If adhesive strip edges start to loosen and curl up, you may trim the loose edges. Do not remove adhesive strips completely unless your health care provider tells you to do that.  Check your incision area every day for signs of infection. Check for: ? More redness, swelling, or pain. ? More fluid or blood. ? Warmth. ? Pus or a bad smell. Activity  Do not lift anything that is heavier than 10 lb (4.5 kg) until your health care provider says it is okay to do so.  For the first 2 weeks, or as long as told by your health care provider: ? Avoid lifting your left arm higher than your shoulder. ? Be gentle when you move your arms over your head. It is okay  to raise your arm to comb your hair. ? Avoid strenuous exercise.  Ask your health care provider when it is okay to: ? Resume your normal activities. ? Return to work or school. ? Resume sexual activity. Eating and drinking  Eat a heart-healthy diet. This should include plenty of fresh fruits and vegetables, whole grains, low-fat dairy products, and lean protein like chicken and fish.  Limit alcohol intake to no more than 1 drink a day for non-pregnant women and 2 drinks a day for men. One drink equals 12 oz of beer, 5 oz of wine, or 1 oz of hard liquor.  Check ingredients and nutrition facts on packaged foods and beverages. Avoid the following types of food: ? Food that is high in salt (sodium). ? Food that is high in saturated fat, like full-fat dairy or red meat. ? Food that is high in trans fat, like fried food. ? Food and drinks that are high in sugar. Lifestyle  Do not use any products that contain nicotine or tobacco, such as cigarettes and e-cigarettes. If you need help quitting, ask your health care provider.  Take steps to manage and control your weight.  Get regular exercise. Aim for 150 minutes of moderate-intensity exercise (such as walking or yoga) or 75 minutes of vigorous exercise (such as running or swimming) each week.  Manage other health problems, such as diabetes or high blood pressure. Ask your health  care provider how you can manage these conditions. General instructions  Do not drive for 24 hours after your procedure if you were given a medicine to help you relax (sedative).  Take over-the-counter and prescription medicines only as told by your health care provider.  Avoid putting pressure on the area where the pacemaker was placed.  If you need an MRI after your pacemaker has been placed, be sure to tell the health care provider who orders the MRI that you have a pacemaker.  Avoid close and prolonged exposure to electrical devices that have strong  magnetic fields. These include: ? Cell phones. Avoid keeping them in a pocket near the pacemaker, and try using the ear opposite the pacemaker. ? MP3 players. ? Household appliances, like microwaves. ? Metal detectors. ? Electric generators. ? High-tension wires.  Keep all follow-up visits as directed by your health care provider. This is important. Contact a health care provider if:  You have pain at the incision site that is not relieved by over-the-counter or prescription medicines.  You have any of these around your incision site or coming from it: ? More redness, swelling, or pain. ? Fluid or blood. ? Warmth to the touch. ? Pus or a bad smell.  You have a fever.  You feel brief, occasional palpitations, light-headedness, or any symptoms that you think might be related to your heart. Get help right away if:  You experience chest pain that is different from the pain at the pacemaker site.  You develop a red streak that extends above or below the incision site.  You experience shortness of breath.  You have palpitations or an irregular heartbeat.  You have light-headedness that does not go away quickly.  You faint or have dizzy spells.  Your pulse suddenly drops or increases rapidly and does not return to normal.  You begin to gain weight and your legs and ankles swell. Summary  After your procedure, it is common to have pain, soreness, and some swelling where the pacemaker was inserted.  Make sure to keep your incision clean and dry. Follow instructions from your health care provider about how to take care of your incision.  Check your incision every day for signs of infection, such as more pain or swelling, pus or a bad smell, warmth, or leaking fluid and blood.  Avoid strenuous exercise and lifting your left arm higher than your shoulder for 2 weeks, or as long as told by your health care provider. This information is not intended to replace advice given to you by  your health care provider. Make sure you discuss any questions you have with your health care provider. Document Revised: 01/15/2017 Document Reviewed: 12/26/2015 Elsevier Patient Education  2020 [employer].

## 2019-06-01 NOTE — H&P (Signed)
Chief Complaint:  Pacemaker follow up  History of Present Illness:    Bradley Soto is a 79 y.o. male who presents for pacemaker . Since last being seen in our clinic, the patient reports doing reasonably well. Today, he denies symptoms of palpitations, chest pain, shortness of breath,  lower extremity edema, dizziness, presyncope, or syncope.  The patient is otherwise without complaint today.  The patient denies symptoms of fevers, chills, cough, or new SOB worrisome for COVID 19.      Past Medical History:  Diagnosis Date  . CAD (coronary artery disease)    s/p stent placement  . CKD (chronic kidney disease), stage III   . Colon polyps   . Diabetes mellitus without complication ([place])   . Gout   . HLD (hyperlipidemia)   . HTN (hypertension)   . Insomnia   . MI (myocardial infarction) ([place]) 1998  . NSTEMI (non-ST elevated myocardial infarction) ([place])    s/p PCI in early 2000s  . OSA on CPAP   . SSS (sick sinus syndrome) ([place])   . Type II or unspecified type diabetes mellitus without mention of complication, not stated as uncontrolled          Past Surgical History:  Procedure Laterality Date  . APPENDECTOMY    . CATARACT EXTRACTION    . CLIPPING OF ATRIAL APPENDAGE  12/05/2018   Procedure: Clipping Of Atrial Appendage;  Surgeon: Wonda Olds, MD;  Location: [facility] OR;  Service: Open Heart Surgery;;  . CORONARY ANGIOPLASTY WITH STENT PLACEMENT     x4  . CORONARY ARTERY BYPASS GRAFT N/A 12/05/2018   Procedure: CORONARY ARTERY BYPASS GRAFTING (CABG) X FOUR USING LEFT INTERNAL MAMMARY ARTERY AND RIGHT SAPHENOUS VEIN GRAFTS;  Surgeon: Wonda Olds, MD;  Location: [facility];  Service: Open Heart Surgery;  Laterality: N/A;  . HERNIA REPAIR     ventral hernia repair with mesh--revision also was required  . INSERT / REPLACE / REMOVE PACEMAKER  05/10/2000   pacemaker implanted in [place]  . IR THORACENTESIS ASP PLEURAL SPACE W/IMG GUIDE  12/21/2018   . IR THORACENTESIS ASP PLEURAL SPACE W/IMG GUIDE  12/30/2018  . LEFT HEART CATH AND CORONARY ANGIOGRAPHY N/A 11/30/2018   Procedure: LEFT HEART CATH AND CORONARY ANGIOGRAPHY;  Surgeon: Burnell Blanks, MD;  Location: [facility];  Service: Cardiovascular;  Laterality: N/A;  . MAZE N/A 12/05/2018   Procedure: MAZE;  Surgeon: Wonda Olds, MD;  Location: [facility];  Service: Open Heart Surgery;  Laterality: N/A;  . PACEMAKER GENERATOR CHANGE  09/03/09   MDT Adapta generator change in [place]  . PARTIAL COLON REMOVAL  2/2   polyp that could not be removed on colonoscopy  . TEE WITHOUT CARDIOVERSION N/A 12/05/2018   Procedure: TRANSESOPHAGEAL ECHOCARDIOGRAM (TEE);  Surgeon: Wonda Olds, MD;  Location: [facility];  Service: Open Heart Surgery;  Laterality: N/A;          Current Outpatient Medications  Medication Sig Dispense Refill  . allopurinol (ZYLOPRIM) 100 MG tablet Take 100 mg by mouth 2 (two) times daily.    Marland Kitchen ALPRAZolam (XANAX) 0.25 MG tablet Take 0.25 mg by mouth at bedtime as needed for sleep.    Marland Kitchen aspirin EC 81 MG tablet Take 81 mg by mouth daily.    . Cholecalciferol (VITAMIN D-3) 25 MCG (1000 UT) CAPS Take 1,000 Units by mouth 2 (two) times daily.     . diphenhydramine-acetaminophen (TYLENOL PM) 25-500 MG TABS tablet Take 1 tablet  by mouth at bedtime as needed (sleep).    . furosemide (LASIX) 40 MG tablet Take 40 mg by mouth daily.    . hydrALAZINE (APRESOLINE) 25 MG tablet Take 25 mg by mouth 2 (two) times daily.    . metoprolol tartrate (LOPRESSOR) 25 MG tablet Take 1.5 tablets (37.5 mg total) by mouth 2 (two) times daily. 60 tablet 11  . NOVOLOG MIX 70/30 FLEXPEN (70-30) 100 UNIT/ML FlexPen Inject 35 Units into the skin 2 (two) times daily with a meal.   3  . rosuvastatin (CRESTOR) 20 MG tablet TAKE 1 TABLET (20 MG TOTAL) BY MOUTH DAILY AT 6 PM. 90 tablet 3   No current facility-administered medications for this visit.     Allergies:   Shellfish allergy   Social History:  The patient  reports that he has never smoked. He has never used smokeless tobacco. He reports that he does not drink alcohol or use drugs.   Family History:  The patient's family history includes Breast cancer in his sister; Deep vein thrombosis in his sister; Heart attack (age of onset: 34) in his father; Heart disease in his father; Pulmonary embolism in his sister.   ROS:  Please see the history of present illness.   All other systems are personally reviewed and negative.   Physical Exam: Vitals:   06/01/19 1155  BP: (!) 182/80  Pulse: 67  Resp: 17  Temp: 98 F (36.7 C)  TempSrc: Skin  SpO2: 99%  Weight: 104.3 kg  Height: 5\' 11"  (1.803 m)    GEN- The patient is well appearing, alert and oriented x 3 today.   Head- normocephalic, atraumatic Eyes-  Sclera clear, conjunctiva pink Ears- hearing intact Oropharynx- clear Neck- supple, Lungs-  normal work of breathing Heart- Regular rate and rhythm  GI- soft  Extremities- no clubbing, cyanosis, or edema      Labs/Other Tests and Data Reviewed:    Recent Labs: 12/06/2018: Magnesium 1.8 12/11/2018: BUN 81; Creatinine, Ser 4.43; Hemoglobin 9.6; Platelets 121; Potassium 4.4; Sodium 142      Wt Readings from Last 3 Encounters:  03/31/19 231 lb (104.8 kg)  01/25/19 233 lb (105.7 kg)  01/06/19 235 lb 9.6 oz (106.9 kg)      ASSESSMENT & PLAN:    1.  Symptomatic sinus bradycardia Pacemaker is at ERI.  Leads are from 2002 but appear functional.  He is clear that he would like to avoid lead revision if possible.  Risks, benefits, and alternatives to pacemaker pulse generator replacement were discussed in detail today.  The patient understands that risks include but are not limited to bleeding, infection, pneumothorax, perforation, tamponade, vascular damage, renal failure, MI, stroke, death, damage to his existing leads, and lead dislodgement and wishes to  proceed.   [name] MD, [place] 06/01/2019 2:39 PM

## 2019-06-02 ENCOUNTER — Encounter: Payer: Self-pay | Admitting: Cardiology

## 2019-06-02 MED FILL — Lidocaine HCl Local Inj 1%: INTRAMUSCULAR | Qty: 60 | Status: AC

## 2019-06-08 ENCOUNTER — Other Ambulatory Visit: Payer: Self-pay | Admitting: Physician Assistant

## 2019-06-13 ENCOUNTER — Ambulatory Visit (INDEPENDENT_AMBULATORY_CARE_PROVIDER_SITE_OTHER): Payer: Medicare Other | Admitting: *Deleted

## 2019-06-13 ENCOUNTER — Other Ambulatory Visit: Payer: Self-pay

## 2019-06-13 DIAGNOSIS — I495 Sick sinus syndrome: Secondary | ICD-10-CM

## 2019-06-13 LAB — CUP PACEART INCLINIC DEVICE CHECK
Battery Remaining Longevity: 137 mo
Battery Voltage: 3.22 V
Brady Statistic AP VP Percent: 0.22 %
Brady Statistic AP VS Percent: 33.44 %
Brady Statistic AS VP Percent: 0.11 %
Brady Statistic AS VS Percent: 66.23 %
Brady Statistic RA Percent Paced: 34.15 %
Brady Statistic RV Percent Paced: 0.33 %
Date Time Interrogation Session: 20210427100204
Implantable Lead Implant Date: 20020325
Implantable Lead Implant Date: 20020325
Implantable Lead Location: 753859
Implantable Lead Location: 753860
Implantable Lead Model: 4092
Implantable Lead Model: 4524
Implantable Pulse Generator Implant Date: 20210415
Lead Channel Impedance Value: 266 Ohm
Lead Channel Impedance Value: 342 Ohm
Lead Channel Impedance Value: 475 Ohm
Lead Channel Impedance Value: 589 Ohm
Lead Channel Pacing Threshold Amplitude: 0.75 V
Lead Channel Pacing Threshold Amplitude: 1.75 V
Lead Channel Pacing Threshold Pulse Width: 0.4 ms
Lead Channel Pacing Threshold Pulse Width: 0.8 ms
Lead Channel Sensing Intrinsic Amplitude: 0.625 mV
Lead Channel Sensing Intrinsic Amplitude: 24.75 mV
Lead Channel Setting Pacing Amplitude: 2.5 V
Lead Channel Setting Pacing Amplitude: 3 V
Lead Channel Setting Pacing Pulse Width: 0.4 ms
Lead Channel Setting Sensing Sensitivity: 1.2 mV

## 2019-06-13 NOTE — Patient Instructions (Signed)
Call the [facility] at [phone] with your home monitor serial number. It should start with 3 letters (such as "MEA....").

## 2019-06-13 NOTE — Progress Notes (Signed)
Wound check appointment s/p gen change. Steri-strips removed. Wound without redness or edema. Incision edges approximated, wound well healed. Normal device function. RV Thresholds, sensing, and impedances consistent with implant measurements. P waves measure 0.91mv today, 1.0 mv at implant, trend stable.  RA threshold 1.75v@ 0.34ms and 1.0 ms compared with implant 1.5v@0 .58ms. Sensing and impedance trends stable.  Atrial Capture management programmed off due to inaccurate Atrial auto threshold since implant.  Atrial output fixed at 3.0v @0 .19ms for safety margin.  Histogram distribution appropriate for patient and level of activity. No mode switches or ventricular arrhythmias noted. Patient educated about wound care, arm mobility, lifting restrictions. Patient to contact clinic with home remote device serial number, next scheduled remote check 09/01/19.  ROV 09/08/19 with Dr. Rayann Heman. Routed to Dr. Rayann Heman for review

## 2019-06-14 LAB — CUP PACEART REMOTE DEVICE CHECK
Battery Remaining Longevity: 138 mo
Battery Voltage: 3.22 V
Brady Statistic AP VP Percent: 0 %
Brady Statistic AP VS Percent: 32.29 %
Brady Statistic AS VP Percent: 0.01 %
Brady Statistic AS VS Percent: 67.7 %
Brady Statistic RA Percent Paced: 32.37 %
Brady Statistic RV Percent Paced: 0.01 %
Date Time Interrogation Session: 20210427181600
Implantable Lead Implant Date: 20020325
Implantable Lead Implant Date: 20020325
Implantable Lead Location: 753859
Implantable Lead Location: 753860
Implantable Lead Model: 4092
Implantable Lead Model: 4524
Implantable Pulse Generator Implant Date: 20210415
Lead Channel Impedance Value: 266 Ohm
Lead Channel Impedance Value: 342 Ohm
Lead Channel Impedance Value: 475 Ohm
Lead Channel Impedance Value: 589 Ohm
Lead Channel Pacing Threshold Amplitude: 0.875 V
Lead Channel Pacing Threshold Amplitude: 2.5 V
Lead Channel Pacing Threshold Pulse Width: 0.4 ms
Lead Channel Pacing Threshold Pulse Width: 0.4 ms
Lead Channel Sensing Intrinsic Amplitude: 0.625 mV
Lead Channel Sensing Intrinsic Amplitude: 0.875 mV
Lead Channel Sensing Intrinsic Amplitude: 23.875 mV
Lead Channel Sensing Intrinsic Amplitude: 24.75 mV
Lead Channel Setting Pacing Amplitude: 2.5 V
Lead Channel Setting Pacing Amplitude: 3 V
Lead Channel Setting Pacing Pulse Width: 0.4 ms
Lead Channel Setting Sensing Sensitivity: 1.2 mV

## 2019-07-04 ENCOUNTER — Other Ambulatory Visit: Payer: Self-pay | Admitting: *Deleted

## 2019-07-04 DIAGNOSIS — N184 Chronic kidney disease, stage 4 (severe): Secondary | ICD-10-CM

## 2019-07-10 ENCOUNTER — Ambulatory Visit (INDEPENDENT_AMBULATORY_CARE_PROVIDER_SITE_OTHER): Payer: Medicare Other | Admitting: Surgery

## 2019-07-10 ENCOUNTER — Ambulatory Visit (HOSPITAL_COMMUNITY)
Admission: RE | Admit: 2019-07-10 | Discharge: 2019-07-10 | Disposition: A | Discharge status: Home / Self Care | Payer: Medicare Other | Source: Ambulatory Visit | Attending: Surgery | Admitting: Surgery

## 2019-07-10 ENCOUNTER — Encounter: Payer: Self-pay | Admitting: Surgery

## 2019-07-10 ENCOUNTER — Other Ambulatory Visit: Payer: Self-pay

## 2019-07-10 ENCOUNTER — Ambulatory Visit (INDEPENDENT_AMBULATORY_CARE_PROVIDER_SITE_OTHER)
Admission: RE | Admit: 2019-07-10 | Discharge: 2019-07-10 | Disposition: A | Discharge status: Home / Self Care | Payer: Medicare Other | Source: Ambulatory Visit | Attending: Surgery | Admitting: Surgery

## 2019-07-10 VITALS — BP 143/82 | HR 61 | Temp 97.9°F | Resp 20 | Wt 234.0 lb

## 2019-07-10 DIAGNOSIS — I2510 Atherosclerotic heart disease of native coronary artery without angina pectoris: Secondary | ICD-10-CM | POA: Diagnosis not present

## 2019-07-10 DIAGNOSIS — N184 Chronic kidney disease, stage 4 (severe): Secondary | ICD-10-CM

## 2019-07-10 NOTE — H&P (View-Only) (Signed)
[facility]  Patient name: Bradley Soto MRN: [id number] DOB: [birth year] Sex: male   REQUESTING PROVIDER:    Dr. Hollie Salk   REASON FOR CONSULT:    Dialysis access  HISTORY OF PRESENT ILLNESS:   Bradley Soto is a 79 y.o. male, who is referred for discussions regarding dialysis access.  The patient suffers from stage IV renal disease.  This is secondary to hypertension and diabetes.Marland Kitchen  He also suffers from hyperlipidemia.  He has a pacemaker placed secondary to sick sinus syndrome.  He also suffers from coronary artery disease, status post MI and PCI in the early 2000's.  The patient is right-handed.  PAST MEDICAL HISTORY    Past Medical History:  Diagnosis Date  . CAD (coronary artery disease)    s/p stent placement  . CKD (chronic kidney disease), stage III   . Colon polyps   . Diabetes mellitus without complication ([place])   . Gout   . HLD (hyperlipidemia)   . HTN (hypertension)   . Insomnia   . MI (myocardial infarction) ([place]) 1998  . NSTEMI (non-ST elevated myocardial infarction) ([place])    s/p PCI in early 2000s  . OSA on CPAP   . SSS (sick sinus syndrome) ([place])   . Type II or unspecified type diabetes mellitus without mention of complication, not stated as uncontrolled      FAMILY HISTORY   Family History  Problem Relation Age of Onset  . Heart attack Father 62  . Heart disease Father   . Breast cancer Sister   . Pulmonary embolism Sister   . Deep vein thrombosis Sister     SOCIAL HISTORY:   Social History   Socioeconomic History  . Marital status: Married    Spouse name: Not on file  . Number of children: Not on file  . Years of education: Not on file  . Highest education level: Not on file  Occupational History  . Not on file  Tobacco Use  . Smoking status: Never Smoker  . Smokeless tobacco: Never Used  Substance and Sexual Activity  . Alcohol use: No  . Drug use: No  . Sexual  activity: Not on file  Other Topics Concern  . Not on file  Social History Narrative  . Not on file   Social Determinants of Health   Financial Resource Strain:   . Difficulty of Paying Living Expenses:   Food Insecurity:   . Worried About Charity fundraiser in the Last Year:   . Arboriculturist in the Last Year:   Transportation Needs:   . Film/video editor (Medical):   Marland Kitchen Lack of Transportation (Non-Medical):   Physical Activity:   . Days of Exercise per Week:   . Minutes of Exercise per Session:   Stress:   . Feeling of Stress :   Social Connections:   . Frequency of Communication with Friends and Family:   . Frequency of Social Gatherings with Friends and Family:   . Attends Religious Services:   . Active Member of Clubs or Organizations:   . Attends Archivist Meetings:   Marland Kitchen Marital Status:   Intimate Partner Violence:   . Fear of Current or Ex-Partner:   . Emotionally Abused:   Marland Kitchen Physically Abused:   . Sexually Abused:     ALLERGIES:    Allergies  Allergen Reactions  . Shellfish Allergy Rash    CURRENT MEDICATIONS:    Current Outpatient Medications  Medication Sig Dispense Refill  . allopurinol (ZYLOPRIM) 100 MG tablet Take 100 mg by mouth 2 (two) times daily.    Marland Kitchen ALPRAZolam (XANAX) 0.25 MG tablet Take 0.25 mg by mouth at bedtime as needed for sleep.    Marland Kitchen aspirin EC 81 MG tablet Take 81 mg by mouth daily.    . Cholecalciferol (VITAMIN D-3) 25 MCG (1000 UT) CAPS Take 1,000 Units by mouth 2 (two) times daily.     . diphenhydramine-acetaminophen (TYLENOL PM) 25-500 MG TABS tablet Take 0.5 tablets by mouth at bedtime as needed (sleep).     . furosemide (LASIX) 40 MG tablet Take 40 mg by mouth daily.    . hydrALAZINE (APRESOLINE) 25 MG tablet Take 25 mg by mouth 2 (two) times daily.    . metoprolol tartrate (LOPRESSOR) 25 MG tablet Take 1.5 tablets (37.5 mg total) by mouth 2 (two) times daily. 270 tablet 3  . NOVOLOG MIX 70/30 FLEXPEN (70-30)  100 UNIT/ML FlexPen Inject 35 Units into the skin 2 (two) times daily with a meal.   3  . rosuvastatin (CRESTOR) 20 MG tablet TAKE 1 TABLET (20 MG TOTAL) BY MOUTH DAILY AT 6 PM. 90 tablet 3  . sodium bicarbonate 650 MG tablet Take 650 mg by mouth 2 (two) times daily.    Marland Kitchen tetrahydrozoline 0.05 % ophthalmic solution Place 1 drop into both eyes daily as needed (Dry eyes).     No current facility-administered medications for this visit.    REVIEW OF SYSTEMS:   [X]  denotes positive finding, [ ]  denotes negative finding Cardiac  Comments:  Chest pain or chest pressure:    Shortness of breath upon exertion:    Short of breath when lying flat:    Irregular heart rhythm:        Vascular    Pain in calf, thigh, or hip brought on by ambulation:    Pain in feet at night that wakes you up from your sleep:     Blood clot in your veins:    Leg swelling:         Pulmonary    Oxygen at home:    Productive cough:     Wheezing:         Neurologic    Sudden weakness in arms or legs:     Sudden numbness in arms or legs:     Sudden onset of difficulty speaking or slurred speech:    Temporary loss of vision in one eye:     Problems with dizziness:         Gastrointestinal    Blood in stool:      Vomited blood:         Genitourinary    Burning when urinating:     Blood in urine:        Psychiatric    Major depression:         Hematologic    Bleeding problems:    Problems with blood clotting too easily:        Skin    Rashes or ulcers:        Constitutional    Fever or chills:     PHYSICAL EXAM:   Vitals:   07/10/19 0944  BP: (!) 143/82  Pulse: 61  Resp: 20  Temp: 97.9 F (36.6 C)  SpO2: 95%  Weight: 234 lb (106.1 kg)    GENERAL: The patient is a well-nourished male, in no acute distress. The vital signs are documented above.  CARDIAC: There is a regular rate and rhythm.  VASCULAR: Palpable right brachial and radial pulse PULMONARY: Nonlabored respirations ABDOMEN:  Soft and non-tender with normal pitched bowel sounds.  MUSCULOSKELETAL: There are no major deformities or cyanosis. NEUROLOGIC: No focal weakness or paresthesias are detected. SKIN: There are no ulcers or rashes noted. PSYCHIATRIC: The patient has a normal affect.  STUDIES:   I have reviewed the following: +-----------------+-------------+----------+--------+  Right Cephalic  Diameter (cm)Depth (cm)Findings  +-----------------+-------------+----------+--------+  Shoulder       0.48              +-----------------+-------------+----------+--------+  Prox upper arm    0.48              +-----------------+-------------+----------+--------+  Mid upper arm    0.43              +-----------------+-------------+----------+--------+  Dist upper arm    0.47              +-----------------+-------------+----------+--------+  Antecubital fossa  0.53              +-----------------+-------------+----------+--------+  Prox forearm     0.21              +-----------------+-------------+----------+--------+  Mid forearm     0.23              +-----------------+-------------+----------+--------+  Dist forearm     0.33              +-----------------+-------------+----------+--------+   +-----------------+-------------+----------+--------+  Right Basilic  Diameter (cm)Depth (cm)Findings  +-----------------+-------------+----------+--------+  Prox upper arm    0.70              +-----------------+-------------+----------+--------+  Mid upper arm    0.36              +-----------------+-------------+----------+--------+  Dist upper arm    0.47              +-----------------+-------------+----------+--------+  Antecubital fossa  0.66                +-----------------+-------------+----------+--------+   +-----------------+-------------+----------+--------+  Left Cephalic  Diameter (cm)Depth (cm)Findings  +-----------------+-------------+----------+--------+  Shoulder       0.36              +-----------------+-------------+----------+--------+  Prox upper arm    0.45              +-----------------+-------------+----------+--------+  Mid upper arm    0.42              +-----------------+-------------+----------+--------+  Dist upper arm    0.38              +-----------------+-------------+----------+--------+  Antecubital fossa  0.55              +-----------------+-------------+----------+--------+  Prox forearm     0.31              +-----------------+-------------+----------+--------+  Mid forearm     0.28              +-----------------+-------------+----------+--------+  Dist forearm     0.40              +-----------------+-------------+----------+--------+   +-----------------+-------------+----------+--------+  Left Basilic   Diameter (cm)Depth (cm)Findings  +-----------------+-------------+----------+--------+  Mid upper arm    0.82              +-----------------+-------------+----------+--------+  Dist upper arm    0.47              +-----------------+-------------+----------+--------+  Antecubital fossa  0.44              +-----------------+-------------+----------+--------+   ASSESSMENT and PLAN   Stage IV renal disease: The patient is reluctant about getting a fistula placed, however after a lengthy discussion, he is leaning towards having this done.  He is right-handed however he has a pacemaker on the left side.  He has an excellent right cephalic vein.  I have recommended  proceeding with a right brachiocephalic fistula.  I discussed the details of the operation including the risk of steal and nonmaturity.  All of his questions were answered.  He will contact me when he is ready to have this done.   Leia Alf, MD, FACS [facility] [phone] Pager [phone]

## 2019-07-10 NOTE — Progress Notes (Signed)
Vascular and Vein Specialist of [facility]  Patient name: Bradley Soto MRN: [id number] DOB: 01/16/1941 Sex: male   REQUESTING PROVIDER:    Dr. Hollie Salk   REASON FOR CONSULT:    Dialysis access  HISTORY OF PRESENT ILLNESS:   Bradley Soto is a 79 y.o. male, who is referred for discussions regarding dialysis access.  The patient suffers from stage IV renal disease.  This is secondary to hypertension and diabetes.Marland Kitchen  He also suffers from hyperlipidemia.  He has a pacemaker placed secondary to sick sinus syndrome.  He also suffers from coronary artery disease, status post MI and PCI in the early 2000's.  The patient is right-handed.  PAST MEDICAL HISTORY    Past Medical History:  Diagnosis Date  . CAD (coronary artery disease)    s/p stent placement  . CKD (chronic kidney disease), stage III   . Colon polyps   . Diabetes mellitus without complication ([place])   . Gout   . HLD (hyperlipidemia)   . HTN (hypertension)   . Insomnia   . MI (myocardial infarction) ([place]) 1998  . NSTEMI (non-ST elevated myocardial infarction) ([place])    s/p PCI in early 2000s  . OSA on CPAP   . SSS (sick sinus syndrome) ([place])   . Type II or unspecified type diabetes mellitus without mention of complication, not stated as uncontrolled      FAMILY HISTORY   Family History  Problem Relation Age of Onset  . Heart attack Father 35  . Heart disease Father   . Breast cancer Sister   . Pulmonary embolism Sister   . Deep vein thrombosis Sister     SOCIAL HISTORY:   Social History   Socioeconomic History  . Marital status: Married    Spouse name: Not on file  . Number of children: Not on file  . Years of education: Not on file  . Highest education level: Not on file  Occupational History  . Not on file  Tobacco Use  . Smoking status: Never Smoker  . Smokeless tobacco: Never Used  Substance and Sexual Activity  . Alcohol use: No  . Drug use: No  . Sexual  activity: Not on file  Other Topics Concern  . Not on file  Social History Narrative  . Not on file   Social Determinants of Health   Financial Resource Strain:   . Difficulty of Paying Living Expenses:   Food Insecurity:   . Worried About Charity fundraiser in the Last Year:   . Arboriculturist in the Last Year:   Transportation Needs:   . Film/video editor (Medical):   Marland Kitchen Lack of Transportation (Non-Medical):   Physical Activity:   . Days of Exercise per Week:   . Minutes of Exercise per Session:   Stress:   . Feeling of Stress :   Social Connections:   . Frequency of Communication with Friends and Family:   . Frequency of Social Gatherings with Friends and Family:   . Attends Religious Services:   . Active Member of Clubs or Organizations:   . Attends Archivist Meetings:   Marland Kitchen Marital Status:   Intimate Partner Violence:   . Fear of Current or Ex-Partner:   . Emotionally Abused:   Marland Kitchen Physically Abused:   . Sexually Abused:     ALLERGIES:    Allergies  Allergen Reactions  . Shellfish Allergy Rash    CURRENT MEDICATIONS:    Current Outpatient Medications  Medication Sig Dispense Refill  . allopurinol (ZYLOPRIM) 100 MG tablet Take 100 mg by mouth 2 (two) times daily.    Marland Kitchen ALPRAZolam (XANAX) 0.25 MG tablet Take 0.25 mg by mouth at bedtime as needed for sleep.    Marland Kitchen aspirin EC 81 MG tablet Take 81 mg by mouth daily.    . Cholecalciferol (VITAMIN D-3) 25 MCG (1000 UT) CAPS Take 1,000 Units by mouth 2 (two) times daily.     . diphenhydramine-acetaminophen (TYLENOL PM) 25-500 MG TABS tablet Take 0.5 tablets by mouth at bedtime as needed (sleep).     . furosemide (LASIX) 40 MG tablet Take 40 mg by mouth daily.    . hydrALAZINE (APRESOLINE) 25 MG tablet Take 25 mg by mouth 2 (two) times daily.    . metoprolol tartrate (LOPRESSOR) 25 MG tablet Take 1.5 tablets (37.5 mg total) by mouth 2 (two) times daily. 270 tablet 3  . NOVOLOG MIX 70/30 FLEXPEN (70-30)  100 UNIT/ML FlexPen Inject 35 Units into the skin 2 (two) times daily with a meal.   3  . rosuvastatin (CRESTOR) 20 MG tablet TAKE 1 TABLET (20 MG TOTAL) BY MOUTH DAILY AT 6 PM. 90 tablet 3  . sodium bicarbonate 650 MG tablet Take 650 mg by mouth 2 (two) times daily.    Marland Kitchen tetrahydrozoline 0.05 % ophthalmic solution Place 1 drop into both eyes daily as needed (Dry eyes).     No current facility-administered medications for this visit.    REVIEW OF SYSTEMS:   [X]  denotes positive finding, [ ]  denotes negative finding Cardiac  Comments:  Chest pain or chest pressure:    Shortness of breath upon exertion:    Short of breath when lying flat:    Irregular heart rhythm:        Vascular    Pain in calf, thigh, or hip brought on by ambulation:    Pain in feet at night that wakes you up from your sleep:     Blood clot in your veins:    Leg swelling:         Pulmonary    Oxygen at home:    Productive cough:     Wheezing:         Neurologic    Sudden weakness in arms or legs:     Sudden numbness in arms or legs:     Sudden onset of difficulty speaking or slurred speech:    Temporary loss of vision in one eye:     Problems with dizziness:         Gastrointestinal    Blood in stool:      Vomited blood:         Genitourinary    Burning when urinating:     Blood in urine:        Psychiatric    Major depression:         Hematologic    Bleeding problems:    Problems with blood clotting too easily:        Skin    Rashes or ulcers:        Constitutional    Fever or chills:     PHYSICAL EXAM:   Vitals:   07/10/19 0944  BP: (!) 143/82  Pulse: 61  Resp: 20  Temp: 97.9 F (36.6 C)  SpO2: 95%  Weight: 234 lb (106.1 kg)    GENERAL: The patient is a well-nourished male, in no acute distress. The vital signs are documented above.  CARDIAC: There is a regular rate and rhythm.  VASCULAR: Palpable right brachial and radial pulse PULMONARY: Nonlabored respirations ABDOMEN:  Soft and non-tender with normal pitched bowel sounds.  MUSCULOSKELETAL: There are no major deformities or cyanosis. NEUROLOGIC: No focal weakness or paresthesias are detected. SKIN: There are no ulcers or rashes noted. PSYCHIATRIC: The patient has a normal affect.  STUDIES:   I have reviewed the following: +-----------------+-------------+----------+--------+  Right Cephalic  Diameter (cm)Depth (cm)Findings  +-----------------+-------------+----------+--------+  Shoulder       0.48              +-----------------+-------------+----------+--------+  Prox upper arm    0.48              +-----------------+-------------+----------+--------+  Mid upper arm    0.43              +-----------------+-------------+----------+--------+  Dist upper arm    0.47              +-----------------+-------------+----------+--------+  Antecubital fossa  0.53              +-----------------+-------------+----------+--------+  Prox forearm     0.21              +-----------------+-------------+----------+--------+  Mid forearm     0.23              +-----------------+-------------+----------+--------+  Dist forearm     0.33              +-----------------+-------------+----------+--------+   +-----------------+-------------+----------+--------+  Right Basilic  Diameter (cm)Depth (cm)Findings  +-----------------+-------------+----------+--------+  Prox upper arm    0.70              +-----------------+-------------+----------+--------+  Mid upper arm    0.36              +-----------------+-------------+----------+--------+  Dist upper arm    0.47              +-----------------+-------------+----------+--------+  Antecubital fossa  0.66                +-----------------+-------------+----------+--------+   +-----------------+-------------+----------+--------+  Left Cephalic  Diameter (cm)Depth (cm)Findings  +-----------------+-------------+----------+--------+  Shoulder       0.36              +-----------------+-------------+----------+--------+  Prox upper arm    0.45              +-----------------+-------------+----------+--------+  Mid upper arm    0.42              +-----------------+-------------+----------+--------+  Dist upper arm    0.38              +-----------------+-------------+----------+--------+  Antecubital fossa  0.55              +-----------------+-------------+----------+--------+  Prox forearm     0.31              +-----------------+-------------+----------+--------+  Mid forearm     0.28              +-----------------+-------------+----------+--------+  Dist forearm     0.40              +-----------------+-------------+----------+--------+   +-----------------+-------------+----------+--------+  Left Basilic   Diameter (cm)Depth (cm)Findings  +-----------------+-------------+----------+--------+  Mid upper arm    0.82              +-----------------+-------------+----------+--------+  Dist upper arm    0.47              +-----------------+-------------+----------+--------+  Antecubital fossa  0.44              +-----------------+-------------+----------+--------+   ASSESSMENT and PLAN   Stage IV renal disease: The patient is reluctant about getting a fistula placed, however after a lengthy discussion, he is leaning towards having this done.  He is right-handed however he has a pacemaker on the left side.  He has an excellent right cephalic vein.  I have recommended  proceeding with a right brachiocephalic fistula.  I discussed the details of the operation including the risk of steal and nonmaturity.  All of his questions were answered.  He will contact me when he is ready to have this done.   Leia Alf, MD, FACS [facility] and Vein Specialists of [facility] [phone] Pager [phone]

## 2019-07-19 ENCOUNTER — Other Ambulatory Visit: Payer: Self-pay

## 2019-07-19 DIAGNOSIS — N184 Chronic kidney disease, stage 4 (severe): Secondary | ICD-10-CM | POA: Diagnosis not present

## 2019-07-19 DIAGNOSIS — I4891 Unspecified atrial fibrillation: Secondary | ICD-10-CM | POA: Diagnosis not present

## 2019-07-19 DIAGNOSIS — E872 Acidosis: Secondary | ICD-10-CM | POA: Diagnosis not present

## 2019-07-19 DIAGNOSIS — I129 Hypertensive chronic kidney disease with stage 1 through stage 4 chronic kidney disease, or unspecified chronic kidney disease: Secondary | ICD-10-CM | POA: Diagnosis not present

## 2019-07-19 DIAGNOSIS — N189 Chronic kidney disease, unspecified: Secondary | ICD-10-CM | POA: Diagnosis not present

## 2019-07-19 DIAGNOSIS — E1122 Type 2 diabetes mellitus with diabetic chronic kidney disease: Secondary | ICD-10-CM | POA: Diagnosis not present

## 2019-07-19 DIAGNOSIS — D631 Anemia in chronic kidney disease: Secondary | ICD-10-CM | POA: Diagnosis not present

## 2019-07-19 DIAGNOSIS — I5032 Chronic diastolic (congestive) heart failure: Secondary | ICD-10-CM | POA: Diagnosis not present

## 2019-07-19 DIAGNOSIS — N2581 Secondary hyperparathyroidism of renal origin: Secondary | ICD-10-CM | POA: Diagnosis not present

## 2019-07-24 ENCOUNTER — Telehealth: Payer: Self-pay

## 2019-07-24 NOTE — Telephone Encounter (Signed)
The pt wife wanted to know if we received a transmission from the pt because he had an episode. I told her he was not due for a transmission until 09-01-2019. I asked what kind of episode he had and he did not tell her. He did not want to talk to me. She thanked me for talking with her.

## 2019-07-26 ENCOUNTER — Other Ambulatory Visit (HOSPITAL_COMMUNITY)
Admission: RE | Admit: 2019-07-26 | Discharge: 2019-07-26 | Disposition: A | Discharge status: Home / Self Care | Payer: Medicare Other | Source: Ambulatory Visit | Attending: Surgery | Admitting: Surgery

## 2019-07-26 DIAGNOSIS — Z20822 Contact with and (suspected) exposure to covid-19: Secondary | ICD-10-CM | POA: Insufficient documentation

## 2019-07-26 DIAGNOSIS — Z01812 Encounter for preprocedural laboratory examination: Secondary | ICD-10-CM | POA: Insufficient documentation

## 2019-07-26 LAB — SARS CORONAVIRUS 2 (TAT 6-24 HRS): SARS Coronavirus 2: NEGATIVE

## 2019-07-27 ENCOUNTER — Other Ambulatory Visit: Payer: Self-pay

## 2019-07-27 ENCOUNTER — Encounter (HOSPITAL_COMMUNITY): Payer: Self-pay | Admitting: Surgery

## 2019-07-27 ENCOUNTER — Encounter: Payer: Self-pay | Admitting: Internal Medicine

## 2019-07-27 NOTE — Progress Notes (Signed)
SDW-Pre-op call completed by pt spouse, Clarise Cruz (DPR). Spouse denies that pt C/O SOB and chest pain. Spouse stated that pt is under the care of Dr. Rayann Heman, Cardiology (EP), Dr. [name], Cardiology and Dr. Lodema Hong, PCP. Spouse stated that pt had a stress test  " many years ago."  Nurse emailed Peri-op Prescription for ICD and notified [employer] Rep ( per protocol). Spouse made aware to have pt stop taking  vitamins, fish oil and herbal medications. Do not take any NSAIDs ie: Ibuprofen, Advil, Naproxen (Aleve), Motrin, BC and Goody Powder. Spouse made aware to have pt take 70% ( 24 units) of Novolog 70/30 at HS and no insulin DOS. Spouse made aware to have pt check CBG every 2 hours prior to arrival to hospital on DOS. Spouse made aware to treat a CBG < 70 with 4 ounces of apple or cranberry juice, wait 15 minutes after intervention to recheck CBG, if CBG remains < 70, call Short Stay unit to speak with a nurse. Spouse reminded to have pt continue to quarantine. Spouse verbalized understanding of all pre-op instructions. PA, Anesthesiology, asked to review pt history, see note.

## 2019-07-27 NOTE — Progress Notes (Signed)
Anesthesia Chart Review: Same day workup  Follows with cardiology for history of symptomatic sinus bradycardia status post PPM (recent generator change out for 06/01/19), CAD status post CABG 12/05/2018.  Last seen by general cardiology 01/25/2019, per note he was overall doing well.  Noted that he had paroxysmal atrial fibrillation after CABG, however he was treated with amiodarone and Eliquis for about a month and both have been discontinued.  He was noted to be in sinus rhythm with atrial pacing.  Last seen by EP cardiology 03/31/2019, normal pacemaker function noted, AF burden <1%, noted to be nearing ERI (subsequently underwent generator change out 06/01/19).  CKD 4/5 not yet on HD followed at [facility] by Dr. Hollie Salk.  OSA on CPAP-noted to be compliant per cardiology notes.  IDDM 2, last A1c in epic 8.3 on 12/01/2018.  We will need day of surgery labs and eval.  Intraoperative TEE 12/05/2018: POST-OP IMPRESSIONS  - Left Ventricle: The left ventricle is unchanged from pre-bypass.  - Right Ventricle: The right ventricle appears unchanged from pre-bypass.  - Aorta: The aorta appears unchanged from pre-bypass.  - Left Atrium: The left atrium appears unchanged from pre-bypass.  - Left Atrial Appendage: The left atrial appendage appears unchanged from  pre-bypass.  - Aortic Valve: The aortic valve appears unchanged from pre-bypass.  - Mitral Valve: The mitral valve appears unchanged from pre-bypass.  - Tricuspid Valve: The tricuspid valve appears unchanged from pre-bypass.  - Interatrial Septum: The interatrial septum appears unchanged from  pre-bypass.  - Interventricular Septum: The interventricular septum appears unchanged  from  pre-bypass.  - Pericardium: The pericardium appears unchanged from pre-bypass.  TTE (pre-CABG) 12/01/2018: 1. Left ventricular ejection fraction, by visual estimation, is 60 to  65%. The left ventricle has normal function. Normal left ventricular size.   Left ventricular septal wall thickness was mildly increased. Mildly  increased left ventricular posterior  wall thickness. There is mildly increased left ventricular hypertrophy.  2. Left ventricular diastolic Doppler parameters are consistent with  impaired relaxation pattern of LV diastolic filling.  3. Global right ventricle has normal systolic function.The right  ventricular size is normal. No increase in right ventricular wall  thickness.  4. Left atrial size was mildly dilated.  5. Right atrial size was normal.  6. Trivial pericardial effusion is present.  7. The mitral valve is normal in structure. No evidence of mitral valve  regurgitation. No evidence of mitral stenosis.  8. The tricuspid valve is normal in structure. Tricuspid valve  regurgitation was not visualized by color flow Doppler.  9. The aortic valve is normal in structure. Aortic valve regurgitation  was not visualized by color flow Doppler. Structurally normal aortic  valve, with no evidence of sclerosis or stenosis.  10. The pulmonic valve was normal in structure. Pulmonic valve  regurgitation is not visualized by color flow Doppler.  11. TR signal is inadequate for assessing pulmonary artery systolic  pressure.  12. The inferior vena cava is normal in size with <50% respiratory  variability, suggesting right atrial pressure of 8 mmHg.    [place], [facility] Short Stay Center/Anesthesiology Phone [phone] 07/27/2019 9:55 AM

## 2019-07-27 NOTE — Anesthesia Preprocedure Evaluation (Addendum)
Anesthesia Evaluation  Patient identified by MRN, date of birth, ID band  Reviewed: Allergy & Precautions, NPO status , Patient's Chart, lab work & pertinent test results  Airway Mallampati: II  TM Distance: >3 FB     Dental   Pulmonary sleep apnea , former smoker,    breath sounds clear to auscultation       Cardiovascular hypertension, + angina + CAD and + Past MI   Rhythm:Regular Rate:Normal     Neuro/Psych    GI/Hepatic negative GI ROS, Neg liver ROS,   Endo/Other  diabetes  Renal/GU Renal disease     Musculoskeletal   Abdominal   Peds  Hematology   Anesthesia Other Findings   Reproductive/Obstetrics                            Anesthesia Physical Anesthesia Plan  ASA: III  Anesthesia Plan: General   Post-op Pain Management:    Induction: Intravenous  PONV Risk Score and Plan: 2 and Ondansetron, Dexamethasone and Midazolam  Airway Management Planned: Oral ETT  Additional Equipment:   Intra-op Plan:   Post-operative Plan: Extubation in OR  Informed Consent: I have reviewed the patients History and Physical, chart, labs and discussed the procedure including the risks, benefits and alternatives for the proposed anesthesia with the patient or authorized representative who has indicated his/her understanding and acceptance.     Dental advisory given  Plan Discussed with: CRNA and Anesthesiologist  Anesthesia Plan Comments: (PAT note by Karoline Caldwell, PA-C: Follows with cardiology for history of symptomatic sinus bradycardia status post PPM (recent generator change out for 06/01/19), CAD status post CABG 12/05/2018.  Last seen by general cardiology 01/25/2019, per note he was overall doing well.  Noted that he had paroxysmal atrial fibrillation after CABG, however he was treated with amiodarone and Eliquis for about a month and both have been discontinued.  He was noted to be in sinus  rhythm with atrial pacing.  Last seen by EP cardiology 03/31/2019, normal pacemaker function noted, AF burden <1%, noted to be nearing ERI (subsequently underwent generator change out 06/01/19).  CKD 4/5 not yet on HD followed at [facility] by Dr. Hollie Salk.  OSA on CPAP-noted to be compliant per cardiology notes.  IDDM 2, last A1c in epic 8.3 on 12/01/2018.  We will need day of surgery labs and eval.  Intraoperative TEE 12/05/2018: POST-OP IMPRESSIONS  - Left Ventricle: The left ventricle is unchanged from pre-bypass.  - Right Ventricle: The right ventricle appears unchanged from pre-bypass.  - Aorta: The aorta appears unchanged from pre-bypass.  - Left Atrium: The left atrium appears unchanged from pre-bypass.  - Left Atrial Appendage: The left atrial appendage appears unchanged from  pre-bypass.  - Aortic Valve: The aortic valve appears unchanged from pre-bypass.  - Mitral Valve: The mitral valve appears unchanged from pre-bypass.  - Tricuspid Valve: The tricuspid valve appears unchanged from pre-bypass.  - Interatrial Septum: The interatrial septum appears unchanged from  pre-bypass.  - Interventricular Septum: The interventricular septum appears unchanged  from  pre-bypass.  - Pericardium: The pericardium appears unchanged from pre-bypass.  TTE (pre-CABG) 12/01/2018: 1. Left ventricular ejection fraction, by visual estimation, is 60 to  65%. The left ventricle has normal function. Normal left ventricular size.  Left ventricular septal wall thickness was mildly increased. Mildly  increased left ventricular posterior  wall thickness. There is mildly increased left ventricular hypertrophy.  2. Left ventricular diastolic Doppler  parameters are consistent with  impaired relaxation pattern of LV diastolic filling.  3. Global right ventricle has normal systolic function.The right  ventricular size is normal. No increase in right ventricular wall  thickness.  4. Left atrial  size was mildly dilated.  5. Right atrial size was normal.  6. Trivial pericardial effusion is present.  7. The mitral valve is normal in structure. No evidence of mitral valve  regurgitation. No evidence of mitral stenosis.  8. The tricuspid valve is normal in structure. Tricuspid valve  regurgitation was not visualized by color flow Doppler.  9. The aortic valve is normal in structure. Aortic valve regurgitation  was not visualized by color flow Doppler. Structurally normal aortic  valve, with no evidence of sclerosis or stenosis.  10. The pulmonic valve was normal in structure. Pulmonic valve  regurgitation is not visualized by color flow Doppler.  11. TR signal is inadequate for assessing pulmonary artery systolic  pressure.  12. The inferior vena cava is normal in size with <50% respiratory  variability, suggesting right atrial pressure of 8 mmHg.    )       Anesthesia Quick Evaluation

## 2019-07-27 NOTE — Progress Notes (Unsigned)
PERIOPERATIVE PRESCRIPTION FOR IMPLANTED CARDIAC DEVICE PROGRAMMING  Patient Information: Name:  Bradley Soto  DOB:  14-Aug-1940  MRN:  [id number]  {TIP - You do not have to delete this tip  -  Copy the info from the staff message sent by the PAT staff  then press F2 here and paste the information using CTL - V on the next line :[id number]}  *** Device Information:  Clinic EP Physician:  [name], MD   Device Type:  Pacemaker Manufacturer and Phone #:  Medtronic: [phone] Pacemaker Dependent?:  No. Date of Last Device Check:   Normal Device Function?:  Yes.    Electrophysiologist's Recommendations:   Have magnet available.  Provide continuous ECG monitoring when magnet is used or reprogramming is to be performed.   {Press F2, choose one   :[id number]}  Per Device Clinic Standing Orders, Drake Leach, RN  2:26 PM 07/27/2019

## 2019-07-28 ENCOUNTER — Other Ambulatory Visit: Payer: Self-pay

## 2019-07-28 ENCOUNTER — Ambulatory Visit (HOSPITAL_COMMUNITY): Payer: Medicare Other | Admitting: Physician Assistant

## 2019-07-28 ENCOUNTER — Encounter (HOSPITAL_COMMUNITY)
Admission: RE | Disposition: A | Discharge status: Home / Self Care | Payer: Self-pay | Source: Home / Self Care | Attending: Surgery

## 2019-07-28 ENCOUNTER — Ambulatory Visit (HOSPITAL_COMMUNITY)
Admission: RE | Admit: 2019-07-28 | Discharge: 2019-07-28 | Disposition: A | Discharge status: Home / Self Care | Payer: Medicare Other | Attending: Surgery | Admitting: Surgery

## 2019-07-28 ENCOUNTER — Encounter (HOSPITAL_COMMUNITY): Payer: Self-pay | Admitting: Surgery

## 2019-07-28 DIAGNOSIS — Z87891 Personal history of nicotine dependence: Secondary | ICD-10-CM | POA: Diagnosis not present

## 2019-07-28 DIAGNOSIS — M109 Gout, unspecified: Secondary | ICD-10-CM | POA: Diagnosis not present

## 2019-07-28 DIAGNOSIS — Z794 Long term (current) use of insulin: Secondary | ICD-10-CM | POA: Insufficient documentation

## 2019-07-28 DIAGNOSIS — N185 Chronic kidney disease, stage 5: Secondary | ICD-10-CM

## 2019-07-28 DIAGNOSIS — I495 Sick sinus syndrome: Secondary | ICD-10-CM | POA: Diagnosis not present

## 2019-07-28 DIAGNOSIS — E785 Hyperlipidemia, unspecified: Secondary | ICD-10-CM | POA: Insufficient documentation

## 2019-07-28 DIAGNOSIS — Z7982 Long term (current) use of aspirin: Secondary | ICD-10-CM | POA: Insufficient documentation

## 2019-07-28 DIAGNOSIS — G4733 Obstructive sleep apnea (adult) (pediatric): Secondary | ICD-10-CM | POA: Insufficient documentation

## 2019-07-28 DIAGNOSIS — Z91013 Allergy to seafood: Secondary | ICD-10-CM | POA: Diagnosis not present

## 2019-07-28 DIAGNOSIS — I2510 Atherosclerotic heart disease of native coronary artery without angina pectoris: Secondary | ICD-10-CM | POA: Diagnosis not present

## 2019-07-28 DIAGNOSIS — I252 Old myocardial infarction: Secondary | ICD-10-CM | POA: Insufficient documentation

## 2019-07-28 DIAGNOSIS — I129 Hypertensive chronic kidney disease with stage 1 through stage 4 chronic kidney disease, or unspecified chronic kidney disease: Secondary | ICD-10-CM | POA: Insufficient documentation

## 2019-07-28 DIAGNOSIS — Z95810 Presence of automatic (implantable) cardiac defibrillator: Secondary | ICD-10-CM | POA: Insufficient documentation

## 2019-07-28 DIAGNOSIS — N184 Chronic kidney disease, stage 4 (severe): Secondary | ICD-10-CM | POA: Insufficient documentation

## 2019-07-28 DIAGNOSIS — E1122 Type 2 diabetes mellitus with diabetic chronic kidney disease: Secondary | ICD-10-CM | POA: Diagnosis not present

## 2019-07-28 DIAGNOSIS — Z79899 Other long term (current) drug therapy: Secondary | ICD-10-CM | POA: Insufficient documentation

## 2019-07-28 HISTORY — DX: Anxiety disorder, unspecified: F41.9

## 2019-07-28 HISTORY — DX: Presence of dental prosthetic device (complete) (partial): Z97.2

## 2019-07-28 HISTORY — DX: Presence of spectacles and contact lenses: Z97.3

## 2019-07-28 HISTORY — PX: AV FISTULA PLACEMENT: SHX1204

## 2019-07-28 LAB — POCT I-STAT, CHEM 8
BUN: 65 mg/dL — ABNORMAL HIGH (ref 8–23)
Calcium, Ion: 1.18 mmol/L (ref 1.15–1.40)
Chloride: 113 mmol/L — ABNORMAL HIGH (ref 98–111)
Creatinine, Ser: 3.8 mg/dL — ABNORMAL HIGH (ref 0.61–1.24)
Glucose, Bld: 155 mg/dL — ABNORMAL HIGH (ref 70–99)
HCT: 35 % — ABNORMAL LOW (ref 39.0–52.0)
Hemoglobin: 11.9 g/dL — ABNORMAL LOW (ref 13.0–17.0)
Potassium: 5 mmol/L (ref 3.5–5.1)
Sodium: 140 mmol/L (ref 135–145)
TCO2: 20 mmol/L — ABNORMAL LOW (ref 22–32)

## 2019-07-28 LAB — GLUCOSE, CAPILLARY
Glucose-Capillary: 160 mg/dL — ABNORMAL HIGH (ref 70–99)
Glucose-Capillary: 151 mg/dL — ABNORMAL HIGH (ref 70–99)
Glucose-Capillary: 145 mg/dL — ABNORMAL HIGH (ref 70–99)

## 2019-07-28 SURGERY — ARTERIOVENOUS (AV) FISTULA CREATION
Anesthesia: Monitor Anesthesia Care | Site: Arm Upper | Laterality: Right

## 2019-07-28 MED ORDER — PROPOFOL 10 MG/ML IV BOLUS
INTRAVENOUS | Status: AC
  Filled 2019-07-28: qty 20

## 2019-07-28 MED ORDER — SODIUM CHLORIDE 0.9 % IV SOLN
INTRAVENOUS | Status: DC
  Administered 2019-07-28: 10 mL/h via INTRAVENOUS

## 2019-07-28 MED ORDER — SODIUM CHLORIDE 0.9 % IV SOLN
INTRAVENOUS | Status: DC | PRN
  Administered 2019-07-28: 500 mL

## 2019-07-28 MED ORDER — CHLORHEXIDINE GLUCONATE 0.12 % MT SOLN
OROMUCOSAL | Status: AC
  Administered 2019-07-28: 15 mL via OROMUCOSAL
  Filled 2019-07-28: qty 15

## 2019-07-28 MED ORDER — CEFAZOLIN SODIUM-DEXTROSE 2-4 GM/100ML-% IV SOLN
2.0000 g | INTRAVENOUS | Status: AC
  Administered 2019-07-28: 2 g via INTRAVENOUS
  Filled 2019-07-28: qty 100

## 2019-07-28 MED ORDER — PROPOFOL 10 MG/ML IV BOLUS
INTRAVENOUS | Status: DC | PRN
  Administered 2019-07-28: 20 mg via INTRAVENOUS

## 2019-07-28 MED ORDER — CHLORHEXIDINE GLUCONATE 4 % EX LIQD: 60.0000 mL | Freq: Once | CUTANEOUS | Status: DC

## 2019-07-28 MED ORDER — OXYCODONE-ACETAMINOPHEN 5-325 MG PO TABS: 1.0000 | ORAL_TABLET | ORAL | 0 refills | Status: DC | PRN

## 2019-07-28 MED ORDER — SODIUM CHLORIDE 0.9 % IV SOLN
INTRAVENOUS | Status: AC
  Filled 2019-07-28: qty 1.2

## 2019-07-28 MED ORDER — ORAL CARE MOUTH RINSE: 15.0000 mL | Freq: Once | OROMUCOSAL | Status: AC

## 2019-07-28 MED ORDER — 0.9 % SODIUM CHLORIDE (POUR BTL) OPTIME
TOPICAL | Status: DC | PRN
  Administered 2019-07-28: 1000 mL

## 2019-07-28 MED ORDER — PHENYLEPHRINE HCL-NACL 10-0.9 MG/250ML-% IV SOLN
INTRAVENOUS | Status: DC | PRN
  Administered 2019-07-28: 15 ug/min via INTRAVENOUS

## 2019-07-28 MED ORDER — FENTANYL CITRATE (PF) 250 MCG/5ML IJ SOLN
INTRAMUSCULAR | Status: DC | PRN
  Administered 2019-07-28: 50 ug via INTRAVENOUS

## 2019-07-28 MED ORDER — PROPOFOL 500 MG/50ML IV EMUL
INTRAVENOUS | Status: DC | PRN
  Administered 2019-07-28: 100 ug/kg/min via INTRAVENOUS

## 2019-07-28 MED ORDER — FENTANYL CITRATE (PF) 250 MCG/5ML IJ SOLN
INTRAMUSCULAR | Status: AC
  Filled 2019-07-28: qty 5

## 2019-07-28 MED ORDER — LIDOCAINE-EPINEPHRINE (PF) 1 %-1:200000 IJ SOLN
INTRAMUSCULAR | Status: DC | PRN
  Administered 2019-07-28: 6 mL

## 2019-07-28 MED ORDER — ONDANSETRON HCL 4 MG/2ML IJ SOLN: INTRAMUSCULAR | Status: DC | PRN

## 2019-07-28 MED ORDER — LIDOCAINE-EPINEPHRINE (PF) 1 %-1:200000 IJ SOLN
INTRAMUSCULAR | Status: AC
  Filled 2019-07-28: qty 30

## 2019-07-28 MED ORDER — ONDANSETRON HCL 4 MG/2ML IJ SOLN
INTRAMUSCULAR | Status: DC | PRN
  Administered 2019-07-28: 4 mg via INTRAVENOUS

## 2019-07-28 MED ORDER — CHLORHEXIDINE GLUCONATE 0.12 % MT SOLN: 15.0000 mL | Freq: Once | OROMUCOSAL | Status: AC

## 2019-07-28 SURGICAL SUPPLY — 35 items
ARMBAND PINK RESTRICT EXTREMIT (MISCELLANEOUS) ×4 IMPLANT
CANISTER SUCT 3000ML PPV (MISCELLANEOUS) ×2 IMPLANT
CLIP VESOCCLUDE MED 6/CT (CLIP) ×2 IMPLANT
CLIP VESOCCLUDE SM WIDE 6/CT (CLIP) ×2 IMPLANT
COVER PROBE W GEL 5X96 (DRAPES) ×2 IMPLANT
COVER WAND RF STERILE (DRAPES) IMPLANT
DERMABOND ADVANCED (GAUZE/BANDAGES/DRESSINGS) ×1
DERMABOND ADVANCED .7 DNX12 (GAUZE/BANDAGES/DRESSINGS) ×1 IMPLANT
ELECT REM PT RETURN 9FT ADLT (ELECTROSURGICAL) ×2
ELECTRODE REM PT RTRN 9FT ADLT (ELECTROSURGICAL) ×1 IMPLANT
GLOVE BIO SURGEON STRL SZ 6.5 (GLOVE) ×2 IMPLANT
GLOVE BIO SURGEON STRL SZ7 (GLOVE) ×2 IMPLANT
GLOVE BIOGEL PI IND STRL 6.5 (GLOVE) ×2 IMPLANT
GLOVE BIOGEL PI IND STRL 7.5 (GLOVE) ×1 IMPLANT
GLOVE BIOGEL PI INDICATOR 6.5 (GLOVE) ×2
GLOVE BIOGEL PI INDICATOR 7.5 (GLOVE) ×1
GLOVE SURG SS PI 7.5 STRL IVOR (GLOVE) ×2 IMPLANT
GOWN STRL NON-REIN LRG LVL3 (GOWN DISPOSABLE) ×2 IMPLANT
GOWN STRL REUS W/ TWL LRG LVL3 (GOWN DISPOSABLE) ×2 IMPLANT
GOWN STRL REUS W/ TWL XL LVL3 (GOWN DISPOSABLE) ×1 IMPLANT
GOWN STRL REUS W/TWL LRG LVL3 (GOWN DISPOSABLE) ×2
GOWN STRL REUS W/TWL XL LVL3 (GOWN DISPOSABLE) ×1
HEMOSTAT SNOW SURGICEL 2X4 (HEMOSTASIS) IMPLANT
KIT BASIN OR (CUSTOM PROCEDURE TRAY) ×2 IMPLANT
KIT TURNOVER KIT B (KITS) ×2 IMPLANT
NS IRRIG 1000ML POUR BTL (IV SOLUTION) ×2 IMPLANT
PACK CV ACCESS (CUSTOM PROCEDURE TRAY) ×2 IMPLANT
PAD ARMBOARD 7.5X6 YLW CONV (MISCELLANEOUS) ×4 IMPLANT
SUT PROLENE 6 0 CC (SUTURE) ×4 IMPLANT
SUT VIC AB 3-0 SH 27 (SUTURE) ×1
SUT VIC AB 3-0 SH 27X BRD (SUTURE) ×1 IMPLANT
SUT VICRYL 4-0 PS2 18IN ABS (SUTURE) ×2 IMPLANT
TOWEL GREEN STERILE (TOWEL DISPOSABLE) ×2 IMPLANT
UNDERPAD 30X36 HEAVY ABSORB (UNDERPADS AND DIAPERS) ×2 IMPLANT
WATER STERILE IRR 1000ML POUR (IV SOLUTION) ×2 IMPLANT

## 2019-07-28 NOTE — Op Note (Signed)
° ° °  Patient name: Bradley Soto MRN: [id number] DOB: 10-Jul-1940 Sex: male  07/28/2019 Pre-operative Diagnosis: Chronic renal insufficiency Post-operative diagnosis:  Same Surgeon:  Annamarie Major Assistants: Izetta Dakin Procedure:   Right brachiocephalic fistula Anesthesia: MAC Blood Loss: Minimal Specimens: None  Findings: Excellent 5-6 mm cephalic vein.  Disease-free 4 mm brachial artery  Indications: The patient has not yet on dialysis.  He comes in today for fistula creation.  He has a ICD on the left and so we placed this on the right side.  Procedure:  The patient was identified in the holding area and taken to [place] [facility]  The patient was then placed supine on the table. MAC anesthesia was administered.  The patient was prepped and draped in the usual sterile fashion.  A time out was called and antibiotics were administered.  1% lidocaine was used for local anesthesia.  Ultrasound was used to evaluate the cephalic vein in the upper arm.  It was widely patent and measured 5 to 6 mm.  A transverse incision was made just proximal to the antecubital crease.  I first dissected out the brachial artery.  This was a 4 mm disease-free artery.  It was encircled with Vesseloops.  Next I dissected out the cephalic vein.  This was an excellent vein measuring 5-6 mm.  It was fully mobilized throughout the width of the incision.  It was ligated distally after marking it for orientation.  The vein distended nicely with heparin saline.  Next the brachial artery was occluded with vascular clamps.  A #11 blade was used to make an arteriotomy which was extended longitudinally with Potts scissors.  The vein was cut to the appropriate length and spatulated to fit the size of the arteriotomy.  A running anastomosis was created with 6-0 Prolene.  Prior to completion the appropriate flushing maneuvers were performed and the anastomosis was completed.  There was an excellent thrill within the fistula.  The patient  had brisk radial Doppler signal.  I then inspected the course of the vein to make sure there were no kinks.  I did ligate the branch to the distal cephalic vein.  Hemostasis was then achieved.  The subcutaneous tissue was reapproximated with 3-0 Vicryl.  The skin was closed with 4-0 Vicryl followed by Dermabond.  There were no immediate complications.   Disposition: To PACU stable.   Theotis Burrow, M.D., [facility] of [place] Office: [phone] Pager:  [phone]

## 2019-07-28 NOTE — Transfer of Care (Signed)
Immediate Anesthesia Transfer of Care Note  Patient: Bradley Soto  Procedure(s) Performed: RIGHT ARM Brachiocephalic ARTERIOVENOUS (AV) FISTULA CREATION (Right Arm Upper)  Patient Location: PACU  Anesthesia Type:MAC  Level of Consciousness: awake, alert  and oriented  Airway & Oxygen Therapy: Patient Spontanous Breathing  Post-op Assessment: Report given to RN and Post -op Vital signs reviewed and stable  Post vital signs: Reviewed and stable  Last Vitals:  Vitals Value Taken Time  BP    Temp    Pulse 62 07/28/19 1121  Resp 50 07/28/19 1121  SpO2 97 % 07/28/19 1121  Vitals shown include unvalidated device data.  Last Pain:  Vitals:   07/28/19 0758  TempSrc:   PainSc: 0-No pain      Patients Stated Pain Goal: 4 (37/94/32 7614)  Complications: No complications documented.

## 2019-07-28 NOTE — Interval H&P Note (Signed)
History and Physical Interval Note:  07/28/2019 7:43 AM  Bradley Soto  has presented today for surgery, with the diagnosis of CHRONIC KIDNEY DISEASE STAGE 5.  The various methods of treatment have been discussed with the patient and family. After consideration of risks, benefits and other options for treatment, the patient has consented to  Procedure(s): ARTERIOVENOUS (AV) FISTULA CREATION (Right) as a surgical intervention.  The patient's history has been reviewed, patient examined, no change in status, stable for surgery.  I have reviewed the patient's chart and labs.  Questions were answered to the patient's satisfaction.     Annamarie Major

## 2019-07-28 NOTE — Anesthesia Postprocedure Evaluation (Signed)
Anesthesia Post Note  Patient: Bradley Soto  Procedure(s) Performed: RIGHT ARM Brachiocephalic ARTERIOVENOUS (AV) FISTULA CREATION (Right Arm Upper)     Patient location during evaluation: PACU Anesthesia Type: MAC Level of consciousness: awake Pain management: pain level controlled Vital Signs Assessment: post-procedure vital signs reviewed and stable Respiratory status: spontaneous breathing Cardiovascular status: stable Postop Assessment: no apparent nausea or vomiting Anesthetic complications: no   No complications documented.  Last Vitals:  Vitals:   07/28/19 1136 07/28/19 1145  BP: 126/60 132/71  Pulse: 61 61  Resp: 16 16  Temp: (!) 36.2 C (!) 36.2 C  SpO2: 96% 96%    Last Pain:  Vitals:   07/28/19 1145  TempSrc:   PainSc: 0-No pain                 Akram Kissick

## 2019-07-29 ENCOUNTER — Encounter (HOSPITAL_COMMUNITY): Payer: Self-pay | Admitting: Surgery

## 2019-08-01 NOTE — Addendum Note (Signed)
Addendum  created 08/01/19 2041 by Belinda Block, MD   Attestation recorded in [place], [place] filed

## 2019-08-14 ENCOUNTER — Other Ambulatory Visit: Payer: Self-pay | Admitting: *Deleted

## 2019-08-14 DIAGNOSIS — N184 Chronic kidney disease, stage 4 (severe): Secondary | ICD-10-CM

## 2019-08-15 ENCOUNTER — Other Ambulatory Visit: Payer: Self-pay

## 2019-08-15 ENCOUNTER — Ambulatory Visit (INDEPENDENT_AMBULATORY_CARE_PROVIDER_SITE_OTHER): Payer: Self-pay | Admitting: Physician Assistant

## 2019-08-15 ENCOUNTER — Ambulatory Visit (HOSPITAL_COMMUNITY)
Admission: RE | Admit: 2019-08-15 | Discharge: 2019-08-15 | Disposition: A | Discharge status: Home / Self Care | Payer: Medicare Other | Source: Ambulatory Visit | Attending: Vascular Surgery | Admitting: Vascular Surgery

## 2019-08-15 VITALS — BP 134/76 | HR 96 | Temp 97.5°F | Resp 20 | Ht 71.0 in | Wt 235.9 lb

## 2019-08-15 DIAGNOSIS — N184 Chronic kidney disease, stage 4 (severe): Secondary | ICD-10-CM | POA: Insufficient documentation

## 2019-08-15 NOTE — Progress Notes (Addendum)
POST OPERATIVE OFFICE NOTE    CC:  F/u for surgery  HPI:  This is a 79 y.o. male who is s/p right BC AVF on 07/28/2019 by Dr. Trula Slade.  Pt states he has been having some arm fatigue since his fistula creation.  He went to his PCP and given his sx, he was sent to our office for evaluation.   He states that his finger tips are not numb, but do have a different feeling to them.  He states that he is not dropping things and his strength is good in his right hand.  He is right handed and his fistula creation is on the right due to his PPM on the left.  He is not yet on HD.      Allergies  Allergen Reactions  . Shellfish Allergy Rash    Current Outpatient Medications  Medication Sig Dispense Refill  . allopurinol (ZYLOPRIM) 100 MG tablet Take 200 mg by mouth daily.     Marland Kitchen ALPRAZolam (XANAX) 0.25 MG tablet Take 0.25 mg by mouth at bedtime as needed for sleep.    Marland Kitchen aspirin EC 81 MG tablet Take 81 mg by mouth daily.    . Calcium-Magnesium-Vitamin D (CITRACAL SLOW RELEASE PO) Take 1 tablet by mouth daily.    . Cholecalciferol (VITAMIN D-3) 25 MCG (1000 UT) CAPS Take 2,000 Units by mouth daily.     . diphenhydramine-acetaminophen (TYLENOL PM) 25-500 MG TABS tablet Take 0.5 tablets by mouth at bedtime.     . furosemide (LASIX) 40 MG tablet Take 40 mg by mouth daily.    . hydrALAZINE (APRESOLINE) 25 MG tablet Take 25 mg by mouth 3 (three) times daily.     Marland Kitchen Ketotifen Fumarate (ALLERGY EYE DROPS OP) Place 1 drop into both eyes daily as needed (allergies).    . metoprolol tartrate (LOPRESSOR) 25 MG tablet Take 1.5 tablets (37.5 mg total) by mouth 2 (two) times daily. 270 tablet 3  . NOVOLOG MIX 70/30 FLEXPEN (70-30) 100 UNIT/ML FlexPen Inject 35 Units into the skin 2 (two) times daily with a meal.   3  . oxyCODONE-acetaminophen (PERCOCET) 5-325 MG tablet Take 1 tablet by mouth every 4 (four) hours as needed for severe pain. 10 tablet 0  . rosuvastatin (CRESTOR) 20 MG tablet TAKE 1 TABLET (20 MG  TOTAL) BY MOUTH DAILY AT 6 PM. 90 tablet 3   No current facility-administered medications for this visit.     ROS:  See HPI  Physical Exam:  Today's Vitals   08/15/19 1441  BP: 134/76  Pulse: 96  Resp: 20  Temp: (!) 97.5 F (36.4 C)  TempSrc: Temporal  SpO2: 95%  Weight: 235 lb 14.4 oz (107 kg)  Height: 5\' 11"  (1.803 m)   Body mass index is 32.9 kg/m.   Incision:  Healing nicely Extremities:  There is not a palpable radial or ulnar pulse.  He does have a +doppler signal in the right radial/ulnar and palmar arch that has significant augmentation with compression of the fistula.  The fingertips of the right hand are cooler than the right.  Motor and sensory are in tact. Hand grip is strong.  There is a thrill/bruit present.   Dialysis Duplex on 08/15/2019: +------------+----------+-------------+----------+--------+  OUTFLOW VEINPSV (cm/s)Diameter (cm)Depth (cm)Describe  +------------+----------+-------------+----------+--------+  Prox UA     257    0.81     0.59        +------------+----------+-------------+----------+--------+  Mid UA     316    0.80  0.29        +------------+----------+-------------+----------+--------+  Dist UA     140    0.96     0.14        +------------+----------+-------------+----------+--------+  AC Fossa    215    0.68     0.61        +------------+----------+-------------+----------+--------+  Patent arteriovenous fistula.  Right second digital pressure increases from 65 mm Hg to 146 mm Hg with  AVF compression consistent with AVF steal.    Assessment/Plan:  This is a 79 y.o. male who is s/p: Right BC AVF 07/28/2019 by Dr. Trula Slade  -the pt does have evidence of steal.  His motor is grossly intact right hand and there are no ulcers.  His right radial and ulnar pulses are not palpable but he does have doppler flow and these significantly augment with  compression of the fistula.    However, given he is right handed and his arm fatigues very easily, will have him f/u in 4-6 weeks with Dr. Trula Slade just to check on his steal sx.  He does have a PPM on the left and that is why his fistula is on the right.  Instructed pt to continue to exercise his hand.  If his sx worsen prior to his return, he will call us sooner.  -the fistula has matured nicely and should be ready to use in 3 months if his steal is not an issue.  He does not need a repeat duplex when he returns.   Leontine Locket, [facility] [phone]  Clinic MD:  Early

## 2019-09-01 ENCOUNTER — Ambulatory Visit (INDEPENDENT_AMBULATORY_CARE_PROVIDER_SITE_OTHER): Payer: Medicare Other | Admitting: *Deleted

## 2019-09-01 DIAGNOSIS — I495 Sick sinus syndrome: Secondary | ICD-10-CM | POA: Diagnosis not present

## 2019-09-01 LAB — CUP PACEART REMOTE DEVICE CHECK
Battery Remaining Longevity: 121 mo
Battery Voltage: 3.2 V
Brady Statistic AP VP Percent: 0.02 %
Brady Statistic AP VS Percent: 49.24 %
Brady Statistic AS VP Percent: 0.02 %
Brady Statistic AS VS Percent: 50.72 %
Brady Statistic RA Percent Paced: 49.24 %
Brady Statistic RV Percent Paced: 0.04 %
Date Time Interrogation Session: 20210715232651
Implantable Lead Implant Date: 20020325
Implantable Lead Implant Date: 20020325
Implantable Lead Location: 753859
Implantable Lead Location: 753860
Implantable Lead Model: 4092
Implantable Lead Model: 4524
Implantable Pulse Generator Implant Date: 20210415
Lead Channel Impedance Value: 266 Ohm
Lead Channel Impedance Value: 323 Ohm
Lead Channel Impedance Value: 494 Ohm
Lead Channel Impedance Value: 589 Ohm
Lead Channel Pacing Threshold Amplitude: 0.625 V
Lead Channel Pacing Threshold Amplitude: 2.5 V
Lead Channel Pacing Threshold Pulse Width: 0.4 ms
Lead Channel Pacing Threshold Pulse Width: 0.4 ms
Lead Channel Sensing Intrinsic Amplitude: 1 mV
Lead Channel Sensing Intrinsic Amplitude: 1 mV
Lead Channel Sensing Intrinsic Amplitude: 25.875 mV
Lead Channel Sensing Intrinsic Amplitude: 25.875 mV
Lead Channel Setting Pacing Amplitude: 2.5 V
Lead Channel Setting Pacing Amplitude: 3 V
Lead Channel Setting Pacing Pulse Width: 0.4 ms
Lead Channel Setting Sensing Sensitivity: 1.2 mV

## 2019-09-04 ENCOUNTER — Encounter: Payer: Medicare Other | Admitting: Internal Medicine

## 2019-09-05 NOTE — Progress Notes (Signed)
Remote pacemaker transmission.   

## 2019-09-11 ENCOUNTER — Encounter: Payer: Medicare Other | Admitting: Internal Medicine

## 2019-09-18 DIAGNOSIS — N184 Chronic kidney disease, stage 4 (severe): Secondary | ICD-10-CM | POA: Diagnosis not present

## 2019-09-18 DIAGNOSIS — I129 Hypertensive chronic kidney disease with stage 1 through stage 4 chronic kidney disease, or unspecified chronic kidney disease: Secondary | ICD-10-CM | POA: Diagnosis not present

## 2019-09-18 DIAGNOSIS — I5032 Chronic diastolic (congestive) heart failure: Secondary | ICD-10-CM | POA: Diagnosis not present

## 2019-09-18 DIAGNOSIS — E1122 Type 2 diabetes mellitus with diabetic chronic kidney disease: Secondary | ICD-10-CM | POA: Diagnosis not present

## 2019-09-18 DIAGNOSIS — I4891 Unspecified atrial fibrillation: Secondary | ICD-10-CM | POA: Diagnosis not present

## 2019-09-18 DIAGNOSIS — N2581 Secondary hyperparathyroidism of renal origin: Secondary | ICD-10-CM | POA: Diagnosis not present

## 2019-09-18 DIAGNOSIS — E872 Acidosis: Secondary | ICD-10-CM | POA: Diagnosis not present

## 2019-09-18 DIAGNOSIS — N189 Chronic kidney disease, unspecified: Secondary | ICD-10-CM | POA: Diagnosis not present

## 2019-09-18 DIAGNOSIS — D631 Anemia in chronic kidney disease: Secondary | ICD-10-CM | POA: Diagnosis not present

## 2019-09-25 ENCOUNTER — Other Ambulatory Visit: Payer: Self-pay

## 2019-09-25 ENCOUNTER — Ambulatory Visit (INDEPENDENT_AMBULATORY_CARE_PROVIDER_SITE_OTHER): Payer: Self-pay | Admitting: Surgery

## 2019-09-25 ENCOUNTER — Encounter: Payer: Self-pay | Admitting: Surgery

## 2019-09-25 VITALS — BP 149/70 | HR 71 | Temp 97.4°F | Resp 20 | Ht 71.0 in | Wt 232.0 lb

## 2019-09-25 DIAGNOSIS — N184 Chronic kidney disease, stage 4 (severe): Secondary | ICD-10-CM

## 2019-09-25 NOTE — Progress Notes (Signed)
Patient name: Bradley Soto MRN: [id number] DOB: 12-Sep-1940 Sex: male  REASON FOR VISIT:     follow up  HISTORY OF PRESENT ILLNESS:   Bradley Soto is a 79 y.o. male who is status post right brachiocephalic fistula creation on 07/28/2019.  At his follow-up visit, he was complaining of numbness in his fingers.  He is back for repeat evaluation.  He states that his right arm does fatigue with prolonged activity and he still has some numbness however this is not bothersome for him.  CURRENT MEDICATIONS:    Current Outpatient Medications  Medication Sig Dispense Refill  . allopurinol (ZYLOPRIM) 100 MG tablet Take 200 mg by mouth daily.     Marland Kitchen ALPRAZolam (XANAX) 0.25 MG tablet Take 0.25 mg by mouth at bedtime as needed for sleep.    Marland Kitchen aspirin EC 81 MG tablet Take 81 mg by mouth daily.    . Calcium-Magnesium-Vitamin D (CITRACAL SLOW RELEASE PO) Take 1 tablet by mouth daily.    . Cholecalciferol (VITAMIN D-3) 25 MCG (1000 UT) CAPS Take 2,000 Units by mouth daily.     . diphenhydramine-acetaminophen (TYLENOL PM) 25-500 MG TABS tablet Take 0.5 tablets by mouth at bedtime.     . furosemide (LASIX) 40 MG tablet Take 40 mg by mouth daily.    . hydrALAZINE (APRESOLINE) 25 MG tablet Take 25 mg by mouth 3 (three) times daily.     Marland Kitchen Ketotifen Fumarate (ALLERGY EYE DROPS OP) Place 1 drop into both eyes daily as needed (allergies).    . metoprolol tartrate (LOPRESSOR) 25 MG tablet Take 1.5 tablets (37.5 mg total) by mouth 2 (two) times daily. 270 tablet 3  . NOVOLOG MIX 70/30 FLEXPEN (70-30) 100 UNIT/ML FlexPen Inject 35 Units into the skin 2 (two) times daily with a meal.   3  . rosuvastatin (CRESTOR) 20 MG tablet TAKE 1 TABLET (20 MG TOTAL) BY MOUTH DAILY AT 6 PM. 90 tablet 3  . oxyCODONE-acetaminophen (PERCOCET) 5-325 MG tablet Take 1 tablet by mouth every 4 (four) hours as needed for severe pain. (Patient not taking: Reported on 09/25/2019) 10 tablet 0   No current  facility-administered medications for this visit.    REVIEW OF SYSTEMS:   [X]  denotes positive finding, [ ]  denotes negative finding Cardiac  Comments:  Chest pain or chest pressure:    Shortness of breath upon exertion:    Short of breath when lying flat:    Irregular heart rhythm:    Constitutional    Fever or chills:      PHYSICAL EXAM:   Vitals:   09/25/19 1344  BP: (!) 149/70  Pulse: 71  Resp: 20  Temp: (!) 97.4 F (36.3 C)  SpO2: 95%  Weight: 232 lb (105.2 kg)  Height: 5\' 11"  (1.803 m)    GENERAL: The patient is a well-nourished male, in no acute distress. The vital signs are documented above. CARDIOVASCULAR: There is a regular rate and rhythm. PULMONARY: Non-labored respirations Excellent thrill within fistula  STUDIES:   +------------+----------+-------------+----------+--------+  OUTFLOW VEINPSV (cm/s)Diameter (cm)Depth (cm)Describe  +------------+----------+-------------+----------+--------+  Prox UA     257    0.81     0.59        +------------+----------+-------------+----------+--------+  Mid UA     316    0.80     0.29        +------------+----------+-------------+----------+--------+  Dist UA     140    0.96     0.14        +------------+----------+-------------+----------+--------+  AC Fossa    215    0.68     0.61        +------------+----------+-------------+----------+--------+       MEDICAL ISSUES:   Mild steal syndrome: The patient's symptoms are very minimal.  He does not want to have anything done.  I did discuss the possibility of banding, but he is not interested as his symptoms do not prevent his daily activities.  I did discuss that if he develops a wound on his hand it does not improve that he needs to contact me.  Otherwise he will follow up on as-needed basis.  His fistula will be ready for cannulation on 10/28/2019.  Leia Alf, MD,  FACS [facility] [phone] Pager [phone]

## 2019-10-16 ENCOUNTER — Other Ambulatory Visit: Payer: Self-pay

## 2019-10-16 ENCOUNTER — Telehealth (INDEPENDENT_AMBULATORY_CARE_PROVIDER_SITE_OTHER): Payer: Medicare Other | Admitting: Internal Medicine

## 2019-10-16 DIAGNOSIS — I495 Sick sinus syndrome: Secondary | ICD-10-CM

## 2019-10-16 DIAGNOSIS — N184 Chronic kidney disease, stage 4 (severe): Secondary | ICD-10-CM

## 2019-10-16 DIAGNOSIS — I2510 Atherosclerotic heart disease of native coronary artery without angina pectoris: Secondary | ICD-10-CM | POA: Diagnosis not present

## 2019-10-16 NOTE — Progress Notes (Signed)
Electrophysiology TeleHealth Note   Due to national recommendations of social distancing due to [place] 19, an audio  telehealth visit is felt to be most appropriate for this patient at this time.  See MyChart message from today for the patient's consent to telehealth for [facility].  Date:  10/16/2019   ID:  Bradley Soto, DOB [birth year], MRN [id number]  Location: patient's home  Provider location:  [place]  Evaluation Performed: Follow-up visit  PCP:  [name], MD   Electrophysiologist:  Dr Rayann Heman  Chief Complaint:  palpitations  History of Present Illness:    Bradley Soto is a 79 y.o. male who presents via telehealth conferencing today.  Since his pacemaker generator change, the patient reports doing very well. History is provided by his wife today.  He is upset because he came to our office thinking that this would be an in office appointment.  He wishes for her to speak for him. He has ESRD and he has an AV fistula placed.  He has not yet required dialysis.    Today, his wife reports that he has not had symptoms of palpitations, chest pain, shortness of breath,  lower extremity edema, dizziness, presyncope, or syncope.     Past Medical History:  Diagnosis Date  . Anxiety   . CAD (coronary artery disease)    s/p stent placement  . CKD (chronic kidney disease), stage III   . Colon polyps   . Diabetes mellitus without complication ([place])   . Gout   . HLD (hyperlipidemia)   . HTN (hypertension)   . Insomnia   . MI (myocardial infarction) ([place]) 1998  . NSTEMI (non-ST elevated myocardial infarction) ([place])    s/p PCI in early 2000s  . OSA on CPAP    wears CPAP  . SSS (sick sinus syndrome) ([place])   . Type II or unspecified type diabetes mellitus without mention of complication, not stated as uncontrolled   . Wears glasses    reading   . Wears partial dentures     Past Surgical History:  Procedure Laterality Date  . APPENDECTOMY    . AV FISTULA  PLACEMENT Right 07/28/2019   Procedure: RIGHT ARM Brachiocephalic ARTERIOVENOUS (AV) FISTULA CREATION;  Surgeon: Serafina Mitchell, MD;  Location: [place];  Service: Vascular;  Laterality: Right;  . CATARACT EXTRACTION    . CLIPPING OF ATRIAL APPENDAGE  12/05/2018   Procedure: Clipping Of Atrial Appendage;  Surgeon: Wonda Olds, MD;  Location: [facility] OR;  Service: Open Heart Surgery;;  . CORONARY ANGIOPLASTY WITH STENT PLACEMENT     x4  . CORONARY ARTERY BYPASS GRAFT N/A 12/05/2018   Procedure: CORONARY ARTERY BYPASS GRAFTING (CABG) X FOUR USING LEFT INTERNAL MAMMARY ARTERY AND RIGHT SAPHENOUS VEIN GRAFTS;  Surgeon: Wonda Olds, MD;  Location: [place];  Service: Open Heart Surgery;  Laterality: N/A;  . HERNIA REPAIR     ventral hernia repair with mesh--revision also was required  . INSERT / REPLACE / REMOVE PACEMAKER  05/10/2000   pacemaker implanted in [place]  . IR THORACENTESIS ASP PLEURAL SPACE W/IMG GUIDE  12/21/2018  . IR THORACENTESIS ASP PLEURAL SPACE W/IMG GUIDE  12/30/2018  . LEFT HEART CATH AND CORONARY ANGIOGRAPHY N/A 11/30/2018   Procedure: LEFT HEART CATH AND CORONARY ANGIOGRAPHY;  Surgeon: Burnell Blanks, MD;  Location: [place] CV LAB;  Service: Cardiovascular;  Laterality: N/A;  . MAZE N/A 12/05/2018   Procedure: MAZE;  Surgeon: Wonda Olds, MD;  Location: [facility];  Service: Open Heart Surgery;  Laterality: N/A;  . PACEMAKER GENERATOR CHANGE  09/03/09   MDT Adapta generator change in [place]  . PARTIAL COLON REMOVAL  2/2   polyp that could not be removed on colonoscopy  . PPM GENERATOR CHANGEOUT N/A 06/01/2019   Procedure: PPM GENERATOR CHANGEOUT;  Surgeon: [name], MD;  Location: [place] CV LAB;  Service: Cardiovascular;  Laterality: N/A;  . TEE WITHOUT CARDIOVERSION N/A 12/05/2018   Procedure: TRANSESOPHAGEAL ECHOCARDIOGRAM (TEE);  Surgeon: Wonda Olds, MD;  Location: [place];  Service: Open Heart Surgery;  Laterality: N/A;    Current  Outpatient Medications  Medication Sig Dispense Refill  . allopurinol (ZYLOPRIM) 100 MG tablet Take 200 mg by mouth daily.     Marland Kitchen ALPRAZolam (XANAX) 0.25 MG tablet Take 0.25 mg by mouth at bedtime as needed for sleep.    Marland Kitchen aspirin EC 81 MG tablet Take 81 mg by mouth daily.    . Calcium-Magnesium-Vitamin D (CITRACAL SLOW RELEASE PO) Take 1 tablet by mouth daily.    . Cholecalciferol (VITAMIN D-3) 25 MCG (1000 UT) CAPS Take 2,000 Units by mouth daily.     . diphenhydramine-acetaminophen (TYLENOL PM) 25-500 MG TABS tablet Take 0.5 tablets by mouth at bedtime.     . furosemide (LASIX) 40 MG tablet Take 40 mg by mouth daily.    . hydrALAZINE (APRESOLINE) 25 MG tablet Take 25 mg by mouth 3 (three) times daily.     Marland Kitchen Ketotifen Fumarate (ALLERGY EYE DROPS OP) Place 1 drop into both eyes daily as needed (allergies).    . metoprolol tartrate (LOPRESSOR) 25 MG tablet Take 1.5 tablets (37.5 mg total) by mouth 2 (two) times daily. 270 tablet 3  . NOVOLOG MIX 70/30 FLEXPEN (70-30) 100 UNIT/ML FlexPen Inject 35 Units into the skin 2 (two) times daily with a meal.   3  . oxyCODONE-acetaminophen (PERCOCET) 5-325 MG tablet Take 1 tablet by mouth every 4 (four) hours as needed for severe pain. (Patient not taking: Reported on 09/25/2019) 10 tablet 0  . rosuvastatin (CRESTOR) 20 MG tablet TAKE 1 TABLET (20 MG TOTAL) BY MOUTH DAILY AT 6 PM. 90 tablet 3   No current facility-administered medications for this visit.    Allergies:   Shellfish allergy   Social History:  The patient  reports that he has quit smoking. His smoking use included cigarettes. He has never used smokeless tobacco. He reports that he does not drink alcohol and does not use drugs.   ROS:  Please see the history of present illness.   All other systems are personally reviewed and negative.    Exam:    Vital Signs:  There were no vitals taken for this visit.  Pt is unhappy and not willing to talk.  He wishes for his wife to speak  today.  Labs/Other Tests and Data Reviewed:    Recent Labs: 12/06/2018: Magnesium 1.8 05/30/2019: Platelets 136 07/28/2019: BUN 65; Creatinine, Ser 3.80; Hemoglobin 11.9; Potassium 5.0; Sodium 140   Wt Readings from Last 3 Encounters:  09/25/19 232 lb (105.2 kg)  08/15/19 235 lb 14.4 oz (107 kg)  07/28/19 230 lb (104.3 kg)     Last device remote is reviewed from [place] PDF which reveals normal device function, no arrhythmias chronically elevated atrial output   ASSESSMENT & PLAN:    1.  Sick sinus syndrome Normal pacemaker function Remotes are up to date  2. Paroxysmal atrial fibrillation afib burden is 0% His  only prior afib was post operative afib Continue to monitor and consider anticoagulation only if his afib returns in a substantial way.  3. CAD No ischemic symptoms  4. OSA Compliance with CPAP advised  5. ESRD He has an AV fistula but has not yet started HD  Risks, benefits and potential toxicities for medications prescribed and/or refilled reviewed with patient today.   Follow-up:  Return to see EP APP in a year   Patient Risk:  after full review of this patients clinical status, I feel that they are at moderate risk at this time.  Today, I have spent 15 minutes with the patient with telehealth technology discussing arrhythmia management .    SignedThompson Grayer, MD  10/16/2019 1:41 PM     [address] [place] [address] [phone] (office) [phone] (fax)

## 2019-10-24 DIAGNOSIS — E119 Type 2 diabetes mellitus without complications: Secondary | ICD-10-CM | POA: Diagnosis not present

## 2019-10-24 DIAGNOSIS — Z20828 Contact with and (suspected) exposure to other viral communicable diseases: Secondary | ICD-10-CM | POA: Diagnosis not present

## 2019-10-24 DIAGNOSIS — R05 Cough: Secondary | ICD-10-CM | POA: Diagnosis not present

## 2019-10-24 DIAGNOSIS — R509 Fever, unspecified: Secondary | ICD-10-CM | POA: Diagnosis not present

## 2019-10-24 DIAGNOSIS — Z1152 Encounter for screening for COVID-19: Secondary | ICD-10-CM | POA: Diagnosis not present

## 2019-10-31 ENCOUNTER — Encounter (HOSPITAL_COMMUNITY): Payer: Self-pay | Admitting: *Deleted

## 2019-10-31 ENCOUNTER — Other Ambulatory Visit: Payer: Self-pay

## 2019-10-31 ENCOUNTER — Emergency Department (HOSPITAL_COMMUNITY): Payer: Medicare Other

## 2019-10-31 ENCOUNTER — Inpatient Hospital Stay (HOSPITAL_COMMUNITY)
Admission: EM | Admit: 2019-10-31 | Discharge: 2019-11-03 | DRG: 291 | Disposition: A | Discharge status: Home / Self Care | Payer: Medicare Other | Attending: Family Medicine | Admitting: Family Medicine

## 2019-10-31 ENCOUNTER — Inpatient Hospital Stay (HOSPITAL_COMMUNITY): Payer: Medicare Other

## 2019-10-31 DIAGNOSIS — I5032 Chronic diastolic (congestive) heart failure: Secondary | ICD-10-CM | POA: Diagnosis present

## 2019-10-31 DIAGNOSIS — E1165 Type 2 diabetes mellitus with hyperglycemia: Secondary | ICD-10-CM | POA: Diagnosis present

## 2019-10-31 DIAGNOSIS — Z794 Long term (current) use of insulin: Secondary | ICD-10-CM | POA: Diagnosis not present

## 2019-10-31 DIAGNOSIS — I132 Hypertensive heart and chronic kidney disease with heart failure and with stage 5 chronic kidney disease, or end stage renal disease: Principal | ICD-10-CM | POA: Diagnosis present

## 2019-10-31 DIAGNOSIS — N189 Chronic kidney disease, unspecified: Secondary | ICD-10-CM

## 2019-10-31 DIAGNOSIS — I130 Hypertensive heart and chronic kidney disease with heart failure and stage 1 through stage 4 chronic kidney disease, or unspecified chronic kidney disease: Secondary | ICD-10-CM | POA: Diagnosis not present

## 2019-10-31 DIAGNOSIS — Z1152 Encounter for screening for COVID-19: Secondary | ICD-10-CM | POA: Diagnosis not present

## 2019-10-31 DIAGNOSIS — Z20822 Contact with and (suspected) exposure to covid-19: Secondary | ICD-10-CM | POA: Diagnosis present

## 2019-10-31 DIAGNOSIS — G4700 Insomnia, unspecified: Secondary | ICD-10-CM | POA: Diagnosis present

## 2019-10-31 DIAGNOSIS — R3912 Poor urinary stream: Secondary | ICD-10-CM | POA: Diagnosis present

## 2019-10-31 DIAGNOSIS — E1122 Type 2 diabetes mellitus with diabetic chronic kidney disease: Secondary | ICD-10-CM | POA: Diagnosis present

## 2019-10-31 DIAGNOSIS — I2510 Atherosclerotic heart disease of native coronary artery without angina pectoris: Secondary | ICD-10-CM | POA: Diagnosis not present

## 2019-10-31 DIAGNOSIS — Z951 Presence of aortocoronary bypass graft: Secondary | ICD-10-CM

## 2019-10-31 DIAGNOSIS — N186 End stage renal disease: Secondary | ICD-10-CM | POA: Diagnosis present

## 2019-10-31 DIAGNOSIS — E669 Obesity, unspecified: Secondary | ICD-10-CM | POA: Diagnosis present

## 2019-10-31 DIAGNOSIS — E872 Acidosis: Secondary | ICD-10-CM | POA: Diagnosis present

## 2019-10-31 DIAGNOSIS — R509 Fever, unspecified: Secondary | ICD-10-CM | POA: Diagnosis not present

## 2019-10-31 DIAGNOSIS — M109 Gout, unspecified: Secondary | ICD-10-CM | POA: Diagnosis present

## 2019-10-31 DIAGNOSIS — N184 Chronic kidney disease, stage 4 (severe): Secondary | ICD-10-CM | POA: Diagnosis not present

## 2019-10-31 DIAGNOSIS — E785 Hyperlipidemia, unspecified: Secondary | ICD-10-CM | POA: Diagnosis present

## 2019-10-31 DIAGNOSIS — Z7982 Long term (current) use of aspirin: Secondary | ICD-10-CM

## 2019-10-31 DIAGNOSIS — I495 Sick sinus syndrome: Secondary | ICD-10-CM | POA: Diagnosis present

## 2019-10-31 DIAGNOSIS — N250 Renal osteodystrophy: Secondary | ICD-10-CM | POA: Diagnosis not present

## 2019-10-31 DIAGNOSIS — I252 Old myocardial infarction: Secondary | ICD-10-CM | POA: Diagnosis not present

## 2019-10-31 DIAGNOSIS — Z992 Dependence on renal dialysis: Secondary | ICD-10-CM

## 2019-10-31 DIAGNOSIS — D631 Anemia in chronic kidney disease: Secondary | ICD-10-CM | POA: Diagnosis present

## 2019-10-31 DIAGNOSIS — Z79899 Other long term (current) drug therapy: Secondary | ICD-10-CM

## 2019-10-31 DIAGNOSIS — N401 Enlarged prostate with lower urinary tract symptoms: Secondary | ICD-10-CM | POA: Diagnosis present

## 2019-10-31 DIAGNOSIS — Z8719 Personal history of other diseases of the digestive system: Secondary | ICD-10-CM

## 2019-10-31 DIAGNOSIS — Z803 Family history of malignant neoplasm of breast: Secondary | ICD-10-CM

## 2019-10-31 DIAGNOSIS — J189 Pneumonia, unspecified organism: Secondary | ICD-10-CM | POA: Diagnosis not present

## 2019-10-31 DIAGNOSIS — I480 Paroxysmal atrial fibrillation: Secondary | ICD-10-CM | POA: Diagnosis not present

## 2019-10-31 DIAGNOSIS — Z6831 Body mass index (BMI) 31.0-31.9, adult: Secondary | ICD-10-CM

## 2019-10-31 DIAGNOSIS — N185 Chronic kidney disease, stage 5: Secondary | ICD-10-CM | POA: Diagnosis not present

## 2019-10-31 DIAGNOSIS — Z8249 Family history of ischemic heart disease and other diseases of the circulatory system: Secondary | ICD-10-CM

## 2019-10-31 DIAGNOSIS — F419 Anxiety disorder, unspecified: Secondary | ICD-10-CM | POA: Diagnosis present

## 2019-10-31 DIAGNOSIS — R5383 Other fatigue: Secondary | ICD-10-CM | POA: Diagnosis not present

## 2019-10-31 DIAGNOSIS — G4733 Obstructive sleep apnea (adult) (pediatric): Secondary | ICD-10-CM | POA: Diagnosis present

## 2019-10-31 DIAGNOSIS — R05 Cough: Secondary | ICD-10-CM | POA: Diagnosis not present

## 2019-10-31 DIAGNOSIS — J440 Chronic obstructive pulmonary disease with acute lower respiratory infection: Secondary | ICD-10-CM | POA: Diagnosis present

## 2019-10-31 DIAGNOSIS — Z87891 Personal history of nicotine dependence: Secondary | ICD-10-CM

## 2019-10-31 DIAGNOSIS — N179 Acute kidney failure, unspecified: Secondary | ICD-10-CM | POA: Diagnosis present

## 2019-10-31 DIAGNOSIS — Z91013 Allergy to seafood: Secondary | ICD-10-CM

## 2019-10-31 DIAGNOSIS — E1129 Type 2 diabetes mellitus with other diabetic kidney complication: Secondary | ICD-10-CM | POA: Diagnosis not present

## 2019-10-31 DIAGNOSIS — Z955 Presence of coronary angioplasty implant and graft: Secondary | ICD-10-CM

## 2019-10-31 DIAGNOSIS — R0602 Shortness of breath: Secondary | ICD-10-CM | POA: Diagnosis not present

## 2019-10-31 DIAGNOSIS — R52 Pain, unspecified: Secondary | ICD-10-CM | POA: Diagnosis not present

## 2019-10-31 DIAGNOSIS — I129 Hypertensive chronic kidney disease with stage 1 through stage 4 chronic kidney disease, or unspecified chronic kidney disease: Secondary | ICD-10-CM | POA: Diagnosis not present

## 2019-10-31 DIAGNOSIS — I509 Heart failure, unspecified: Secondary | ICD-10-CM | POA: Diagnosis not present

## 2019-10-31 DIAGNOSIS — M79604 Pain in right leg: Secondary | ICD-10-CM | POA: Diagnosis present

## 2019-10-31 DIAGNOSIS — I770 Arteriovenous fistula, acquired: Secondary | ICD-10-CM | POA: Diagnosis not present

## 2019-10-31 DIAGNOSIS — R4182 Altered mental status, unspecified: Secondary | ICD-10-CM | POA: Diagnosis not present

## 2019-10-31 LAB — CBC
HCT: 35.2 % — ABNORMAL LOW (ref 39.0–52.0)
Hemoglobin: 11.2 g/dL — ABNORMAL LOW (ref 13.0–17.0)
MCH: 29.8 pg (ref 26.0–34.0)
MCHC: 31.8 g/dL (ref 30.0–36.0)
MCV: 93.6 fL (ref 80.0–100.0)
Platelets: 219 10*3/uL (ref 150–400)
RBC: 3.76 MIL/uL — ABNORMAL LOW (ref 4.22–5.81)
RDW: 15.2 % (ref 11.5–15.5)
WBC: 9.5 10*3/uL (ref 4.0–10.5)
nRBC: 0 % (ref 0.0–0.2)

## 2019-10-31 LAB — BASIC METABOLIC PANEL
Anion gap: 20 — ABNORMAL HIGH (ref 5–15)
BUN: 152 mg/dL — ABNORMAL HIGH (ref 8–23)
CO2: 11 mmol/L — ABNORMAL LOW (ref 22–32)
Calcium: 7.6 mg/dL — ABNORMAL LOW (ref 8.9–10.3)
Chloride: 105 mmol/L (ref 98–111)
Creatinine, Ser: 11.14 mg/dL — ABNORMAL HIGH (ref 0.61–1.24)
GFR calc Af Amer: 4 mL/min — ABNORMAL LOW (ref >60)
GFR calc non Af Amer: 4 mL/min — ABNORMAL LOW (ref >60)
Glucose, Bld: 185 mg/dL — ABNORMAL HIGH (ref 70–99)
Potassium: 4.6 mmol/L (ref 3.5–5.1)
Sodium: 136 mmol/L (ref 135–145)

## 2019-10-31 LAB — HEPATITIS C ANTIBODY: HCV Ab: NONREACTIVE

## 2019-10-31 LAB — HEMOGLOBIN A1C
Hgb A1c MFr Bld: 8.7 % — ABNORMAL HIGH (ref 4.8–5.6)
Mean Plasma Glucose: 202.99 mg/dL

## 2019-10-31 LAB — SARS CORONAVIRUS 2 BY RT PCR (HOSPITAL ORDER, PERFORMED IN ~~LOC~~ HOSPITAL LAB): SARS Coronavirus 2: NEGATIVE

## 2019-10-31 LAB — HEPATITIS B SURFACE ANTIGEN: Hepatitis B Surface Ag: NONREACTIVE

## 2019-10-31 LAB — CBG MONITORING, ED: Glucose-Capillary: 150 mg/dL — ABNORMAL HIGH (ref 70–99)

## 2019-10-31 LAB — HEPATITIS B CORE ANTIBODY, TOTAL: Hep B Core Total Ab: NONREACTIVE

## 2019-10-31 MED ORDER — CALCIUM-MAGNESIUM-VITAMIN D ER 600-40-500 MG-MG-UNIT PO TB24: ORAL_TABLET | Freq: Every day | ORAL | Status: DC

## 2019-10-31 MED ORDER — ROSUVASTATIN CALCIUM 20 MG PO TABS
20.0000 mg | ORAL_TABLET | Freq: Every day | ORAL | Status: DC
  Administered 2019-11-01 – 2019-11-02 (×2): 20 mg via ORAL
  Filled 2019-10-31: qty 1

## 2019-10-31 MED ORDER — BENZONATATE 100 MG PO CAPS
100.0000 mg | ORAL_CAPSULE | Freq: Two times a day (BID) | ORAL | Status: AC
  Administered 2019-10-31 – 2019-11-03 (×6): 100 mg via ORAL
  Filled 2019-10-31 (×6): qty 1

## 2019-10-31 MED ORDER — HEPARIN SODIUM (PORCINE) 5000 UNIT/ML IJ SOLN
5000.0000 [IU] | Freq: Two times a day (BID) | INTRAMUSCULAR | Status: DC
  Administered 2019-10-31 – 2019-11-03 (×6): 5000 [IU] via SUBCUTANEOUS
  Filled 2019-10-31 (×6): qty 1

## 2019-10-31 MED ORDER — GUAIFENESIN ER 600 MG PO TB12
600.0000 mg | ORAL_TABLET | Freq: Two times a day (BID) | ORAL | Status: DC
  Administered 2019-10-31 – 2019-11-03 (×5): 600 mg via ORAL
  Filled 2019-10-31 (×5): qty 1

## 2019-10-31 MED ORDER — SODIUM CHLORIDE 0.9 % IV SOLN
1.0000 g | Freq: Once | INTRAVENOUS | Status: AC
  Administered 2019-10-31: 1 g via INTRAVENOUS
  Filled 2019-10-31: qty 10

## 2019-10-31 MED ORDER — ALLOPURINOL 100 MG PO TABS
200.0000 mg | ORAL_TABLET | Freq: Every day | ORAL | Status: DC
  Administered 2019-10-31 – 2019-11-01 (×2): 200 mg via ORAL
  Filled 2019-10-31 (×2): qty 2

## 2019-10-31 MED ORDER — VITAMIN D 25 MCG (1000 UNIT) PO TABS
2000.0000 [IU] | ORAL_TABLET | Freq: Every day | ORAL | Status: DC
  Administered 2019-10-31 – 2019-11-03 (×3): 2000 [IU] via ORAL
  Filled 2019-10-31 (×4): qty 2

## 2019-10-31 MED ORDER — DOXYCYCLINE HYCLATE 100 MG PO TABS
100.0000 mg | ORAL_TABLET | Freq: Once | ORAL | Status: AC
  Administered 2019-10-31: 100 mg via ORAL
  Filled 2019-10-31: qty 1

## 2019-10-31 MED ORDER — FLUTICASONE PROPIONATE 50 MCG/ACT NA SUSP
1.0000 | Freq: Every day | NASAL | Status: DC
  Administered 2019-11-01 – 2019-11-03 (×2): 1 via NASAL
  Filled 2019-10-31 (×2): qty 16

## 2019-10-31 MED ORDER — ASPIRIN EC 81 MG PO TBEC
81.0000 mg | DELAYED_RELEASE_TABLET | Freq: Every day | ORAL | Status: DC
  Administered 2019-10-31 – 2019-11-03 (×4): 81 mg via ORAL
  Filled 2019-10-31 (×4): qty 1

## 2019-10-31 MED ORDER — INSULIN ASPART 100 UNIT/ML ~~LOC~~ SOLN
0.0000 [IU] | Freq: Three times a day (TID) | SUBCUTANEOUS | Status: DC
  Administered 2019-11-01: 7 [IU] via SUBCUTANEOUS
  Administered 2019-11-01: 3 [IU] via SUBCUTANEOUS
  Administered 2019-11-01: 2 [IU] via SUBCUTANEOUS

## 2019-10-31 MED ORDER — CALCIUM CARBONATE ANTACID 500 MG PO CHEW
1.0000 | CHEWABLE_TABLET | Freq: Once | ORAL | Status: AC
  Administered 2019-10-31: 200 mg via ORAL
  Filled 2019-10-31: qty 1

## 2019-10-31 MED ORDER — ONDANSETRON HCL 4 MG/2ML IJ SOLN
4.0000 mg | Freq: Four times a day (QID) | INTRAMUSCULAR | Status: DC | PRN
  Administered 2019-11-03: 4 mg via INTRAVENOUS
  Filled 2019-10-31: qty 2

## 2019-10-31 MED ORDER — ONDANSETRON HCL 4 MG PO TABS: 4.0000 mg | ORAL_TABLET | Freq: Four times a day (QID) | ORAL | Status: DC | PRN

## 2019-10-31 MED ORDER — HYDRALAZINE HCL 25 MG PO TABS
25.0000 mg | ORAL_TABLET | Freq: Three times a day (TID) | ORAL | Status: DC
  Administered 2019-10-31 – 2019-11-03 (×5): 25 mg via ORAL
  Filled 2019-10-31 (×5): qty 1

## 2019-10-31 MED ORDER — SODIUM BICARBONATE 650 MG PO TABS
1300.0000 mg | ORAL_TABLET | Freq: Two times a day (BID) | ORAL | Status: DC
  Administered 2019-11-01 – 2019-11-02 (×4): 1300 mg via ORAL
  Filled 2019-10-31 (×6): qty 2

## 2019-10-31 MED ORDER — DIPHENHYDRAMINE-APAP (SLEEP) 25-500 MG PO TABS: 0.5000 | ORAL_TABLET | Freq: Every day | ORAL | Status: DC

## 2019-10-31 MED ORDER — ALPRAZOLAM 0.25 MG PO TABS
0.2500 mg | ORAL_TABLET | Freq: Every evening | ORAL | Status: DC | PRN
  Filled 2019-10-31: qty 1

## 2019-10-31 MED ORDER — KETOTIFEN FUMARATE 0.025 % OP SOLN
1.0000 [drp] | Freq: Every day | OPHTHALMIC | Status: DC | PRN
  Administered 2019-11-02: 1 [drp] via OPHTHALMIC
  Filled 2019-10-31: qty 5

## 2019-10-31 MED ORDER — METOPROLOL TARTRATE 25 MG PO TABS
37.5000 mg | ORAL_TABLET | Freq: Two times a day (BID) | ORAL | Status: DC
  Administered 2019-10-31 – 2019-11-03 (×5): 37.5 mg via ORAL
  Filled 2019-10-31: qty 1
  Filled 2019-10-31 (×3): qty 2
  Filled 2019-10-31: qty 1

## 2019-10-31 NOTE — H&P (Signed)
History and Physical    Bradley Soto NTI:[id number] DOB: 24-Apr-1940 DOA: 10/31/2019  PCP: Bradley Hatchet, MD (Confirm with patient/family/NH records and if not entered, this has to be entered at [facility] point of entry) Patient coming from: Home  I have personally briefly reviewed patient's old medical records in [place]  Chief Complaint: Feeling weak  HPI: Bradley Soto is a 79 y.o. male with medical history significant of CKD stage V, hypertension, IDDM, MI status post quadruple bypass 2020, gout, was sent by his nephrologist today to initiate dialysis.  Patient has had worsening of CKD stage V, has been following nephrologist in recent years.  Estimated that the patient will eventually need HD, a right arm AV fistula was established in June 2021, according to vascular surgeon's evaluation, the AV fistula will be ready for use on 10/28/2019.  The most recent nephrology visit, it was found that the patient has worsening of kidney function with more than 3 fold increase of BUN and creatinine levels.  Patient reported has been feeling tired with exertional dyspnea.  There was a plan to start patient on dialysis 1 week ago however patient developed new onset of copious postnasal drip cough and low-grade fever about 10 days ago with a T-max 101-102 at home 3 days in a row, he went to PCP who started patient on cefdinir x7 days which he completed yesterday.  Patient and his wife confirm that the fever subsided about 3 days ago, but cough persisted.  And patient reported streak of blood coughing up for last 2 to 3 days.  He lost appetite since yesterday and has not eaten anything since.  He has BPH and chronic weak stream of urination and he found urine output has decreased since yesterday but denies any abdominal pain or dysuria or back pain. ED Course: No fever, no hypoxia, chest x-ray suspected left-sided infiltrates, BUN 160, creatinine 11, bicarb 11.  Review of Systems: As per HPI otherwise 14  point review of systems negative.    Past Medical History:  Diagnosis Date  . Anxiety   . CAD (coronary artery disease)    s/p stent placement  . CKD (chronic kidney disease), stage III   . Colon polyps   . Diabetes mellitus without complication ([place])   . Gout   . HLD (hyperlipidemia)   . HTN (hypertension)   . Insomnia   . MI (myocardial infarction) ([place]) 1998  . NSTEMI (non-ST elevated myocardial infarction) ([place])    s/p PCI in early 2000s  . OSA on CPAP    wears CPAP  . SSS (sick sinus syndrome) ([place])   . Type II or unspecified type diabetes mellitus without mention of complication, not stated as uncontrolled   . Wears glasses    reading   . Wears partial dentures     Past Surgical History:  Procedure Laterality Date  . APPENDECTOMY    . AV FISTULA PLACEMENT Right 07/28/2019   Procedure: RIGHT ARM Brachiocephalic ARTERIOVENOUS (AV) FISTULA CREATION;  Surgeon: Serafina Mitchell, MD;  Location: [place];  Service: Vascular;  Laterality: Right;  . CATARACT EXTRACTION    . CLIPPING OF ATRIAL APPENDAGE  12/05/2018   Procedure: Clipping Of Atrial Appendage;  Surgeon: Wonda Olds, MD;  Location: [facility] OR;  Service: Open Heart Surgery;;  . CORONARY ANGIOPLASTY WITH STENT PLACEMENT     x4  . CORONARY ARTERY BYPASS GRAFT N/A 12/05/2018   Procedure: CORONARY ARTERY BYPASS GRAFTING (CABG) X FOUR USING LEFT INTERNAL MAMMARY  ARTERY AND RIGHT SAPHENOUS VEIN GRAFTS;  Surgeon: Wonda Olds, MD;  Location: [facility] OR;  Service: Open Heart Surgery;  Laterality: N/A;  . HERNIA REPAIR     ventral hernia repair with mesh--revision also was required  . INSERT / REPLACE / REMOVE PACEMAKER  05/10/2000   pacemaker implanted in [place]  . IR THORACENTESIS ASP PLEURAL SPACE W/IMG GUIDE  12/21/2018  . IR THORACENTESIS ASP PLEURAL SPACE W/IMG GUIDE  12/30/2018  . LEFT HEART CATH AND CORONARY ANGIOGRAPHY N/A 11/30/2018   Procedure: LEFT HEART CATH AND CORONARY ANGIOGRAPHY;  Surgeon: Burnell Blanks, MD;  Location: [place] [facility];  Service: Cardiovascular;  Laterality: N/A;  . MAZE N/A 12/05/2018   Procedure: MAZE;  Surgeon: Wonda Olds, MD;  Location: [place];  Service: Open Heart Surgery;  Laterality: N/A;  . PACEMAKER GENERATOR CHANGE  09/03/09   MDT Adapta generator change in [place]  . PARTIAL COLON REMOVAL  2/2   polyp that could not be removed on colonoscopy  . PPM GENERATOR CHANGEOUT N/A 06/01/2019   Procedure: PPM GENERATOR CHANGEOUT;  Surgeon: [name], MD;  Location: [place] [facility];  Service: Cardiovascular;  Laterality: N/A;  . TEE WITHOUT CARDIOVERSION N/A 12/05/2018   Procedure: TRANSESOPHAGEAL ECHOCARDIOGRAM (TEE);  Surgeon: Wonda Olds, MD;  Location: [place];  Service: Open Heart Surgery;  Laterality: N/A;     reports that he has quit smoking. His smoking use included cigarettes. He has never used smokeless tobacco. He reports that he does not drink alcohol and does not use drugs.  Allergies  Allergen Reactions  . Shellfish Allergy Rash    Family History  Problem Relation Age of Onset  . Heart attack Father 59  . Heart disease Father   . Breast cancer Sister   . Pulmonary embolism Sister   . Deep vein thrombosis Sister      Prior to Admission medications   Medication Sig Start Date End Date Taking? Authorizing Provider  allopurinol (ZYLOPRIM) 100 MG tablet Take 200 mg by mouth daily.     [provider]  ALPRAZolam Duanne Moron) 0.25 MG tablet Take 0.25 mg by mouth at bedtime as needed for sleep. 03/22/19   [provider]  aspirin EC 81 MG tablet Take 81 mg by mouth daily.    [provider]  Calcium-Magnesium-Vitamin D (CITRACAL SLOW RELEASE PO) Take 1 tablet by mouth daily.    [provider]  Cholecalciferol (VITAMIN D-3) 25 MCG (1000 UT) CAPS Take 2,000 Units by mouth daily.     [provider]  diphenhydramine-acetaminophen (TYLENOL PM) 25-500 MG TABS tablet Take 0.5 tablets by mouth  at bedtime.     [provider]  furosemide (LASIX) 40 MG tablet Take 40 mg by mouth daily. 03/11/19   [provider]  hydrALAZINE (APRESOLINE) 25 MG tablet Take 25 mg by mouth 3 (three) times daily.     [provider]  Ketotifen Fumarate (ALLERGY EYE DROPS OP) Place 1 drop into both eyes daily as needed (allergies).    [provider]  metoprolol tartrate (LOPRESSOR) 25 MG tablet Take 1.5 tablets (37.5 mg total) by mouth 2 (two) times daily. 04/28/19 04/27/20  Nahser, [name], MD  NOVOLOG MIX 70/30 FLEXPEN (70-30) 100 UNIT/ML FlexPen Inject 35 Units into the skin 2 (two) times daily with a meal.  02/26/16   [provider]  oxyCODONE-acetaminophen (PERCOCET) 5-325 MG tablet Take 1 tablet by mouth every 4 (four) hours as needed  for severe pain. Patient not taking: Reported on 09/25/2019 07/28/19 07/27/20  Serafina Mitchell, MD  rosuvastatin (CRESTOR) 20 MG tablet TAKE 1 TABLET (20 MG TOTAL) BY MOUTH DAILY AT 6 PM. 01/31/19   Nahser, [name], MD    Physical Exam: Vitals:   10/31/19 1417 10/31/19 1541 10/31/19 1815 10/31/19 1900  BP: 127/66 (!) 134/59 134/75 133/65  Pulse: 67 70 81 84  Resp: 16 15 (!) 23 (!) 26  Temp: (!) 97.5 F (36.4 C) (!) 97.5 F (36.4 C)    TempSrc: Oral Oral    SpO2: 98% 98% 95% 94%  Height:        Constitutional: NAD, calm, comfortable Vitals:   10/31/19 1417 10/31/19 1541 10/31/19 1815 10/31/19 1900  BP: 127/66 (!) 134/59 134/75 133/65  Pulse: 67 70 81 84  Resp: 16 15 (!) 23 (!) 26  Temp: (!) 97.5 F (36.4 C) (!) 97.5 F (36.4 C)    TempSrc: Oral Oral    SpO2: 98% 98% 95% 94%  Height:       Eyes: PERRL, lids and conjunctivae normal ENMT: Mucous membranes are dry. Posterior pharynx clear of any exudate or lesions.Normal dentition.  Neck: normal, supple, no masses, no thyromegaly Respiratory: clear to auscultation bilaterally, no wheezing, no crackles. Normal respiratory effort. No accessory muscle use.    Cardiovascular: Regular rate and rhythm, no murmurs / rubs / gallops. No extremity edema. 2+ pedal pulses. No carotid bruits.  Abdomen: no tenderness, no masses palpated. No hepatosplenomegaly. Bowel sounds positive.  Musculoskeletal: no clubbing / cyanosis. No joint deformity upper and lower extremities. Good ROM, no contractures. Normal muscle tone.  Skin: no rashes, lesions, ulcers. No induration Neurologic: CN 2-12 grossly intact. Sensation intact, DTR normal. Strength 5/5 in all 4.  Psychiatric: Normal judgment and insight. Alert and oriented x 3. Normal mood.     Labs on Admission: I have personally reviewed following labs and imaging studies  CBC: Recent Labs  Lab 10/31/19 1024  WBC 9.5  HGB 11.2*  HCT 35.2*  MCV 93.6  PLT 458   Basic Metabolic Panel: Recent Labs  Lab 10/31/19 1024  NA 136  K 4.6  CL 105  CO2 11*  GLUCOSE 185*  BUN 152*  CREATININE 11.14*  CALCIUM 7.6*   GFR: CrCl cannot be calculated (Unknown ideal weight.). Liver Function Tests: No results for input(s): AST, ALT, ALKPHOS, BILITOT, PROT, ALBUMIN in the last 168 hours. No results for input(s): LIPASE, AMYLASE in the last 168 hours. No results for input(s): AMMONIA in the last 168 hours. Coagulation Profile: No results for input(s): INR, PROTIME in the last 168 hours. Cardiac Enzymes: No results for input(s): CKTOTAL, CKMB, CKMBINDEX, TROPONINI in the last 168 hours. BNP (last 3 results) No results for input(s): PROBNP in the last 8760 hours. HbA1C: No results for input(s): HGBA1C in the last 72 hours. CBG: No results for input(s): GLUCAP in the last 168 hours. Lipid Profile: No results for input(s): CHOL, HDL, LDLCALC, TRIG, CHOLHDL, LDLDIRECT in the last 72 hours. Thyroid Function Tests: No results for input(s): TSH, T4TOTAL, FREET4, T3FREE, THYROIDAB in the last 72 hours. Anemia Panel: No results for input(s): VITAMINB12, FOLATE, FERRITIN, TIBC, IRON, RETICCTPCT in the last 72  hours. Urine analysis:    Component Value Date/Time   COLORURINE YELLOW 12/05/2018 0252   APPEARANCEUR CLEAR 12/05/2018 0252   LABSPEC 1.010 12/05/2018 0252   PHURINE 5.0 12/05/2018 0252   GLUCOSEU NEGATIVE 12/05/2018 0252   HGBUR NEGATIVE 12/05/2018 0252  BILIRUBINUR NEGATIVE 12/05/2018 [place] 12/05/2018 0252   PROTEINUR 100 (A) 12/05/2018 0252   NITRITE NEGATIVE 12/05/2018 0252   LEUKOCYTESUR NEGATIVE 12/05/2018 0252    Radiological Exams on Admission: DG Chest Portable 1 View  Result Date: 10/31/2019 CLINICAL DATA:  Cough and fever EXAM: PORTABLE CHEST 1 VIEW COMPARISON:  January 06, 2019 FINDINGS: The heart size and mediastinal contours are within normal limits. Hazy patchy airspace opacity is seen at the left lung base. The right lung is clear. A left-sided pacemaker seen with the lead tips in the right atrium right ventricle. No acute osseous abnormality. IMPRESSION: Hazy airspace opacity at the left lung base which could be due to atelectasis and/or infectious etiology. Electronically Signed   By: Prudencio Pair M.D.   On: 10/31/2019 18:09    EKG: Independently reviewed.  Chronic RBBB, chronic ST-T changes  Assessment/Plan Active Problems:   AKI (acute kidney injury) ([place])  (please populate well all problems here in Problem List. (For example, if patient is on BP meds at home and you resume or decide to hold them, it is a problem that needs to be her. Same for CAD, COPD, HLD and so on)  AKI on CKD stage V -Discussed with on-call nephrology Dr. Candiss Norse, plan to initiate HD tomorrow -Start bicarb p.o. to treat metabolic acidosis tonight -Generalized weakness likely secondary to uremia, non-anion gap metabolic acidosis, treatment will be HD -No symptoms signs of uremic encephalopathy or pericarditis, had mild bleeding associated with cough but H&H has been stable. -Check renal ultrasound to rule out obstructions and the PVR -Case was discussed with patient's  wife over the phone, all questions answered  Recent sinusitis and CAP -Completed treatment of 7 days outpatient antibiotics, fever subsided.  Will not reinitiate antibiotic treatment, infiltrates on the x-ray probably the residual pneumonia, consider repeat chest x-ray in 3 to 4 weeks to document resolution.  IDDM -Given the significant AKI, will discontinue long-acting insulin -Sliding scale for now  HTN -Hold Lasix -Continue hydralazine and metoprolol  CAD -No acute issue, continue aspirin and statin and beta-blocker  DVT prophylaxis: Heparin subcu Code Status: Full code Family Communication: Wife over the phone Disposition Plan: Expect HD start tomorrow and probably 2 days in a row, for uremia and severe metabolic acidosis treatment, expect for more than 2 midnight hospital stay. Consults called: Nephrology Admission status: PCU   Lequita Halt MD [facility] Pager [phone]  10/31/2019, 7:07 PM

## 2019-10-31 NOTE — ED Notes (Signed)
Spoke with wife regarding pt update, pt home CPAP set up at bedside for use, pt made aware to use call bell when ready for bed for to ensure pt properly placed mask.

## 2019-10-31 NOTE — ED Notes (Signed)
Pt requesting meal tray and tums for indigestion, provider aware awaiting orders for tums. Verbal diet order given at bedside.

## 2019-10-31 NOTE — ED Triage Notes (Signed)
Pt sent here for ARF with elevated Cr. Pts only complaint is generalized weakness. Had fistula placed in august.

## 2019-10-31 NOTE — ED Provider Notes (Signed)
[facility] EMERGENCY DEPARTMENT Provider Note   CSN: [id number] Arrival date & time: 10/31/19  8937     History Chief Complaint  Patient presents with  . Abnormal Lab  . Weakness    Bradley Soto is a 79 y.o. male with pertinent past medical history of chronic renal insufficiency stage IV, CAD status post non-STEMI and stents, diabetes presents to the ER for evaluation of abnormal labs.  He had blood work earlier today and was told that he is in renal failure.  Patient reports for the last 1.5 weeks he has had a "virus".  Reports fevers of 101-102, that broke 3 to 4 days ago.  Other associated symptoms include a dry cough, shortness of breath, generalized malaise, lightheadedness.  States he has no energy or strength to get himself out of his chair.  States his doctor ran a lot of tests and tested negative for Covid.  States his fever is now resolved.  Also states his cough is a little bit better.  He has continued generalized malaise and fatigue.  He is fully vaccinated for COVID-19.  Denies sick contacts.  He had a right upper arm fistula 07/2019 in preparation for his worsening kidney function.  Denies any chest pain, nausea, vomiting, abdominal pain, diarrhea.  States he has been eating and drinking at his baseline.  Has noticed that it is harder to produce urine, he has urinated once so far today.  No dysuria.  States out in the waiting room he had some chills.  HPI     Past Medical History:  Diagnosis Date  . Anxiety   . CAD (coronary artery disease)    s/p stent placement  . CKD (chronic kidney disease), stage III   . Colon polyps   . Diabetes mellitus without complication ([place])   . Gout   . HLD (hyperlipidemia)   . HTN (hypertension)   . Insomnia   . MI (myocardial infarction) ([place]) 1998  . NSTEMI (non-ST elevated myocardial infarction) ([place])    s/p PCI in early 2000s  . OSA on CPAP    wears CPAP  . SSS (sick sinus syndrome) ([place])   . Type II or  unspecified type diabetes mellitus without mention of complication, not stated as uncontrolled   . Wears glasses    reading   . Wears partial dentures     Patient Active Problem List   Diagnosis Date Noted  . AKI (acute kidney injury) ([place]) 10/31/2019  . S/P CABG x 4   . Coronary artery disease 12/05/2018  . Unstable angina ([place]) 11/30/2018  . Cough 03/11/2017  . HTN (hypertension) 03/11/2017  . Influenza A 03/11/2017  . Demand ischemia ([place])   . HLD (hyperlipidemia) 03/10/2017  . OSA on CPAP 03/10/2017  . Chest pain 03/10/2017  . Type II diabetes mellitus with renal manifestations ([place]) 03/10/2017  . CKD (chronic kidney disease), stage IV ([place]) 03/10/2017  . Sick sinus syndrome ([place]) 10/16/2013  . CAD (coronary artery disease) 10/10/2013  . Atrial fibrillation ([place]) 10/10/2013  . Gout 10/10/2013    Past Surgical History:  Procedure Laterality Date  . APPENDECTOMY    . AV FISTULA PLACEMENT Right 07/28/2019   Procedure: RIGHT ARM Brachiocephalic ARTERIOVENOUS (AV) FISTULA CREATION;  Surgeon: Serafina Mitchell, MD;  Location: [place];  Service: Vascular;  Laterality: Right;  . CATARACT EXTRACTION    . CLIPPING OF ATRIAL APPENDAGE  12/05/2018   Procedure: Clipping Of Atrial Appendage;  Surgeon: Wonda Olds, MD;  Location: [facility] OR;  Service: Open Heart Surgery;;  . CORONARY ANGIOPLASTY WITH STENT PLACEMENT     x4  . CORONARY ARTERY BYPASS GRAFT N/A 12/05/2018   Procedure: CORONARY ARTERY BYPASS GRAFTING (CABG) X FOUR USING LEFT INTERNAL MAMMARY ARTERY AND RIGHT SAPHENOUS VEIN GRAFTS;  Surgeon: Wonda Olds, MD;  Location: [place];  Service: Open Heart Surgery;  Laterality: N/A;  . HERNIA REPAIR     ventral hernia repair with mesh--revision also was required  . INSERT / REPLACE / REMOVE PACEMAKER  05/10/2000   pacemaker implanted in [place]  . IR THORACENTESIS ASP PLEURAL SPACE W/IMG GUIDE  12/21/2018  . IR THORACENTESIS ASP PLEURAL SPACE W/IMG GUIDE  12/30/2018  . LEFT  HEART CATH AND CORONARY ANGIOGRAPHY N/A 11/30/2018   Procedure: LEFT HEART CATH AND CORONARY ANGIOGRAPHY;  Surgeon: Burnell Blanks, MD;  Location: [place] [facility];  Service: Cardiovascular;  Laterality: N/A;  . MAZE N/A 12/05/2018   Procedure: MAZE;  Surgeon: Wonda Olds, MD;  Location: [place];  Service: Open Heart Surgery;  Laterality: N/A;  . PACEMAKER GENERATOR CHANGE  09/03/09   MDT Adapta generator change in [place]  . PARTIAL COLON REMOVAL  2/2   polyp that could not be removed on colonoscopy  . PPM GENERATOR CHANGEOUT N/A 06/01/2019   Procedure: PPM GENERATOR CHANGEOUT;  Surgeon: [name], MD;  Location: [place] [facility];  Service: Cardiovascular;  Laterality: N/A;  . TEE WITHOUT CARDIOVERSION N/A 12/05/2018   Procedure: TRANSESOPHAGEAL ECHOCARDIOGRAM (TEE);  Surgeon: Wonda Olds, MD;  Location: [place];  Service: Open Heart Surgery;  Laterality: N/A;       Family History  Problem Relation Age of Onset  . Heart attack Father 10  . Heart disease Father   . Breast cancer Sister   . Pulmonary embolism Sister   . Deep vein thrombosis Sister     Social History   Tobacco Use  . Smoking status: Former Smoker    Types: Cigarettes  . Smokeless tobacco: Never Used  . Tobacco comment: " many years ago"  Vaping Use  . Vaping Use: Never used  Substance Use Topics  . Alcohol use: No  . Drug use: No    Home Medications Prior to Admission medications   Medication Sig Start Date End Date Taking? Authorizing Provider  allopurinol (ZYLOPRIM) 100 MG tablet Take 200 mg by mouth daily.     [provider]  ALPRAZolam Duanne Moron) 0.25 MG tablet Take 0.25 mg by mouth at bedtime as needed for sleep. 03/22/19   [provider]  aspirin EC 81 MG tablet Take 81 mg by mouth daily.    [provider]  Calcium-Magnesium-Vitamin D (CITRACAL SLOW RELEASE PO) Take 1 tablet by mouth daily.    [provider]  Cholecalciferol (VITAMIN D-3) 25  MCG (1000 UT) CAPS Take 2,000 Units by mouth daily.     [provider]  diphenhydramine-acetaminophen (TYLENOL PM) 25-500 MG TABS tablet Take 0.5 tablets by mouth at bedtime.     [provider]  furosemide (LASIX) 40 MG tablet Take 40 mg by mouth daily. 03/11/19   [provider]  hydrALAZINE (APRESOLINE) 25 MG tablet Take 25 mg by mouth 3 (three) times daily.     [provider]  Ketotifen Fumarate (ALLERGY EYE DROPS OP) Place 1 drop into both eyes daily as needed (allergies).    [provider]  metoprolol tartrate (LOPRESSOR) 25 MG tablet Take 1.5 tablets (37.5 mg total)  by mouth 2 (two) times daily. 04/28/19 04/27/20  Nahser, [name], MD  NOVOLOG MIX 70/30 FLEXPEN (70-30) 100 UNIT/ML FlexPen Inject 35 Units into the skin 2 (two) times daily with a meal.  02/26/16   [provider]  oxyCODONE-acetaminophen (PERCOCET) 5-325 MG tablet Take 1 tablet by mouth every 4 (four) hours as needed for severe pain. Patient not taking: Reported on 09/25/2019 07/28/19 07/27/20  Serafina Mitchell, MD  rosuvastatin (CRESTOR) 20 MG tablet TAKE 1 TABLET (20 MG TOTAL) BY MOUTH DAILY AT 6 PM. 01/31/19   Nahser, [name], MD    Allergies    Shellfish allergy  Review of Systems   Review of Systems  Constitutional: Positive for fatigue and fever (resolved).  Respiratory: Positive for cough and shortness of breath.   Neurological: Positive for weakness (generalized).  All other systems reviewed and are negative.   Physical Exam Updated Vital Signs BP 134/75   Pulse 81   Temp (!) 97.5 F (36.4 C) (Oral)   Resp (!) 23   Ht 5' 11.75" (1.822 m)   SpO2 95%   BMI 31.68 kg/m   Physical Exam Vitals and nursing note reviewed.  Constitutional:      General: He is not in acute distress.    Appearance: He is well-developed.     Comments: NAD.  HENT:     Head: Normocephalic and atraumatic.     Right Ear: External ear normal.     Left Ear: External ear normal.       Nose: Nose normal.  Eyes:     Conjunctiva/sclera: Conjunctivae normal.  Cardiovascular:     Rate and Rhythm: Normal rate and regular rhythm.     Heart sounds: Normal heart sounds. No murmur heard.      Comments: No lower extremity edema.  No calf tenderness.  Questionable murmur noted.  Palpable thrill in the right brachial fistula. Pulmonary:     Effort: Pulmonary effort is normal.     Comments: Dry cough noted on exam.  Upper lobes clear.  Diminished air sounds in middle/lower lobes bilaterally.  No crackles.  Speaking in full sentences. Musculoskeletal:        General: No deformity. Normal range of motion.     Cervical back: Normal range of motion and neck supple.  Skin:    General: Skin is warm and dry.     Capillary Refill: Capillary refill takes less than 2 seconds.  Neurological:     Mental Status: He is alert and oriented to person, place, and time.  Psychiatric:        Behavior: Behavior normal.        Thought Content: Thought content normal.        Judgment: Judgment normal.     ED Results / Procedures / Treatments   Labs (all labs ordered are listed, but only abnormal results are displayed) Labs Reviewed  BASIC METABOLIC PANEL - Abnormal; Notable for the following components:      Result Value   CO2 11 (*)    Glucose, Bld 185 (*)    BUN 152 (*)    Creatinine, Ser 11.14 (*)    Calcium 7.6 (*)    GFR calc non Af Amer 4 (*)    GFR calc Af Amer 4 (*)    Anion gap 20 (*)    All other components within normal limits  CBC - Abnormal; Notable for the following components:   RBC 3.76 (*)    Hemoglobin 11.2 (*)  HCT 35.2 (*)    All other components within normal limits  SARS CORONAVIRUS 2 BY RT PCR (HOSPITAL ORDER, [place] [facility])  CULTURE, BLOOD (ROUTINE X 2)  CULTURE, BLOOD (ROUTINE X 2)  URINALYSIS, ROUTINE W REFLEX MICROSCOPIC    EKG EKG Interpretation  Date/Time:  Tuesday October 31 2019 10:17:41 EDT Ventricular Rate:   67 PR Interval:  190 QRS Duration: 156 QT Interval:  480 QTC Calculation: 507 R Axis:   -18 Text Interpretation: Normal sinus rhythm Right bundle branch block Possible Lateral infarct , age undetermined Inferior infarct , age undetermined No significant change since last tracing Confirmed by Blanchie Dessert [phone]) on 10/31/2019 5:19:39 PM   Radiology DG Chest Portable 1 View  Result Date: 10/31/2019 CLINICAL DATA:  Cough and fever EXAM: PORTABLE CHEST 1 VIEW COMPARISON:  January 06, 2019 FINDINGS: The heart size and mediastinal contours are within normal limits. Hazy patchy airspace opacity is seen at the left lung base. The right lung is clear. A left-sided pacemaker seen with the lead tips in the right atrium right ventricle. No acute osseous abnormality. IMPRESSION: Hazy airspace opacity at the left lung base which could be due to atelectasis and/or infectious etiology. Electronically Signed   By: Prudencio Pair M.D.   On: 10/31/2019 18:09    Procedures .Critical Care Performed by: Kinnie Feil, PA-C Authorized by: Kinnie Feil, PA-C   Critical care provider statement:    Critical care time (minutes):  45   Critical care was necessary to treat or prevent imminent or life-threatening deterioration of the following conditions:  Renal failure   Critical care was time spent personally by me on the following activities:  Discussions with consultants, evaluation of patient's response to treatment, examination of patient, ordering and performing treatments and interventions, ordering and review of laboratory studies, ordering and review of radiographic studies, pulse oximetry, re-evaluation of patient's condition, obtaining history from patient or surrogate, review of old charts and development of treatment plan with patient or surrogate   I assumed direction of critical care for this patient from another provider in my specialty: no     (including critical care  time)  Medications Ordered in ED Medications  cefTRIAXone (ROCEPHIN) 1 g in sodium chloride 0.9 % 100 mL IVPB (1 g Intravenous New Bag/Given 10/31/19 1834)  doxycycline (VIBRA-TABS) tablet 100 mg (100 mg Oral Given 10/31/19 1835)    ED Course  I have reviewed the triage vital signs and the nursing notes.  Pertinent labs & imaging results that were available during my care of the patient were reviewed by me and considered in my medical decision making (see chart for details).  Clinical Course as of Oct 31 1855  Tue Oct 31, 2019  1719 SpO2: 98 % [CG]  1719 Creatinine(!): 11.14 [CG]  1719 BUN(!): 152 [CG]  1719 Anion gap(!): 20 [CG]  1719 BUN(!): 152 [CG]  1753 Spoke to Dr Candiss Norse with nephrology recommends hospital admission for likely HD.  Per report there was some concern for sepsis as well.  Patient here with no fever, tachycardia, WBC.  Will add blood culture given new fistula, CXR. Willa dmit    [CG]  7867 IMPRESSION: Hazy airspace opacity at the left lung base which could be due to atelectasis and/or infectious etiology.  DG Chest Portable 1 View [CG]    Clinical Course User Index [CG] Arlean Hopping   MDM Rules/Calculators/A&P  79 year old male with known CKD presents for worsening renal function after a week of fever, cough, generalized malaise.  I obtained additional history from triage, nursing notes and review of medical chart.  Previous medical records available, nursing notes reviewed to obtain more history and assist with MDM  His nephrologist is Dr. Hollie Salk, her notes are unavailable.  Seen by vascular surgery recently and right brachial fistula ready for cannulation on 10/28/2019.  Baseline creatinine ~3-4.  Initial ER work up including laboratory studies and imaging ordered in triage by RN.    I have personally visualized and interpreted ER diagnostic work up including labs and imaging.    ER work up reveals acute on chronic renal  failure creatinine 11.14, his previous baseline is around 3-4.  BUN 142, anion gap 20, bicarb is 11.  His potassium is 4.6. EKG unchanged.  CXR shows left lower lobe atelectasis vs opacity.   I have ordered COVID test.   Given fever at home, cough, age, risk for complication will give antibiotics.    I ordered medications including doxycycline, rocephin.    I have ordered continuous cardiac monitoring, pulse ox.  Will plan for serial re-examinations. Close monitoring.   I have re-evaluated the patient.   No clinical decline.   Overall, clinical presentation and ER work up most suggestive of worsening renal function potentially requiring HD.  Respiratory illness could've been precipitating factor.   Consulted Dr Candiss Norse with nephrology who will see patient, agrees with admission.  There was concern about possible   Consulted Dr Roosevelt Locks with hospitalist who will admit patient.   Consults made in the ED: nephrology, medicine  Critical interventions in the ED: IV antibiotics, admission for AKI  Discussed with EDP.  Final Clinical Impression(s) / ED Diagnoses Final diagnoses:  Acute renal failure superimposed on chronic kidney disease, unspecified CKD stage, unspecified acute renal failure type [facility])    Rx / DC Orders ED Discharge Orders    None       Arlean Hopping 10/31/19 1910    Blanchie Dessert, MD 10/31/19 2257

## 2019-10-31 NOTE — Progress Notes (Signed)
Pt has his home CPAP but stated he wanted to wait to see if he was getting a room soon so he wanted to wait.

## 2019-11-01 DIAGNOSIS — N186 End stage renal disease: Secondary | ICD-10-CM | POA: Diagnosis present

## 2019-11-01 LAB — CBG MONITORING, ED
Glucose-Capillary: 204 mg/dL — ABNORMAL HIGH (ref 70–99)
Glucose-Capillary: 193 mg/dL — ABNORMAL HIGH (ref 70–99)
Glucose-Capillary: 333 mg/dL — ABNORMAL HIGH (ref 70–99)
Glucose-Capillary: 304 mg/dL — ABNORMAL HIGH (ref 70–99)

## 2019-11-01 LAB — BASIC METABOLIC PANEL
Anion gap: 18 — ABNORMAL HIGH (ref 5–15)
BUN: 157 mg/dL — ABNORMAL HIGH (ref 8–23)
CO2: 13 mmol/L — ABNORMAL LOW (ref 22–32)
Calcium: 7.4 mg/dL — ABNORMAL LOW (ref 8.9–10.3)
Chloride: 106 mmol/L (ref 98–111)
Creatinine, Ser: 11.27 mg/dL — ABNORMAL HIGH (ref 0.61–1.24)
GFR calc Af Amer: 4 mL/min — ABNORMAL LOW (ref >60)
GFR calc non Af Amer: 4 mL/min — ABNORMAL LOW (ref >60)
Glucose, Bld: 226 mg/dL — ABNORMAL HIGH (ref 70–99)
Potassium: 4.6 mmol/L (ref 3.5–5.1)
Sodium: 137 mmol/L (ref 135–145)

## 2019-11-01 LAB — CBC
HCT: 31.1 % — ABNORMAL LOW (ref 39.0–52.0)
Hemoglobin: 10.2 g/dL — ABNORMAL LOW (ref 13.0–17.0)
MCH: 30.7 pg (ref 26.0–34.0)
MCHC: 32.8 g/dL (ref 30.0–36.0)
MCV: 93.7 fL (ref 80.0–100.0)
Platelets: 218 10*3/uL (ref 150–400)
RBC: 3.32 MIL/uL — ABNORMAL LOW (ref 4.22–5.81)
RDW: 15.3 % (ref 11.5–15.5)
WBC: 8.3 10*3/uL (ref 4.0–10.5)
nRBC: 0 % (ref 0.0–0.2)

## 2019-11-01 MED ORDER — CHLORHEXIDINE GLUCONATE CLOTH 2 % EX PADS: 6.0000 | MEDICATED_PAD | Freq: Every day | CUTANEOUS | Status: DC

## 2019-11-01 NOTE — Progress Notes (Signed)
Renal Navigator received message from ED CSW stating that she has been consulted for new HD patient needing OP HD seat. Patient being admitted and Navigator will await notification from Nephrologist to refer patient for OP HD treatment.  Alphonzo Cruise, [place] Renal Navigator  [phone]

## 2019-11-01 NOTE — ED Notes (Signed)
CBG 204. 

## 2019-11-01 NOTE — ED Notes (Signed)
Pt transferred to hospital bed for comfort.

## 2019-11-01 NOTE — Progress Notes (Signed)
CSW received consult for patient to assist with obtaining an outpatient HD seat. CSW notified Renal Navigator [place], [place] of request.  Madilyn Fireman, MSW, LCSW-A Transitions of Care   Clinical Social Worker  [facility] Emergency Departments   Medical ICU [phone]

## 2019-11-01 NOTE — ED Notes (Signed)
Pt moved to HD,

## 2019-11-01 NOTE — Progress Notes (Signed)
PT Cancellation Note  Patient Details Name: Asher Torpey MRN: [id number] DOB: 03/09/1940   Cancelled Treatment:    Reason Eval/Treat Not Completed: Patient at procedure or test/unavailable Pt currently at HD. Will follow up as schedule allows.   Lou Miner, DPT  Acute Rehabilitation Services  Pager: [phone] Office: [phone]  Rudean Hitt 11/01/2019, 3:27 PM

## 2019-11-01 NOTE — Consult Note (Signed)
[facility] Renal Consultation Note  Requesting MD: Berle Mull, MD Indication for Consultation: Advanced renal failure  Chief complaint: Directed to the ER after outpatient lab  HPI:  Bradley Soto is a 79 y.o. male 3 of hypertension, diabetes, CKD stage IV/V, coronary artery disease, OSA and history of sick sinus syndrome who presented to the hospital after outpatient labs.  The patient states that he had seen his PCP because they thought that he had some type of infection.  He was told to come to the ER after getting outpatient labs due to his blood work with markedly elevated BUN and creatinine.  He states that he has been eating and drinking okay.  He denies any confusion.  He denies any difficulty urinating.  He states that he is somewhat short of breath with exertion but not at rest.  No chest pain.  He was told that his right arm fistula would be ready to use when needed.  We have discussed the indications for dialysis and he consents to proceed with dialysis.  He lives near [place] and will share this information with the HD social worker  Creatinine, Ser  Date/Time Value Ref Range Status  11/01/2019 04:10 AM 11.27 (H) 0.61 - 1.24 mg/dL Final  10/31/2019 10:24 AM 11.14 (H) 0.61 - 1.24 mg/dL Final  07/28/2019 07:48 AM 3.80 (H) 0.61 - 1.24 mg/dL Final  05/30/2019 09:23 AM 3.91 (H) 0.76 - 1.27 mg/dL Final  12/11/2018 02:54 AM 4.43 (H) 0.61 - 1.24 mg/dL Final  12/10/2018 02:46 AM 4.40 (H) 0.61 - 1.24 mg/dL Final  12/09/2018 04:10 AM 4.62 (H) 0.61 - 1.24 mg/dL Final  12/08/2018 05:25 AM 4.13 (H) 0.61 - 1.24 mg/dL Final  12/07/2018 03:09 PM 4.09 (H) 0.61 - 1.24 mg/dL Final  12/07/2018 03:09 AM 3.76 (H) 0.61 - 1.24 mg/dL Final  12/06/2018 04:00 PM 3.10 (H) 0.61 - 1.24 mg/dL Final  12/06/2018 06:34 AM 2.87 (H) 0.61 - 1.24 mg/dL Final  12/05/2018 08:04 PM 3.03 (H) 0.61 - 1.24 mg/dL Final  12/05/2018 12:35 PM 2.50 (H) 0.61 - 1.24 mg/dL Final  12/05/2018 11:29 AM 2.70 (H)  0.61 - 1.24 mg/dL Final  12/05/2018 10:32 AM 2.70 (H) 0.61 - 1.24 mg/dL Final  12/05/2018 09:29 AM 3.00 (H) 0.61 - 1.24 mg/dL Final  12/05/2018 08:02 AM 2.90 (H) 0.61 - 1.24 mg/dL Final  12/05/2018 03:23 AM 3.46 (H) 0.61 - 1.24 mg/dL Final  12/04/2018 05:33 AM 3.26 (H) 0.61 - 1.24 mg/dL Final  12/03/2018 04:46 AM 3.18 (H) 0.61 - 1.24 mg/dL Final  12/02/2018 03:33 AM 2.98 (H) 0.61 - 1.24 mg/dL Final  12/01/2018 05:10 AM 2.72 (H) 0.61 - 1.24 mg/dL Final  11/30/2018 11:27 AM 2.76 (H) 0.61 - 1.24 mg/dL Final  11/28/2018 02:48 PM 3.29 (H) 0.76 - 1.27 mg/dL Final  11/24/2018 04:05 PM 3.46 (H) 0.61 - 1.24 mg/dL Final  03/12/2017 07:54 AM 3.35 (H) 0.61 - 1.24 mg/dL Final  03/10/2017 08:44 PM 2.85 (H) 0.61 - 1.24 mg/dL Final  04/03/2016 09:58 AM 3.20 (H) 0.76 - 1.27 mg/dL Final  04/01/2016 01:03 PM 3.38 (H) 0.76 - 1.27 mg/dL Final  10/16/2013 11:24 AM 2.2 (H) 0.40 - 1.50 mg/dL Final  10/10/2013 10:17 AM 2.6 (H) 0.40 - 1.50 mg/dL Final     PMHx:   Past Medical History:  Diagnosis Date   Anxiety    CAD (coronary artery disease)    s/p stent placement   CKD (chronic kidney disease)    Colon polyps  Diabetes mellitus without complication ([place])    Gout    HLD (hyperlipidemia)    HTN (hypertension)    Insomnia    MI (myocardial infarction) ([place]) 1998   NSTEMI (non-ST elevated myocardial infarction) ([place])    s/p PCI in early 2000s   OSA on CPAP    wears CPAP   SSS (sick sinus syndrome) ([facility])    Type II or unspecified type diabetes mellitus without mention of complication, not stated as uncontrolled    Wears glasses    reading    Wears partial dentures     Past Surgical History:  Procedure Laterality Date   APPENDECTOMY     AV FISTULA PLACEMENT Right 07/28/2019   Procedure: RIGHT ARM Brachiocephalic ARTERIOVENOUS (AV) FISTULA CREATION;  Surgeon: Serafina Mitchell, MD;  Location: [facility] OR;  Service: Vascular;  Laterality: Right;   CATARACT EXTRACTION     CLIPPING  OF ATRIAL APPENDAGE  12/05/2018   Procedure: Clipping Of Atrial Appendage;  Surgeon: Wonda Olds, MD;  Location: [facility] OR;  Service: Open Heart Surgery;;   CORONARY ANGIOPLASTY WITH STENT PLACEMENT     x4   CORONARY ARTERY BYPASS GRAFT N/A 12/05/2018   Procedure: CORONARY ARTERY BYPASS GRAFTING (CABG) X FOUR USING LEFT INTERNAL MAMMARY ARTERY AND RIGHT SAPHENOUS VEIN GRAFTS;  Surgeon: Wonda Olds, MD;  Location: [facility];  Service: Open Heart Surgery;  Laterality: N/A;   HERNIA REPAIR     ventral hernia repair with mesh--revision also was required   INSERT / REPLACE / REMOVE PACEMAKER  05/10/2000   pacemaker implanted in [place]]   IR THORACENTESIS ASP PLEURAL SPACE W/IMG GUIDE  12/21/2018   IR THORACENTESIS ASP PLEURAL SPACE W/IMG GUIDE  12/30/2018   LEFT HEART CATH AND CORONARY ANGIOGRAPHY N/A 11/30/2018   Procedure: LEFT HEART CATH AND CORONARY ANGIOGRAPHY;  Surgeon: Burnell Blanks, MD;  Location: [facility];  Service: Cardiovascular;  Laterality: N/A;   MAZE N/A 12/05/2018   Procedure: MAZE;  Surgeon: Wonda Olds, MD;  Location: [facility];  Service: Open Heart Surgery;  Laterality: N/A;   PACEMAKER GENERATOR CHANGE  09/03/09   MDT Adapta generator change in [place]  2/2   polyp that could not be removed on colonoscopy   PPM GENERATOR CHANGEOUT N/A 06/01/2019   Procedure: PPM GENERATOR CHANGEOUT;  Surgeon: [name], MD;  Location: [facility] CV [facility];  Service: Cardiovascular;  Laterality: N/A;   TEE WITHOUT CARDIOVERSION N/A 12/05/2018   Procedure: TRANSESOPHAGEAL ECHOCARDIOGRAM (TEE);  Surgeon: Wonda Olds, MD;  Location: [facility];  Service: Open Heart Surgery;  Laterality: N/A;    Family Hx:  Family History  Problem Relation Age of Onset   Heart attack Father 57   Heart disease Father    Breast cancer Sister    Pulmonary embolism Sister    Deep vein thrombosis Sister     Social History:  reports that he has  quit smoking. His smoking use included cigarettes. He has never used smokeless tobacco. He reports that he does not drink alcohol and does not use drugs.  Allergies:  Allergies  Allergen Reactions   Shellfish Allergy Rash    Medications: Prior to Admission medications   Medication Sig Start Date End Date Taking? Authorizing Provider  allopurinol (ZYLOPRIM) 100 MG tablet Take 200 mg by mouth 2 (two) times daily.    Yes [provider]  ALPRAZolam (XANAX) 0.25 MG tablet Take 0.25 mg by mouth at bedtime  as needed for sleep. 03/22/19  Yes [provider]  aspirin EC 81 MG tablet Take 81 mg by mouth daily.   Yes [provider]  Calcium-Magnesium-Vitamin D (CITRACAL SLOW RELEASE PO) Take 1 tablet by mouth daily.   Yes [provider]  Cholecalciferol (VITAMIN D-3) 25 MCG (1000 UT) CAPS Take 2,000 Units by mouth daily.    Yes [provider]  diphenhydramine-acetaminophen (TYLENOL PM) 25-500 MG TABS tablet Take 0.5 tablets by mouth at bedtime.    Yes [provider]  furosemide (LASIX) 40 MG tablet Take 40 mg by mouth daily. 03/11/19  Yes [provider]  hydrALAZINE (APRESOLINE) 25 MG tablet Take 25 mg by mouth in the morning and at bedtime.    Yes [provider]  Ketotifen Fumarate (ALLERGY EYE DROPS OP) Place 1 drop into both eyes daily as needed (allergies).   Yes [provider]  metoprolol tartrate (LOPRESSOR) 25 MG tablet Take 1.5 tablets (37.5 mg total) by mouth 2 (two) times daily. 04/28/19 04/27/20 Yes Nahser, [name], MD  NOVOLOG MIX 70/30 FLEXPEN (70-30) 100 UNIT/ML FlexPen Inject 35 Units into the skin 2 (two) times daily with a meal.  02/26/16  Yes [provider]  rosuvastatin (CRESTOR) 20 MG tablet TAKE 1 TABLET (20 MG TOTAL) BY MOUTH DAILY AT 6 PM. 01/31/19  Yes Nahser, [name], MD  oxyCODONE-acetaminophen (PERCOCET) 5-325 MG tablet Take 1 tablet by mouth every 4 (four) hours as needed for severe  pain. Patient not taking: Reported on 09/25/2019 07/28/19 07/27/20  Serafina Mitchell, MD    I have reviewed the patient's current medications.  Labs:  BMP Latest Ref Rng & Units 11/01/2019 10/31/2019 07/28/2019  Glucose 70 - 99 mg/dL 226(H) 185(H) 155(H)  BUN 8 - 23 mg/dL 157(H) 152(H) 65(H)  Creatinine 0.61 - 1.24 mg/dL 11.27(H) 11.14(H) 3.80(H)  BUN/Creat Ratio 10 - 24 - - -  Sodium 135 - 145 mmol/L 137 136 140  Potassium 3.5 - 5.1 mmol/L 4.6 4.6 5.0  Chloride 98 - 111 mmol/L 106 105 113(H)  CO2 22 - 32 mmol/L 13(L) 11(L) -  Calcium 8.9 - 10.3 mg/dL 7.4(L) 7.6(L) -    Urinalysis    Component Value Date/Time   COLORURINE YELLOW 12/05/2018 0252   APPEARANCEUR CLEAR 12/05/2018 0252   LABSPEC 1.010 12/05/2018 0252   PHURINE 5.0 12/05/2018 0252   GLUCOSEU NEGATIVE 12/05/2018 0252   HGBUR NEGATIVE 12/05/2018 0252   BILIRUBINUR NEGATIVE 12/05/2018 [place] 12/05/2018 0252   PROTEINUR 100 (A) 12/05/2018 0252   NITRITE NEGATIVE 12/05/2018 0252   LEUKOCYTESUR NEGATIVE 12/05/2018 0252     ROS:  Pertinent items noted in HPI and remainder of comprehensive ROS otherwise negative.   Physical Exam: Vitals:   11/01/19 0900 11/01/19 0904  BP: 121/66   Pulse: 73   Resp: 16   Temp:  (!) 97.5 F (36.4 C)  SpO2: 96%      General: Adult male in stretcher in no acute distress at rest HEENT: Normocephalic atraumatic  Eyes: Extraocular movements intact sclera anicteric Neck: supple trachea midline Heart: S1-S2 no rub appreciated Lungs: Clear to auscultation bilaterally normal work of breathing at rest on room air Abdomen: Softly distended/obese habitus nontender Extremities: No edema appreciated no cyanosis or clubbing Skin: No rash on extremities exposed Neuro: Alert and oriented x3 provides a history and follows commands Right upper extremity AV fistula with bruit and thrill  Assessment/Plan:  # CKD stage V with progression to  ESRD - Cr 4.69 on 09/2019 labs with  [facility].  Has an AVF  - HD today and 9/16 - Will contact HD social catheter to clip for outpatient unit  # Metabolic acidosis - Secondary to CKD - Starting dialysis as above  # Chronic diastolic CHF - optimize volume status    # Hypertension - Acceptable control  # Anemia CKD  - no acute indication for ESA   # metabolic bone disease - Check intact PTH and phos   # DM with CKD  - per primary team   Claudia Desanctis 11/01/2019, 10:31 AM

## 2019-11-01 NOTE — Progress Notes (Signed)
Pt placed on his home CPAP for the night.

## 2019-11-02 DIAGNOSIS — N189 Chronic kidney disease, unspecified: Secondary | ICD-10-CM

## 2019-11-02 LAB — BASIC METABOLIC PANEL
Anion gap: 15 (ref 5–15)
BUN: 112 mg/dL — ABNORMAL HIGH (ref 8–23)
CO2: 17 mmol/L — ABNORMAL LOW (ref 22–32)
Calcium: 7.3 mg/dL — ABNORMAL LOW (ref 8.9–10.3)
Chloride: 109 mmol/L (ref 98–111)
Creatinine, Ser: 9.05 mg/dL — ABNORMAL HIGH (ref 0.61–1.24)
GFR calc Af Amer: 6 mL/min — ABNORMAL LOW (ref >60)
GFR calc non Af Amer: 5 mL/min — ABNORMAL LOW (ref >60)
Glucose, Bld: 185 mg/dL — ABNORMAL HIGH (ref 70–99)
Potassium: 4.2 mmol/L (ref 3.5–5.1)
Sodium: 141 mmol/L (ref 135–145)

## 2019-11-02 LAB — CBC
HCT: 30.4 % — ABNORMAL LOW (ref 39.0–52.0)
Hemoglobin: 9.4 g/dL — ABNORMAL LOW (ref 13.0–17.0)
MCH: 31.3 pg (ref 26.0–34.0)
MCHC: 30.9 g/dL (ref 30.0–36.0)
MCV: 101.3 fL — ABNORMAL HIGH (ref 80.0–100.0)
Platelets: 186 10*3/uL (ref 150–400)
RBC: 3 MIL/uL — ABNORMAL LOW (ref 4.22–5.81)
RDW: 15.5 % (ref 11.5–15.5)
WBC: 7.5 10*3/uL (ref 4.0–10.5)
nRBC: 0 % (ref 0.0–0.2)

## 2019-11-02 LAB — GLUCOSE, CAPILLARY: Glucose-Capillary: 276 mg/dL — ABNORMAL HIGH (ref 70–99)

## 2019-11-02 LAB — CBG MONITORING, ED: Glucose-Capillary: 264 mg/dL — ABNORMAL HIGH (ref 70–99)

## 2019-11-02 LAB — HEPATITIS B E ANTIBODY: Hep B E Ab: NEGATIVE

## 2019-11-02 MED ORDER — INSULIN ASPART 100 UNIT/ML ~~LOC~~ SOLN
0.0000 [IU] | Freq: Three times a day (TID) | SUBCUTANEOUS | Status: DC
  Administered 2019-11-03: 3 [IU] via SUBCUTANEOUS
  Administered 2019-11-03: 5 [IU] via SUBCUTANEOUS

## 2019-11-02 MED ORDER — INSULIN ASPART 100 UNIT/ML ~~LOC~~ SOLN
0.0000 [IU] | Freq: Every day | SUBCUTANEOUS | Status: DC
  Administered 2019-11-02: 3 [IU] via SUBCUTANEOUS

## 2019-11-02 NOTE — ED Notes (Signed)
Pt's wife is at bedside. She reports he cannot hear well and she will be the one to really take over his plan of care. Please contact with any new developments in plan especially related to dialysis. She prefers [place] location if possible. They are closer to that area.

## 2019-11-02 NOTE — Progress Notes (Signed)
Renal Navigator has not received seat schedule at this time and will follow up in the morning on 11/03/19.  Alphonzo Cruise, [facility] Renal Navigator [phone]

## 2019-11-02 NOTE — Progress Notes (Signed)
Rounded on patient wife today in correlation to transition to outpatient HD. Ordered consult to dietician and Kidney Failure Book. Patient's wife educated at the bed regarding diet, AV fistula/graft site care, and proper medication administration on HD days.  Patients wifecapable of verbalizing via teach back method but is not pleased with restrictions. Educated on services are available to them through the interdisciplinary team in the clinic setting. Patient's wife with no further questions at this time. Handouts and contact information provided to patient for any further assistance. Will follow as appropriate.   Bradley Sawyer, RN  Dialysis Nurse Coordinator Phone: [phone]

## 2019-11-02 NOTE — ED Notes (Signed)
RN called dialysis- no updates on ETA

## 2019-11-02 NOTE — Progress Notes (Signed)
Home CPAP set up for pt. Able to place himself on without difficultly. Advised pt to notify for RT if any further assistance needed.

## 2019-11-02 NOTE — ED Notes (Signed)
Spoke with nephrology MD and dialysis RN, pt's arm elevated and ice pack applied to hematoma on right upper arm. No active bleeding noted.

## 2019-11-02 NOTE — ED Notes (Signed)
Pt taken to dialysis at this time. Wife meeting with Quintin Alto, renal coordiator

## 2019-11-02 NOTE — Progress Notes (Signed)
[facility] Progress Note  Patient: Bradley Soto    RFF:[id number]  DOA: 10/31/2019     Date of Service: the patient was seen and examined on 11/02/2019  Brief hospital course: Past medical history of HTN, type II DM, CKD stage IV, CAD, OSA, sick sinus syndrome SP pacemaker implant.  Presents with worsening renal function and volume overload.  Found to have progressive CKD currently on dialysis will need outpatient dialysis.  Assessment and Plan: 1.  CKD stage V with progression to ESRD Nephrology was consulted. Patient already has AV fistula First session of hemodialysis without any issue on 11/01/2019. Social worker currently working to keep the patient for outpatient HD.  2.  Metabolic acidosis. Secondary to CKD. Currently on oral bicarb. Now that the patient is undergoing HD nephrology has discontinued it.  3.  OSA Continue CPAP nightly  4.  Chronic diastolic CHF Volume status adequate for now. Monitor and manage with HD.  5.  Essential hypertension Blood pressure currently stable. Monitor while the patient is resting undergoing HD for hypotension.  6.  Type 2 diabetes mellitus, uncontrolled with hyperglycemia and renal dysfunction Continue sliding scale insulin.  And hemoglobin A1c 8.7  7.  Right leg pain. Patient reports severe intermittent electric shocklike pain on the dorsum of the right foot. Pain radiates to his forefoot as well. Patient has good pulses sensations are adequate does not appear to have any neuropathy does not appear to have any trauma or injury. Check ABI though. If no improvement may be a variant of neuropathy and therefore will benefit from being on gabapentin.  8.  Anemia of chronic kidney disease. Management with HD  9.  Obesity Dietary modification recommended. Body mass index is 31.3 kg/m.   Diet: Renal diet DVT Prophylaxis:   heparin injection 5,000 Units Start: 10/31/19 2200    Advance goals of care discussion: Full  code  Family Communication: no family was present at bedside, at the time of interview.   Disposition:  Status is: Inpatient  Remains inpatient appropriate because:Inpatient level of care appropriate due to severity of illness   Dispo: The patient is from: Home              Anticipated d/c is to: Home              Anticipated d/c date is: 2 days              Patient currently is not medically stable to d/c.  Subjective: Continues to have mild shortness of breath but no nausea no vomiting.  Tolerated hemodialysis session yesterday.  Continues to have right leg pain with electric shocklike sensation radiating to his forefoot intermittently.  Currently does not have any pain.  Appears more at nighttime.  Physical Exam:  General: Appear in mild distress, no Rash; Oral Mucosa Clear, moist. no Abnormal Neck Mass Or lumps, Conjunctiva normal  Cardiovascular: S1 and S2 Present, no Murmur, Respiratory: good respiratory effort, Bilateral Air entry present and CTA, no Crackles, no wheezes Abdomen: Bowel Sound present, Soft and pre tenderness Extremities: trace Pedal edema Neurology: alert and oriented to time, place, and person affect appropriate. no new focal deficit Gait not checked due to patient safety concerns  Vitals:   11/01/19 2315 11/02/19 0230 11/02/19 0430 11/02/19 0630  BP:  (!) 123/51 130/60 (!) 119/45  Pulse:  70 71 70  Resp:  16 13 16   Temp:      TempSrc:      SpO2: 97% 97%  96% 94%  Weight:      Height:        Intake/Output Summary (Last 24 hours) at 11/02/2019 0718 Last data filed at 11/01/2019 1648 Gross per 24 hour  Intake --  Output 500 ml  Net -500 ml   Filed Weights   11/01/19 0903 11/01/19 1438  Weight: 103 kg 101.8 kg    Data Reviewed: I have personally reviewed and interpreted daily labs, tele strips, imagings as discussed above. I reviewed all nursing notes, pharmacy notes, vitals, pertinent old records I have discussed plan of care as described  above with RN and patient/family.  CBC: Recent Labs  Lab 10/31/19 1024 11/01/19 0410 11/02/19 0500  WBC 9.5 8.3 7.5  HGB 11.2* 10.2* 9.4*  HCT 35.2* 31.1* 30.4*  MCV 93.6 93.7 101.3*  PLT 219 218 517   Basic Metabolic Panel: Recent Labs  Lab 10/31/19 1024 11/01/19 0410 11/02/19 0500  NA 136 137 141  K 4.6 4.6 4.2  CL 105 106 109  CO2 11* 13* 17*  GLUCOSE 185* 226* 185*  BUN 152* 157* 112*  CREATININE 11.14* 11.27* 9.05*  CALCIUM 7.6* 7.4* 7.3*    Studies: No results found.  Scheduled Meds: . aspirin EC  81 mg Oral Daily  . benzonatate  100 mg Oral BID  . Chlorhexidine Gluconate Cloth  6 each Topical Q0600  . Chlorhexidine Gluconate Cloth  6 each Topical Q0600  . cholecalciferol  2,000 Units Oral Daily  . fluticasone  1 spray Each Nare Daily  . guaiFENesin  600 mg Oral BID  . heparin  5,000 Units Subcutaneous Q12H  . hydrALAZINE  25 mg Oral TID  . insulin aspart  0-15 Units Subcutaneous TID WC  . insulin aspart  0-5 Units Subcutaneous QHS  . metoprolol tartrate  37.5 mg Oral BID  . rosuvastatin  20 mg Oral q1800  . sodium bicarbonate  1,300 mg Oral BID   Continuous Infusions: PRN Meds: ALPRAZolam, ketotifen, ondansetron **OR** ondansetron (ZOFRAN) IV  Time spent: 35 minutes  Author: Berle Mull, MD [facility] 11/02/2019 7:18 AM  To reach On-call, see care teams to locate the attending and reach out via [email or web address]. Between 7PM-7AM, please contact night-coverage If you still have difficulty reaching the attending provider, please page the [facility] (Director on Call) for [facility] on amion for assistance.

## 2019-11-02 NOTE — Progress Notes (Signed)
Renal Navigator met with patient briefly at HD bedside and then with wife/Sarah in [place] waiting area to discuss OP HD referral. Patient's wife requests seat for her husband in [place]. It was explained to Judson Roch that they will have to have their own transportation to the clinic in [place], since their home in [place] is in [place]. She states both she and her husband drive. Navigator added that if patient were to ever need transportation, he would need to transfer to a clinic in [facility] so he could apply for [employer]. She stated understanding and does not foresee this being a need any time in the near future. She states they will take any seat, any time and just want to be discharged from the ED as soon as possible because patient is only here to initiate dialysis and then he can leave. There are no beds available for patient because the hospital is full. She added, "if we have to stay in the hallway, we will leave," but states at this time, he has a nice room in the ED. Navigator told Judson Roch that the referral will be put in immediately and Navigator will request a seat as quickly as possible, but cannot promise that it can be confirmed today. Navigator noted that patient needs to stay in the hospital until seat has been secured in the clinic. Patient's wife inquired about the [facility] clinic, as this one is closer to their home. Navigator had not brought this up as an option because patient follows with [facility] and explained that patient would have to change MD group and be accepted with [facility]. Judson Roch stated she did not want to pursue this. Navigator has submitted referral to [facility] to request treatment for ESRD at [facility]. Navigator also called clinic to discuss the urgent nature of patient's situation since he is here for no other reasons and does not have a bed available to him outside of the ED to see if we can expedite the  referral. Navigator will follow closely. Judson Roch states her cell number is [phone].  Alphonzo Cruise, [place] Renal Navigator [phone]

## 2019-11-02 NOTE — ED Notes (Signed)
PT REMAINS IN DIALYSIS.

## 2019-11-02 NOTE — ED Notes (Signed)
Report to dialysis 

## 2019-11-02 NOTE — Evaluation (Signed)
Physical Therapy Evaluation Patient Details Name: Bradley Soto MRN: [id number] DOB: 07/27/1940 Today's Date: 11/02/2019   History of Present Illness  Patinet is a 79 y.o. male with PMH significant for HTN, DM, CKD stage IV/V, CAD, OSA and history of sick sinus syndrome who presented to the hospital after outpatient labs with markedly elevated BUN and creatinine. He has a Rt arm fistula palced in June 2021 in anticipation of need for hemodyalisis. He has reported general weakness, tired, and DOE.    Clinical Impression  Bradley Soto is 79 y.o. male admitted with above HPI and diagnosis. Patient is currently limited by functional impairments below (see PT problem list). Patient lives with his wife and is independent at baseline. Patient evaluated by Physical Therapy with no further acute PT needs identified. All education has been completed and the patient has no further questions. He is mobilizing at supervision level for functional transfers and gait with no AD. See below for any follow-up Physical Therapy or equipment needs. PT is signing off, please re-consult if there is a change in functional status. Thank you for this referral.     Follow Up Recommendations Outpatient PT (recommend follow up with PCP if weak/unsteady for OPPT)    Equipment Recommendations  None recommended by PT    Recommendations for Other Services       Precautions / Restrictions Precautions Precautions: Fall Precaution Comments: Rt arm fistula Restrictions Weight Bearing Restrictions: No      Mobility  Bed Mobility      General bed mobility comments: Pt sitting up EOB at therapist arrival.   Transfers Overall transfer level: Needs assistance Equipment used: None Transfers: Sit to/from Stand Sit to Stand: Supervision;Min guard         General transfer comment: pt rising with no AD and no assist, using bil UEs for power up from EOB. guarding/supervision for  safety.  Ambulation/Gait Ambulation/Gait assistance: Min guard;Supervision Gait Distance (Feet): 75 Feet Assistive device: None Gait Pattern/deviations: Step-through pattern;Decreased stride length;Wide base of support Gait velocity: fair   General Gait Details: no overt LOB noted during gait. No assist needed to steady with turns. supervision for safety.  Stairs            Wheelchair Mobility    Modified Rankin (Stroke Patients Only)       Balance Overall balance assessment: Mild deficits observed, not formally tested Sitting-balance support: Feet supported Sitting balance-Leahy Scale: Good     Standing balance support: No upper extremity supported;During functional activity Standing balance-Leahy Scale: Good   Single Leg Stance - Right Leg: 0 Single Leg Stance - Left Leg: 0           High Level Balance Comments: pt attempting SLS on bil LE's but reaching for HHA to steady self. Unable to complete on bil LE's.             Pertinent Vitals/Pain Pain Assessment: No/denies pain    Home Living Family/patient expects to be discharged to:: Private residence Living Arrangements: Spouse/significant other Available Help at Discharge: Family Type of Home: House Home Access: Stairs to enter Entrance Stairs-Rails: Right;Left Entrance Stairs-Number of Steps: 1 at front and 4 at garage Home Layout: One level Home Equipment: Walker - standard      Prior Function Level of Independence: Independent         Comments: Independent with ADL's, no need for AD with mobility.     Hand Dominance        Extremity/Trunk Assessment     Upper Extremity Assessment Upper Extremity Assessment: Overall WFL for tasks assessed    Lower Extremity Assessment Lower Extremity Assessment: Overall WFL for tasks assessed    Cervical / Trunk Assessment Cervical / Trunk Assessment: Kyphotic  Communication   Communication: HOH  Cognition Arousal/Alertness:  Awake/alert Behavior During Therapy: WFL for tasks assessed/performed Overall Cognitive Status: Within Functional Limits for tasks assessed          General Comments: pt agreeable to therapy, eager to get home and start HD.      General Comments      Exercises     Assessment/Plan    PT Assessment Patent does not need any further PT services;All further PT needs can be met in the next venue of care  PT Problem List Decreased strength;Decreased activity tolerance;Decreased balance;Decreased mobility       PT Treatment Interventions      PT Goals (Current goals can be found in the Care Plan section)  Acute Rehab PT Goals Patient Stated Goal: get home and back to normal routine, start HD.  PT Goal Formulation: All assessment and education complete, DC therapy Time For Goal Achievement: 11/16/19 Potential to Achieve Goals: Good    Frequency  1x Eval/treat    AM-PAC PT "6 Clicks" Mobility  Outcome Measure Help needed turning from your back to your side while in a flat bed without using bedrails?: None Help needed moving from lying on your back to sitting on the side of a flat bed without using bedrails?: None Help needed moving to and from a bed to a chair (including a wheelchair)?: A Little Help needed standing up from a chair using your arms (e.g., wheelchair or bedside chair)?: A Little Help needed to walk in hospital room?: A Little Help needed climbing 3-5 steps with a railing? : A Little 6 Click Score: 20    End of Session Equipment Utilized During Treatment: Gait belt Activity Tolerance: Patient tolerated treatment well Patient left: in bed;with call bell/phone within reach;with family/visitor present Nurse Communication: Mobility status PT Visit Diagnosis: Difficulty in walking, not elsewhere classified (R26.2);Unsteadiness on feet (R26.81)    Time: 1040-1057 PT Time Calculation (min) (ACUTE ONLY): 17 min   Charges:   PT Evaluation $PT Eval Low Complexity:  1 Low          Verner Mould, DPT Acute Rehabilitation Services  Office [phone] Pager [phone]  11/02/2019 11:50 AM

## 2019-11-02 NOTE — ED Notes (Signed)
Report received from [place], [place]. Pt remains in dialysis at this time.

## 2019-11-02 NOTE — ED Notes (Signed)
Pt remains in dialysis

## 2019-11-02 NOTE — Progress Notes (Signed)
[facility] Progress Note  Name: Bradley Soto MRN: [id number] DOB: [birth year]   Subjective:  Has still been in the ER.  Pt had 700 mL UOP over 9/15.  He hasn't had HD yet today but had first tx yesterday and states it went ok.  AVF cannulated without difficulty.   Review of systems:  Denies n/v Denies shortness of breath or chest pain   ---------------- Background:  Bradley Soto is a 79 y.o. male 3 of hypertension, diabetes, CKD stage IV/V, coronary artery disease, OSA and history of sick sinus syndrome who presented to the hospital after outpatient labs.  The patient states that he had seen his PCP because they thought that he had some type of infection.  He was told to come to the ER after getting outpatient labs due to his blood work with markedly elevated BUN and creatinine.  He states that he has been eating and drinking okay.  He denies any confusion.  He denies any difficulty urinating.  He states that he is somewhat short of breath with exertion but not at rest.  No chest pain.  He was told that his right arm fistula would be ready to use when needed.  We have discussed the indications for dialysis and he consents to proceed with dialysis.  He lives near [place] and will share this information with the HD social worker  Intake/Output Summary (Last 24 hours) at 11/02/2019 1306 Last data filed at 11/01/2019 1648 Gross per 24 hour  Intake --  Output 0 ml  Net 0 ml    Vitals:  Vitals:   11/02/19 0430 11/02/19 0630 11/02/19 1000 11/02/19 1130  BP: 130/60 (!) 119/45 (!) 131/56 (!) 118/56  Pulse: 71 70 77 78  Resp: 13 16 13 15   Temp:      TempSrc:      SpO2: 96% 94% 98% 99%  Weight:      Height:         Physical Exam:  General adult male in bed in no acute distress HEENT normocephalic atraumatic extraocular movements intact sclera anicteric Neck supple trachea midline Lungs clear to auscultation bilaterally normal work of breathing at rest  Heart regular  rate and rhythm no rubs or gallops appreciated Abdomen soft nontender nondistended Extremities no pitting edema  Psych normal mood and affect Neuro - alert and oriented x 3 provides hx and follows commands Access: RUE AVF bruit and thrill    Medications reviewed   Labs:  BMP Latest Ref Rng & Units 11/02/2019 11/01/2019 10/31/2019  Glucose 70 - 99 mg/dL 185(H) 226(H) 185(H)  BUN 8 - 23 mg/dL 112(H) 157(H) 152(H)  Creatinine 0.61 - 1.24 mg/dL 9.05(H) 11.27(H) 11.14(H)  BUN/Creat Ratio 10 - 24 - - -  Sodium 135 - 145 mmol/L 141 137 136  Potassium 3.5 - 5.1 mmol/L 4.2 4.6 4.6  Chloride 98 - 111 mmol/L 109 106 105  CO2 22 - 32 mmol/L 17(L) 13(L) 11(L)  Calcium 8.9 - 10.3 mg/dL 7.3(L) 7.4(L) 7.6(L)     Assessment/Plan:    # CKD stage V with progression to ESRD - Note also had Cr 4.69 on 09/2019 labs with [facility].  Has an AVF  - HD on 9/15 and 9/16.  Then anticipate HD on 9/18  - Have contacted HD social catheter to clip for outpatient unit  # Metabolic acidosis - Secondary to CKD - stop bicarb - on HD now as above   # Chronic diastolic CHF - optimize volume status with  HD   # Hypertension - Acceptable control  # Anemia CKD  - no acute indication for ESA.  Check iron stores.    # metabolic bone disease - Check intact PTH and phos   # DM with CKD  - per primary team   Claudia Desanctis, MD 11/02/2019 1:06 PM

## 2019-11-02 NOTE — ED Notes (Signed)
Report called to [place], Therapist, sports. Pt to be admitted to [facility].

## 2019-11-02 NOTE — ED Notes (Signed)
Pt returned from dialysis, c/o pain and swelling in right arm at fistula site. Right upper arm is swollen, has bruising and is tender to touch. Nephrology paged.

## 2019-11-02 NOTE — Progress Notes (Signed)
Inpatient Diabetes Program Recommendations  AACE/ADA: New Consensus Statement on Inpatient Glycemic Control (2015)  Target Ranges:  Prepandial:   less than 140 mg/dL      Peak postprandial:   less than 180 mg/dL (1-2 hours)      Critically ill patients:  140 - 180 mg/dL   Results for [name], [name] (MRN [id number]) as of 11/02/2019 09:04  Ref. Range 11/01/2019 09:05 11/01/2019 12:48 11/01/2019 20:11 11/01/2019 22:44  Glucose-Capillary Latest Ref Range: 70 - 99 mg/dL 204 (H)  3 units NOVOLOG  193 (H)  2 units NOVOLOG  333 (H)  7 units NOVOLOG  304 (H)    Admit with: CKD stage V with progression to ESRD  History: DM, CKD5, MI  Home DM Meds: 70/30 Insulin 35 units BID  Current Orders: Novolog Moderate Correction Scale/ SSI (0-15 units) TID AC + HS    MD- Note patient takes 70/30 Insulin at home.    Likely needs some basal insulin back to inpatient diabetes regimen.  Not sure how patient eating in hospital so it may be best to add some Levemir instead of the 70/30 Insulin and then can resume the 70/30 Insulin when patient is discharged home.  Please consider adding Levemir 15 units Daily (0.15 units/kg)    --Will follow patient during hospitalization--  Wyn Quaker RN, MSN, CDE Diabetes Coordinator Inpatient Glycemic Control Team Team Pager: [phone] (8a-5p)

## 2019-11-03 ENCOUNTER — Inpatient Hospital Stay (HOSPITAL_COMMUNITY): Payer: Medicare Other

## 2019-11-03 DIAGNOSIS — N186 End stage renal disease: Secondary | ICD-10-CM

## 2019-11-03 DIAGNOSIS — R52 Pain, unspecified: Secondary | ICD-10-CM

## 2019-11-03 LAB — IRON AND TIBC
Iron: 53 ug/dL (ref 45–182)
Saturation Ratios: 29 % (ref 17.9–39.5)
TIBC: 181 ug/dL — ABNORMAL LOW (ref 250–450)
UIBC: 128 ug/dL

## 2019-11-03 LAB — CBC
HCT: 28.3 % — ABNORMAL LOW (ref 39.0–52.0)
Hemoglobin: 9.1 g/dL — ABNORMAL LOW (ref 13.0–17.0)
MCH: 30 pg (ref 26.0–34.0)
MCHC: 32.2 g/dL (ref 30.0–36.0)
MCV: 93.4 fL (ref 80.0–100.0)
Platelets: 166 10*3/uL (ref 150–400)
RBC: 3.03 MIL/uL — ABNORMAL LOW (ref 4.22–5.81)
RDW: 15 % (ref 11.5–15.5)
WBC: 7.4 10*3/uL (ref 4.0–10.5)
nRBC: 0 % (ref 0.0–0.2)

## 2019-11-03 LAB — GLUCOSE, CAPILLARY
Glucose-Capillary: 183 mg/dL — ABNORMAL HIGH (ref 70–99)
Glucose-Capillary: 232 mg/dL — ABNORMAL HIGH (ref 70–99)

## 2019-11-03 LAB — FERRITIN: Ferritin: 394 ng/mL — ABNORMAL HIGH (ref 24–336)

## 2019-11-03 LAB — BASIC METABOLIC PANEL
Anion gap: 9 (ref 5–15)
BUN: 75 mg/dL — ABNORMAL HIGH (ref 8–23)
CO2: 22 mmol/L (ref 22–32)
Calcium: 7.1 mg/dL — ABNORMAL LOW (ref 8.9–10.3)
Chloride: 108 mmol/L (ref 98–111)
Creatinine, Ser: 6.51 mg/dL — ABNORMAL HIGH (ref 0.61–1.24)
GFR calc Af Amer: 9 mL/min — ABNORMAL LOW (ref >60)
GFR calc non Af Amer: 7 mL/min — ABNORMAL LOW (ref >60)
Glucose, Bld: 196 mg/dL — ABNORMAL HIGH (ref 70–99)
Potassium: 3.9 mmol/L (ref 3.5–5.1)
Sodium: 139 mmol/L (ref 135–145)

## 2019-11-03 LAB — PHOSPHORUS: Phosphorus: 6.9 mg/dL — ABNORMAL HIGH (ref 2.5–4.6)

## 2019-11-03 MED ORDER — SEVELAMER CARBONATE 800 MG PO TABS: 800.0000 mg | ORAL_TABLET | Freq: Three times a day (TID) | ORAL | 3 refills | Status: DC

## 2019-11-03 MED ORDER — KIDNEY FAILURE BOOK
Freq: Once | Status: DC
  Filled 2019-11-03: qty 1

## 2019-11-03 MED ORDER — KIDNEY FAILURE BOOK: 1.0000 | Freq: Once | Status: AC

## 2019-11-03 MED ORDER — HYDRALAZINE HCL 25 MG PO TABS
25.0000 mg | ORAL_TABLET | Freq: Three times a day (TID) | ORAL | 3 refills | Status: DC
Start: 2019-11-03 — End: 2021-10-08

## 2019-11-03 MED ORDER — ONDANSETRON HCL 4 MG PO TABS: 4.0000 mg | ORAL_TABLET | Freq: Four times a day (QID) | ORAL | 1 refills | Status: DC | PRN

## 2019-11-03 MED ORDER — SEVELAMER CARBONATE 800 MG PO TABS: 800.0000 mg | ORAL_TABLET | Freq: Three times a day (TID) | ORAL | Status: DC

## 2019-11-03 MED ORDER — ALLOPURINOL 100 MG PO TABS
100.0000 mg | ORAL_TABLET | Freq: Every day | ORAL | 3 refills | Status: DC
Start: 2019-11-03 — End: 2022-08-24

## 2019-11-03 NOTE — Discharge Summary (Signed)
Physician Discharge Summary  Bradley Soto KGY:[id number] DOB: 1940-10-21 DOA: 10/31/2019  PCP: [name], MD  Admit date: 10/31/2019 Discharge date: 11/03/2019  Admitted From: Home  Disposition:  Home   Recommendations for Outpatient Follow-up:  1. Follow up with Dialysis center today for completing forms and tomorrow for first outpatient dialysis session 2. Follow up with PCP in 1-2 weeks      Home Health: None  Equipment/Devices: None  Discharge Condition: Fair  CODE STATUS: FULL Diet recommendation: Renal, diabetic  Brief/Interim Summary: Bradley Soto is an 79 y.o. M with hx HTN, DM, CAD, SSS with PPM, and CKD stage IV who presented with swelling, malaise, found to have progression of renal disease.  Nephrology were consulted and initiation of dialysis was recommended.     PRINCIPAL HOSPITAL DIAGNOSIS: Chronic kidney disease stage V progression to ESRD    Discharge Diagnoses:  Chronic kidney disease stage V progression to ESRD Metabolic acidosis Patient was admitted and nephrology were consulted.  Dialysis was initiated.  He had an infiltration of his fistula, but no hematoma and his Hgb remained stable.  He had nausea the day of discharge, which resolved with Zofran and was within the range of expected during dialysis initiation.    The renal navigator arranged dialysis at a local center for him, and he was discharged to complete paperwork tonight for outpatient session tomorrow.   Chronic diastolic CHF Diabetes Hypertension Sleep apnea  Leg pain Patient complained of right foot pain.  ABI obtained showed noncompressible vessels but toe brachial indices normal.  Pain resolved without treatment.  Anemia of CKD  Obesity  BMI 31.3            Discharge Instructions  Discharge Instructions    Diet - low sodium heart healthy   Complete by: As directed    Discharge instructions   Complete by: As directed    From Dr. Loleta Books: You were  admitted for initiation of dialysis. This is commonly how progression of kidney disease looks, at the time that people start dialysis.  Follow up with your kidney clinic this afternoon before 4pm.  Go to Dialysis tomorrow by 12:15pm   Resume your normal home medicines with a few exceptions: You may stop the Lasix (no longer needed on dialysis, unless your kidney clinic recommends it) Reduce your dose of allopurinol to 100 mg once daily and ask your primary care doctor about this Increase your hydralazine dose to 25 mg three times per day Adhere to the diet and fluid restrictions you were given  Start the new medicine Renvela to help manage phosphates in the blood   Increase activity slowly   Complete by: As directed      Allergies as of 11/03/2019      Reactions   Shellfish Allergy Rash      Medication List    STOP taking these medications   furosemide 40 MG tablet Commonly known as: LASIX   oxyCODONE-acetaminophen 5-325 MG tablet Commonly known as: Percocet     TAKE these medications   ALLERGY EYE DROPS OP Place 1 drop into both eyes daily as needed (allergies).   allopurinol 100 MG tablet Commonly known as: ZYLOPRIM Take 1 tablet (100 mg total) by mouth daily. What changed:   how much to take  when to take this   ALPRAZolam 0.25 MG tablet Commonly known as: XANAX Take 0.25 mg by mouth at bedtime as needed for sleep.   aspirin EC 81 MG tablet Take 81 mg by  mouth daily.   CITRACAL SLOW RELEASE PO Take 1 tablet by mouth daily.   diphenhydramine-acetaminophen 25-500 MG Tabs tablet Commonly known as: TYLENOL PM Take 0.5 tablets by mouth at bedtime.   hydrALAZINE 25 MG tablet Commonly known as: APRESOLINE Take 1 tablet (25 mg total) by mouth 3 (three) times daily. What changed: when to take this   metoprolol tartrate 25 MG tablet Commonly known as: LOPRESSOR Take 1.5 tablets (37.5 mg total) by mouth 2 (two) times daily.   NovoLOG Mix 70/30 FlexPen  (70-30) 100 UNIT/ML FlexPen Generic drug: insulin aspart protamine - aspart Inject 35 Units into the skin 2 (two) times daily with a meal.   ondansetron 4 MG tablet Commonly known as: ZOFRAN Take 1 tablet (4 mg total) by mouth every 6 (six) hours as needed for nausea.   rosuvastatin 20 MG tablet Commonly known as: CRESTOR TAKE 1 TABLET (20 MG TOTAL) BY MOUTH DAILY AT 6 PM.   sevelamer carbonate 800 MG tablet Commonly known as: RENVELA Take 1 tablet (800 mg total) by mouth 3 (three) times daily with meals.   Vitamin D-3 25 MCG (1000 UT) Caps Take 2,000 Units by mouth daily.     ASK your doctor about these medications   kidney failure book Misc 1 each by Does not apply route once for 1 dose. Ask about: Should I take this medication?       Allergies  Allergen Reactions  . Shellfish Allergy Rash    Consultations:  Nephrology   Procedures/Studies: US RENAL  Result Date: 10/31/2019 CLINICAL DATA:  Acute renal failure EXAM: RENAL / URINARY TRACT ULTRASOUND COMPLETE COMPARISON:  None. FINDINGS: Right Kidney: Renal measurements: 9.7 x 6.0 x 7.0 = volume: 212.5 mL. Echogenicity is increased. There is renal cortical thinning. No mass, perinephric fluid, or hydronephrosis visualized. No sonographically demonstrable calculus or ureterectasis. Left Kidney: Renal measurements: 10.1 x 5.3 x 6.3 cm = volume: 173.7 mL. Echogenicity is increased. There is renal cortical thinning. No mass, perinephric fluid, or hydronephrosis visualized. No sonographically demonstrable calculus or ureterectasis. Bladder: Appears normal for degree of bladder distention. Other: None. IMPRESSION: Renal cortical thinning and increased echogenicity bilaterally, findings may be associated with medical renal disease. No obstructing focus in either kidney. Study otherwise unremarkable. Electronically Signed   By: Lowella Grip III M.D.   On: 10/31/2019 19:27   DG Chest Portable 1 View  Result Date:  10/31/2019 CLINICAL DATA:  Cough and fever EXAM: PORTABLE CHEST 1 VIEW COMPARISON:  January 06, 2019 FINDINGS: The heart size and mediastinal contours are within normal limits. Hazy patchy airspace opacity is seen at the left lung base. The right lung is clear. A left-sided pacemaker seen with the lead tips in the right atrium right ventricle. No acute osseous abnormality. IMPRESSION: Hazy airspace opacity at the left lung base which could be due to atelectasis and/or infectious etiology. Electronically Signed   By: Prudencio Pair M.D.   On: 10/31/2019 18:09   VAS [place] ABI WITH/WO TBI  Result Date: 11/03/2019 LOWER EXTREMITY DOPPLER STUDY Indications: Rest pain. High Risk Factors: Hypertension, hyperlipidemia, Diabetes.  Comparison Study: No prior studies. Performing Technologist: Carlos Levering RVT  Examination Guidelines: A complete evaluation includes at minimum, Doppler waveform signals and systolic blood pressure reading at the level of bilateral brachial, anterior tibial, and posterior tibial arteries, when vessel segments are accessible. Bilateral testing is considered an integral part of a complete examination. Photoelectric Plethysmograph (PPG) waveforms and toe systolic pressure readings are included as  required and additional duplex testing as needed. Limited examinations for reoccurring indications may be performed as noted.  ABI Findings: +---------+------------------+-----+---------+---------+ Right    Rt Pressure (mmHg)IndexWaveform Comment   +---------+------------------+-----+---------+---------+ Brachial                                 HD Access +---------+------------------+-----+---------+---------+ PTA      254               1.64 triphasic          +---------+------------------+-----+---------+---------+ DP       254               1.64 triphasic          +---------+------------------+-----+---------+---------+ Great Toe119               0.77                     +---------+------------------+-----+---------+---------+ +---------+------------------+-----+---------+-------+ Left     Lt Pressure (mmHg)IndexWaveform Comment +---------+------------------+-----+---------+-------+ Brachial 155                    triphasic        +---------+------------------+-----+---------+-------+ PTA      254               1.64 triphasic        +---------+------------------+-----+---------+-------+ DP       254               1.64 triphasic        +---------+------------------+-----+---------+-------+ Great Toe121               0.78                  +---------+------------------+-----+---------+-------+ +-------+-----------+-----------+------------+------------+ ABI/TBIToday's ABIToday's TBIPrevious ABIPrevious TBI +-------+-----------+-----------+------------+------------+ Right  [place]         0.77                                +-------+-----------+-----------+------------+------------+ Left   [place]         0.78                                +-------+-----------+-----------+------------+------------+  Summary: Right: Resting right ankle-brachial index indicates noncompressible right lower extremity arteries. The right toe-brachial index is normal. Left: Resting left ankle-brachial index indicates noncompressible left lower extremity arteries. The left toe-brachial index is normal.  *See table(s) above for measurements and observations.  Electronically signed by Servando Snare MD on 11/03/2019 at 3:41:14 PM.    Final       Subjective: Nausea this morning, subsided with ondansetron.  No confusion, fever.  The right arm is sore and slightly swollen.  Discharge Exam: Vitals:   11/03/19 0045 11/03/19 0444  BP: (!) 124/56 133/85  Pulse: 65 69  Resp: 16 17  Temp: 98 F (36.7 C) (!) 97.5 F (36.4 C)  SpO2: 93% 96%   Vitals:   11/02/19 1930 11/02/19 2035 11/03/19 0045 11/03/19 0444  BP: (!) 135/59 (!) 157/64 (!) 124/56 133/85  Pulse: 92 96  65 69  Resp: 17 17 16 17   Temp:  98.6 F (37 C) 98 F (36.7 C) (!) 97.5 F (36.4 C)  TempSrc:  Oral Oral Oral  SpO2: 96% 96% 93% 96%  Weight:  99.8 kg  Height:        General: Pt is alert, awake, not in acute distress, ambulating to the bathroom without assistance Cardiovascular: RRR, nl S1-S2, no murmurs appreciated.   No LE edema.  The right arm has a mild infiltrate, no mass, good thrill. Respiratory: Normal respiratory rate and rhythm.  CTAB without rales or wheezes. Abdominal: Abdomen soft and non-tender.  No distension or HSM.   Neuro/Psych: Strength symmetric in upper and lower extremities.  Judgment and insight appear normal.   The results of significant diagnostics from this hospitalization (including imaging, microbiology, ancillary and laboratory) are listed below for reference.     Microbiology: Recent Results (from the past 240 hour(s))  Blood culture (routine x 2)     Status: None (Preliminary result)   Collection Time: 10/31/19  6:00 PM   Specimen: BLOOD LEFT HAND  Result Value Ref Range Status   Specimen Description BLOOD LEFT HAND  Final   Special Requests   Final    BOTTLES DRAWN AEROBIC AND ANAEROBIC Blood Culture results may not be optimal due to an inadequate volume of blood received in culture bottles   Culture   Final    NO GROWTH 3 DAYS Performed at [facility], [place] [address] [place], [place] [address]    Report Status PENDING  Incomplete  SARS Coronavirus 2 by RT PCR (hospital order, performed in [facility] hospital lab) Nasopharyngeal Nasopharyngeal Swab     Status: None   Collection Time: 10/31/19  6:04 PM   Specimen: Nasopharyngeal Swab  Result Value Ref Range Status   SARS Coronavirus 2 NEGATIVE NEGATIVE Final    Comment: (NOTE) SARS-CoV-2 target nucleic acids are NOT DETECTED.  The SARS-CoV-2 RNA is generally detectable in upper and lower respiratory specimens during the acute phase of infection. The lowest concentration of  SARS-CoV-2 viral copies this assay can detect is 250 copies / mL. A negative result does not preclude SARS-CoV-2 infection and should not be used as the sole basis for treatment or other patient management decisions.  A negative result may occur with improper specimen collection / handling, submission of specimen other than nasopharyngeal swab, presence of viral mutation(s) within the areas targeted by this assay, and inadequate number of viral copies (<250 copies / mL). A negative result must be combined with clinical observations, patient history, and epidemiological information.  Fact Sheet for Patients:   StrictlyIdeas.no  Fact Sheet for Healthcare Providers: BankingDealers.co.za  This test is not yet approved or  cleared by the [place] FDA and has been authorized for detection and/or diagnosis of SARS-CoV-2 by FDA under an Emergency Use Authorization (EUA).  This EUA will remain in effect (meaning this test can be used) for the duration of the COVID-19 declaration under Section 564(b)(1) of the Act, 21 U.S.C. section 360bbb-3(b)(1), unless the authorization is terminated or revoked sooner.  Performed at [facility], [place] [address] [place], [place] [address]   Blood culture (routine x 2)     Status: None (Preliminary result)   Collection Time: 10/31/19  6:50 PM   Specimen: BLOOD RIGHT ARM  Result Value Ref Range Status   Specimen Description BLOOD RIGHT ARM  Final   Special Requests   Final    BOTTLES DRAWN AEROBIC AND ANAEROBIC Blood Culture adequate volume   Culture   Final    NO GROWTH 3 DAYS Performed at [facility], [place] [address], [place], [place] [address]    Report Status PENDING  Incomplete     Labs: BNP (last 3 results) No results for input(s): BNP in the last 8760 hours. Basic Metabolic Panel: Recent Labs  Lab 10/31/19 1024 11/01/19 0410 11/02/19 0500 11/03/19 0533  NA 136 137 141 139   K 4.6 4.6 4.2 3.9  CL 105 106 109 108  CO2 11* 13* 17* 22  GLUCOSE 185* 226* 185* 196*  BUN 152* 157* 112* 75*  CREATININE 11.14* 11.27* 9.05* 6.51*  CALCIUM 7.6* 7.4* 7.3* 7.1*  PHOS  --   --   --  6.9*   Liver Function Tests: No results for input(s): AST, ALT, ALKPHOS, BILITOT, PROT, ALBUMIN in the last 168 hours. No results for input(s): LIPASE, AMYLASE in the last 168 hours. No results for input(s): AMMONIA in the last 168 hours. CBC: Recent Labs  Lab 10/31/19 1024 11/01/19 0410 11/02/19 0500 11/03/19 0533  WBC 9.5 8.3 7.5 7.4  HGB 11.2* 10.2* 9.4* 9.1*  HCT 35.2* 31.1* 30.4* 28.3*  MCV 93.6 93.7 101.3* 93.4  PLT 219 218 186 166   Cardiac Enzymes: No results for input(s): CKTOTAL, CKMB, CKMBINDEX, TROPONINI in the last 168 hours. BNP: Invalid input(s): POCBNP CBG: Recent Labs  Lab 11/01/19 2244 11/02/19 1126 11/02/19 2039 11/03/19 0710 11/03/19 1108  GLUCAP 304* 264* 276* 183* 232*   D-Dimer No results for input(s): DDIMER in the last 72 hours. Hgb A1c No results for input(s): HGBA1C in the last 72 hours. Lipid Profile No results for input(s): CHOL, HDL, LDLCALC, TRIG, CHOLHDL, LDLDIRECT in the last 72 hours. Thyroid function studies No results for input(s): TSH, T4TOTAL, T3FREE, THYROIDAB in the last 72 hours.  Invalid input(s): FREET3 Anemia work up Recent Labs    11/03/19 0533  FERRITIN 394*  TIBC 181*  IRON 53   Urinalysis    Component Value Date/Time   COLORURINE YELLOW 12/05/2018 [place] 12/05/2018 0252   LABSPEC 1.010 12/05/2018 0252   PHURINE 5.0 12/05/2018 0252   GLUCOSEU NEGATIVE 12/05/2018 0252   HGBUR NEGATIVE 12/05/2018 0252   BILIRUBINUR NEGATIVE 12/05/2018 0252   KETONESUR NEGATIVE 12/05/2018 0252   PROTEINUR 100 (A) 12/05/2018 0252   NITRITE NEGATIVE 12/05/2018 0252   LEUKOCYTESUR NEGATIVE 12/05/2018 0252   Sepsis Labs Invalid input(s): PROCALCITONIN,  WBC,  LACTICIDVEN Microbiology Recent Results  (from the past 240 hour(s))  Blood culture (routine x 2)     Status: None (Preliminary result)   Collection Time: 10/31/19  6:00 PM   Specimen: BLOOD LEFT HAND  Result Value Ref Range Status   Specimen Description BLOOD LEFT HAND  Final   Special Requests   Final    BOTTLES DRAWN AEROBIC AND ANAEROBIC Blood Culture results may not be optimal due to an inadequate volume of blood received in culture bottles   Culture   Final    NO GROWTH 3 DAYS Performed at [facility], [place] [address] [place], [place] [address]    Report Status PENDING  Incomplete  SARS Coronavirus 2 by RT PCR (hospital order, performed in [facility] hospital lab) Nasopharyngeal Nasopharyngeal Swab     Status: None   Collection Time: 10/31/19  6:04 PM   Specimen: Nasopharyngeal Swab  Result Value Ref Range Status   SARS Coronavirus 2 NEGATIVE NEGATIVE Final    Comment: (NOTE) SARS-CoV-2 target nucleic acids are NOT DETECTED.  The SARS-CoV-2 RNA is generally detectable in upper and lower respiratory specimens during the acute phase of infection. The lowest concentration of SARS-CoV-2 viral copies this  assay can detect is 250 copies / mL. A negative result does not preclude SARS-CoV-2 infection and should not be used as the sole basis for treatment or other patient management decisions.  A negative result may occur with improper specimen collection / handling, submission of specimen other than nasopharyngeal swab, presence of viral mutation(s) within the areas targeted by this assay, and inadequate number of viral copies (<250 copies / mL). A negative result must be combined with clinical observations, patient history, and epidemiological information.  Fact Sheet for Patients:   StrictlyIdeas.no  Fact Sheet for Healthcare Providers: BankingDealers.co.za  This test is not yet approved or  cleared by the [place] FDA and has been authorized for detection  and/or diagnosis of SARS-CoV-2 by FDA under an Emergency Use Authorization (EUA).  This EUA will remain in effect (meaning this test can be used) for the duration of the COVID-19 declaration under Section 564(b)(1) of the Act, 21 U.S.C. section 360bbb-3(b)(1), unless the authorization is terminated or revoked sooner.  Performed at [facility], [place] [address], [place], [place] [address]   Blood culture (routine x 2)     Status: None (Preliminary result)   Collection Time: 10/31/19  6:50 PM   Specimen: BLOOD RIGHT ARM  Result Value Ref Range Status   Specimen Description BLOOD RIGHT ARM  Final   Special Requests   Final    BOTTLES DRAWN AEROBIC AND ANAEROBIC Blood Culture adequate volume   Culture   Final    NO GROWTH 3 DAYS Performed at [facility], [place] [address] [place], [place] [address]    Report Status PENDING  Incomplete     Time coordinating discharge: 35 minutes The [place] controlled substances registry was reviewed for this patient     SIGNED:   [name], MD  [facility] 11/03/2019, 7:06 PM

## 2019-11-03 NOTE — Progress Notes (Signed)
Patient has been accepted at [facility] on a TTS schedule with a seat time of 12:30pm. He needs to arrive to his appointments at 12:15pm.  If patient will be cleared for discharge today, he needs to report to the clinic after discharge, before 4pm today in order to sign paperwork, in order to start in the clinic tomorrow, Saturday, 11/04/19.  Renal Navigator notified Dr. Bartholomew Boards who plans to evaluate patient and then update Navigator.  Renal Navigator to meet with patient and wife to inform of clinic seat schedule and will continue to follow for discharge planning.  Alphonzo Cruise, [place] Renal Navigator [phone]

## 2019-11-03 NOTE — Plan of Care (Signed)
Nutrition Education Note  RD consulted for Renal Education. Completed pt's dietary recall. Pt typically eats 3 meals per day, but does not always include protein at breakfast. Pt had already been provided with educational material on specific foods. Explained why diet restrictions are needed and discussed foods to limit/avoid that are high in potassium, sodium, and phosphorus. Provided specific recommendations on safer alternatives of these foods. Strongly encouraged compliance of this diet.  Discussed importance of protein intake at each meal and snack. Provided examples of how to maximize protein intake throughout the day. Discussed need for fluid restriction with dialysis, importance of minimizing weight gain between HD treatments, and renal-friendly beverage options.  Encouraged pt to discuss specific diet questions/concerns with RD at HD outpatient facility. Teach back method used.  Expect great compliance.  Body mass index is 30.68 kg/m. Pt meets criteria for obesity based on current BMI.  Current diet order is Renal, patient is consuming approximately 100% of meals at this time. Labs and medications reviewed. No further nutrition interventions warranted at this time. RD contact information provided. If additional nutrition issues arise, please re-consult RD.  Larkin Ina, MS, RD, LDN RD pager number and weekend/on-call pager number located in [place].

## 2019-11-03 NOTE — Progress Notes (Signed)
DISCHARGE NOTE HOME Bradley Soto to be discharged Home per MD order. Discussed prescriptions and follow up appointments with the patient. Prescriptions given to patient; medication list explained in detail. Patient verbalized understanding.  Skin clean, dry and intact without evidence of skin break down, no evidence of skin tears noted. IV catheter discontinued intact. Site without signs and symptoms of complications. Dressing and pressure applied. Pt denies pain at the site currently. No complaints noted.  Patient free of lines, drains, and wounds.   An After Visit Summary (AVS) was printed and given to the patient. Patient escorted via wheelchair, and discharged home via private auto.  Orville Govern, RN

## 2019-11-03 NOTE — Progress Notes (Signed)
[facility] Progress Note  Name: Bradley Soto MRN: [id number] DOB: [birth year]   Subjective:  Was accepted TTS to [facility] kidney center.  He had 600 mL UOP over 9/16.  Had HD on 9/16 with infiltration.  Kept even.  Spoke with his wife at bedside - they are eager to go today and states she was told needs to be at [employer] in a few hours to sign papers.  [name] tired but getting acclimated to HD.   Review of systems:   Denies n/v Denies shortness of breath or chest pain   ---------------- Background:  Bradley Soto is a 79 y.o. male 3 of hypertension, diabetes, CKD stage IV/V, coronary artery disease, OSA and history of sick sinus syndrome who presented to the hospital after outpatient labs.  The patient states that he had seen his PCP because they thought that he had some type of infection.  He was told to come to the ER after getting outpatient labs due to his blood work with markedly elevated BUN and creatinine.  He states that he has been eating and drinking okay.  He denies any confusion.  He denies any difficulty urinating.  He states that he is somewhat short of breath with exertion but not at rest.  No chest pain.  He was told that his right arm fistula would be ready to use when needed.  We have discussed the indications for dialysis and he consents to proceed with dialysis.  He lives near [place] and will share this information with the HD social worker  Intake/Output Summary (Last 24 hours) at 11/03/2019 1241 Last data filed at 11/03/2019 0900 Gross per 24 hour  Intake 320 ml  Output 1000 ml  Net -680 ml    Vitals:  Vitals:   11/02/19 1930 11/02/19 2035 11/03/19 0045 11/03/19 0444  BP: (!) 135/59 (!) 157/64 (!) 124/56 133/85  Pulse: 92 96 65 69  Resp: 17 17 16 17   Temp:  98.6 F (37 C) 98 F (36.7 C) (!) 97.5 F (36.4 C)  TempSrc:  Oral Oral Oral  SpO2: 96% 96% 93% 96%  Weight:  99.8 kg    Height:         Physical Exam:  General adult male in bed  in no acute distress HEENT normocephalic atraumatic extraocular movements intact sclera anicteric Neck supple trachea midline Lungs clear to auscultation bilaterally normal work of breathing at rest  Heart regular rate and rhythm no rubs or gallops appreciated Abdomen soft nontender nondistended Extremities no pitting edema  Psych normal mood and affect Neuro - alert and oriented x 3 provides hx and follows commands Access: RUE AVF bruit and thrill; bruising; bandaged.     Medications reviewed   Labs:  BMP Latest Ref Rng & Units 11/03/2019 11/02/2019 11/01/2019  Glucose 70 - 99 mg/dL 196(H) 185(H) 226(H)  BUN 8 - 23 mg/dL 75(H) 112(H) 157(H)  Creatinine 0.61 - 1.24 mg/dL 6.51(H) 9.05(H) 11.27(H)  BUN/Creat Ratio 10 - 24 - - -  Sodium 135 - 145 mmol/L 139 141 137  Potassium 3.5 - 5.1 mmol/L 3.9 4.2 4.6  Chloride 98 - 111 mmol/L 108 109 106  CO2 22 - 32 mmol/L 22 17(L) 13(L)  Calcium 8.9 - 10.3 mg/dL 7.1(L) 7.3(L) 7.4(L)     Assessment/Plan:    # CKD stage V with progression to ESRD - Note also had Cr 4.69 on 09/2019 labs with [facility].  Has an AVF.  S/p HD on 9/15 and 9/16.   -  Continue HD on 9/18 TTS schedule   - Has a spot TTS [employer]  - asked that he elevate and ice his arm   # Metabolic acidosis - Secondary to CKD - stopped bicarb - on HD now as above   # Chronic diastolic CHF - optimize volume status with HD   # Hypertension - Acceptable control  # Anemia CKD  - Would start [place] outpatient.  Will need outpatient IV iron at HD, as well.     # metabolic bone disease - PTH pending and will be checked outpt HD with meds titrated per protocol.  Hyperphos. Start renvela  # DM with CKD  - per primary team   Stable for discharge from a nephrology standpoint.  Spoke with primary team   Claudia Desanctis, MD 11/03/2019 1:06 PM

## 2019-11-03 NOTE — Care Management Important Message (Signed)
Important Message  Patient Details  Name: Bradley Soto MRN: [id number] Date of Birth: [birth year]   Medicare Important Message Given:  Yes - Important Message mailed due to current National Emergency  Verbal consent obtained due to current National Emergency  Relationship to patient: Self Contact Name: Bradley Soto Call Date: 11/03/19  Time: 1218 Phone: [phone] Outcome: No Answer/Busy Important Message mailed to: Patient address on file    Delorse Lek 11/03/2019, 12:18 PM

## 2019-11-03 NOTE — Progress Notes (Signed)
New Admission Note:   Arrival Method: ED via stretcher Mental Orientation: Alert and oriented x4 Telemetry: 5M03, CCMD notified Assessment: Completed Skin: Intact, warm and dry IV: L wrist, saline locked Pain: 0/10 Tubes: None Safety Measures: Safety Fall Prevention Plan has been discussed  Admission: completed 5 [place] Orientation: Patient has been orientated to the room, unit and staff.   Family: none ate bedside  Orders to be reviewed and implemented. Will continue to monitor the patient. Call light has been placed within reach and bed alarm has been activated.

## 2019-11-03 NOTE — Progress Notes (Signed)
Patient cleared for discharge by Attending and Nephrologist. Clinic aware that he will start tomorrow, Saturday, 11/04/19. In order to do so, he needs to sign papers at [facility] today on his way home. Patient/Wife, Attending and RN aware. Renal NP to send OP HD orders to clinic.  Alphonzo Cruise, [place] Renal Navigator [phone]

## 2019-11-03 NOTE — Progress Notes (Signed)
ABI's have been completed. Preliminary results can be found in CV Proc through chart review.   11/03/19 9:28 AM Bradley Soto RVT

## 2019-11-04 ENCOUNTER — Telehealth: Payer: Self-pay | Admitting: Nurse Practitioner

## 2019-11-04 DIAGNOSIS — N2581 Secondary hyperparathyroidism of renal origin: Secondary | ICD-10-CM | POA: Diagnosis not present

## 2019-11-04 DIAGNOSIS — E1122 Type 2 diabetes mellitus with diabetic chronic kidney disease: Secondary | ICD-10-CM | POA: Diagnosis not present

## 2019-11-04 DIAGNOSIS — Z23 Encounter for immunization: Secondary | ICD-10-CM | POA: Diagnosis not present

## 2019-11-04 DIAGNOSIS — Z992 Dependence on renal dialysis: Secondary | ICD-10-CM | POA: Diagnosis not present

## 2019-11-04 DIAGNOSIS — I129 Hypertensive chronic kidney disease with stage 1 through stage 4 chronic kidney disease, or unspecified chronic kidney disease: Secondary | ICD-10-CM | POA: Diagnosis not present

## 2019-11-04 DIAGNOSIS — D509 Iron deficiency anemia, unspecified: Secondary | ICD-10-CM | POA: Diagnosis not present

## 2019-11-04 DIAGNOSIS — D631 Anemia in chronic kidney disease: Secondary | ICD-10-CM | POA: Diagnosis not present

## 2019-11-04 DIAGNOSIS — N186 End stage renal disease: Secondary | ICD-10-CM | POA: Diagnosis not present

## 2019-11-04 LAB — PARATHYROID HORMONE, INTACT (NO CA): PTH: 247 pg/mL — ABNORMAL HIGH (ref 15–65)

## 2019-11-04 NOTE — Telephone Encounter (Signed)
Transition of care contact from inpatient facility  Date of discharge: 11/03/2019 Date of contact: 11/04/2019 Method: Phone Spoke to: Patient  Patient contacted to discuss transition of care from recent inpatient hospitalization. Patient was admitted to [facility] from 10/31/2019-11/03/2019 with discharge diagnosis of CKD IV/V to ESRD, new start to HD.   Medication changes were reviewed.  Patient will follow up with his/her outpatient HD unit on: 11/04/2019

## 2019-11-05 LAB — CULTURE, BLOOD (ROUTINE X 2)
Culture: NO GROWTH
Culture: NO GROWTH
Special Requests: ADEQUATE

## 2019-11-07 DIAGNOSIS — D509 Iron deficiency anemia, unspecified: Secondary | ICD-10-CM | POA: Diagnosis not present

## 2019-11-07 DIAGNOSIS — E1122 Type 2 diabetes mellitus with diabetic chronic kidney disease: Secondary | ICD-10-CM | POA: Diagnosis not present

## 2019-11-07 DIAGNOSIS — Z992 Dependence on renal dialysis: Secondary | ICD-10-CM | POA: Diagnosis not present

## 2019-11-07 DIAGNOSIS — N186 End stage renal disease: Secondary | ICD-10-CM | POA: Diagnosis not present

## 2019-11-07 DIAGNOSIS — N2581 Secondary hyperparathyroidism of renal origin: Secondary | ICD-10-CM | POA: Diagnosis not present

## 2019-11-07 DIAGNOSIS — D631 Anemia in chronic kidney disease: Secondary | ICD-10-CM | POA: Diagnosis not present

## 2019-11-09 DIAGNOSIS — N185 Chronic kidney disease, stage 5: Secondary | ICD-10-CM | POA: Diagnosis not present

## 2019-11-09 DIAGNOSIS — Z992 Dependence on renal dialysis: Secondary | ICD-10-CM | POA: Diagnosis not present

## 2019-11-09 DIAGNOSIS — N2581 Secondary hyperparathyroidism of renal origin: Secondary | ICD-10-CM | POA: Diagnosis not present

## 2019-11-09 DIAGNOSIS — D509 Iron deficiency anemia, unspecified: Secondary | ICD-10-CM | POA: Diagnosis not present

## 2019-11-09 DIAGNOSIS — E1122 Type 2 diabetes mellitus with diabetic chronic kidney disease: Secondary | ICD-10-CM | POA: Diagnosis not present

## 2019-11-09 DIAGNOSIS — D631 Anemia in chronic kidney disease: Secondary | ICD-10-CM | POA: Diagnosis not present

## 2019-11-09 DIAGNOSIS — E1129 Type 2 diabetes mellitus with other diabetic kidney complication: Secondary | ICD-10-CM | POA: Diagnosis not present

## 2019-11-09 DIAGNOSIS — N186 End stage renal disease: Secondary | ICD-10-CM | POA: Diagnosis not present

## 2019-11-11 DIAGNOSIS — D631 Anemia in chronic kidney disease: Secondary | ICD-10-CM | POA: Diagnosis not present

## 2019-11-11 DIAGNOSIS — E1122 Type 2 diabetes mellitus with diabetic chronic kidney disease: Secondary | ICD-10-CM | POA: Diagnosis not present

## 2019-11-11 DIAGNOSIS — Z992 Dependence on renal dialysis: Secondary | ICD-10-CM | POA: Diagnosis not present

## 2019-11-11 DIAGNOSIS — N2581 Secondary hyperparathyroidism of renal origin: Secondary | ICD-10-CM | POA: Diagnosis not present

## 2019-11-11 DIAGNOSIS — N186 End stage renal disease: Secondary | ICD-10-CM | POA: Diagnosis not present

## 2019-11-11 DIAGNOSIS — D509 Iron deficiency anemia, unspecified: Secondary | ICD-10-CM | POA: Diagnosis not present

## 2019-11-14 DIAGNOSIS — D509 Iron deficiency anemia, unspecified: Secondary | ICD-10-CM | POA: Diagnosis not present

## 2019-11-14 DIAGNOSIS — N186 End stage renal disease: Secondary | ICD-10-CM | POA: Diagnosis not present

## 2019-11-14 DIAGNOSIS — D631 Anemia in chronic kidney disease: Secondary | ICD-10-CM | POA: Diagnosis not present

## 2019-11-14 DIAGNOSIS — N2581 Secondary hyperparathyroidism of renal origin: Secondary | ICD-10-CM | POA: Diagnosis not present

## 2019-11-14 DIAGNOSIS — Z992 Dependence on renal dialysis: Secondary | ICD-10-CM | POA: Diagnosis not present

## 2019-11-14 DIAGNOSIS — E1122 Type 2 diabetes mellitus with diabetic chronic kidney disease: Secondary | ICD-10-CM | POA: Diagnosis not present

## 2019-11-16 DIAGNOSIS — D509 Iron deficiency anemia, unspecified: Secondary | ICD-10-CM | POA: Diagnosis not present

## 2019-11-16 DIAGNOSIS — N186 End stage renal disease: Secondary | ICD-10-CM | POA: Diagnosis not present

## 2019-11-16 DIAGNOSIS — D631 Anemia in chronic kidney disease: Secondary | ICD-10-CM | POA: Diagnosis not present

## 2019-11-16 DIAGNOSIS — N2581 Secondary hyperparathyroidism of renal origin: Secondary | ICD-10-CM | POA: Diagnosis not present

## 2019-11-16 DIAGNOSIS — E1122 Type 2 diabetes mellitus with diabetic chronic kidney disease: Secondary | ICD-10-CM | POA: Diagnosis not present

## 2019-11-16 DIAGNOSIS — Z992 Dependence on renal dialysis: Secondary | ICD-10-CM | POA: Diagnosis not present

## 2019-11-17 DIAGNOSIS — E1129 Type 2 diabetes mellitus with other diabetic kidney complication: Secondary | ICD-10-CM | POA: Diagnosis not present

## 2019-11-17 DIAGNOSIS — Z992 Dependence on renal dialysis: Secondary | ICD-10-CM | POA: Diagnosis not present

## 2019-11-17 DIAGNOSIS — N186 End stage renal disease: Secondary | ICD-10-CM | POA: Diagnosis not present

## 2019-11-18 DIAGNOSIS — Z992 Dependence on renal dialysis: Secondary | ICD-10-CM | POA: Diagnosis not present

## 2019-11-18 DIAGNOSIS — N2581 Secondary hyperparathyroidism of renal origin: Secondary | ICD-10-CM | POA: Diagnosis not present

## 2019-11-18 DIAGNOSIS — T82590A Other mechanical complication of surgically created arteriovenous fistula, initial encounter: Secondary | ICD-10-CM | POA: Diagnosis not present

## 2019-11-18 DIAGNOSIS — D509 Iron deficiency anemia, unspecified: Secondary | ICD-10-CM | POA: Diagnosis not present

## 2019-11-18 DIAGNOSIS — Z23 Encounter for immunization: Secondary | ICD-10-CM | POA: Diagnosis not present

## 2019-11-18 DIAGNOSIS — N186 End stage renal disease: Secondary | ICD-10-CM | POA: Diagnosis not present

## 2019-11-18 DIAGNOSIS — E1122 Type 2 diabetes mellitus with diabetic chronic kidney disease: Secondary | ICD-10-CM | POA: Diagnosis not present

## 2019-11-18 DIAGNOSIS — E8770 Fluid overload, unspecified: Secondary | ICD-10-CM | POA: Diagnosis not present

## 2019-11-18 DIAGNOSIS — D631 Anemia in chronic kidney disease: Secondary | ICD-10-CM | POA: Diagnosis not present

## 2019-11-21 DIAGNOSIS — D509 Iron deficiency anemia, unspecified: Secondary | ICD-10-CM | POA: Diagnosis not present

## 2019-11-21 DIAGNOSIS — E1122 Type 2 diabetes mellitus with diabetic chronic kidney disease: Secondary | ICD-10-CM | POA: Diagnosis not present

## 2019-11-21 DIAGNOSIS — D631 Anemia in chronic kidney disease: Secondary | ICD-10-CM | POA: Diagnosis not present

## 2019-11-21 DIAGNOSIS — N2581 Secondary hyperparathyroidism of renal origin: Secondary | ICD-10-CM | POA: Diagnosis not present

## 2019-11-21 DIAGNOSIS — Z992 Dependence on renal dialysis: Secondary | ICD-10-CM | POA: Diagnosis not present

## 2019-11-21 DIAGNOSIS — N186 End stage renal disease: Secondary | ICD-10-CM | POA: Diagnosis not present

## 2019-11-23 DIAGNOSIS — E1122 Type 2 diabetes mellitus with diabetic chronic kidney disease: Secondary | ICD-10-CM | POA: Diagnosis not present

## 2019-11-23 DIAGNOSIS — D509 Iron deficiency anemia, unspecified: Secondary | ICD-10-CM | POA: Diagnosis not present

## 2019-11-23 DIAGNOSIS — N2581 Secondary hyperparathyroidism of renal origin: Secondary | ICD-10-CM | POA: Diagnosis not present

## 2019-11-23 DIAGNOSIS — D631 Anemia in chronic kidney disease: Secondary | ICD-10-CM | POA: Diagnosis not present

## 2019-11-23 DIAGNOSIS — Z992 Dependence on renal dialysis: Secondary | ICD-10-CM | POA: Diagnosis not present

## 2019-11-23 DIAGNOSIS — N186 End stage renal disease: Secondary | ICD-10-CM | POA: Diagnosis not present

## 2019-11-24 DIAGNOSIS — Z992 Dependence on renal dialysis: Secondary | ICD-10-CM | POA: Diagnosis not present

## 2019-11-24 DIAGNOSIS — D509 Iron deficiency anemia, unspecified: Secondary | ICD-10-CM | POA: Diagnosis not present

## 2019-11-24 DIAGNOSIS — E1122 Type 2 diabetes mellitus with diabetic chronic kidney disease: Secondary | ICD-10-CM | POA: Diagnosis not present

## 2019-11-24 DIAGNOSIS — N2581 Secondary hyperparathyroidism of renal origin: Secondary | ICD-10-CM | POA: Diagnosis not present

## 2019-11-24 DIAGNOSIS — Z23 Encounter for immunization: Secondary | ICD-10-CM | POA: Diagnosis not present

## 2019-11-24 DIAGNOSIS — N186 End stage renal disease: Secondary | ICD-10-CM | POA: Diagnosis not present

## 2019-11-24 DIAGNOSIS — D631 Anemia in chronic kidney disease: Secondary | ICD-10-CM | POA: Diagnosis not present

## 2019-11-25 DIAGNOSIS — Z992 Dependence on renal dialysis: Secondary | ICD-10-CM | POA: Diagnosis not present

## 2019-11-25 DIAGNOSIS — E1122 Type 2 diabetes mellitus with diabetic chronic kidney disease: Secondary | ICD-10-CM | POA: Diagnosis not present

## 2019-11-25 DIAGNOSIS — D631 Anemia in chronic kidney disease: Secondary | ICD-10-CM | POA: Diagnosis not present

## 2019-11-25 DIAGNOSIS — D509 Iron deficiency anemia, unspecified: Secondary | ICD-10-CM | POA: Diagnosis not present

## 2019-11-25 DIAGNOSIS — N186 End stage renal disease: Secondary | ICD-10-CM | POA: Diagnosis not present

## 2019-11-25 DIAGNOSIS — N2581 Secondary hyperparathyroidism of renal origin: Secondary | ICD-10-CM | POA: Diagnosis not present

## 2019-11-28 DIAGNOSIS — N2581 Secondary hyperparathyroidism of renal origin: Secondary | ICD-10-CM | POA: Diagnosis not present

## 2019-11-28 DIAGNOSIS — D631 Anemia in chronic kidney disease: Secondary | ICD-10-CM | POA: Diagnosis not present

## 2019-11-28 DIAGNOSIS — N186 End stage renal disease: Secondary | ICD-10-CM | POA: Diagnosis not present

## 2019-11-28 DIAGNOSIS — D509 Iron deficiency anemia, unspecified: Secondary | ICD-10-CM | POA: Diagnosis not present

## 2019-11-28 DIAGNOSIS — E1122 Type 2 diabetes mellitus with diabetic chronic kidney disease: Secondary | ICD-10-CM | POA: Diagnosis not present

## 2019-11-28 DIAGNOSIS — Z992 Dependence on renal dialysis: Secondary | ICD-10-CM | POA: Diagnosis not present

## 2019-11-30 DIAGNOSIS — Z992 Dependence on renal dialysis: Secondary | ICD-10-CM | POA: Diagnosis not present

## 2019-11-30 DIAGNOSIS — D509 Iron deficiency anemia, unspecified: Secondary | ICD-10-CM | POA: Diagnosis not present

## 2019-11-30 DIAGNOSIS — D631 Anemia in chronic kidney disease: Secondary | ICD-10-CM | POA: Diagnosis not present

## 2019-11-30 DIAGNOSIS — E1122 Type 2 diabetes mellitus with diabetic chronic kidney disease: Secondary | ICD-10-CM | POA: Diagnosis not present

## 2019-11-30 DIAGNOSIS — N2581 Secondary hyperparathyroidism of renal origin: Secondary | ICD-10-CM | POA: Diagnosis not present

## 2019-11-30 DIAGNOSIS — N186 End stage renal disease: Secondary | ICD-10-CM | POA: Diagnosis not present

## 2019-12-01 ENCOUNTER — Ambulatory Visit (INDEPENDENT_AMBULATORY_CARE_PROVIDER_SITE_OTHER): Payer: Medicare Other

## 2019-12-01 DIAGNOSIS — I495 Sick sinus syndrome: Secondary | ICD-10-CM

## 2019-12-01 DIAGNOSIS — I480 Paroxysmal atrial fibrillation: Secondary | ICD-10-CM

## 2019-12-02 DIAGNOSIS — N186 End stage renal disease: Secondary | ICD-10-CM | POA: Diagnosis not present

## 2019-12-02 DIAGNOSIS — D509 Iron deficiency anemia, unspecified: Secondary | ICD-10-CM | POA: Diagnosis not present

## 2019-12-02 DIAGNOSIS — N2581 Secondary hyperparathyroidism of renal origin: Secondary | ICD-10-CM | POA: Diagnosis not present

## 2019-12-02 DIAGNOSIS — Z992 Dependence on renal dialysis: Secondary | ICD-10-CM | POA: Diagnosis not present

## 2019-12-02 DIAGNOSIS — D631 Anemia in chronic kidney disease: Secondary | ICD-10-CM | POA: Diagnosis not present

## 2019-12-02 DIAGNOSIS — E1122 Type 2 diabetes mellitus with diabetic chronic kidney disease: Secondary | ICD-10-CM | POA: Diagnosis not present

## 2019-12-02 LAB — CUP PACEART REMOTE DEVICE CHECK
Battery Remaining Longevity: 124 mo
Battery Voltage: 3.18 V
Brady Statistic AP VP Percent: 0.01 %
Brady Statistic AP VS Percent: 29.79 %
Brady Statistic AS VP Percent: 0.03 %
Brady Statistic AS VS Percent: 70.16 %
Brady Statistic RA Percent Paced: 29.87 %
Brady Statistic RV Percent Paced: 0.04 %
Date Time Interrogation Session: 20211014231737
Implantable Lead Implant Date: 20020325
Implantable Lead Implant Date: 20020325
Implantable Lead Location: 753859
Implantable Lead Location: 753860
Implantable Lead Model: 4092
Implantable Lead Model: 4524
Implantable Pulse Generator Implant Date: 20210415
Lead Channel Impedance Value: 266 Ohm
Lead Channel Impedance Value: 304 Ohm
Lead Channel Impedance Value: 456 Ohm
Lead Channel Impedance Value: 551 Ohm
Lead Channel Pacing Threshold Amplitude: 0.75 V
Lead Channel Pacing Threshold Amplitude: 2.5 V
Lead Channel Pacing Threshold Pulse Width: 0.4 ms
Lead Channel Pacing Threshold Pulse Width: 0.4 ms
Lead Channel Sensing Intrinsic Amplitude: 1 mV
Lead Channel Sensing Intrinsic Amplitude: 1 mV
Lead Channel Sensing Intrinsic Amplitude: 21.5 mV
Lead Channel Sensing Intrinsic Amplitude: 21.5 mV
Lead Channel Setting Pacing Amplitude: 2.5 V
Lead Channel Setting Pacing Amplitude: 3 V
Lead Channel Setting Pacing Pulse Width: 0.4 ms
Lead Channel Setting Sensing Sensitivity: 1.2 mV

## 2019-12-05 DIAGNOSIS — N2581 Secondary hyperparathyroidism of renal origin: Secondary | ICD-10-CM | POA: Diagnosis not present

## 2019-12-05 DIAGNOSIS — D631 Anemia in chronic kidney disease: Secondary | ICD-10-CM | POA: Diagnosis not present

## 2019-12-05 DIAGNOSIS — D509 Iron deficiency anemia, unspecified: Secondary | ICD-10-CM | POA: Diagnosis not present

## 2019-12-05 DIAGNOSIS — E1122 Type 2 diabetes mellitus with diabetic chronic kidney disease: Secondary | ICD-10-CM | POA: Diagnosis not present

## 2019-12-05 DIAGNOSIS — N186 End stage renal disease: Secondary | ICD-10-CM | POA: Diagnosis not present

## 2019-12-05 DIAGNOSIS — Z992 Dependence on renal dialysis: Secondary | ICD-10-CM | POA: Diagnosis not present

## 2019-12-06 NOTE — Progress Notes (Signed)
Remote pacemaker transmission.   

## 2019-12-07 DIAGNOSIS — E1122 Type 2 diabetes mellitus with diabetic chronic kidney disease: Secondary | ICD-10-CM | POA: Diagnosis not present

## 2019-12-07 DIAGNOSIS — N186 End stage renal disease: Secondary | ICD-10-CM | POA: Diagnosis not present

## 2019-12-07 DIAGNOSIS — D631 Anemia in chronic kidney disease: Secondary | ICD-10-CM | POA: Diagnosis not present

## 2019-12-07 DIAGNOSIS — E039 Hypothyroidism, unspecified: Secondary | ICD-10-CM | POA: Diagnosis not present

## 2019-12-07 DIAGNOSIS — Z992 Dependence on renal dialysis: Secondary | ICD-10-CM | POA: Diagnosis not present

## 2019-12-07 DIAGNOSIS — D509 Iron deficiency anemia, unspecified: Secondary | ICD-10-CM | POA: Diagnosis not present

## 2019-12-07 DIAGNOSIS — N2581 Secondary hyperparathyroidism of renal origin: Secondary | ICD-10-CM | POA: Diagnosis not present

## 2019-12-09 DIAGNOSIS — N186 End stage renal disease: Secondary | ICD-10-CM | POA: Diagnosis not present

## 2019-12-09 DIAGNOSIS — D631 Anemia in chronic kidney disease: Secondary | ICD-10-CM | POA: Diagnosis not present

## 2019-12-09 DIAGNOSIS — E1122 Type 2 diabetes mellitus with diabetic chronic kidney disease: Secondary | ICD-10-CM | POA: Diagnosis not present

## 2019-12-09 DIAGNOSIS — D509 Iron deficiency anemia, unspecified: Secondary | ICD-10-CM | POA: Diagnosis not present

## 2019-12-09 DIAGNOSIS — Z992 Dependence on renal dialysis: Secondary | ICD-10-CM | POA: Diagnosis not present

## 2019-12-09 DIAGNOSIS — N2581 Secondary hyperparathyroidism of renal origin: Secondary | ICD-10-CM | POA: Diagnosis not present

## 2019-12-12 DIAGNOSIS — D509 Iron deficiency anemia, unspecified: Secondary | ICD-10-CM | POA: Diagnosis not present

## 2019-12-12 DIAGNOSIS — E1122 Type 2 diabetes mellitus with diabetic chronic kidney disease: Secondary | ICD-10-CM | POA: Diagnosis not present

## 2019-12-12 DIAGNOSIS — N2581 Secondary hyperparathyroidism of renal origin: Secondary | ICD-10-CM | POA: Diagnosis not present

## 2019-12-12 DIAGNOSIS — D631 Anemia in chronic kidney disease: Secondary | ICD-10-CM | POA: Diagnosis not present

## 2019-12-12 DIAGNOSIS — N186 End stage renal disease: Secondary | ICD-10-CM | POA: Diagnosis not present

## 2019-12-12 DIAGNOSIS — Z992 Dependence on renal dialysis: Secondary | ICD-10-CM | POA: Diagnosis not present

## 2019-12-14 DIAGNOSIS — D631 Anemia in chronic kidney disease: Secondary | ICD-10-CM | POA: Diagnosis not present

## 2019-12-14 DIAGNOSIS — N186 End stage renal disease: Secondary | ICD-10-CM | POA: Diagnosis not present

## 2019-12-14 DIAGNOSIS — Z992 Dependence on renal dialysis: Secondary | ICD-10-CM | POA: Diagnosis not present

## 2019-12-14 DIAGNOSIS — N2581 Secondary hyperparathyroidism of renal origin: Secondary | ICD-10-CM | POA: Diagnosis not present

## 2019-12-14 DIAGNOSIS — D509 Iron deficiency anemia, unspecified: Secondary | ICD-10-CM | POA: Diagnosis not present

## 2019-12-14 DIAGNOSIS — E1122 Type 2 diabetes mellitus with diabetic chronic kidney disease: Secondary | ICD-10-CM | POA: Diagnosis not present

## 2019-12-16 DIAGNOSIS — D631 Anemia in chronic kidney disease: Secondary | ICD-10-CM | POA: Diagnosis not present

## 2019-12-16 DIAGNOSIS — D509 Iron deficiency anemia, unspecified: Secondary | ICD-10-CM | POA: Diagnosis not present

## 2019-12-16 DIAGNOSIS — N186 End stage renal disease: Secondary | ICD-10-CM | POA: Diagnosis not present

## 2019-12-16 DIAGNOSIS — E1122 Type 2 diabetes mellitus with diabetic chronic kidney disease: Secondary | ICD-10-CM | POA: Diagnosis not present

## 2019-12-16 DIAGNOSIS — N2581 Secondary hyperparathyroidism of renal origin: Secondary | ICD-10-CM | POA: Diagnosis not present

## 2019-12-16 DIAGNOSIS — Z992 Dependence on renal dialysis: Secondary | ICD-10-CM | POA: Diagnosis not present

## 2019-12-18 DIAGNOSIS — I129 Hypertensive chronic kidney disease with stage 1 through stage 4 chronic kidney disease, or unspecified chronic kidney disease: Secondary | ICD-10-CM | POA: Diagnosis not present

## 2019-12-18 DIAGNOSIS — N186 End stage renal disease: Secondary | ICD-10-CM | POA: Diagnosis not present

## 2019-12-18 DIAGNOSIS — Z992 Dependence on renal dialysis: Secondary | ICD-10-CM | POA: Diagnosis not present

## 2019-12-19 DIAGNOSIS — N186 End stage renal disease: Secondary | ICD-10-CM | POA: Diagnosis not present

## 2019-12-19 DIAGNOSIS — Z23 Encounter for immunization: Secondary | ICD-10-CM | POA: Diagnosis not present

## 2019-12-19 DIAGNOSIS — D509 Iron deficiency anemia, unspecified: Secondary | ICD-10-CM | POA: Diagnosis not present

## 2019-12-19 DIAGNOSIS — N2581 Secondary hyperparathyroidism of renal origin: Secondary | ICD-10-CM | POA: Diagnosis not present

## 2019-12-19 DIAGNOSIS — Z992 Dependence on renal dialysis: Secondary | ICD-10-CM | POA: Diagnosis not present

## 2019-12-19 DIAGNOSIS — E1122 Type 2 diabetes mellitus with diabetic chronic kidney disease: Secondary | ICD-10-CM | POA: Diagnosis not present

## 2019-12-19 DIAGNOSIS — D631 Anemia in chronic kidney disease: Secondary | ICD-10-CM | POA: Diagnosis not present

## 2019-12-21 DIAGNOSIS — Z992 Dependence on renal dialysis: Secondary | ICD-10-CM | POA: Diagnosis not present

## 2019-12-21 DIAGNOSIS — E1122 Type 2 diabetes mellitus with diabetic chronic kidney disease: Secondary | ICD-10-CM | POA: Diagnosis not present

## 2019-12-21 DIAGNOSIS — N2581 Secondary hyperparathyroidism of renal origin: Secondary | ICD-10-CM | POA: Diagnosis not present

## 2019-12-21 DIAGNOSIS — N186 End stage renal disease: Secondary | ICD-10-CM | POA: Diagnosis not present

## 2019-12-21 DIAGNOSIS — D631 Anemia in chronic kidney disease: Secondary | ICD-10-CM | POA: Diagnosis not present

## 2019-12-21 DIAGNOSIS — D509 Iron deficiency anemia, unspecified: Secondary | ICD-10-CM | POA: Diagnosis not present

## 2019-12-23 DIAGNOSIS — N2581 Secondary hyperparathyroidism of renal origin: Secondary | ICD-10-CM | POA: Diagnosis not present

## 2019-12-23 DIAGNOSIS — N186 End stage renal disease: Secondary | ICD-10-CM | POA: Diagnosis not present

## 2019-12-23 DIAGNOSIS — E1122 Type 2 diabetes mellitus with diabetic chronic kidney disease: Secondary | ICD-10-CM | POA: Diagnosis not present

## 2019-12-23 DIAGNOSIS — D631 Anemia in chronic kidney disease: Secondary | ICD-10-CM | POA: Diagnosis not present

## 2019-12-23 DIAGNOSIS — D509 Iron deficiency anemia, unspecified: Secondary | ICD-10-CM | POA: Diagnosis not present

## 2019-12-23 DIAGNOSIS — Z992 Dependence on renal dialysis: Secondary | ICD-10-CM | POA: Diagnosis not present

## 2019-12-26 DIAGNOSIS — D509 Iron deficiency anemia, unspecified: Secondary | ICD-10-CM | POA: Diagnosis not present

## 2019-12-26 DIAGNOSIS — Z992 Dependence on renal dialysis: Secondary | ICD-10-CM | POA: Diagnosis not present

## 2019-12-26 DIAGNOSIS — D631 Anemia in chronic kidney disease: Secondary | ICD-10-CM | POA: Diagnosis not present

## 2019-12-26 DIAGNOSIS — N186 End stage renal disease: Secondary | ICD-10-CM | POA: Diagnosis not present

## 2019-12-26 DIAGNOSIS — N2581 Secondary hyperparathyroidism of renal origin: Secondary | ICD-10-CM | POA: Diagnosis not present

## 2019-12-26 DIAGNOSIS — E1122 Type 2 diabetes mellitus with diabetic chronic kidney disease: Secondary | ICD-10-CM | POA: Diagnosis not present

## 2019-12-28 DIAGNOSIS — N186 End stage renal disease: Secondary | ICD-10-CM | POA: Diagnosis not present

## 2019-12-28 DIAGNOSIS — E1122 Type 2 diabetes mellitus with diabetic chronic kidney disease: Secondary | ICD-10-CM | POA: Diagnosis not present

## 2019-12-28 DIAGNOSIS — D631 Anemia in chronic kidney disease: Secondary | ICD-10-CM | POA: Diagnosis not present

## 2019-12-28 DIAGNOSIS — D509 Iron deficiency anemia, unspecified: Secondary | ICD-10-CM | POA: Diagnosis not present

## 2019-12-28 DIAGNOSIS — N2581 Secondary hyperparathyroidism of renal origin: Secondary | ICD-10-CM | POA: Diagnosis not present

## 2019-12-28 DIAGNOSIS — Z992 Dependence on renal dialysis: Secondary | ICD-10-CM | POA: Diagnosis not present

## 2019-12-30 DIAGNOSIS — Z992 Dependence on renal dialysis: Secondary | ICD-10-CM | POA: Diagnosis not present

## 2019-12-30 DIAGNOSIS — E1122 Type 2 diabetes mellitus with diabetic chronic kidney disease: Secondary | ICD-10-CM | POA: Diagnosis not present

## 2019-12-30 DIAGNOSIS — D509 Iron deficiency anemia, unspecified: Secondary | ICD-10-CM | POA: Diagnosis not present

## 2019-12-30 DIAGNOSIS — N2581 Secondary hyperparathyroidism of renal origin: Secondary | ICD-10-CM | POA: Diagnosis not present

## 2019-12-30 DIAGNOSIS — N186 End stage renal disease: Secondary | ICD-10-CM | POA: Diagnosis not present

## 2019-12-30 DIAGNOSIS — D631 Anemia in chronic kidney disease: Secondary | ICD-10-CM | POA: Diagnosis not present

## 2020-01-02 DIAGNOSIS — N2581 Secondary hyperparathyroidism of renal origin: Secondary | ICD-10-CM | POA: Diagnosis not present

## 2020-01-02 DIAGNOSIS — D509 Iron deficiency anemia, unspecified: Secondary | ICD-10-CM | POA: Diagnosis not present

## 2020-01-02 DIAGNOSIS — Z992 Dependence on renal dialysis: Secondary | ICD-10-CM | POA: Diagnosis not present

## 2020-01-02 DIAGNOSIS — E1122 Type 2 diabetes mellitus with diabetic chronic kidney disease: Secondary | ICD-10-CM | POA: Diagnosis not present

## 2020-01-02 DIAGNOSIS — D631 Anemia in chronic kidney disease: Secondary | ICD-10-CM | POA: Diagnosis not present

## 2020-01-02 DIAGNOSIS — N186 End stage renal disease: Secondary | ICD-10-CM | POA: Diagnosis not present

## 2020-01-04 DIAGNOSIS — N2581 Secondary hyperparathyroidism of renal origin: Secondary | ICD-10-CM | POA: Diagnosis not present

## 2020-01-04 DIAGNOSIS — E1122 Type 2 diabetes mellitus with diabetic chronic kidney disease: Secondary | ICD-10-CM | POA: Diagnosis not present

## 2020-01-04 DIAGNOSIS — N186 End stage renal disease: Secondary | ICD-10-CM | POA: Diagnosis not present

## 2020-01-04 DIAGNOSIS — D631 Anemia in chronic kidney disease: Secondary | ICD-10-CM | POA: Diagnosis not present

## 2020-01-04 DIAGNOSIS — D509 Iron deficiency anemia, unspecified: Secondary | ICD-10-CM | POA: Diagnosis not present

## 2020-01-04 DIAGNOSIS — Z992 Dependence on renal dialysis: Secondary | ICD-10-CM | POA: Diagnosis not present

## 2020-01-06 DIAGNOSIS — Z992 Dependence on renal dialysis: Secondary | ICD-10-CM | POA: Diagnosis not present

## 2020-01-06 DIAGNOSIS — E1122 Type 2 diabetes mellitus with diabetic chronic kidney disease: Secondary | ICD-10-CM | POA: Diagnosis not present

## 2020-01-06 DIAGNOSIS — D509 Iron deficiency anemia, unspecified: Secondary | ICD-10-CM | POA: Diagnosis not present

## 2020-01-06 DIAGNOSIS — D631 Anemia in chronic kidney disease: Secondary | ICD-10-CM | POA: Diagnosis not present

## 2020-01-06 DIAGNOSIS — N186 End stage renal disease: Secondary | ICD-10-CM | POA: Diagnosis not present

## 2020-01-06 DIAGNOSIS — N2581 Secondary hyperparathyroidism of renal origin: Secondary | ICD-10-CM | POA: Diagnosis not present

## 2020-01-08 DIAGNOSIS — D509 Iron deficiency anemia, unspecified: Secondary | ICD-10-CM | POA: Diagnosis not present

## 2020-01-08 DIAGNOSIS — N186 End stage renal disease: Secondary | ICD-10-CM | POA: Diagnosis not present

## 2020-01-08 DIAGNOSIS — E1122 Type 2 diabetes mellitus with diabetic chronic kidney disease: Secondary | ICD-10-CM | POA: Diagnosis not present

## 2020-01-08 DIAGNOSIS — D631 Anemia in chronic kidney disease: Secondary | ICD-10-CM | POA: Diagnosis not present

## 2020-01-08 DIAGNOSIS — N2581 Secondary hyperparathyroidism of renal origin: Secondary | ICD-10-CM | POA: Diagnosis not present

## 2020-01-08 DIAGNOSIS — Z992 Dependence on renal dialysis: Secondary | ICD-10-CM | POA: Diagnosis not present

## 2020-01-10 DIAGNOSIS — D509 Iron deficiency anemia, unspecified: Secondary | ICD-10-CM | POA: Diagnosis not present

## 2020-01-10 DIAGNOSIS — N2581 Secondary hyperparathyroidism of renal origin: Secondary | ICD-10-CM | POA: Diagnosis not present

## 2020-01-10 DIAGNOSIS — Z992 Dependence on renal dialysis: Secondary | ICD-10-CM | POA: Diagnosis not present

## 2020-01-10 DIAGNOSIS — N186 End stage renal disease: Secondary | ICD-10-CM | POA: Diagnosis not present

## 2020-01-10 DIAGNOSIS — D631 Anemia in chronic kidney disease: Secondary | ICD-10-CM | POA: Diagnosis not present

## 2020-01-10 DIAGNOSIS — E1122 Type 2 diabetes mellitus with diabetic chronic kidney disease: Secondary | ICD-10-CM | POA: Diagnosis not present

## 2020-01-13 DIAGNOSIS — D509 Iron deficiency anemia, unspecified: Secondary | ICD-10-CM | POA: Diagnosis not present

## 2020-01-13 DIAGNOSIS — E1122 Type 2 diabetes mellitus with diabetic chronic kidney disease: Secondary | ICD-10-CM | POA: Diagnosis not present

## 2020-01-13 DIAGNOSIS — Z992 Dependence on renal dialysis: Secondary | ICD-10-CM | POA: Diagnosis not present

## 2020-01-13 DIAGNOSIS — N186 End stage renal disease: Secondary | ICD-10-CM | POA: Diagnosis not present

## 2020-01-13 DIAGNOSIS — N2581 Secondary hyperparathyroidism of renal origin: Secondary | ICD-10-CM | POA: Diagnosis not present

## 2020-01-13 DIAGNOSIS — D631 Anemia in chronic kidney disease: Secondary | ICD-10-CM | POA: Diagnosis not present

## 2020-01-15 ENCOUNTER — Other Ambulatory Visit: Payer: Self-pay

## 2020-01-15 ENCOUNTER — Ambulatory Visit (INDEPENDENT_AMBULATORY_CARE_PROVIDER_SITE_OTHER): Payer: Medicare Other | Admitting: Surgery

## 2020-01-15 ENCOUNTER — Encounter: Payer: Self-pay | Admitting: Surgery

## 2020-01-15 VITALS — BP 130/74 | HR 76 | Resp 20 | Ht 71.0 in | Wt 215.5 lb

## 2020-01-15 DIAGNOSIS — Z992 Dependence on renal dialysis: Secondary | ICD-10-CM

## 2020-01-15 DIAGNOSIS — N186 End stage renal disease: Secondary | ICD-10-CM | POA: Diagnosis not present

## 2020-01-15 DIAGNOSIS — I2510 Atherosclerotic heart disease of native coronary artery without angina pectoris: Secondary | ICD-10-CM | POA: Diagnosis not present

## 2020-01-15 NOTE — H&P (View-Only) (Signed)
Vascular and Vein Specialist of [facility]  Patient name: Bradley Soto MRN: [id number] DOB: April 12, 1940 Sex: male   REASON FOR VISIT:    Follow-up  HISOTRY OF PRESENT ILLNESS:    Bradley Soto is a 79 y.o. male who is status post right brachiocephalic fistula creation on 07/28/2019.  At his follow-up visit he was complaining of numbness in his fingers.  He was brought back for repeat evaluation.  At that time he stated his right arm did fatigue with prolonged activity and he did have some residual numbness however it was not bothersome to him.  I discussed the possibility of banding his fistula however he was not interested at that time.  He now states that he has numbness on the lateral fingers as well as weakness and difficulty with fine motor skills.   PAST MEDICAL HISTORY:   Past Medical History:  Diagnosis Date  . Anxiety   . CAD (coronary artery disease)    s/p stent placement  . CKD (chronic kidney disease), stage III ([place])   . Colon polyps   . Diabetes mellitus without complication ([place])   . Gout   . HLD (hyperlipidemia)   . HTN (hypertension)   . Insomnia   . MI (myocardial infarction) ([place]) 1998  . NSTEMI (non-ST elevated myocardial infarction) ([place])    s/p PCI in early 2000s  . OSA on CPAP    wears CPAP  . SSS (sick sinus syndrome) ([place])   . Type II or unspecified type diabetes mellitus without mention of complication, not stated as uncontrolled   . Wears glasses    reading   . Wears partial dentures      FAMILY HISTORY:   Family History  Problem Relation Age of Onset  . Heart attack Father 37  . Heart disease Father   . Breast cancer Sister   . Pulmonary embolism Sister   . Deep vein thrombosis Sister     SOCIAL HISTORY:   Social History   Tobacco Use  . Smoking status: Former Smoker    Types: Cigarettes  . Smokeless tobacco: Never Used  . Tobacco comment: " many years ago"  Substance Use Topics  .  Alcohol use: No     ALLERGIES:   Allergies  Allergen Reactions  . Shellfish Allergy Rash     CURRENT MEDICATIONS:   Current Outpatient Medications  Medication Sig Dispense Refill  . allopurinol (ZYLOPRIM) 100 MG tablet Take 1 tablet (100 mg total) by mouth daily. 30 tablet 3  . ALPRAZolam (XANAX) 0.25 MG tablet Take 0.25 mg by mouth at bedtime as needed for sleep.    Marland Kitchen aspirin EC 81 MG tablet Take 81 mg by mouth daily.    . Calcium-Magnesium-Vitamin D (CITRACAL SLOW RELEASE PO) Take 1 tablet by mouth daily.    . Cholecalciferol (VITAMIN D-3) 25 MCG (1000 UT) CAPS Take 2,000 Units by mouth daily.     . diphenhydramine-acetaminophen (TYLENOL PM) 25-500 MG TABS tablet Take 0.5 tablets by mouth at bedtime.     . hydrALAZINE (APRESOLINE) 25 MG tablet Take 1 tablet (25 mg total) by mouth 3 (three) times daily. 90 tablet 3  . Ketotifen Fumarate (ALLERGY EYE DROPS OP) Place 1 drop into both eyes daily as needed (allergies).    . metoprolol tartrate (LOPRESSOR) 25 MG tablet Take 1.5 tablets (37.5 mg total) by mouth 2 (two) times daily. 270 tablet 3  . NOVOLOG MIX 70/30 FLEXPEN (70-30) 100 UNIT/ML FlexPen Inject 35 Units into the skin  2 (two) times daily with a meal.   3  . ondansetron (ZOFRAN) 4 MG tablet Take 1 tablet (4 mg total) by mouth every 6 (six) hours as needed for nausea. 30 tablet 1  . rosuvastatin (CRESTOR) 20 MG tablet TAKE 1 TABLET (20 MG TOTAL) BY MOUTH DAILY AT 6 PM. 90 tablet 3  . sevelamer carbonate (RENVELA) 800 MG tablet Take 1 tablet (800 mg total) by mouth 3 (three) times daily with meals. 90 tablet 3   No current facility-administered medications for this visit.    REVIEW OF SYSTEMS:   [X]  denotes positive finding, [ ]  denotes negative finding Cardiac  Comments:  Chest pain or chest pressure:    Shortness of breath upon exertion:    Short of breath when lying flat:    Irregular heart rhythm:        Vascular    Pain in calf, thigh, or hip brought on by  ambulation:    Pain in feet at night that wakes you up from your sleep:     Blood clot in your veins:    Leg swelling:         Pulmonary    Oxygen at home:    Productive cough:     Wheezing:         Neurologic    Sudden weakness in arms or legs:     Sudden numbness in arms or legs:     Sudden onset of difficulty speaking or slurred speech:    Temporary loss of vision in one eye:     Problems with dizziness:         Gastrointestinal    Blood in stool:     Vomited blood:         Genitourinary    Burning when urinating:     Blood in urine:        Psychiatric    Major depression:         Hematologic    Bleeding problems:    Problems with blood clotting too easily:        Skin    Rashes or ulcers:        Constitutional    Fever or chills:      PHYSICAL EXAM:   There were no vitals filed for this visit.  GENERAL: The patient is a well-nourished male, in no acute distress. The vital signs are documented above. CARDIAC: There is a regular rate and rhythm.  VASCULAR: Palpable thrill within fistula.  Radial pulses not palpable PULMONARY: Non-labored respirations NEUROLOGIC: No focal weakness or paresthesias are detected. SKIN: There are no ulcers or rashes noted. PSYCHIATRIC: The patient has a normal affect.  STUDIES:   None  MEDICAL ISSUES:   Right arm steal: I discussed either fistula ligation or banding of his fistula.  He has elected to proceed with banding.  I discussed the details of the procedure and the likelihood of success.  He would like to get this scheduled within the next 2 weeks.    Leia Alf, MD, FACS [facility] of [facility] [phone] Pager [phone]

## 2020-01-15 NOTE — Progress Notes (Signed)
Vascular and Vein Specialist of [facility]  Patient name: Bradley Soto MRN: [id number] DOB: 06-Feb-1941 Sex: male   REASON FOR VISIT:    Follow-up  HISOTRY OF PRESENT ILLNESS:    Bradley Soto is a 79 y.o. male who is status post right brachiocephalic fistula creation on 07/28/2019.  At his follow-up visit he was complaining of numbness in his fingers.  He was brought back for repeat evaluation.  At that time he stated his right arm did fatigue with prolonged activity and he did have some residual numbness however it was not bothersome to him.  I discussed the possibility of banding his fistula however he was not interested at that time.  He now states that he has numbness on the lateral fingers as well as weakness and difficulty with fine motor skills.   PAST MEDICAL HISTORY:   Past Medical History:  Diagnosis Date   Anxiety    CAD (coronary artery disease)    s/p stent placement   CKD (chronic kidney disease), stage III ([facility])    Colon polyps    Diabetes mellitus without complication ([place])    Gout    HLD (hyperlipidemia)    HTN (hypertension)    Insomnia    MI (myocardial infarction) ([place]) 1998   NSTEMI (non-ST elevated myocardial infarction) ([place])    s/p PCI in early 2000s   OSA on CPAP    wears CPAP   SSS (sick sinus syndrome) ([facility])    Type II or unspecified type diabetes mellitus without mention of complication, not stated as uncontrolled    Wears glasses    reading    Wears partial dentures      FAMILY HISTORY:   Family History  Problem Relation Age of Onset   Heart attack Father 38   Heart disease Father    Breast cancer Sister    Pulmonary embolism Sister    Deep vein thrombosis Sister     SOCIAL HISTORY:   Social History   Tobacco Use   Smoking status: Former Smoker    Types: Cigarettes   Smokeless tobacco: Never Used   Tobacco comment: " many years ago"  Substance Use Topics    Alcohol use: No     ALLERGIES:   Allergies  Allergen Reactions   Shellfish Allergy Rash     CURRENT MEDICATIONS:   Current Outpatient Medications  Medication Sig Dispense Refill   allopurinol (ZYLOPRIM) 100 MG tablet Take 1 tablet (100 mg total) by mouth daily. 30 tablet 3   ALPRAZolam (XANAX) 0.25 MG tablet Take 0.25 mg by mouth at bedtime as needed for sleep.     aspirin EC 81 MG tablet Take 81 mg by mouth daily.     Calcium-Magnesium-Vitamin D (CITRACAL SLOW RELEASE PO) Take 1 tablet by mouth daily.     Cholecalciferol (VITAMIN D-3) 25 MCG (1000 UT) CAPS Take 2,000 Units by mouth daily.      diphenhydramine-acetaminophen (TYLENOL PM) 25-500 MG TABS tablet Take 0.5 tablets by mouth at bedtime.      hydrALAZINE (APRESOLINE) 25 MG tablet Take 1 tablet (25 mg total) by mouth 3 (three) times daily. 90 tablet 3   Ketotifen Fumarate (ALLERGY EYE DROPS OP) Place 1 drop into both eyes daily as needed (allergies).     metoprolol tartrate (LOPRESSOR) 25 MG tablet Take 1.5 tablets (37.5 mg total) by mouth 2 (two) times daily. 270 tablet 3   NOVOLOG MIX 70/30 FLEXPEN (70-30) 100 UNIT/ML FlexPen Inject 35 Units into the skin  2 (two) times daily with a meal.   3   ondansetron (ZOFRAN) 4 MG tablet Take 1 tablet (4 mg total) by mouth every 6 (six) hours as needed for nausea. 30 tablet 1   rosuvastatin (CRESTOR) 20 MG tablet TAKE 1 TABLET (20 MG TOTAL) BY MOUTH DAILY AT 6 PM. 90 tablet 3   sevelamer carbonate (RENVELA) 800 MG tablet Take 1 tablet (800 mg total) by mouth 3 (three) times daily with meals. 90 tablet 3   No current facility-administered medications for this visit.    REVIEW OF SYSTEMS:   [X]  denotes positive finding, [ ]  denotes negative finding Cardiac  Comments:  Chest pain or chest pressure:    Shortness of breath upon exertion:    Short of breath when lying flat:    Irregular heart rhythm:        Vascular    Pain in calf, thigh, or hip brought on by  ambulation:    Pain in feet at night that wakes you up from your sleep:     Blood clot in your veins:    Leg swelling:         Pulmonary    Oxygen at home:    Productive cough:     Wheezing:         Neurologic    Sudden weakness in arms or legs:     Sudden numbness in arms or legs:     Sudden onset of difficulty speaking or slurred speech:    Temporary loss of vision in one eye:     Problems with dizziness:         Gastrointestinal    Blood in stool:     Vomited blood:         Genitourinary    Burning when urinating:     Blood in urine:        Psychiatric    Major depression:         Hematologic    Bleeding problems:    Problems with blood clotting too easily:        Skin    Rashes or ulcers:        Constitutional    Fever or chills:      PHYSICAL EXAM:   There were no vitals filed for this visit.  GENERAL: The patient is a well-nourished male, in no acute distress. The vital signs are documented above. CARDIAC: There is a regular rate and rhythm.  VASCULAR: Palpable thrill within fistula.  Radial pulses not palpable PULMONARY: Non-labored respirations NEUROLOGIC: No focal weakness or paresthesias are detected. SKIN: There are no ulcers or rashes noted. PSYCHIATRIC: The patient has a normal affect.  STUDIES:   None  MEDICAL ISSUES:   Right arm steal: I discussed either fistula ligation or banding of his fistula.  He has elected to proceed with banding.  I discussed the details of the procedure and the likelihood of success.  He would like to get this scheduled within the next 2 weeks.    Leia Alf, MD, FACS [facility] Vein Specialists of [facility] [phone] Pager [phone]

## 2020-01-16 DIAGNOSIS — N2581 Secondary hyperparathyroidism of renal origin: Secondary | ICD-10-CM | POA: Diagnosis not present

## 2020-01-16 DIAGNOSIS — D509 Iron deficiency anemia, unspecified: Secondary | ICD-10-CM | POA: Diagnosis not present

## 2020-01-16 DIAGNOSIS — E1122 Type 2 diabetes mellitus with diabetic chronic kidney disease: Secondary | ICD-10-CM | POA: Diagnosis not present

## 2020-01-16 DIAGNOSIS — N186 End stage renal disease: Secondary | ICD-10-CM | POA: Diagnosis not present

## 2020-01-16 DIAGNOSIS — D631 Anemia in chronic kidney disease: Secondary | ICD-10-CM | POA: Diagnosis not present

## 2020-01-16 DIAGNOSIS — Z992 Dependence on renal dialysis: Secondary | ICD-10-CM | POA: Diagnosis not present

## 2020-01-17 DIAGNOSIS — Z992 Dependence on renal dialysis: Secondary | ICD-10-CM | POA: Diagnosis not present

## 2020-01-17 DIAGNOSIS — N186 End stage renal disease: Secondary | ICD-10-CM | POA: Diagnosis not present

## 2020-01-17 DIAGNOSIS — I129 Hypertensive chronic kidney disease with stage 1 through stage 4 chronic kidney disease, or unspecified chronic kidney disease: Secondary | ICD-10-CM | POA: Diagnosis not present

## 2020-01-18 DIAGNOSIS — N186 End stage renal disease: Secondary | ICD-10-CM | POA: Diagnosis not present

## 2020-01-18 DIAGNOSIS — D509 Iron deficiency anemia, unspecified: Secondary | ICD-10-CM | POA: Diagnosis not present

## 2020-01-18 DIAGNOSIS — E1122 Type 2 diabetes mellitus with diabetic chronic kidney disease: Secondary | ICD-10-CM | POA: Diagnosis not present

## 2020-01-18 DIAGNOSIS — N2581 Secondary hyperparathyroidism of renal origin: Secondary | ICD-10-CM | POA: Diagnosis not present

## 2020-01-18 DIAGNOSIS — D631 Anemia in chronic kidney disease: Secondary | ICD-10-CM | POA: Diagnosis not present

## 2020-01-18 DIAGNOSIS — Z23 Encounter for immunization: Secondary | ICD-10-CM | POA: Diagnosis not present

## 2020-01-18 DIAGNOSIS — Z992 Dependence on renal dialysis: Secondary | ICD-10-CM | POA: Diagnosis not present

## 2020-01-20 DIAGNOSIS — N186 End stage renal disease: Secondary | ICD-10-CM | POA: Diagnosis not present

## 2020-01-20 DIAGNOSIS — D509 Iron deficiency anemia, unspecified: Secondary | ICD-10-CM | POA: Diagnosis not present

## 2020-01-20 DIAGNOSIS — E1122 Type 2 diabetes mellitus with diabetic chronic kidney disease: Secondary | ICD-10-CM | POA: Diagnosis not present

## 2020-01-20 DIAGNOSIS — N2581 Secondary hyperparathyroidism of renal origin: Secondary | ICD-10-CM | POA: Diagnosis not present

## 2020-01-20 DIAGNOSIS — D631 Anemia in chronic kidney disease: Secondary | ICD-10-CM | POA: Diagnosis not present

## 2020-01-20 DIAGNOSIS — Z992 Dependence on renal dialysis: Secondary | ICD-10-CM | POA: Diagnosis not present

## 2020-01-22 ENCOUNTER — Other Ambulatory Visit: Payer: Self-pay

## 2020-01-22 ENCOUNTER — Other Ambulatory Visit
Admission: RE | Admit: 2020-01-22 | Discharge: 2020-01-22 | Disposition: A | Discharge status: Home / Self Care | Payer: Medicare Other | Source: Ambulatory Visit | Attending: Surgery | Admitting: Surgery

## 2020-01-22 DIAGNOSIS — Z20822 Contact with and (suspected) exposure to covid-19: Secondary | ICD-10-CM | POA: Diagnosis not present

## 2020-01-22 DIAGNOSIS — Z01812 Encounter for preprocedural laboratory examination: Secondary | ICD-10-CM | POA: Diagnosis not present

## 2020-01-23 ENCOUNTER — Other Ambulatory Visit: Payer: Self-pay

## 2020-01-23 ENCOUNTER — Encounter (HOSPITAL_COMMUNITY): Payer: Self-pay | Admitting: Surgery

## 2020-01-23 ENCOUNTER — Encounter: Payer: Self-pay | Admitting: Internal Medicine

## 2020-01-23 ENCOUNTER — Telehealth: Payer: Self-pay

## 2020-01-23 DIAGNOSIS — D509 Iron deficiency anemia, unspecified: Secondary | ICD-10-CM | POA: Diagnosis not present

## 2020-01-23 DIAGNOSIS — N2581 Secondary hyperparathyroidism of renal origin: Secondary | ICD-10-CM | POA: Diagnosis not present

## 2020-01-23 DIAGNOSIS — N186 End stage renal disease: Secondary | ICD-10-CM | POA: Diagnosis not present

## 2020-01-23 DIAGNOSIS — D631 Anemia in chronic kidney disease: Secondary | ICD-10-CM | POA: Diagnosis not present

## 2020-01-23 DIAGNOSIS — Z992 Dependence on renal dialysis: Secondary | ICD-10-CM | POA: Diagnosis not present

## 2020-01-23 DIAGNOSIS — E1122 Type 2 diabetes mellitus with diabetic chronic kidney disease: Secondary | ICD-10-CM | POA: Diagnosis not present

## 2020-01-23 LAB — SARS CORONAVIRUS 2 (TAT 6-24 HRS): SARS Coronavirus 2: NEGATIVE

## 2020-01-23 MED ORDER — ALTEPLASE 2 MG IJ SOLR
2.0000 mg | Freq: Once | INTRAMUSCULAR | Status: DC | PRN
  Filled 2020-01-23: qty 2

## 2020-01-23 MED ORDER — SODIUM CHLORIDE 0.9 % IV SOLN: 100.0000 mL | INTRAVENOUS | Status: DC | PRN

## 2020-01-23 MED ORDER — LIDOCAINE HCL (PF) 1 % IJ SOLN
5.0000 mL | INTRAMUSCULAR | Status: DC | PRN
  Filled 2020-01-23: qty 5

## 2020-01-23 MED ORDER — HEPARIN SODIUM (PORCINE) 1000 UNIT/ML DIALYSIS
1000.0000 [IU] | INTRAMUSCULAR | Status: DC | PRN
  Filled 2020-01-23: qty 1

## 2020-01-23 MED ORDER — LIDOCAINE-PRILOCAINE 2.5-2.5 % EX CREA
1.0000 "application " | TOPICAL_CREAM | CUTANEOUS | Status: DC | PRN
  Filled 2020-01-23: qty 5

## 2020-01-23 MED ORDER — PENTAFLUOROPROP-TETRAFLUOROETH EX AERO
1.0000 "application " | INHALATION_SPRAY | CUTANEOUS | Status: DC | PRN
  Filled 2020-01-23: qty 116

## 2020-01-23 NOTE — Telephone Encounter (Signed)
Patient's wife called, confirmed approximate surgery times with her.

## 2020-01-23 NOTE — Progress Notes (Unsigned)
PERIOPERATIVE PRESCRIPTION FOR IMPLANTED CARDIAC DEVICE PROGRAMMING  Patient Information: Name:  Emanuelle Bastos  DOB:  1940/08/05  MRN:  [id number]  {TIP - You do not have to delete this tip  -  Copy the info from the staff message sent by the PAT staff  then press F2 here and paste the information using CTL - V on the next line :[id number]}  Planned Procedure: Banding of Right AVFistula  Surgeon: Dr,.Brabham  Date of Procedure: 01/24/20  Cautery will be used.  Position during surgery: Supine   Please send documentation back to:  [facility] (Fax # [phone])   Device Information:  Clinic EP Physician:  [name], MD   Device Type:  Pacemaker Manufacturer and Phone #:  [employer]tronic: [phone] Pacemaker Dependent?:  No. Date of Last Device Check:  11/30/19 Normal Device Function?:  Yes  Electrophysiologist's Recommendations:   Have magnet available.  Provide continuous ECG monitoring when magnet is used or reprogramming is to be performed.   Procedure may interfere with device function.  Magnet should be placed over device during procedure.  Per Device Clinic Standing Orders, Simone Curia, RN  5:30 PM 01/23/2020

## 2020-01-23 NOTE — Progress Notes (Signed)
I spoke to Mrs Sjogren, Nils Thor wife, that permission has been given to speak with wife Mr. Alcoser is at dialysis. Per Pati Gallo, Mr Bernard has not had any chest pain or shortness of breath. Patient tested negative for Covid_12/6/21_ and has been in quarantine since that time, went to dialysis today following protocol.  Mr.Galvis has type II diabetes,  Mizael Sagar reports that CBGs run 160-170. I instructed Mrs,. Zebrowski to have patient to take 24 units of 70/30 Insulin this evening and no Insulin in am. I instructed Arsh Feutz to ask Mr. Bebout  to check CBG after awaking and every 2 hours until arrival  to the hospital.  I Instructed patient if CBG is less than 70 to drink1/2 cup of a clear juice. Recheck CBG in 15 minutes then call pre- op desk at [phone] for further instructions

## 2020-01-24 ENCOUNTER — Encounter (HOSPITAL_COMMUNITY): Payer: Self-pay | Admitting: Surgery

## 2020-01-24 ENCOUNTER — Ambulatory Visit (HOSPITAL_COMMUNITY)
Admission: RE | Admit: 2020-01-24 | Discharge: 2020-01-24 | Disposition: A | Discharge status: Home / Self Care | Payer: Medicare Other | Attending: Surgery | Admitting: Surgery

## 2020-01-24 ENCOUNTER — Encounter (HOSPITAL_COMMUNITY)
Admission: RE | Disposition: A | Discharge status: Home / Self Care | Payer: Self-pay | Source: Home / Self Care | Attending: Surgery

## 2020-01-24 ENCOUNTER — Ambulatory Visit (HOSPITAL_COMMUNITY): Payer: Medicare Other

## 2020-01-24 DIAGNOSIS — Z79899 Other long term (current) drug therapy: Secondary | ICD-10-CM | POA: Insufficient documentation

## 2020-01-24 DIAGNOSIS — N186 End stage renal disease: Secondary | ICD-10-CM | POA: Diagnosis not present

## 2020-01-24 DIAGNOSIS — Z955 Presence of coronary angioplasty implant and graft: Secondary | ICD-10-CM | POA: Insufficient documentation

## 2020-01-24 DIAGNOSIS — T82898A Other specified complication of vascular prosthetic devices, implants and grafts, initial encounter: Secondary | ICD-10-CM | POA: Insufficient documentation

## 2020-01-24 DIAGNOSIS — I120 Hypertensive chronic kidney disease with stage 5 chronic kidney disease or end stage renal disease: Secondary | ICD-10-CM | POA: Diagnosis not present

## 2020-01-24 DIAGNOSIS — Z7982 Long term (current) use of aspirin: Secondary | ICD-10-CM | POA: Diagnosis not present

## 2020-01-24 DIAGNOSIS — T82858A Stenosis of vascular prosthetic devices, implants and grafts, initial encounter: Secondary | ICD-10-CM | POA: Diagnosis not present

## 2020-01-24 DIAGNOSIS — Y832 Surgical operation with anastomosis, bypass or graft as the cause of abnormal reaction of the patient, or of later complication, without mention of misadventure at the time of the procedure: Secondary | ICD-10-CM | POA: Diagnosis not present

## 2020-01-24 DIAGNOSIS — Z992 Dependence on renal dialysis: Secondary | ICD-10-CM | POA: Diagnosis not present

## 2020-01-24 DIAGNOSIS — N179 Acute kidney failure, unspecified: Secondary | ICD-10-CM | POA: Diagnosis not present

## 2020-01-24 DIAGNOSIS — E1122 Type 2 diabetes mellitus with diabetic chronic kidney disease: Secondary | ICD-10-CM | POA: Diagnosis not present

## 2020-01-24 DIAGNOSIS — Z87891 Personal history of nicotine dependence: Secondary | ICD-10-CM | POA: Insufficient documentation

## 2020-01-24 DIAGNOSIS — Z794 Long term (current) use of insulin: Secondary | ICD-10-CM | POA: Diagnosis not present

## 2020-01-24 HISTORY — DX: Bell's palsy: G51.0

## 2020-01-24 HISTORY — DX: Personal history of urinary calculi: Z87.442

## 2020-01-24 HISTORY — DX: Personal history of other medical treatment: Z92.89

## 2020-01-24 HISTORY — DX: Presence of cardiac pacemaker: Z95.0

## 2020-01-24 HISTORY — PX: REVISON OF ARTERIOVENOUS FISTULA: SHX6074

## 2020-01-24 LAB — POCT I-STAT, CHEM 8
BUN: 40 mg/dL — ABNORMAL HIGH (ref 8–23)
Calcium, Ion: 1.07 mmol/L — ABNORMAL LOW (ref 1.15–1.40)
Chloride: 102 mmol/L (ref 98–111)
Creatinine, Ser: 3.2 mg/dL — ABNORMAL HIGH (ref 0.61–1.24)
Glucose, Bld: 139 mg/dL — ABNORMAL HIGH (ref 70–99)
HCT: 30 % — ABNORMAL LOW (ref 39.0–52.0)
Hemoglobin: 10.2 g/dL — ABNORMAL LOW (ref 13.0–17.0)
Potassium: 4.4 mmol/L (ref 3.5–5.1)
Sodium: 140 mmol/L (ref 135–145)
TCO2: 27 mmol/L (ref 22–32)

## 2020-01-24 LAB — GLUCOSE, CAPILLARY
Glucose-Capillary: 128 mg/dL — ABNORMAL HIGH (ref 70–99)
Glucose-Capillary: 130 mg/dL — ABNORMAL HIGH (ref 70–99)

## 2020-01-24 SURGERY — REVISON OF ARTERIOVENOUS FISTULA
Anesthesia: Monitor Anesthesia Care | Site: Arm Upper | Laterality: Right

## 2020-01-24 MED ORDER — SODIUM CHLORIDE 0.9 % IV SOLN: INTRAVENOUS | Status: DC

## 2020-01-24 MED ORDER — SODIUM CHLORIDE 0.9 % IV SOLN
INTRAVENOUS | Status: DC | PRN
  Administered 2020-01-24: 500 mL

## 2020-01-24 MED ORDER — SODIUM CHLORIDE 0.9 % IV SOLN: INTRAVENOUS | Status: DC | PRN

## 2020-01-24 MED ORDER — CHLORHEXIDINE GLUCONATE 4 % EX LIQD: 60.0000 mL | Freq: Once | CUTANEOUS | Status: DC

## 2020-01-24 MED ORDER — CEFAZOLIN SODIUM-DEXTROSE 2-4 GM/100ML-% IV SOLN
2.0000 g | INTRAVENOUS | Status: AC
  Administered 2020-01-24: 2 g via INTRAVENOUS
  Filled 2020-01-24: qty 100

## 2020-01-24 MED ORDER — LIDOCAINE HCL (CARDIAC) PF 100 MG/5ML IV SOSY
PREFILLED_SYRINGE | INTRAVENOUS | Status: DC | PRN
  Administered 2020-01-24: 60 mg via INTRAVENOUS

## 2020-01-24 MED ORDER — CHLORHEXIDINE GLUCONATE 0.12 % MT SOLN
15.0000 mL | Freq: Once | OROMUCOSAL | Status: AC
  Administered 2020-01-24: 15 mL via OROMUCOSAL
  Filled 2020-01-24: qty 15

## 2020-01-24 MED ORDER — PROPOFOL 1000 MG/100ML IV EMUL
INTRAVENOUS | Status: AC
  Filled 2020-01-24: qty 100

## 2020-01-24 MED ORDER — HYDROCODONE-ACETAMINOPHEN 5-325 MG PO TABS: 1.0000 | ORAL_TABLET | Freq: Four times a day (QID) | ORAL | 0 refills | Status: DC | PRN

## 2020-01-24 MED ORDER — PROPOFOL 500 MG/50ML IV EMUL
INTRAVENOUS | Status: DC | PRN
  Administered 2020-01-24: 100 ug/kg/min via INTRAVENOUS

## 2020-01-24 MED ORDER — FENTANYL CITRATE (PF) 250 MCG/5ML IJ SOLN
INTRAMUSCULAR | Status: AC
  Filled 2020-01-24: qty 5

## 2020-01-24 MED ORDER — FENTANYL CITRATE (PF) 100 MCG/2ML IJ SOLN
INTRAMUSCULAR | Status: DC | PRN
  Administered 2020-01-24 (×2): 25 ug via INTRAVENOUS

## 2020-01-24 MED ORDER — LIDOCAINE-EPINEPHRINE (PF) 1 %-1:200000 IJ SOLN
INTRAMUSCULAR | Status: DC | PRN
  Administered 2020-01-24: 3 mL

## 2020-01-24 MED ORDER — ONDANSETRON HCL 4 MG/2ML IJ SOLN
INTRAMUSCULAR | Status: DC | PRN
  Administered 2020-01-24: 4 mg via INTRAVENOUS

## 2020-01-24 MED ORDER — LACTATED RINGERS IV SOLN: INTRAVENOUS | Status: DC

## 2020-01-24 MED ORDER — 0.9 % SODIUM CHLORIDE (POUR BTL) OPTIME
TOPICAL | Status: DC | PRN
  Administered 2020-01-24: 1000 mL

## 2020-01-24 MED ORDER — ORAL CARE MOUTH RINSE: 15.0000 mL | Freq: Once | OROMUCOSAL | Status: AC

## 2020-01-24 SURGICAL SUPPLY — 33 items
ARMBAND PINK RESTRICT EXTREMIT (MISCELLANEOUS) ×2 IMPLANT
CANISTER SUCT 3000ML PPV (MISCELLANEOUS) ×2 IMPLANT
CLIP VESOCCLUDE MED 6/CT (CLIP) ×2 IMPLANT
CLIP VESOCCLUDE SM WIDE 6/CT (CLIP) ×2 IMPLANT
COVER PROBE W GEL 5X96 (DRAPES) IMPLANT
COVER WAND RF STERILE (DRAPES) ×2 IMPLANT
DERMABOND ADVANCED (GAUZE/BANDAGES/DRESSINGS) ×1
DERMABOND ADVANCED .7 DNX12 (GAUZE/BANDAGES/DRESSINGS) ×1 IMPLANT
ELECT REM PT RETURN 9FT ADLT (ELECTROSURGICAL) ×2
ELECTRODE REM PT RTRN 9FT ADLT (ELECTROSURGICAL) ×1 IMPLANT
GLOVE BIOGEL PI IND STRL 7.5 (GLOVE) ×1 IMPLANT
GLOVE BIOGEL PI INDICATOR 7.5 (GLOVE) ×1
GLOVE SURG SS PI 7.5 STRL IVOR (GLOVE) ×2 IMPLANT
GOWN STRL REUS W/ TWL LRG LVL3 (GOWN DISPOSABLE) ×2 IMPLANT
GOWN STRL REUS W/ TWL XL LVL3 (GOWN DISPOSABLE) ×1 IMPLANT
GOWN STRL REUS W/TWL LRG LVL3 (GOWN DISPOSABLE) ×2
GOWN STRL REUS W/TWL XL LVL3 (GOWN DISPOSABLE) ×1
HEMOSTAT SNOW SURGICEL 2X4 (HEMOSTASIS) IMPLANT
KIT BASIN OR (CUSTOM PROCEDURE TRAY) ×2 IMPLANT
KIT TURNOVER KIT B (KITS) ×2 IMPLANT
NS IRRIG 1000ML POUR BTL (IV SOLUTION) ×2 IMPLANT
PACK CV ACCESS (CUSTOM PROCEDURE TRAY) ×2 IMPLANT
PAD ARMBOARD 7.5X6 YLW CONV (MISCELLANEOUS) ×4 IMPLANT
PATCH VASC XENOSURE 1CMX6CM (Vascular Products) ×1 IMPLANT
PATCH VASC XENOSURE 1X6 (Vascular Products) ×1 IMPLANT
SUT PROLENE 5 0 C 1 24 (SUTURE) ×8 IMPLANT
SUT PROLENE 6 0 CC (SUTURE) ×4 IMPLANT
SUT VIC AB 3-0 SH 27 (SUTURE) ×1
SUT VIC AB 3-0 SH 27X BRD (SUTURE) ×1 IMPLANT
SUT VICRYL 4-0 PS2 18IN ABS (SUTURE) IMPLANT
TOWEL GREEN STERILE (TOWEL DISPOSABLE) ×2 IMPLANT
UNDERPAD 30X36 HEAVY ABSORB (UNDERPADS AND DIAPERS) ×2 IMPLANT
WATER STERILE IRR 1000ML POUR (IV SOLUTION) ×2 IMPLANT

## 2020-01-24 NOTE — Op Note (Signed)
    Patient name: Bradley Soto MRN: [id number] DOB: 10/29/1940 Sex: male  01/24/2020 Pre-operative Diagnosis: right arm steal  Post-operative diagnosis:  Same Surgeon:  Annamarie Major Assistants:  Arlee Muslim Procedure:   Banding of right arm brachiocephalic fistula Anesthesia: MAC Blood Loss: Minimal Specimens: None  Findings: I used a bovine pericardial patch for banding.  The fistula was narrowed approximately 50%.  Afterwards the patient had a improved radial artery Doppler signal with maintained thrill in his fistula  Indications: The patient is status post right brachiocephalic fistula.  He has gotten progressively worsening steal syndrome.  He is opted to come in for banding.  Procedure:  The patient was identified in the holding area and taken to [place] 16  The patient was then placed supine on the table. MAC anesthesia was administered.  The patient was prepped and draped in the usual sterile fashion.  A time out was called and antibiotics were administered.  A PA was necessary to expedite the procedure and assist with the technical details including suction retraction and exposure.  A transverse incision was made just proximal to the antecubital crease after anesthetizing the skin with 1% lidocaine.  The cephalic vein fistula was circumferentially exposed.  This was approximately a 8 mm vein.  I selected a bovine pericardial patch, 1 cm in diameter and used this to place a band on the proximal fistula.  It was sutured into position with 5-0 Prolene.  This narrowed the proximal fistula approximately 50%.  There was an improved Doppler signal in the radial artery after banding.  The patient had preservation of a good thrill within this fistula.  The wound was then irrigated.  Hemostasis was achieved.  The incision was closed with 2 layers of 3-0 Vicryl followed by Dermabond.  There were no immediate complications.   Disposition: To PACU stable.   Theotis Burrow, M.D.,  [facility]ists of [place] Office: [phone] Pager:  [phone]

## 2020-01-24 NOTE — Anesthesia Preprocedure Evaluation (Addendum)
Anesthesia Evaluation  Patient identified by MRN, date of birth, ID band Patient awake    Reviewed: Allergy & Precautions, NPO status , Patient's Chart, lab work & pertinent test results  Airway Mallampati: II  TM Distance: >3 FB Neck ROM: Full    Dental no notable dental hx. (+) Teeth Intact, Dental Advisory Given   Pulmonary sleep apnea , former smoker,    Pulmonary exam normal breath sounds clear to auscultation       Cardiovascular hypertension, + angina + CAD, + Past MI and + CABG  Normal cardiovascular exam Rhythm:Regular Rate:Normal     Neuro/Psych Anxiety    GI/Hepatic negative GI ROS, Neg liver ROS,   Endo/Other  diabetes  Renal/GU Renal disease  negative genitourinary   Musculoskeletal negative musculoskeletal ROS (+)   Abdominal   Peds  Hematology   Anesthesia Other Findings   Reproductive/Obstetrics                           Anesthesia Physical  Anesthesia Plan  ASA: III  Anesthesia Plan: MAC   Post-op Pain Management:    Induction: Intravenous  PONV Risk Score and Plan: 2 and Ondansetron, Dexamethasone and Midazolam  Airway Management Planned: Nasal Cannula and Natural Airway  Additional Equipment: None  Intra-op Plan:   Post-operative Plan: Extubation in OR  Informed Consent: I have reviewed the patients History and Physical, chart, labs and discussed the procedure including the risks, benefits and alternatives for the proposed anesthesia with the patient or authorized representative who has indicated his/her understanding and acceptance.     Dental advisory given  Plan Discussed with: CRNA and Anesthesiologist  Anesthesia Plan Comments: ( Follows with cardiology for history of symptomatic sinus bradycardia status post PPM (recent generator change out for 06/01/19), CAD status post CABG 12/05/2018.  Last seen by general cardiology 01/25/2019, per note he was  overall doing well.  Noted that he had paroxysmal atrial fibrillation after CABG, however he was treated with amiodarone and Eliquis for about a month and both have been discontinued.  He was noted to be in sinus rhythm with atrial pacing.  Last seen by EP cardiology 03/31/2019, normal pacemaker function noted, AF burden <1%, noted to be nearing ERI (subsequently underwent generator change out 06/01/19).  CKD 4/5 not yet on HD followed at [facility] by Dr. Hollie Salk.  OSA on CPAP-noted to be compliant per cardiology notes.  IDDM 2, last A1c in epic 8.3 on 12/01/2018.  We will need day of surgery labs and eval.  Intraoperative TEE 12/05/2018: POST-OP IMPRESSIONS  - Left Ventricle: The left ventricle is unchanged from pre-bypass.  - Right Ventricle: The right ventricle appears unchanged from pre-bypass.  - Aorta: The aorta appears unchanged from pre-bypass.  - Left Atrium: The left atrium appears unchanged from pre-bypass.  - Left Atrial Appendage: The left atrial appendage appears unchanged from  pre-bypass.  - Aortic Valve: The aortic valve appears unchanged from pre-bypass.  - Mitral Valve: The mitral valve appears unchanged from pre-bypass.  - Tricuspid Valve: The tricuspid valve appears unchanged from pre-bypass.  - Interatrial Septum: The interatrial septum appears unchanged from  pre-bypass.  - Interventricular Septum: The interventricular septum appears unchanged  from  pre-bypass.  - Pericardium: The pericardium appears unchanged from pre-bypass.  TTE (pre-CABG) 12/01/2018: 1. Left ventricular ejection fraction, by visual estimation, is 60 to  65%. The left ventricle has normal function. Normal left ventricular size.  Left ventricular septal  wall thickness was mildly increased. Mildly  increased left ventricular posterior  wall thickness. There is mildly increased left ventricular hypertrophy.  2. Left ventricular diastolic Doppler parameters are consistent with  impaired  relaxation pattern of LV diastolic filling.  3. Global right ventricle has normal systolic function.The right  ventricular size is normal. No increase in right ventricular wall  thickness.  4. Left atrial size was mildly dilated.  5. Right atrial size was normal.  6. Trivial pericardial effusion is present.  7. The mitral valve is normal in structure. No evidence of mitral valve  regurgitation. No evidence of mitral stenosis.  8. The tricuspid valve is normal in structure. Tricuspid valve  regurgitation was not visualized by color flow Doppler.  9. The aortic valve is normal in structure. Aortic valve regurgitation  was not visualized by color flow Doppler. Structurally normal aortic  valve, with no evidence of sclerosis or stenosis.  10. The pulmonic valve was normal in structure. Pulmonic valve  regurgitation is not visualized by color flow Doppler.  11. TR signal is inadequate for assessing pulmonary artery systolic  pressure.  12. The inferior vena cava is normal in size with <50% respiratory  variability, suggesting right atrial pressure of 8 mmHg.    )       Anesthesia Quick Evaluation

## 2020-01-24 NOTE — Discharge Instructions (Signed)
° °  [facility] ° °Discharge Instructions ° °AV Fistula or Graft Surgery for Dialysis Access ° °Please refer to the following instructions for your post-procedure care. Your surgeon or physician assistant will discuss any changes with you. ° °Activity ° °You may drive the day following your surgery, if you are comfortable and no longer taking prescription pain medication. Resume full activity as the soreness in your incision resolves. ° °Bathing/Showering ° °You may shower after you go home. Keep your incision dry for 48 hours. Do not soak in a bathtub, hot tub, or swim until the incision heals completely. You may not shower if you have a hemodialysis catheter. ° °Incision Care ° °Clean your incision with mild soap and water after 48 hours. Pat the area dry with a clean towel. You do not need a bandage unless otherwise instructed. Do not apply any ointments or creams to your incision. You may have skin glue on your incision. Do not peel it off. It will come off on its own in about one week. Your arm may swell a bit after surgery. To reduce swelling use pillows to elevate your arm so it is above your heart. Your doctor will tell you if you need to lightly wrap your arm with an ACE bandage. ° °Diet ° °Resume your normal diet. There are not special food restrictions following this procedure. In order to heal from your surgery, it is CRITICAL to get adequate nutrition. Your body requires vitamins, minerals, and protein. Vegetables are the best source of vitamins and minerals. Vegetables also provide the perfect balance of protein. Processed food has little nutritional value, so try to avoid this. ° °Medications ° °Resume taking all of your medications. If your incision is causing pain, you may take over-the counter pain relievers such as acetaminophen (Tylenol). If you were prescribed a stronger pain medication, please be aware these medications can cause nausea and constipation. Prevent  nausea by taking the medication with a snack or meal. Avoid constipation by drinking plenty of fluids and eating foods with high amount of fiber, such as fruits, vegetables, and grains. Do not take Tylenol if you are taking prescription pain medications. ° ° ° ° °Follow up °Your surgeon may want to see you in the office following your access surgery. If so, this will be arranged at the time of your surgery. ° °Please call us immediately for any of the following conditions: ° °Increased pain, redness, drainage (pus) from your incision site °Fever of 101 degrees or higher °Severe or worsening pain at your incision site °Hand pain or numbness. ° °Reduce your risk of vascular disease: ° °Stop smoking. If you would like help, call [facility] at 1-800-QUIT-NOW (1-800-784-8669) or [facility] at [phone] ° °Manage your cholesterol °Maintain a desired weight °Control your diabetes °Keep your blood pressure down ° °Dialysis ° °It will take several weeks to several months for your new dialysis access to be ready for use. Your surgeon will determine when it is OK to use it. Your nephrologist will continue to direct your dialysis. You can continue to use your Permcath until your new access is ready for use. ° °If you have any questions, please call the office at [phone]. ° °

## 2020-01-24 NOTE — Anesthesia Postprocedure Evaluation (Signed)
Anesthesia Post Note  Patient: Bradley Soto  Procedure(s) Performed: BANDING OF RIGHT ARM ARTERIOVENOUS FISTULA (Right Arm Upper)     Patient location during evaluation: PACU Anesthesia Type: MAC Level of consciousness: awake and alert Pain management: pain level controlled Vital Signs Assessment: post-procedure vital signs reviewed and stable Respiratory status: spontaneous breathing, nonlabored ventilation, respiratory function stable and patient connected to nasal cannula oxygen Cardiovascular status: stable and blood pressure returned to baseline Postop Assessment: no apparent nausea or vomiting Anesthetic complications: no   No complications documented.  Last Vitals:  Vitals:   01/24/20 0940 01/24/20 0957  BP: (!) 143/63 139/68  Pulse: 72 72  Resp: 13 15  Temp:  36.4 C  SpO2: 96% 96%    Last Pain:  Vitals:   01/24/20 0957  TempSrc:   PainSc: 0-No pain                 Barnet Glasgow

## 2020-01-24 NOTE — Interval H&P Note (Signed)
History and Physical Interval Note:  01/24/2020 7:15 AM  Bradley Soto  has presented today for surgery, with the diagnosis of END STAGE RENAL DISEASE.  The various methods of treatment have been discussed with the patient and family. After consideration of risks, benefits and other options for treatment, the patient has consented to  Procedure(s): BANDING OF RIGHT ARM ARTERIOVENOUS FISTULA (Right) as a surgical intervention.  The patient's history has been reviewed, patient examined, no change in status, stable for surgery.  I have reviewed the patient's chart and labs.  Questions were answered to the patient's satisfaction.     Annamarie Major

## 2020-01-24 NOTE — Transfer of Care (Signed)
Immediate Anesthesia Transfer of Care Note  Patient: Bradley Soto  Procedure(s) Performed: Talmage Nap OF RIGHT ARM ARTERIOVENOUS FISTULA (Right Arm Upper)  Patient Location: PACU  Anesthesia Type:MAC  Level of Consciousness: awake, alert , oriented and patient cooperative  Airway & Oxygen Therapy: Patient Spontanous Breathing and Patient connected to face mask oxygen  Post-op Assessment: Report given to RN, Post -op Vital signs reviewed and stable and Patient moving all extremities  Post vital signs: Reviewed and stable  Last Vitals:  Vitals Value Taken Time  BP    Temp    Pulse 77 01/24/20 0924  Resp 14 01/24/20 0924  SpO2 96 % 01/24/20 0924  Vitals shown include unvalidated device data.  Last Pain:  Vitals:   01/24/20 0730  TempSrc:   PainSc: 0-No pain         Complications: No complications documented.

## 2020-01-25 ENCOUNTER — Encounter (HOSPITAL_COMMUNITY): Payer: Self-pay | Admitting: Surgery

## 2020-01-25 DIAGNOSIS — D509 Iron deficiency anemia, unspecified: Secondary | ICD-10-CM | POA: Diagnosis not present

## 2020-01-25 DIAGNOSIS — Z992 Dependence on renal dialysis: Secondary | ICD-10-CM | POA: Diagnosis not present

## 2020-01-25 DIAGNOSIS — E1122 Type 2 diabetes mellitus with diabetic chronic kidney disease: Secondary | ICD-10-CM | POA: Diagnosis not present

## 2020-01-25 DIAGNOSIS — D631 Anemia in chronic kidney disease: Secondary | ICD-10-CM | POA: Diagnosis not present

## 2020-01-25 DIAGNOSIS — N186 End stage renal disease: Secondary | ICD-10-CM | POA: Diagnosis not present

## 2020-01-25 DIAGNOSIS — N2581 Secondary hyperparathyroidism of renal origin: Secondary | ICD-10-CM | POA: Diagnosis not present

## 2020-01-27 DIAGNOSIS — D631 Anemia in chronic kidney disease: Secondary | ICD-10-CM | POA: Diagnosis not present

## 2020-01-27 DIAGNOSIS — E1122 Type 2 diabetes mellitus with diabetic chronic kidney disease: Secondary | ICD-10-CM | POA: Diagnosis not present

## 2020-01-27 DIAGNOSIS — N186 End stage renal disease: Secondary | ICD-10-CM | POA: Diagnosis not present

## 2020-01-27 DIAGNOSIS — D509 Iron deficiency anemia, unspecified: Secondary | ICD-10-CM | POA: Diagnosis not present

## 2020-01-27 DIAGNOSIS — N2581 Secondary hyperparathyroidism of renal origin: Secondary | ICD-10-CM | POA: Diagnosis not present

## 2020-01-27 DIAGNOSIS — Z992 Dependence on renal dialysis: Secondary | ICD-10-CM | POA: Diagnosis not present

## 2020-01-30 DIAGNOSIS — N186 End stage renal disease: Secondary | ICD-10-CM | POA: Diagnosis not present

## 2020-01-30 DIAGNOSIS — N2581 Secondary hyperparathyroidism of renal origin: Secondary | ICD-10-CM | POA: Diagnosis not present

## 2020-01-30 DIAGNOSIS — Z992 Dependence on renal dialysis: Secondary | ICD-10-CM | POA: Diagnosis not present

## 2020-01-30 DIAGNOSIS — D509 Iron deficiency anemia, unspecified: Secondary | ICD-10-CM | POA: Diagnosis not present

## 2020-01-30 DIAGNOSIS — D631 Anemia in chronic kidney disease: Secondary | ICD-10-CM | POA: Diagnosis not present

## 2020-01-30 DIAGNOSIS — E1122 Type 2 diabetes mellitus with diabetic chronic kidney disease: Secondary | ICD-10-CM | POA: Diagnosis not present

## 2020-02-01 DIAGNOSIS — N186 End stage renal disease: Secondary | ICD-10-CM | POA: Diagnosis not present

## 2020-02-01 DIAGNOSIS — D631 Anemia in chronic kidney disease: Secondary | ICD-10-CM | POA: Diagnosis not present

## 2020-02-01 DIAGNOSIS — D509 Iron deficiency anemia, unspecified: Secondary | ICD-10-CM | POA: Diagnosis not present

## 2020-02-01 DIAGNOSIS — N2581 Secondary hyperparathyroidism of renal origin: Secondary | ICD-10-CM | POA: Diagnosis not present

## 2020-02-01 DIAGNOSIS — E1122 Type 2 diabetes mellitus with diabetic chronic kidney disease: Secondary | ICD-10-CM | POA: Diagnosis not present

## 2020-02-01 DIAGNOSIS — Z992 Dependence on renal dialysis: Secondary | ICD-10-CM | POA: Diagnosis not present

## 2020-02-03 DIAGNOSIS — E1122 Type 2 diabetes mellitus with diabetic chronic kidney disease: Secondary | ICD-10-CM | POA: Diagnosis not present

## 2020-02-03 DIAGNOSIS — D631 Anemia in chronic kidney disease: Secondary | ICD-10-CM | POA: Diagnosis not present

## 2020-02-03 DIAGNOSIS — N186 End stage renal disease: Secondary | ICD-10-CM | POA: Diagnosis not present

## 2020-02-03 DIAGNOSIS — N2581 Secondary hyperparathyroidism of renal origin: Secondary | ICD-10-CM | POA: Diagnosis not present

## 2020-02-03 DIAGNOSIS — Z992 Dependence on renal dialysis: Secondary | ICD-10-CM | POA: Diagnosis not present

## 2020-02-03 DIAGNOSIS — D509 Iron deficiency anemia, unspecified: Secondary | ICD-10-CM | POA: Diagnosis not present

## 2020-02-06 DIAGNOSIS — E1122 Type 2 diabetes mellitus with diabetic chronic kidney disease: Secondary | ICD-10-CM | POA: Diagnosis not present

## 2020-02-06 DIAGNOSIS — D509 Iron deficiency anemia, unspecified: Secondary | ICD-10-CM | POA: Diagnosis not present

## 2020-02-06 DIAGNOSIS — N186 End stage renal disease: Secondary | ICD-10-CM | POA: Diagnosis not present

## 2020-02-06 DIAGNOSIS — Z992 Dependence on renal dialysis: Secondary | ICD-10-CM | POA: Diagnosis not present

## 2020-02-06 DIAGNOSIS — D631 Anemia in chronic kidney disease: Secondary | ICD-10-CM | POA: Diagnosis not present

## 2020-02-06 DIAGNOSIS — N2581 Secondary hyperparathyroidism of renal origin: Secondary | ICD-10-CM | POA: Diagnosis not present

## 2020-02-08 DIAGNOSIS — E1122 Type 2 diabetes mellitus with diabetic chronic kidney disease: Secondary | ICD-10-CM | POA: Diagnosis not present

## 2020-02-08 DIAGNOSIS — Z992 Dependence on renal dialysis: Secondary | ICD-10-CM | POA: Diagnosis not present

## 2020-02-08 DIAGNOSIS — D509 Iron deficiency anemia, unspecified: Secondary | ICD-10-CM | POA: Diagnosis not present

## 2020-02-08 DIAGNOSIS — D631 Anemia in chronic kidney disease: Secondary | ICD-10-CM | POA: Diagnosis not present

## 2020-02-08 DIAGNOSIS — N2581 Secondary hyperparathyroidism of renal origin: Secondary | ICD-10-CM | POA: Diagnosis not present

## 2020-02-08 DIAGNOSIS — N186 End stage renal disease: Secondary | ICD-10-CM | POA: Diagnosis not present

## 2020-02-11 DIAGNOSIS — D631 Anemia in chronic kidney disease: Secondary | ICD-10-CM | POA: Diagnosis not present

## 2020-02-11 DIAGNOSIS — Z992 Dependence on renal dialysis: Secondary | ICD-10-CM | POA: Diagnosis not present

## 2020-02-11 DIAGNOSIS — N186 End stage renal disease: Secondary | ICD-10-CM | POA: Diagnosis not present

## 2020-02-11 DIAGNOSIS — E1122 Type 2 diabetes mellitus with diabetic chronic kidney disease: Secondary | ICD-10-CM | POA: Diagnosis not present

## 2020-02-11 DIAGNOSIS — N2581 Secondary hyperparathyroidism of renal origin: Secondary | ICD-10-CM | POA: Diagnosis not present

## 2020-02-11 DIAGNOSIS — D509 Iron deficiency anemia, unspecified: Secondary | ICD-10-CM | POA: Diagnosis not present

## 2020-02-13 DIAGNOSIS — D509 Iron deficiency anemia, unspecified: Secondary | ICD-10-CM | POA: Diagnosis not present

## 2020-02-13 DIAGNOSIS — E1122 Type 2 diabetes mellitus with diabetic chronic kidney disease: Secondary | ICD-10-CM | POA: Diagnosis not present

## 2020-02-13 DIAGNOSIS — N186 End stage renal disease: Secondary | ICD-10-CM | POA: Diagnosis not present

## 2020-02-13 DIAGNOSIS — D631 Anemia in chronic kidney disease: Secondary | ICD-10-CM | POA: Diagnosis not present

## 2020-02-13 DIAGNOSIS — Z992 Dependence on renal dialysis: Secondary | ICD-10-CM | POA: Diagnosis not present

## 2020-02-13 DIAGNOSIS — N2581 Secondary hyperparathyroidism of renal origin: Secondary | ICD-10-CM | POA: Diagnosis not present

## 2020-02-14 DIAGNOSIS — E1169 Type 2 diabetes mellitus with other specified complication: Secondary | ICD-10-CM | POA: Diagnosis not present

## 2020-02-14 DIAGNOSIS — E785 Hyperlipidemia, unspecified: Secondary | ICD-10-CM | POA: Diagnosis not present

## 2020-02-14 DIAGNOSIS — M109 Gout, unspecified: Secondary | ICD-10-CM | POA: Diagnosis not present

## 2020-02-14 DIAGNOSIS — Z125 Encounter for screening for malignant neoplasm of prostate: Secondary | ICD-10-CM | POA: Diagnosis not present

## 2020-02-15 DIAGNOSIS — D631 Anemia in chronic kidney disease: Secondary | ICD-10-CM | POA: Diagnosis not present

## 2020-02-15 DIAGNOSIS — Z992 Dependence on renal dialysis: Secondary | ICD-10-CM | POA: Diagnosis not present

## 2020-02-15 DIAGNOSIS — E1122 Type 2 diabetes mellitus with diabetic chronic kidney disease: Secondary | ICD-10-CM | POA: Diagnosis not present

## 2020-02-15 DIAGNOSIS — D509 Iron deficiency anemia, unspecified: Secondary | ICD-10-CM | POA: Diagnosis not present

## 2020-02-15 DIAGNOSIS — N186 End stage renal disease: Secondary | ICD-10-CM | POA: Diagnosis not present

## 2020-02-15 DIAGNOSIS — N2581 Secondary hyperparathyroidism of renal origin: Secondary | ICD-10-CM | POA: Diagnosis not present

## 2020-02-17 DIAGNOSIS — Z992 Dependence on renal dialysis: Secondary | ICD-10-CM | POA: Diagnosis not present

## 2020-02-17 DIAGNOSIS — N186 End stage renal disease: Secondary | ICD-10-CM | POA: Diagnosis not present

## 2020-02-17 DIAGNOSIS — I129 Hypertensive chronic kidney disease with stage 1 through stage 4 chronic kidney disease, or unspecified chronic kidney disease: Secondary | ICD-10-CM | POA: Diagnosis not present

## 2020-02-18 DIAGNOSIS — N186 End stage renal disease: Secondary | ICD-10-CM | POA: Diagnosis not present

## 2020-02-18 DIAGNOSIS — D509 Iron deficiency anemia, unspecified: Secondary | ICD-10-CM | POA: Diagnosis not present

## 2020-02-18 DIAGNOSIS — D631 Anemia in chronic kidney disease: Secondary | ICD-10-CM | POA: Diagnosis not present

## 2020-02-18 DIAGNOSIS — Z992 Dependence on renal dialysis: Secondary | ICD-10-CM | POA: Diagnosis not present

## 2020-02-18 DIAGNOSIS — N2581 Secondary hyperparathyroidism of renal origin: Secondary | ICD-10-CM | POA: Diagnosis not present

## 2020-02-18 DIAGNOSIS — E1122 Type 2 diabetes mellitus with diabetic chronic kidney disease: Secondary | ICD-10-CM | POA: Diagnosis not present

## 2020-02-19 DIAGNOSIS — Z1212 Encounter for screening for malignant neoplasm of rectum: Secondary | ICD-10-CM | POA: Diagnosis not present

## 2020-02-19 DIAGNOSIS — Z992 Dependence on renal dialysis: Secondary | ICD-10-CM | POA: Diagnosis not present

## 2020-02-19 DIAGNOSIS — I120 Hypertensive chronic kidney disease with stage 5 chronic kidney disease or end stage renal disease: Secondary | ICD-10-CM | POA: Diagnosis not present

## 2020-02-19 DIAGNOSIS — N186 End stage renal disease: Secondary | ICD-10-CM | POA: Diagnosis not present

## 2020-02-19 DIAGNOSIS — E213 Hyperparathyroidism, unspecified: Secondary | ICD-10-CM | POA: Diagnosis not present

## 2020-02-19 DIAGNOSIS — Z0000 Encounter for general adult medical examination without abnormal findings: Secondary | ICD-10-CM | POA: Diagnosis not present

## 2020-02-19 DIAGNOSIS — I770 Arteriovenous fistula, acquired: Secondary | ICD-10-CM | POA: Diagnosis not present

## 2020-02-19 DIAGNOSIS — D6869 Other thrombophilia: Secondary | ICD-10-CM | POA: Diagnosis not present

## 2020-02-19 DIAGNOSIS — I4891 Unspecified atrial fibrillation: Secondary | ICD-10-CM | POA: Diagnosis not present

## 2020-02-19 DIAGNOSIS — R82998 Other abnormal findings in urine: Secondary | ICD-10-CM | POA: Diagnosis not present

## 2020-02-19 DIAGNOSIS — R7989 Other specified abnormal findings of blood chemistry: Secondary | ICD-10-CM | POA: Diagnosis not present

## 2020-02-19 DIAGNOSIS — E1169 Type 2 diabetes mellitus with other specified complication: Secondary | ICD-10-CM | POA: Diagnosis not present

## 2020-02-19 DIAGNOSIS — Z794 Long term (current) use of insulin: Secondary | ICD-10-CM | POA: Diagnosis not present

## 2020-02-19 DIAGNOSIS — I25810 Atherosclerosis of coronary artery bypass graft(s) without angina pectoris: Secondary | ICD-10-CM | POA: Diagnosis not present

## 2020-02-19 DIAGNOSIS — I5032 Chronic diastolic (congestive) heart failure: Secondary | ICD-10-CM | POA: Diagnosis not present

## 2020-02-20 DIAGNOSIS — E1122 Type 2 diabetes mellitus with diabetic chronic kidney disease: Secondary | ICD-10-CM | POA: Diagnosis not present

## 2020-02-20 DIAGNOSIS — N2581 Secondary hyperparathyroidism of renal origin: Secondary | ICD-10-CM | POA: Diagnosis not present

## 2020-02-20 DIAGNOSIS — D509 Iron deficiency anemia, unspecified: Secondary | ICD-10-CM | POA: Diagnosis not present

## 2020-02-20 DIAGNOSIS — D631 Anemia in chronic kidney disease: Secondary | ICD-10-CM | POA: Diagnosis not present

## 2020-02-20 DIAGNOSIS — N186 End stage renal disease: Secondary | ICD-10-CM | POA: Diagnosis not present

## 2020-02-20 DIAGNOSIS — Z992 Dependence on renal dialysis: Secondary | ICD-10-CM | POA: Diagnosis not present

## 2020-02-22 DIAGNOSIS — N186 End stage renal disease: Secondary | ICD-10-CM | POA: Diagnosis not present

## 2020-02-22 DIAGNOSIS — E1122 Type 2 diabetes mellitus with diabetic chronic kidney disease: Secondary | ICD-10-CM | POA: Diagnosis not present

## 2020-02-22 DIAGNOSIS — D509 Iron deficiency anemia, unspecified: Secondary | ICD-10-CM | POA: Diagnosis not present

## 2020-02-22 DIAGNOSIS — Z992 Dependence on renal dialysis: Secondary | ICD-10-CM | POA: Diagnosis not present

## 2020-02-22 DIAGNOSIS — N2581 Secondary hyperparathyroidism of renal origin: Secondary | ICD-10-CM | POA: Diagnosis not present

## 2020-02-22 DIAGNOSIS — D631 Anemia in chronic kidney disease: Secondary | ICD-10-CM | POA: Diagnosis not present

## 2020-02-24 DIAGNOSIS — D631 Anemia in chronic kidney disease: Secondary | ICD-10-CM | POA: Diagnosis not present

## 2020-02-24 DIAGNOSIS — N2581 Secondary hyperparathyroidism of renal origin: Secondary | ICD-10-CM | POA: Diagnosis not present

## 2020-02-24 DIAGNOSIS — N186 End stage renal disease: Secondary | ICD-10-CM | POA: Diagnosis not present

## 2020-02-24 DIAGNOSIS — D509 Iron deficiency anemia, unspecified: Secondary | ICD-10-CM | POA: Diagnosis not present

## 2020-02-24 DIAGNOSIS — Z992 Dependence on renal dialysis: Secondary | ICD-10-CM | POA: Diagnosis not present

## 2020-02-24 DIAGNOSIS — E1122 Type 2 diabetes mellitus with diabetic chronic kidney disease: Secondary | ICD-10-CM | POA: Diagnosis not present

## 2020-02-26 ENCOUNTER — Other Ambulatory Visit (HOSPITAL_COMMUNITY): Payer: Medicare Other

## 2020-02-26 ENCOUNTER — Ambulatory Visit: Payer: Medicare Other | Admitting: Surgery

## 2020-02-26 ENCOUNTER — Other Ambulatory Visit: Payer: Self-pay

## 2020-02-26 ENCOUNTER — Ambulatory Visit (INDEPENDENT_AMBULATORY_CARE_PROVIDER_SITE_OTHER): Payer: Self-pay | Admitting: Physician Assistant

## 2020-02-26 VITALS — BP 154/75 | HR 75 | Temp 98.7°F | Resp 18 | Ht 72.0 in | Wt 211.0 lb

## 2020-02-26 DIAGNOSIS — N186 End stage renal disease: Secondary | ICD-10-CM

## 2020-02-26 DIAGNOSIS — Z992 Dependence on renal dialysis: Secondary | ICD-10-CM

## 2020-02-26 NOTE — Progress Notes (Signed)
POST OPERATIVE DIALYSIS ACCESS OFFICE NOTE    CC:  F/u for dialysis access surgery  HPI:  This is a 80 y.o. male who is s/p banding of right arm brachiocephalic fistula 11/91/4782 by Dr. Trula Slade due to persistent right numbness on the lateral fingers as well as weakness and difficulty with fine motor skills.  The fistula was narrowed by approximately 50% with improved radial Doppler signal.  He initially states that his symptoms have not improved since banding, however as we discussed his ADLs he states his handwriting seems to be improved.  He is not complaining of significant clumsiness.  No hand pain.  He says when he is reclining if he brings both arms over his head his hand feels better.  Denies problems with dialysis.   Dialysis days: Tuesdays, Thursdays and Saturdays Dialysis center: [facility]  Past medical history significant for left pacemaker insertion.  Allergies  Allergen Reactions  . Shellfish Allergy Rash    Current Outpatient Medications  Medication Sig Dispense Refill  . allopurinol (ZYLOPRIM) 100 MG tablet Take 1 tablet (100 mg total) by mouth daily. 30 tablet 3  . aspirin EC 81 MG tablet Take 81 mg by mouth daily.    . B Complex-C-Zn-Folic Acid (DIALYVITE 956 WITH ZINC) 0.8 MG TABS Take 1 tablet by mouth at bedtime.    . Cholecalciferol (VITAMIN D-3) 25 MCG (1000 UT) CAPS Take 3,000 Units by mouth daily.     . diphenhydramine-acetaminophen (TYLENOL PM) 25-500 MG TABS tablet Take 0.5 tablets by mouth at bedtime as needed (sleep).     . hydrALAZINE (APRESOLINE) 25 MG tablet Take 1 tablet (25 mg total) by mouth 3 (three) times daily. (Patient not taking: Reported on 01/19/2020) 90 tablet 3  . HYDROcodone-acetaminophen (NORCO) 5-325 MG tablet Take 1 tablet by mouth every 6 (six) hours as needed for moderate pain. 10 tablet 0  . metoprolol tartrate (LOPRESSOR) 25 MG tablet Take 1.5 tablets (37.5 mg total) by mouth 2 (two) times daily. (Patient taking  differently: Take 25 mg by mouth every evening. ) 270 tablet 3  . NOVOLOG MIX 70/30 FLEXPEN (70-30) 100 UNIT/ML FlexPen Inject 35 Units into the skin daily.   3  . ondansetron (ZOFRAN) 4 MG tablet Take 1 tablet (4 mg total) by mouth every 6 (six) hours as needed for nausea. (Patient not taking: Reported on 01/19/2020) 30 tablet 1  . rosuvastatin (CRESTOR) 20 MG tablet TAKE 1 TABLET (20 MG TOTAL) BY MOUTH DAILY AT 6 PM. 90 tablet 3  . sevelamer carbonate (RENVELA) 800 MG tablet Take 1 tablet (800 mg total) by mouth 3 (three) times daily with meals. (Patient taking differently: Take 800-1,600 mg by mouth See admin instructions. Take 2 tablets (1600 mg) by mouth with each meal & take 1 tablet (800 mg) by mouth with a snack.) 90 tablet 3   No current facility-administered medications for this visit.     ROS:  See HPI  Vitals:   02/26/20 0912  BP: (!) 154/75  Pulse: 75  Resp: 18  Temp: 98.7 F (37.1 C)  SpO2: 98%    Physical Exam:  General appearance: WD, WN in NAD Cardiac:RRR Respiratory:nonlabored Incision:  Well healed Extremities:  Right  B-C fistula with good thrill and bruit.  He has 5/5 hand grip strength.  His right hand is very slightly cooler as compared to his left.  He has strong right radial, ulnar and palmar arch Doppler signals.  These do not improve significantly with compression  of his fistula.   Assessment/Plan: That is post right brachiocephalic fistula with banding in December secondary to lateral numbness.  He states fine motor skills have improved.  Fistula is functioning without apparent problems.  He inquires about further banding to further improve his symptoms, but I assured him at this stage, his hand was well perfused and further banding may compromise function of his fistula.  He would like to follow-up in 2 months.  I encouraged him to call the office sooner should he develop worsening hand symptoms.   Barbie Banner, PA-C 02/26/2020 9:14 AM [facility] [phone]  Clinic MD:  Trula Slade

## 2020-02-27 DIAGNOSIS — Z992 Dependence on renal dialysis: Secondary | ICD-10-CM | POA: Diagnosis not present

## 2020-02-27 DIAGNOSIS — E1122 Type 2 diabetes mellitus with diabetic chronic kidney disease: Secondary | ICD-10-CM | POA: Diagnosis not present

## 2020-02-27 DIAGNOSIS — N2581 Secondary hyperparathyroidism of renal origin: Secondary | ICD-10-CM | POA: Diagnosis not present

## 2020-02-27 DIAGNOSIS — D631 Anemia in chronic kidney disease: Secondary | ICD-10-CM | POA: Diagnosis not present

## 2020-02-27 DIAGNOSIS — N186 End stage renal disease: Secondary | ICD-10-CM | POA: Diagnosis not present

## 2020-02-27 DIAGNOSIS — D509 Iron deficiency anemia, unspecified: Secondary | ICD-10-CM | POA: Diagnosis not present

## 2020-02-29 DIAGNOSIS — E1122 Type 2 diabetes mellitus with diabetic chronic kidney disease: Secondary | ICD-10-CM | POA: Diagnosis not present

## 2020-02-29 DIAGNOSIS — D509 Iron deficiency anemia, unspecified: Secondary | ICD-10-CM | POA: Diagnosis not present

## 2020-02-29 DIAGNOSIS — D631 Anemia in chronic kidney disease: Secondary | ICD-10-CM | POA: Diagnosis not present

## 2020-02-29 DIAGNOSIS — Z992 Dependence on renal dialysis: Secondary | ICD-10-CM | POA: Diagnosis not present

## 2020-02-29 DIAGNOSIS — N186 End stage renal disease: Secondary | ICD-10-CM | POA: Diagnosis not present

## 2020-02-29 DIAGNOSIS — N2581 Secondary hyperparathyroidism of renal origin: Secondary | ICD-10-CM | POA: Diagnosis not present

## 2020-03-01 ENCOUNTER — Ambulatory Visit (INDEPENDENT_AMBULATORY_CARE_PROVIDER_SITE_OTHER): Payer: Medicare Other

## 2020-03-01 DIAGNOSIS — I495 Sick sinus syndrome: Secondary | ICD-10-CM | POA: Diagnosis not present

## 2020-03-02 DIAGNOSIS — D509 Iron deficiency anemia, unspecified: Secondary | ICD-10-CM | POA: Diagnosis not present

## 2020-03-02 DIAGNOSIS — N2581 Secondary hyperparathyroidism of renal origin: Secondary | ICD-10-CM | POA: Diagnosis not present

## 2020-03-02 DIAGNOSIS — N186 End stage renal disease: Secondary | ICD-10-CM | POA: Diagnosis not present

## 2020-03-02 DIAGNOSIS — E1122 Type 2 diabetes mellitus with diabetic chronic kidney disease: Secondary | ICD-10-CM | POA: Diagnosis not present

## 2020-03-02 DIAGNOSIS — Z992 Dependence on renal dialysis: Secondary | ICD-10-CM | POA: Diagnosis not present

## 2020-03-02 DIAGNOSIS — D631 Anemia in chronic kidney disease: Secondary | ICD-10-CM | POA: Diagnosis not present

## 2020-03-04 LAB — CUP PACEART REMOTE DEVICE CHECK
Battery Remaining Longevity: 124 mo
Battery Voltage: 3.12 V
Brady Statistic AP VP Percent: 0.01 %
Brady Statistic AP VS Percent: 24.26 %
Brady Statistic AS VP Percent: 0.03 %
Brady Statistic AS VS Percent: 75.7 %
Brady Statistic RA Percent Paced: 24.29 %
Brady Statistic RV Percent Paced: 0.04 %
Date Time Interrogation Session: 20220113195546
Implantable Lead Implant Date: 20020325
Implantable Lead Implant Date: 20020325
Implantable Lead Location: 753859
Implantable Lead Location: 753860
Implantable Lead Model: 4092
Implantable Lead Model: 4524
Implantable Pulse Generator Implant Date: 20210415
Lead Channel Impedance Value: 247 Ohm
Lead Channel Impedance Value: 285 Ohm
Lead Channel Impedance Value: 456 Ohm
Lead Channel Impedance Value: 532 Ohm
Lead Channel Pacing Threshold Amplitude: 0.625 V
Lead Channel Pacing Threshold Amplitude: 2.5 V
Lead Channel Pacing Threshold Pulse Width: 0.4 ms
Lead Channel Pacing Threshold Pulse Width: 0.4 ms
Lead Channel Sensing Intrinsic Amplitude: 0.75 mV
Lead Channel Sensing Intrinsic Amplitude: 0.75 mV
Lead Channel Sensing Intrinsic Amplitude: 20 mV
Lead Channel Sensing Intrinsic Amplitude: 20 mV
Lead Channel Setting Pacing Amplitude: 2.5 V
Lead Channel Setting Pacing Amplitude: 3 V
Lead Channel Setting Pacing Pulse Width: 0.4 ms
Lead Channel Setting Sensing Sensitivity: 1.2 mV

## 2020-03-05 DIAGNOSIS — E1122 Type 2 diabetes mellitus with diabetic chronic kidney disease: Secondary | ICD-10-CM | POA: Diagnosis not present

## 2020-03-05 DIAGNOSIS — Z992 Dependence on renal dialysis: Secondary | ICD-10-CM | POA: Diagnosis not present

## 2020-03-05 DIAGNOSIS — D509 Iron deficiency anemia, unspecified: Secondary | ICD-10-CM | POA: Diagnosis not present

## 2020-03-05 DIAGNOSIS — D631 Anemia in chronic kidney disease: Secondary | ICD-10-CM | POA: Diagnosis not present

## 2020-03-05 DIAGNOSIS — N2581 Secondary hyperparathyroidism of renal origin: Secondary | ICD-10-CM | POA: Diagnosis not present

## 2020-03-05 DIAGNOSIS — N186 End stage renal disease: Secondary | ICD-10-CM | POA: Diagnosis not present

## 2020-03-07 DIAGNOSIS — D509 Iron deficiency anemia, unspecified: Secondary | ICD-10-CM | POA: Diagnosis not present

## 2020-03-07 DIAGNOSIS — N186 End stage renal disease: Secondary | ICD-10-CM | POA: Diagnosis not present

## 2020-03-07 DIAGNOSIS — E039 Hypothyroidism, unspecified: Secondary | ICD-10-CM | POA: Diagnosis not present

## 2020-03-07 DIAGNOSIS — Z992 Dependence on renal dialysis: Secondary | ICD-10-CM | POA: Diagnosis not present

## 2020-03-07 DIAGNOSIS — E1122 Type 2 diabetes mellitus with diabetic chronic kidney disease: Secondary | ICD-10-CM | POA: Diagnosis not present

## 2020-03-07 DIAGNOSIS — N2581 Secondary hyperparathyroidism of renal origin: Secondary | ICD-10-CM | POA: Diagnosis not present

## 2020-03-07 DIAGNOSIS — D631 Anemia in chronic kidney disease: Secondary | ICD-10-CM | POA: Diagnosis not present

## 2020-03-09 DIAGNOSIS — D509 Iron deficiency anemia, unspecified: Secondary | ICD-10-CM | POA: Diagnosis not present

## 2020-03-09 DIAGNOSIS — N2581 Secondary hyperparathyroidism of renal origin: Secondary | ICD-10-CM | POA: Diagnosis not present

## 2020-03-09 DIAGNOSIS — E1122 Type 2 diabetes mellitus with diabetic chronic kidney disease: Secondary | ICD-10-CM | POA: Diagnosis not present

## 2020-03-09 DIAGNOSIS — N186 End stage renal disease: Secondary | ICD-10-CM | POA: Diagnosis not present

## 2020-03-09 DIAGNOSIS — D631 Anemia in chronic kidney disease: Secondary | ICD-10-CM | POA: Diagnosis not present

## 2020-03-09 DIAGNOSIS — Z992 Dependence on renal dialysis: Secondary | ICD-10-CM | POA: Diagnosis not present

## 2020-03-12 DIAGNOSIS — N2581 Secondary hyperparathyroidism of renal origin: Secondary | ICD-10-CM | POA: Diagnosis not present

## 2020-03-12 DIAGNOSIS — E1122 Type 2 diabetes mellitus with diabetic chronic kidney disease: Secondary | ICD-10-CM | POA: Diagnosis not present

## 2020-03-12 DIAGNOSIS — D509 Iron deficiency anemia, unspecified: Secondary | ICD-10-CM | POA: Diagnosis not present

## 2020-03-12 DIAGNOSIS — D631 Anemia in chronic kidney disease: Secondary | ICD-10-CM | POA: Diagnosis not present

## 2020-03-12 DIAGNOSIS — Z992 Dependence on renal dialysis: Secondary | ICD-10-CM | POA: Diagnosis not present

## 2020-03-12 DIAGNOSIS — N186 End stage renal disease: Secondary | ICD-10-CM | POA: Diagnosis not present

## 2020-03-14 DIAGNOSIS — D509 Iron deficiency anemia, unspecified: Secondary | ICD-10-CM | POA: Diagnosis not present

## 2020-03-14 DIAGNOSIS — Z992 Dependence on renal dialysis: Secondary | ICD-10-CM | POA: Diagnosis not present

## 2020-03-14 DIAGNOSIS — N2581 Secondary hyperparathyroidism of renal origin: Secondary | ICD-10-CM | POA: Diagnosis not present

## 2020-03-14 DIAGNOSIS — N186 End stage renal disease: Secondary | ICD-10-CM | POA: Diagnosis not present

## 2020-03-14 DIAGNOSIS — E1122 Type 2 diabetes mellitus with diabetic chronic kidney disease: Secondary | ICD-10-CM | POA: Diagnosis not present

## 2020-03-14 DIAGNOSIS — D631 Anemia in chronic kidney disease: Secondary | ICD-10-CM | POA: Diagnosis not present

## 2020-03-16 DIAGNOSIS — D631 Anemia in chronic kidney disease: Secondary | ICD-10-CM | POA: Diagnosis not present

## 2020-03-16 DIAGNOSIS — N186 End stage renal disease: Secondary | ICD-10-CM | POA: Diagnosis not present

## 2020-03-16 DIAGNOSIS — D509 Iron deficiency anemia, unspecified: Secondary | ICD-10-CM | POA: Diagnosis not present

## 2020-03-16 DIAGNOSIS — E1122 Type 2 diabetes mellitus with diabetic chronic kidney disease: Secondary | ICD-10-CM | POA: Diagnosis not present

## 2020-03-16 DIAGNOSIS — Z992 Dependence on renal dialysis: Secondary | ICD-10-CM | POA: Diagnosis not present

## 2020-03-16 DIAGNOSIS — N2581 Secondary hyperparathyroidism of renal origin: Secondary | ICD-10-CM | POA: Diagnosis not present

## 2020-03-16 NOTE — Progress Notes (Signed)
Remote pacemaker transmission.   

## 2020-03-18 DIAGNOSIS — E1169 Type 2 diabetes mellitus with other specified complication: Secondary | ICD-10-CM | POA: Diagnosis not present

## 2020-03-18 DIAGNOSIS — I130 Hypertensive heart and chronic kidney disease with heart failure and stage 1 through stage 4 chronic kidney disease, or unspecified chronic kidney disease: Secondary | ICD-10-CM | POA: Diagnosis not present

## 2020-03-18 DIAGNOSIS — I5032 Chronic diastolic (congestive) heart failure: Secondary | ICD-10-CM | POA: Diagnosis not present

## 2020-03-18 DIAGNOSIS — R7989 Other specified abnormal findings of blood chemistry: Secondary | ICD-10-CM | POA: Diagnosis not present

## 2020-03-18 DIAGNOSIS — Z794 Long term (current) use of insulin: Secondary | ICD-10-CM | POA: Diagnosis not present

## 2020-03-18 DIAGNOSIS — I4891 Unspecified atrial fibrillation: Secondary | ICD-10-CM | POA: Diagnosis not present

## 2020-03-18 DIAGNOSIS — N186 End stage renal disease: Secondary | ICD-10-CM | POA: Diagnosis not present

## 2020-03-18 DIAGNOSIS — D6869 Other thrombophilia: Secondary | ICD-10-CM | POA: Diagnosis not present

## 2020-03-19 DIAGNOSIS — N186 End stage renal disease: Secondary | ICD-10-CM | POA: Diagnosis not present

## 2020-03-19 DIAGNOSIS — D631 Anemia in chronic kidney disease: Secondary | ICD-10-CM | POA: Diagnosis not present

## 2020-03-19 DIAGNOSIS — N2581 Secondary hyperparathyroidism of renal origin: Secondary | ICD-10-CM | POA: Diagnosis not present

## 2020-03-19 DIAGNOSIS — E1122 Type 2 diabetes mellitus with diabetic chronic kidney disease: Secondary | ICD-10-CM | POA: Diagnosis not present

## 2020-03-19 DIAGNOSIS — Z992 Dependence on renal dialysis: Secondary | ICD-10-CM | POA: Diagnosis not present

## 2020-03-19 DIAGNOSIS — I129 Hypertensive chronic kidney disease with stage 1 through stage 4 chronic kidney disease, or unspecified chronic kidney disease: Secondary | ICD-10-CM | POA: Diagnosis not present

## 2020-03-19 DIAGNOSIS — D509 Iron deficiency anemia, unspecified: Secondary | ICD-10-CM | POA: Diagnosis not present

## 2020-03-19 DIAGNOSIS — Z23 Encounter for immunization: Secondary | ICD-10-CM | POA: Diagnosis not present

## 2020-03-21 DIAGNOSIS — E1122 Type 2 diabetes mellitus with diabetic chronic kidney disease: Secondary | ICD-10-CM | POA: Diagnosis not present

## 2020-03-21 DIAGNOSIS — Z992 Dependence on renal dialysis: Secondary | ICD-10-CM | POA: Diagnosis not present

## 2020-03-21 DIAGNOSIS — N2581 Secondary hyperparathyroidism of renal origin: Secondary | ICD-10-CM | POA: Diagnosis not present

## 2020-03-21 DIAGNOSIS — N186 End stage renal disease: Secondary | ICD-10-CM | POA: Diagnosis not present

## 2020-03-21 DIAGNOSIS — D509 Iron deficiency anemia, unspecified: Secondary | ICD-10-CM | POA: Diagnosis not present

## 2020-03-21 DIAGNOSIS — D631 Anemia in chronic kidney disease: Secondary | ICD-10-CM | POA: Diagnosis not present

## 2020-03-23 DIAGNOSIS — N186 End stage renal disease: Secondary | ICD-10-CM | POA: Diagnosis not present

## 2020-03-23 DIAGNOSIS — D509 Iron deficiency anemia, unspecified: Secondary | ICD-10-CM | POA: Diagnosis not present

## 2020-03-23 DIAGNOSIS — E1122 Type 2 diabetes mellitus with diabetic chronic kidney disease: Secondary | ICD-10-CM | POA: Diagnosis not present

## 2020-03-23 DIAGNOSIS — Z992 Dependence on renal dialysis: Secondary | ICD-10-CM | POA: Diagnosis not present

## 2020-03-23 DIAGNOSIS — D631 Anemia in chronic kidney disease: Secondary | ICD-10-CM | POA: Diagnosis not present

## 2020-03-23 DIAGNOSIS — N2581 Secondary hyperparathyroidism of renal origin: Secondary | ICD-10-CM | POA: Diagnosis not present

## 2020-03-26 DIAGNOSIS — N2581 Secondary hyperparathyroidism of renal origin: Secondary | ICD-10-CM | POA: Diagnosis not present

## 2020-03-26 DIAGNOSIS — D631 Anemia in chronic kidney disease: Secondary | ICD-10-CM | POA: Diagnosis not present

## 2020-03-26 DIAGNOSIS — D509 Iron deficiency anemia, unspecified: Secondary | ICD-10-CM | POA: Diagnosis not present

## 2020-03-26 DIAGNOSIS — Z992 Dependence on renal dialysis: Secondary | ICD-10-CM | POA: Diagnosis not present

## 2020-03-26 DIAGNOSIS — N186 End stage renal disease: Secondary | ICD-10-CM | POA: Diagnosis not present

## 2020-03-26 DIAGNOSIS — E1122 Type 2 diabetes mellitus with diabetic chronic kidney disease: Secondary | ICD-10-CM | POA: Diagnosis not present

## 2020-03-28 DIAGNOSIS — N2581 Secondary hyperparathyroidism of renal origin: Secondary | ICD-10-CM | POA: Diagnosis not present

## 2020-03-28 DIAGNOSIS — N186 End stage renal disease: Secondary | ICD-10-CM | POA: Diagnosis not present

## 2020-03-28 DIAGNOSIS — E1122 Type 2 diabetes mellitus with diabetic chronic kidney disease: Secondary | ICD-10-CM | POA: Diagnosis not present

## 2020-03-28 DIAGNOSIS — D631 Anemia in chronic kidney disease: Secondary | ICD-10-CM | POA: Diagnosis not present

## 2020-03-28 DIAGNOSIS — D509 Iron deficiency anemia, unspecified: Secondary | ICD-10-CM | POA: Diagnosis not present

## 2020-03-28 DIAGNOSIS — Z992 Dependence on renal dialysis: Secondary | ICD-10-CM | POA: Diagnosis not present

## 2020-03-30 DIAGNOSIS — E1122 Type 2 diabetes mellitus with diabetic chronic kidney disease: Secondary | ICD-10-CM | POA: Diagnosis not present

## 2020-03-30 DIAGNOSIS — N186 End stage renal disease: Secondary | ICD-10-CM | POA: Diagnosis not present

## 2020-03-30 DIAGNOSIS — N2581 Secondary hyperparathyroidism of renal origin: Secondary | ICD-10-CM | POA: Diagnosis not present

## 2020-03-30 DIAGNOSIS — D631 Anemia in chronic kidney disease: Secondary | ICD-10-CM | POA: Diagnosis not present

## 2020-03-30 DIAGNOSIS — D509 Iron deficiency anemia, unspecified: Secondary | ICD-10-CM | POA: Diagnosis not present

## 2020-03-30 DIAGNOSIS — Z992 Dependence on renal dialysis: Secondary | ICD-10-CM | POA: Diagnosis not present

## 2020-04-02 DIAGNOSIS — D509 Iron deficiency anemia, unspecified: Secondary | ICD-10-CM | POA: Diagnosis not present

## 2020-04-02 DIAGNOSIS — E1122 Type 2 diabetes mellitus with diabetic chronic kidney disease: Secondary | ICD-10-CM | POA: Diagnosis not present

## 2020-04-02 DIAGNOSIS — Z992 Dependence on renal dialysis: Secondary | ICD-10-CM | POA: Diagnosis not present

## 2020-04-02 DIAGNOSIS — N186 End stage renal disease: Secondary | ICD-10-CM | POA: Diagnosis not present

## 2020-04-02 DIAGNOSIS — D631 Anemia in chronic kidney disease: Secondary | ICD-10-CM | POA: Diagnosis not present

## 2020-04-02 DIAGNOSIS — N2581 Secondary hyperparathyroidism of renal origin: Secondary | ICD-10-CM | POA: Diagnosis not present

## 2020-04-04 DIAGNOSIS — D631 Anemia in chronic kidney disease: Secondary | ICD-10-CM | POA: Diagnosis not present

## 2020-04-04 DIAGNOSIS — D509 Iron deficiency anemia, unspecified: Secondary | ICD-10-CM | POA: Diagnosis not present

## 2020-04-04 DIAGNOSIS — N2581 Secondary hyperparathyroidism of renal origin: Secondary | ICD-10-CM | POA: Diagnosis not present

## 2020-04-04 DIAGNOSIS — Z992 Dependence on renal dialysis: Secondary | ICD-10-CM | POA: Diagnosis not present

## 2020-04-04 DIAGNOSIS — N186 End stage renal disease: Secondary | ICD-10-CM | POA: Diagnosis not present

## 2020-04-04 DIAGNOSIS — E1122 Type 2 diabetes mellitus with diabetic chronic kidney disease: Secondary | ICD-10-CM | POA: Diagnosis not present

## 2020-04-06 DIAGNOSIS — E1122 Type 2 diabetes mellitus with diabetic chronic kidney disease: Secondary | ICD-10-CM | POA: Diagnosis not present

## 2020-04-06 DIAGNOSIS — D631 Anemia in chronic kidney disease: Secondary | ICD-10-CM | POA: Diagnosis not present

## 2020-04-06 DIAGNOSIS — Z992 Dependence on renal dialysis: Secondary | ICD-10-CM | POA: Diagnosis not present

## 2020-04-06 DIAGNOSIS — N186 End stage renal disease: Secondary | ICD-10-CM | POA: Diagnosis not present

## 2020-04-06 DIAGNOSIS — D509 Iron deficiency anemia, unspecified: Secondary | ICD-10-CM | POA: Diagnosis not present

## 2020-04-06 DIAGNOSIS — N2581 Secondary hyperparathyroidism of renal origin: Secondary | ICD-10-CM | POA: Diagnosis not present

## 2020-04-09 DIAGNOSIS — N2581 Secondary hyperparathyroidism of renal origin: Secondary | ICD-10-CM | POA: Diagnosis not present

## 2020-04-09 DIAGNOSIS — E1122 Type 2 diabetes mellitus with diabetic chronic kidney disease: Secondary | ICD-10-CM | POA: Diagnosis not present

## 2020-04-09 DIAGNOSIS — Z992 Dependence on renal dialysis: Secondary | ICD-10-CM | POA: Diagnosis not present

## 2020-04-09 DIAGNOSIS — D509 Iron deficiency anemia, unspecified: Secondary | ICD-10-CM | POA: Diagnosis not present

## 2020-04-09 DIAGNOSIS — N186 End stage renal disease: Secondary | ICD-10-CM | POA: Diagnosis not present

## 2020-04-09 DIAGNOSIS — D631 Anemia in chronic kidney disease: Secondary | ICD-10-CM | POA: Diagnosis not present

## 2020-04-11 DIAGNOSIS — N186 End stage renal disease: Secondary | ICD-10-CM | POA: Diagnosis not present

## 2020-04-11 DIAGNOSIS — N2581 Secondary hyperparathyroidism of renal origin: Secondary | ICD-10-CM | POA: Diagnosis not present

## 2020-04-11 DIAGNOSIS — E1122 Type 2 diabetes mellitus with diabetic chronic kidney disease: Secondary | ICD-10-CM | POA: Diagnosis not present

## 2020-04-11 DIAGNOSIS — D631 Anemia in chronic kidney disease: Secondary | ICD-10-CM | POA: Diagnosis not present

## 2020-04-11 DIAGNOSIS — Z992 Dependence on renal dialysis: Secondary | ICD-10-CM | POA: Diagnosis not present

## 2020-04-11 DIAGNOSIS — D509 Iron deficiency anemia, unspecified: Secondary | ICD-10-CM | POA: Diagnosis not present

## 2020-04-13 DIAGNOSIS — N186 End stage renal disease: Secondary | ICD-10-CM | POA: Diagnosis not present

## 2020-04-13 DIAGNOSIS — Z992 Dependence on renal dialysis: Secondary | ICD-10-CM | POA: Diagnosis not present

## 2020-04-13 DIAGNOSIS — N2581 Secondary hyperparathyroidism of renal origin: Secondary | ICD-10-CM | POA: Diagnosis not present

## 2020-04-13 DIAGNOSIS — D631 Anemia in chronic kidney disease: Secondary | ICD-10-CM | POA: Diagnosis not present

## 2020-04-13 DIAGNOSIS — E1122 Type 2 diabetes mellitus with diabetic chronic kidney disease: Secondary | ICD-10-CM | POA: Diagnosis not present

## 2020-04-13 DIAGNOSIS — D509 Iron deficiency anemia, unspecified: Secondary | ICD-10-CM | POA: Diagnosis not present

## 2020-04-16 DIAGNOSIS — N186 End stage renal disease: Secondary | ICD-10-CM | POA: Diagnosis not present

## 2020-04-16 DIAGNOSIS — D631 Anemia in chronic kidney disease: Secondary | ICD-10-CM | POA: Diagnosis not present

## 2020-04-16 DIAGNOSIS — N2581 Secondary hyperparathyroidism of renal origin: Secondary | ICD-10-CM | POA: Diagnosis not present

## 2020-04-16 DIAGNOSIS — Z992 Dependence on renal dialysis: Secondary | ICD-10-CM | POA: Diagnosis not present

## 2020-04-16 DIAGNOSIS — E1122 Type 2 diabetes mellitus with diabetic chronic kidney disease: Secondary | ICD-10-CM | POA: Diagnosis not present

## 2020-04-16 DIAGNOSIS — I129 Hypertensive chronic kidney disease with stage 1 through stage 4 chronic kidney disease, or unspecified chronic kidney disease: Secondary | ICD-10-CM | POA: Diagnosis not present

## 2020-04-18 DIAGNOSIS — E1122 Type 2 diabetes mellitus with diabetic chronic kidney disease: Secondary | ICD-10-CM | POA: Diagnosis not present

## 2020-04-18 DIAGNOSIS — N2581 Secondary hyperparathyroidism of renal origin: Secondary | ICD-10-CM | POA: Diagnosis not present

## 2020-04-18 DIAGNOSIS — Z992 Dependence on renal dialysis: Secondary | ICD-10-CM | POA: Diagnosis not present

## 2020-04-18 DIAGNOSIS — D631 Anemia in chronic kidney disease: Secondary | ICD-10-CM | POA: Diagnosis not present

## 2020-04-18 DIAGNOSIS — N186 End stage renal disease: Secondary | ICD-10-CM | POA: Diagnosis not present

## 2020-04-20 DIAGNOSIS — D631 Anemia in chronic kidney disease: Secondary | ICD-10-CM | POA: Diagnosis not present

## 2020-04-20 DIAGNOSIS — Z992 Dependence on renal dialysis: Secondary | ICD-10-CM | POA: Diagnosis not present

## 2020-04-20 DIAGNOSIS — E1122 Type 2 diabetes mellitus with diabetic chronic kidney disease: Secondary | ICD-10-CM | POA: Diagnosis not present

## 2020-04-20 DIAGNOSIS — N2581 Secondary hyperparathyroidism of renal origin: Secondary | ICD-10-CM | POA: Diagnosis not present

## 2020-04-20 DIAGNOSIS — N186 End stage renal disease: Secondary | ICD-10-CM | POA: Diagnosis not present

## 2020-04-22 ENCOUNTER — Ambulatory Visit: Payer: Medicare Other

## 2020-04-23 DIAGNOSIS — D631 Anemia in chronic kidney disease: Secondary | ICD-10-CM | POA: Diagnosis not present

## 2020-04-23 DIAGNOSIS — Z992 Dependence on renal dialysis: Secondary | ICD-10-CM | POA: Diagnosis not present

## 2020-04-23 DIAGNOSIS — N186 End stage renal disease: Secondary | ICD-10-CM | POA: Diagnosis not present

## 2020-04-23 DIAGNOSIS — N2581 Secondary hyperparathyroidism of renal origin: Secondary | ICD-10-CM | POA: Diagnosis not present

## 2020-04-23 DIAGNOSIS — E1122 Type 2 diabetes mellitus with diabetic chronic kidney disease: Secondary | ICD-10-CM | POA: Diagnosis not present

## 2020-04-25 DIAGNOSIS — N186 End stage renal disease: Secondary | ICD-10-CM | POA: Diagnosis not present

## 2020-04-25 DIAGNOSIS — Z992 Dependence on renal dialysis: Secondary | ICD-10-CM | POA: Diagnosis not present

## 2020-04-25 DIAGNOSIS — D631 Anemia in chronic kidney disease: Secondary | ICD-10-CM | POA: Diagnosis not present

## 2020-04-25 DIAGNOSIS — E1122 Type 2 diabetes mellitus with diabetic chronic kidney disease: Secondary | ICD-10-CM | POA: Diagnosis not present

## 2020-04-25 DIAGNOSIS — N2581 Secondary hyperparathyroidism of renal origin: Secondary | ICD-10-CM | POA: Diagnosis not present

## 2020-04-27 DIAGNOSIS — E1122 Type 2 diabetes mellitus with diabetic chronic kidney disease: Secondary | ICD-10-CM | POA: Diagnosis not present

## 2020-04-27 DIAGNOSIS — D631 Anemia in chronic kidney disease: Secondary | ICD-10-CM | POA: Diagnosis not present

## 2020-04-27 DIAGNOSIS — N2581 Secondary hyperparathyroidism of renal origin: Secondary | ICD-10-CM | POA: Diagnosis not present

## 2020-04-27 DIAGNOSIS — Z992 Dependence on renal dialysis: Secondary | ICD-10-CM | POA: Diagnosis not present

## 2020-04-27 DIAGNOSIS — N186 End stage renal disease: Secondary | ICD-10-CM | POA: Diagnosis not present

## 2020-04-29 ENCOUNTER — Encounter: Payer: Self-pay | Admitting: Physician Assistant

## 2020-04-29 ENCOUNTER — Ambulatory Visit (INDEPENDENT_AMBULATORY_CARE_PROVIDER_SITE_OTHER): Payer: Medicare Other | Admitting: Physician Assistant

## 2020-04-29 ENCOUNTER — Other Ambulatory Visit: Payer: Self-pay

## 2020-04-29 VITALS — BP 121/70 | HR 80 | Temp 98.3°F | Resp 20 | Ht 72.0 in | Wt 215.0 lb

## 2020-04-29 DIAGNOSIS — Z992 Dependence on renal dialysis: Secondary | ICD-10-CM

## 2020-04-29 DIAGNOSIS — N186 End stage renal disease: Secondary | ICD-10-CM

## 2020-04-29 NOTE — Progress Notes (Signed)
    Postoperative Access Visit   History of Present Illness   Bradley Soto is a 80 y.o. year old male who presents for postoperative follow-up for banding of right arm brachiocephalic AV fistula by Dr. Trula Slade on 01/24/20.  The patient's wounds are healed.  The patient notes continued steal symptoms. He has persistent numbness of the right 5th finger and medial aspect of 4th finger. He says he has normal grip strength and motor but fine motor tasks such as picking up small objects or writing are very hard. He also says because finger is numb he will catch it on objects and not notice. He does have some coolness in right hand vs left but not bothersome.  He very rarely has pain in right hand. He reports no issues with the fistula function. No bleeding, swelling, or cannulation issues.   Dialyzes Tues/ Thurs/ Sat at [facility] in [place]  Physical Examination   Vitals:   04/29/20 1003  BP: 121/70  Pulse: 80  Resp: 20  Temp: 98.3 F (36.8 C)  TempSrc: Temporal  SpO2: 96%  Weight: 215 lb (97.5 kg)  Height: 6' (1.829 m)   Body mass index is 29.16 kg/m.  right arm Incision is  healed, not radial pulse, hand grip is 5/5, sensation in digits is intact except 5th finger of right hand, palpable thrill, bruit can be auscultated     Medical Decision Making   Bradley Soto is a 80 y.o. year old male who presents s/p banding of right arm brachiocephalic AV fistula by Dr. Trula Slade on 01/24/20. He has continued numbness in right 4th and 5th fingers. Some improvement overall since banding but says that the numbness in the 5th finger in particular is somewhat worse. No pain. Fistula is functioning without apparent problems.  He inquires about further banding to further improve his symptoms, but I assured him at this stage, his hand was well perfused and further banding may compromise function of his fistula. Discussed should his symptoms worsen or if he deems them intolerable he would probably need  to have this fistula ligated and have a new access placed. At this time he says it is tolerable.   He will follow up again in 2-3 months  Advised him to follow up sooner if he has worsening/ intolerable steal symptoms   Karoline Caldwell, PA-C [facility] Office: [phone]  Clinic MD: Dr. Trula Slade

## 2020-04-30 DIAGNOSIS — D631 Anemia in chronic kidney disease: Secondary | ICD-10-CM | POA: Diagnosis not present

## 2020-04-30 DIAGNOSIS — N2581 Secondary hyperparathyroidism of renal origin: Secondary | ICD-10-CM | POA: Diagnosis not present

## 2020-04-30 DIAGNOSIS — Z992 Dependence on renal dialysis: Secondary | ICD-10-CM | POA: Diagnosis not present

## 2020-04-30 DIAGNOSIS — E1122 Type 2 diabetes mellitus with diabetic chronic kidney disease: Secondary | ICD-10-CM | POA: Diagnosis not present

## 2020-04-30 DIAGNOSIS — N186 End stage renal disease: Secondary | ICD-10-CM | POA: Diagnosis not present

## 2020-05-02 DIAGNOSIS — E1122 Type 2 diabetes mellitus with diabetic chronic kidney disease: Secondary | ICD-10-CM | POA: Diagnosis not present

## 2020-05-02 DIAGNOSIS — N186 End stage renal disease: Secondary | ICD-10-CM | POA: Diagnosis not present

## 2020-05-02 DIAGNOSIS — D631 Anemia in chronic kidney disease: Secondary | ICD-10-CM | POA: Diagnosis not present

## 2020-05-02 DIAGNOSIS — N2581 Secondary hyperparathyroidism of renal origin: Secondary | ICD-10-CM | POA: Diagnosis not present

## 2020-05-02 DIAGNOSIS — Z992 Dependence on renal dialysis: Secondary | ICD-10-CM | POA: Diagnosis not present

## 2020-05-04 DIAGNOSIS — E1122 Type 2 diabetes mellitus with diabetic chronic kidney disease: Secondary | ICD-10-CM | POA: Diagnosis not present

## 2020-05-04 DIAGNOSIS — Z992 Dependence on renal dialysis: Secondary | ICD-10-CM | POA: Diagnosis not present

## 2020-05-04 DIAGNOSIS — N2581 Secondary hyperparathyroidism of renal origin: Secondary | ICD-10-CM | POA: Diagnosis not present

## 2020-05-04 DIAGNOSIS — D631 Anemia in chronic kidney disease: Secondary | ICD-10-CM | POA: Diagnosis not present

## 2020-05-04 DIAGNOSIS — N186 End stage renal disease: Secondary | ICD-10-CM | POA: Diagnosis not present

## 2020-05-07 DIAGNOSIS — N2581 Secondary hyperparathyroidism of renal origin: Secondary | ICD-10-CM | POA: Diagnosis not present

## 2020-05-07 DIAGNOSIS — E1122 Type 2 diabetes mellitus with diabetic chronic kidney disease: Secondary | ICD-10-CM | POA: Diagnosis not present

## 2020-05-07 DIAGNOSIS — N186 End stage renal disease: Secondary | ICD-10-CM | POA: Diagnosis not present

## 2020-05-07 DIAGNOSIS — D631 Anemia in chronic kidney disease: Secondary | ICD-10-CM | POA: Diagnosis not present

## 2020-05-07 DIAGNOSIS — Z992 Dependence on renal dialysis: Secondary | ICD-10-CM | POA: Diagnosis not present

## 2020-05-09 DIAGNOSIS — D631 Anemia in chronic kidney disease: Secondary | ICD-10-CM | POA: Diagnosis not present

## 2020-05-09 DIAGNOSIS — Z992 Dependence on renal dialysis: Secondary | ICD-10-CM | POA: Diagnosis not present

## 2020-05-09 DIAGNOSIS — N2581 Secondary hyperparathyroidism of renal origin: Secondary | ICD-10-CM | POA: Diagnosis not present

## 2020-05-09 DIAGNOSIS — N186 End stage renal disease: Secondary | ICD-10-CM | POA: Diagnosis not present

## 2020-05-09 DIAGNOSIS — E1122 Type 2 diabetes mellitus with diabetic chronic kidney disease: Secondary | ICD-10-CM | POA: Diagnosis not present

## 2020-05-11 DIAGNOSIS — Z992 Dependence on renal dialysis: Secondary | ICD-10-CM | POA: Diagnosis not present

## 2020-05-11 DIAGNOSIS — N2581 Secondary hyperparathyroidism of renal origin: Secondary | ICD-10-CM | POA: Diagnosis not present

## 2020-05-11 DIAGNOSIS — N186 End stage renal disease: Secondary | ICD-10-CM | POA: Diagnosis not present

## 2020-05-11 DIAGNOSIS — D631 Anemia in chronic kidney disease: Secondary | ICD-10-CM | POA: Diagnosis not present

## 2020-05-11 DIAGNOSIS — E1122 Type 2 diabetes mellitus with diabetic chronic kidney disease: Secondary | ICD-10-CM | POA: Diagnosis not present

## 2020-05-14 DIAGNOSIS — N186 End stage renal disease: Secondary | ICD-10-CM | POA: Diagnosis not present

## 2020-05-14 DIAGNOSIS — N2581 Secondary hyperparathyroidism of renal origin: Secondary | ICD-10-CM | POA: Diagnosis not present

## 2020-05-14 DIAGNOSIS — D631 Anemia in chronic kidney disease: Secondary | ICD-10-CM | POA: Diagnosis not present

## 2020-05-14 DIAGNOSIS — E1122 Type 2 diabetes mellitus with diabetic chronic kidney disease: Secondary | ICD-10-CM | POA: Diagnosis not present

## 2020-05-14 DIAGNOSIS — Z992 Dependence on renal dialysis: Secondary | ICD-10-CM | POA: Diagnosis not present

## 2020-05-16 DIAGNOSIS — N186 End stage renal disease: Secondary | ICD-10-CM | POA: Diagnosis not present

## 2020-05-16 DIAGNOSIS — D631 Anemia in chronic kidney disease: Secondary | ICD-10-CM | POA: Diagnosis not present

## 2020-05-16 DIAGNOSIS — E1122 Type 2 diabetes mellitus with diabetic chronic kidney disease: Secondary | ICD-10-CM | POA: Diagnosis not present

## 2020-05-16 DIAGNOSIS — Z992 Dependence on renal dialysis: Secondary | ICD-10-CM | POA: Diagnosis not present

## 2020-05-16 DIAGNOSIS — N2581 Secondary hyperparathyroidism of renal origin: Secondary | ICD-10-CM | POA: Diagnosis not present

## 2020-05-17 DIAGNOSIS — Z992 Dependence on renal dialysis: Secondary | ICD-10-CM | POA: Diagnosis not present

## 2020-05-17 DIAGNOSIS — I129 Hypertensive chronic kidney disease with stage 1 through stage 4 chronic kidney disease, or unspecified chronic kidney disease: Secondary | ICD-10-CM | POA: Diagnosis not present

## 2020-05-17 DIAGNOSIS — N186 End stage renal disease: Secondary | ICD-10-CM | POA: Diagnosis not present

## 2020-05-18 DIAGNOSIS — E1122 Type 2 diabetes mellitus with diabetic chronic kidney disease: Secondary | ICD-10-CM | POA: Diagnosis not present

## 2020-05-18 DIAGNOSIS — D631 Anemia in chronic kidney disease: Secondary | ICD-10-CM | POA: Diagnosis not present

## 2020-05-18 DIAGNOSIS — N186 End stage renal disease: Secondary | ICD-10-CM | POA: Diagnosis not present

## 2020-05-18 DIAGNOSIS — Z992 Dependence on renal dialysis: Secondary | ICD-10-CM | POA: Diagnosis not present

## 2020-05-18 DIAGNOSIS — D509 Iron deficiency anemia, unspecified: Secondary | ICD-10-CM | POA: Diagnosis not present

## 2020-05-18 DIAGNOSIS — N2581 Secondary hyperparathyroidism of renal origin: Secondary | ICD-10-CM | POA: Diagnosis not present

## 2020-05-21 DIAGNOSIS — N186 End stage renal disease: Secondary | ICD-10-CM | POA: Diagnosis not present

## 2020-05-21 DIAGNOSIS — Z992 Dependence on renal dialysis: Secondary | ICD-10-CM | POA: Diagnosis not present

## 2020-05-21 DIAGNOSIS — D509 Iron deficiency anemia, unspecified: Secondary | ICD-10-CM | POA: Diagnosis not present

## 2020-05-21 DIAGNOSIS — N2581 Secondary hyperparathyroidism of renal origin: Secondary | ICD-10-CM | POA: Diagnosis not present

## 2020-05-21 DIAGNOSIS — E1122 Type 2 diabetes mellitus with diabetic chronic kidney disease: Secondary | ICD-10-CM | POA: Diagnosis not present

## 2020-05-21 DIAGNOSIS — D631 Anemia in chronic kidney disease: Secondary | ICD-10-CM | POA: Diagnosis not present

## 2020-05-22 DIAGNOSIS — I5032 Chronic diastolic (congestive) heart failure: Secondary | ICD-10-CM | POA: Diagnosis not present

## 2020-05-22 DIAGNOSIS — Z741 Need for assistance with personal care: Secondary | ICD-10-CM | POA: Diagnosis not present

## 2020-05-22 DIAGNOSIS — R252 Cramp and spasm: Secondary | ICD-10-CM | POA: Diagnosis not present

## 2020-05-22 DIAGNOSIS — S61419A Laceration without foreign body of unspecified hand, initial encounter: Secondary | ICD-10-CM | POA: Diagnosis not present

## 2020-05-22 DIAGNOSIS — Z794 Long term (current) use of insulin: Secondary | ICD-10-CM | POA: Diagnosis not present

## 2020-05-22 DIAGNOSIS — E1169 Type 2 diabetes mellitus with other specified complication: Secondary | ICD-10-CM | POA: Diagnosis not present

## 2020-05-22 DIAGNOSIS — I951 Orthostatic hypotension: Secondary | ICD-10-CM | POA: Diagnosis not present

## 2020-05-22 DIAGNOSIS — N186 End stage renal disease: Secondary | ICD-10-CM | POA: Diagnosis not present

## 2020-05-23 DIAGNOSIS — D509 Iron deficiency anemia, unspecified: Secondary | ICD-10-CM | POA: Diagnosis not present

## 2020-05-23 DIAGNOSIS — E1122 Type 2 diabetes mellitus with diabetic chronic kidney disease: Secondary | ICD-10-CM | POA: Diagnosis not present

## 2020-05-23 DIAGNOSIS — N186 End stage renal disease: Secondary | ICD-10-CM | POA: Diagnosis not present

## 2020-05-23 DIAGNOSIS — D631 Anemia in chronic kidney disease: Secondary | ICD-10-CM | POA: Diagnosis not present

## 2020-05-23 DIAGNOSIS — N2581 Secondary hyperparathyroidism of renal origin: Secondary | ICD-10-CM | POA: Diagnosis not present

## 2020-05-23 DIAGNOSIS — Z992 Dependence on renal dialysis: Secondary | ICD-10-CM | POA: Diagnosis not present

## 2020-05-24 IMAGING — DX DG CHEST 1V PORT
1 series · 1 of 1 positions shown · non-contrast
Comparison: December 07, 2018.

CLINICAL DATA: Status post cardiac surgery.

EXAM:
PORTABLE CHEST 1 VIEW

[chest ap]
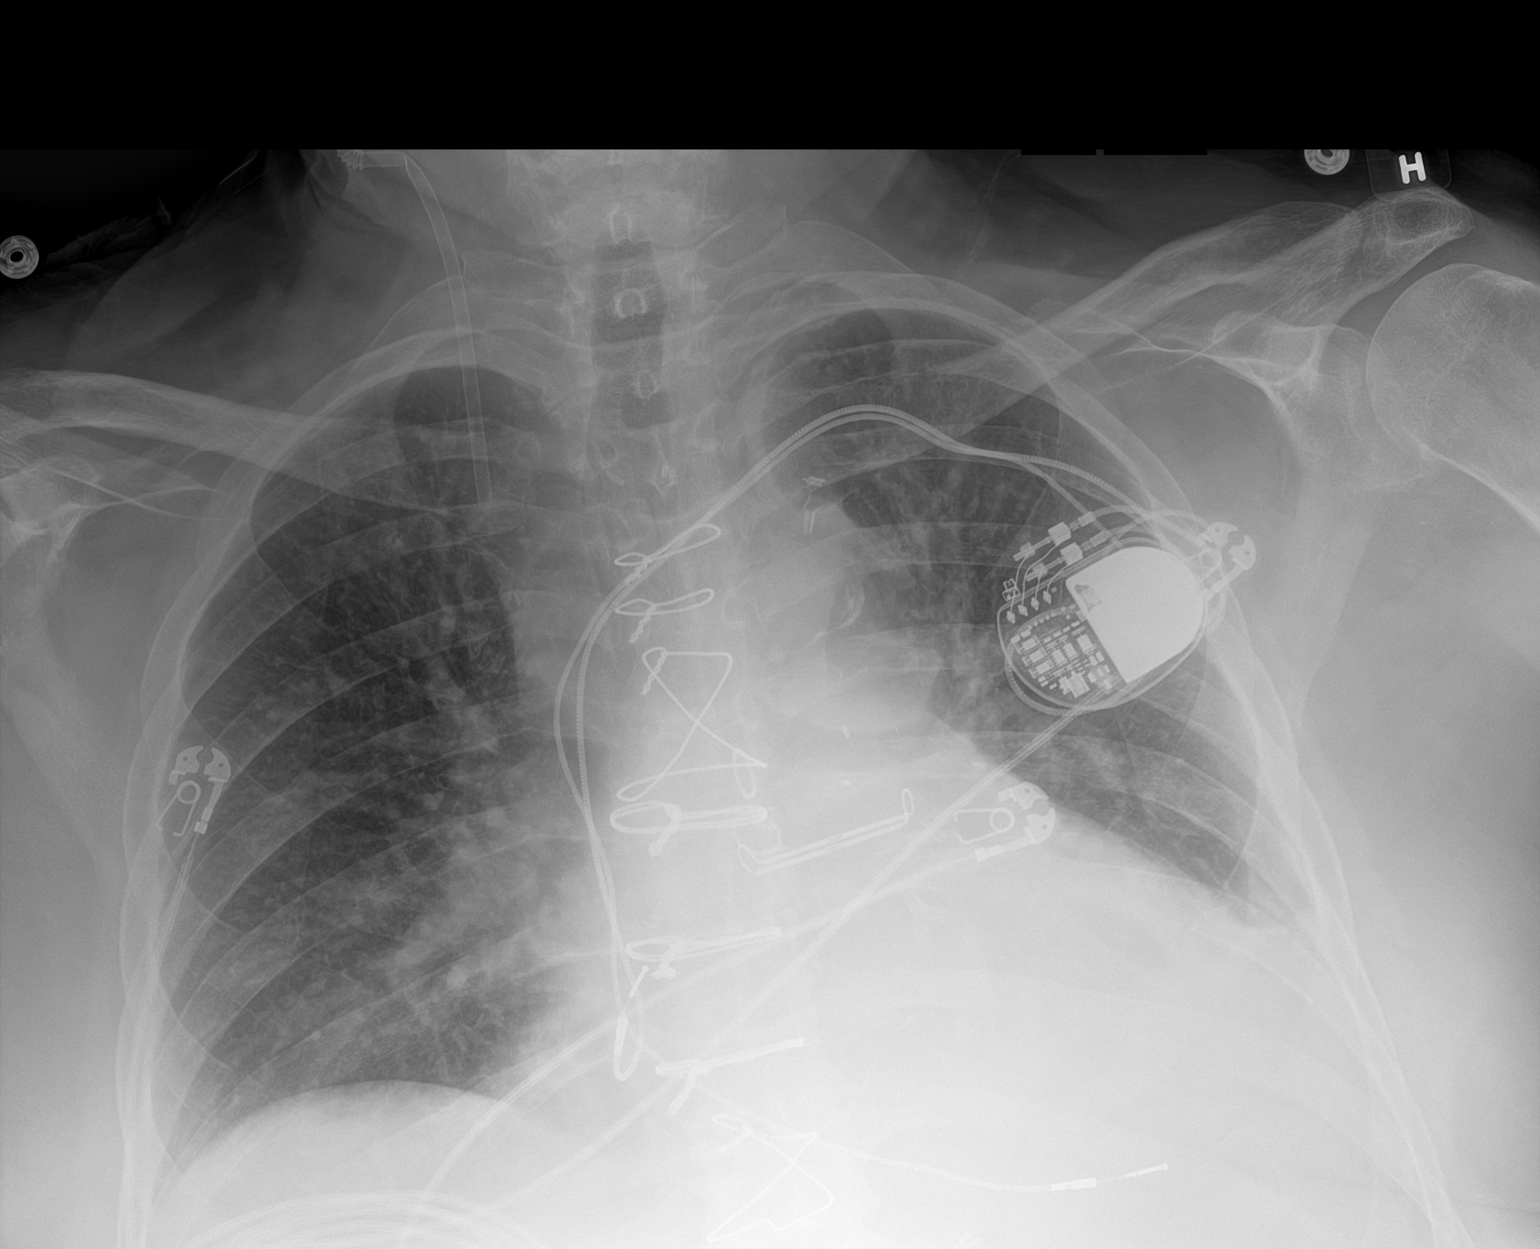

[1 of 1 positions shown; findings below may reference images not displayed]

FINDINGS: Stable cardiomegaly with central pulmonary vascular congestion. No
pneumothorax is noted. Right internal jugular venous sheath is
unchanged. Left-sided pacemaker is unchanged. Mild bibasilar
atelectasis is noted. Bony thorax is unremarkable.
IMPRESSION: Stable cardiomegaly with central pulmonary vascular congestion. Mild
bibasilar subsegmental atelectasis.

## 2020-05-25 DIAGNOSIS — N186 End stage renal disease: Secondary | ICD-10-CM | POA: Diagnosis not present

## 2020-05-25 DIAGNOSIS — D509 Iron deficiency anemia, unspecified: Secondary | ICD-10-CM | POA: Diagnosis not present

## 2020-05-25 DIAGNOSIS — D631 Anemia in chronic kidney disease: Secondary | ICD-10-CM | POA: Diagnosis not present

## 2020-05-25 DIAGNOSIS — N2581 Secondary hyperparathyroidism of renal origin: Secondary | ICD-10-CM | POA: Diagnosis not present

## 2020-05-25 DIAGNOSIS — Z992 Dependence on renal dialysis: Secondary | ICD-10-CM | POA: Diagnosis not present

## 2020-05-25 DIAGNOSIS — E1122 Type 2 diabetes mellitus with diabetic chronic kidney disease: Secondary | ICD-10-CM | POA: Diagnosis not present

## 2020-05-28 DIAGNOSIS — D509 Iron deficiency anemia, unspecified: Secondary | ICD-10-CM | POA: Diagnosis not present

## 2020-05-28 DIAGNOSIS — E1122 Type 2 diabetes mellitus with diabetic chronic kidney disease: Secondary | ICD-10-CM | POA: Diagnosis not present

## 2020-05-28 DIAGNOSIS — N186 End stage renal disease: Secondary | ICD-10-CM | POA: Diagnosis not present

## 2020-05-28 DIAGNOSIS — D631 Anemia in chronic kidney disease: Secondary | ICD-10-CM | POA: Diagnosis not present

## 2020-05-28 DIAGNOSIS — N2581 Secondary hyperparathyroidism of renal origin: Secondary | ICD-10-CM | POA: Diagnosis not present

## 2020-05-28 DIAGNOSIS — Z992 Dependence on renal dialysis: Secondary | ICD-10-CM | POA: Diagnosis not present

## 2020-05-29 ENCOUNTER — Other Ambulatory Visit: Payer: Self-pay | Admitting: Cardiovascular Disease

## 2020-05-30 DIAGNOSIS — N2581 Secondary hyperparathyroidism of renal origin: Secondary | ICD-10-CM | POA: Diagnosis not present

## 2020-05-30 DIAGNOSIS — D509 Iron deficiency anemia, unspecified: Secondary | ICD-10-CM | POA: Diagnosis not present

## 2020-05-30 DIAGNOSIS — E1122 Type 2 diabetes mellitus with diabetic chronic kidney disease: Secondary | ICD-10-CM | POA: Diagnosis not present

## 2020-05-30 DIAGNOSIS — D631 Anemia in chronic kidney disease: Secondary | ICD-10-CM | POA: Diagnosis not present

## 2020-05-30 DIAGNOSIS — Z992 Dependence on renal dialysis: Secondary | ICD-10-CM | POA: Diagnosis not present

## 2020-05-30 DIAGNOSIS — N186 End stage renal disease: Secondary | ICD-10-CM | POA: Diagnosis not present

## 2020-05-31 ENCOUNTER — Ambulatory Visit (INDEPENDENT_AMBULATORY_CARE_PROVIDER_SITE_OTHER): Payer: Medicare Other

## 2020-05-31 DIAGNOSIS — I480 Paroxysmal atrial fibrillation: Secondary | ICD-10-CM | POA: Diagnosis not present

## 2020-06-01 DIAGNOSIS — E1122 Type 2 diabetes mellitus with diabetic chronic kidney disease: Secondary | ICD-10-CM | POA: Diagnosis not present

## 2020-06-01 DIAGNOSIS — D509 Iron deficiency anemia, unspecified: Secondary | ICD-10-CM | POA: Diagnosis not present

## 2020-06-01 DIAGNOSIS — Z992 Dependence on renal dialysis: Secondary | ICD-10-CM | POA: Diagnosis not present

## 2020-06-01 DIAGNOSIS — N186 End stage renal disease: Secondary | ICD-10-CM | POA: Diagnosis not present

## 2020-06-01 DIAGNOSIS — N2581 Secondary hyperparathyroidism of renal origin: Secondary | ICD-10-CM | POA: Diagnosis not present

## 2020-06-01 DIAGNOSIS — D631 Anemia in chronic kidney disease: Secondary | ICD-10-CM | POA: Diagnosis not present

## 2020-06-01 IMAGING — DX DG CHEST 2V
2 series · 2 of 2 positions shown · non-contrast
Comparison: 12/10/2018.  CT 12/01/2018.

CLINICAL DATA: CABG.

EXAM:
CHEST - 2 VIEW

[dg chest 2 view (1 of 2)]
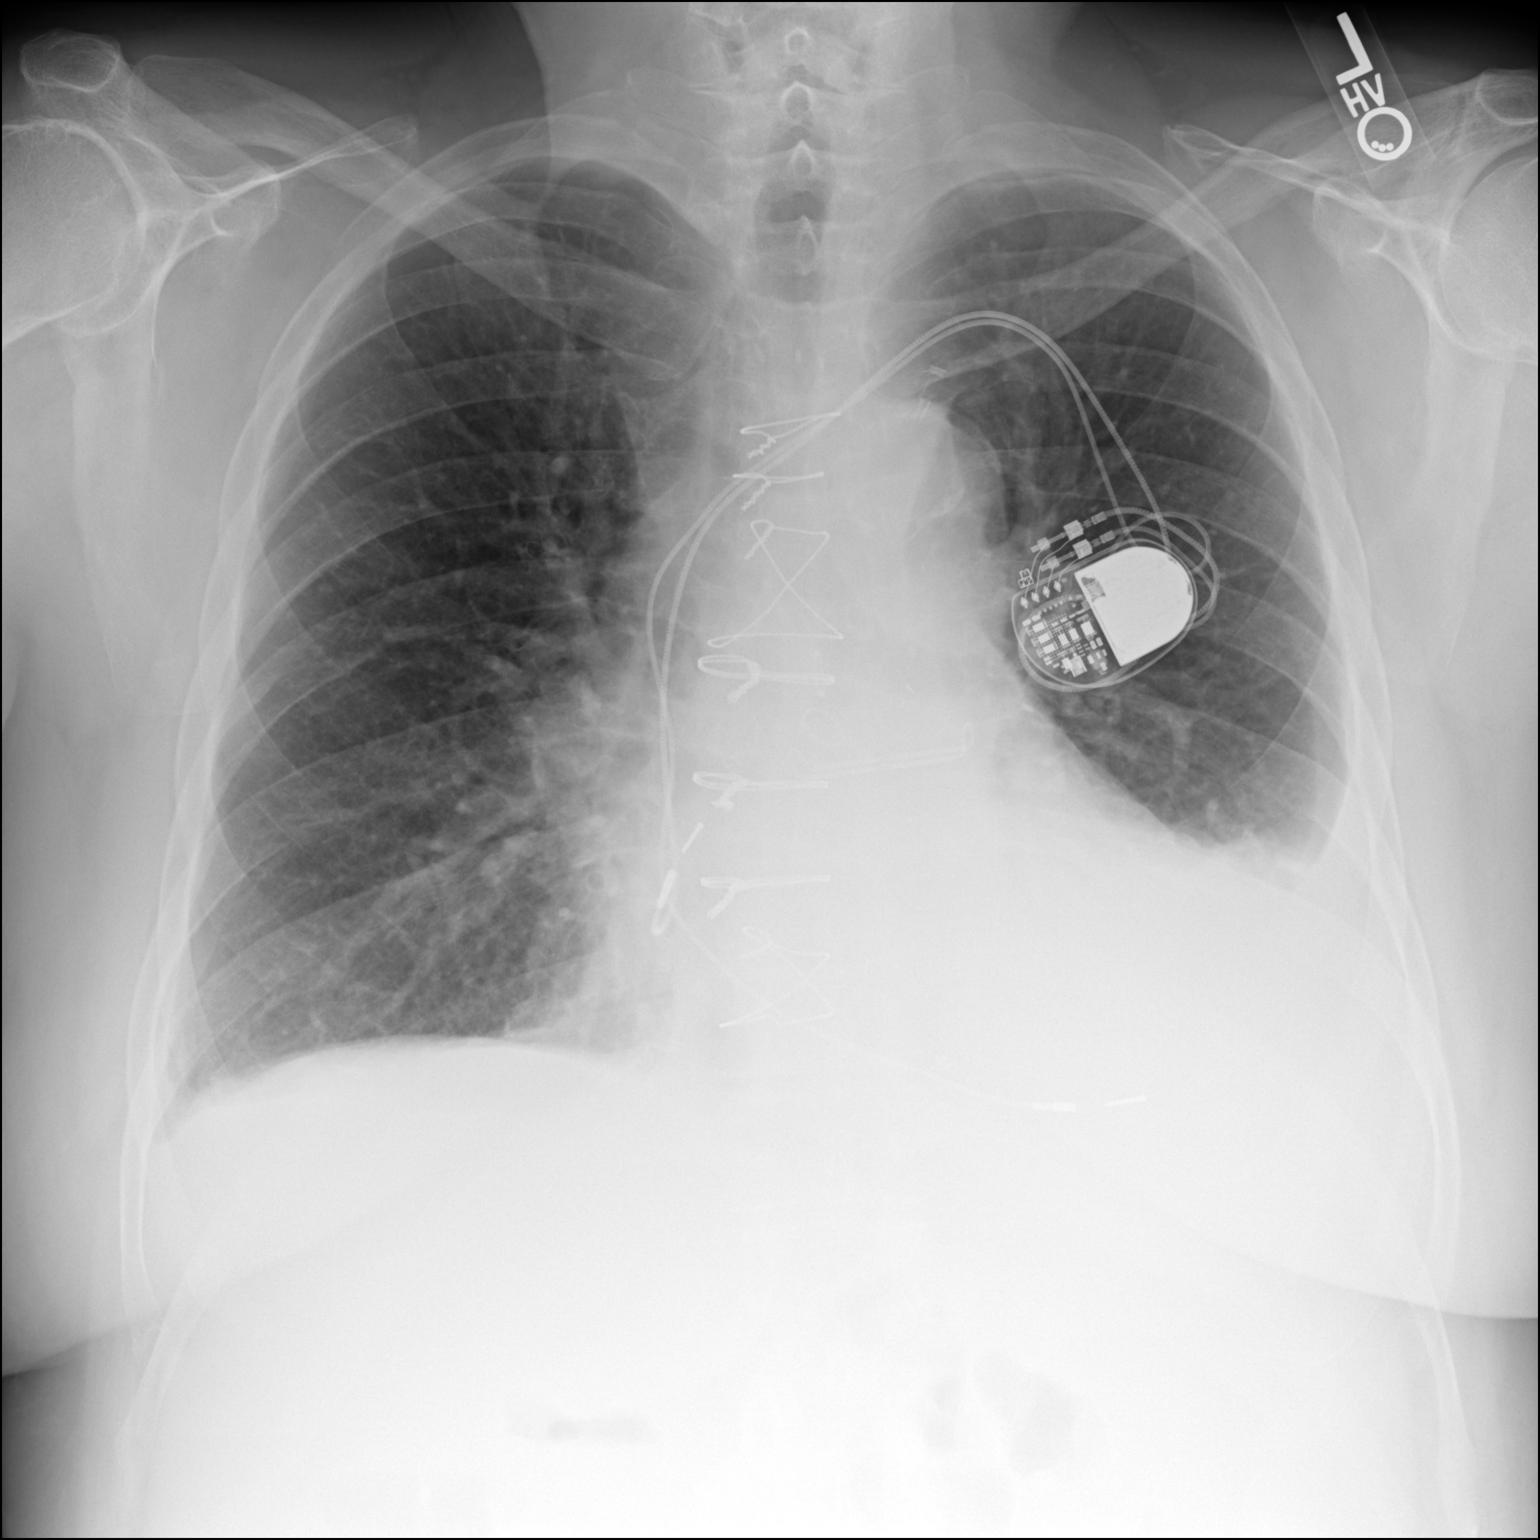

[dg chest 2 view (2 of 2)]
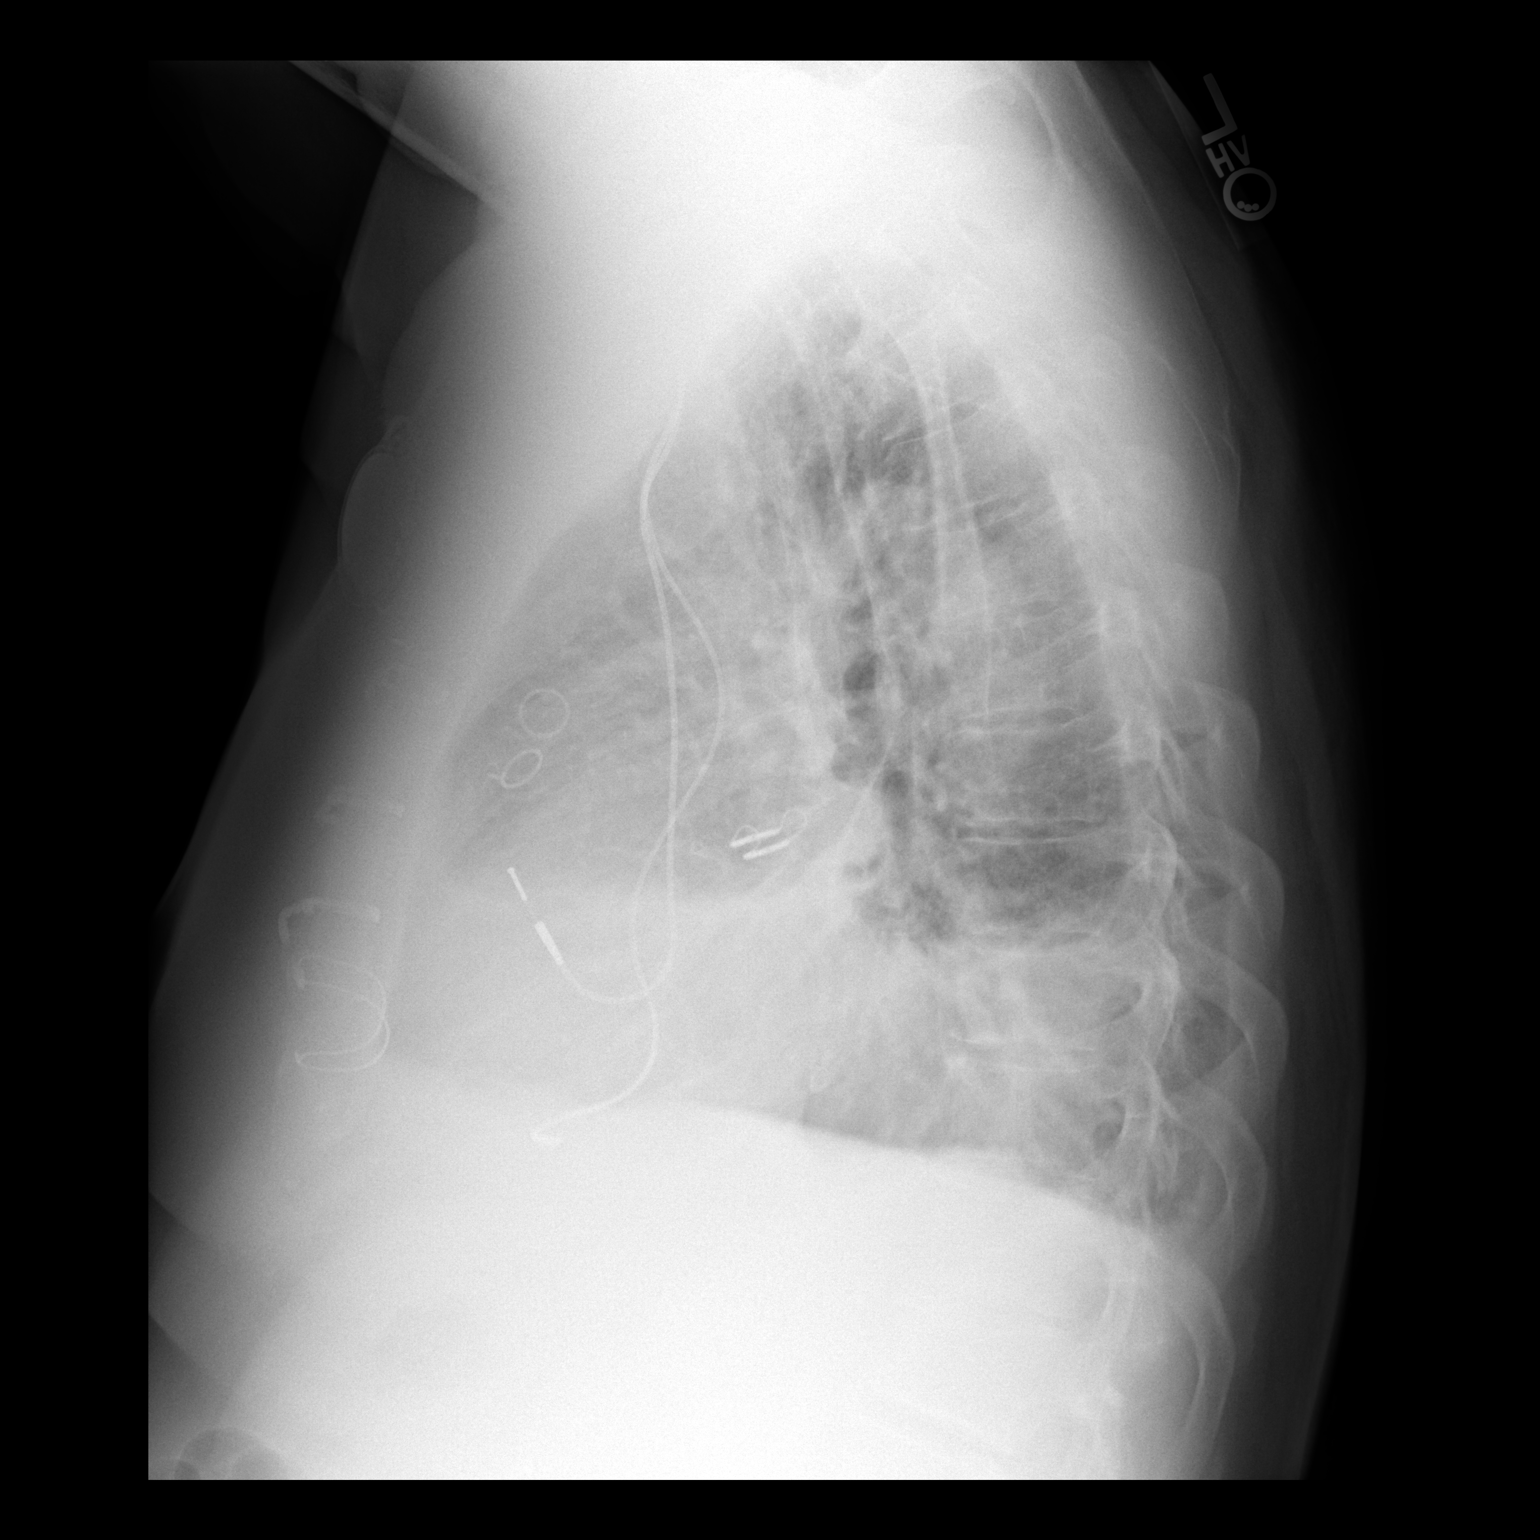

[2 of 2 positions shown; findings below may reference images not displayed]

FINDINGS: Prior CABG. Cardiac pacer with lead tip over the right atrium right
ventricle. Cardiomegaly. Mild pulmonary venous congestion and
bilateral interstitial prominence. Left lower lobe
atelectasis/infiltrate. Bilateral pleural effusions left side
greater than right. Chest is unchanged from prior exam. No
pneumothorax.
IMPRESSION: 1. Cardiac pacer stable position. Prior CABG. Cardiomegaly with
pulmonary venous congestion, bibasilar interstitial prominence, and
bilateral pleural effusions, left side greater than right. Findings
suggest mild CHF. No interim change from prior exam.

2.  Persistent left lower lobe atelectasis/infiltrate.

## 2020-06-04 DIAGNOSIS — D509 Iron deficiency anemia, unspecified: Secondary | ICD-10-CM | POA: Diagnosis not present

## 2020-06-04 DIAGNOSIS — D631 Anemia in chronic kidney disease: Secondary | ICD-10-CM | POA: Diagnosis not present

## 2020-06-04 DIAGNOSIS — N186 End stage renal disease: Secondary | ICD-10-CM | POA: Diagnosis not present

## 2020-06-04 DIAGNOSIS — N2581 Secondary hyperparathyroidism of renal origin: Secondary | ICD-10-CM | POA: Diagnosis not present

## 2020-06-04 DIAGNOSIS — Z992 Dependence on renal dialysis: Secondary | ICD-10-CM | POA: Diagnosis not present

## 2020-06-04 DIAGNOSIS — E1122 Type 2 diabetes mellitus with diabetic chronic kidney disease: Secondary | ICD-10-CM | POA: Diagnosis not present

## 2020-06-04 LAB — CUP PACEART REMOTE DEVICE CHECK
Battery Remaining Longevity: 128 mo
Battery Voltage: 3.07 V
Brady Statistic AP VP Percent: 0.01 %
Brady Statistic AP VS Percent: 14.14 %
Brady Statistic AS VP Percent: 0.05 %
Brady Statistic AS VS Percent: 85.81 %
Brady Statistic RA Percent Paced: 14.19 %
Brady Statistic RV Percent Paced: 0.05 %
Date Time Interrogation Session: 20220415025116
Implantable Lead Implant Date: 20020325
Implantable Lead Implant Date: 20020325
Implantable Lead Location: 753859
Implantable Lead Location: 753860
Implantable Lead Model: 4092
Implantable Lead Model: 4524
Implantable Pulse Generator Implant Date: 20210415
Lead Channel Impedance Value: 266 Ohm
Lead Channel Impedance Value: 304 Ohm
Lead Channel Impedance Value: 475 Ohm
Lead Channel Impedance Value: 570 Ohm
Lead Channel Pacing Threshold Amplitude: 0.875 V
Lead Channel Pacing Threshold Amplitude: 2.5 V
Lead Channel Pacing Threshold Pulse Width: 0.4 ms
Lead Channel Pacing Threshold Pulse Width: 0.4 ms
Lead Channel Sensing Intrinsic Amplitude: 0.75 mV
Lead Channel Sensing Intrinsic Amplitude: 0.75 mV
Lead Channel Sensing Intrinsic Amplitude: 23.25 mV
Lead Channel Sensing Intrinsic Amplitude: 23.25 mV
Lead Channel Setting Pacing Amplitude: 2.5 V
Lead Channel Setting Pacing Amplitude: 3 V
Lead Channel Setting Pacing Pulse Width: 0.4 ms
Lead Channel Setting Sensing Sensitivity: 1.2 mV

## 2020-06-06 DIAGNOSIS — N186 End stage renal disease: Secondary | ICD-10-CM | POA: Diagnosis not present

## 2020-06-06 DIAGNOSIS — D509 Iron deficiency anemia, unspecified: Secondary | ICD-10-CM | POA: Diagnosis not present

## 2020-06-06 DIAGNOSIS — E039 Hypothyroidism, unspecified: Secondary | ICD-10-CM | POA: Diagnosis not present

## 2020-06-06 DIAGNOSIS — E1122 Type 2 diabetes mellitus with diabetic chronic kidney disease: Secondary | ICD-10-CM | POA: Diagnosis not present

## 2020-06-06 DIAGNOSIS — Z992 Dependence on renal dialysis: Secondary | ICD-10-CM | POA: Diagnosis not present

## 2020-06-06 DIAGNOSIS — N2581 Secondary hyperparathyroidism of renal origin: Secondary | ICD-10-CM | POA: Diagnosis not present

## 2020-06-06 DIAGNOSIS — D631 Anemia in chronic kidney disease: Secondary | ICD-10-CM | POA: Diagnosis not present

## 2020-06-06 IMAGING — US IR THORACENTESIS ASP PLEURAL SPACE W/IMG GUIDE
1 series · 3 of 3 positions shown · non-contrast
Comparison: none

INDICATION: Patient status post recent CABG, now with left pleural effusion and
shortness of breath. Request is made for diagnostic and therapeutic
thoracentesis.

[Series 1: ir (id) (id)/(id)/(id) ir · 3 of 3 slices shown]
[im 1/3]
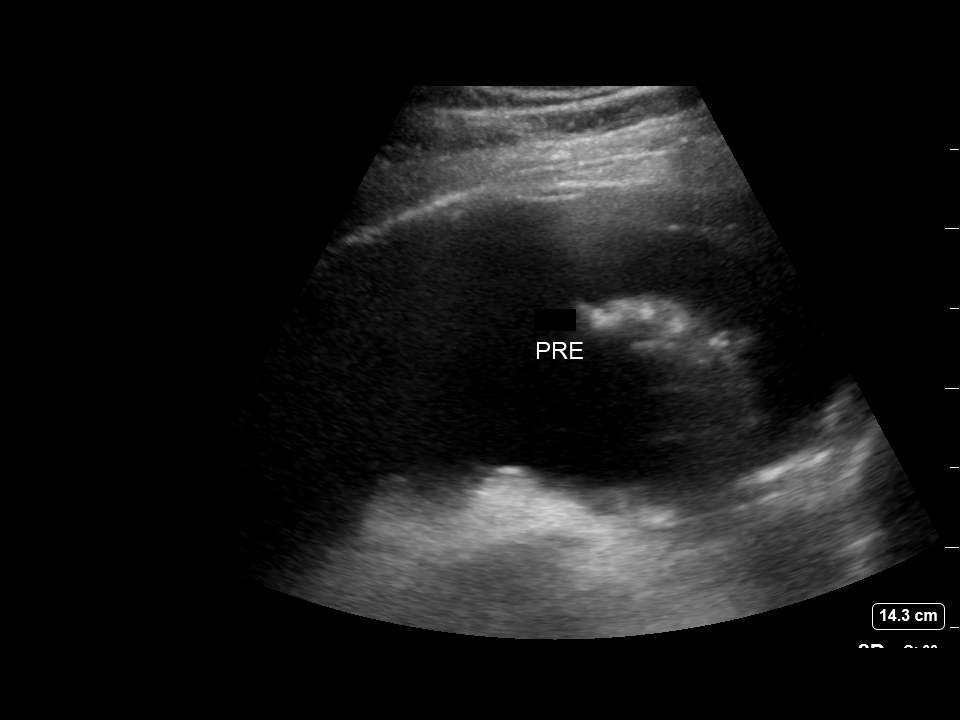
[im 2/3]
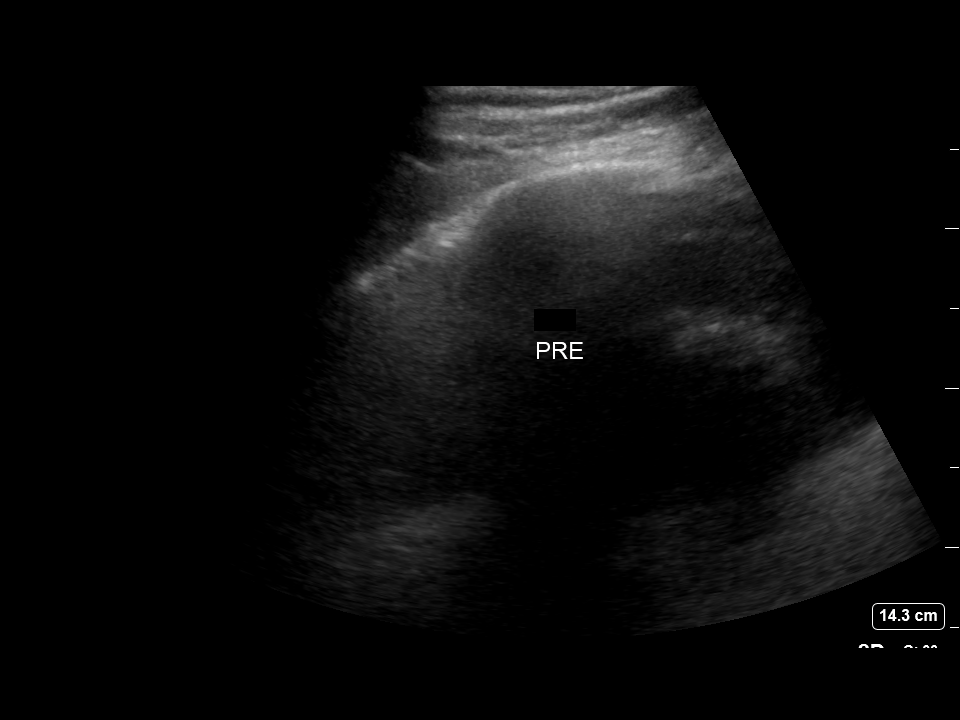
[im 3/3]
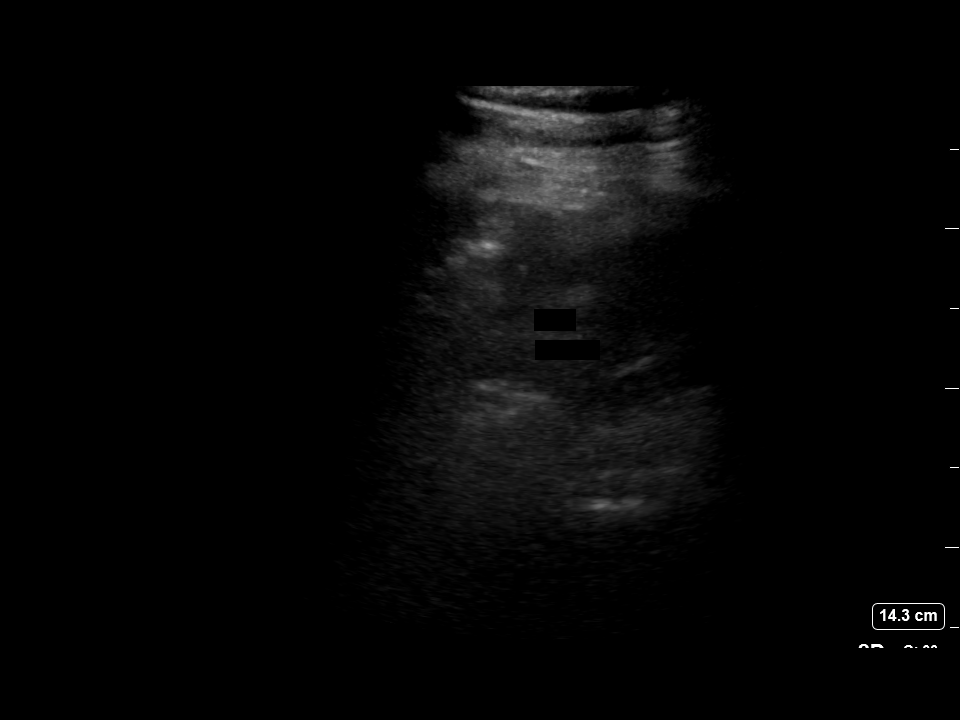

[3 of 3 positions shown; findings below may reference images not displayed]

EXAM:
ULTRASOUND GUIDED DIAGNOSTIC AND THERAPEUTIC LEFT THORACENTESIS

MEDICATIONS:
10 mL 1% lidocaine

COMPLICATIONS:
None immediate.

PROCEDURE:
An ultrasound guided thoracentesis was thoroughly discussed with the
patient and questions answered. The benefits, risks, alternatives
and complications were also discussed. The patient understands and
wishes to proceed with the procedure. Written consent was obtained.

Ultrasound was performed to localize and mark an adequate pocket of
fluid in the left chest. The area was then prepped and draped in the
normal sterile fashion. 1% Lidocaine was used for local anesthesia.
Under ultrasound guidance a 6 Fr Safe-T-Centesis catheter was
introduced. Thoracentesis was performed. The catheter was removed
and a dressing applied.
FINDINGS: A total of approximately 1.4 liters of read, amber fluid was
removed. Samples were sent to the laboratory as requested by the
clinical team.
IMPRESSION: Successful ultrasound guided diagnostic and therapeutic left
thoracentesis yielding 1.4 liters of pleural fluid.

## 2020-06-08 DIAGNOSIS — N2581 Secondary hyperparathyroidism of renal origin: Secondary | ICD-10-CM | POA: Diagnosis not present

## 2020-06-08 DIAGNOSIS — D509 Iron deficiency anemia, unspecified: Secondary | ICD-10-CM | POA: Diagnosis not present

## 2020-06-08 DIAGNOSIS — N186 End stage renal disease: Secondary | ICD-10-CM | POA: Diagnosis not present

## 2020-06-08 DIAGNOSIS — E1122 Type 2 diabetes mellitus with diabetic chronic kidney disease: Secondary | ICD-10-CM | POA: Diagnosis not present

## 2020-06-08 DIAGNOSIS — Z992 Dependence on renal dialysis: Secondary | ICD-10-CM | POA: Diagnosis not present

## 2020-06-08 DIAGNOSIS — D631 Anemia in chronic kidney disease: Secondary | ICD-10-CM | POA: Diagnosis not present

## 2020-06-11 DIAGNOSIS — D509 Iron deficiency anemia, unspecified: Secondary | ICD-10-CM | POA: Diagnosis not present

## 2020-06-11 DIAGNOSIS — Z992 Dependence on renal dialysis: Secondary | ICD-10-CM | POA: Diagnosis not present

## 2020-06-11 DIAGNOSIS — E1122 Type 2 diabetes mellitus with diabetic chronic kidney disease: Secondary | ICD-10-CM | POA: Diagnosis not present

## 2020-06-11 DIAGNOSIS — D631 Anemia in chronic kidney disease: Secondary | ICD-10-CM | POA: Diagnosis not present

## 2020-06-11 DIAGNOSIS — N186 End stage renal disease: Secondary | ICD-10-CM | POA: Diagnosis not present

## 2020-06-11 DIAGNOSIS — N2581 Secondary hyperparathyroidism of renal origin: Secondary | ICD-10-CM | POA: Diagnosis not present

## 2020-06-13 DIAGNOSIS — Z992 Dependence on renal dialysis: Secondary | ICD-10-CM | POA: Diagnosis not present

## 2020-06-13 DIAGNOSIS — D631 Anemia in chronic kidney disease: Secondary | ICD-10-CM | POA: Diagnosis not present

## 2020-06-13 DIAGNOSIS — N2581 Secondary hyperparathyroidism of renal origin: Secondary | ICD-10-CM | POA: Diagnosis not present

## 2020-06-13 DIAGNOSIS — N186 End stage renal disease: Secondary | ICD-10-CM | POA: Diagnosis not present

## 2020-06-13 DIAGNOSIS — E1122 Type 2 diabetes mellitus with diabetic chronic kidney disease: Secondary | ICD-10-CM | POA: Diagnosis not present

## 2020-06-13 DIAGNOSIS — D509 Iron deficiency anemia, unspecified: Secondary | ICD-10-CM | POA: Diagnosis not present

## 2020-06-15 DIAGNOSIS — Z992 Dependence on renal dialysis: Secondary | ICD-10-CM | POA: Diagnosis not present

## 2020-06-15 DIAGNOSIS — D631 Anemia in chronic kidney disease: Secondary | ICD-10-CM | POA: Diagnosis not present

## 2020-06-15 DIAGNOSIS — E1122 Type 2 diabetes mellitus with diabetic chronic kidney disease: Secondary | ICD-10-CM | POA: Diagnosis not present

## 2020-06-15 DIAGNOSIS — D509 Iron deficiency anemia, unspecified: Secondary | ICD-10-CM | POA: Diagnosis not present

## 2020-06-15 DIAGNOSIS — N2581 Secondary hyperparathyroidism of renal origin: Secondary | ICD-10-CM | POA: Diagnosis not present

## 2020-06-15 DIAGNOSIS — N186 End stage renal disease: Secondary | ICD-10-CM | POA: Diagnosis not present

## 2020-06-16 DIAGNOSIS — I129 Hypertensive chronic kidney disease with stage 1 through stage 4 chronic kidney disease, or unspecified chronic kidney disease: Secondary | ICD-10-CM | POA: Diagnosis not present

## 2020-06-16 DIAGNOSIS — N186 End stage renal disease: Secondary | ICD-10-CM | POA: Diagnosis not present

## 2020-06-16 DIAGNOSIS — Z992 Dependence on renal dialysis: Secondary | ICD-10-CM | POA: Diagnosis not present

## 2020-06-18 DIAGNOSIS — N2581 Secondary hyperparathyroidism of renal origin: Secondary | ICD-10-CM | POA: Diagnosis not present

## 2020-06-18 DIAGNOSIS — E1122 Type 2 diabetes mellitus with diabetic chronic kidney disease: Secondary | ICD-10-CM | POA: Diagnosis not present

## 2020-06-18 DIAGNOSIS — D509 Iron deficiency anemia, unspecified: Secondary | ICD-10-CM | POA: Diagnosis not present

## 2020-06-18 DIAGNOSIS — N186 End stage renal disease: Secondary | ICD-10-CM | POA: Diagnosis not present

## 2020-06-18 DIAGNOSIS — Z992 Dependence on renal dialysis: Secondary | ICD-10-CM | POA: Diagnosis not present

## 2020-06-18 DIAGNOSIS — D631 Anemia in chronic kidney disease: Secondary | ICD-10-CM | POA: Diagnosis not present

## 2020-06-18 NOTE — Progress Notes (Signed)
Remote pacemaker transmission.   

## 2020-06-20 DIAGNOSIS — N186 End stage renal disease: Secondary | ICD-10-CM | POA: Diagnosis not present

## 2020-06-20 DIAGNOSIS — E1122 Type 2 diabetes mellitus with diabetic chronic kidney disease: Secondary | ICD-10-CM | POA: Diagnosis not present

## 2020-06-20 DIAGNOSIS — D631 Anemia in chronic kidney disease: Secondary | ICD-10-CM | POA: Diagnosis not present

## 2020-06-20 DIAGNOSIS — Z992 Dependence on renal dialysis: Secondary | ICD-10-CM | POA: Diagnosis not present

## 2020-06-20 DIAGNOSIS — D509 Iron deficiency anemia, unspecified: Secondary | ICD-10-CM | POA: Diagnosis not present

## 2020-06-20 DIAGNOSIS — N2581 Secondary hyperparathyroidism of renal origin: Secondary | ICD-10-CM | POA: Diagnosis not present

## 2020-06-22 DIAGNOSIS — N186 End stage renal disease: Secondary | ICD-10-CM | POA: Diagnosis not present

## 2020-06-22 DIAGNOSIS — D631 Anemia in chronic kidney disease: Secondary | ICD-10-CM | POA: Diagnosis not present

## 2020-06-22 DIAGNOSIS — E1122 Type 2 diabetes mellitus with diabetic chronic kidney disease: Secondary | ICD-10-CM | POA: Diagnosis not present

## 2020-06-22 DIAGNOSIS — Z992 Dependence on renal dialysis: Secondary | ICD-10-CM | POA: Diagnosis not present

## 2020-06-22 DIAGNOSIS — D509 Iron deficiency anemia, unspecified: Secondary | ICD-10-CM | POA: Diagnosis not present

## 2020-06-22 DIAGNOSIS — N2581 Secondary hyperparathyroidism of renal origin: Secondary | ICD-10-CM | POA: Diagnosis not present

## 2020-06-24 ENCOUNTER — Other Ambulatory Visit: Payer: Self-pay

## 2020-06-24 ENCOUNTER — Ambulatory Visit (INDEPENDENT_AMBULATORY_CARE_PROVIDER_SITE_OTHER): Payer: Medicare Other | Admitting: Physician Assistant

## 2020-06-24 VITALS — BP 141/73 | HR 62 | Temp 98.3°F | Resp 18 | Ht 71.0 in | Wt 215.0 lb

## 2020-06-24 DIAGNOSIS — Z992 Dependence on renal dialysis: Secondary | ICD-10-CM

## 2020-06-24 DIAGNOSIS — N186 End stage renal disease: Secondary | ICD-10-CM | POA: Diagnosis not present

## 2020-06-24 NOTE — Progress Notes (Signed)
POST OPERATIVE DIALYSIS ACCESS OFFICE NOTE    CC:  F/u for dialysis access surgery  HPI:  This is a 80 y.o. male who is s/p banding of right arm brachiocephalic AV fistula by Dr. Trula Slade on 01/24/20. We have been following mild numbness of right 4th and 5th fingers. He states the numbness is about the same or maybe a bit better and is most persistent in his right 5th digit. Does not interfere with writing or ADLs. Has no hand pain.  He states he has a small skin lesion on the knuckle of his right hand that he scratches but that this heals without concern.  No issues with HD treatment. Has left chest PPM.   Dialysis days:  TTS  Dialysis center:  [facility]  Allergies  Allergen Reactions  . Other Other (See Comments)  . Shellfish Allergy Rash    Current Outpatient Medications  Medication Sig Dispense Refill  . allopurinol (ZYLOPRIM) 100 MG tablet Take 1 tablet (100 mg total) by mouth daily. 30 tablet 3  . aspirin EC 81 MG tablet Take 81 mg by mouth daily.    . B Complex-C-Zn-Folic Acid (DIALYVITE 161 WITH ZINC) 0.8 MG TABS Take 1 tablet by mouth at bedtime.    . Cholecalciferol (VITAMIN D-3) 25 MCG (1000 UT) CAPS Take 3,000 Units by mouth daily.     . diphenhydramine-acetaminophen (TYLENOL PM) 25-500 MG TABS tablet Take 0.5 tablets by mouth at bedtime as needed (sleep).     . hydrALAZINE (APRESOLINE) 25 MG tablet Take 1 tablet (25 mg total) by mouth 3 (three) times daily. 90 tablet 3  . HYDROcodone-acetaminophen (NORCO) 5-325 MG tablet Take 1 tablet by mouth every 6 (six) hours as needed for moderate pain. 10 tablet 0  . Methoxy PEG-Epoetin Beta (MIRCERA IJ) Mircera    . metoprolol tartrate (LOPRESSOR) 25 MG tablet Take 1.5 tablets (37.5 mg total) by mouth 2 (two) times daily. 270 tablet 3  . NOVOLOG MIX 70/30 FLEXPEN (70-30) 100 UNIT/ML FlexPen Inject 35 Units into the skin daily.   3  . ondansetron (ZOFRAN) 4 MG tablet Take 1 tablet (4 mg total) by mouth every 6 (six)  hours as needed for nausea. 30 tablet 1  . rosuvastatin (CRESTOR) 20 MG tablet Take 1 tablet (20 mg total) by mouth daily at 6 PM. Please make overdue appt with Dr. [name] before anymore refills. Thank you 2nd attempt 15 tablet 0  . sevelamer carbonate (RENVELA) 800 MG tablet Take 1 tablet (800 mg total) by mouth 3 (three) times daily with meals. (Patient taking differently: Take 800-1,600 mg by mouth See admin instructions. Take 2 tablets (1600 mg) by mouth with each meal & take 1 tablet (800 mg) by mouth with a snack.) 90 tablet 3   No current facility-administered medications for this visit.     ROS:  See HPI Vitals:   06/24/20 1001  Weight: 215 lb (97.5 kg)  Height: 5\' 11"  (1.803 m)    Physical Exam:  General appearance: WD, WN in NAD Cardiac: RRR with occasional dropped beat Respiratory: non-labored Extremities:  RUE: hand with mild pallor, warm with 1+ radial pulse, brisk ulnar and plamar arch Doppler signals. Good bruit and thrill in fistula. Approximately 0.5 cm sclae over 3rd M-P joint that is healing.  No significant augmentation of Doppler signals with compression of fistula.  Assessment/Plan: Status post banding right brachial cephalic fistula for steal syndrome.  The numbness of his left fifth digit persists but he feels  like there has been some mild improvement with time.  Motor function and sensation are intact.  No hand pain.  We discussed follow-up and he will call us should his symptoms return or increase.  Barbie Banner, PA-C 06/24/2020 10:01 AM [facility] [phone]  Clinic MD: Dr. Trula Slade

## 2020-06-25 DIAGNOSIS — E1122 Type 2 diabetes mellitus with diabetic chronic kidney disease: Secondary | ICD-10-CM | POA: Diagnosis not present

## 2020-06-25 DIAGNOSIS — D631 Anemia in chronic kidney disease: Secondary | ICD-10-CM | POA: Diagnosis not present

## 2020-06-25 DIAGNOSIS — Z992 Dependence on renal dialysis: Secondary | ICD-10-CM | POA: Diagnosis not present

## 2020-06-25 DIAGNOSIS — D509 Iron deficiency anemia, unspecified: Secondary | ICD-10-CM | POA: Diagnosis not present

## 2020-06-25 DIAGNOSIS — N186 End stage renal disease: Secondary | ICD-10-CM | POA: Diagnosis not present

## 2020-06-25 DIAGNOSIS — N2581 Secondary hyperparathyroidism of renal origin: Secondary | ICD-10-CM | POA: Diagnosis not present

## 2020-06-27 DIAGNOSIS — D631 Anemia in chronic kidney disease: Secondary | ICD-10-CM | POA: Diagnosis not present

## 2020-06-27 DIAGNOSIS — N2581 Secondary hyperparathyroidism of renal origin: Secondary | ICD-10-CM | POA: Diagnosis not present

## 2020-06-27 DIAGNOSIS — D509 Iron deficiency anemia, unspecified: Secondary | ICD-10-CM | POA: Diagnosis not present

## 2020-06-27 DIAGNOSIS — Z992 Dependence on renal dialysis: Secondary | ICD-10-CM | POA: Diagnosis not present

## 2020-06-27 DIAGNOSIS — E1122 Type 2 diabetes mellitus with diabetic chronic kidney disease: Secondary | ICD-10-CM | POA: Diagnosis not present

## 2020-06-27 DIAGNOSIS — N186 End stage renal disease: Secondary | ICD-10-CM | POA: Diagnosis not present

## 2020-06-29 DIAGNOSIS — D509 Iron deficiency anemia, unspecified: Secondary | ICD-10-CM | POA: Diagnosis not present

## 2020-06-29 DIAGNOSIS — E1122 Type 2 diabetes mellitus with diabetic chronic kidney disease: Secondary | ICD-10-CM | POA: Diagnosis not present

## 2020-06-29 DIAGNOSIS — N2581 Secondary hyperparathyroidism of renal origin: Secondary | ICD-10-CM | POA: Diagnosis not present

## 2020-06-29 DIAGNOSIS — N186 End stage renal disease: Secondary | ICD-10-CM | POA: Diagnosis not present

## 2020-06-29 DIAGNOSIS — Z992 Dependence on renal dialysis: Secondary | ICD-10-CM | POA: Diagnosis not present

## 2020-06-29 DIAGNOSIS — D631 Anemia in chronic kidney disease: Secondary | ICD-10-CM | POA: Diagnosis not present

## 2020-07-02 DIAGNOSIS — E1122 Type 2 diabetes mellitus with diabetic chronic kidney disease: Secondary | ICD-10-CM | POA: Diagnosis not present

## 2020-07-02 DIAGNOSIS — N2581 Secondary hyperparathyroidism of renal origin: Secondary | ICD-10-CM | POA: Diagnosis not present

## 2020-07-02 DIAGNOSIS — Z992 Dependence on renal dialysis: Secondary | ICD-10-CM | POA: Diagnosis not present

## 2020-07-02 DIAGNOSIS — N186 End stage renal disease: Secondary | ICD-10-CM | POA: Diagnosis not present

## 2020-07-02 DIAGNOSIS — D631 Anemia in chronic kidney disease: Secondary | ICD-10-CM | POA: Diagnosis not present

## 2020-07-02 DIAGNOSIS — D509 Iron deficiency anemia, unspecified: Secondary | ICD-10-CM | POA: Diagnosis not present

## 2020-07-04 DIAGNOSIS — D509 Iron deficiency anemia, unspecified: Secondary | ICD-10-CM | POA: Diagnosis not present

## 2020-07-04 DIAGNOSIS — N2581 Secondary hyperparathyroidism of renal origin: Secondary | ICD-10-CM | POA: Diagnosis not present

## 2020-07-04 DIAGNOSIS — E1122 Type 2 diabetes mellitus with diabetic chronic kidney disease: Secondary | ICD-10-CM | POA: Diagnosis not present

## 2020-07-04 DIAGNOSIS — D631 Anemia in chronic kidney disease: Secondary | ICD-10-CM | POA: Diagnosis not present

## 2020-07-04 DIAGNOSIS — N186 End stage renal disease: Secondary | ICD-10-CM | POA: Diagnosis not present

## 2020-07-04 DIAGNOSIS — Z992 Dependence on renal dialysis: Secondary | ICD-10-CM | POA: Diagnosis not present

## 2020-07-06 DIAGNOSIS — Z992 Dependence on renal dialysis: Secondary | ICD-10-CM | POA: Diagnosis not present

## 2020-07-06 DIAGNOSIS — D509 Iron deficiency anemia, unspecified: Secondary | ICD-10-CM | POA: Diagnosis not present

## 2020-07-06 DIAGNOSIS — N2581 Secondary hyperparathyroidism of renal origin: Secondary | ICD-10-CM | POA: Diagnosis not present

## 2020-07-06 DIAGNOSIS — D631 Anemia in chronic kidney disease: Secondary | ICD-10-CM | POA: Diagnosis not present

## 2020-07-06 DIAGNOSIS — N186 End stage renal disease: Secondary | ICD-10-CM | POA: Diagnosis not present

## 2020-07-06 DIAGNOSIS — E1122 Type 2 diabetes mellitus with diabetic chronic kidney disease: Secondary | ICD-10-CM | POA: Diagnosis not present

## 2020-07-09 DIAGNOSIS — N186 End stage renal disease: Secondary | ICD-10-CM | POA: Diagnosis not present

## 2020-07-09 DIAGNOSIS — Z992 Dependence on renal dialysis: Secondary | ICD-10-CM | POA: Diagnosis not present

## 2020-07-09 DIAGNOSIS — D509 Iron deficiency anemia, unspecified: Secondary | ICD-10-CM | POA: Diagnosis not present

## 2020-07-09 DIAGNOSIS — E1122 Type 2 diabetes mellitus with diabetic chronic kidney disease: Secondary | ICD-10-CM | POA: Diagnosis not present

## 2020-07-09 DIAGNOSIS — N2581 Secondary hyperparathyroidism of renal origin: Secondary | ICD-10-CM | POA: Diagnosis not present

## 2020-07-09 DIAGNOSIS — D631 Anemia in chronic kidney disease: Secondary | ICD-10-CM | POA: Diagnosis not present

## 2020-07-11 DIAGNOSIS — E1122 Type 2 diabetes mellitus with diabetic chronic kidney disease: Secondary | ICD-10-CM | POA: Diagnosis not present

## 2020-07-11 DIAGNOSIS — N2581 Secondary hyperparathyroidism of renal origin: Secondary | ICD-10-CM | POA: Diagnosis not present

## 2020-07-11 DIAGNOSIS — D631 Anemia in chronic kidney disease: Secondary | ICD-10-CM | POA: Diagnosis not present

## 2020-07-11 DIAGNOSIS — N186 End stage renal disease: Secondary | ICD-10-CM | POA: Diagnosis not present

## 2020-07-11 DIAGNOSIS — D509 Iron deficiency anemia, unspecified: Secondary | ICD-10-CM | POA: Diagnosis not present

## 2020-07-11 DIAGNOSIS — Z992 Dependence on renal dialysis: Secondary | ICD-10-CM | POA: Diagnosis not present

## 2020-07-13 DIAGNOSIS — E1122 Type 2 diabetes mellitus with diabetic chronic kidney disease: Secondary | ICD-10-CM | POA: Diagnosis not present

## 2020-07-13 DIAGNOSIS — D509 Iron deficiency anemia, unspecified: Secondary | ICD-10-CM | POA: Diagnosis not present

## 2020-07-13 DIAGNOSIS — D631 Anemia in chronic kidney disease: Secondary | ICD-10-CM | POA: Diagnosis not present

## 2020-07-13 DIAGNOSIS — N2581 Secondary hyperparathyroidism of renal origin: Secondary | ICD-10-CM | POA: Diagnosis not present

## 2020-07-13 DIAGNOSIS — N186 End stage renal disease: Secondary | ICD-10-CM | POA: Diagnosis not present

## 2020-07-13 DIAGNOSIS — Z992 Dependence on renal dialysis: Secondary | ICD-10-CM | POA: Diagnosis not present

## 2020-07-16 DIAGNOSIS — N186 End stage renal disease: Secondary | ICD-10-CM | POA: Diagnosis not present

## 2020-07-16 DIAGNOSIS — N2581 Secondary hyperparathyroidism of renal origin: Secondary | ICD-10-CM | POA: Diagnosis not present

## 2020-07-16 DIAGNOSIS — E1122 Type 2 diabetes mellitus with diabetic chronic kidney disease: Secondary | ICD-10-CM | POA: Diagnosis not present

## 2020-07-16 DIAGNOSIS — D509 Iron deficiency anemia, unspecified: Secondary | ICD-10-CM | POA: Diagnosis not present

## 2020-07-16 DIAGNOSIS — D631 Anemia in chronic kidney disease: Secondary | ICD-10-CM | POA: Diagnosis not present

## 2020-07-16 DIAGNOSIS — Z992 Dependence on renal dialysis: Secondary | ICD-10-CM | POA: Diagnosis not present

## 2020-07-17 DIAGNOSIS — I129 Hypertensive chronic kidney disease with stage 1 through stage 4 chronic kidney disease, or unspecified chronic kidney disease: Secondary | ICD-10-CM | POA: Diagnosis not present

## 2020-07-17 DIAGNOSIS — Z992 Dependence on renal dialysis: Secondary | ICD-10-CM | POA: Diagnosis not present

## 2020-07-17 DIAGNOSIS — N186 End stage renal disease: Secondary | ICD-10-CM | POA: Diagnosis not present

## 2020-07-18 DIAGNOSIS — D631 Anemia in chronic kidney disease: Secondary | ICD-10-CM | POA: Diagnosis not present

## 2020-07-18 DIAGNOSIS — E1122 Type 2 diabetes mellitus with diabetic chronic kidney disease: Secondary | ICD-10-CM | POA: Diagnosis not present

## 2020-07-18 DIAGNOSIS — N186 End stage renal disease: Secondary | ICD-10-CM | POA: Diagnosis not present

## 2020-07-18 DIAGNOSIS — D509 Iron deficiency anemia, unspecified: Secondary | ICD-10-CM | POA: Diagnosis not present

## 2020-07-18 DIAGNOSIS — Z992 Dependence on renal dialysis: Secondary | ICD-10-CM | POA: Diagnosis not present

## 2020-07-18 DIAGNOSIS — N2581 Secondary hyperparathyroidism of renal origin: Secondary | ICD-10-CM | POA: Diagnosis not present

## 2020-07-20 DIAGNOSIS — E1122 Type 2 diabetes mellitus with diabetic chronic kidney disease: Secondary | ICD-10-CM | POA: Diagnosis not present

## 2020-07-20 DIAGNOSIS — D509 Iron deficiency anemia, unspecified: Secondary | ICD-10-CM | POA: Diagnosis not present

## 2020-07-20 DIAGNOSIS — Z992 Dependence on renal dialysis: Secondary | ICD-10-CM | POA: Diagnosis not present

## 2020-07-20 DIAGNOSIS — D631 Anemia in chronic kidney disease: Secondary | ICD-10-CM | POA: Diagnosis not present

## 2020-07-20 DIAGNOSIS — N2581 Secondary hyperparathyroidism of renal origin: Secondary | ICD-10-CM | POA: Diagnosis not present

## 2020-07-20 DIAGNOSIS — N186 End stage renal disease: Secondary | ICD-10-CM | POA: Diagnosis not present

## 2020-07-23 DIAGNOSIS — N2581 Secondary hyperparathyroidism of renal origin: Secondary | ICD-10-CM | POA: Diagnosis not present

## 2020-07-23 DIAGNOSIS — Z992 Dependence on renal dialysis: Secondary | ICD-10-CM | POA: Diagnosis not present

## 2020-07-23 DIAGNOSIS — D631 Anemia in chronic kidney disease: Secondary | ICD-10-CM | POA: Diagnosis not present

## 2020-07-23 DIAGNOSIS — D509 Iron deficiency anemia, unspecified: Secondary | ICD-10-CM | POA: Diagnosis not present

## 2020-07-23 DIAGNOSIS — E1122 Type 2 diabetes mellitus with diabetic chronic kidney disease: Secondary | ICD-10-CM | POA: Diagnosis not present

## 2020-07-23 DIAGNOSIS — N186 End stage renal disease: Secondary | ICD-10-CM | POA: Diagnosis not present

## 2020-07-25 DIAGNOSIS — D509 Iron deficiency anemia, unspecified: Secondary | ICD-10-CM | POA: Diagnosis not present

## 2020-07-25 DIAGNOSIS — N2581 Secondary hyperparathyroidism of renal origin: Secondary | ICD-10-CM | POA: Diagnosis not present

## 2020-07-25 DIAGNOSIS — Z992 Dependence on renal dialysis: Secondary | ICD-10-CM | POA: Diagnosis not present

## 2020-07-25 DIAGNOSIS — N186 End stage renal disease: Secondary | ICD-10-CM | POA: Diagnosis not present

## 2020-07-25 DIAGNOSIS — E1122 Type 2 diabetes mellitus with diabetic chronic kidney disease: Secondary | ICD-10-CM | POA: Diagnosis not present

## 2020-07-25 DIAGNOSIS — D631 Anemia in chronic kidney disease: Secondary | ICD-10-CM | POA: Diagnosis not present

## 2020-07-27 DIAGNOSIS — N186 End stage renal disease: Secondary | ICD-10-CM | POA: Diagnosis not present

## 2020-07-27 DIAGNOSIS — N2581 Secondary hyperparathyroidism of renal origin: Secondary | ICD-10-CM | POA: Diagnosis not present

## 2020-07-27 DIAGNOSIS — D631 Anemia in chronic kidney disease: Secondary | ICD-10-CM | POA: Diagnosis not present

## 2020-07-27 DIAGNOSIS — E1122 Type 2 diabetes mellitus with diabetic chronic kidney disease: Secondary | ICD-10-CM | POA: Diagnosis not present

## 2020-07-27 DIAGNOSIS — D509 Iron deficiency anemia, unspecified: Secondary | ICD-10-CM | POA: Diagnosis not present

## 2020-07-27 DIAGNOSIS — Z992 Dependence on renal dialysis: Secondary | ICD-10-CM | POA: Diagnosis not present

## 2020-07-30 DIAGNOSIS — N186 End stage renal disease: Secondary | ICD-10-CM | POA: Diagnosis not present

## 2020-07-30 DIAGNOSIS — N2581 Secondary hyperparathyroidism of renal origin: Secondary | ICD-10-CM | POA: Diagnosis not present

## 2020-07-30 DIAGNOSIS — D509 Iron deficiency anemia, unspecified: Secondary | ICD-10-CM | POA: Diagnosis not present

## 2020-07-30 DIAGNOSIS — E1122 Type 2 diabetes mellitus with diabetic chronic kidney disease: Secondary | ICD-10-CM | POA: Diagnosis not present

## 2020-07-30 DIAGNOSIS — Z992 Dependence on renal dialysis: Secondary | ICD-10-CM | POA: Diagnosis not present

## 2020-07-30 DIAGNOSIS — D631 Anemia in chronic kidney disease: Secondary | ICD-10-CM | POA: Diagnosis not present

## 2020-08-01 DIAGNOSIS — D509 Iron deficiency anemia, unspecified: Secondary | ICD-10-CM | POA: Diagnosis not present

## 2020-08-01 DIAGNOSIS — N186 End stage renal disease: Secondary | ICD-10-CM | POA: Diagnosis not present

## 2020-08-01 DIAGNOSIS — D631 Anemia in chronic kidney disease: Secondary | ICD-10-CM | POA: Diagnosis not present

## 2020-08-01 DIAGNOSIS — Z992 Dependence on renal dialysis: Secondary | ICD-10-CM | POA: Diagnosis not present

## 2020-08-01 DIAGNOSIS — E1122 Type 2 diabetes mellitus with diabetic chronic kidney disease: Secondary | ICD-10-CM | POA: Diagnosis not present

## 2020-08-01 DIAGNOSIS — N2581 Secondary hyperparathyroidism of renal origin: Secondary | ICD-10-CM | POA: Diagnosis not present

## 2020-08-03 DIAGNOSIS — N186 End stage renal disease: Secondary | ICD-10-CM | POA: Diagnosis not present

## 2020-08-03 DIAGNOSIS — D509 Iron deficiency anemia, unspecified: Secondary | ICD-10-CM | POA: Diagnosis not present

## 2020-08-03 DIAGNOSIS — D631 Anemia in chronic kidney disease: Secondary | ICD-10-CM | POA: Diagnosis not present

## 2020-08-03 DIAGNOSIS — E1122 Type 2 diabetes mellitus with diabetic chronic kidney disease: Secondary | ICD-10-CM | POA: Diagnosis not present

## 2020-08-03 DIAGNOSIS — Z992 Dependence on renal dialysis: Secondary | ICD-10-CM | POA: Diagnosis not present

## 2020-08-03 DIAGNOSIS — N2581 Secondary hyperparathyroidism of renal origin: Secondary | ICD-10-CM | POA: Diagnosis not present

## 2020-08-06 DIAGNOSIS — Z992 Dependence on renal dialysis: Secondary | ICD-10-CM | POA: Diagnosis not present

## 2020-08-06 DIAGNOSIS — N186 End stage renal disease: Secondary | ICD-10-CM | POA: Diagnosis not present

## 2020-08-06 DIAGNOSIS — D509 Iron deficiency anemia, unspecified: Secondary | ICD-10-CM | POA: Diagnosis not present

## 2020-08-06 DIAGNOSIS — D631 Anemia in chronic kidney disease: Secondary | ICD-10-CM | POA: Diagnosis not present

## 2020-08-06 DIAGNOSIS — N2581 Secondary hyperparathyroidism of renal origin: Secondary | ICD-10-CM | POA: Diagnosis not present

## 2020-08-06 DIAGNOSIS — E1122 Type 2 diabetes mellitus with diabetic chronic kidney disease: Secondary | ICD-10-CM | POA: Diagnosis not present

## 2020-08-08 ENCOUNTER — Other Ambulatory Visit: Payer: Self-pay | Admitting: Cardiovascular Disease

## 2020-08-08 DIAGNOSIS — D631 Anemia in chronic kidney disease: Secondary | ICD-10-CM | POA: Diagnosis not present

## 2020-08-08 DIAGNOSIS — N2581 Secondary hyperparathyroidism of renal origin: Secondary | ICD-10-CM | POA: Diagnosis not present

## 2020-08-08 DIAGNOSIS — D509 Iron deficiency anemia, unspecified: Secondary | ICD-10-CM | POA: Diagnosis not present

## 2020-08-08 DIAGNOSIS — N186 End stage renal disease: Secondary | ICD-10-CM | POA: Diagnosis not present

## 2020-08-08 DIAGNOSIS — E1122 Type 2 diabetes mellitus with diabetic chronic kidney disease: Secondary | ICD-10-CM | POA: Diagnosis not present

## 2020-08-08 DIAGNOSIS — Z992 Dependence on renal dialysis: Secondary | ICD-10-CM | POA: Diagnosis not present

## 2020-08-10 DIAGNOSIS — N186 End stage renal disease: Secondary | ICD-10-CM | POA: Diagnosis not present

## 2020-08-10 DIAGNOSIS — D631 Anemia in chronic kidney disease: Secondary | ICD-10-CM | POA: Diagnosis not present

## 2020-08-10 DIAGNOSIS — D509 Iron deficiency anemia, unspecified: Secondary | ICD-10-CM | POA: Diagnosis not present

## 2020-08-10 DIAGNOSIS — Z992 Dependence on renal dialysis: Secondary | ICD-10-CM | POA: Diagnosis not present

## 2020-08-10 DIAGNOSIS — N2581 Secondary hyperparathyroidism of renal origin: Secondary | ICD-10-CM | POA: Diagnosis not present

## 2020-08-10 DIAGNOSIS — E1122 Type 2 diabetes mellitus with diabetic chronic kidney disease: Secondary | ICD-10-CM | POA: Diagnosis not present

## 2020-08-13 DIAGNOSIS — D509 Iron deficiency anemia, unspecified: Secondary | ICD-10-CM | POA: Diagnosis not present

## 2020-08-13 DIAGNOSIS — E1122 Type 2 diabetes mellitus with diabetic chronic kidney disease: Secondary | ICD-10-CM | POA: Diagnosis not present

## 2020-08-13 DIAGNOSIS — D631 Anemia in chronic kidney disease: Secondary | ICD-10-CM | POA: Diagnosis not present

## 2020-08-13 DIAGNOSIS — N186 End stage renal disease: Secondary | ICD-10-CM | POA: Diagnosis not present

## 2020-08-13 DIAGNOSIS — N2581 Secondary hyperparathyroidism of renal origin: Secondary | ICD-10-CM | POA: Diagnosis not present

## 2020-08-13 DIAGNOSIS — Z992 Dependence on renal dialysis: Secondary | ICD-10-CM | POA: Diagnosis not present

## 2020-08-15 DIAGNOSIS — Z992 Dependence on renal dialysis: Secondary | ICD-10-CM | POA: Diagnosis not present

## 2020-08-15 DIAGNOSIS — D631 Anemia in chronic kidney disease: Secondary | ICD-10-CM | POA: Diagnosis not present

## 2020-08-15 DIAGNOSIS — N186 End stage renal disease: Secondary | ICD-10-CM | POA: Diagnosis not present

## 2020-08-15 DIAGNOSIS — E1122 Type 2 diabetes mellitus with diabetic chronic kidney disease: Secondary | ICD-10-CM | POA: Diagnosis not present

## 2020-08-15 DIAGNOSIS — N2581 Secondary hyperparathyroidism of renal origin: Secondary | ICD-10-CM | POA: Diagnosis not present

## 2020-08-15 DIAGNOSIS — D509 Iron deficiency anemia, unspecified: Secondary | ICD-10-CM | POA: Diagnosis not present

## 2020-08-16 DIAGNOSIS — I129 Hypertensive chronic kidney disease with stage 1 through stage 4 chronic kidney disease, or unspecified chronic kidney disease: Secondary | ICD-10-CM | POA: Diagnosis not present

## 2020-08-16 DIAGNOSIS — Z992 Dependence on renal dialysis: Secondary | ICD-10-CM | POA: Diagnosis not present

## 2020-08-16 DIAGNOSIS — N186 End stage renal disease: Secondary | ICD-10-CM | POA: Diagnosis not present

## 2020-08-17 DIAGNOSIS — N186 End stage renal disease: Secondary | ICD-10-CM | POA: Diagnosis not present

## 2020-08-17 DIAGNOSIS — D631 Anemia in chronic kidney disease: Secondary | ICD-10-CM | POA: Diagnosis not present

## 2020-08-17 DIAGNOSIS — N2581 Secondary hyperparathyroidism of renal origin: Secondary | ICD-10-CM | POA: Diagnosis not present

## 2020-08-17 DIAGNOSIS — Z992 Dependence on renal dialysis: Secondary | ICD-10-CM | POA: Diagnosis not present

## 2020-08-17 DIAGNOSIS — D509 Iron deficiency anemia, unspecified: Secondary | ICD-10-CM | POA: Diagnosis not present

## 2020-08-17 DIAGNOSIS — E1122 Type 2 diabetes mellitus with diabetic chronic kidney disease: Secondary | ICD-10-CM | POA: Diagnosis not present

## 2020-08-20 DIAGNOSIS — D509 Iron deficiency anemia, unspecified: Secondary | ICD-10-CM | POA: Diagnosis not present

## 2020-08-20 DIAGNOSIS — E1122 Type 2 diabetes mellitus with diabetic chronic kidney disease: Secondary | ICD-10-CM | POA: Diagnosis not present

## 2020-08-20 DIAGNOSIS — N186 End stage renal disease: Secondary | ICD-10-CM | POA: Diagnosis not present

## 2020-08-20 DIAGNOSIS — Z992 Dependence on renal dialysis: Secondary | ICD-10-CM | POA: Diagnosis not present

## 2020-08-20 DIAGNOSIS — N2581 Secondary hyperparathyroidism of renal origin: Secondary | ICD-10-CM | POA: Diagnosis not present

## 2020-08-20 DIAGNOSIS — D631 Anemia in chronic kidney disease: Secondary | ICD-10-CM | POA: Diagnosis not present

## 2020-08-22 DIAGNOSIS — N2581 Secondary hyperparathyroidism of renal origin: Secondary | ICD-10-CM | POA: Diagnosis not present

## 2020-08-22 DIAGNOSIS — N186 End stage renal disease: Secondary | ICD-10-CM | POA: Diagnosis not present

## 2020-08-22 DIAGNOSIS — Z992 Dependence on renal dialysis: Secondary | ICD-10-CM | POA: Diagnosis not present

## 2020-08-22 DIAGNOSIS — D631 Anemia in chronic kidney disease: Secondary | ICD-10-CM | POA: Diagnosis not present

## 2020-08-22 DIAGNOSIS — D509 Iron deficiency anemia, unspecified: Secondary | ICD-10-CM | POA: Diagnosis not present

## 2020-08-22 DIAGNOSIS — E1122 Type 2 diabetes mellitus with diabetic chronic kidney disease: Secondary | ICD-10-CM | POA: Diagnosis not present

## 2020-08-24 DIAGNOSIS — D631 Anemia in chronic kidney disease: Secondary | ICD-10-CM | POA: Diagnosis not present

## 2020-08-24 DIAGNOSIS — N2581 Secondary hyperparathyroidism of renal origin: Secondary | ICD-10-CM | POA: Diagnosis not present

## 2020-08-24 DIAGNOSIS — E1122 Type 2 diabetes mellitus with diabetic chronic kidney disease: Secondary | ICD-10-CM | POA: Diagnosis not present

## 2020-08-24 DIAGNOSIS — Z992 Dependence on renal dialysis: Secondary | ICD-10-CM | POA: Diagnosis not present

## 2020-08-24 DIAGNOSIS — D509 Iron deficiency anemia, unspecified: Secondary | ICD-10-CM | POA: Diagnosis not present

## 2020-08-24 DIAGNOSIS — N186 End stage renal disease: Secondary | ICD-10-CM | POA: Diagnosis not present

## 2020-08-27 DIAGNOSIS — N2581 Secondary hyperparathyroidism of renal origin: Secondary | ICD-10-CM | POA: Diagnosis not present

## 2020-08-27 DIAGNOSIS — Z992 Dependence on renal dialysis: Secondary | ICD-10-CM | POA: Diagnosis not present

## 2020-08-27 DIAGNOSIS — D509 Iron deficiency anemia, unspecified: Secondary | ICD-10-CM | POA: Diagnosis not present

## 2020-08-27 DIAGNOSIS — E1122 Type 2 diabetes mellitus with diabetic chronic kidney disease: Secondary | ICD-10-CM | POA: Diagnosis not present

## 2020-08-27 DIAGNOSIS — N186 End stage renal disease: Secondary | ICD-10-CM | POA: Diagnosis not present

## 2020-08-27 DIAGNOSIS — D631 Anemia in chronic kidney disease: Secondary | ICD-10-CM | POA: Diagnosis not present

## 2020-08-29 DIAGNOSIS — Z992 Dependence on renal dialysis: Secondary | ICD-10-CM | POA: Diagnosis not present

## 2020-08-29 DIAGNOSIS — D631 Anemia in chronic kidney disease: Secondary | ICD-10-CM | POA: Diagnosis not present

## 2020-08-29 DIAGNOSIS — D509 Iron deficiency anemia, unspecified: Secondary | ICD-10-CM | POA: Diagnosis not present

## 2020-08-29 DIAGNOSIS — E1122 Type 2 diabetes mellitus with diabetic chronic kidney disease: Secondary | ICD-10-CM | POA: Diagnosis not present

## 2020-08-29 DIAGNOSIS — N186 End stage renal disease: Secondary | ICD-10-CM | POA: Diagnosis not present

## 2020-08-29 DIAGNOSIS — N2581 Secondary hyperparathyroidism of renal origin: Secondary | ICD-10-CM | POA: Diagnosis not present

## 2020-08-30 ENCOUNTER — Ambulatory Visit (INDEPENDENT_AMBULATORY_CARE_PROVIDER_SITE_OTHER): Payer: Medicare Other

## 2020-08-30 DIAGNOSIS — I495 Sick sinus syndrome: Secondary | ICD-10-CM

## 2020-08-31 DIAGNOSIS — D631 Anemia in chronic kidney disease: Secondary | ICD-10-CM | POA: Diagnosis not present

## 2020-08-31 DIAGNOSIS — D509 Iron deficiency anemia, unspecified: Secondary | ICD-10-CM | POA: Diagnosis not present

## 2020-08-31 DIAGNOSIS — N186 End stage renal disease: Secondary | ICD-10-CM | POA: Diagnosis not present

## 2020-08-31 DIAGNOSIS — Z992 Dependence on renal dialysis: Secondary | ICD-10-CM | POA: Diagnosis not present

## 2020-08-31 DIAGNOSIS — E1122 Type 2 diabetes mellitus with diabetic chronic kidney disease: Secondary | ICD-10-CM | POA: Diagnosis not present

## 2020-08-31 DIAGNOSIS — N2581 Secondary hyperparathyroidism of renal origin: Secondary | ICD-10-CM | POA: Diagnosis not present

## 2020-09-03 DIAGNOSIS — N186 End stage renal disease: Secondary | ICD-10-CM | POA: Diagnosis not present

## 2020-09-03 DIAGNOSIS — N2581 Secondary hyperparathyroidism of renal origin: Secondary | ICD-10-CM | POA: Diagnosis not present

## 2020-09-03 DIAGNOSIS — D631 Anemia in chronic kidney disease: Secondary | ICD-10-CM | POA: Diagnosis not present

## 2020-09-03 DIAGNOSIS — D509 Iron deficiency anemia, unspecified: Secondary | ICD-10-CM | POA: Diagnosis not present

## 2020-09-03 DIAGNOSIS — Z992 Dependence on renal dialysis: Secondary | ICD-10-CM | POA: Diagnosis not present

## 2020-09-03 DIAGNOSIS — E1122 Type 2 diabetes mellitus with diabetic chronic kidney disease: Secondary | ICD-10-CM | POA: Diagnosis not present

## 2020-09-03 LAB — CUP PACEART REMOTE DEVICE CHECK
Battery Remaining Longevity: 127 mo
Battery Voltage: 3.04 V
Brady Statistic AP VP Percent: 0.01 %
Brady Statistic AP VS Percent: 11.48 %
Brady Statistic AS VP Percent: 0.04 %
Brady Statistic AS VS Percent: 88.47 %
Brady Statistic RA Percent Paced: 11.52 %
Brady Statistic RV Percent Paced: 0.05 %
Date Time Interrogation Session: 20220715052756
Implantable Lead Implant Date: 20020325
Implantable Lead Implant Date: 20020325
Implantable Lead Location: 753859
Implantable Lead Location: 753860
Implantable Lead Model: 4092
Implantable Lead Model: 4524
Implantable Pulse Generator Implant Date: 20210415
Lead Channel Impedance Value: 266 Ohm
Lead Channel Impedance Value: 285 Ohm
Lead Channel Impedance Value: 456 Ohm
Lead Channel Impedance Value: 551 Ohm
Lead Channel Pacing Threshold Amplitude: 0.875 V
Lead Channel Pacing Threshold Amplitude: 2.5 V
Lead Channel Pacing Threshold Pulse Width: 0.4 ms
Lead Channel Pacing Threshold Pulse Width: 0.4 ms
Lead Channel Sensing Intrinsic Amplitude: 0.75 mV
Lead Channel Sensing Intrinsic Amplitude: 0.75 mV
Lead Channel Sensing Intrinsic Amplitude: 24.875 mV
Lead Channel Sensing Intrinsic Amplitude: 24.875 mV
Lead Channel Setting Pacing Amplitude: 2.5 V
Lead Channel Setting Pacing Amplitude: 3 V
Lead Channel Setting Pacing Pulse Width: 0.4 ms
Lead Channel Setting Sensing Sensitivity: 1.2 mV

## 2020-09-05 DIAGNOSIS — E039 Hypothyroidism, unspecified: Secondary | ICD-10-CM | POA: Diagnosis not present

## 2020-09-05 DIAGNOSIS — E1122 Type 2 diabetes mellitus with diabetic chronic kidney disease: Secondary | ICD-10-CM | POA: Diagnosis not present

## 2020-09-05 DIAGNOSIS — Z992 Dependence on renal dialysis: Secondary | ICD-10-CM | POA: Diagnosis not present

## 2020-09-05 DIAGNOSIS — D509 Iron deficiency anemia, unspecified: Secondary | ICD-10-CM | POA: Diagnosis not present

## 2020-09-05 DIAGNOSIS — N186 End stage renal disease: Secondary | ICD-10-CM | POA: Diagnosis not present

## 2020-09-05 DIAGNOSIS — N2581 Secondary hyperparathyroidism of renal origin: Secondary | ICD-10-CM | POA: Diagnosis not present

## 2020-09-05 DIAGNOSIS — D631 Anemia in chronic kidney disease: Secondary | ICD-10-CM | POA: Diagnosis not present

## 2020-09-10 DIAGNOSIS — N2581 Secondary hyperparathyroidism of renal origin: Secondary | ICD-10-CM | POA: Diagnosis not present

## 2020-09-10 DIAGNOSIS — N186 End stage renal disease: Secondary | ICD-10-CM | POA: Diagnosis not present

## 2020-09-10 DIAGNOSIS — D509 Iron deficiency anemia, unspecified: Secondary | ICD-10-CM | POA: Diagnosis not present

## 2020-09-10 DIAGNOSIS — Z992 Dependence on renal dialysis: Secondary | ICD-10-CM | POA: Diagnosis not present

## 2020-09-10 DIAGNOSIS — E1122 Type 2 diabetes mellitus with diabetic chronic kidney disease: Secondary | ICD-10-CM | POA: Diagnosis not present

## 2020-09-10 DIAGNOSIS — D631 Anemia in chronic kidney disease: Secondary | ICD-10-CM | POA: Diagnosis not present

## 2020-09-12 DIAGNOSIS — N2581 Secondary hyperparathyroidism of renal origin: Secondary | ICD-10-CM | POA: Diagnosis not present

## 2020-09-12 DIAGNOSIS — Z992 Dependence on renal dialysis: Secondary | ICD-10-CM | POA: Diagnosis not present

## 2020-09-12 DIAGNOSIS — D509 Iron deficiency anemia, unspecified: Secondary | ICD-10-CM | POA: Diagnosis not present

## 2020-09-12 DIAGNOSIS — N186 End stage renal disease: Secondary | ICD-10-CM | POA: Diagnosis not present

## 2020-09-12 DIAGNOSIS — D631 Anemia in chronic kidney disease: Secondary | ICD-10-CM | POA: Diagnosis not present

## 2020-09-12 DIAGNOSIS — E1122 Type 2 diabetes mellitus with diabetic chronic kidney disease: Secondary | ICD-10-CM | POA: Diagnosis not present

## 2020-09-14 DIAGNOSIS — Z992 Dependence on renal dialysis: Secondary | ICD-10-CM | POA: Diagnosis not present

## 2020-09-14 DIAGNOSIS — D509 Iron deficiency anemia, unspecified: Secondary | ICD-10-CM | POA: Diagnosis not present

## 2020-09-14 DIAGNOSIS — E1122 Type 2 diabetes mellitus with diabetic chronic kidney disease: Secondary | ICD-10-CM | POA: Diagnosis not present

## 2020-09-14 DIAGNOSIS — D631 Anemia in chronic kidney disease: Secondary | ICD-10-CM | POA: Diagnosis not present

## 2020-09-14 DIAGNOSIS — N2581 Secondary hyperparathyroidism of renal origin: Secondary | ICD-10-CM | POA: Diagnosis not present

## 2020-09-14 DIAGNOSIS — N186 End stage renal disease: Secondary | ICD-10-CM | POA: Diagnosis not present

## 2020-09-16 DIAGNOSIS — I129 Hypertensive chronic kidney disease with stage 1 through stage 4 chronic kidney disease, or unspecified chronic kidney disease: Secondary | ICD-10-CM | POA: Diagnosis not present

## 2020-09-16 DIAGNOSIS — Z992 Dependence on renal dialysis: Secondary | ICD-10-CM | POA: Diagnosis not present

## 2020-09-16 DIAGNOSIS — N186 End stage renal disease: Secondary | ICD-10-CM | POA: Diagnosis not present

## 2020-09-17 DIAGNOSIS — D631 Anemia in chronic kidney disease: Secondary | ICD-10-CM | POA: Diagnosis not present

## 2020-09-17 DIAGNOSIS — N2581 Secondary hyperparathyroidism of renal origin: Secondary | ICD-10-CM | POA: Diagnosis not present

## 2020-09-17 DIAGNOSIS — D509 Iron deficiency anemia, unspecified: Secondary | ICD-10-CM | POA: Diagnosis not present

## 2020-09-17 DIAGNOSIS — E1122 Type 2 diabetes mellitus with diabetic chronic kidney disease: Secondary | ICD-10-CM | POA: Diagnosis not present

## 2020-09-17 DIAGNOSIS — Z992 Dependence on renal dialysis: Secondary | ICD-10-CM | POA: Diagnosis not present

## 2020-09-17 DIAGNOSIS — N186 End stage renal disease: Secondary | ICD-10-CM | POA: Diagnosis not present

## 2020-09-19 DIAGNOSIS — D509 Iron deficiency anemia, unspecified: Secondary | ICD-10-CM | POA: Diagnosis not present

## 2020-09-19 DIAGNOSIS — D631 Anemia in chronic kidney disease: Secondary | ICD-10-CM | POA: Diagnosis not present

## 2020-09-19 DIAGNOSIS — N186 End stage renal disease: Secondary | ICD-10-CM | POA: Diagnosis not present

## 2020-09-19 DIAGNOSIS — Z992 Dependence on renal dialysis: Secondary | ICD-10-CM | POA: Diagnosis not present

## 2020-09-19 DIAGNOSIS — E1122 Type 2 diabetes mellitus with diabetic chronic kidney disease: Secondary | ICD-10-CM | POA: Diagnosis not present

## 2020-09-19 DIAGNOSIS — N2581 Secondary hyperparathyroidism of renal origin: Secondary | ICD-10-CM | POA: Diagnosis not present

## 2020-09-21 DIAGNOSIS — D631 Anemia in chronic kidney disease: Secondary | ICD-10-CM | POA: Diagnosis not present

## 2020-09-21 DIAGNOSIS — E1122 Type 2 diabetes mellitus with diabetic chronic kidney disease: Secondary | ICD-10-CM | POA: Diagnosis not present

## 2020-09-21 DIAGNOSIS — D509 Iron deficiency anemia, unspecified: Secondary | ICD-10-CM | POA: Diagnosis not present

## 2020-09-21 DIAGNOSIS — N186 End stage renal disease: Secondary | ICD-10-CM | POA: Diagnosis not present

## 2020-09-21 DIAGNOSIS — N2581 Secondary hyperparathyroidism of renal origin: Secondary | ICD-10-CM | POA: Diagnosis not present

## 2020-09-21 DIAGNOSIS — Z992 Dependence on renal dialysis: Secondary | ICD-10-CM | POA: Diagnosis not present

## 2020-09-23 NOTE — Progress Notes (Signed)
Remote pacemaker transmission.   

## 2020-09-24 DIAGNOSIS — D509 Iron deficiency anemia, unspecified: Secondary | ICD-10-CM | POA: Diagnosis not present

## 2020-09-24 DIAGNOSIS — N186 End stage renal disease: Secondary | ICD-10-CM | POA: Diagnosis not present

## 2020-09-24 DIAGNOSIS — E1122 Type 2 diabetes mellitus with diabetic chronic kidney disease: Secondary | ICD-10-CM | POA: Diagnosis not present

## 2020-09-24 DIAGNOSIS — D631 Anemia in chronic kidney disease: Secondary | ICD-10-CM | POA: Diagnosis not present

## 2020-09-24 DIAGNOSIS — N2581 Secondary hyperparathyroidism of renal origin: Secondary | ICD-10-CM | POA: Diagnosis not present

## 2020-09-24 DIAGNOSIS — Z992 Dependence on renal dialysis: Secondary | ICD-10-CM | POA: Diagnosis not present

## 2020-09-26 DIAGNOSIS — Z992 Dependence on renal dialysis: Secondary | ICD-10-CM | POA: Diagnosis not present

## 2020-09-26 DIAGNOSIS — D509 Iron deficiency anemia, unspecified: Secondary | ICD-10-CM | POA: Diagnosis not present

## 2020-09-26 DIAGNOSIS — N186 End stage renal disease: Secondary | ICD-10-CM | POA: Diagnosis not present

## 2020-09-26 DIAGNOSIS — N2581 Secondary hyperparathyroidism of renal origin: Secondary | ICD-10-CM | POA: Diagnosis not present

## 2020-09-26 DIAGNOSIS — D631 Anemia in chronic kidney disease: Secondary | ICD-10-CM | POA: Diagnosis not present

## 2020-09-26 DIAGNOSIS — E1122 Type 2 diabetes mellitus with diabetic chronic kidney disease: Secondary | ICD-10-CM | POA: Diagnosis not present

## 2020-09-28 DIAGNOSIS — D631 Anemia in chronic kidney disease: Secondary | ICD-10-CM | POA: Diagnosis not present

## 2020-09-28 DIAGNOSIS — N186 End stage renal disease: Secondary | ICD-10-CM | POA: Diagnosis not present

## 2020-09-28 DIAGNOSIS — N2581 Secondary hyperparathyroidism of renal origin: Secondary | ICD-10-CM | POA: Diagnosis not present

## 2020-09-28 DIAGNOSIS — Z992 Dependence on renal dialysis: Secondary | ICD-10-CM | POA: Diagnosis not present

## 2020-09-28 DIAGNOSIS — E1122 Type 2 diabetes mellitus with diabetic chronic kidney disease: Secondary | ICD-10-CM | POA: Diagnosis not present

## 2020-09-28 DIAGNOSIS — D509 Iron deficiency anemia, unspecified: Secondary | ICD-10-CM | POA: Diagnosis not present

## 2020-10-01 DIAGNOSIS — D631 Anemia in chronic kidney disease: Secondary | ICD-10-CM | POA: Diagnosis not present

## 2020-10-01 DIAGNOSIS — N2581 Secondary hyperparathyroidism of renal origin: Secondary | ICD-10-CM | POA: Diagnosis not present

## 2020-10-01 DIAGNOSIS — Z992 Dependence on renal dialysis: Secondary | ICD-10-CM | POA: Diagnosis not present

## 2020-10-01 DIAGNOSIS — D509 Iron deficiency anemia, unspecified: Secondary | ICD-10-CM | POA: Diagnosis not present

## 2020-10-01 DIAGNOSIS — N186 End stage renal disease: Secondary | ICD-10-CM | POA: Diagnosis not present

## 2020-10-01 DIAGNOSIS — E1122 Type 2 diabetes mellitus with diabetic chronic kidney disease: Secondary | ICD-10-CM | POA: Diagnosis not present

## 2020-10-02 DIAGNOSIS — R059 Cough, unspecified: Secondary | ICD-10-CM | POA: Diagnosis not present

## 2020-10-02 DIAGNOSIS — Z1152 Encounter for screening for COVID-19: Secondary | ICD-10-CM | POA: Diagnosis not present

## 2020-10-02 DIAGNOSIS — J9811 Atelectasis: Secondary | ICD-10-CM | POA: Diagnosis not present

## 2020-10-02 DIAGNOSIS — Z992 Dependence on renal dialysis: Secondary | ICD-10-CM | POA: Diagnosis not present

## 2020-10-02 DIAGNOSIS — R5383 Other fatigue: Secondary | ICD-10-CM | POA: Diagnosis not present

## 2020-10-02 DIAGNOSIS — R0981 Nasal congestion: Secondary | ICD-10-CM | POA: Diagnosis not present

## 2020-10-02 DIAGNOSIS — G4733 Obstructive sleep apnea (adult) (pediatric): Secondary | ICD-10-CM | POA: Diagnosis not present

## 2020-10-03 DIAGNOSIS — E1122 Type 2 diabetes mellitus with diabetic chronic kidney disease: Secondary | ICD-10-CM | POA: Diagnosis not present

## 2020-10-03 DIAGNOSIS — D631 Anemia in chronic kidney disease: Secondary | ICD-10-CM | POA: Diagnosis not present

## 2020-10-03 DIAGNOSIS — Z992 Dependence on renal dialysis: Secondary | ICD-10-CM | POA: Diagnosis not present

## 2020-10-03 DIAGNOSIS — N2581 Secondary hyperparathyroidism of renal origin: Secondary | ICD-10-CM | POA: Diagnosis not present

## 2020-10-03 DIAGNOSIS — N186 End stage renal disease: Secondary | ICD-10-CM | POA: Diagnosis not present

## 2020-10-03 DIAGNOSIS — D509 Iron deficiency anemia, unspecified: Secondary | ICD-10-CM | POA: Diagnosis not present

## 2020-10-05 DIAGNOSIS — D631 Anemia in chronic kidney disease: Secondary | ICD-10-CM | POA: Diagnosis not present

## 2020-10-05 DIAGNOSIS — D509 Iron deficiency anemia, unspecified: Secondary | ICD-10-CM | POA: Diagnosis not present

## 2020-10-05 DIAGNOSIS — N2581 Secondary hyperparathyroidism of renal origin: Secondary | ICD-10-CM | POA: Diagnosis not present

## 2020-10-05 DIAGNOSIS — Z992 Dependence on renal dialysis: Secondary | ICD-10-CM | POA: Diagnosis not present

## 2020-10-05 DIAGNOSIS — E1122 Type 2 diabetes mellitus with diabetic chronic kidney disease: Secondary | ICD-10-CM | POA: Diagnosis not present

## 2020-10-05 DIAGNOSIS — N186 End stage renal disease: Secondary | ICD-10-CM | POA: Diagnosis not present

## 2020-10-08 DIAGNOSIS — D509 Iron deficiency anemia, unspecified: Secondary | ICD-10-CM | POA: Diagnosis not present

## 2020-10-08 DIAGNOSIS — D631 Anemia in chronic kidney disease: Secondary | ICD-10-CM | POA: Diagnosis not present

## 2020-10-08 DIAGNOSIS — N186 End stage renal disease: Secondary | ICD-10-CM | POA: Diagnosis not present

## 2020-10-08 DIAGNOSIS — E1122 Type 2 diabetes mellitus with diabetic chronic kidney disease: Secondary | ICD-10-CM | POA: Diagnosis not present

## 2020-10-08 DIAGNOSIS — N2581 Secondary hyperparathyroidism of renal origin: Secondary | ICD-10-CM | POA: Diagnosis not present

## 2020-10-08 DIAGNOSIS — Z992 Dependence on renal dialysis: Secondary | ICD-10-CM | POA: Diagnosis not present

## 2020-10-10 DIAGNOSIS — D509 Iron deficiency anemia, unspecified: Secondary | ICD-10-CM | POA: Diagnosis not present

## 2020-10-10 DIAGNOSIS — N186 End stage renal disease: Secondary | ICD-10-CM | POA: Diagnosis not present

## 2020-10-10 DIAGNOSIS — N2581 Secondary hyperparathyroidism of renal origin: Secondary | ICD-10-CM | POA: Diagnosis not present

## 2020-10-10 DIAGNOSIS — E1122 Type 2 diabetes mellitus with diabetic chronic kidney disease: Secondary | ICD-10-CM | POA: Diagnosis not present

## 2020-10-10 DIAGNOSIS — Z992 Dependence on renal dialysis: Secondary | ICD-10-CM | POA: Diagnosis not present

## 2020-10-10 DIAGNOSIS — D631 Anemia in chronic kidney disease: Secondary | ICD-10-CM | POA: Diagnosis not present

## 2020-10-12 DIAGNOSIS — N2581 Secondary hyperparathyroidism of renal origin: Secondary | ICD-10-CM | POA: Diagnosis not present

## 2020-10-12 DIAGNOSIS — E1122 Type 2 diabetes mellitus with diabetic chronic kidney disease: Secondary | ICD-10-CM | POA: Diagnosis not present

## 2020-10-12 DIAGNOSIS — N186 End stage renal disease: Secondary | ICD-10-CM | POA: Diagnosis not present

## 2020-10-12 DIAGNOSIS — D509 Iron deficiency anemia, unspecified: Secondary | ICD-10-CM | POA: Diagnosis not present

## 2020-10-12 DIAGNOSIS — D631 Anemia in chronic kidney disease: Secondary | ICD-10-CM | POA: Diagnosis not present

## 2020-10-12 DIAGNOSIS — Z992 Dependence on renal dialysis: Secondary | ICD-10-CM | POA: Diagnosis not present

## 2020-10-15 DIAGNOSIS — D509 Iron deficiency anemia, unspecified: Secondary | ICD-10-CM | POA: Diagnosis not present

## 2020-10-15 DIAGNOSIS — Z992 Dependence on renal dialysis: Secondary | ICD-10-CM | POA: Diagnosis not present

## 2020-10-15 DIAGNOSIS — E1122 Type 2 diabetes mellitus with diabetic chronic kidney disease: Secondary | ICD-10-CM | POA: Diagnosis not present

## 2020-10-15 DIAGNOSIS — D631 Anemia in chronic kidney disease: Secondary | ICD-10-CM | POA: Diagnosis not present

## 2020-10-15 DIAGNOSIS — N186 End stage renal disease: Secondary | ICD-10-CM | POA: Diagnosis not present

## 2020-10-15 DIAGNOSIS — N2581 Secondary hyperparathyroidism of renal origin: Secondary | ICD-10-CM | POA: Diagnosis not present

## 2020-10-17 DIAGNOSIS — I129 Hypertensive chronic kidney disease with stage 1 through stage 4 chronic kidney disease, or unspecified chronic kidney disease: Secondary | ICD-10-CM | POA: Diagnosis not present

## 2020-10-17 DIAGNOSIS — N2581 Secondary hyperparathyroidism of renal origin: Secondary | ICD-10-CM | POA: Diagnosis not present

## 2020-10-17 DIAGNOSIS — E1122 Type 2 diabetes mellitus with diabetic chronic kidney disease: Secondary | ICD-10-CM | POA: Diagnosis not present

## 2020-10-17 DIAGNOSIS — N186 End stage renal disease: Secondary | ICD-10-CM | POA: Diagnosis not present

## 2020-10-17 DIAGNOSIS — D631 Anemia in chronic kidney disease: Secondary | ICD-10-CM | POA: Diagnosis not present

## 2020-10-17 DIAGNOSIS — Z992 Dependence on renal dialysis: Secondary | ICD-10-CM | POA: Diagnosis not present

## 2020-10-19 DIAGNOSIS — N2581 Secondary hyperparathyroidism of renal origin: Secondary | ICD-10-CM | POA: Diagnosis not present

## 2020-10-19 DIAGNOSIS — D631 Anemia in chronic kidney disease: Secondary | ICD-10-CM | POA: Diagnosis not present

## 2020-10-19 DIAGNOSIS — E1122 Type 2 diabetes mellitus with diabetic chronic kidney disease: Secondary | ICD-10-CM | POA: Diagnosis not present

## 2020-10-19 DIAGNOSIS — N186 End stage renal disease: Secondary | ICD-10-CM | POA: Diagnosis not present

## 2020-10-19 DIAGNOSIS — Z992 Dependence on renal dialysis: Secondary | ICD-10-CM | POA: Diagnosis not present

## 2020-10-22 DIAGNOSIS — E1122 Type 2 diabetes mellitus with diabetic chronic kidney disease: Secondary | ICD-10-CM | POA: Diagnosis not present

## 2020-10-22 DIAGNOSIS — Z992 Dependence on renal dialysis: Secondary | ICD-10-CM | POA: Diagnosis not present

## 2020-10-22 DIAGNOSIS — N2581 Secondary hyperparathyroidism of renal origin: Secondary | ICD-10-CM | POA: Diagnosis not present

## 2020-10-22 DIAGNOSIS — D631 Anemia in chronic kidney disease: Secondary | ICD-10-CM | POA: Diagnosis not present

## 2020-10-22 DIAGNOSIS — N186 End stage renal disease: Secondary | ICD-10-CM | POA: Diagnosis not present

## 2020-10-24 DIAGNOSIS — E1122 Type 2 diabetes mellitus with diabetic chronic kidney disease: Secondary | ICD-10-CM | POA: Diagnosis not present

## 2020-10-24 DIAGNOSIS — N186 End stage renal disease: Secondary | ICD-10-CM | POA: Diagnosis not present

## 2020-10-24 DIAGNOSIS — D631 Anemia in chronic kidney disease: Secondary | ICD-10-CM | POA: Diagnosis not present

## 2020-10-24 DIAGNOSIS — Z992 Dependence on renal dialysis: Secondary | ICD-10-CM | POA: Diagnosis not present

## 2020-10-24 DIAGNOSIS — N2581 Secondary hyperparathyroidism of renal origin: Secondary | ICD-10-CM | POA: Diagnosis not present

## 2020-10-26 DIAGNOSIS — Z992 Dependence on renal dialysis: Secondary | ICD-10-CM | POA: Diagnosis not present

## 2020-10-26 DIAGNOSIS — D631 Anemia in chronic kidney disease: Secondary | ICD-10-CM | POA: Diagnosis not present

## 2020-10-26 DIAGNOSIS — N186 End stage renal disease: Secondary | ICD-10-CM | POA: Diagnosis not present

## 2020-10-26 DIAGNOSIS — N2581 Secondary hyperparathyroidism of renal origin: Secondary | ICD-10-CM | POA: Diagnosis not present

## 2020-10-26 DIAGNOSIS — E1122 Type 2 diabetes mellitus with diabetic chronic kidney disease: Secondary | ICD-10-CM | POA: Diagnosis not present

## 2020-10-28 DIAGNOSIS — I5032 Chronic diastolic (congestive) heart failure: Secondary | ICD-10-CM | POA: Diagnosis not present

## 2020-10-28 DIAGNOSIS — Z794 Long term (current) use of insulin: Secondary | ICD-10-CM | POA: Diagnosis not present

## 2020-10-28 DIAGNOSIS — Z992 Dependence on renal dialysis: Secondary | ICD-10-CM | POA: Diagnosis not present

## 2020-10-28 DIAGNOSIS — Z23 Encounter for immunization: Secondary | ICD-10-CM | POA: Diagnosis not present

## 2020-10-28 DIAGNOSIS — N186 End stage renal disease: Secondary | ICD-10-CM | POA: Diagnosis not present

## 2020-10-28 DIAGNOSIS — R0981 Nasal congestion: Secondary | ICD-10-CM | POA: Diagnosis not present

## 2020-10-28 DIAGNOSIS — J9811 Atelectasis: Secondary | ICD-10-CM | POA: Diagnosis not present

## 2020-10-28 DIAGNOSIS — E1169 Type 2 diabetes mellitus with other specified complication: Secondary | ICD-10-CM | POA: Diagnosis not present

## 2020-10-29 DIAGNOSIS — E1122 Type 2 diabetes mellitus with diabetic chronic kidney disease: Secondary | ICD-10-CM | POA: Diagnosis not present

## 2020-10-29 DIAGNOSIS — Z992 Dependence on renal dialysis: Secondary | ICD-10-CM | POA: Diagnosis not present

## 2020-10-29 DIAGNOSIS — D631 Anemia in chronic kidney disease: Secondary | ICD-10-CM | POA: Diagnosis not present

## 2020-10-29 DIAGNOSIS — N186 End stage renal disease: Secondary | ICD-10-CM | POA: Diagnosis not present

## 2020-10-29 DIAGNOSIS — N2581 Secondary hyperparathyroidism of renal origin: Secondary | ICD-10-CM | POA: Diagnosis not present

## 2020-10-31 DIAGNOSIS — D631 Anemia in chronic kidney disease: Secondary | ICD-10-CM | POA: Diagnosis not present

## 2020-10-31 DIAGNOSIS — Z992 Dependence on renal dialysis: Secondary | ICD-10-CM | POA: Diagnosis not present

## 2020-10-31 DIAGNOSIS — N2581 Secondary hyperparathyroidism of renal origin: Secondary | ICD-10-CM | POA: Diagnosis not present

## 2020-10-31 DIAGNOSIS — E1122 Type 2 diabetes mellitus with diabetic chronic kidney disease: Secondary | ICD-10-CM | POA: Diagnosis not present

## 2020-10-31 DIAGNOSIS — N186 End stage renal disease: Secondary | ICD-10-CM | POA: Diagnosis not present

## 2020-11-02 DIAGNOSIS — N2581 Secondary hyperparathyroidism of renal origin: Secondary | ICD-10-CM | POA: Diagnosis not present

## 2020-11-02 DIAGNOSIS — N186 End stage renal disease: Secondary | ICD-10-CM | POA: Diagnosis not present

## 2020-11-02 DIAGNOSIS — E1122 Type 2 diabetes mellitus with diabetic chronic kidney disease: Secondary | ICD-10-CM | POA: Diagnosis not present

## 2020-11-02 DIAGNOSIS — Z992 Dependence on renal dialysis: Secondary | ICD-10-CM | POA: Diagnosis not present

## 2020-11-02 DIAGNOSIS — D631 Anemia in chronic kidney disease: Secondary | ICD-10-CM | POA: Diagnosis not present

## 2020-11-05 ENCOUNTER — Other Ambulatory Visit: Payer: Self-pay | Admitting: Cardiovascular Disease

## 2020-11-05 DIAGNOSIS — D631 Anemia in chronic kidney disease: Secondary | ICD-10-CM | POA: Diagnosis not present

## 2020-11-05 DIAGNOSIS — N186 End stage renal disease: Secondary | ICD-10-CM | POA: Diagnosis not present

## 2020-11-05 DIAGNOSIS — Z992 Dependence on renal dialysis: Secondary | ICD-10-CM | POA: Diagnosis not present

## 2020-11-05 DIAGNOSIS — N2581 Secondary hyperparathyroidism of renal origin: Secondary | ICD-10-CM | POA: Diagnosis not present

## 2020-11-05 DIAGNOSIS — E1122 Type 2 diabetes mellitus with diabetic chronic kidney disease: Secondary | ICD-10-CM | POA: Diagnosis not present

## 2020-11-07 DIAGNOSIS — N186 End stage renal disease: Secondary | ICD-10-CM | POA: Diagnosis not present

## 2020-11-07 DIAGNOSIS — D631 Anemia in chronic kidney disease: Secondary | ICD-10-CM | POA: Diagnosis not present

## 2020-11-07 DIAGNOSIS — N2581 Secondary hyperparathyroidism of renal origin: Secondary | ICD-10-CM | POA: Diagnosis not present

## 2020-11-07 DIAGNOSIS — Z992 Dependence on renal dialysis: Secondary | ICD-10-CM | POA: Diagnosis not present

## 2020-11-07 DIAGNOSIS — E1122 Type 2 diabetes mellitus with diabetic chronic kidney disease: Secondary | ICD-10-CM | POA: Diagnosis not present

## 2020-11-09 DIAGNOSIS — N2581 Secondary hyperparathyroidism of renal origin: Secondary | ICD-10-CM | POA: Diagnosis not present

## 2020-11-09 DIAGNOSIS — N186 End stage renal disease: Secondary | ICD-10-CM | POA: Diagnosis not present

## 2020-11-09 DIAGNOSIS — E1122 Type 2 diabetes mellitus with diabetic chronic kidney disease: Secondary | ICD-10-CM | POA: Diagnosis not present

## 2020-11-09 DIAGNOSIS — D631 Anemia in chronic kidney disease: Secondary | ICD-10-CM | POA: Diagnosis not present

## 2020-11-09 DIAGNOSIS — Z992 Dependence on renal dialysis: Secondary | ICD-10-CM | POA: Diagnosis not present

## 2020-11-12 DIAGNOSIS — N2581 Secondary hyperparathyroidism of renal origin: Secondary | ICD-10-CM | POA: Diagnosis not present

## 2020-11-12 DIAGNOSIS — D631 Anemia in chronic kidney disease: Secondary | ICD-10-CM | POA: Diagnosis not present

## 2020-11-12 DIAGNOSIS — N186 End stage renal disease: Secondary | ICD-10-CM | POA: Diagnosis not present

## 2020-11-12 DIAGNOSIS — Z992 Dependence on renal dialysis: Secondary | ICD-10-CM | POA: Diagnosis not present

## 2020-11-12 DIAGNOSIS — E1122 Type 2 diabetes mellitus with diabetic chronic kidney disease: Secondary | ICD-10-CM | POA: Diagnosis not present

## 2020-11-14 DIAGNOSIS — D631 Anemia in chronic kidney disease: Secondary | ICD-10-CM | POA: Diagnosis not present

## 2020-11-14 DIAGNOSIS — N2581 Secondary hyperparathyroidism of renal origin: Secondary | ICD-10-CM | POA: Diagnosis not present

## 2020-11-14 DIAGNOSIS — E1122 Type 2 diabetes mellitus with diabetic chronic kidney disease: Secondary | ICD-10-CM | POA: Diagnosis not present

## 2020-11-14 DIAGNOSIS — Z992 Dependence on renal dialysis: Secondary | ICD-10-CM | POA: Diagnosis not present

## 2020-11-14 DIAGNOSIS — N186 End stage renal disease: Secondary | ICD-10-CM | POA: Diagnosis not present

## 2020-11-16 DIAGNOSIS — N2581 Secondary hyperparathyroidism of renal origin: Secondary | ICD-10-CM | POA: Diagnosis not present

## 2020-11-16 DIAGNOSIS — Z992 Dependence on renal dialysis: Secondary | ICD-10-CM | POA: Diagnosis not present

## 2020-11-16 DIAGNOSIS — I129 Hypertensive chronic kidney disease with stage 1 through stage 4 chronic kidney disease, or unspecified chronic kidney disease: Secondary | ICD-10-CM | POA: Diagnosis not present

## 2020-11-16 DIAGNOSIS — E1122 Type 2 diabetes mellitus with diabetic chronic kidney disease: Secondary | ICD-10-CM | POA: Diagnosis not present

## 2020-11-16 DIAGNOSIS — N186 End stage renal disease: Secondary | ICD-10-CM | POA: Diagnosis not present

## 2020-11-16 DIAGNOSIS — D631 Anemia in chronic kidney disease: Secondary | ICD-10-CM | POA: Diagnosis not present

## 2020-11-19 DIAGNOSIS — N186 End stage renal disease: Secondary | ICD-10-CM | POA: Diagnosis not present

## 2020-11-19 DIAGNOSIS — N2581 Secondary hyperparathyroidism of renal origin: Secondary | ICD-10-CM | POA: Diagnosis not present

## 2020-11-19 DIAGNOSIS — E1122 Type 2 diabetes mellitus with diabetic chronic kidney disease: Secondary | ICD-10-CM | POA: Diagnosis not present

## 2020-11-19 DIAGNOSIS — Z992 Dependence on renal dialysis: Secondary | ICD-10-CM | POA: Diagnosis not present

## 2020-11-19 DIAGNOSIS — D631 Anemia in chronic kidney disease: Secondary | ICD-10-CM | POA: Diagnosis not present

## 2020-11-21 DIAGNOSIS — D631 Anemia in chronic kidney disease: Secondary | ICD-10-CM | POA: Diagnosis not present

## 2020-11-21 DIAGNOSIS — E1122 Type 2 diabetes mellitus with diabetic chronic kidney disease: Secondary | ICD-10-CM | POA: Diagnosis not present

## 2020-11-21 DIAGNOSIS — N186 End stage renal disease: Secondary | ICD-10-CM | POA: Diagnosis not present

## 2020-11-21 DIAGNOSIS — N2581 Secondary hyperparathyroidism of renal origin: Secondary | ICD-10-CM | POA: Diagnosis not present

## 2020-11-21 DIAGNOSIS — Z992 Dependence on renal dialysis: Secondary | ICD-10-CM | POA: Diagnosis not present

## 2020-11-23 DIAGNOSIS — Z992 Dependence on renal dialysis: Secondary | ICD-10-CM | POA: Diagnosis not present

## 2020-11-23 DIAGNOSIS — N2581 Secondary hyperparathyroidism of renal origin: Secondary | ICD-10-CM | POA: Diagnosis not present

## 2020-11-23 DIAGNOSIS — N186 End stage renal disease: Secondary | ICD-10-CM | POA: Diagnosis not present

## 2020-11-23 DIAGNOSIS — D631 Anemia in chronic kidney disease: Secondary | ICD-10-CM | POA: Diagnosis not present

## 2020-11-23 DIAGNOSIS — E1122 Type 2 diabetes mellitus with diabetic chronic kidney disease: Secondary | ICD-10-CM | POA: Diagnosis not present

## 2020-11-26 DIAGNOSIS — Z992 Dependence on renal dialysis: Secondary | ICD-10-CM | POA: Diagnosis not present

## 2020-11-26 DIAGNOSIS — E1122 Type 2 diabetes mellitus with diabetic chronic kidney disease: Secondary | ICD-10-CM | POA: Diagnosis not present

## 2020-11-26 DIAGNOSIS — N2581 Secondary hyperparathyroidism of renal origin: Secondary | ICD-10-CM | POA: Diagnosis not present

## 2020-11-26 DIAGNOSIS — D631 Anemia in chronic kidney disease: Secondary | ICD-10-CM | POA: Diagnosis not present

## 2020-11-26 DIAGNOSIS — N186 End stage renal disease: Secondary | ICD-10-CM | POA: Diagnosis not present

## 2020-11-27 ENCOUNTER — Other Ambulatory Visit: Payer: Self-pay

## 2020-11-27 ENCOUNTER — Ambulatory Visit (INDEPENDENT_AMBULATORY_CARE_PROVIDER_SITE_OTHER): Payer: Medicare Other | Admitting: Pulmonary Disease

## 2020-11-27 ENCOUNTER — Encounter: Payer: Self-pay | Admitting: Pulmonary Disease

## 2020-11-27 VITALS — BP 118/56 | HR 91 | Temp 98.3°F | Ht 71.0 in | Wt 218.6 lb

## 2020-11-27 DIAGNOSIS — J90 Pleural effusion, not elsewhere classified: Secondary | ICD-10-CM | POA: Diagnosis not present

## 2020-11-27 DIAGNOSIS — Z951 Presence of aortocoronary bypass graft: Secondary | ICD-10-CM

## 2020-11-27 DIAGNOSIS — J986 Disorders of diaphragm: Secondary | ICD-10-CM

## 2020-11-27 DIAGNOSIS — Z992 Dependence on renal dialysis: Secondary | ICD-10-CM

## 2020-11-27 DIAGNOSIS — R0981 Nasal congestion: Secondary | ICD-10-CM

## 2020-11-27 DIAGNOSIS — N186 End stage renal disease: Secondary | ICD-10-CM | POA: Diagnosis not present

## 2020-11-27 MED ORDER — IPRATROPIUM BROMIDE 0.03 % NA SOLN: 2.0000 | Freq: Two times a day (BID) | NASAL | 1 refills | Status: DC

## 2020-11-27 NOTE — Progress Notes (Signed)
Ultrasound left chest procedure Note  Bradley Soto  [id number]  [birth year]  Date:11/27/20  Time:10:12 AM   Provider Performing:[name]   Procedure: Ultrasound chest imaging  Indication(s) Pleural Effusion  Consent Risks of the procedure as well as the alternatives and risks of each were explained to the patient and/or caregiver.  Consent for the procedure was obtained and is signed in the bedside chart  Anesthesia Not applicable  Time Out Verified patient identification, verified procedure, site/side was marked, verified correct patient position, special equipment/implants available, medications/allergies/relevant history reviewed, required imaging and test results available.   Sterile Technique Not applicable  Procedure Description Ultrasound of the chest in the office today.  We compare diaphragmatic excursion between the left and right side.  There is diminished left-sided diaphragmatic excursion compared to the right.  Additionally there was a small 1 cm portion of fluid in the left chest.  Pleural effusion not large enough to consider sampling.  He could also visibly see a area of atelectasis of the lung consistent with chest x-ray findings.   Complications/Tolerance None; patient tolerated the procedure well.    Bradley Nash, DO [facility] 11/27/2020 10:15 AM

## 2020-11-27 NOTE — Patient Instructions (Addendum)
Thank you for visiting Dr. Valeta Harms at [facility]. Today we recommend the following:  Meds ordered this encounter  Medications   ipratropium (ATROVENT) 0.03 % nasal spray    Sig: Place 2 sprays into both nostrils every 12 (twelve) hours.    Dispense:  30 mL    Refill:  1   See me as needed. If you need something call me.    Please do your part to reduce the spread of COVID-19.

## 2020-11-27 NOTE — Progress Notes (Signed)
Synopsis: Referred in Oct 2022 for pleural effusion by [name], MD  Subjective:   PATIENT ID: Bradley Soto GENDER: male DOB: [birth year], MRN: [id number]  Chief Complaint  Patient presents with   Consult    PMH CAD CABG in 2020, ESRD on iHD started 2021 September, had recurrent left sided small effusion.  He has had fluid drained from his left chest after his surgery.  He had a recent chest x-ray completed at primary care's office which revealed atelectasis and possible small effusion on the left.  He was referred for evaluation of recurrent left-sided effusion today.  From respiratory standpoint he does feel short of breath though this is primarily predominantly on days before he gets dialysis.  He says after dialysis sometimes he has shortness of breath and leg cramps.  He used to smoke many years ago, approximately 60 years ago.   Past Medical History:  Diagnosis Date   Anxiety    Bell's palsy    left side of face droops   CAD (coronary artery disease)    s/p stent placement   CKD (chronic kidney disease), stage III (HCC)    [place]- [facility]- [facility]   Colon polyps    Diabetes mellitus without complication (HCC)    Gout    History of blood transfusion    History of kidney stones    Passes   HLD (hyperlipidemia)    HTN (hypertension)    Insomnia    MI (myocardial infarction) ([place]) 1998   NSTEMI (non-ST elevated myocardial infarction) ([place])    s/p PCI in early 2000s   OSA on CPAP    wears CPAP   Presence of permanent cardiac pacemaker    SSS (sick sinus syndrome) (HCC)    Type II or unspecified type diabetes mellitus without mention of complication, not stated as uncontrolled    Wears glasses    reading    Wears partial dentures      Family History  Problem Relation Age of Onset   Heart attack Father 15   Heart disease Father    Breast cancer Sister    Pulmonary embolism Sister    Deep vein thrombosis Sister      Past Surgical History:  Procedure  Laterality Date   APPENDECTOMY     AV FISTULA PLACEMENT Right 07/28/2019   Procedure: RIGHT ARM Brachiocephalic ARTERIOVENOUS (AV) FISTULA CREATION;  Surgeon: Serafina Mitchell, MD;  Location: [facility] OR;  Service: Vascular;  Laterality: Right;   CATARACT EXTRACTION     CLIPPING OF ATRIAL APPENDAGE  12/05/2018   Procedure: Clipping Of Atrial Appendage;  Surgeon: Wonda Olds, MD;  Location: [facility] OR;  Service: Open Heart Surgery;;   CORONARY ANGIOPLASTY WITH STENT PLACEMENT     x4   CORONARY ARTERY BYPASS GRAFT N/A 12/05/2018   Procedure: CORONARY ARTERY BYPASS GRAFTING (CABG) X FOUR USING LEFT INTERNAL MAMMARY ARTERY AND RIGHT SAPHENOUS VEIN GRAFTS;  Surgeon: Wonda Olds, MD;  Location: [facility] OR;  Service: Open Heart Surgery;  Laterality: N/A;   HERNIA REPAIR     ventral hernia repair with mesh--revision also was required   INSERT / REPLACE / REMOVE PACEMAKER  05/10/2000   pacemaker implanted in [place]]   IR THORACENTESIS ASP PLEURAL SPACE W/IMG GUIDE  12/21/2018   IR THORACENTESIS ASP PLEURAL SPACE W/IMG GUIDE  12/30/2018   LEFT HEART CATH AND CORONARY ANGIOGRAPHY N/A 11/30/2018   Procedure: LEFT HEART CATH AND CORONARY ANGIOGRAPHY;  Surgeon: Burnell Blanks, MD;  [facility]  INVASIVE CV LAB;  Service: Cardiovascular;  Laterality: N/A;   MAZE N/A 12/05/2018   Procedure: MAZE;  Surgeon: Wonda Olds, MD;  Location: [place];  Service: Open Heart Surgery;  Laterality: N/A;   PACEMAKER GENERATOR CHANGE  09/03/09   MDT Adapta generator change in [place]  2/2   polyp that could not be removed on colonoscopy   PPM GENERATOR CHANGEOUT N/A 06/01/2019   Procedure: PPM GENERATOR CHANGEOUT;  Surgeon: [name], MD;  Location: [place] CV LAB;  Service: Cardiovascular;  Laterality: N/A;   REVISON OF ARTERIOVENOUS FISTULA Right 01/24/2020   Procedure: BANDING OF RIGHT ARM ARTERIOVENOUS FISTULA;  Surgeon: Serafina Mitchell, MD;  Location: [facility];  Service: Vascular;   Laterality: Right;   TEE WITHOUT CARDIOVERSION N/A 12/05/2018   Procedure: TRANSESOPHAGEAL ECHOCARDIOGRAM (TEE);  Surgeon: Wonda Olds, MD;  Location: [place];  Service: Open Heart Surgery;  Laterality: N/A;    Social History   Socioeconomic History   Marital status: Married    Spouse name: Not on file   Number of children: Not on file   Years of education: Not on file   Highest education level: Not on file  Occupational History   Not on file  Tobacco Use   Smoking status: Former    Types: Cigarettes   Smokeless tobacco: Never   Tobacco comments:    " many years ago"  Vaping Use   Vaping Use: Never used  Substance and Sexual Activity   Alcohol use: No   Drug use: No   Sexual activity: Not on file  Other Topics Concern   Not on file  Social History Narrative   Not on file   Social Determinants of Health   Financial Resource Strain: Not on file  Food Insecurity: Not on file  Transportation Needs: Not on file  Physical Activity: Not on file  Stress: Not on file  Social Connections: Not on file  Intimate Partner Violence: Not on file     Allergies  Allergen Reactions   Other Other (See Comments)   Shellfish Allergy Rash     Outpatient Medications Prior to Visit  Medication Sig Dispense Refill   allopurinol (ZYLOPRIM) 100 MG tablet Take 1 tablet (100 mg total) by mouth daily. 30 tablet 3   aspirin EC 81 MG tablet Take 81 mg by mouth daily.     B Complex-C-Zn-Folic Acid (DIALYVITE 272 WITH ZINC) 0.8 MG TABS Take 1 tablet by mouth at bedtime.     Cholecalciferol (VITAMIN D-3) 25 MCG (1000 UT) CAPS Take 3,000 Units by mouth daily.      diphenhydramine-acetaminophen (TYLENOL PM) 25-500 MG TABS tablet Take 0.5 tablets by mouth at bedtime as needed (sleep).      hydrALAZINE (APRESOLINE) 25 MG tablet Take 1 tablet (25 mg total) by mouth 3 (three) times daily. 90 tablet 3   Methoxy PEG-Epoetin Beta (MIRCERA IJ) Mircera     metoprolol tartrate (LOPRESSOR) 25 MG tablet  TAKE 1 AND 1/2 TABLETS BY MOUTH TWICE A DAY NEEDS APPT FOR REFILLS 90 tablet 0   NOVOLOG MIX 70/30 FLEXPEN (70-30) 100 UNIT/ML FlexPen Inject 35 Units into the skin daily.   3   ondansetron (ZOFRAN) 4 MG tablet Take 1 tablet (4 mg total) by mouth every 6 (six) hours as needed for nausea. 30 tablet 1   rosuvastatin (CRESTOR) 20 MG tablet Take 1 tablet (20 mg total) by mouth daily at 6 PM. Please make overdue  appt with Dr. [name] before anymore refills. Thank you 2nd attempt 15 tablet 0   sevelamer carbonate (RENVELA) 800 MG tablet Take 1 tablet (800 mg total) by mouth 3 (three) times daily with meals. (Patient taking differently: Take 800-1,600 mg by mouth See admin instructions. Take 2 tablets (1600 mg) by mouth with each meal & take 1 tablet (800 mg) by mouth with a snack.) 90 tablet 3   HYDROcodone-acetaminophen (NORCO) 5-325 MG tablet Take 1 tablet by mouth every 6 (six) hours as needed for moderate pain. (Patient not taking: No sig reported) 10 tablet 0   No facility-administered medications prior to visit.    Review of Systems  Constitutional:  Negative for chills, fever, malaise/fatigue and weight loss.  HENT:  Negative for hearing loss, sore throat and tinnitus.   Eyes:  Negative for blurred vision and double vision.  Respiratory:  Positive for shortness of breath. Negative for cough, hemoptysis, sputum production, wheezing and stridor.   Cardiovascular:  Negative for chest pain, palpitations, orthopnea, leg swelling and PND.  Gastrointestinal:  Negative for abdominal pain, constipation, diarrhea, heartburn, nausea and vomiting.  Genitourinary:  Negative for dysuria, hematuria and urgency.  Musculoskeletal:  Negative for joint pain and myalgias.  Skin:  Negative for itching and rash.  Neurological:  Negative for dizziness, tingling, weakness and headaches.  Endo/Heme/Allergies:  Negative for environmental allergies. Does not bruise/bleed easily.  Psychiatric/Behavioral:  Negative for  depression. The patient is not nervous/anxious and does not have insomnia.   All other systems reviewed and are negative.   Objective:  Physical Exam Vitals reviewed.  Constitutional:      General: He is not in acute distress.    Appearance: He is well-developed.  HENT:     Head: Normocephalic and atraumatic.  Eyes:     General: No scleral icterus.    Conjunctiva/sclera: Conjunctivae normal.     Pupils: Pupils are equal, round, and reactive to light.  Neck:     Vascular: No JVD.     Trachea: No tracheal deviation.  Cardiovascular:     Rate and Rhythm: Normal rate and regular rhythm.     Heart sounds: Normal heart sounds. No murmur heard. Pulmonary:     Effort: Pulmonary effort is normal. No tachypnea, accessory muscle usage or respiratory distress.     Breath sounds: No stridor. No wheezing, rhonchi or rales.     Comments: Diminished breath sounds in the left base compared to the right Abdominal:     General: Bowel sounds are normal. There is no distension.     Palpations: Abdomen is soft.     Tenderness: There is no abdominal tenderness.  Musculoskeletal:        General: No tenderness.     Cervical back: Neck supple.  Lymphadenopathy:     Cervical: No cervical adenopathy.  Skin:    General: Skin is warm and dry.     Capillary Refill: Capillary refill takes less than 2 seconds.     Findings: No rash.  Neurological:     Mental Status: He is alert and oriented to person, place, and time.  Psychiatric:        Behavior: Behavior normal.     Vitals:   11/27/20 0931  BP: (!) 118/56  Pulse: 91  Temp: 98.3 F (36.8 C)  TempSrc: Oral  SpO2: 96%  Weight: 218 lb 9.6 oz (99.2 kg)  Height: 5\' 11"  (1.803 m)   96% on RA BMI Readings from Last 3 Encounters:  11/27/20 30.49 kg/m  06/24/20 29.99 kg/m  04/29/20 29.16 kg/m   Wt Readings from Last 3 Encounters:  11/27/20 218 lb 9.6 oz (99.2 kg)  06/24/20 215 lb (97.5 kg)  04/29/20 215 lb (97.5 kg)     CBC     Component Value Date/Time   WBC 7.4 11/03/2019 0533   RBC 3.03 (L) 11/03/2019 0533   HGB 10.2 (L) 01/24/2020 0706   HGB 12.5 (L) 05/30/2019 0923   HCT 30.0 (L) 01/24/2020 0706   HCT 38.8 05/30/2019 0923   PLT 166 11/03/2019 0533   PLT 136 (L) 05/30/2019 0923   MCV 93.4 11/03/2019 0533   MCV 95 05/30/2019 0923   MCH 30.0 11/03/2019 0533   MCHC 32.2 11/03/2019 0533   RDW 15.0 11/03/2019 0533   RDW 16.3 (H) 05/30/2019 0923   LYMPHSABS 1.7 05/30/2019 0923   MONOABS 0.9 12/11/2018 0254   EOSABS 0.5 (H) 05/30/2019 0923   BASOSABS 0.0 05/30/2019 0923     Chest Imaging:  Series of chest x-rays from 20 20-20 21 reviewed today in the office: Constantly has a little bit of basilar atelectasis on the left side with associated small effusion. The patient's images have been independently reviewed by me.    Pulmonary Functions Testing Results: PFT Results Latest Ref Rng & Units 12/01/2018  FVC-Pre L 3.46  FVC-Predicted Pre % 79  FVC-Post L 4.07  FVC-Predicted Post % 94  Pre FEV1/FVC % % 73  Post FEV1/FCV % % 77  FEV1-Pre L 2.53  FEV1-Predicted Pre % 81  FEV1-Post L 3.13  DLCO uncorrected ml/min/mmHg 20.65  DLCO UNC% % 80  DLCO corrected ml/min/mmHg 22.32  DLCO COR %Predicted % 86  DLVA Predicted % 100  TLC L 7.23  TLC % Predicted % 98  RV % Predicted % 130    FeNO:   Pathology:   Echocardiogram:   Heart Catheterization:     Assessment & Plan:     ICD-10-CM   1. Recurrent left pleural effusion  J90     2. Nasal congestion  R09.81     3. ESRD (end stage renal disease) on dialysis ([place])  N18.6    Z99.2     4. S/P CABG (coronary artery bypass graft)  Z95.1     5. Diaphragm dysfunction  J98.6    Bedside US, with decreased movement of left diaphram with inspiration compared to right       Discussion:  This is an 80 year old gentleman, history of CABG, recurrent left-sided lower lobe atelectasis and small pleural effusion, end-stage renal disease on  dialysis.  I looked at his chest today with bedside ultrasound.  He has decreased diaphragm movement on the left side compared to the right.  I suspect this is related to his history of CABG and chest surgery.  May have some diaphragm dysfunction.  He also has end-stage renal disease managed with dialysis for volume removal.  I think he has plenty of reasons just for that alone to have recurrent left-sided effusion.  He does not have enough fluid today to consider thoracentesis in the office.  He approximately has 1 cm of fluid in the left.  Right side appears normal.  Plan: No indication for thoracentesis today in the office not enough fluid to consider tapping. I think he has left-sided diaphragm dysfunction after having his CABG.  Its not fully paralyzed but his left side does not move as well as his right under ultrasound imaging today in the office. He has  end-stage renal disease and needs to continue fluid management with IHD. As for his nasal congestion I gave him a prescription today of ipratropium nasal spray to see if this helps.  He feels like his nose is constantly running.  Patient to follow-up with [place] as needed.  We appreciate the consultation.  If the patient's effusion gets larger over time we can always consider tapping it.  We also discussed utility of a sniff test to see if we could prove whether or not he has diaphragmatic weakness on the left side.  I am not sure that it would change management at all.  We reviewed ultrasound imaging today in the office with patient and patient's wife.  They were satisfied about being able to see the difference in the office under ultrasound.   Current Outpatient Medications:    allopurinol (ZYLOPRIM) 100 MG tablet, Take 1 tablet (100 mg total) by mouth daily., Disp: 30 tablet, Rfl: 3   aspirin EC 81 MG tablet, Take 81 mg by mouth daily., Disp: , Rfl:    B Complex-C-Zn-Folic Acid (DIALYVITE 295 WITH ZINC) 0.8 MG TABS, Take 1 tablet by mouth at  bedtime., Disp: , Rfl:    Cholecalciferol (VITAMIN D-3) 25 MCG (1000 UT) CAPS, Take 3,000 Units by mouth daily. , Disp: , Rfl:    diphenhydramine-acetaminophen (TYLENOL PM) 25-500 MG TABS tablet, Take 0.5 tablets by mouth at bedtime as needed (sleep). , Disp: , Rfl:    hydrALAZINE (APRESOLINE) 25 MG tablet, Take 1 tablet (25 mg total) by mouth 3 (three) times daily., Disp: 90 tablet, Rfl: 3   ipratropium (ATROVENT) 0.03 % nasal spray, Place 2 sprays into both nostrils every 12 (twelve) hours., Disp: 30 mL, Rfl: 1   Methoxy PEG-Epoetin Beta (MIRCERA IJ), Mircera, Disp: , Rfl:    metoprolol tartrate (LOPRESSOR) 25 MG tablet, TAKE 1 AND 1/2 TABLETS BY MOUTH TWICE A DAY NEEDS APPT FOR REFILLS, Disp: 90 tablet, Rfl: 0   NOVOLOG MIX 70/30 FLEXPEN (70-30) 100 UNIT/ML FlexPen, Inject 35 Units into the skin daily. , Disp: , Rfl: 3   ondansetron (ZOFRAN) 4 MG tablet, Take 1 tablet (4 mg total) by mouth every 6 (six) hours as needed for nausea., Disp: 30 tablet, Rfl: 1   rosuvastatin (CRESTOR) 20 MG tablet, Take 1 tablet (20 mg total) by mouth daily at 6 PM. Please make overdue appt with Dr. [name] before anymore refills. Thank you 2nd attempt, Disp: 15 tablet, Rfl: 0   sevelamer carbonate (RENVELA) 800 MG tablet, Take 1 tablet (800 mg total) by mouth 3 (three) times daily with meals. (Patient taking differently: Take 800-1,600 mg by mouth See admin instructions. Take 2 tablets (1600 mg) by mouth with each meal & take 1 tablet (800 mg) by mouth with a snack.), Disp: 90 tablet, Rfl: 3   HYDROcodone-acetaminophen (NORCO) 5-325 MG tablet, Take 1 tablet by mouth every 6 (six) hours as needed for moderate pain. (Patient not taking: No sig reported), Disp: 10 tablet, Rfl: 0    [name], DO [facility] 11/27/2020 9:57 AM

## 2020-11-28 DIAGNOSIS — E1122 Type 2 diabetes mellitus with diabetic chronic kidney disease: Secondary | ICD-10-CM | POA: Diagnosis not present

## 2020-11-28 DIAGNOSIS — D631 Anemia in chronic kidney disease: Secondary | ICD-10-CM | POA: Diagnosis not present

## 2020-11-28 DIAGNOSIS — Z992 Dependence on renal dialysis: Secondary | ICD-10-CM | POA: Diagnosis not present

## 2020-11-28 DIAGNOSIS — N2581 Secondary hyperparathyroidism of renal origin: Secondary | ICD-10-CM | POA: Diagnosis not present

## 2020-11-28 DIAGNOSIS — N186 End stage renal disease: Secondary | ICD-10-CM | POA: Diagnosis not present

## 2020-11-29 ENCOUNTER — Ambulatory Visit (INDEPENDENT_AMBULATORY_CARE_PROVIDER_SITE_OTHER): Payer: Medicare Other

## 2020-11-29 DIAGNOSIS — I495 Sick sinus syndrome: Secondary | ICD-10-CM

## 2020-11-30 DIAGNOSIS — N186 End stage renal disease: Secondary | ICD-10-CM | POA: Diagnosis not present

## 2020-11-30 DIAGNOSIS — D631 Anemia in chronic kidney disease: Secondary | ICD-10-CM | POA: Diagnosis not present

## 2020-11-30 DIAGNOSIS — E1122 Type 2 diabetes mellitus with diabetic chronic kidney disease: Secondary | ICD-10-CM | POA: Diagnosis not present

## 2020-11-30 DIAGNOSIS — N2581 Secondary hyperparathyroidism of renal origin: Secondary | ICD-10-CM | POA: Diagnosis not present

## 2020-11-30 DIAGNOSIS — Z992 Dependence on renal dialysis: Secondary | ICD-10-CM | POA: Diagnosis not present

## 2020-12-03 ENCOUNTER — Telehealth: Payer: Self-pay | Admitting: Cardiovascular Disease

## 2020-12-03 ENCOUNTER — Ambulatory Visit: Payer: Medicare Other | Admitting: Internal Medicine

## 2020-12-03 DIAGNOSIS — N186 End stage renal disease: Secondary | ICD-10-CM | POA: Diagnosis not present

## 2020-12-03 DIAGNOSIS — D631 Anemia in chronic kidney disease: Secondary | ICD-10-CM | POA: Diagnosis not present

## 2020-12-03 DIAGNOSIS — E1122 Type 2 diabetes mellitus with diabetic chronic kidney disease: Secondary | ICD-10-CM | POA: Diagnosis not present

## 2020-12-03 DIAGNOSIS — Z992 Dependence on renal dialysis: Secondary | ICD-10-CM | POA: Diagnosis not present

## 2020-12-03 DIAGNOSIS — N2581 Secondary hyperparathyroidism of renal origin: Secondary | ICD-10-CM | POA: Diagnosis not present

## 2020-12-03 LAB — CUP PACEART REMOTE DEVICE CHECK
Battery Remaining Longevity: 127 mo
Battery Voltage: 3.03 V
Brady Statistic AP VP Percent: 0.01 %
Brady Statistic AP VS Percent: 11.73 %
Brady Statistic AS VP Percent: 0.05 %
Brady Statistic AS VS Percent: 88.21 %
Brady Statistic RA Percent Paced: 11.79 %
Brady Statistic RV Percent Paced: 0.05 %
Date Time Interrogation Session: 20221014004042
Implantable Lead Implant Date: 20020325
Implantable Lead Implant Date: 20020325
Implantable Lead Location: 753859
Implantable Lead Location: 753860
Implantable Lead Model: 4092
Implantable Lead Model: 4524
Implantable Pulse Generator Implant Date: 20210415
Lead Channel Impedance Value: 266 Ohm
Lead Channel Impedance Value: 285 Ohm
Lead Channel Impedance Value: 418 Ohm
Lead Channel Impedance Value: 532 Ohm
Lead Channel Pacing Threshold Amplitude: 0.5 V
Lead Channel Pacing Threshold Amplitude: 2.5 V
Lead Channel Pacing Threshold Pulse Width: 0.4 ms
Lead Channel Pacing Threshold Pulse Width: 0.4 ms
Lead Channel Sensing Intrinsic Amplitude: 0.5 mV
Lead Channel Sensing Intrinsic Amplitude: 0.5 mV
Lead Channel Sensing Intrinsic Amplitude: 20.625 mV
Lead Channel Sensing Intrinsic Amplitude: 20.625 mV
Lead Channel Setting Pacing Amplitude: 2.5 V
Lead Channel Setting Pacing Amplitude: 3 V
Lead Channel Setting Pacing Pulse Width: 0.4 ms
Lead Channel Setting Sensing Sensitivity: 1.2 mV

## 2020-12-03 NOTE — Telephone Encounter (Signed)
Called patient's wife back about message. Patient complaining of having chest pain that comes and goes with activity for about 2 weeks. Patient has history of CABG and has pacemaker. Made patient an appointment to see DOD, Dr. Lovena Le, today.

## 2020-12-03 NOTE — Telephone Encounter (Signed)
Pt c/o of Chest Pain: STAT if CP now or developed within 24 hours  1. Are you having CP right now?  Patient's wife states she is not currently with the patient. He is at dialysis.  2. Are you experiencing any other symptoms (ex. SOB, nausea, vomiting, sweating)?  Patient's wife is unsure   3. How long have you been experiencing CP?  Patient's wife states the patient has been having chest pain for a couple of weeks   4. Is your CP continuous or coming and going?  Coming and going, mainly with exertion   5. Have you taken Nitroglycerin?  No  ?

## 2020-12-05 DIAGNOSIS — Z992 Dependence on renal dialysis: Secondary | ICD-10-CM | POA: Diagnosis not present

## 2020-12-05 DIAGNOSIS — N186 End stage renal disease: Secondary | ICD-10-CM | POA: Diagnosis not present

## 2020-12-05 DIAGNOSIS — N2581 Secondary hyperparathyroidism of renal origin: Secondary | ICD-10-CM | POA: Diagnosis not present

## 2020-12-05 DIAGNOSIS — E1122 Type 2 diabetes mellitus with diabetic chronic kidney disease: Secondary | ICD-10-CM | POA: Diagnosis not present

## 2020-12-05 DIAGNOSIS — E039 Hypothyroidism, unspecified: Secondary | ICD-10-CM | POA: Diagnosis not present

## 2020-12-05 DIAGNOSIS — D631 Anemia in chronic kidney disease: Secondary | ICD-10-CM | POA: Diagnosis not present

## 2020-12-07 DIAGNOSIS — N2581 Secondary hyperparathyroidism of renal origin: Secondary | ICD-10-CM | POA: Diagnosis not present

## 2020-12-07 DIAGNOSIS — N186 End stage renal disease: Secondary | ICD-10-CM | POA: Diagnosis not present

## 2020-12-07 DIAGNOSIS — E1122 Type 2 diabetes mellitus with diabetic chronic kidney disease: Secondary | ICD-10-CM | POA: Diagnosis not present

## 2020-12-07 DIAGNOSIS — D631 Anemia in chronic kidney disease: Secondary | ICD-10-CM | POA: Diagnosis not present

## 2020-12-07 DIAGNOSIS — Z992 Dependence on renal dialysis: Secondary | ICD-10-CM | POA: Diagnosis not present

## 2020-12-09 NOTE — Progress Notes (Signed)
Remote pacemaker transmission.   

## 2020-12-10 DIAGNOSIS — Z992 Dependence on renal dialysis: Secondary | ICD-10-CM | POA: Diagnosis not present

## 2020-12-10 DIAGNOSIS — D631 Anemia in chronic kidney disease: Secondary | ICD-10-CM | POA: Diagnosis not present

## 2020-12-10 DIAGNOSIS — N2581 Secondary hyperparathyroidism of renal origin: Secondary | ICD-10-CM | POA: Diagnosis not present

## 2020-12-10 DIAGNOSIS — N186 End stage renal disease: Secondary | ICD-10-CM | POA: Diagnosis not present

## 2020-12-10 DIAGNOSIS — E1122 Type 2 diabetes mellitus with diabetic chronic kidney disease: Secondary | ICD-10-CM | POA: Diagnosis not present

## 2020-12-12 DIAGNOSIS — D631 Anemia in chronic kidney disease: Secondary | ICD-10-CM | POA: Diagnosis not present

## 2020-12-12 DIAGNOSIS — Z992 Dependence on renal dialysis: Secondary | ICD-10-CM | POA: Diagnosis not present

## 2020-12-12 DIAGNOSIS — E1122 Type 2 diabetes mellitus with diabetic chronic kidney disease: Secondary | ICD-10-CM | POA: Diagnosis not present

## 2020-12-12 DIAGNOSIS — N186 End stage renal disease: Secondary | ICD-10-CM | POA: Diagnosis not present

## 2020-12-12 DIAGNOSIS — N2581 Secondary hyperparathyroidism of renal origin: Secondary | ICD-10-CM | POA: Diagnosis not present

## 2020-12-14 DIAGNOSIS — E1122 Type 2 diabetes mellitus with diabetic chronic kidney disease: Secondary | ICD-10-CM | POA: Diagnosis not present

## 2020-12-14 DIAGNOSIS — N2581 Secondary hyperparathyroidism of renal origin: Secondary | ICD-10-CM | POA: Diagnosis not present

## 2020-12-14 DIAGNOSIS — N186 End stage renal disease: Secondary | ICD-10-CM | POA: Diagnosis not present

## 2020-12-14 DIAGNOSIS — Z992 Dependence on renal dialysis: Secondary | ICD-10-CM | POA: Diagnosis not present

## 2020-12-14 DIAGNOSIS — D631 Anemia in chronic kidney disease: Secondary | ICD-10-CM | POA: Diagnosis not present

## 2020-12-17 DIAGNOSIS — D631 Anemia in chronic kidney disease: Secondary | ICD-10-CM | POA: Diagnosis not present

## 2020-12-17 DIAGNOSIS — Z992 Dependence on renal dialysis: Secondary | ICD-10-CM | POA: Diagnosis not present

## 2020-12-17 DIAGNOSIS — E1122 Type 2 diabetes mellitus with diabetic chronic kidney disease: Secondary | ICD-10-CM | POA: Diagnosis not present

## 2020-12-17 DIAGNOSIS — N186 End stage renal disease: Secondary | ICD-10-CM | POA: Diagnosis not present

## 2020-12-17 DIAGNOSIS — D509 Iron deficiency anemia, unspecified: Secondary | ICD-10-CM | POA: Diagnosis not present

## 2020-12-17 DIAGNOSIS — N2581 Secondary hyperparathyroidism of renal origin: Secondary | ICD-10-CM | POA: Diagnosis not present

## 2020-12-17 DIAGNOSIS — I129 Hypertensive chronic kidney disease with stage 1 through stage 4 chronic kidney disease, or unspecified chronic kidney disease: Secondary | ICD-10-CM | POA: Diagnosis not present

## 2020-12-19 DIAGNOSIS — D509 Iron deficiency anemia, unspecified: Secondary | ICD-10-CM | POA: Diagnosis not present

## 2020-12-19 DIAGNOSIS — Z992 Dependence on renal dialysis: Secondary | ICD-10-CM | POA: Diagnosis not present

## 2020-12-19 DIAGNOSIS — E1122 Type 2 diabetes mellitus with diabetic chronic kidney disease: Secondary | ICD-10-CM | POA: Diagnosis not present

## 2020-12-19 DIAGNOSIS — D631 Anemia in chronic kidney disease: Secondary | ICD-10-CM | POA: Diagnosis not present

## 2020-12-19 DIAGNOSIS — N186 End stage renal disease: Secondary | ICD-10-CM | POA: Diagnosis not present

## 2020-12-19 DIAGNOSIS — N2581 Secondary hyperparathyroidism of renal origin: Secondary | ICD-10-CM | POA: Diagnosis not present

## 2020-12-21 DIAGNOSIS — E1122 Type 2 diabetes mellitus with diabetic chronic kidney disease: Secondary | ICD-10-CM | POA: Diagnosis not present

## 2020-12-21 DIAGNOSIS — N186 End stage renal disease: Secondary | ICD-10-CM | POA: Diagnosis not present

## 2020-12-21 DIAGNOSIS — N2581 Secondary hyperparathyroidism of renal origin: Secondary | ICD-10-CM | POA: Diagnosis not present

## 2020-12-21 DIAGNOSIS — Z992 Dependence on renal dialysis: Secondary | ICD-10-CM | POA: Diagnosis not present

## 2020-12-21 DIAGNOSIS — D631 Anemia in chronic kidney disease: Secondary | ICD-10-CM | POA: Diagnosis not present

## 2020-12-21 DIAGNOSIS — D509 Iron deficiency anemia, unspecified: Secondary | ICD-10-CM | POA: Diagnosis not present

## 2020-12-24 DIAGNOSIS — N186 End stage renal disease: Secondary | ICD-10-CM | POA: Diagnosis not present

## 2020-12-24 DIAGNOSIS — D631 Anemia in chronic kidney disease: Secondary | ICD-10-CM | POA: Diagnosis not present

## 2020-12-24 DIAGNOSIS — E1122 Type 2 diabetes mellitus with diabetic chronic kidney disease: Secondary | ICD-10-CM | POA: Diagnosis not present

## 2020-12-24 DIAGNOSIS — D509 Iron deficiency anemia, unspecified: Secondary | ICD-10-CM | POA: Diagnosis not present

## 2020-12-24 DIAGNOSIS — N2581 Secondary hyperparathyroidism of renal origin: Secondary | ICD-10-CM | POA: Diagnosis not present

## 2020-12-24 DIAGNOSIS — Z992 Dependence on renal dialysis: Secondary | ICD-10-CM | POA: Diagnosis not present

## 2020-12-26 DIAGNOSIS — N186 End stage renal disease: Secondary | ICD-10-CM | POA: Diagnosis not present

## 2020-12-26 DIAGNOSIS — D509 Iron deficiency anemia, unspecified: Secondary | ICD-10-CM | POA: Diagnosis not present

## 2020-12-26 DIAGNOSIS — N2581 Secondary hyperparathyroidism of renal origin: Secondary | ICD-10-CM | POA: Diagnosis not present

## 2020-12-26 DIAGNOSIS — Z992 Dependence on renal dialysis: Secondary | ICD-10-CM | POA: Diagnosis not present

## 2020-12-26 DIAGNOSIS — D631 Anemia in chronic kidney disease: Secondary | ICD-10-CM | POA: Diagnosis not present

## 2020-12-26 DIAGNOSIS — E1122 Type 2 diabetes mellitus with diabetic chronic kidney disease: Secondary | ICD-10-CM | POA: Diagnosis not present

## 2020-12-28 DIAGNOSIS — N2581 Secondary hyperparathyroidism of renal origin: Secondary | ICD-10-CM | POA: Diagnosis not present

## 2020-12-28 DIAGNOSIS — N186 End stage renal disease: Secondary | ICD-10-CM | POA: Diagnosis not present

## 2020-12-28 DIAGNOSIS — D631 Anemia in chronic kidney disease: Secondary | ICD-10-CM | POA: Diagnosis not present

## 2020-12-28 DIAGNOSIS — Z992 Dependence on renal dialysis: Secondary | ICD-10-CM | POA: Diagnosis not present

## 2020-12-28 DIAGNOSIS — D509 Iron deficiency anemia, unspecified: Secondary | ICD-10-CM | POA: Diagnosis not present

## 2020-12-28 DIAGNOSIS — E1122 Type 2 diabetes mellitus with diabetic chronic kidney disease: Secondary | ICD-10-CM | POA: Diagnosis not present

## 2020-12-31 DIAGNOSIS — N186 End stage renal disease: Secondary | ICD-10-CM | POA: Diagnosis not present

## 2020-12-31 DIAGNOSIS — D509 Iron deficiency anemia, unspecified: Secondary | ICD-10-CM | POA: Diagnosis not present

## 2020-12-31 DIAGNOSIS — N2581 Secondary hyperparathyroidism of renal origin: Secondary | ICD-10-CM | POA: Diagnosis not present

## 2020-12-31 DIAGNOSIS — Z20822 Contact with and (suspected) exposure to covid-19: Secondary | ICD-10-CM | POA: Diagnosis not present

## 2020-12-31 DIAGNOSIS — D631 Anemia in chronic kidney disease: Secondary | ICD-10-CM | POA: Diagnosis not present

## 2020-12-31 DIAGNOSIS — E1122 Type 2 diabetes mellitus with diabetic chronic kidney disease: Secondary | ICD-10-CM | POA: Diagnosis not present

## 2020-12-31 DIAGNOSIS — Z992 Dependence on renal dialysis: Secondary | ICD-10-CM | POA: Diagnosis not present

## 2021-01-02 DIAGNOSIS — N2581 Secondary hyperparathyroidism of renal origin: Secondary | ICD-10-CM | POA: Diagnosis not present

## 2021-01-02 DIAGNOSIS — Z992 Dependence on renal dialysis: Secondary | ICD-10-CM | POA: Diagnosis not present

## 2021-01-02 DIAGNOSIS — D509 Iron deficiency anemia, unspecified: Secondary | ICD-10-CM | POA: Diagnosis not present

## 2021-01-02 DIAGNOSIS — N186 End stage renal disease: Secondary | ICD-10-CM | POA: Diagnosis not present

## 2021-01-02 DIAGNOSIS — D631 Anemia in chronic kidney disease: Secondary | ICD-10-CM | POA: Diagnosis not present

## 2021-01-02 DIAGNOSIS — E1122 Type 2 diabetes mellitus with diabetic chronic kidney disease: Secondary | ICD-10-CM | POA: Diagnosis not present

## 2021-01-04 DIAGNOSIS — D631 Anemia in chronic kidney disease: Secondary | ICD-10-CM | POA: Diagnosis not present

## 2021-01-04 DIAGNOSIS — Z992 Dependence on renal dialysis: Secondary | ICD-10-CM | POA: Diagnosis not present

## 2021-01-04 DIAGNOSIS — N2581 Secondary hyperparathyroidism of renal origin: Secondary | ICD-10-CM | POA: Diagnosis not present

## 2021-01-04 DIAGNOSIS — D509 Iron deficiency anemia, unspecified: Secondary | ICD-10-CM | POA: Diagnosis not present

## 2021-01-04 DIAGNOSIS — N186 End stage renal disease: Secondary | ICD-10-CM | POA: Diagnosis not present

## 2021-01-04 DIAGNOSIS — E1122 Type 2 diabetes mellitus with diabetic chronic kidney disease: Secondary | ICD-10-CM | POA: Diagnosis not present

## 2021-01-07 DIAGNOSIS — N2581 Secondary hyperparathyroidism of renal origin: Secondary | ICD-10-CM | POA: Diagnosis not present

## 2021-01-07 DIAGNOSIS — Z992 Dependence on renal dialysis: Secondary | ICD-10-CM | POA: Diagnosis not present

## 2021-01-07 DIAGNOSIS — E1122 Type 2 diabetes mellitus with diabetic chronic kidney disease: Secondary | ICD-10-CM | POA: Diagnosis not present

## 2021-01-07 DIAGNOSIS — D509 Iron deficiency anemia, unspecified: Secondary | ICD-10-CM | POA: Diagnosis not present

## 2021-01-07 DIAGNOSIS — D631 Anemia in chronic kidney disease: Secondary | ICD-10-CM | POA: Diagnosis not present

## 2021-01-07 DIAGNOSIS — N186 End stage renal disease: Secondary | ICD-10-CM | POA: Diagnosis not present

## 2021-01-10 DIAGNOSIS — D509 Iron deficiency anemia, unspecified: Secondary | ICD-10-CM | POA: Diagnosis not present

## 2021-01-10 DIAGNOSIS — E1122 Type 2 diabetes mellitus with diabetic chronic kidney disease: Secondary | ICD-10-CM | POA: Diagnosis not present

## 2021-01-10 DIAGNOSIS — N2581 Secondary hyperparathyroidism of renal origin: Secondary | ICD-10-CM | POA: Diagnosis not present

## 2021-01-10 DIAGNOSIS — Z992 Dependence on renal dialysis: Secondary | ICD-10-CM | POA: Diagnosis not present

## 2021-01-10 DIAGNOSIS — D631 Anemia in chronic kidney disease: Secondary | ICD-10-CM | POA: Diagnosis not present

## 2021-01-10 DIAGNOSIS — N186 End stage renal disease: Secondary | ICD-10-CM | POA: Diagnosis not present

## 2021-01-12 DIAGNOSIS — N2581 Secondary hyperparathyroidism of renal origin: Secondary | ICD-10-CM | POA: Diagnosis not present

## 2021-01-12 DIAGNOSIS — Z992 Dependence on renal dialysis: Secondary | ICD-10-CM | POA: Diagnosis not present

## 2021-01-12 DIAGNOSIS — D631 Anemia in chronic kidney disease: Secondary | ICD-10-CM | POA: Diagnosis not present

## 2021-01-12 DIAGNOSIS — D509 Iron deficiency anemia, unspecified: Secondary | ICD-10-CM | POA: Diagnosis not present

## 2021-01-12 DIAGNOSIS — E1122 Type 2 diabetes mellitus with diabetic chronic kidney disease: Secondary | ICD-10-CM | POA: Diagnosis not present

## 2021-01-12 DIAGNOSIS — N186 End stage renal disease: Secondary | ICD-10-CM | POA: Diagnosis not present

## 2021-01-14 DIAGNOSIS — Z992 Dependence on renal dialysis: Secondary | ICD-10-CM | POA: Diagnosis not present

## 2021-01-14 DIAGNOSIS — E1122 Type 2 diabetes mellitus with diabetic chronic kidney disease: Secondary | ICD-10-CM | POA: Diagnosis not present

## 2021-01-14 DIAGNOSIS — N186 End stage renal disease: Secondary | ICD-10-CM | POA: Diagnosis not present

## 2021-01-14 DIAGNOSIS — N2581 Secondary hyperparathyroidism of renal origin: Secondary | ICD-10-CM | POA: Diagnosis not present

## 2021-01-14 DIAGNOSIS — D509 Iron deficiency anemia, unspecified: Secondary | ICD-10-CM | POA: Diagnosis not present

## 2021-01-14 DIAGNOSIS — D631 Anemia in chronic kidney disease: Secondary | ICD-10-CM | POA: Diagnosis not present

## 2021-01-16 DIAGNOSIS — Z992 Dependence on renal dialysis: Secondary | ICD-10-CM | POA: Diagnosis not present

## 2021-01-16 DIAGNOSIS — E1122 Type 2 diabetes mellitus with diabetic chronic kidney disease: Secondary | ICD-10-CM | POA: Diagnosis not present

## 2021-01-16 DIAGNOSIS — N186 End stage renal disease: Secondary | ICD-10-CM | POA: Diagnosis not present

## 2021-01-16 DIAGNOSIS — I129 Hypertensive chronic kidney disease with stage 1 through stage 4 chronic kidney disease, or unspecified chronic kidney disease: Secondary | ICD-10-CM | POA: Diagnosis not present

## 2021-01-16 DIAGNOSIS — D631 Anemia in chronic kidney disease: Secondary | ICD-10-CM | POA: Diagnosis not present

## 2021-01-16 DIAGNOSIS — N2581 Secondary hyperparathyroidism of renal origin: Secondary | ICD-10-CM | POA: Diagnosis not present

## 2021-01-16 DIAGNOSIS — D509 Iron deficiency anemia, unspecified: Secondary | ICD-10-CM | POA: Diagnosis not present

## 2021-01-18 DIAGNOSIS — E1122 Type 2 diabetes mellitus with diabetic chronic kidney disease: Secondary | ICD-10-CM | POA: Diagnosis not present

## 2021-01-18 DIAGNOSIS — N2581 Secondary hyperparathyroidism of renal origin: Secondary | ICD-10-CM | POA: Diagnosis not present

## 2021-01-18 DIAGNOSIS — D509 Iron deficiency anemia, unspecified: Secondary | ICD-10-CM | POA: Diagnosis not present

## 2021-01-18 DIAGNOSIS — N186 End stage renal disease: Secondary | ICD-10-CM | POA: Diagnosis not present

## 2021-01-18 DIAGNOSIS — Z992 Dependence on renal dialysis: Secondary | ICD-10-CM | POA: Diagnosis not present

## 2021-01-18 DIAGNOSIS — D631 Anemia in chronic kidney disease: Secondary | ICD-10-CM | POA: Diagnosis not present

## 2021-01-21 DIAGNOSIS — N186 End stage renal disease: Secondary | ICD-10-CM | POA: Diagnosis not present

## 2021-01-21 DIAGNOSIS — N2581 Secondary hyperparathyroidism of renal origin: Secondary | ICD-10-CM | POA: Diagnosis not present

## 2021-01-21 DIAGNOSIS — D631 Anemia in chronic kidney disease: Secondary | ICD-10-CM | POA: Diagnosis not present

## 2021-01-21 DIAGNOSIS — Z992 Dependence on renal dialysis: Secondary | ICD-10-CM | POA: Diagnosis not present

## 2021-01-21 DIAGNOSIS — E1122 Type 2 diabetes mellitus with diabetic chronic kidney disease: Secondary | ICD-10-CM | POA: Diagnosis not present

## 2021-01-21 DIAGNOSIS — D509 Iron deficiency anemia, unspecified: Secondary | ICD-10-CM | POA: Diagnosis not present

## 2021-01-23 DIAGNOSIS — D509 Iron deficiency anemia, unspecified: Secondary | ICD-10-CM | POA: Diagnosis not present

## 2021-01-23 DIAGNOSIS — Z992 Dependence on renal dialysis: Secondary | ICD-10-CM | POA: Diagnosis not present

## 2021-01-23 DIAGNOSIS — D631 Anemia in chronic kidney disease: Secondary | ICD-10-CM | POA: Diagnosis not present

## 2021-01-23 DIAGNOSIS — N2581 Secondary hyperparathyroidism of renal origin: Secondary | ICD-10-CM | POA: Diagnosis not present

## 2021-01-23 DIAGNOSIS — E1122 Type 2 diabetes mellitus with diabetic chronic kidney disease: Secondary | ICD-10-CM | POA: Diagnosis not present

## 2021-01-23 DIAGNOSIS — N186 End stage renal disease: Secondary | ICD-10-CM | POA: Diagnosis not present

## 2021-01-25 DIAGNOSIS — D509 Iron deficiency anemia, unspecified: Secondary | ICD-10-CM | POA: Diagnosis not present

## 2021-01-25 DIAGNOSIS — N2581 Secondary hyperparathyroidism of renal origin: Secondary | ICD-10-CM | POA: Diagnosis not present

## 2021-01-25 DIAGNOSIS — Z992 Dependence on renal dialysis: Secondary | ICD-10-CM | POA: Diagnosis not present

## 2021-01-25 DIAGNOSIS — N186 End stage renal disease: Secondary | ICD-10-CM | POA: Diagnosis not present

## 2021-01-25 DIAGNOSIS — D631 Anemia in chronic kidney disease: Secondary | ICD-10-CM | POA: Diagnosis not present

## 2021-01-25 DIAGNOSIS — E1122 Type 2 diabetes mellitus with diabetic chronic kidney disease: Secondary | ICD-10-CM | POA: Diagnosis not present

## 2021-01-28 DIAGNOSIS — Z992 Dependence on renal dialysis: Secondary | ICD-10-CM | POA: Diagnosis not present

## 2021-01-28 DIAGNOSIS — N2581 Secondary hyperparathyroidism of renal origin: Secondary | ICD-10-CM | POA: Diagnosis not present

## 2021-01-28 DIAGNOSIS — E1122 Type 2 diabetes mellitus with diabetic chronic kidney disease: Secondary | ICD-10-CM | POA: Diagnosis not present

## 2021-01-28 DIAGNOSIS — D509 Iron deficiency anemia, unspecified: Secondary | ICD-10-CM | POA: Diagnosis not present

## 2021-01-28 DIAGNOSIS — N186 End stage renal disease: Secondary | ICD-10-CM | POA: Diagnosis not present

## 2021-01-28 DIAGNOSIS — D631 Anemia in chronic kidney disease: Secondary | ICD-10-CM | POA: Diagnosis not present

## 2021-01-30 DIAGNOSIS — N2581 Secondary hyperparathyroidism of renal origin: Secondary | ICD-10-CM | POA: Diagnosis not present

## 2021-01-30 DIAGNOSIS — N186 End stage renal disease: Secondary | ICD-10-CM | POA: Diagnosis not present

## 2021-01-30 DIAGNOSIS — E1122 Type 2 diabetes mellitus with diabetic chronic kidney disease: Secondary | ICD-10-CM | POA: Diagnosis not present

## 2021-01-30 DIAGNOSIS — D509 Iron deficiency anemia, unspecified: Secondary | ICD-10-CM | POA: Diagnosis not present

## 2021-01-30 DIAGNOSIS — Z992 Dependence on renal dialysis: Secondary | ICD-10-CM | POA: Diagnosis not present

## 2021-01-30 DIAGNOSIS — D631 Anemia in chronic kidney disease: Secondary | ICD-10-CM | POA: Diagnosis not present

## 2021-02-01 DIAGNOSIS — N186 End stage renal disease: Secondary | ICD-10-CM | POA: Diagnosis not present

## 2021-02-01 DIAGNOSIS — D509 Iron deficiency anemia, unspecified: Secondary | ICD-10-CM | POA: Diagnosis not present

## 2021-02-01 DIAGNOSIS — D631 Anemia in chronic kidney disease: Secondary | ICD-10-CM | POA: Diagnosis not present

## 2021-02-01 DIAGNOSIS — N2581 Secondary hyperparathyroidism of renal origin: Secondary | ICD-10-CM | POA: Diagnosis not present

## 2021-02-01 DIAGNOSIS — E1122 Type 2 diabetes mellitus with diabetic chronic kidney disease: Secondary | ICD-10-CM | POA: Diagnosis not present

## 2021-02-01 DIAGNOSIS — Z992 Dependence on renal dialysis: Secondary | ICD-10-CM | POA: Diagnosis not present

## 2021-02-04 DIAGNOSIS — E1122 Type 2 diabetes mellitus with diabetic chronic kidney disease: Secondary | ICD-10-CM | POA: Diagnosis not present

## 2021-02-04 DIAGNOSIS — N186 End stage renal disease: Secondary | ICD-10-CM | POA: Diagnosis not present

## 2021-02-04 DIAGNOSIS — D509 Iron deficiency anemia, unspecified: Secondary | ICD-10-CM | POA: Diagnosis not present

## 2021-02-04 DIAGNOSIS — N2581 Secondary hyperparathyroidism of renal origin: Secondary | ICD-10-CM | POA: Diagnosis not present

## 2021-02-04 DIAGNOSIS — Z992 Dependence on renal dialysis: Secondary | ICD-10-CM | POA: Diagnosis not present

## 2021-02-04 DIAGNOSIS — D631 Anemia in chronic kidney disease: Secondary | ICD-10-CM | POA: Diagnosis not present

## 2021-02-06 DIAGNOSIS — D509 Iron deficiency anemia, unspecified: Secondary | ICD-10-CM | POA: Diagnosis not present

## 2021-02-06 DIAGNOSIS — Z992 Dependence on renal dialysis: Secondary | ICD-10-CM | POA: Diagnosis not present

## 2021-02-06 DIAGNOSIS — N186 End stage renal disease: Secondary | ICD-10-CM | POA: Diagnosis not present

## 2021-02-06 DIAGNOSIS — D631 Anemia in chronic kidney disease: Secondary | ICD-10-CM | POA: Diagnosis not present

## 2021-02-06 DIAGNOSIS — E1122 Type 2 diabetes mellitus with diabetic chronic kidney disease: Secondary | ICD-10-CM | POA: Diagnosis not present

## 2021-02-06 DIAGNOSIS — N2581 Secondary hyperparathyroidism of renal origin: Secondary | ICD-10-CM | POA: Diagnosis not present

## 2021-02-08 DIAGNOSIS — N2581 Secondary hyperparathyroidism of renal origin: Secondary | ICD-10-CM | POA: Diagnosis not present

## 2021-02-08 DIAGNOSIS — N186 End stage renal disease: Secondary | ICD-10-CM | POA: Diagnosis not present

## 2021-02-08 DIAGNOSIS — D509 Iron deficiency anemia, unspecified: Secondary | ICD-10-CM | POA: Diagnosis not present

## 2021-02-08 DIAGNOSIS — Z992 Dependence on renal dialysis: Secondary | ICD-10-CM | POA: Diagnosis not present

## 2021-02-08 DIAGNOSIS — E1122 Type 2 diabetes mellitus with diabetic chronic kidney disease: Secondary | ICD-10-CM | POA: Diagnosis not present

## 2021-02-08 DIAGNOSIS — D631 Anemia in chronic kidney disease: Secondary | ICD-10-CM | POA: Diagnosis not present

## 2021-02-11 DIAGNOSIS — D631 Anemia in chronic kidney disease: Secondary | ICD-10-CM | POA: Diagnosis not present

## 2021-02-11 DIAGNOSIS — E1122 Type 2 diabetes mellitus with diabetic chronic kidney disease: Secondary | ICD-10-CM | POA: Diagnosis not present

## 2021-02-11 DIAGNOSIS — Z992 Dependence on renal dialysis: Secondary | ICD-10-CM | POA: Diagnosis not present

## 2021-02-11 DIAGNOSIS — D509 Iron deficiency anemia, unspecified: Secondary | ICD-10-CM | POA: Diagnosis not present

## 2021-02-11 DIAGNOSIS — N2581 Secondary hyperparathyroidism of renal origin: Secondary | ICD-10-CM | POA: Diagnosis not present

## 2021-02-11 DIAGNOSIS — N186 End stage renal disease: Secondary | ICD-10-CM | POA: Diagnosis not present

## 2021-02-13 DIAGNOSIS — D631 Anemia in chronic kidney disease: Secondary | ICD-10-CM | POA: Diagnosis not present

## 2021-02-13 DIAGNOSIS — D509 Iron deficiency anemia, unspecified: Secondary | ICD-10-CM | POA: Diagnosis not present

## 2021-02-13 DIAGNOSIS — N2581 Secondary hyperparathyroidism of renal origin: Secondary | ICD-10-CM | POA: Diagnosis not present

## 2021-02-13 DIAGNOSIS — Z992 Dependence on renal dialysis: Secondary | ICD-10-CM | POA: Diagnosis not present

## 2021-02-13 DIAGNOSIS — N186 End stage renal disease: Secondary | ICD-10-CM | POA: Diagnosis not present

## 2021-02-13 DIAGNOSIS — E1122 Type 2 diabetes mellitus with diabetic chronic kidney disease: Secondary | ICD-10-CM | POA: Diagnosis not present

## 2021-02-15 DIAGNOSIS — D631 Anemia in chronic kidney disease: Secondary | ICD-10-CM | POA: Diagnosis not present

## 2021-02-15 DIAGNOSIS — D509 Iron deficiency anemia, unspecified: Secondary | ICD-10-CM | POA: Diagnosis not present

## 2021-02-15 DIAGNOSIS — E1122 Type 2 diabetes mellitus with diabetic chronic kidney disease: Secondary | ICD-10-CM | POA: Diagnosis not present

## 2021-02-15 DIAGNOSIS — Z992 Dependence on renal dialysis: Secondary | ICD-10-CM | POA: Diagnosis not present

## 2021-02-15 DIAGNOSIS — N186 End stage renal disease: Secondary | ICD-10-CM | POA: Diagnosis not present

## 2021-02-15 DIAGNOSIS — N2581 Secondary hyperparathyroidism of renal origin: Secondary | ICD-10-CM | POA: Diagnosis not present

## 2021-02-16 DIAGNOSIS — N186 End stage renal disease: Secondary | ICD-10-CM | POA: Diagnosis not present

## 2021-02-16 DIAGNOSIS — Z992 Dependence on renal dialysis: Secondary | ICD-10-CM | POA: Diagnosis not present

## 2021-02-16 DIAGNOSIS — I129 Hypertensive chronic kidney disease with stage 1 through stage 4 chronic kidney disease, or unspecified chronic kidney disease: Secondary | ICD-10-CM | POA: Diagnosis not present

## 2021-02-18 DIAGNOSIS — N186 End stage renal disease: Secondary | ICD-10-CM | POA: Diagnosis not present

## 2021-02-18 DIAGNOSIS — Z992 Dependence on renal dialysis: Secondary | ICD-10-CM | POA: Diagnosis not present

## 2021-02-18 DIAGNOSIS — D631 Anemia in chronic kidney disease: Secondary | ICD-10-CM | POA: Diagnosis not present

## 2021-02-18 DIAGNOSIS — N2581 Secondary hyperparathyroidism of renal origin: Secondary | ICD-10-CM | POA: Diagnosis not present

## 2021-02-18 DIAGNOSIS — D509 Iron deficiency anemia, unspecified: Secondary | ICD-10-CM | POA: Diagnosis not present

## 2021-02-18 DIAGNOSIS — E1122 Type 2 diabetes mellitus with diabetic chronic kidney disease: Secondary | ICD-10-CM | POA: Diagnosis not present

## 2021-02-20 DIAGNOSIS — Z992 Dependence on renal dialysis: Secondary | ICD-10-CM | POA: Diagnosis not present

## 2021-02-20 DIAGNOSIS — D509 Iron deficiency anemia, unspecified: Secondary | ICD-10-CM | POA: Diagnosis not present

## 2021-02-20 DIAGNOSIS — N186 End stage renal disease: Secondary | ICD-10-CM | POA: Diagnosis not present

## 2021-02-20 DIAGNOSIS — D631 Anemia in chronic kidney disease: Secondary | ICD-10-CM | POA: Diagnosis not present

## 2021-02-20 DIAGNOSIS — E1122 Type 2 diabetes mellitus with diabetic chronic kidney disease: Secondary | ICD-10-CM | POA: Diagnosis not present

## 2021-02-20 DIAGNOSIS — N2581 Secondary hyperparathyroidism of renal origin: Secondary | ICD-10-CM | POA: Diagnosis not present

## 2021-02-22 DIAGNOSIS — Z992 Dependence on renal dialysis: Secondary | ICD-10-CM | POA: Diagnosis not present

## 2021-02-22 DIAGNOSIS — N186 End stage renal disease: Secondary | ICD-10-CM | POA: Diagnosis not present

## 2021-02-22 DIAGNOSIS — D631 Anemia in chronic kidney disease: Secondary | ICD-10-CM | POA: Diagnosis not present

## 2021-02-22 DIAGNOSIS — D509 Iron deficiency anemia, unspecified: Secondary | ICD-10-CM | POA: Diagnosis not present

## 2021-02-22 DIAGNOSIS — E1122 Type 2 diabetes mellitus with diabetic chronic kidney disease: Secondary | ICD-10-CM | POA: Diagnosis not present

## 2021-02-22 DIAGNOSIS — N2581 Secondary hyperparathyroidism of renal origin: Secondary | ICD-10-CM | POA: Diagnosis not present

## 2021-02-24 ENCOUNTER — Ambulatory Visit: Payer: Medicare Other | Admitting: Cardiovascular Disease

## 2021-02-25 DIAGNOSIS — D631 Anemia in chronic kidney disease: Secondary | ICD-10-CM | POA: Diagnosis not present

## 2021-02-25 DIAGNOSIS — N186 End stage renal disease: Secondary | ICD-10-CM | POA: Diagnosis not present

## 2021-02-25 DIAGNOSIS — D509 Iron deficiency anemia, unspecified: Secondary | ICD-10-CM | POA: Diagnosis not present

## 2021-02-25 DIAGNOSIS — N2581 Secondary hyperparathyroidism of renal origin: Secondary | ICD-10-CM | POA: Diagnosis not present

## 2021-02-25 DIAGNOSIS — E1122 Type 2 diabetes mellitus with diabetic chronic kidney disease: Secondary | ICD-10-CM | POA: Diagnosis not present

## 2021-02-25 DIAGNOSIS — Z992 Dependence on renal dialysis: Secondary | ICD-10-CM | POA: Diagnosis not present

## 2021-02-26 DIAGNOSIS — I10 Essential (primary) hypertension: Secondary | ICD-10-CM | POA: Diagnosis not present

## 2021-02-26 DIAGNOSIS — Z125 Encounter for screening for malignant neoplasm of prostate: Secondary | ICD-10-CM | POA: Diagnosis not present

## 2021-02-26 DIAGNOSIS — M109 Gout, unspecified: Secondary | ICD-10-CM | POA: Diagnosis not present

## 2021-02-26 DIAGNOSIS — E1169 Type 2 diabetes mellitus with other specified complication: Secondary | ICD-10-CM | POA: Diagnosis not present

## 2021-02-26 DIAGNOSIS — E213 Hyperparathyroidism, unspecified: Secondary | ICD-10-CM | POA: Diagnosis not present

## 2021-02-26 DIAGNOSIS — E785 Hyperlipidemia, unspecified: Secondary | ICD-10-CM | POA: Diagnosis not present

## 2021-02-27 DIAGNOSIS — D631 Anemia in chronic kidney disease: Secondary | ICD-10-CM | POA: Diagnosis not present

## 2021-02-27 DIAGNOSIS — N186 End stage renal disease: Secondary | ICD-10-CM | POA: Diagnosis not present

## 2021-02-27 DIAGNOSIS — D509 Iron deficiency anemia, unspecified: Secondary | ICD-10-CM | POA: Diagnosis not present

## 2021-02-27 DIAGNOSIS — E1122 Type 2 diabetes mellitus with diabetic chronic kidney disease: Secondary | ICD-10-CM | POA: Diagnosis not present

## 2021-02-27 DIAGNOSIS — Z992 Dependence on renal dialysis: Secondary | ICD-10-CM | POA: Diagnosis not present

## 2021-02-27 DIAGNOSIS — N2581 Secondary hyperparathyroidism of renal origin: Secondary | ICD-10-CM | POA: Diagnosis not present

## 2021-02-28 ENCOUNTER — Ambulatory Visit (INDEPENDENT_AMBULATORY_CARE_PROVIDER_SITE_OTHER): Payer: Medicare Other

## 2021-02-28 DIAGNOSIS — I495 Sick sinus syndrome: Secondary | ICD-10-CM

## 2021-02-28 LAB — CUP PACEART REMOTE DEVICE CHECK
Battery Remaining Longevity: 126 mo
Battery Voltage: 3.03 V
Brady Statistic AP VP Percent: 0.01 %
Brady Statistic AP VS Percent: 13.58 %
Brady Statistic AS VP Percent: 0.04 %
Brady Statistic AS VS Percent: 86.37 %
Brady Statistic RA Percent Paced: 14.02 %
Brady Statistic RV Percent Paced: 0.05 %
Date Time Interrogation Session: 20230112203939
Implantable Lead Implant Date: 20020325
Implantable Lead Implant Date: 20020325
Implantable Lead Location: 753859
Implantable Lead Location: 753860
Implantable Lead Model: 4092
Implantable Lead Model: 4524
Implantable Pulse Generator Implant Date: 20210415
Lead Channel Impedance Value: 247 Ohm
Lead Channel Impedance Value: 285 Ohm
Lead Channel Impedance Value: 456 Ohm
Lead Channel Impedance Value: 570 Ohm
Lead Channel Pacing Threshold Amplitude: 0.625 V
Lead Channel Pacing Threshold Amplitude: 2.5 V
Lead Channel Pacing Threshold Pulse Width: 0.4 ms
Lead Channel Pacing Threshold Pulse Width: 0.4 ms
Lead Channel Sensing Intrinsic Amplitude: 0.625 mV
Lead Channel Sensing Intrinsic Amplitude: 0.625 mV
Lead Channel Sensing Intrinsic Amplitude: 22.625 mV
Lead Channel Sensing Intrinsic Amplitude: 22.625 mV
Lead Channel Setting Pacing Amplitude: 2.5 V
Lead Channel Setting Pacing Amplitude: 3 V
Lead Channel Setting Pacing Pulse Width: 0.4 ms
Lead Channel Setting Sensing Sensitivity: 1.2 mV

## 2021-03-01 DIAGNOSIS — D631 Anemia in chronic kidney disease: Secondary | ICD-10-CM | POA: Diagnosis not present

## 2021-03-01 DIAGNOSIS — N186 End stage renal disease: Secondary | ICD-10-CM | POA: Diagnosis not present

## 2021-03-01 DIAGNOSIS — Z992 Dependence on renal dialysis: Secondary | ICD-10-CM | POA: Diagnosis not present

## 2021-03-01 DIAGNOSIS — N2581 Secondary hyperparathyroidism of renal origin: Secondary | ICD-10-CM | POA: Diagnosis not present

## 2021-03-01 DIAGNOSIS — E1122 Type 2 diabetes mellitus with diabetic chronic kidney disease: Secondary | ICD-10-CM | POA: Diagnosis not present

## 2021-03-01 DIAGNOSIS — D509 Iron deficiency anemia, unspecified: Secondary | ICD-10-CM | POA: Diagnosis not present

## 2021-03-03 DIAGNOSIS — I770 Arteriovenous fistula, acquired: Secondary | ICD-10-CM | POA: Diagnosis not present

## 2021-03-03 DIAGNOSIS — E1169 Type 2 diabetes mellitus with other specified complication: Secondary | ICD-10-CM | POA: Diagnosis not present

## 2021-03-03 DIAGNOSIS — I5032 Chronic diastolic (congestive) heart failure: Secondary | ICD-10-CM | POA: Diagnosis not present

## 2021-03-03 DIAGNOSIS — Z23 Encounter for immunization: Secondary | ICD-10-CM | POA: Diagnosis not present

## 2021-03-03 DIAGNOSIS — Z0000 Encounter for general adult medical examination without abnormal findings: Secondary | ICD-10-CM | POA: Diagnosis not present

## 2021-03-03 DIAGNOSIS — N186 End stage renal disease: Secondary | ICD-10-CM | POA: Diagnosis not present

## 2021-03-03 DIAGNOSIS — E213 Hyperparathyroidism, unspecified: Secondary | ICD-10-CM | POA: Diagnosis not present

## 2021-03-03 DIAGNOSIS — Z1331 Encounter for screening for depression: Secondary | ICD-10-CM | POA: Diagnosis not present

## 2021-03-03 DIAGNOSIS — Z992 Dependence on renal dialysis: Secondary | ICD-10-CM | POA: Diagnosis not present

## 2021-03-03 DIAGNOSIS — Z1339 Encounter for screening examination for other mental health and behavioral disorders: Secondary | ICD-10-CM | POA: Diagnosis not present

## 2021-03-03 DIAGNOSIS — Z794 Long term (current) use of insulin: Secondary | ICD-10-CM | POA: Diagnosis not present

## 2021-03-03 DIAGNOSIS — G629 Polyneuropathy, unspecified: Secondary | ICD-10-CM | POA: Diagnosis not present

## 2021-03-03 DIAGNOSIS — I700 Atherosclerosis of aorta: Secondary | ICD-10-CM | POA: Diagnosis not present

## 2021-03-03 DIAGNOSIS — I25810 Atherosclerosis of coronary artery bypass graft(s) without angina pectoris: Secondary | ICD-10-CM | POA: Diagnosis not present

## 2021-03-03 DIAGNOSIS — I120 Hypertensive chronic kidney disease with stage 5 chronic kidney disease or end stage renal disease: Secondary | ICD-10-CM | POA: Diagnosis not present

## 2021-03-04 DIAGNOSIS — E1122 Type 2 diabetes mellitus with diabetic chronic kidney disease: Secondary | ICD-10-CM | POA: Diagnosis not present

## 2021-03-04 DIAGNOSIS — Z992 Dependence on renal dialysis: Secondary | ICD-10-CM | POA: Diagnosis not present

## 2021-03-04 DIAGNOSIS — N2581 Secondary hyperparathyroidism of renal origin: Secondary | ICD-10-CM | POA: Diagnosis not present

## 2021-03-04 DIAGNOSIS — N186 End stage renal disease: Secondary | ICD-10-CM | POA: Diagnosis not present

## 2021-03-04 DIAGNOSIS — D509 Iron deficiency anemia, unspecified: Secondary | ICD-10-CM | POA: Diagnosis not present

## 2021-03-04 DIAGNOSIS — D631 Anemia in chronic kidney disease: Secondary | ICD-10-CM | POA: Diagnosis not present

## 2021-03-06 DIAGNOSIS — N186 End stage renal disease: Secondary | ICD-10-CM | POA: Diagnosis not present

## 2021-03-06 DIAGNOSIS — D631 Anemia in chronic kidney disease: Secondary | ICD-10-CM | POA: Diagnosis not present

## 2021-03-06 DIAGNOSIS — E1122 Type 2 diabetes mellitus with diabetic chronic kidney disease: Secondary | ICD-10-CM | POA: Diagnosis not present

## 2021-03-06 DIAGNOSIS — E039 Hypothyroidism, unspecified: Secondary | ICD-10-CM | POA: Diagnosis not present

## 2021-03-06 DIAGNOSIS — D509 Iron deficiency anemia, unspecified: Secondary | ICD-10-CM | POA: Diagnosis not present

## 2021-03-06 DIAGNOSIS — Z992 Dependence on renal dialysis: Secondary | ICD-10-CM | POA: Diagnosis not present

## 2021-03-06 DIAGNOSIS — N2581 Secondary hyperparathyroidism of renal origin: Secondary | ICD-10-CM | POA: Diagnosis not present

## 2021-03-08 DIAGNOSIS — E1122 Type 2 diabetes mellitus with diabetic chronic kidney disease: Secondary | ICD-10-CM | POA: Diagnosis not present

## 2021-03-08 DIAGNOSIS — N186 End stage renal disease: Secondary | ICD-10-CM | POA: Diagnosis not present

## 2021-03-08 DIAGNOSIS — D509 Iron deficiency anemia, unspecified: Secondary | ICD-10-CM | POA: Diagnosis not present

## 2021-03-08 DIAGNOSIS — N2581 Secondary hyperparathyroidism of renal origin: Secondary | ICD-10-CM | POA: Diagnosis not present

## 2021-03-08 DIAGNOSIS — Z992 Dependence on renal dialysis: Secondary | ICD-10-CM | POA: Diagnosis not present

## 2021-03-08 DIAGNOSIS — D631 Anemia in chronic kidney disease: Secondary | ICD-10-CM | POA: Diagnosis not present

## 2021-03-11 DIAGNOSIS — N186 End stage renal disease: Secondary | ICD-10-CM | POA: Diagnosis not present

## 2021-03-11 DIAGNOSIS — N2581 Secondary hyperparathyroidism of renal origin: Secondary | ICD-10-CM | POA: Diagnosis not present

## 2021-03-11 DIAGNOSIS — D631 Anemia in chronic kidney disease: Secondary | ICD-10-CM | POA: Diagnosis not present

## 2021-03-11 DIAGNOSIS — E1122 Type 2 diabetes mellitus with diabetic chronic kidney disease: Secondary | ICD-10-CM | POA: Diagnosis not present

## 2021-03-11 DIAGNOSIS — Z992 Dependence on renal dialysis: Secondary | ICD-10-CM | POA: Diagnosis not present

## 2021-03-11 DIAGNOSIS — D509 Iron deficiency anemia, unspecified: Secondary | ICD-10-CM | POA: Diagnosis not present

## 2021-03-11 NOTE — Progress Notes (Signed)
Remote pacemaker transmission.   

## 2021-03-13 DIAGNOSIS — N186 End stage renal disease: Secondary | ICD-10-CM | POA: Diagnosis not present

## 2021-03-13 DIAGNOSIS — D509 Iron deficiency anemia, unspecified: Secondary | ICD-10-CM | POA: Diagnosis not present

## 2021-03-13 DIAGNOSIS — D631 Anemia in chronic kidney disease: Secondary | ICD-10-CM | POA: Diagnosis not present

## 2021-03-13 DIAGNOSIS — N2581 Secondary hyperparathyroidism of renal origin: Secondary | ICD-10-CM | POA: Diagnosis not present

## 2021-03-13 DIAGNOSIS — E1122 Type 2 diabetes mellitus with diabetic chronic kidney disease: Secondary | ICD-10-CM | POA: Diagnosis not present

## 2021-03-13 DIAGNOSIS — Z992 Dependence on renal dialysis: Secondary | ICD-10-CM | POA: Diagnosis not present

## 2021-03-15 DIAGNOSIS — E1122 Type 2 diabetes mellitus with diabetic chronic kidney disease: Secondary | ICD-10-CM | POA: Diagnosis not present

## 2021-03-15 DIAGNOSIS — D509 Iron deficiency anemia, unspecified: Secondary | ICD-10-CM | POA: Diagnosis not present

## 2021-03-15 DIAGNOSIS — N2581 Secondary hyperparathyroidism of renal origin: Secondary | ICD-10-CM | POA: Diagnosis not present

## 2021-03-15 DIAGNOSIS — D631 Anemia in chronic kidney disease: Secondary | ICD-10-CM | POA: Diagnosis not present

## 2021-03-15 DIAGNOSIS — Z992 Dependence on renal dialysis: Secondary | ICD-10-CM | POA: Diagnosis not present

## 2021-03-15 DIAGNOSIS — N186 End stage renal disease: Secondary | ICD-10-CM | POA: Diagnosis not present

## 2021-03-18 DIAGNOSIS — D631 Anemia in chronic kidney disease: Secondary | ICD-10-CM | POA: Diagnosis not present

## 2021-03-18 DIAGNOSIS — E1122 Type 2 diabetes mellitus with diabetic chronic kidney disease: Secondary | ICD-10-CM | POA: Diagnosis not present

## 2021-03-18 DIAGNOSIS — N186 End stage renal disease: Secondary | ICD-10-CM | POA: Diagnosis not present

## 2021-03-18 DIAGNOSIS — D509 Iron deficiency anemia, unspecified: Secondary | ICD-10-CM | POA: Diagnosis not present

## 2021-03-18 DIAGNOSIS — N2581 Secondary hyperparathyroidism of renal origin: Secondary | ICD-10-CM | POA: Diagnosis not present

## 2021-03-18 DIAGNOSIS — Z992 Dependence on renal dialysis: Secondary | ICD-10-CM | POA: Diagnosis not present

## 2021-03-19 DIAGNOSIS — N186 End stage renal disease: Secondary | ICD-10-CM | POA: Diagnosis not present

## 2021-03-19 DIAGNOSIS — Z992 Dependence on renal dialysis: Secondary | ICD-10-CM | POA: Diagnosis not present

## 2021-03-19 DIAGNOSIS — I129 Hypertensive chronic kidney disease with stage 1 through stage 4 chronic kidney disease, or unspecified chronic kidney disease: Secondary | ICD-10-CM | POA: Diagnosis not present

## 2021-03-20 DIAGNOSIS — N2581 Secondary hyperparathyroidism of renal origin: Secondary | ICD-10-CM | POA: Diagnosis not present

## 2021-03-20 DIAGNOSIS — D631 Anemia in chronic kidney disease: Secondary | ICD-10-CM | POA: Diagnosis not present

## 2021-03-20 DIAGNOSIS — E1122 Type 2 diabetes mellitus with diabetic chronic kidney disease: Secondary | ICD-10-CM | POA: Diagnosis not present

## 2021-03-20 DIAGNOSIS — D509 Iron deficiency anemia, unspecified: Secondary | ICD-10-CM | POA: Diagnosis not present

## 2021-03-20 DIAGNOSIS — N186 End stage renal disease: Secondary | ICD-10-CM | POA: Diagnosis not present

## 2021-03-20 DIAGNOSIS — Z992 Dependence on renal dialysis: Secondary | ICD-10-CM | POA: Diagnosis not present

## 2021-03-22 DIAGNOSIS — D631 Anemia in chronic kidney disease: Secondary | ICD-10-CM | POA: Diagnosis not present

## 2021-03-22 DIAGNOSIS — N186 End stage renal disease: Secondary | ICD-10-CM | POA: Diagnosis not present

## 2021-03-22 DIAGNOSIS — D509 Iron deficiency anemia, unspecified: Secondary | ICD-10-CM | POA: Diagnosis not present

## 2021-03-22 DIAGNOSIS — E1122 Type 2 diabetes mellitus with diabetic chronic kidney disease: Secondary | ICD-10-CM | POA: Diagnosis not present

## 2021-03-22 DIAGNOSIS — Z992 Dependence on renal dialysis: Secondary | ICD-10-CM | POA: Diagnosis not present

## 2021-03-22 DIAGNOSIS — N2581 Secondary hyperparathyroidism of renal origin: Secondary | ICD-10-CM | POA: Diagnosis not present

## 2021-03-25 DIAGNOSIS — N186 End stage renal disease: Secondary | ICD-10-CM | POA: Diagnosis not present

## 2021-03-25 DIAGNOSIS — D631 Anemia in chronic kidney disease: Secondary | ICD-10-CM | POA: Diagnosis not present

## 2021-03-25 DIAGNOSIS — N2581 Secondary hyperparathyroidism of renal origin: Secondary | ICD-10-CM | POA: Diagnosis not present

## 2021-03-25 DIAGNOSIS — D509 Iron deficiency anemia, unspecified: Secondary | ICD-10-CM | POA: Diagnosis not present

## 2021-03-25 DIAGNOSIS — E1122 Type 2 diabetes mellitus with diabetic chronic kidney disease: Secondary | ICD-10-CM | POA: Diagnosis not present

## 2021-03-25 DIAGNOSIS — Z992 Dependence on renal dialysis: Secondary | ICD-10-CM | POA: Diagnosis not present

## 2021-03-27 DIAGNOSIS — D509 Iron deficiency anemia, unspecified: Secondary | ICD-10-CM | POA: Diagnosis not present

## 2021-03-27 DIAGNOSIS — Z992 Dependence on renal dialysis: Secondary | ICD-10-CM | POA: Diagnosis not present

## 2021-03-27 DIAGNOSIS — D631 Anemia in chronic kidney disease: Secondary | ICD-10-CM | POA: Diagnosis not present

## 2021-03-27 DIAGNOSIS — N186 End stage renal disease: Secondary | ICD-10-CM | POA: Diagnosis not present

## 2021-03-27 DIAGNOSIS — E1122 Type 2 diabetes mellitus with diabetic chronic kidney disease: Secondary | ICD-10-CM | POA: Diagnosis not present

## 2021-03-27 DIAGNOSIS — N2581 Secondary hyperparathyroidism of renal origin: Secondary | ICD-10-CM | POA: Diagnosis not present

## 2021-03-29 DIAGNOSIS — N2581 Secondary hyperparathyroidism of renal origin: Secondary | ICD-10-CM | POA: Diagnosis not present

## 2021-03-29 DIAGNOSIS — D509 Iron deficiency anemia, unspecified: Secondary | ICD-10-CM | POA: Diagnosis not present

## 2021-03-29 DIAGNOSIS — N186 End stage renal disease: Secondary | ICD-10-CM | POA: Diagnosis not present

## 2021-03-29 DIAGNOSIS — D631 Anemia in chronic kidney disease: Secondary | ICD-10-CM | POA: Diagnosis not present

## 2021-03-29 DIAGNOSIS — Z992 Dependence on renal dialysis: Secondary | ICD-10-CM | POA: Diagnosis not present

## 2021-03-29 DIAGNOSIS — E1122 Type 2 diabetes mellitus with diabetic chronic kidney disease: Secondary | ICD-10-CM | POA: Diagnosis not present

## 2021-04-01 DIAGNOSIS — N2581 Secondary hyperparathyroidism of renal origin: Secondary | ICD-10-CM | POA: Diagnosis not present

## 2021-04-01 DIAGNOSIS — D631 Anemia in chronic kidney disease: Secondary | ICD-10-CM | POA: Diagnosis not present

## 2021-04-01 DIAGNOSIS — N186 End stage renal disease: Secondary | ICD-10-CM | POA: Diagnosis not present

## 2021-04-01 DIAGNOSIS — D509 Iron deficiency anemia, unspecified: Secondary | ICD-10-CM | POA: Diagnosis not present

## 2021-04-01 DIAGNOSIS — E1122 Type 2 diabetes mellitus with diabetic chronic kidney disease: Secondary | ICD-10-CM | POA: Diagnosis not present

## 2021-04-01 DIAGNOSIS — Z992 Dependence on renal dialysis: Secondary | ICD-10-CM | POA: Diagnosis not present

## 2021-04-03 ENCOUNTER — Encounter: Payer: Self-pay | Admitting: Cardiovascular Disease

## 2021-04-03 DIAGNOSIS — N186 End stage renal disease: Secondary | ICD-10-CM | POA: Diagnosis not present

## 2021-04-03 DIAGNOSIS — D631 Anemia in chronic kidney disease: Secondary | ICD-10-CM | POA: Diagnosis not present

## 2021-04-03 DIAGNOSIS — E1122 Type 2 diabetes mellitus with diabetic chronic kidney disease: Secondary | ICD-10-CM | POA: Diagnosis not present

## 2021-04-03 DIAGNOSIS — N2581 Secondary hyperparathyroidism of renal origin: Secondary | ICD-10-CM | POA: Diagnosis not present

## 2021-04-03 DIAGNOSIS — Z992 Dependence on renal dialysis: Secondary | ICD-10-CM | POA: Diagnosis not present

## 2021-04-03 DIAGNOSIS — D509 Iron deficiency anemia, unspecified: Secondary | ICD-10-CM | POA: Diagnosis not present

## 2021-04-03 NOTE — Progress Notes (Signed)
Bradley Soto Date of Birth  05-29-1940       Bradley Soto    [employer] [address], [place], [place]  [address]   [place], [place]  [address] [place]   Fax  [phone]     Fax [phone]  Problem List: 1. Coronary artery disease-status post stenting 2. Sick sinus syndrome-status post pacemaker. History of paroxysmal  atrial fib 3. CKD 4. Diabetes Mellitus 5. Gout 6.   Previoius Hx  Pt recently moved from [place], [place] ( near [employer])  Here to establish cardiology care. 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    March 25, 2017: Bradley Soto is seen today for a follow-up visit.  I last saw him 4 years ago.  He has been seeing Dr. Rayann Heman in the meantime.  Hx of stenting in the past  Was just in the [place] - had the flu.  ( did get the flu shot)  Echocardiogram while in the [place] shows normal left ventricular systolic function with ejection fraction of 60-65%.  He has mild left ventricular hypertrophy.  There is akinesis of the inferior basilar segment.  He has grade 1 diastolic.  dysfunction.  There is mild aortic stenosis.  Now has  No CP . Has some shortness of breath ,  Worse in the cold.   Has gained some weight - since being on Insulin   Jan. 29, 2020   Doing well  He describes having some palpitations that lasted about a month.  He thinks they started around mid December and lasted through the first part of January. It is him as needed for these palpitations.  They seem to have resolved.  He was wondering if they are related to his Tylenol PM. Today shows atrial pacing.  There is no arrhythmias. Is not exercising   Oct. 12, 2020   Bradley Soto is seen today for follow up . Hx of previous CAD and previous coronary stenting .   Bradley Soto was seen in the ER on Oct. 8 but left the waiting room because of prolonged wait time. Has mid  sternal chest pain . Seems worse with movement,  No radiation.  Did not have NTG to take   CP started a week ago .    Has occurred 3-4 times.  Woke up with CP last night Typically occurs at night while sleeping .  Does have CP while walking to the mailbox   Will last for several minutes.   Center of chest,  Radiates to his throat.  This pain is very similar to his previous episodes of CP  Pain was relieved with SL NTG   Not associated with deep breath , not associated with twisting his body .    December 23, 2018: Bradley Soto is seen today for a follow-up visit.  He was having some chest pain when I saw him last month and we referred him for heart catheterization.  Ramus lesion is 99% stenosed. Prox LAD to Mid LAD lesion is 70% stenosed. Dist Cx lesion is 60% stenosed. Mid RCA lesion is 95% stenosed. 3rd Mrg-1 lesion is 80% stenosed. 3rd Mrg-2 lesion is 99% stenosed. 1st LPL lesion is 99% stenosed.   1. Severe multi-vessel CAD 2. Moderately severe mid LAD stenosis 3. Severe restenosis of the stented segment in the proximal segment of the ramus intermediate branch 4. Severe restenosis  mid Circumflex/obtuse marginal stented segment. Severe stenosis in the first obtuse marginal branch beyond the stented segment. Severe stenosis distal Circumflex leading into the moderate caliber second obtuse marginal branch.  5. Severe stenosis mid RCA   Had coronary artery bypass grafting-LIMA to LAD, SVG to RI  - OM3, SVT to D1 The distal right coronary artery/posterior descending branch was evaluated but was considered to be too small for bypass grafting. Is healing   He has had some recurrent pleural effusions and had a thoracentesis 2 days ago.   1.4 liters   The procedure was done in special procedures/interventional radiology on November 4.  He had some postoperative atrial fibrillation and is now on amiodarone  He was previously on amlodipine 2.5 mg a day, hydralazine 25 mg 3 times a day,  isosorbide 30 mg a day and Eliquis.  These medications have been discontinued.  His simvastatin was discontinued but now he is on Crestor 20 mg a day.  January 25, 2019:  Bradley Soto is seen today for follow-up visit.  He has a history of coronary artery disease and is status post coronary artery bypass grafting ( Oct. 19,. 2020 )   He has a pacemaker. Breathing is going ok Slight DOE ,  No fever,    No angina    Feb. 17, 2023 Bradley Soto is seen today for follow up of his CAD, CABG Is on HD now for the past 1.5 years  occasional has hypotension with HD  Is developing peripheral neuropathy in his feet LDL is 16   Current Outpatient Medications  Medication Sig Dispense Refill   allopurinol (ZYLOPRIM) 100 MG tablet Take 1 tablet (100 mg total) by mouth daily. 30 tablet 3   aspirin EC 81 MG tablet Take 81 mg by mouth daily.     B Complex-C-Zn-Folic Acid (DIALYVITE 366 WITH ZINC) 0.8 MG TABS Take 1 tablet by mouth at bedtime.     gabapentin (NEURONTIN) 100 MG capsule Take 100 mg by mouth daily.     Methoxy PEG-Epoetin Beta (MIRCERA IJ) Mircera     NOVOLOG MIX 70/30 FLEXPEN (70-30) 100 UNIT/ML FlexPen Inject 35 Units into the skin daily.   3   rosuvastatin (CRESTOR) 5 MG tablet Take 5 mg by mouth.     sevelamer carbonate (RENVELA) 800 MG tablet Take 800 mg by mouth daily.     Cholecalciferol (VITAMIN D-3) 25 MCG (1000 UT) CAPS Take 3,000 Units by mouth daily.  (Patient not taking: Reported on 04/04/2021)     diphenhydramine-acetaminophen (TYLENOL PM) 25-500 MG TABS tablet Take 0.5 tablets by mouth at bedtime as needed (sleep).  (Patient not taking: Reported on 04/04/2021)     hydrALAZINE (APRESOLINE) 25 MG tablet Take 1 tablet (25 mg total) by mouth 3 (three) times daily. (Patient not taking: Reported on 04/04/2021) 90 tablet 3   HYDROcodone-acetaminophen (NORCO) 5-325 MG tablet Take 1 tablet by mouth every 6 (six) hours as needed for moderate pain. (Patient not taking: Reported on 04/04/2021) 10 tablet 0    ipratropium (ATROVENT) 0.03 % nasal spray Place 2 sprays into both nostrils every 12 (twelve) hours. (Patient not taking: Reported on 04/04/2021) 30 mL 1   metoprolol tartrate (LOPRESSOR) 25 MG tablet TAKE 1 AND 1/2 TABLETS BY MOUTH TWICE A DAY NEEDS APPT FOR REFILLS (Patient not taking: Reported on 04/04/2021) 90 tablet 0   ondansetron (ZOFRAN) 4 MG tablet Take 1 tablet (4 mg total) by mouth every 6 (six) hours as needed for nausea. (Patient not taking:  Reported on 04/04/2021) 30 tablet 1   rosuvastatin (CRESTOR) 20 MG tablet Take 1 tablet (20 mg total) by mouth daily at 6 PM. Please make overdue appt with Dr. [name] before anymore refills. Thank you 2nd attempt (Patient not taking: Reported on 04/04/2021) 15 tablet 0   sevelamer carbonate (RENVELA) 800 MG tablet Take 1 tablet (800 mg total) by mouth 3 (three) times daily with meals. (Patient not taking: Reported on 04/04/2021) 90 tablet 3   No current facility-administered medications for this visit.     Allergies  Allergen Reactions   Other Other (See Comments)   Shellfish Allergy Rash    Past Medical History:  Diagnosis Date   Anxiety    Bell's palsy    left side of face droops   CAD (coronary artery disease)    s/p stent placement   CKD (chronic kidney disease), stage III (HCC)    [place]- [facility]- [facility]   Colon polyps    Diabetes mellitus without complication (HCC)    Gout    History of blood transfusion    History of kidney stones    Passes   HLD (hyperlipidemia)    HTN (hypertension)    Insomnia    MI (myocardial infarction) ([place]) 1998   NSTEMI (non-ST elevated myocardial infarction) ([place])    s/p PCI in early 2000s   OSA on CPAP    wears CPAP   Presence of permanent cardiac pacemaker    SSS (sick sinus syndrome) (HCC)    Type II or unspecified type diabetes mellitus without mention of complication, not stated as uncontrolled    Wears glasses    reading    Wears partial dentures     Past Surgical History:   Procedure Laterality Date   APPENDECTOMY     AV FISTULA PLACEMENT Right 07/28/2019   Procedure: RIGHT ARM Brachiocephalic ARTERIOVENOUS (AV) FISTULA CREATION;  Surgeon: Serafina Mitchell, MD;  Location: [facility];  Service: Vascular;  Laterality: Right;   CATARACT EXTRACTION     CLIPPING OF ATRIAL APPENDAGE  12/05/2018   Procedure: Clipping Of Atrial Appendage;  Surgeon: Wonda Olds, MD;  Location: [facility] OR;  Service: Open Heart Surgery;;   CORONARY ANGIOPLASTY WITH STENT PLACEMENT     x4   CORONARY ARTERY BYPASS GRAFT N/A 12/05/2018   Procedure: CORONARY ARTERY BYPASS GRAFTING (CABG) X FOUR USING LEFT INTERNAL MAMMARY ARTERY AND RIGHT SAPHENOUS VEIN GRAFTS;  Surgeon: Wonda Olds, MD;  Location: [facility];  Service: Open Heart Surgery;  Laterality: N/A;   HERNIA REPAIR     ventral hernia repair with mesh--revision also was required   INSERT / REPLACE / REMOVE PACEMAKER  05/10/2000   pacemaker implanted in [place]]   IR THORACENTESIS ASP PLEURAL SPACE W/IMG GUIDE  12/21/2018   IR THORACENTESIS ASP PLEURAL SPACE W/IMG GUIDE  12/30/2018   LEFT HEART CATH AND CORONARY ANGIOGRAPHY N/A 11/30/2018   Procedure: LEFT HEART CATH AND CORONARY ANGIOGRAPHY;  Surgeon: Burnell Blanks, MD;  Location: [place] [facility];  Service: Cardiovascular;  Laterality: N/A;   MAZE N/A 12/05/2018   Procedure: MAZE;  Surgeon: Wonda Olds, MD;  Location: [facility];  Service: Open Heart Surgery;  Laterality: N/A;   PACEMAKER GENERATOR CHANGE  09/03/09   MDT Adapta generator change in [place]  2/2   polyp that could not be removed on colonoscopy   PPM GENERATOR CHANGEOUT N/A 06/01/2019   Procedure: [place];  Surgeon: [name], MD;  Location: [facility];  Service: Cardiovascular;  Laterality: N/A;   REVISON OF ARTERIOVENOUS FISTULA Right 01/24/2020   Procedure: BANDING OF RIGHT ARM ARTERIOVENOUS FISTULA;  Surgeon: Serafina Mitchell, MD;  Location: [facility];   Service: Vascular;  Laterality: Right;   TEE WITHOUT CARDIOVERSION N/A 12/05/2018   Procedure: TRANSESOPHAGEAL ECHOCARDIOGRAM (TEE);  Surgeon: Wonda Olds, MD;  Location: [facility];  Service: Open Heart Surgery;  Laterality: N/A;    Social History   Tobacco Use  Smoking Status Former   Types: Cigarettes  Smokeless Tobacco Never  Tobacco Comments   " many years ago"    Social History   Substance and Sexual Activity  Alcohol Use No    Family History  Problem Relation Age of Onset   Heart attack Father 56   Heart disease Father    Breast cancer Sister    Pulmonary embolism Sister    Deep vein thrombosis Sister     Reviw of Systems:  Reviewed in the HPI.  All other systems are negative.  Physical Exam: Blood pressure 122/60, pulse 94, height 5\' 11"  (1.803 m), weight 220 lb 12.8 oz (100.2 kg), SpO2 95 %.  GEN:  Well nourished, well developed in no acute distress HEENT: Normal NECK: No JVD; No carotid bruits LYMPHATICS: No lymphadenopathy CARDIAC: RRR ,  soft systolic murmur .   RESPIRATORY:  Clear to auscultation without rales, wheezing or rhonchi  ABDOMEN: Soft, non-tender, non-distended MUSCULOSKELETAL:  No edema; No deformity  SKIN: Warm and dry NEUROLOGIC:  Alert and oriented x 3   ECG:  Apr 04, 2021 normal sinus rhythm at 94.  Right bundle branch block.  No changes from previous EKG.  Assessment / Plan:   1.  Coronary artery disease: Had a heart catheterization which revealed severe three-vessel coronary artery disease.  He had coronary artery bypass grafting to his LAD, circumflex artery, diagonal vessel.  The right coronary artery was too small to consider bypassing.     2.  Chronic diastolic congestive heart failure: :  seems to be stable      3.  Hypertension:   bp is stable,  has occasional episodes of hypotension with HD.   I've advised him that his nephrologist should be managing his BP mediscaiont     4.  Chronic renal insufficiency:  Followed by nephrology.  5.  Paroxysmal atrial fibrillation:   remains in NS[facility] today        Mertie Moores, MD  04/04/2021 10:04 AM    [facility] [place],  [place], [place]  [address] Pager [phone] Phone: [phone]; Fax: [phone]) [phone]   .

## 2021-04-04 ENCOUNTER — Ambulatory Visit (INDEPENDENT_AMBULATORY_CARE_PROVIDER_SITE_OTHER): Payer: Medicare Other | Admitting: Cardiovascular Disease

## 2021-04-04 ENCOUNTER — Encounter: Payer: Self-pay | Admitting: Cardiovascular Disease

## 2021-04-04 ENCOUNTER — Other Ambulatory Visit: Payer: Self-pay

## 2021-04-04 VITALS — BP 122/60 | HR 94 | Ht 71.0 in | Wt 220.8 lb

## 2021-04-04 DIAGNOSIS — I2510 Atherosclerotic heart disease of native coronary artery without angina pectoris: Secondary | ICD-10-CM

## 2021-04-04 DIAGNOSIS — E782 Mixed hyperlipidemia: Secondary | ICD-10-CM | POA: Diagnosis not present

## 2021-04-04 NOTE — Patient Instructions (Signed)
Medication Instructions:  Your physician recommends that you continue on your current medications as directed. Please refer to the Current Medication list given to you today.  *If you need a refill on your cardiac medications before your next appointment, please call your pharmacy*   Lab Work: NONE If you have labs (blood work) drawn today and your tests are completely normal, you will receive your results only by: [place] (if you have MyChart) OR A paper copy in the mail If you have any lab test that is abnormal or we need to change your treatment, we will call you to review the results.   Testing/Procedures: NONE   Follow-Up: At [facility], you and your health needs are our priority.  As part of our continuing mission to provide you with exceptional heart care, we have created designated Provider Care Teams.  These Care Teams include your primary Cardiologist (physician) and Advanced Practice Providers (APPs -  Physician Assistants and Nurse Practitioners) who all work together to provide you with the care you need, when you need it.  We recommend signing up for the patient portal called "MyChart".  Sign up information is provided on this After Visit Summary.  MyChart is used to connect with patients for Virtual Visits (Telemedicine).  Patients are able to view lab/test results, encounter notes, upcoming appointments, etc.  Non-urgent messages can be sent to your provider as well.   To learn more about what you can do with MyChart, go to [email or web address].    Your next appointment:   1 year(s)  The format for your next appointment:   In Person  Provider:   Mertie Moores, MD, or PA or NP

## 2021-04-05 DIAGNOSIS — D631 Anemia in chronic kidney disease: Secondary | ICD-10-CM | POA: Diagnosis not present

## 2021-04-05 DIAGNOSIS — N186 End stage renal disease: Secondary | ICD-10-CM | POA: Diagnosis not present

## 2021-04-05 DIAGNOSIS — E1122 Type 2 diabetes mellitus with diabetic chronic kidney disease: Secondary | ICD-10-CM | POA: Diagnosis not present

## 2021-04-05 DIAGNOSIS — Z992 Dependence on renal dialysis: Secondary | ICD-10-CM | POA: Diagnosis not present

## 2021-04-05 DIAGNOSIS — D509 Iron deficiency anemia, unspecified: Secondary | ICD-10-CM | POA: Diagnosis not present

## 2021-04-05 DIAGNOSIS — N2581 Secondary hyperparathyroidism of renal origin: Secondary | ICD-10-CM | POA: Diagnosis not present

## 2021-04-08 DIAGNOSIS — D631 Anemia in chronic kidney disease: Secondary | ICD-10-CM | POA: Diagnosis not present

## 2021-04-08 DIAGNOSIS — E1122 Type 2 diabetes mellitus with diabetic chronic kidney disease: Secondary | ICD-10-CM | POA: Diagnosis not present

## 2021-04-08 DIAGNOSIS — N2581 Secondary hyperparathyroidism of renal origin: Secondary | ICD-10-CM | POA: Diagnosis not present

## 2021-04-08 DIAGNOSIS — D509 Iron deficiency anemia, unspecified: Secondary | ICD-10-CM | POA: Diagnosis not present

## 2021-04-08 DIAGNOSIS — Z992 Dependence on renal dialysis: Secondary | ICD-10-CM | POA: Diagnosis not present

## 2021-04-08 DIAGNOSIS — N186 End stage renal disease: Secondary | ICD-10-CM | POA: Diagnosis not present

## 2021-04-10 DIAGNOSIS — Z992 Dependence on renal dialysis: Secondary | ICD-10-CM | POA: Diagnosis not present

## 2021-04-10 DIAGNOSIS — E1122 Type 2 diabetes mellitus with diabetic chronic kidney disease: Secondary | ICD-10-CM | POA: Diagnosis not present

## 2021-04-10 DIAGNOSIS — D509 Iron deficiency anemia, unspecified: Secondary | ICD-10-CM | POA: Diagnosis not present

## 2021-04-10 DIAGNOSIS — N2581 Secondary hyperparathyroidism of renal origin: Secondary | ICD-10-CM | POA: Diagnosis not present

## 2021-04-10 DIAGNOSIS — D631 Anemia in chronic kidney disease: Secondary | ICD-10-CM | POA: Diagnosis not present

## 2021-04-10 DIAGNOSIS — N186 End stage renal disease: Secondary | ICD-10-CM | POA: Diagnosis not present

## 2021-04-12 DIAGNOSIS — N186 End stage renal disease: Secondary | ICD-10-CM | POA: Diagnosis not present

## 2021-04-12 DIAGNOSIS — D509 Iron deficiency anemia, unspecified: Secondary | ICD-10-CM | POA: Diagnosis not present

## 2021-04-12 DIAGNOSIS — N2581 Secondary hyperparathyroidism of renal origin: Secondary | ICD-10-CM | POA: Diagnosis not present

## 2021-04-12 DIAGNOSIS — Z992 Dependence on renal dialysis: Secondary | ICD-10-CM | POA: Diagnosis not present

## 2021-04-12 DIAGNOSIS — D631 Anemia in chronic kidney disease: Secondary | ICD-10-CM | POA: Diagnosis not present

## 2021-04-12 DIAGNOSIS — E1122 Type 2 diabetes mellitus with diabetic chronic kidney disease: Secondary | ICD-10-CM | POA: Diagnosis not present

## 2021-04-15 DIAGNOSIS — N186 End stage renal disease: Secondary | ICD-10-CM | POA: Diagnosis not present

## 2021-04-15 DIAGNOSIS — D631 Anemia in chronic kidney disease: Secondary | ICD-10-CM | POA: Diagnosis not present

## 2021-04-15 DIAGNOSIS — E1122 Type 2 diabetes mellitus with diabetic chronic kidney disease: Secondary | ICD-10-CM | POA: Diagnosis not present

## 2021-04-15 DIAGNOSIS — Z992 Dependence on renal dialysis: Secondary | ICD-10-CM | POA: Diagnosis not present

## 2021-04-15 DIAGNOSIS — D509 Iron deficiency anemia, unspecified: Secondary | ICD-10-CM | POA: Diagnosis not present

## 2021-04-15 DIAGNOSIS — N2581 Secondary hyperparathyroidism of renal origin: Secondary | ICD-10-CM | POA: Diagnosis not present

## 2021-04-16 DIAGNOSIS — I129 Hypertensive chronic kidney disease with stage 1 through stage 4 chronic kidney disease, or unspecified chronic kidney disease: Secondary | ICD-10-CM | POA: Diagnosis not present

## 2021-04-16 DIAGNOSIS — Z992 Dependence on renal dialysis: Secondary | ICD-10-CM | POA: Diagnosis not present

## 2021-04-16 DIAGNOSIS — N186 End stage renal disease: Secondary | ICD-10-CM | POA: Diagnosis not present

## 2021-04-16 IMAGING — DX DG CHEST 1V PORT
1 series · 1 of 1 positions shown · non-contrast
Comparison: January 06, 2019

CLINICAL DATA: Cough and fever

EXAM:
PORTABLE CHEST 1 VIEW

[chest ap]
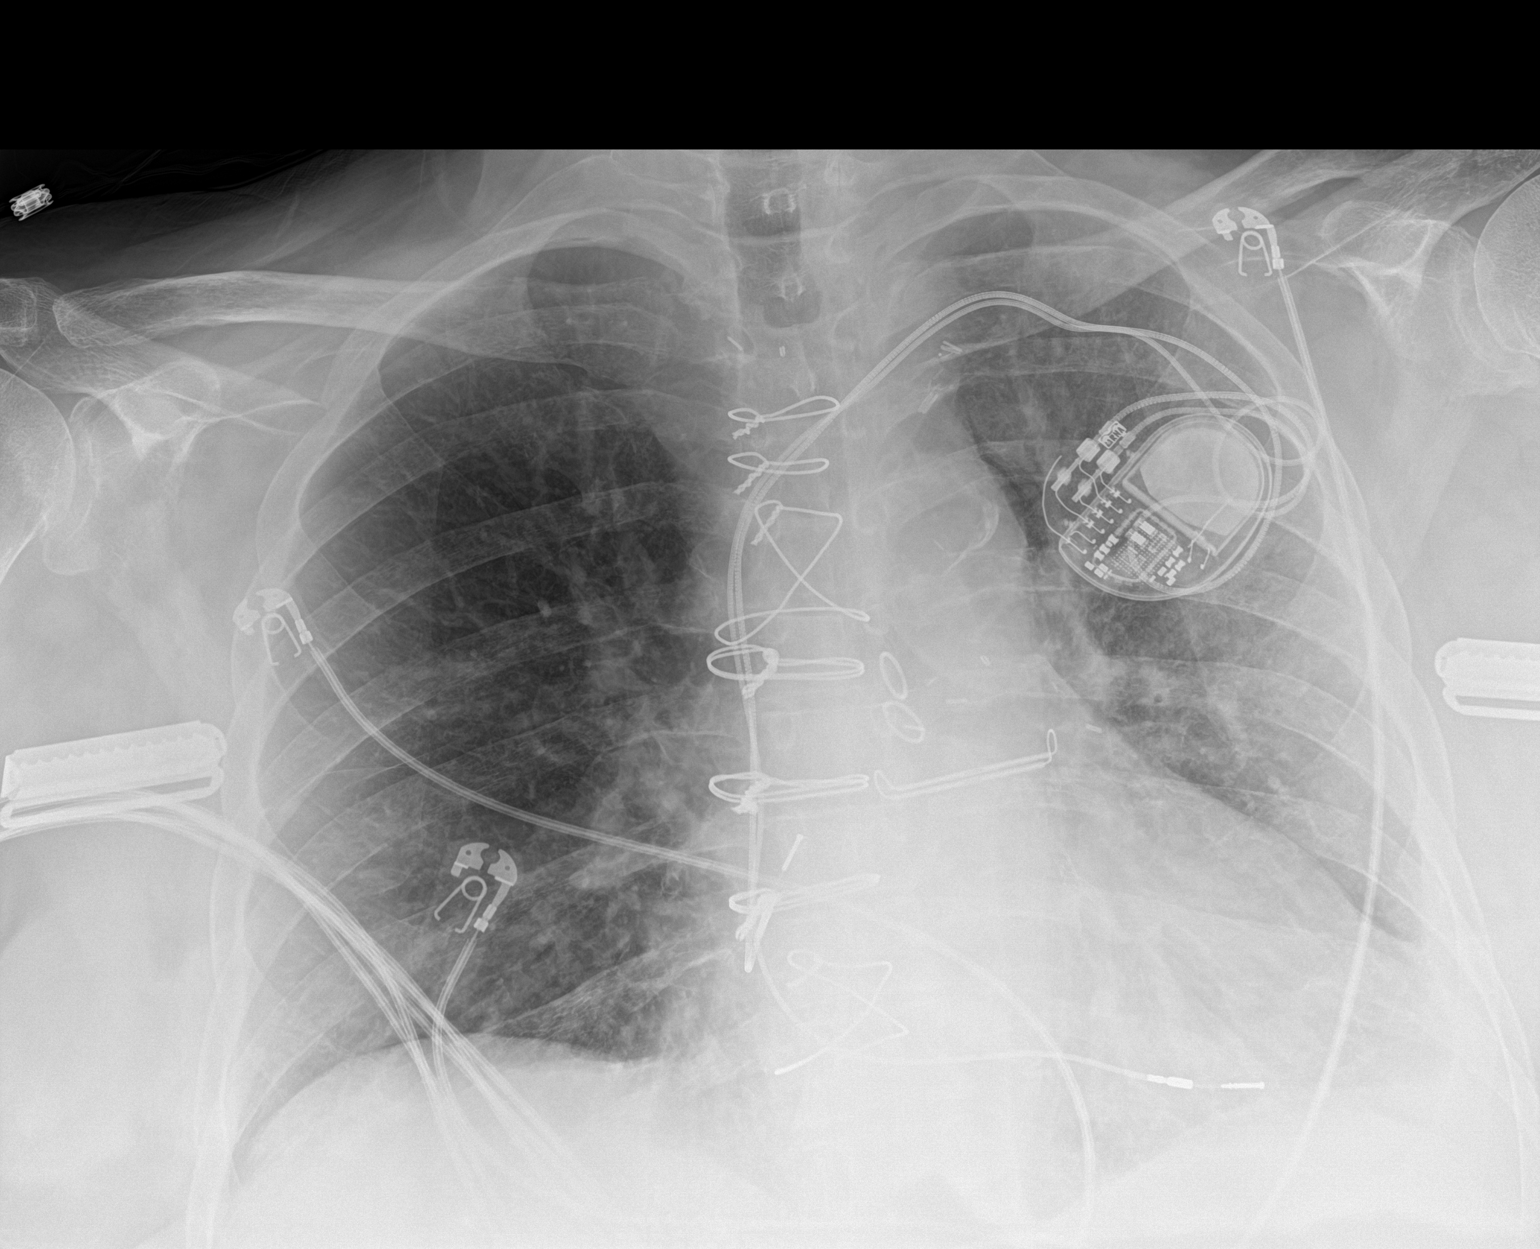

[1 of 1 positions shown; findings below may reference images not displayed]

FINDINGS: The heart size and mediastinal contours are within normal limits.
Hazy patchy airspace opacity is seen at the left lung base. The
right lung is clear. A left-sided pacemaker seen with the lead tips
in the right atrium right ventricle. No acute osseous abnormality.
IMPRESSION: Hazy airspace opacity at the left lung base which could be due to
atelectasis and/or infectious etiology.

## 2021-04-17 DIAGNOSIS — N2581 Secondary hyperparathyroidism of renal origin: Secondary | ICD-10-CM | POA: Diagnosis not present

## 2021-04-17 DIAGNOSIS — D509 Iron deficiency anemia, unspecified: Secondary | ICD-10-CM | POA: Diagnosis not present

## 2021-04-17 DIAGNOSIS — Z992 Dependence on renal dialysis: Secondary | ICD-10-CM | POA: Diagnosis not present

## 2021-04-17 DIAGNOSIS — E1122 Type 2 diabetes mellitus with diabetic chronic kidney disease: Secondary | ICD-10-CM | POA: Diagnosis not present

## 2021-04-17 DIAGNOSIS — N186 End stage renal disease: Secondary | ICD-10-CM | POA: Diagnosis not present

## 2021-04-17 DIAGNOSIS — D631 Anemia in chronic kidney disease: Secondary | ICD-10-CM | POA: Diagnosis not present

## 2021-04-19 DIAGNOSIS — N186 End stage renal disease: Secondary | ICD-10-CM | POA: Diagnosis not present

## 2021-04-19 DIAGNOSIS — Z992 Dependence on renal dialysis: Secondary | ICD-10-CM | POA: Diagnosis not present

## 2021-04-19 DIAGNOSIS — E1122 Type 2 diabetes mellitus with diabetic chronic kidney disease: Secondary | ICD-10-CM | POA: Diagnosis not present

## 2021-04-19 DIAGNOSIS — N2581 Secondary hyperparathyroidism of renal origin: Secondary | ICD-10-CM | POA: Diagnosis not present

## 2021-04-19 DIAGNOSIS — D509 Iron deficiency anemia, unspecified: Secondary | ICD-10-CM | POA: Diagnosis not present

## 2021-04-19 DIAGNOSIS — D631 Anemia in chronic kidney disease: Secondary | ICD-10-CM | POA: Diagnosis not present

## 2021-04-22 DIAGNOSIS — N186 End stage renal disease: Secondary | ICD-10-CM | POA: Diagnosis not present

## 2021-04-22 DIAGNOSIS — N2581 Secondary hyperparathyroidism of renal origin: Secondary | ICD-10-CM | POA: Diagnosis not present

## 2021-04-22 DIAGNOSIS — D631 Anemia in chronic kidney disease: Secondary | ICD-10-CM | POA: Diagnosis not present

## 2021-04-22 DIAGNOSIS — Z992 Dependence on renal dialysis: Secondary | ICD-10-CM | POA: Diagnosis not present

## 2021-04-22 DIAGNOSIS — E1122 Type 2 diabetes mellitus with diabetic chronic kidney disease: Secondary | ICD-10-CM | POA: Diagnosis not present

## 2021-04-22 DIAGNOSIS — D509 Iron deficiency anemia, unspecified: Secondary | ICD-10-CM | POA: Diagnosis not present

## 2021-04-24 DIAGNOSIS — D631 Anemia in chronic kidney disease: Secondary | ICD-10-CM | POA: Diagnosis not present

## 2021-04-24 DIAGNOSIS — N186 End stage renal disease: Secondary | ICD-10-CM | POA: Diagnosis not present

## 2021-04-24 DIAGNOSIS — N2581 Secondary hyperparathyroidism of renal origin: Secondary | ICD-10-CM | POA: Diagnosis not present

## 2021-04-24 DIAGNOSIS — D509 Iron deficiency anemia, unspecified: Secondary | ICD-10-CM | POA: Diagnosis not present

## 2021-04-24 DIAGNOSIS — Z992 Dependence on renal dialysis: Secondary | ICD-10-CM | POA: Diagnosis not present

## 2021-04-24 DIAGNOSIS — E1122 Type 2 diabetes mellitus with diabetic chronic kidney disease: Secondary | ICD-10-CM | POA: Diagnosis not present

## 2021-04-26 DIAGNOSIS — N2581 Secondary hyperparathyroidism of renal origin: Secondary | ICD-10-CM | POA: Diagnosis not present

## 2021-04-26 DIAGNOSIS — E1122 Type 2 diabetes mellitus with diabetic chronic kidney disease: Secondary | ICD-10-CM | POA: Diagnosis not present

## 2021-04-26 DIAGNOSIS — D509 Iron deficiency anemia, unspecified: Secondary | ICD-10-CM | POA: Diagnosis not present

## 2021-04-26 DIAGNOSIS — Z992 Dependence on renal dialysis: Secondary | ICD-10-CM | POA: Diagnosis not present

## 2021-04-26 DIAGNOSIS — D631 Anemia in chronic kidney disease: Secondary | ICD-10-CM | POA: Diagnosis not present

## 2021-04-26 DIAGNOSIS — N186 End stage renal disease: Secondary | ICD-10-CM | POA: Diagnosis not present

## 2021-04-29 DIAGNOSIS — D631 Anemia in chronic kidney disease: Secondary | ICD-10-CM | POA: Diagnosis not present

## 2021-04-29 DIAGNOSIS — D509 Iron deficiency anemia, unspecified: Secondary | ICD-10-CM | POA: Diagnosis not present

## 2021-04-29 DIAGNOSIS — N2581 Secondary hyperparathyroidism of renal origin: Secondary | ICD-10-CM | POA: Diagnosis not present

## 2021-04-29 DIAGNOSIS — N186 End stage renal disease: Secondary | ICD-10-CM | POA: Diagnosis not present

## 2021-04-29 DIAGNOSIS — Z992 Dependence on renal dialysis: Secondary | ICD-10-CM | POA: Diagnosis not present

## 2021-04-29 DIAGNOSIS — E1122 Type 2 diabetes mellitus with diabetic chronic kidney disease: Secondary | ICD-10-CM | POA: Diagnosis not present

## 2021-04-30 NOTE — Patient Outreach (Signed)
[facility]) Care Management ? ?04/30/2021 ? ?[name] ?[birth year] ?[id number] ? ? ?Received referral for Care Management from Insurance plan. Assigned patient to Raina Mina, RN Care Coordinator for follow. ? ? ?Bradley Soto ?[place] Management Assistant ?[phone] ? ?

## 2021-05-01 DIAGNOSIS — D509 Iron deficiency anemia, unspecified: Secondary | ICD-10-CM | POA: Diagnosis not present

## 2021-05-01 DIAGNOSIS — N186 End stage renal disease: Secondary | ICD-10-CM | POA: Diagnosis not present

## 2021-05-01 DIAGNOSIS — E1122 Type 2 diabetes mellitus with diabetic chronic kidney disease: Secondary | ICD-10-CM | POA: Diagnosis not present

## 2021-05-01 DIAGNOSIS — D631 Anemia in chronic kidney disease: Secondary | ICD-10-CM | POA: Diagnosis not present

## 2021-05-01 DIAGNOSIS — Z992 Dependence on renal dialysis: Secondary | ICD-10-CM | POA: Diagnosis not present

## 2021-05-01 DIAGNOSIS — N2581 Secondary hyperparathyroidism of renal origin: Secondary | ICD-10-CM | POA: Diagnosis not present

## 2021-05-03 DIAGNOSIS — Z992 Dependence on renal dialysis: Secondary | ICD-10-CM | POA: Diagnosis not present

## 2021-05-03 DIAGNOSIS — D509 Iron deficiency anemia, unspecified: Secondary | ICD-10-CM | POA: Diagnosis not present

## 2021-05-03 DIAGNOSIS — N2581 Secondary hyperparathyroidism of renal origin: Secondary | ICD-10-CM | POA: Diagnosis not present

## 2021-05-03 DIAGNOSIS — D631 Anemia in chronic kidney disease: Secondary | ICD-10-CM | POA: Diagnosis not present

## 2021-05-03 DIAGNOSIS — E1122 Type 2 diabetes mellitus with diabetic chronic kidney disease: Secondary | ICD-10-CM | POA: Diagnosis not present

## 2021-05-03 DIAGNOSIS — N186 End stage renal disease: Secondary | ICD-10-CM | POA: Diagnosis not present

## 2021-05-06 DIAGNOSIS — D631 Anemia in chronic kidney disease: Secondary | ICD-10-CM | POA: Diagnosis not present

## 2021-05-06 DIAGNOSIS — Z992 Dependence on renal dialysis: Secondary | ICD-10-CM | POA: Diagnosis not present

## 2021-05-06 DIAGNOSIS — E1122 Type 2 diabetes mellitus with diabetic chronic kidney disease: Secondary | ICD-10-CM | POA: Diagnosis not present

## 2021-05-06 DIAGNOSIS — N2581 Secondary hyperparathyroidism of renal origin: Secondary | ICD-10-CM | POA: Diagnosis not present

## 2021-05-06 DIAGNOSIS — D509 Iron deficiency anemia, unspecified: Secondary | ICD-10-CM | POA: Diagnosis not present

## 2021-05-06 DIAGNOSIS — N186 End stage renal disease: Secondary | ICD-10-CM | POA: Diagnosis not present

## 2021-05-08 DIAGNOSIS — D509 Iron deficiency anemia, unspecified: Secondary | ICD-10-CM | POA: Diagnosis not present

## 2021-05-08 DIAGNOSIS — D631 Anemia in chronic kidney disease: Secondary | ICD-10-CM | POA: Diagnosis not present

## 2021-05-08 DIAGNOSIS — N186 End stage renal disease: Secondary | ICD-10-CM | POA: Diagnosis not present

## 2021-05-08 DIAGNOSIS — E1122 Type 2 diabetes mellitus with diabetic chronic kidney disease: Secondary | ICD-10-CM | POA: Diagnosis not present

## 2021-05-08 DIAGNOSIS — Z992 Dependence on renal dialysis: Secondary | ICD-10-CM | POA: Diagnosis not present

## 2021-05-08 DIAGNOSIS — N2581 Secondary hyperparathyroidism of renal origin: Secondary | ICD-10-CM | POA: Diagnosis not present

## 2021-05-09 ENCOUNTER — Other Ambulatory Visit: Payer: Self-pay | Admitting: *Deleted

## 2021-05-09 NOTE — Patient Outreach (Signed)
?[facility]) Care Management ?Telephonic RN Care Manager Note ? ? ?05/09/2021 ?Name:  Bradley Soto MRN:  [id number] DOB:  10/07/1940 ? ?Summary: ?Enrolled into the HTN program for care management assistance in managing pt's ongoing care through his caregiver spouse Bradley Soto). No acute issues to address today. Pt attends dialysis three days weekly with some encounters of hypotension however resolved.  ? ?Recommendations/Changes made from today's visit: ? Education provided on HTN and THN will send home device for ongoing blood pressure monitoring. Will complete the initial assessment in a few weeks as requested via caregiver spouse unable to completed today. ? ?Subjective: ?Bradley Soto is an 81 y.o. year old male who is a primary patient of Bradley Hatchet, MD. The care management team was consulted for assistance with care management and/or care coordination needs.   ? ?Telephonic RN Care Manager completed Telephone Visit today. ? ?Objective:  ? ?Medications Reviewed Today   ? ? Reviewed by Orma Render, CMA (Certified Medical Assistant) on 04/04/21 at [phone]  Med List Status: <None>  ? ?Medication Order Taking? Sig Documenting Provider Last Dose Status Informant  ?allopurinol (ZYLOPRIM) 100 MG tablet [id number] Yes Take 1 tablet (100 mg total) by mouth daily. [name], MD Taking Active Spouse/Significant Other  ?aspirin EC 81 MG tablet [id number] Yes Take 81 mg by mouth daily. [provider] Taking Active Spouse/Significant Other  ?         ?Med Note [name], [name]   Fri Jan 19, 2020  4:48 PM)    ?B Complex-C-Zn-Folic Acid (DIALYVITE 073 WITH ZINC) 0.8 MG TABS [id number] Yes Take 1 tablet by mouth at bedtime. [provider] Taking Active Spouse/Significant Other  ?Cholecalciferol (VITAMIN D-3) 25 MCG (1000 UT) CAPS [id number] No Take 3,000 Units by mouth daily.   ?Patient not taking: Reported on 04/04/2021  ? [provider] Not Taking Active  Spouse/Significant Other  ?diphenhydramine-acetaminophen (TYLENOL PM) 25-500 MG TABS tablet [id number] No Take 0.5 tablets by mouth at bedtime as needed (sleep).   ?Patient not taking: Reported on 04/04/2021  ? [provider] Not Taking Active Spouse/Significant Other  ?gabapentin (NEURONTIN) 100 MG capsule [id number] Yes Take 100 mg by mouth daily. [provider] Taking Active   ?hydrALAZINE (APRESOLINE) 25 MG tablet [id number] No Take 1 tablet (25 mg total) by mouth 3 (three) times daily.  ?Patient not taking: Reported on 04/04/2021  ? [name], [name], MD Not Taking Active Spouse/Significant Other  ?HYDROcodone-acetaminophen (NORCO) 5-325 MG tablet [id number] No Take 1 tablet by mouth every 6 (six) hours as needed for moderate pain.  ?Patient not taking: Reported on 04/04/2021  ? [name], PA-C Not Taking Active   ?ipratropium (ATROVENT) 0.03 % nasal spray [id number] No Place 2 sprays into both nostrils every 12 (twelve) hours.  ?Patient not taking: Reported on 04/04/2021  ? [name], DO Not Taking Active   ?Methoxy PEG-Epoetin Beta (MIRCERA IJ) [id number] Yes Mircera [provider] Taking Active   ?metoprolol tartrate (LOPRESSOR) 25 MG tablet [id number] No TAKE 1 AND 1/2 TABLETS BY MOUTH TWICE A DAY NEEDS APPT FOR REFILLS  ?Patient not taking: Reported on 04/04/2021  ? [name], MD Not Taking Active   ?NOVOLOG MIX 70/30 FLEXPEN (70-30) 100 UNIT/ML FlexPen [id number] Yes Inject 35 Units into the skin daily.  [provider] Taking Active Spouse/Significant Other  ?ondansetron (ZOFRAN) 4 MG tablet [id number] No Take 1 tablet (4 mg total) by mouth every 6 (six) hours as needed for  nausea.  ?Patient not taking: Reported on 04/04/2021  ? [name], MD Not Taking Active   ?rosuvastatin (CRESTOR) 20 MG tablet [id number] No Take 1 tablet (20 mg total) by mouth daily at 6 PM. Please make overdue appt with Dr. [name] before anymore refills. Thank you 2nd  attempt  ?Patient not taking: Reported on 04/04/2021  ? Nahser, [name], MD Not Taking Active   ?rosuvastatin (CRESTOR) 5 MG tablet [id number] Yes Take 5 mg by mouth. [provider] Taking Active   ?sevelamer carbonate (RENVELA) 800 MG tablet [id number] No Take 1 tablet (800 mg total) by mouth 3 (three) times daily with meals.  ?Patient not taking: Reported on 04/04/2021  ? [name], MD Not Taking Active Spouse/Significant Other  ?sevelamer carbonate (RENVELA) 800 MG tablet [id number] Yes Take 800 mg by mouth daily. [provider] Taking Active   ?Med List Note [name], CPhT 05/26/19 1422): Cpap  ? ?  ?  ? ?  ? ? ? ?SDOH:  (Social Determinants of Health) assessments and interventions performed:  ? ? ? ?Care Plan ? ?Review of patient past medical history, allergies, medications, health status, including review of consultants reports, laboratory and other test data, was performed as part of comprehensive evaluation for care management services.  ? ?Care Plan : RN Case management Plan of care  ?Updates made by Tobi Bastos, RN since 05/09/2021 12:00 AM  ?  ? ?Problem: Knowledge deficit related to HTN and care coordintion needs   ?Priority: High  ?  ? ?Long-Range Goal: Development plan of care for management of HTN   ?Start Date: 05/09/2021  ?Expected End Date: 11/14/2021  ?Priority: High  ?Note:   ?Current Barriers:  ?Knowledge Deficits related to plan of care for management of HTN  ? ?RNCM Clinical Goal(s):  ?Patient will verbalize basic understanding of  HTN disease process and self health management plan as evidenced by self reporting and chart review ?take all medications exactly as prescribed and will call provider for medication related questions as evidenced by self report and chart review  through collaboration with RN Care manager, provider, and care team.  ? ?Interventions: ?Inter-disciplinary care team collaboration (see longitudinal plan of care) ?Evaluation of  current treatment plan related to  self management and patient's adherence to plan as established by provider ? ? ?Hypertension Interventions:  (Status:  New goal.) Long Term Goal ?Last practice recorded BP readings:  ?BP Readings from Last 3 Encounters:  ?04/04/21 122/60  ?11/27/20 (!) 118/56  ?06/24/20 (!) 141/73  ?Most recent eGFR/CrCl: No results found for: EGFR  No components found for: CRCL ? ?Evaluation of current treatment plan related to hypertension self management and patient's adherence to plan as established by provider ?Provided education to patient re: stroke prevention, s/s of heart attack and stroke ?Reviewed medications with patient and discussed importance of compliance ?Provided assistance with obtaining home blood pressure monitor via [facility] via mail order; ?Discussed plans with patient for ongoing care management follow up and provided patient with direct contact information for care management team ?Provided education on prescribed diet DASH and other low sodium tips for healthy eating habits ?Screening for signs and symptoms of depression related to chronic disease state  ? ?Patient Goals/Self-Care Activities: ?Take all medications as prescribed ?Attend all scheduled provider appointments ?Call pharmacy for medication refills 3-7 days in advance of running out of medications ?Attend church or other social activities ?Perform all self care activities independently  ?Perform IADL's (shopping, preparing  meals, housekeeping, managing finances) independently ?Call provider office for new concerns or questions  ?check blood pressure 3 times per week ?choose a place to take my blood pressure (home, clinic or office, retail store) ?write blood pressure results in a log or diary ?keep a blood pressure log ?take blood pressure log to all doctor appointments ?call doctor for signs and symptoms of high blood pressure ?keep all doctor appointments ?report new symptoms to your doctor ? ?Follow Up Plan:   Telephone follow up appointment with care management team member scheduled for:  April 2023 ?The patient has been provided with contact information for the care management team and has been advised to call with any health rel

## 2021-05-09 NOTE — Patient Instructions (Signed)
Visit Information ? ?Thank you for taking time to visit with me today. Please don't hesitate to contact me if I can be of assistance to you before our next scheduled telephone appointment. ? ?Following are the goals we discussed today:  ? ?Take all medications as prescribed ?Attend all scheduled provider appointments ?Call pharmacy for medication refills 3-7 days in advance of running out of medications ?Attend church or other social activities ?Perform all self care activities independently  ?Perform IADL's (shopping, preparing meals, housekeeping, managing finances) independently ?Call provider office for new concerns or questions  ?check blood pressure 3 times per week ?choose a place to take my blood pressure (home, clinic or office, retail store) ?write blood pressure results in a log or diary ?keep a blood pressure log ?take blood pressure log to all doctor appointments ?call doctor for signs and symptoms of high blood pressure ?keep all doctor appointments ?report new symptoms to your doctor ?  ?

## 2021-05-10 DIAGNOSIS — N186 End stage renal disease: Secondary | ICD-10-CM | POA: Diagnosis not present

## 2021-05-10 DIAGNOSIS — D509 Iron deficiency anemia, unspecified: Secondary | ICD-10-CM | POA: Diagnosis not present

## 2021-05-10 DIAGNOSIS — D631 Anemia in chronic kidney disease: Secondary | ICD-10-CM | POA: Diagnosis not present

## 2021-05-10 DIAGNOSIS — N2581 Secondary hyperparathyroidism of renal origin: Secondary | ICD-10-CM | POA: Diagnosis not present

## 2021-05-10 DIAGNOSIS — E1122 Type 2 diabetes mellitus with diabetic chronic kidney disease: Secondary | ICD-10-CM | POA: Diagnosis not present

## 2021-05-10 DIAGNOSIS — Z992 Dependence on renal dialysis: Secondary | ICD-10-CM | POA: Diagnosis not present

## 2021-05-13 DIAGNOSIS — D509 Iron deficiency anemia, unspecified: Secondary | ICD-10-CM | POA: Diagnosis not present

## 2021-05-13 DIAGNOSIS — Z992 Dependence on renal dialysis: Secondary | ICD-10-CM | POA: Diagnosis not present

## 2021-05-13 DIAGNOSIS — N186 End stage renal disease: Secondary | ICD-10-CM | POA: Diagnosis not present

## 2021-05-13 DIAGNOSIS — N2581 Secondary hyperparathyroidism of renal origin: Secondary | ICD-10-CM | POA: Diagnosis not present

## 2021-05-13 DIAGNOSIS — D631 Anemia in chronic kidney disease: Secondary | ICD-10-CM | POA: Diagnosis not present

## 2021-05-13 DIAGNOSIS — E1122 Type 2 diabetes mellitus with diabetic chronic kidney disease: Secondary | ICD-10-CM | POA: Diagnosis not present

## 2021-05-15 DIAGNOSIS — N186 End stage renal disease: Secondary | ICD-10-CM | POA: Diagnosis not present

## 2021-05-15 DIAGNOSIS — Z992 Dependence on renal dialysis: Secondary | ICD-10-CM | POA: Diagnosis not present

## 2021-05-15 DIAGNOSIS — N2581 Secondary hyperparathyroidism of renal origin: Secondary | ICD-10-CM | POA: Diagnosis not present

## 2021-05-15 DIAGNOSIS — E1122 Type 2 diabetes mellitus with diabetic chronic kidney disease: Secondary | ICD-10-CM | POA: Diagnosis not present

## 2021-05-15 DIAGNOSIS — D509 Iron deficiency anemia, unspecified: Secondary | ICD-10-CM | POA: Diagnosis not present

## 2021-05-15 DIAGNOSIS — D631 Anemia in chronic kidney disease: Secondary | ICD-10-CM | POA: Diagnosis not present

## 2021-05-16 DIAGNOSIS — I5032 Chronic diastolic (congestive) heart failure: Secondary | ICD-10-CM | POA: Diagnosis not present

## 2021-05-16 DIAGNOSIS — I10 Essential (primary) hypertension: Secondary | ICD-10-CM | POA: Diagnosis not present

## 2021-05-16 DIAGNOSIS — E785 Hyperlipidemia, unspecified: Secondary | ICD-10-CM | POA: Diagnosis not present

## 2021-05-16 DIAGNOSIS — I130 Hypertensive heart and chronic kidney disease with heart failure and stage 1 through stage 4 chronic kidney disease, or unspecified chronic kidney disease: Secondary | ICD-10-CM | POA: Diagnosis not present

## 2021-05-17 DIAGNOSIS — N186 End stage renal disease: Secondary | ICD-10-CM | POA: Diagnosis not present

## 2021-05-17 DIAGNOSIS — D631 Anemia in chronic kidney disease: Secondary | ICD-10-CM | POA: Diagnosis not present

## 2021-05-17 DIAGNOSIS — E1122 Type 2 diabetes mellitus with diabetic chronic kidney disease: Secondary | ICD-10-CM | POA: Diagnosis not present

## 2021-05-17 DIAGNOSIS — Z992 Dependence on renal dialysis: Secondary | ICD-10-CM | POA: Diagnosis not present

## 2021-05-17 DIAGNOSIS — N2581 Secondary hyperparathyroidism of renal origin: Secondary | ICD-10-CM | POA: Diagnosis not present

## 2021-05-17 DIAGNOSIS — I129 Hypertensive chronic kidney disease with stage 1 through stage 4 chronic kidney disease, or unspecified chronic kidney disease: Secondary | ICD-10-CM | POA: Diagnosis not present

## 2021-05-17 DIAGNOSIS — D509 Iron deficiency anemia, unspecified: Secondary | ICD-10-CM | POA: Diagnosis not present

## 2021-05-20 DIAGNOSIS — N186 End stage renal disease: Secondary | ICD-10-CM | POA: Diagnosis not present

## 2021-05-20 DIAGNOSIS — E1122 Type 2 diabetes mellitus with diabetic chronic kidney disease: Secondary | ICD-10-CM | POA: Diagnosis not present

## 2021-05-20 DIAGNOSIS — D509 Iron deficiency anemia, unspecified: Secondary | ICD-10-CM | POA: Diagnosis not present

## 2021-05-20 DIAGNOSIS — D631 Anemia in chronic kidney disease: Secondary | ICD-10-CM | POA: Diagnosis not present

## 2021-05-20 DIAGNOSIS — Z992 Dependence on renal dialysis: Secondary | ICD-10-CM | POA: Diagnosis not present

## 2021-05-20 DIAGNOSIS — N2581 Secondary hyperparathyroidism of renal origin: Secondary | ICD-10-CM | POA: Diagnosis not present

## 2021-05-22 DIAGNOSIS — N186 End stage renal disease: Secondary | ICD-10-CM | POA: Diagnosis not present

## 2021-05-22 DIAGNOSIS — E1122 Type 2 diabetes mellitus with diabetic chronic kidney disease: Secondary | ICD-10-CM | POA: Diagnosis not present

## 2021-05-22 DIAGNOSIS — D631 Anemia in chronic kidney disease: Secondary | ICD-10-CM | POA: Diagnosis not present

## 2021-05-22 DIAGNOSIS — N2581 Secondary hyperparathyroidism of renal origin: Secondary | ICD-10-CM | POA: Diagnosis not present

## 2021-05-22 DIAGNOSIS — D509 Iron deficiency anemia, unspecified: Secondary | ICD-10-CM | POA: Diagnosis not present

## 2021-05-22 DIAGNOSIS — Z992 Dependence on renal dialysis: Secondary | ICD-10-CM | POA: Diagnosis not present

## 2021-05-24 DIAGNOSIS — D509 Iron deficiency anemia, unspecified: Secondary | ICD-10-CM | POA: Diagnosis not present

## 2021-05-24 DIAGNOSIS — Z992 Dependence on renal dialysis: Secondary | ICD-10-CM | POA: Diagnosis not present

## 2021-05-24 DIAGNOSIS — N186 End stage renal disease: Secondary | ICD-10-CM | POA: Diagnosis not present

## 2021-05-24 DIAGNOSIS — D631 Anemia in chronic kidney disease: Secondary | ICD-10-CM | POA: Diagnosis not present

## 2021-05-24 DIAGNOSIS — E1122 Type 2 diabetes mellitus with diabetic chronic kidney disease: Secondary | ICD-10-CM | POA: Diagnosis not present

## 2021-05-24 DIAGNOSIS — N2581 Secondary hyperparathyroidism of renal origin: Secondary | ICD-10-CM | POA: Diagnosis not present

## 2021-05-27 ENCOUNTER — Other Ambulatory Visit: Payer: Self-pay | Admitting: *Deleted

## 2021-05-27 DIAGNOSIS — Z992 Dependence on renal dialysis: Secondary | ICD-10-CM | POA: Diagnosis not present

## 2021-05-27 DIAGNOSIS — E1122 Type 2 diabetes mellitus with diabetic chronic kidney disease: Secondary | ICD-10-CM | POA: Diagnosis not present

## 2021-05-27 DIAGNOSIS — D631 Anemia in chronic kidney disease: Secondary | ICD-10-CM | POA: Diagnosis not present

## 2021-05-27 DIAGNOSIS — N2581 Secondary hyperparathyroidism of renal origin: Secondary | ICD-10-CM | POA: Diagnosis not present

## 2021-05-27 DIAGNOSIS — N186 End stage renal disease: Secondary | ICD-10-CM | POA: Diagnosis not present

## 2021-05-27 DIAGNOSIS — D509 Iron deficiency anemia, unspecified: Secondary | ICD-10-CM | POA: Diagnosis not present

## 2021-05-27 NOTE — Patient Outreach (Signed)
?[facility]) Care Management ?Telephonic RN Care Manager Note ? ? ?05/27/2021 ?Name:  Bradley Soto MRN:  [id number] DOB:  10/25/1940 ? ?Summary: ?Declined and requested closure of [facility] services at this time. ? ?Recommendations/Changes made from today's visit: ?None noted ? ?Subjective: ?Bradley Soto is an 81 y.o. year old male who is a primary patient of Bradley Hatchet, MD. The care management team was consulted for assistance with care management and/or care coordination needs.   ? ?Telephonic RN Care Manager completed Telephone Visit today. ? ?Objective:  ? ?Medications Reviewed Today   ? ? Reviewed by Orma Render, CMA (Certified Medical Assistant) on 04/04/21 at [phone]  Med List Status: <None>  ? ?Medication Order Taking? Sig Documenting Provider Last Dose Status Informant  ?allopurinol (ZYLOPRIM) 100 MG tablet [id number] Yes Take 1 tablet (100 mg total) by mouth daily. Bradley Dada, MD Taking Active Spouse/Significant Other  ?aspirin EC 81 MG tablet [id number] Yes Take 81 mg by mouth daily. [provider] Taking Active Spouse/Significant Other  ?         ?Med Note Bradley Soto, Bradley Soto   Fri Jan 19, 2020  4:48 PM)    ?B Complex-C-Zn-Folic Acid (DIALYVITE 381 WITH ZINC) 0.8 MG TABS [id number] Yes Take 1 tablet by mouth at bedtime. [provider] Taking Active Spouse/Significant Other  ?Cholecalciferol (VITAMIN D-3) 25 MCG (1000 UT) CAPS [id number] No Take 3,000 Units by mouth daily.   ?Patient not taking: Reported on 04/04/2021  ? [provider] Not Taking Active Spouse/Significant Other  ?diphenhydramine-acetaminophen (TYLENOL PM) 25-500 MG TABS tablet [id number] No Take 0.5 tablets by mouth at bedtime as needed (sleep).   ?Patient not taking: Reported on 04/04/2021  ? [provider] Not Taking Active Spouse/Significant Other  ?gabapentin (NEURONTIN) 100 MG capsule [id number] Yes Take 100 mg by mouth daily. [provider] Taking Active    ?hydrALAZINE (APRESOLINE) 25 MG tablet [id number] No Take 1 tablet (25 mg total) by mouth 3 (three) times daily.  ?Patient not taking: Reported on 04/04/2021  ? [name], [name], MD Not Taking Active Spouse/Significant Other  ?HYDROcodone-acetaminophen (NORCO) 5-325 MG tablet [id number] No Take 1 tablet by mouth every 6 (six) hours as needed for moderate pain.  ?Patient not taking: Reported on 04/04/2021  ? Bradley Ligas, PA-C Not Taking Active   ?ipratropium (ATROVENT) 0.03 % nasal spray [id number] No Place 2 sprays into both nostrils every 12 (twelve) hours.  ?Patient not taking: Reported on 04/04/2021  ? Bradley Nash, DO Not Taking Active   ?Methoxy PEG-Epoetin Beta (MIRCERA IJ) [id number] Yes Mircera [provider] Taking Active   ?metoprolol tartrate (LOPRESSOR) 25 MG tablet [id number] No TAKE 1 AND 1/2 TABLETS BY MOUTH TWICE A DAY NEEDS APPT FOR REFILLS  ?Patient not taking: Reported on 04/04/2021  ? [name], MD Not Taking Active   ?NOVOLOG MIX 70/30 FLEXPEN (70-30) 100 UNIT/ML FlexPen [id number] Yes Inject 35 Units into the skin daily.  [provider] Taking Active Spouse/Significant Other  ?ondansetron (ZOFRAN) 4 MG tablet [id number] No Take 1 tablet (4 mg total) by mouth every 6 (six) hours as needed for nausea.  ?Patient not taking: Reported on 04/04/2021  ? Bradley Dada, MD Not Taking Active   ?rosuvastatin (CRESTOR) 20 MG tablet [id number] No Take 1 tablet (20 mg total) by mouth daily at 6 PM. Please make overdue appt with Dr. [name] before anymore refills. Thank you 2nd attempt  ?Patient not taking: Reported on 04/04/2021  ?  Nahser, [name], MD Not Taking Active   ?rosuvastatin (CRESTOR) 5 MG tablet [id number] Yes Take 5 mg by mouth. [provider] Taking Active   ?sevelamer carbonate (RENVELA) 800 MG tablet [id number] No Take 1 tablet (800 mg total) by mouth 3 (three) times daily with meals.  ?Patient not taking: Reported on 04/04/2021  ? Bradley Dada, MD Not Taking Active Spouse/Significant Other  ?sevelamer carbonate (RENVELA) 800 MG tablet [id number] Yes Take 800 mg by mouth daily. [provider] Taking Active   ?Med List Note Bradley Soto, CPhT 05/26/19 1422): Cpap  ? ?  ?  ? ?  ? ? ? ?SDOH:  (Social Determinants of Health) assessments and interventions performed:  ? ? ? ?Care Plan ? ?Review of patient past medical history, allergies, medications, health status, including review of consultants reports, laboratory and other test data, was performed as part of comprehensive evaluation for care management services.  ? ?Care Plan : RN Case management Plan of care  ?Updates made by Tobi Bastos, RN since 05/27/2021 12:00 AM  ?  ? ?Problem: Knowledge deficit related to HTN and care coordintion needs   ?Priority: High  ?  ? ?Long-Range Goal: Development plan of care for management of HTN Completed 05/27/2021  ?Start Date: 05/09/2021  ?Expected End Date: 11/14/2021  ?Priority: High  ?Note:   ?Current Barriers:  ?Knowledge Deficits related to plan of care for management of HTN  ? ?RNCM Clinical Goal(s):  ?Patient will verbalize basic understanding of  HTN disease process and self health management plan as evidenced by self reporting and chart review ?take all medications exactly as prescribed and will call provider for medication related questions as evidenced by self report and chart review  through collaboration with RN Care manager, provider, and care team.  ? ?Interventions: ?Inter-disciplinary care team collaboration (see longitudinal plan of care) ?Evaluation of current treatment plan related to  self management and patient's adherence to plan as established by provider ? ? ?Hypertension Interventions:  (Status:  New goal.) Long Term Goal ?Last practice recorded BP readings:  ?BP Readings from Last 3 Encounters:  ?04/04/21 122/60  ?11/27/20 (!) 118/56  ?06/24/20 (!) 141/73  ?Most recent eGFR/CrCl: No results found for: EGFR  No components found  for: CRCL ? ?Evaluation of current treatment plan related to hypertension self management and patient's adherence to plan as established by provider ?Provided education to patient re: stroke prevention, s/s of heart attack and stroke ?Reviewed medications with patient and discussed importance of compliance ?Provided assistance with obtaining home blood pressure monitor via [facility] via mail order; ?Discussed plans with patient for ongoing care management follow up and provided patient with direct contact information for care management team ?Provided education on prescribed diet DASH and other low sodium tips for healthy eating habits ?Screening for signs and symptoms of depression related to chronic disease state  ? ?Patient Goals/Self-Care Activities: ?Take all medications as prescribed ?Attend all scheduled provider appointments ?Call pharmacy for medication refills 3-7 days in advance of running out of medications ?Attend church or other social activities ?Perform all self care activities independently  ?Perform IADL's (shopping, preparing meals, housekeeping, managing finances) independently ?Call provider office for new concerns or questions  ?check blood pressure 3 times per week ?choose a place to take my blood pressure (home, clinic or office, retail store) ?write blood pressure results in a log or diary ?keep a blood pressure log ?take blood pressure log to all doctor appointments ?call doctor for  signs and symptoms of high blood pressure ?keep all doctor appointments ?report new symptoms to your doctor ? ?Follow Up Plan:  Telephone follow up appointment with care management team member scheduled for:  April 2023 ?The patient has been provided with contact information for the care management team and has been advised to call with any health related questions or concerns.   ? ?05/27/2021: Spouse Clarise Cruz) has requested case closure indicating pt will not participate.  ?  ? ? ?Raina Mina, RN ?Care Management  Coordinator ?[place] ?Main Office [phone]  ? ?

## 2021-05-29 DIAGNOSIS — N186 End stage renal disease: Secondary | ICD-10-CM | POA: Diagnosis not present

## 2021-05-29 DIAGNOSIS — N2581 Secondary hyperparathyroidism of renal origin: Secondary | ICD-10-CM | POA: Diagnosis not present

## 2021-05-29 DIAGNOSIS — E1122 Type 2 diabetes mellitus with diabetic chronic kidney disease: Secondary | ICD-10-CM | POA: Diagnosis not present

## 2021-05-29 DIAGNOSIS — Z992 Dependence on renal dialysis: Secondary | ICD-10-CM | POA: Diagnosis not present

## 2021-05-29 DIAGNOSIS — D509 Iron deficiency anemia, unspecified: Secondary | ICD-10-CM | POA: Diagnosis not present

## 2021-05-29 DIAGNOSIS — D631 Anemia in chronic kidney disease: Secondary | ICD-10-CM | POA: Diagnosis not present

## 2021-05-30 ENCOUNTER — Ambulatory Visit (INDEPENDENT_AMBULATORY_CARE_PROVIDER_SITE_OTHER): Payer: Medicare Other

## 2021-05-30 DIAGNOSIS — I495 Sick sinus syndrome: Secondary | ICD-10-CM

## 2021-05-30 LAB — CUP PACEART REMOTE DEVICE CHECK
Battery Remaining Longevity: 127 mo
Battery Voltage: 3.03 V
Brady Statistic AP VP Percent: 0 %
Brady Statistic AP VS Percent: 5.94 %
Brady Statistic AS VP Percent: 0.04 %
Brady Statistic AS VS Percent: 94.01 %
Brady Statistic RA Percent Paced: 6.3 %
Brady Statistic RV Percent Paced: 0.05 %
Date Time Interrogation Session: 20230413211413
Implantable Lead Implant Date: 20020325
Implantable Lead Implant Date: 20020325
Implantable Lead Location: 753859
Implantable Lead Location: 753860
Implantable Lead Model: 4092
Implantable Lead Model: 4524
Implantable Pulse Generator Implant Date: 20210415
Lead Channel Impedance Value: 247 Ohm
Lead Channel Impedance Value: 304 Ohm
Lead Channel Impedance Value: 456 Ohm
Lead Channel Impedance Value: 589 Ohm
Lead Channel Pacing Threshold Amplitude: 0.875 V
Lead Channel Pacing Threshold Amplitude: 2.5 V
Lead Channel Pacing Threshold Pulse Width: 0.4 ms
Lead Channel Pacing Threshold Pulse Width: 0.4 ms
Lead Channel Sensing Intrinsic Amplitude: 0.625 mV
Lead Channel Sensing Intrinsic Amplitude: 0.625 mV
Lead Channel Sensing Intrinsic Amplitude: 23.625 mV
Lead Channel Sensing Intrinsic Amplitude: 23.625 mV
Lead Channel Setting Pacing Amplitude: 2.5 V
Lead Channel Setting Pacing Amplitude: 3 V
Lead Channel Setting Pacing Pulse Width: 0.4 ms
Lead Channel Setting Sensing Sensitivity: 1.2 mV

## 2021-05-31 DIAGNOSIS — D509 Iron deficiency anemia, unspecified: Secondary | ICD-10-CM | POA: Diagnosis not present

## 2021-05-31 DIAGNOSIS — Z992 Dependence on renal dialysis: Secondary | ICD-10-CM | POA: Diagnosis not present

## 2021-05-31 DIAGNOSIS — D631 Anemia in chronic kidney disease: Secondary | ICD-10-CM | POA: Diagnosis not present

## 2021-05-31 DIAGNOSIS — E1122 Type 2 diabetes mellitus with diabetic chronic kidney disease: Secondary | ICD-10-CM | POA: Diagnosis not present

## 2021-05-31 DIAGNOSIS — N186 End stage renal disease: Secondary | ICD-10-CM | POA: Diagnosis not present

## 2021-05-31 DIAGNOSIS — N2581 Secondary hyperparathyroidism of renal origin: Secondary | ICD-10-CM | POA: Diagnosis not present

## 2021-06-03 DIAGNOSIS — N2581 Secondary hyperparathyroidism of renal origin: Secondary | ICD-10-CM | POA: Diagnosis not present

## 2021-06-03 DIAGNOSIS — Z992 Dependence on renal dialysis: Secondary | ICD-10-CM | POA: Diagnosis not present

## 2021-06-03 DIAGNOSIS — E1122 Type 2 diabetes mellitus with diabetic chronic kidney disease: Secondary | ICD-10-CM | POA: Diagnosis not present

## 2021-06-03 DIAGNOSIS — D631 Anemia in chronic kidney disease: Secondary | ICD-10-CM | POA: Diagnosis not present

## 2021-06-03 DIAGNOSIS — D509 Iron deficiency anemia, unspecified: Secondary | ICD-10-CM | POA: Diagnosis not present

## 2021-06-03 DIAGNOSIS — N186 End stage renal disease: Secondary | ICD-10-CM | POA: Diagnosis not present

## 2021-06-05 DIAGNOSIS — E039 Hypothyroidism, unspecified: Secondary | ICD-10-CM | POA: Diagnosis not present

## 2021-06-05 DIAGNOSIS — Z992 Dependence on renal dialysis: Secondary | ICD-10-CM | POA: Diagnosis not present

## 2021-06-05 DIAGNOSIS — D631 Anemia in chronic kidney disease: Secondary | ICD-10-CM | POA: Diagnosis not present

## 2021-06-05 DIAGNOSIS — N2581 Secondary hyperparathyroidism of renal origin: Secondary | ICD-10-CM | POA: Diagnosis not present

## 2021-06-05 DIAGNOSIS — E1122 Type 2 diabetes mellitus with diabetic chronic kidney disease: Secondary | ICD-10-CM | POA: Diagnosis not present

## 2021-06-05 DIAGNOSIS — N186 End stage renal disease: Secondary | ICD-10-CM | POA: Diagnosis not present

## 2021-06-05 DIAGNOSIS — D509 Iron deficiency anemia, unspecified: Secondary | ICD-10-CM | POA: Diagnosis not present

## 2021-06-07 DIAGNOSIS — N2581 Secondary hyperparathyroidism of renal origin: Secondary | ICD-10-CM | POA: Diagnosis not present

## 2021-06-07 DIAGNOSIS — D631 Anemia in chronic kidney disease: Secondary | ICD-10-CM | POA: Diagnosis not present

## 2021-06-07 DIAGNOSIS — N186 End stage renal disease: Secondary | ICD-10-CM | POA: Diagnosis not present

## 2021-06-07 DIAGNOSIS — E1122 Type 2 diabetes mellitus with diabetic chronic kidney disease: Secondary | ICD-10-CM | POA: Diagnosis not present

## 2021-06-07 DIAGNOSIS — D509 Iron deficiency anemia, unspecified: Secondary | ICD-10-CM | POA: Diagnosis not present

## 2021-06-07 DIAGNOSIS — Z992 Dependence on renal dialysis: Secondary | ICD-10-CM | POA: Diagnosis not present

## 2021-06-10 DIAGNOSIS — D509 Iron deficiency anemia, unspecified: Secondary | ICD-10-CM | POA: Diagnosis not present

## 2021-06-10 DIAGNOSIS — Z992 Dependence on renal dialysis: Secondary | ICD-10-CM | POA: Diagnosis not present

## 2021-06-10 DIAGNOSIS — D631 Anemia in chronic kidney disease: Secondary | ICD-10-CM | POA: Diagnosis not present

## 2021-06-10 DIAGNOSIS — E1122 Type 2 diabetes mellitus with diabetic chronic kidney disease: Secondary | ICD-10-CM | POA: Diagnosis not present

## 2021-06-10 DIAGNOSIS — N186 End stage renal disease: Secondary | ICD-10-CM | POA: Diagnosis not present

## 2021-06-10 DIAGNOSIS — N2581 Secondary hyperparathyroidism of renal origin: Secondary | ICD-10-CM | POA: Diagnosis not present

## 2021-06-12 DIAGNOSIS — N186 End stage renal disease: Secondary | ICD-10-CM | POA: Diagnosis not present

## 2021-06-12 DIAGNOSIS — D509 Iron deficiency anemia, unspecified: Secondary | ICD-10-CM | POA: Diagnosis not present

## 2021-06-12 DIAGNOSIS — D631 Anemia in chronic kidney disease: Secondary | ICD-10-CM | POA: Diagnosis not present

## 2021-06-12 DIAGNOSIS — E1122 Type 2 diabetes mellitus with diabetic chronic kidney disease: Secondary | ICD-10-CM | POA: Diagnosis not present

## 2021-06-12 DIAGNOSIS — N2581 Secondary hyperparathyroidism of renal origin: Secondary | ICD-10-CM | POA: Diagnosis not present

## 2021-06-12 DIAGNOSIS — Z992 Dependence on renal dialysis: Secondary | ICD-10-CM | POA: Diagnosis not present

## 2021-06-14 DIAGNOSIS — N2581 Secondary hyperparathyroidism of renal origin: Secondary | ICD-10-CM | POA: Diagnosis not present

## 2021-06-14 DIAGNOSIS — E1122 Type 2 diabetes mellitus with diabetic chronic kidney disease: Secondary | ICD-10-CM | POA: Diagnosis not present

## 2021-06-14 DIAGNOSIS — N186 End stage renal disease: Secondary | ICD-10-CM | POA: Diagnosis not present

## 2021-06-14 DIAGNOSIS — Z992 Dependence on renal dialysis: Secondary | ICD-10-CM | POA: Diagnosis not present

## 2021-06-14 DIAGNOSIS — D631 Anemia in chronic kidney disease: Secondary | ICD-10-CM | POA: Diagnosis not present

## 2021-06-14 DIAGNOSIS — D509 Iron deficiency anemia, unspecified: Secondary | ICD-10-CM | POA: Diagnosis not present

## 2021-06-16 DIAGNOSIS — N186 End stage renal disease: Secondary | ICD-10-CM | POA: Diagnosis not present

## 2021-06-16 DIAGNOSIS — I129 Hypertensive chronic kidney disease with stage 1 through stage 4 chronic kidney disease, or unspecified chronic kidney disease: Secondary | ICD-10-CM | POA: Diagnosis not present

## 2021-06-16 DIAGNOSIS — Z992 Dependence on renal dialysis: Secondary | ICD-10-CM | POA: Diagnosis not present

## 2021-06-17 DIAGNOSIS — E1122 Type 2 diabetes mellitus with diabetic chronic kidney disease: Secondary | ICD-10-CM | POA: Diagnosis not present

## 2021-06-17 DIAGNOSIS — N2581 Secondary hyperparathyroidism of renal origin: Secondary | ICD-10-CM | POA: Diagnosis not present

## 2021-06-17 DIAGNOSIS — D631 Anemia in chronic kidney disease: Secondary | ICD-10-CM | POA: Diagnosis not present

## 2021-06-17 DIAGNOSIS — N186 End stage renal disease: Secondary | ICD-10-CM | POA: Diagnosis not present

## 2021-06-17 DIAGNOSIS — Z992 Dependence on renal dialysis: Secondary | ICD-10-CM | POA: Diagnosis not present

## 2021-06-17 DIAGNOSIS — D509 Iron deficiency anemia, unspecified: Secondary | ICD-10-CM | POA: Diagnosis not present

## 2021-06-17 NOTE — Progress Notes (Signed)
Remote pacemaker transmission.   

## 2021-06-19 DIAGNOSIS — D631 Anemia in chronic kidney disease: Secondary | ICD-10-CM | POA: Diagnosis not present

## 2021-06-19 DIAGNOSIS — N2581 Secondary hyperparathyroidism of renal origin: Secondary | ICD-10-CM | POA: Diagnosis not present

## 2021-06-19 DIAGNOSIS — Z992 Dependence on renal dialysis: Secondary | ICD-10-CM | POA: Diagnosis not present

## 2021-06-19 DIAGNOSIS — E1122 Type 2 diabetes mellitus with diabetic chronic kidney disease: Secondary | ICD-10-CM | POA: Diagnosis not present

## 2021-06-19 DIAGNOSIS — N186 End stage renal disease: Secondary | ICD-10-CM | POA: Diagnosis not present

## 2021-06-19 DIAGNOSIS — D509 Iron deficiency anemia, unspecified: Secondary | ICD-10-CM | POA: Diagnosis not present

## 2021-06-21 DIAGNOSIS — D509 Iron deficiency anemia, unspecified: Secondary | ICD-10-CM | POA: Diagnosis not present

## 2021-06-21 DIAGNOSIS — N2581 Secondary hyperparathyroidism of renal origin: Secondary | ICD-10-CM | POA: Diagnosis not present

## 2021-06-21 DIAGNOSIS — D631 Anemia in chronic kidney disease: Secondary | ICD-10-CM | POA: Diagnosis not present

## 2021-06-21 DIAGNOSIS — Z992 Dependence on renal dialysis: Secondary | ICD-10-CM | POA: Diagnosis not present

## 2021-06-21 DIAGNOSIS — N186 End stage renal disease: Secondary | ICD-10-CM | POA: Diagnosis not present

## 2021-06-21 DIAGNOSIS — E1122 Type 2 diabetes mellitus with diabetic chronic kidney disease: Secondary | ICD-10-CM | POA: Diagnosis not present

## 2021-06-24 DIAGNOSIS — D631 Anemia in chronic kidney disease: Secondary | ICD-10-CM | POA: Diagnosis not present

## 2021-06-24 DIAGNOSIS — Z992 Dependence on renal dialysis: Secondary | ICD-10-CM | POA: Diagnosis not present

## 2021-06-24 DIAGNOSIS — N2581 Secondary hyperparathyroidism of renal origin: Secondary | ICD-10-CM | POA: Diagnosis not present

## 2021-06-24 DIAGNOSIS — D509 Iron deficiency anemia, unspecified: Secondary | ICD-10-CM | POA: Diagnosis not present

## 2021-06-24 DIAGNOSIS — N186 End stage renal disease: Secondary | ICD-10-CM | POA: Diagnosis not present

## 2021-06-24 DIAGNOSIS — E1122 Type 2 diabetes mellitus with diabetic chronic kidney disease: Secondary | ICD-10-CM | POA: Diagnosis not present

## 2021-06-26 DIAGNOSIS — N186 End stage renal disease: Secondary | ICD-10-CM | POA: Diagnosis not present

## 2021-06-26 DIAGNOSIS — D509 Iron deficiency anemia, unspecified: Secondary | ICD-10-CM | POA: Diagnosis not present

## 2021-06-26 DIAGNOSIS — D631 Anemia in chronic kidney disease: Secondary | ICD-10-CM | POA: Diagnosis not present

## 2021-06-26 DIAGNOSIS — E1122 Type 2 diabetes mellitus with diabetic chronic kidney disease: Secondary | ICD-10-CM | POA: Diagnosis not present

## 2021-06-26 DIAGNOSIS — Z992 Dependence on renal dialysis: Secondary | ICD-10-CM | POA: Diagnosis not present

## 2021-06-26 DIAGNOSIS — N2581 Secondary hyperparathyroidism of renal origin: Secondary | ICD-10-CM | POA: Diagnosis not present

## 2021-06-28 DIAGNOSIS — D509 Iron deficiency anemia, unspecified: Secondary | ICD-10-CM | POA: Diagnosis not present

## 2021-06-28 DIAGNOSIS — E1122 Type 2 diabetes mellitus with diabetic chronic kidney disease: Secondary | ICD-10-CM | POA: Diagnosis not present

## 2021-06-28 DIAGNOSIS — N2581 Secondary hyperparathyroidism of renal origin: Secondary | ICD-10-CM | POA: Diagnosis not present

## 2021-06-28 DIAGNOSIS — D631 Anemia in chronic kidney disease: Secondary | ICD-10-CM | POA: Diagnosis not present

## 2021-06-28 DIAGNOSIS — N186 End stage renal disease: Secondary | ICD-10-CM | POA: Diagnosis not present

## 2021-06-28 DIAGNOSIS — Z992 Dependence on renal dialysis: Secondary | ICD-10-CM | POA: Diagnosis not present

## 2021-06-30 DIAGNOSIS — Z992 Dependence on renal dialysis: Secondary | ICD-10-CM | POA: Diagnosis not present

## 2021-06-30 DIAGNOSIS — D631 Anemia in chronic kidney disease: Secondary | ICD-10-CM | POA: Diagnosis not present

## 2021-06-30 DIAGNOSIS — D509 Iron deficiency anemia, unspecified: Secondary | ICD-10-CM | POA: Diagnosis not present

## 2021-06-30 DIAGNOSIS — N186 End stage renal disease: Secondary | ICD-10-CM | POA: Diagnosis not present

## 2021-06-30 DIAGNOSIS — E1122 Type 2 diabetes mellitus with diabetic chronic kidney disease: Secondary | ICD-10-CM | POA: Diagnosis not present

## 2021-06-30 DIAGNOSIS — N2581 Secondary hyperparathyroidism of renal origin: Secondary | ICD-10-CM | POA: Diagnosis not present

## 2021-07-03 DIAGNOSIS — Z992 Dependence on renal dialysis: Secondary | ICD-10-CM | POA: Diagnosis not present

## 2021-07-03 DIAGNOSIS — N2581 Secondary hyperparathyroidism of renal origin: Secondary | ICD-10-CM | POA: Diagnosis not present

## 2021-07-03 DIAGNOSIS — N186 End stage renal disease: Secondary | ICD-10-CM | POA: Diagnosis not present

## 2021-07-03 DIAGNOSIS — D631 Anemia in chronic kidney disease: Secondary | ICD-10-CM | POA: Diagnosis not present

## 2021-07-03 DIAGNOSIS — D509 Iron deficiency anemia, unspecified: Secondary | ICD-10-CM | POA: Diagnosis not present

## 2021-07-03 DIAGNOSIS — E1122 Type 2 diabetes mellitus with diabetic chronic kidney disease: Secondary | ICD-10-CM | POA: Diagnosis not present

## 2021-07-05 DIAGNOSIS — E1122 Type 2 diabetes mellitus with diabetic chronic kidney disease: Secondary | ICD-10-CM | POA: Diagnosis not present

## 2021-07-05 DIAGNOSIS — N2581 Secondary hyperparathyroidism of renal origin: Secondary | ICD-10-CM | POA: Diagnosis not present

## 2021-07-05 DIAGNOSIS — D509 Iron deficiency anemia, unspecified: Secondary | ICD-10-CM | POA: Diagnosis not present

## 2021-07-05 DIAGNOSIS — Z992 Dependence on renal dialysis: Secondary | ICD-10-CM | POA: Diagnosis not present

## 2021-07-05 DIAGNOSIS — D631 Anemia in chronic kidney disease: Secondary | ICD-10-CM | POA: Diagnosis not present

## 2021-07-05 DIAGNOSIS — N186 End stage renal disease: Secondary | ICD-10-CM | POA: Diagnosis not present

## 2021-07-08 DIAGNOSIS — Z992 Dependence on renal dialysis: Secondary | ICD-10-CM | POA: Diagnosis not present

## 2021-07-08 DIAGNOSIS — N186 End stage renal disease: Secondary | ICD-10-CM | POA: Diagnosis not present

## 2021-07-08 DIAGNOSIS — D509 Iron deficiency anemia, unspecified: Secondary | ICD-10-CM | POA: Diagnosis not present

## 2021-07-08 DIAGNOSIS — D631 Anemia in chronic kidney disease: Secondary | ICD-10-CM | POA: Diagnosis not present

## 2021-07-08 DIAGNOSIS — N2581 Secondary hyperparathyroidism of renal origin: Secondary | ICD-10-CM | POA: Diagnosis not present

## 2021-07-08 DIAGNOSIS — E1122 Type 2 diabetes mellitus with diabetic chronic kidney disease: Secondary | ICD-10-CM | POA: Diagnosis not present

## 2021-07-10 DIAGNOSIS — N2581 Secondary hyperparathyroidism of renal origin: Secondary | ICD-10-CM | POA: Diagnosis not present

## 2021-07-10 DIAGNOSIS — Z992 Dependence on renal dialysis: Secondary | ICD-10-CM | POA: Diagnosis not present

## 2021-07-10 DIAGNOSIS — D631 Anemia in chronic kidney disease: Secondary | ICD-10-CM | POA: Diagnosis not present

## 2021-07-10 DIAGNOSIS — N186 End stage renal disease: Secondary | ICD-10-CM | POA: Diagnosis not present

## 2021-07-10 DIAGNOSIS — E1122 Type 2 diabetes mellitus with diabetic chronic kidney disease: Secondary | ICD-10-CM | POA: Diagnosis not present

## 2021-07-10 DIAGNOSIS — D509 Iron deficiency anemia, unspecified: Secondary | ICD-10-CM | POA: Diagnosis not present

## 2021-07-12 DIAGNOSIS — N2581 Secondary hyperparathyroidism of renal origin: Secondary | ICD-10-CM | POA: Diagnosis not present

## 2021-07-12 DIAGNOSIS — D631 Anemia in chronic kidney disease: Secondary | ICD-10-CM | POA: Diagnosis not present

## 2021-07-12 DIAGNOSIS — N186 End stage renal disease: Secondary | ICD-10-CM | POA: Diagnosis not present

## 2021-07-12 DIAGNOSIS — D509 Iron deficiency anemia, unspecified: Secondary | ICD-10-CM | POA: Diagnosis not present

## 2021-07-12 DIAGNOSIS — E1122 Type 2 diabetes mellitus with diabetic chronic kidney disease: Secondary | ICD-10-CM | POA: Diagnosis not present

## 2021-07-12 DIAGNOSIS — Z992 Dependence on renal dialysis: Secondary | ICD-10-CM | POA: Diagnosis not present

## 2021-07-15 DIAGNOSIS — N186 End stage renal disease: Secondary | ICD-10-CM | POA: Diagnosis not present

## 2021-07-15 DIAGNOSIS — D509 Iron deficiency anemia, unspecified: Secondary | ICD-10-CM | POA: Diagnosis not present

## 2021-07-15 DIAGNOSIS — E1122 Type 2 diabetes mellitus with diabetic chronic kidney disease: Secondary | ICD-10-CM | POA: Diagnosis not present

## 2021-07-15 DIAGNOSIS — Z992 Dependence on renal dialysis: Secondary | ICD-10-CM | POA: Diagnosis not present

## 2021-07-15 DIAGNOSIS — N2581 Secondary hyperparathyroidism of renal origin: Secondary | ICD-10-CM | POA: Diagnosis not present

## 2021-07-15 DIAGNOSIS — D631 Anemia in chronic kidney disease: Secondary | ICD-10-CM | POA: Diagnosis not present

## 2021-07-17 DIAGNOSIS — Z992 Dependence on renal dialysis: Secondary | ICD-10-CM | POA: Diagnosis not present

## 2021-07-17 DIAGNOSIS — D631 Anemia in chronic kidney disease: Secondary | ICD-10-CM | POA: Diagnosis not present

## 2021-07-17 DIAGNOSIS — N186 End stage renal disease: Secondary | ICD-10-CM | POA: Diagnosis not present

## 2021-07-17 DIAGNOSIS — N2581 Secondary hyperparathyroidism of renal origin: Secondary | ICD-10-CM | POA: Diagnosis not present

## 2021-07-17 DIAGNOSIS — I129 Hypertensive chronic kidney disease with stage 1 through stage 4 chronic kidney disease, or unspecified chronic kidney disease: Secondary | ICD-10-CM | POA: Diagnosis not present

## 2021-07-17 DIAGNOSIS — E1122 Type 2 diabetes mellitus with diabetic chronic kidney disease: Secondary | ICD-10-CM | POA: Diagnosis not present

## 2021-07-17 DIAGNOSIS — D509 Iron deficiency anemia, unspecified: Secondary | ICD-10-CM | POA: Diagnosis not present

## 2021-07-19 DIAGNOSIS — D509 Iron deficiency anemia, unspecified: Secondary | ICD-10-CM | POA: Diagnosis not present

## 2021-07-19 DIAGNOSIS — N186 End stage renal disease: Secondary | ICD-10-CM | POA: Diagnosis not present

## 2021-07-19 DIAGNOSIS — Z992 Dependence on renal dialysis: Secondary | ICD-10-CM | POA: Diagnosis not present

## 2021-07-19 DIAGNOSIS — E1122 Type 2 diabetes mellitus with diabetic chronic kidney disease: Secondary | ICD-10-CM | POA: Diagnosis not present

## 2021-07-19 DIAGNOSIS — N2581 Secondary hyperparathyroidism of renal origin: Secondary | ICD-10-CM | POA: Diagnosis not present

## 2021-07-19 DIAGNOSIS — D631 Anemia in chronic kidney disease: Secondary | ICD-10-CM | POA: Diagnosis not present

## 2021-07-22 DIAGNOSIS — N186 End stage renal disease: Secondary | ICD-10-CM | POA: Diagnosis not present

## 2021-07-22 DIAGNOSIS — D631 Anemia in chronic kidney disease: Secondary | ICD-10-CM | POA: Diagnosis not present

## 2021-07-22 DIAGNOSIS — E1122 Type 2 diabetes mellitus with diabetic chronic kidney disease: Secondary | ICD-10-CM | POA: Diagnosis not present

## 2021-07-22 DIAGNOSIS — Z992 Dependence on renal dialysis: Secondary | ICD-10-CM | POA: Diagnosis not present

## 2021-07-22 DIAGNOSIS — D509 Iron deficiency anemia, unspecified: Secondary | ICD-10-CM | POA: Diagnosis not present

## 2021-07-22 DIAGNOSIS — N2581 Secondary hyperparathyroidism of renal origin: Secondary | ICD-10-CM | POA: Diagnosis not present

## 2021-07-24 DIAGNOSIS — N186 End stage renal disease: Secondary | ICD-10-CM | POA: Diagnosis not present

## 2021-07-24 DIAGNOSIS — E1122 Type 2 diabetes mellitus with diabetic chronic kidney disease: Secondary | ICD-10-CM | POA: Diagnosis not present

## 2021-07-24 DIAGNOSIS — D631 Anemia in chronic kidney disease: Secondary | ICD-10-CM | POA: Diagnosis not present

## 2021-07-24 DIAGNOSIS — N2581 Secondary hyperparathyroidism of renal origin: Secondary | ICD-10-CM | POA: Diagnosis not present

## 2021-07-24 DIAGNOSIS — Z992 Dependence on renal dialysis: Secondary | ICD-10-CM | POA: Diagnosis not present

## 2021-07-24 DIAGNOSIS — D509 Iron deficiency anemia, unspecified: Secondary | ICD-10-CM | POA: Diagnosis not present

## 2021-07-26 DIAGNOSIS — E1122 Type 2 diabetes mellitus with diabetic chronic kidney disease: Secondary | ICD-10-CM | POA: Diagnosis not present

## 2021-07-26 DIAGNOSIS — D509 Iron deficiency anemia, unspecified: Secondary | ICD-10-CM | POA: Diagnosis not present

## 2021-07-26 DIAGNOSIS — N186 End stage renal disease: Secondary | ICD-10-CM | POA: Diagnosis not present

## 2021-07-26 DIAGNOSIS — D631 Anemia in chronic kidney disease: Secondary | ICD-10-CM | POA: Diagnosis not present

## 2021-07-26 DIAGNOSIS — N2581 Secondary hyperparathyroidism of renal origin: Secondary | ICD-10-CM | POA: Diagnosis not present

## 2021-07-26 DIAGNOSIS — Z992 Dependence on renal dialysis: Secondary | ICD-10-CM | POA: Diagnosis not present

## 2021-07-29 DIAGNOSIS — N186 End stage renal disease: Secondary | ICD-10-CM | POA: Diagnosis not present

## 2021-07-29 DIAGNOSIS — E1122 Type 2 diabetes mellitus with diabetic chronic kidney disease: Secondary | ICD-10-CM | POA: Diagnosis not present

## 2021-07-29 DIAGNOSIS — D631 Anemia in chronic kidney disease: Secondary | ICD-10-CM | POA: Diagnosis not present

## 2021-07-29 DIAGNOSIS — Z992 Dependence on renal dialysis: Secondary | ICD-10-CM | POA: Diagnosis not present

## 2021-07-29 DIAGNOSIS — D509 Iron deficiency anemia, unspecified: Secondary | ICD-10-CM | POA: Diagnosis not present

## 2021-07-29 DIAGNOSIS — N2581 Secondary hyperparathyroidism of renal origin: Secondary | ICD-10-CM | POA: Diagnosis not present

## 2021-07-31 DIAGNOSIS — D631 Anemia in chronic kidney disease: Secondary | ICD-10-CM | POA: Diagnosis not present

## 2021-07-31 DIAGNOSIS — D509 Iron deficiency anemia, unspecified: Secondary | ICD-10-CM | POA: Diagnosis not present

## 2021-07-31 DIAGNOSIS — N2581 Secondary hyperparathyroidism of renal origin: Secondary | ICD-10-CM | POA: Diagnosis not present

## 2021-07-31 DIAGNOSIS — E1122 Type 2 diabetes mellitus with diabetic chronic kidney disease: Secondary | ICD-10-CM | POA: Diagnosis not present

## 2021-07-31 DIAGNOSIS — Z992 Dependence on renal dialysis: Secondary | ICD-10-CM | POA: Diagnosis not present

## 2021-07-31 DIAGNOSIS — N186 End stage renal disease: Secondary | ICD-10-CM | POA: Diagnosis not present

## 2021-08-02 DIAGNOSIS — E1122 Type 2 diabetes mellitus with diabetic chronic kidney disease: Secondary | ICD-10-CM | POA: Diagnosis not present

## 2021-08-02 DIAGNOSIS — Z992 Dependence on renal dialysis: Secondary | ICD-10-CM | POA: Diagnosis not present

## 2021-08-02 DIAGNOSIS — D631 Anemia in chronic kidney disease: Secondary | ICD-10-CM | POA: Diagnosis not present

## 2021-08-02 DIAGNOSIS — D509 Iron deficiency anemia, unspecified: Secondary | ICD-10-CM | POA: Diagnosis not present

## 2021-08-02 DIAGNOSIS — N2581 Secondary hyperparathyroidism of renal origin: Secondary | ICD-10-CM | POA: Diagnosis not present

## 2021-08-02 DIAGNOSIS — N186 End stage renal disease: Secondary | ICD-10-CM | POA: Diagnosis not present

## 2021-08-05 DIAGNOSIS — D631 Anemia in chronic kidney disease: Secondary | ICD-10-CM | POA: Diagnosis not present

## 2021-08-05 DIAGNOSIS — E1122 Type 2 diabetes mellitus with diabetic chronic kidney disease: Secondary | ICD-10-CM | POA: Diagnosis not present

## 2021-08-05 DIAGNOSIS — N2581 Secondary hyperparathyroidism of renal origin: Secondary | ICD-10-CM | POA: Diagnosis not present

## 2021-08-05 DIAGNOSIS — N186 End stage renal disease: Secondary | ICD-10-CM | POA: Diagnosis not present

## 2021-08-05 DIAGNOSIS — Z992 Dependence on renal dialysis: Secondary | ICD-10-CM | POA: Diagnosis not present

## 2021-08-05 DIAGNOSIS — D509 Iron deficiency anemia, unspecified: Secondary | ICD-10-CM | POA: Diagnosis not present

## 2021-08-06 ENCOUNTER — Telehealth: Payer: Self-pay

## 2021-08-06 NOTE — Telephone Encounter (Signed)
Pt's wife, Clarise Cruz, called stating that he wanted a 2nd opinion for blood flow to the arm with his fistula.  Returned wife's call for clarification, two identifiers use.d Pt was c/o weakness in his legs and arms, and numbness in his hands when gripping things. He states that he is not having any problems with his fistula or dialysis. Pt is requesting Dr Scot Dock only. Informed wife that he would need to seek a referral from his PCP, if his symptoms were vascular related.  Called wife to clarify that pt's PCP would need to send a referral for pt's symptoms if vascular related. They would the option to request Scot Dock at that time. Informed her that if pt's fistula was causing any issues, the dialysis center would send a referral for that. Confirmed understanding.

## 2021-08-07 DIAGNOSIS — E1122 Type 2 diabetes mellitus with diabetic chronic kidney disease: Secondary | ICD-10-CM | POA: Diagnosis not present

## 2021-08-07 DIAGNOSIS — Z992 Dependence on renal dialysis: Secondary | ICD-10-CM | POA: Diagnosis not present

## 2021-08-07 DIAGNOSIS — N186 End stage renal disease: Secondary | ICD-10-CM | POA: Diagnosis not present

## 2021-08-07 DIAGNOSIS — N2581 Secondary hyperparathyroidism of renal origin: Secondary | ICD-10-CM | POA: Diagnosis not present

## 2021-08-07 DIAGNOSIS — D509 Iron deficiency anemia, unspecified: Secondary | ICD-10-CM | POA: Diagnosis not present

## 2021-08-07 DIAGNOSIS — D631 Anemia in chronic kidney disease: Secondary | ICD-10-CM | POA: Diagnosis not present

## 2021-08-09 DIAGNOSIS — Z992 Dependence on renal dialysis: Secondary | ICD-10-CM | POA: Diagnosis not present

## 2021-08-09 DIAGNOSIS — D509 Iron deficiency anemia, unspecified: Secondary | ICD-10-CM | POA: Diagnosis not present

## 2021-08-09 DIAGNOSIS — N2581 Secondary hyperparathyroidism of renal origin: Secondary | ICD-10-CM | POA: Diagnosis not present

## 2021-08-09 DIAGNOSIS — D631 Anemia in chronic kidney disease: Secondary | ICD-10-CM | POA: Diagnosis not present

## 2021-08-09 DIAGNOSIS — E1122 Type 2 diabetes mellitus with diabetic chronic kidney disease: Secondary | ICD-10-CM | POA: Diagnosis not present

## 2021-08-09 DIAGNOSIS — N186 End stage renal disease: Secondary | ICD-10-CM | POA: Diagnosis not present

## 2021-08-12 DIAGNOSIS — N2581 Secondary hyperparathyroidism of renal origin: Secondary | ICD-10-CM | POA: Diagnosis not present

## 2021-08-12 DIAGNOSIS — N186 End stage renal disease: Secondary | ICD-10-CM | POA: Diagnosis not present

## 2021-08-12 DIAGNOSIS — Z992 Dependence on renal dialysis: Secondary | ICD-10-CM | POA: Diagnosis not present

## 2021-08-12 DIAGNOSIS — D509 Iron deficiency anemia, unspecified: Secondary | ICD-10-CM | POA: Diagnosis not present

## 2021-08-12 DIAGNOSIS — D631 Anemia in chronic kidney disease: Secondary | ICD-10-CM | POA: Diagnosis not present

## 2021-08-12 DIAGNOSIS — E1122 Type 2 diabetes mellitus with diabetic chronic kidney disease: Secondary | ICD-10-CM | POA: Diagnosis not present

## 2021-08-13 DIAGNOSIS — M79605 Pain in left leg: Secondary | ICD-10-CM | POA: Diagnosis not present

## 2021-08-13 DIAGNOSIS — M79604 Pain in right leg: Secondary | ICD-10-CM | POA: Diagnosis not present

## 2021-08-14 DIAGNOSIS — Z992 Dependence on renal dialysis: Secondary | ICD-10-CM | POA: Diagnosis not present

## 2021-08-14 DIAGNOSIS — N186 End stage renal disease: Secondary | ICD-10-CM | POA: Diagnosis not present

## 2021-08-14 DIAGNOSIS — N2581 Secondary hyperparathyroidism of renal origin: Secondary | ICD-10-CM | POA: Diagnosis not present

## 2021-08-14 DIAGNOSIS — D631 Anemia in chronic kidney disease: Secondary | ICD-10-CM | POA: Diagnosis not present

## 2021-08-14 DIAGNOSIS — E1122 Type 2 diabetes mellitus with diabetic chronic kidney disease: Secondary | ICD-10-CM | POA: Diagnosis not present

## 2021-08-14 DIAGNOSIS — D509 Iron deficiency anemia, unspecified: Secondary | ICD-10-CM | POA: Diagnosis not present

## 2021-08-16 DIAGNOSIS — N2581 Secondary hyperparathyroidism of renal origin: Secondary | ICD-10-CM | POA: Diagnosis not present

## 2021-08-16 DIAGNOSIS — Z992 Dependence on renal dialysis: Secondary | ICD-10-CM | POA: Diagnosis not present

## 2021-08-16 DIAGNOSIS — E1122 Type 2 diabetes mellitus with diabetic chronic kidney disease: Secondary | ICD-10-CM | POA: Diagnosis not present

## 2021-08-16 DIAGNOSIS — D631 Anemia in chronic kidney disease: Secondary | ICD-10-CM | POA: Diagnosis not present

## 2021-08-16 DIAGNOSIS — I129 Hypertensive chronic kidney disease with stage 1 through stage 4 chronic kidney disease, or unspecified chronic kidney disease: Secondary | ICD-10-CM | POA: Diagnosis not present

## 2021-08-16 DIAGNOSIS — N186 End stage renal disease: Secondary | ICD-10-CM | POA: Diagnosis not present

## 2021-08-16 DIAGNOSIS — D509 Iron deficiency anemia, unspecified: Secondary | ICD-10-CM | POA: Diagnosis not present

## 2021-08-19 DIAGNOSIS — Z992 Dependence on renal dialysis: Secondary | ICD-10-CM | POA: Diagnosis not present

## 2021-08-19 DIAGNOSIS — N2581 Secondary hyperparathyroidism of renal origin: Secondary | ICD-10-CM | POA: Diagnosis not present

## 2021-08-19 DIAGNOSIS — D631 Anemia in chronic kidney disease: Secondary | ICD-10-CM | POA: Diagnosis not present

## 2021-08-19 DIAGNOSIS — E1122 Type 2 diabetes mellitus with diabetic chronic kidney disease: Secondary | ICD-10-CM | POA: Diagnosis not present

## 2021-08-19 DIAGNOSIS — D509 Iron deficiency anemia, unspecified: Secondary | ICD-10-CM | POA: Diagnosis not present

## 2021-08-19 DIAGNOSIS — N186 End stage renal disease: Secondary | ICD-10-CM | POA: Diagnosis not present

## 2021-08-21 DIAGNOSIS — N2581 Secondary hyperparathyroidism of renal origin: Secondary | ICD-10-CM | POA: Diagnosis not present

## 2021-08-21 DIAGNOSIS — D509 Iron deficiency anemia, unspecified: Secondary | ICD-10-CM | POA: Diagnosis not present

## 2021-08-21 DIAGNOSIS — D631 Anemia in chronic kidney disease: Secondary | ICD-10-CM | POA: Diagnosis not present

## 2021-08-21 DIAGNOSIS — E1122 Type 2 diabetes mellitus with diabetic chronic kidney disease: Secondary | ICD-10-CM | POA: Diagnosis not present

## 2021-08-21 DIAGNOSIS — N186 End stage renal disease: Secondary | ICD-10-CM | POA: Diagnosis not present

## 2021-08-21 DIAGNOSIS — Z992 Dependence on renal dialysis: Secondary | ICD-10-CM | POA: Diagnosis not present

## 2021-08-22 ENCOUNTER — Other Ambulatory Visit (HOSPITAL_COMMUNITY): Payer: Self-pay | Admitting: Internal Medicine

## 2021-08-22 DIAGNOSIS — R52 Pain, unspecified: Secondary | ICD-10-CM

## 2021-08-23 DIAGNOSIS — E1122 Type 2 diabetes mellitus with diabetic chronic kidney disease: Secondary | ICD-10-CM | POA: Diagnosis not present

## 2021-08-23 DIAGNOSIS — Z992 Dependence on renal dialysis: Secondary | ICD-10-CM | POA: Diagnosis not present

## 2021-08-23 DIAGNOSIS — N2581 Secondary hyperparathyroidism of renal origin: Secondary | ICD-10-CM | POA: Diagnosis not present

## 2021-08-23 DIAGNOSIS — D509 Iron deficiency anemia, unspecified: Secondary | ICD-10-CM | POA: Diagnosis not present

## 2021-08-23 DIAGNOSIS — N186 End stage renal disease: Secondary | ICD-10-CM | POA: Diagnosis not present

## 2021-08-23 DIAGNOSIS — D631 Anemia in chronic kidney disease: Secondary | ICD-10-CM | POA: Diagnosis not present

## 2021-08-25 ENCOUNTER — Ambulatory Visit (HOSPITAL_COMMUNITY)
Admission: RE | Admit: 2021-08-25 | Discharge: 2021-08-25 | Disposition: A | Discharge status: Home / Self Care | Payer: Medicare Other | Source: Ambulatory Visit | Attending: Internal Medicine | Admitting: Internal Medicine

## 2021-08-25 DIAGNOSIS — R52 Pain, unspecified: Secondary | ICD-10-CM | POA: Insufficient documentation

## 2021-08-26 DIAGNOSIS — D631 Anemia in chronic kidney disease: Secondary | ICD-10-CM | POA: Diagnosis not present

## 2021-08-26 DIAGNOSIS — N2581 Secondary hyperparathyroidism of renal origin: Secondary | ICD-10-CM | POA: Diagnosis not present

## 2021-08-26 DIAGNOSIS — E1122 Type 2 diabetes mellitus with diabetic chronic kidney disease: Secondary | ICD-10-CM | POA: Diagnosis not present

## 2021-08-26 DIAGNOSIS — Z992 Dependence on renal dialysis: Secondary | ICD-10-CM | POA: Diagnosis not present

## 2021-08-26 DIAGNOSIS — N186 End stage renal disease: Secondary | ICD-10-CM | POA: Diagnosis not present

## 2021-08-26 DIAGNOSIS — D509 Iron deficiency anemia, unspecified: Secondary | ICD-10-CM | POA: Diagnosis not present

## 2021-08-28 DIAGNOSIS — E1122 Type 2 diabetes mellitus with diabetic chronic kidney disease: Secondary | ICD-10-CM | POA: Diagnosis not present

## 2021-08-28 DIAGNOSIS — D631 Anemia in chronic kidney disease: Secondary | ICD-10-CM | POA: Diagnosis not present

## 2021-08-28 DIAGNOSIS — D509 Iron deficiency anemia, unspecified: Secondary | ICD-10-CM | POA: Diagnosis not present

## 2021-08-28 DIAGNOSIS — Z992 Dependence on renal dialysis: Secondary | ICD-10-CM | POA: Diagnosis not present

## 2021-08-28 DIAGNOSIS — N186 End stage renal disease: Secondary | ICD-10-CM | POA: Diagnosis not present

## 2021-08-28 DIAGNOSIS — N2581 Secondary hyperparathyroidism of renal origin: Secondary | ICD-10-CM | POA: Diagnosis not present

## 2021-08-29 ENCOUNTER — Telehealth: Payer: Self-pay

## 2021-08-29 ENCOUNTER — Ambulatory Visit (INDEPENDENT_AMBULATORY_CARE_PROVIDER_SITE_OTHER): Payer: Medicare Other

## 2021-08-29 DIAGNOSIS — I5032 Chronic diastolic (congestive) heart failure: Secondary | ICD-10-CM | POA: Diagnosis not present

## 2021-08-29 DIAGNOSIS — Z992 Dependence on renal dialysis: Secondary | ICD-10-CM | POA: Diagnosis not present

## 2021-08-29 DIAGNOSIS — I495 Sick sinus syndrome: Secondary | ICD-10-CM | POA: Diagnosis not present

## 2021-08-29 DIAGNOSIS — I120 Hypertensive chronic kidney disease with stage 5 chronic kidney disease or end stage renal disease: Secondary | ICD-10-CM | POA: Diagnosis not present

## 2021-08-29 DIAGNOSIS — D6869 Other thrombophilia: Secondary | ICD-10-CM | POA: Diagnosis not present

## 2021-08-29 DIAGNOSIS — I25810 Atherosclerosis of coronary artery bypass graft(s) without angina pectoris: Secondary | ICD-10-CM | POA: Diagnosis not present

## 2021-08-29 DIAGNOSIS — E213 Hyperparathyroidism, unspecified: Secondary | ICD-10-CM | POA: Diagnosis not present

## 2021-08-29 DIAGNOSIS — N186 End stage renal disease: Secondary | ICD-10-CM | POA: Diagnosis not present

## 2021-08-29 DIAGNOSIS — I700 Atherosclerosis of aorta: Secondary | ICD-10-CM | POA: Diagnosis not present

## 2021-08-29 DIAGNOSIS — I4891 Unspecified atrial fibrillation: Secondary | ICD-10-CM | POA: Diagnosis not present

## 2021-08-29 DIAGNOSIS — Z794 Long term (current) use of insulin: Secondary | ICD-10-CM | POA: Diagnosis not present

## 2021-08-29 DIAGNOSIS — E1169 Type 2 diabetes mellitus with other specified complication: Secondary | ICD-10-CM | POA: Diagnosis not present

## 2021-08-29 DIAGNOSIS — I739 Peripheral vascular disease, unspecified: Secondary | ICD-10-CM | POA: Diagnosis not present

## 2021-08-29 LAB — CUP PACEART REMOTE DEVICE CHECK
Battery Remaining Longevity: 126 mo
Battery Voltage: 3.02 V
Brady Statistic AP VP Percent: 0 %
Brady Statistic AP VS Percent: 5.29 %
Brady Statistic AS VP Percent: 0.04 %
Brady Statistic AS VS Percent: 94.67 %
Brady Statistic RA Percent Paced: 5.59 %
Brady Statistic RV Percent Paced: 0.04 %
Date Time Interrogation Session: 20230713235259
Implantable Lead Implant Date: 20020325
Implantable Lead Implant Date: 20020325
Implantable Lead Location: 753859
Implantable Lead Location: 753860
Implantable Lead Model: 4092
Implantable Lead Model: 4524
Implantable Pulse Generator Implant Date: 20210415
Lead Channel Impedance Value: 247 Ohm
Lead Channel Impedance Value: 304 Ohm
Lead Channel Impedance Value: 456 Ohm
Lead Channel Impedance Value: 608 Ohm
Lead Channel Pacing Threshold Amplitude: 0.625 V
Lead Channel Pacing Threshold Amplitude: 2.5 V
Lead Channel Pacing Threshold Pulse Width: 0.4 ms
Lead Channel Pacing Threshold Pulse Width: 0.4 ms
Lead Channel Sensing Intrinsic Amplitude: 0.5 mV
Lead Channel Sensing Intrinsic Amplitude: 0.5 mV
Lead Channel Sensing Intrinsic Amplitude: 19.5 mV
Lead Channel Sensing Intrinsic Amplitude: 19.5 mV
Lead Channel Setting Pacing Amplitude: 2.5 V
Lead Channel Setting Pacing Amplitude: 3 V
Lead Channel Setting Pacing Pulse Width: 0.4 ms
Lead Channel Setting Sensing Sensitivity: 1.2 mV

## 2021-08-29 NOTE — Telephone Encounter (Signed)
Scheduled remote reviewed. Normal device function.   3 NSVT, 7/11 event >20 beats, mean HR 185.  Route to triage per protocol Next remote 91 days. LA  Successful telephone encounter to patient and wife to follow up on symptoms of NSVT and schedule an in clinic check to gain up the A EGM for review of p waves. History of AF. V morphology unchanged and V-V irregular. Reviewed with Jens Som who advises in clinic appointment. Patient overdue by a year. Wife states patient is doing well. Dialysis T, Th, S. Chronically high potassium. Wife states 5.8 this week. Patient refuses to follow renal diet and continues to eat a banana and orange daily. Advised patient that an in clinic appointment is needed however he strongly refuses to come in for appointment. Advised to call back if he wishes to be seen in clinic. Will continue to monitor remotes.

## 2021-08-30 DIAGNOSIS — N186 End stage renal disease: Secondary | ICD-10-CM | POA: Diagnosis not present

## 2021-08-30 DIAGNOSIS — Z992 Dependence on renal dialysis: Secondary | ICD-10-CM | POA: Diagnosis not present

## 2021-08-30 DIAGNOSIS — D509 Iron deficiency anemia, unspecified: Secondary | ICD-10-CM | POA: Diagnosis not present

## 2021-08-30 DIAGNOSIS — E1122 Type 2 diabetes mellitus with diabetic chronic kidney disease: Secondary | ICD-10-CM | POA: Diagnosis not present

## 2021-08-30 DIAGNOSIS — D631 Anemia in chronic kidney disease: Secondary | ICD-10-CM | POA: Diagnosis not present

## 2021-08-30 DIAGNOSIS — N2581 Secondary hyperparathyroidism of renal origin: Secondary | ICD-10-CM | POA: Diagnosis not present

## 2021-09-02 DIAGNOSIS — N2581 Secondary hyperparathyroidism of renal origin: Secondary | ICD-10-CM | POA: Diagnosis not present

## 2021-09-02 DIAGNOSIS — D509 Iron deficiency anemia, unspecified: Secondary | ICD-10-CM | POA: Diagnosis not present

## 2021-09-02 DIAGNOSIS — D631 Anemia in chronic kidney disease: Secondary | ICD-10-CM | POA: Diagnosis not present

## 2021-09-02 DIAGNOSIS — Z992 Dependence on renal dialysis: Secondary | ICD-10-CM | POA: Diagnosis not present

## 2021-09-02 DIAGNOSIS — N186 End stage renal disease: Secondary | ICD-10-CM | POA: Diagnosis not present

## 2021-09-02 DIAGNOSIS — E1122 Type 2 diabetes mellitus with diabetic chronic kidney disease: Secondary | ICD-10-CM | POA: Diagnosis not present

## 2021-09-04 DIAGNOSIS — Z992 Dependence on renal dialysis: Secondary | ICD-10-CM | POA: Diagnosis not present

## 2021-09-04 DIAGNOSIS — N186 End stage renal disease: Secondary | ICD-10-CM | POA: Diagnosis not present

## 2021-09-04 DIAGNOSIS — E1122 Type 2 diabetes mellitus with diabetic chronic kidney disease: Secondary | ICD-10-CM | POA: Diagnosis not present

## 2021-09-04 DIAGNOSIS — D631 Anemia in chronic kidney disease: Secondary | ICD-10-CM | POA: Diagnosis not present

## 2021-09-04 DIAGNOSIS — N2581 Secondary hyperparathyroidism of renal origin: Secondary | ICD-10-CM | POA: Diagnosis not present

## 2021-09-04 DIAGNOSIS — E039 Hypothyroidism, unspecified: Secondary | ICD-10-CM | POA: Diagnosis not present

## 2021-09-04 DIAGNOSIS — D509 Iron deficiency anemia, unspecified: Secondary | ICD-10-CM | POA: Diagnosis not present

## 2021-09-06 DIAGNOSIS — D631 Anemia in chronic kidney disease: Secondary | ICD-10-CM | POA: Diagnosis not present

## 2021-09-06 DIAGNOSIS — E1122 Type 2 diabetes mellitus with diabetic chronic kidney disease: Secondary | ICD-10-CM | POA: Diagnosis not present

## 2021-09-06 DIAGNOSIS — N2581 Secondary hyperparathyroidism of renal origin: Secondary | ICD-10-CM | POA: Diagnosis not present

## 2021-09-06 DIAGNOSIS — Z992 Dependence on renal dialysis: Secondary | ICD-10-CM | POA: Diagnosis not present

## 2021-09-06 DIAGNOSIS — N186 End stage renal disease: Secondary | ICD-10-CM | POA: Diagnosis not present

## 2021-09-06 DIAGNOSIS — D509 Iron deficiency anemia, unspecified: Secondary | ICD-10-CM | POA: Diagnosis not present

## 2021-09-09 DIAGNOSIS — N186 End stage renal disease: Secondary | ICD-10-CM | POA: Diagnosis not present

## 2021-09-09 DIAGNOSIS — N2581 Secondary hyperparathyroidism of renal origin: Secondary | ICD-10-CM | POA: Diagnosis not present

## 2021-09-09 DIAGNOSIS — Z992 Dependence on renal dialysis: Secondary | ICD-10-CM | POA: Diagnosis not present

## 2021-09-09 DIAGNOSIS — D631 Anemia in chronic kidney disease: Secondary | ICD-10-CM | POA: Diagnosis not present

## 2021-09-09 DIAGNOSIS — D509 Iron deficiency anemia, unspecified: Secondary | ICD-10-CM | POA: Diagnosis not present

## 2021-09-09 DIAGNOSIS — E1122 Type 2 diabetes mellitus with diabetic chronic kidney disease: Secondary | ICD-10-CM | POA: Diagnosis not present

## 2021-09-11 DIAGNOSIS — D509 Iron deficiency anemia, unspecified: Secondary | ICD-10-CM | POA: Diagnosis not present

## 2021-09-11 DIAGNOSIS — N2581 Secondary hyperparathyroidism of renal origin: Secondary | ICD-10-CM | POA: Diagnosis not present

## 2021-09-11 DIAGNOSIS — Z992 Dependence on renal dialysis: Secondary | ICD-10-CM | POA: Diagnosis not present

## 2021-09-11 DIAGNOSIS — D631 Anemia in chronic kidney disease: Secondary | ICD-10-CM | POA: Diagnosis not present

## 2021-09-11 DIAGNOSIS — E1122 Type 2 diabetes mellitus with diabetic chronic kidney disease: Secondary | ICD-10-CM | POA: Diagnosis not present

## 2021-09-11 DIAGNOSIS — N186 End stage renal disease: Secondary | ICD-10-CM | POA: Diagnosis not present

## 2021-09-11 NOTE — Progress Notes (Signed)
Remote pacemaker transmission.   

## 2021-09-13 DIAGNOSIS — Z992 Dependence on renal dialysis: Secondary | ICD-10-CM | POA: Diagnosis not present

## 2021-09-13 DIAGNOSIS — N186 End stage renal disease: Secondary | ICD-10-CM | POA: Diagnosis not present

## 2021-09-13 DIAGNOSIS — N2581 Secondary hyperparathyroidism of renal origin: Secondary | ICD-10-CM | POA: Diagnosis not present

## 2021-09-13 DIAGNOSIS — D509 Iron deficiency anemia, unspecified: Secondary | ICD-10-CM | POA: Diagnosis not present

## 2021-09-13 DIAGNOSIS — E1122 Type 2 diabetes mellitus with diabetic chronic kidney disease: Secondary | ICD-10-CM | POA: Diagnosis not present

## 2021-09-13 DIAGNOSIS — D631 Anemia in chronic kidney disease: Secondary | ICD-10-CM | POA: Diagnosis not present

## 2021-09-16 DIAGNOSIS — Z992 Dependence on renal dialysis: Secondary | ICD-10-CM | POA: Diagnosis not present

## 2021-09-16 DIAGNOSIS — D509 Iron deficiency anemia, unspecified: Secondary | ICD-10-CM | POA: Diagnosis not present

## 2021-09-16 DIAGNOSIS — I129 Hypertensive chronic kidney disease with stage 1 through stage 4 chronic kidney disease, or unspecified chronic kidney disease: Secondary | ICD-10-CM | POA: Diagnosis not present

## 2021-09-16 DIAGNOSIS — N186 End stage renal disease: Secondary | ICD-10-CM | POA: Diagnosis not present

## 2021-09-16 DIAGNOSIS — N2581 Secondary hyperparathyroidism of renal origin: Secondary | ICD-10-CM | POA: Diagnosis not present

## 2021-09-16 DIAGNOSIS — E1122 Type 2 diabetes mellitus with diabetic chronic kidney disease: Secondary | ICD-10-CM | POA: Diagnosis not present

## 2021-09-16 DIAGNOSIS — D631 Anemia in chronic kidney disease: Secondary | ICD-10-CM | POA: Diagnosis not present

## 2021-09-18 DIAGNOSIS — D631 Anemia in chronic kidney disease: Secondary | ICD-10-CM | POA: Diagnosis not present

## 2021-09-18 DIAGNOSIS — N186 End stage renal disease: Secondary | ICD-10-CM | POA: Diagnosis not present

## 2021-09-18 DIAGNOSIS — N2581 Secondary hyperparathyroidism of renal origin: Secondary | ICD-10-CM | POA: Diagnosis not present

## 2021-09-18 DIAGNOSIS — Z992 Dependence on renal dialysis: Secondary | ICD-10-CM | POA: Diagnosis not present

## 2021-09-18 DIAGNOSIS — D509 Iron deficiency anemia, unspecified: Secondary | ICD-10-CM | POA: Diagnosis not present

## 2021-09-18 DIAGNOSIS — E1122 Type 2 diabetes mellitus with diabetic chronic kidney disease: Secondary | ICD-10-CM | POA: Diagnosis not present

## 2021-09-20 DIAGNOSIS — D509 Iron deficiency anemia, unspecified: Secondary | ICD-10-CM | POA: Diagnosis not present

## 2021-09-20 DIAGNOSIS — E1122 Type 2 diabetes mellitus with diabetic chronic kidney disease: Secondary | ICD-10-CM | POA: Diagnosis not present

## 2021-09-20 DIAGNOSIS — N186 End stage renal disease: Secondary | ICD-10-CM | POA: Diagnosis not present

## 2021-09-20 DIAGNOSIS — N2581 Secondary hyperparathyroidism of renal origin: Secondary | ICD-10-CM | POA: Diagnosis not present

## 2021-09-20 DIAGNOSIS — D631 Anemia in chronic kidney disease: Secondary | ICD-10-CM | POA: Diagnosis not present

## 2021-09-20 DIAGNOSIS — Z992 Dependence on renal dialysis: Secondary | ICD-10-CM | POA: Diagnosis not present

## 2021-09-23 DIAGNOSIS — D631 Anemia in chronic kidney disease: Secondary | ICD-10-CM | POA: Diagnosis not present

## 2021-09-23 DIAGNOSIS — N186 End stage renal disease: Secondary | ICD-10-CM | POA: Diagnosis not present

## 2021-09-23 DIAGNOSIS — N2581 Secondary hyperparathyroidism of renal origin: Secondary | ICD-10-CM | POA: Diagnosis not present

## 2021-09-23 DIAGNOSIS — E1122 Type 2 diabetes mellitus with diabetic chronic kidney disease: Secondary | ICD-10-CM | POA: Diagnosis not present

## 2021-09-23 DIAGNOSIS — Z992 Dependence on renal dialysis: Secondary | ICD-10-CM | POA: Diagnosis not present

## 2021-09-23 DIAGNOSIS — D509 Iron deficiency anemia, unspecified: Secondary | ICD-10-CM | POA: Diagnosis not present

## 2021-09-24 ENCOUNTER — Encounter: Payer: Self-pay | Admitting: Vascular Surgery

## 2021-09-24 ENCOUNTER — Ambulatory Visit (INDEPENDENT_AMBULATORY_CARE_PROVIDER_SITE_OTHER): Payer: Medicare Other | Admitting: Vascular Surgery

## 2021-09-24 VITALS — BP 132/76 | HR 94 | Temp 98.6°F | Ht 71.0 in | Wt 218.4 lb

## 2021-09-24 DIAGNOSIS — M25562 Pain in left knee: Secondary | ICD-10-CM | POA: Diagnosis not present

## 2021-09-24 DIAGNOSIS — N186 End stage renal disease: Secondary | ICD-10-CM

## 2021-09-24 DIAGNOSIS — M25561 Pain in right knee: Secondary | ICD-10-CM

## 2021-09-24 MED ORDER — GABAPENTIN 100 MG PO CAPS: 100.0000 mg | ORAL_CAPSULE | Freq: Three times a day (TID) | ORAL | 3 refills | Status: DC

## 2021-09-24 NOTE — Progress Notes (Signed)
Patient ID: Bradley Soto, male   DOB: [birth year], 81 y.o.   MRN: [id number]  Reason for Consult: Follow-up   Referred by [name], MD  Subjective:     HPI:  Bradley Soto is a 81 y.o. male with history of end-stage renal disease currently on dialysis Tuesdays, Thursdays and Saturdays via right arm AV fistula.  He does have numbness and started to have lack of function of both hands due to inability to feel.  He also has issues with pain in his mostly in his feet during dialysis but he also has numbness there as well.  He denies any tissue loss or ulceration.  Denies any previous history of vascular interventions of his lower extremities.  Past Medical History:  Diagnosis Date   Anxiety    Bell's palsy    left side of face droops   CAD (coronary artery disease)    s/p stent placement   CKD (chronic kidney disease), stage III (HCC)    [place]- [facility]- [facility]   Colon polyps    Diabetes mellitus without complication (HCC)    Gout    History of blood transfusion    History of kidney stones    Passes   HLD (hyperlipidemia)    HTN (hypertension)    Insomnia    MI (myocardial infarction) ([place]) 1998   NSTEMI (non-ST elevated myocardial infarction) ([place])    s/p PCI in early 2000s   OSA on CPAP    wears CPAP   Presence of permanent cardiac pacemaker    SSS (sick sinus syndrome) (HCC)    Type II or unspecified type diabetes mellitus without mention of complication, not stated as uncontrolled    Wears glasses    reading    Wears partial dentures    Family History  Problem Relation Age of Onset   Heart attack Father 57   Heart disease Father    Breast cancer Sister    Pulmonary embolism Sister    Deep vein thrombosis Sister    Past Surgical History:  Procedure Laterality Date   APPENDECTOMY     AV FISTULA PLACEMENT Right 07/28/2019   Procedure: RIGHT ARM Brachiocephalic ARTERIOVENOUS (AV) FISTULA CREATION;  Surgeon: Serafina Mitchell, MD;  Location: [facility] OR;   Service: Vascular;  Laterality: Right;   CATARACT EXTRACTION     CLIPPING OF ATRIAL APPENDAGE  12/05/2018   Procedure: Clipping Of Atrial Appendage;  Surgeon: Wonda Olds, MD;  Location: [facility] OR;  Service: Open Heart Surgery;;   CORONARY ANGIOPLASTY WITH STENT PLACEMENT     x4   CORONARY ARTERY BYPASS GRAFT N/A 12/05/2018   Procedure: CORONARY ARTERY BYPASS GRAFTING (CABG) X FOUR USING LEFT INTERNAL MAMMARY ARTERY AND RIGHT SAPHENOUS VEIN GRAFTS;  Surgeon: Wonda Olds, MD;  Location: [facility] OR;  Service: Open Heart Surgery;  Laterality: N/A;   HERNIA REPAIR     ventral hernia repair with mesh--revision also was required   INSERT / REPLACE / REMOVE PACEMAKER  05/10/2000   pacemaker implanted in [place]]   IR THORACENTESIS ASP PLEURAL SPACE W/IMG GUIDE  12/21/2018   IR THORACENTESIS ASP PLEURAL SPACE W/IMG GUIDE  12/30/2018   LEFT HEART CATH AND CORONARY ANGIOGRAPHY N/A 11/30/2018   Procedure: LEFT HEART CATH AND CORONARY ANGIOGRAPHY;  Surgeon: Burnell Blanks, MD;  Location: [place] [facility];  Service: Cardiovascular;  Laterality: N/A;   MAZE N/A 12/05/2018   Procedure: MAZE;  Surgeon: Wonda Olds, MD;  Location: [place];  Service: Open  Heart Surgery;  Laterality: N/A;   PACEMAKER GENERATOR CHANGE  09/03/09   MDT Adapta generator change in [place]  2/2   polyp that could not be removed on colonoscopy   PPM GENERATOR CHANGEOUT N/A 06/01/2019   Procedure: PPM GENERATOR CHANGEOUT;  Surgeon: [name], MD;  Location: [place] [facility];  Service: Cardiovascular;  Laterality: N/A;   REVISON OF ARTERIOVENOUS FISTULA Right 01/24/2020   Procedure: BANDING OF RIGHT ARM ARTERIOVENOUS FISTULA;  Surgeon: Serafina Mitchell, MD;  Location: [facility];  Service: Vascular;  Laterality: Right;   TEE WITHOUT CARDIOVERSION N/A 12/05/2018   Procedure: TRANSESOPHAGEAL ECHOCARDIOGRAM (TEE);  Surgeon: Wonda Olds, MD;  Location: [place];  Service: Open Heart Surgery;   Laterality: N/A;    Short Social History:  Social History   Tobacco Use   Smoking status: Former    Types: Cigarettes   Smokeless tobacco: Never   Tobacco comments:    " many years ago"  Substance Use Topics   Alcohol use: No    Allergies  Allergen Reactions   Other Other (See Comments)   Shellfish Allergy Rash    Current Outpatient Medications  Medication Sig Dispense Refill   allopurinol (ZYLOPRIM) 100 MG tablet Take 1 tablet (100 mg total) by mouth daily. 30 tablet 3   aspirin EC 81 MG tablet Take 81 mg by mouth daily.     B Complex-C-Zn-Folic Acid (DIALYVITE 081 WITH ZINC) 0.8 MG TABS Take 1 tablet by mouth at bedtime.     midodrine (PROAMATINE) 5 MG tablet TAKE 1 TABLET BY MOUTH THREE TIMES A WEEK AS DIRECTED ON DIALYSIS DAYS ONLY(T/TH/SAT) TAKE 1 TABLET 30 MINUTES PRIOR TO HD TREATMENT. BRING BOTTLE TO UNIT. MAY TAKE ADDITIONAL 1 TABLET HALFWAY THROUGH HD TREATMENT UNDER DIRECTION OF THE RN FOR SBP <100     NOVOLOG MIX 70/30 FLEXPEN (70-30) 100 UNIT/ML FlexPen Inject 30 Units into the skin daily. 30 unit Am and 30 units PM  3   rosuvastatin (CRESTOR) 20 MG tablet Take 1 tablet (20 mg total) by mouth daily at 6 PM. Please make overdue appt with Dr. [name] before anymore refills. Thank you 2nd attempt 15 tablet 0   rosuvastatin (CRESTOR) 5 MG tablet Take 5 mg by mouth.     sevelamer carbonate (RENVELA) 800 MG tablet Take 800 mg by mouth daily.     Cholecalciferol (VITAMIN D-3) 25 MCG (1000 UT) CAPS Take 3,000 Units by mouth daily.  (Patient not taking: Reported on 04/04/2021)     diphenhydramine-acetaminophen (TYLENOL PM) 25-500 MG TABS tablet Take 0.5 tablets by mouth at bedtime as needed (sleep).  (Patient not taking: Reported on 04/04/2021)     gabapentin (NEURONTIN) 100 MG capsule Take 100 mg by mouth daily. (Patient not taking: Reported on 09/24/2021)     hydrALAZINE (APRESOLINE) 25 MG tablet Take 1 tablet (25 mg total) by mouth 3 (three) times daily. (Patient not taking:  Reported on 04/04/2021) 90 tablet 3   HYDROcodone-acetaminophen (NORCO) 5-325 MG tablet Take 1 tablet by mouth every 6 (six) hours as needed for moderate pain. (Patient not taking: Reported on 04/04/2021) 10 tablet 0   ipratropium (ATROVENT) 0.03 % nasal spray Place 2 sprays into both nostrils every 12 (twelve) hours. (Patient not taking: Reported on 04/04/2021) 30 mL 1   metoprolol tartrate (LOPRESSOR) 25 MG tablet TAKE 1 AND 1/2 TABLETS BY MOUTH TWICE A DAY NEEDS APPT FOR REFILLS (Patient not taking: Reported on 04/04/2021) 90 tablet  0   ondansetron (ZOFRAN) 4 MG tablet Take 1 tablet (4 mg total) by mouth every 6 (six) hours as needed for nausea. (Patient not taking: Reported on 04/04/2021) 30 tablet 1   sevelamer carbonate (RENVELA) 800 MG tablet Take 1 tablet (800 mg total) by mouth 3 (three) times daily with meals. (Patient not taking: Reported on 04/04/2021) 90 tablet 3   No current facility-administered medications for this visit.    Review of Systems  Constitutional:  Constitutional negative. HENT: HENT negative.  Eyes: Eyes negative.  Respiratory: Respiratory negative.  Cardiovascular: Cardiovascular negative.  GI: Gastrointestinal negative.  Musculoskeletal: Musculoskeletal negative.  Skin: Skin negative.  Neurological: Positive for focal weakness and numbness.  Hematologic: Positive for bruises/bleeds easily.  Psychiatric: Psychiatric negative.        Objective:  Objective  Vitals:   09/24/21 0924  BP: 132/76  Pulse: 94  Temp: 98.6 F (37 C)  SpO2: 95%     Physical Exam HENT:     Head: Normocephalic.     Nose: Nose normal.  Eyes:     Pupils: Pupils are equal, round, and reactive to light.  Cardiovascular:     Pulses:          Radial pulses are 0 on the right side and 0 on the left side.       Popliteal pulses are 2+ on the right side and 2+ on the left side.       Dorsalis pedis pulses are 0 on the right side and 2+ on the left side.       Posterior tibial pulses  are 1+ on the right side.  Pulmonary:     Effort: Pulmonary effort is normal.  Abdominal:     General: Abdomen is flat.     Palpations: Abdomen is soft.  Musculoskeletal:     Right lower leg: No edema.     Left lower leg: No edema.  Skin:    Capillary Refill: Capillary refill takes less than 2 seconds.  Neurological:     General: No focal deficit present.     Mental Status: He is alert.  Psychiatric:        Mood and Affect: Mood normal.     Data: ABI Findings:  +---------+------------------+-----+---------+--------+  Right    Rt Pressure (mmHg)IndexWaveform Comment   +---------+------------------+-----+---------+--------+  PTA      254               1.74 triphasic          +---------+------------------+-----+---------+--------+  DP       254               1.74                    +---------+------------------+-----+---------+--------+  Great Toe92                0.63 Abnormal           +---------+------------------+-----+---------+--------+   +---------+------------------+-----+---------+-------+  Left     Lt Pressure (mmHg)IndexWaveform Comment  +---------+------------------+-----+---------+-------+  Brachial 146                                      +---------+------------------+-----+---------+-------+  PTA      254               1.74 triphasic         +---------+------------------+-----+---------+-------+  DP  254               1.74 triphasic         +---------+------------------+-----+---------+-------+  Great Toe102               0.70                   +---------+------------------+-----+---------+-------+   +-------+-----------+-----------+------------+------------+  ABI/TBIToday's ABIToday's TBIPrevious ABIPrevious TBI  +-------+-----------+-----------+------------+------------+  Right  [place]         0.63       [place]          0.77          +-------+-----------+-----------+------------+------------+   Left   [place]         0.70       [place]          0.78          +-------+-----------+-----------+------------+------------+      Right TBI appears decreased.     Summary:  Right: Resting right ankle-brachial index indicates noncompressible right  lower extremity arteries. The right toe-brachial index is abnormal.  Adequate perfusion to lower extremity.   Left: Resting left ankle-brachial index indicates noncompressible left  lower extremity arteries. The left toe-brachial index is normal. Adequate  perfusion to lower extremity.      Assessment/Plan:     81 year old male with numbness and weakness of his bilateral hands and feet.  He does have palpable pulses distally appears to be mostly related to neuropathy and is worsening with dialysis.  I sent Neurontin to his pharmacy given that he does not think he has ever tried this medication.  He does not need any specific follow-up with me but I will be happy to see him on an as-needed basis.    Waynetta Sandy MD [facility]

## 2021-09-25 DIAGNOSIS — N186 End stage renal disease: Secondary | ICD-10-CM | POA: Diagnosis not present

## 2021-09-25 DIAGNOSIS — D509 Iron deficiency anemia, unspecified: Secondary | ICD-10-CM | POA: Diagnosis not present

## 2021-09-25 DIAGNOSIS — Z992 Dependence on renal dialysis: Secondary | ICD-10-CM | POA: Diagnosis not present

## 2021-09-25 DIAGNOSIS — N2581 Secondary hyperparathyroidism of renal origin: Secondary | ICD-10-CM | POA: Diagnosis not present

## 2021-09-25 DIAGNOSIS — E1122 Type 2 diabetes mellitus with diabetic chronic kidney disease: Secondary | ICD-10-CM | POA: Diagnosis not present

## 2021-09-25 DIAGNOSIS — D631 Anemia in chronic kidney disease: Secondary | ICD-10-CM | POA: Diagnosis not present

## 2021-09-27 DIAGNOSIS — D631 Anemia in chronic kidney disease: Secondary | ICD-10-CM | POA: Diagnosis not present

## 2021-09-27 DIAGNOSIS — N2581 Secondary hyperparathyroidism of renal origin: Secondary | ICD-10-CM | POA: Diagnosis not present

## 2021-09-27 DIAGNOSIS — Z992 Dependence on renal dialysis: Secondary | ICD-10-CM | POA: Diagnosis not present

## 2021-09-27 DIAGNOSIS — D509 Iron deficiency anemia, unspecified: Secondary | ICD-10-CM | POA: Diagnosis not present

## 2021-09-27 DIAGNOSIS — E1122 Type 2 diabetes mellitus with diabetic chronic kidney disease: Secondary | ICD-10-CM | POA: Diagnosis not present

## 2021-09-27 DIAGNOSIS — N186 End stage renal disease: Secondary | ICD-10-CM | POA: Diagnosis not present

## 2021-09-30 DIAGNOSIS — Z992 Dependence on renal dialysis: Secondary | ICD-10-CM | POA: Diagnosis not present

## 2021-09-30 DIAGNOSIS — D509 Iron deficiency anemia, unspecified: Secondary | ICD-10-CM | POA: Diagnosis not present

## 2021-09-30 DIAGNOSIS — N186 End stage renal disease: Secondary | ICD-10-CM | POA: Diagnosis not present

## 2021-09-30 DIAGNOSIS — N2581 Secondary hyperparathyroidism of renal origin: Secondary | ICD-10-CM | POA: Diagnosis not present

## 2021-09-30 DIAGNOSIS — E1122 Type 2 diabetes mellitus with diabetic chronic kidney disease: Secondary | ICD-10-CM | POA: Diagnosis not present

## 2021-09-30 DIAGNOSIS — D631 Anemia in chronic kidney disease: Secondary | ICD-10-CM | POA: Diagnosis not present

## 2021-10-02 DIAGNOSIS — N2581 Secondary hyperparathyroidism of renal origin: Secondary | ICD-10-CM | POA: Diagnosis not present

## 2021-10-02 DIAGNOSIS — D631 Anemia in chronic kidney disease: Secondary | ICD-10-CM | POA: Diagnosis not present

## 2021-10-02 DIAGNOSIS — Z992 Dependence on renal dialysis: Secondary | ICD-10-CM | POA: Diagnosis not present

## 2021-10-02 DIAGNOSIS — E1122 Type 2 diabetes mellitus with diabetic chronic kidney disease: Secondary | ICD-10-CM | POA: Diagnosis not present

## 2021-10-02 DIAGNOSIS — N186 End stage renal disease: Secondary | ICD-10-CM | POA: Diagnosis not present

## 2021-10-02 DIAGNOSIS — D509 Iron deficiency anemia, unspecified: Secondary | ICD-10-CM | POA: Diagnosis not present

## 2021-10-04 DIAGNOSIS — E1122 Type 2 diabetes mellitus with diabetic chronic kidney disease: Secondary | ICD-10-CM | POA: Diagnosis not present

## 2021-10-04 DIAGNOSIS — N186 End stage renal disease: Secondary | ICD-10-CM | POA: Diagnosis not present

## 2021-10-04 DIAGNOSIS — N2581 Secondary hyperparathyroidism of renal origin: Secondary | ICD-10-CM | POA: Diagnosis not present

## 2021-10-04 DIAGNOSIS — D631 Anemia in chronic kidney disease: Secondary | ICD-10-CM | POA: Diagnosis not present

## 2021-10-04 DIAGNOSIS — Z992 Dependence on renal dialysis: Secondary | ICD-10-CM | POA: Diagnosis not present

## 2021-10-04 DIAGNOSIS — D509 Iron deficiency anemia, unspecified: Secondary | ICD-10-CM | POA: Diagnosis not present

## 2021-10-07 ENCOUNTER — Encounter: Payer: Self-pay | Admitting: Cardiovascular Disease

## 2021-10-07 DIAGNOSIS — N186 End stage renal disease: Secondary | ICD-10-CM | POA: Diagnosis not present

## 2021-10-07 DIAGNOSIS — N2581 Secondary hyperparathyroidism of renal origin: Secondary | ICD-10-CM | POA: Diagnosis not present

## 2021-10-07 DIAGNOSIS — E1122 Type 2 diabetes mellitus with diabetic chronic kidney disease: Secondary | ICD-10-CM | POA: Diagnosis not present

## 2021-10-07 DIAGNOSIS — D631 Anemia in chronic kidney disease: Secondary | ICD-10-CM | POA: Diagnosis not present

## 2021-10-07 DIAGNOSIS — Z992 Dependence on renal dialysis: Secondary | ICD-10-CM | POA: Diagnosis not present

## 2021-10-07 DIAGNOSIS — D509 Iron deficiency anemia, unspecified: Secondary | ICD-10-CM | POA: Diagnosis not present

## 2021-10-07 NOTE — Progress Notes (Unsigned)
Bradley Soto Date of Birth  [birth year]       [facility]    [employer] [address], [place], [place]  [address]   [place], [place]  [address] [place]   Fax  [phone]     Fax [phone]  Problem List: 1. Coronary artery disease-status post stenting 2. Sick sinus syndrome-status post pacemaker. History of paroxysmal  atrial fib 3. CKD 4. Diabetes Mellitus 5. Gout 6.   Previoius Hx  Pt recently moved from [place], [place] ( near [employer])  Here to establish cardiology care. 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    March 25, 2017: Bradley Soto is seen today for a follow-up visit.  I last saw him 4 years ago.  He has been seeing Dr. Rayann Heman in the meantime.  Hx of stenting in the past  Was just in the hospital - had the flu.  ( did get the flu shot)  Echocardiogram while in the hospital shows normal left ventricular systolic function with ejection fraction of 60-65%.  He has mild left ventricular hypertrophy.  There is akinesis of the inferior basilar segment.  He has grade 1 diastolic.  dysfunction.  There is mild aortic stenosis.  Now has  No CP . Has some shortness of breath ,  Worse in the cold.   Has gained some weight - since being on Insulin   Jan. 29, 2020   Doing well  He describes having some palpitations that lasted about a month.  He thinks they started around mid December and lasted through the first part of January. It is him as needed for these palpitations.  They seem to have resolved.  He was wondering if they are related to his Tylenol PM. Today shows atrial pacing.  There is no arrhythmias. Is not exercising   Oct. 12, 2020   Bradley Soto is seen today for follow up . Hx of previous CAD and previous coronary stenting .   [place] was seen in the ER on Oct. 8 but left the waiting room because of prolonged wait time. Has mid  sternal chest pain . Seems worse with movement,  No radiation.  Did not have NTG to take   CP started a week ago .    Has occurred 3-4 times.  Woke up with CP last night Typically occurs at night while sleeping .  Does have CP while walking to the mailbox   Will last for several minutes.   Center of chest,  Radiates to his throat.  This pain is very similar to his previous episodes of CP  Pain was relieved with SL NTG   Not associated with deep breath , not associated with twisting his body .    December 23, 2018: Bradley Soto is seen today for a follow-up visit.  He was having some chest pain when I saw him last month and we referred him for heart catheterization.  Ramus lesion is 99% stenosed. Prox LAD to Mid LAD lesion is 70% stenosed. Dist Cx lesion is 60% stenosed. Mid RCA lesion is 95% stenosed. 3rd Mrg-1 lesion is 80% stenosed. 3rd Mrg-2 lesion is 99% stenosed. 1st LPL lesion is 99% stenosed.   1. Severe multi-vessel CAD 2. Moderately severe mid LAD stenosis 3. Severe restenosis of the stented segment in the proximal segment of the ramus intermediate branch 4. Severe restenosis  mid Circumflex/obtuse marginal stented segment. Severe stenosis in the first obtuse marginal branch beyond the stented segment. Severe stenosis distal Circumflex leading into the moderate caliber second obtuse marginal branch.  5. Severe stenosis mid RCA   Had coronary artery bypass grafting-LIMA to LAD, SVG to RI  - OM3, SVT to D1 The distal right coronary artery/posterior descending branch was evaluated but was considered to be too small for bypass grafting. Is healing   He has had some recurrent pleural effusions and had a thoracentesis 2 days ago.   1.4 liters   The procedure was done in special procedures/interventional radiology on November 4.  He had some postoperative atrial fibrillation and is now on amiodarone  He was previously on amlodipine 2.5 mg a day, hydralazine 25 mg 3 times a day,  isosorbide 30 mg a day and Eliquis.  These medications have been discontinued.  His simvastatin was discontinued but now he is on Crestor 20 mg a day.  January 25, 2019:  Bradley Soto is seen today for follow-up visit.  He has a history of coronary artery disease and is status post coronary artery bypass grafting ( Oct. 19,. 2020 )   He has a pacemaker. Breathing is going ok Slight DOE ,  No fever,    No angina    Feb. 17, 2023 Bradley Soto is seen today for follow up of his CAD, CABG Is on HD now for the past 1.5 years  occasional has hypotension with HD  Is developing peripheral neuropathy in his feet LDL is 16    Aug. 23, 2023  Bradley Soto is seen today  for CAD/ CABG  Has been on HD     Current Outpatient Medications  Medication Sig Dispense Refill   allopurinol (ZYLOPRIM) 100 MG tablet Take 1 tablet (100 mg total) by mouth daily. 30 tablet 3   aspirin EC 81 MG tablet Take 81 mg by mouth daily.     B Complex-C-Zn-Folic Acid (DIALYVITE 494 WITH ZINC) 0.8 MG TABS Take 1 tablet by mouth at bedtime.     Cholecalciferol (VITAMIN D-3) 25 MCG (1000 UT) CAPS Take 3,000 Units by mouth daily.  (Patient not taking: Reported on 04/04/2021)     diphenhydramine-acetaminophen (TYLENOL PM) 25-500 MG TABS tablet Take 0.5 tablets by mouth at bedtime as needed (sleep).  (Patient not taking: Reported on 04/04/2021)     gabapentin (NEURONTIN) 100 MG capsule Take 100 mg by mouth daily. (Patient not taking: Reported on 09/24/2021)     gabapentin (NEURONTIN) 100 MG capsule Take 1 capsule (100 mg total) by mouth 3 (three) times daily. 90 capsule 3   hydrALAZINE (APRESOLINE) 25 MG tablet Take 1 tablet (25 mg total) by mouth 3 (three) times daily. (Patient not taking: Reported on 04/04/2021) 90 tablet 3   HYDROcodone-acetaminophen (NORCO) 5-325 MG tablet Take 1 tablet by mouth every 6 (six) hours as needed for moderate pain. (Patient not taking: Reported on 04/04/2021) 10 tablet 0   ipratropium (ATROVENT) 0.03 % nasal spray  Place 2 sprays into both nostrils every 12 (twelve) hours. (Patient not taking: Reported on 04/04/2021) 30 mL 1   metoprolol tartrate (LOPRESSOR) 25 MG tablet TAKE 1 AND 1/2 TABLETS BY MOUTH TWICE A DAY NEEDS APPT FOR REFILLS (Patient not taking: Reported on 04/04/2021) 90 tablet 0   midodrine (PROAMATINE) 5 MG tablet TAKE 1 TABLET BY MOUTH THREE TIMES A WEEK AS DIRECTED ON DIALYSIS DAYS ONLY(T/TH/SAT) TAKE 1 TABLET 30 MINUTES PRIOR TO HD TREATMENT. BRING BOTTLE TO UNIT. MAY  TAKE ADDITIONAL 1 TABLET HALFWAY THROUGH HD TREATMENT UNDER DIRECTION OF THE RN FOR SBP <100     NOVOLOG MIX 70/30 FLEXPEN (70-30) 100 UNIT/ML FlexPen Inject 30 Units into the skin daily. 30 unit Am and 30 units PM  3   ondansetron (ZOFRAN) 4 MG tablet Take 1 tablet (4 mg total) by mouth every 6 (six) hours as needed for nausea. (Patient not taking: Reported on 04/04/2021) 30 tablet 1   rosuvastatin (CRESTOR) 20 MG tablet Take 1 tablet (20 mg total) by mouth daily at 6 PM. Please make overdue appt with Dr. [name] before anymore refills. Thank you 2nd attempt 15 tablet 0   rosuvastatin (CRESTOR) 5 MG tablet Take 5 mg by mouth.     sevelamer carbonate (RENVELA) 800 MG tablet Take 1 tablet (800 mg total) by mouth 3 (three) times daily with meals. (Patient not taking: Reported on 04/04/2021) 90 tablet 3   sevelamer carbonate (RENVELA) 800 MG tablet Take 800 mg by mouth daily.     No current facility-administered medications for this visit.     Allergies  Allergen Reactions   Other Other (See Comments)   Shellfish Allergy Rash    Past Medical History:  Diagnosis Date   Anxiety    Bell's palsy    left side of face droops   CAD (coronary artery disease)    s/p stent placement   CKD (chronic kidney disease), stage III (HCC)    [place]- [facility]- [facility]   Colon polyps    Diabetes mellitus without complication (HCC)    Gout    History of blood transfusion    History of kidney stones    Passes   HLD (hyperlipidemia)     HTN (hypertension)    Insomnia    MI (myocardial infarction) ([place]) 1998   NSTEMI (non-ST elevated myocardial infarction) ([place])    s/p PCI in early 2000s   OSA on CPAP    wears CPAP   Presence of permanent cardiac pacemaker    SSS (sick sinus syndrome) (HCC)    Type II or unspecified type diabetes mellitus without mention of complication, not stated as uncontrolled    Wears glasses    reading    Wears partial dentures     Past Surgical History:  Procedure Laterality Date   APPENDECTOMY     AV FISTULA PLACEMENT Right 07/28/2019   Procedure: RIGHT ARM Brachiocephalic ARTERIOVENOUS (AV) FISTULA CREATION;  Surgeon: Serafina Mitchell, MD;  Location: [facility] OR;  Service: Vascular;  Laterality: Right;   CATARACT EXTRACTION     CLIPPING OF ATRIAL APPENDAGE  12/05/2018   Procedure: Clipping Of Atrial Appendage;  Surgeon: Wonda Olds, MD;  Location: [facility] OR;  Service: Open Heart Surgery;;   CORONARY ANGIOPLASTY WITH STENT PLACEMENT     x4   CORONARY ARTERY BYPASS GRAFT N/A 12/05/2018   Procedure: CORONARY ARTERY BYPASS GRAFTING (CABG) X FOUR USING LEFT INTERNAL MAMMARY ARTERY AND RIGHT SAPHENOUS VEIN GRAFTS;  Surgeon: Wonda Olds, MD;  Location: [facility] OR;  Service: Open Heart Surgery;  Laterality: N/A;   HERNIA REPAIR     ventral hernia repair with mesh--revision also was required   INSERT / REPLACE / REMOVE PACEMAKER  05/10/2000   pacemaker implanted in [place]]   IR THORACENTESIS ASP PLEURAL SPACE W/IMG GUIDE  12/21/2018   IR THORACENTESIS ASP PLEURAL SPACE W/IMG GUIDE  12/30/2018   LEFT HEART CATH AND CORONARY ANGIOGRAPHY N/A 11/30/2018   Procedure: LEFT HEART CATH AND CORONARY ANGIOGRAPHY;  Surgeon: Burnell Blanks, MD;  Location: [facility];  Service: Cardiovascular;  Laterality: N/A;   MAZE N/A 12/05/2018   Procedure: MAZE;  Surgeon: Wonda Olds, MD;  Location: [facility];  Service: Open Heart Surgery;  Laterality: N/A;   PACEMAKER GENERATOR CHANGE  09/03/09   MDT  Adapta generator change in [place]  2/2   polyp that could not be removed on colonoscopy   PPM GENERATOR CHANGEOUT N/A 06/01/2019   Procedure: PPM GENERATOR CHANGEOUT;  Surgeon: [name], MD;  Location: [facility];  Service: Cardiovascular;  Laterality: N/A;   REVISON OF ARTERIOVENOUS FISTULA Right 01/24/2020   Procedure: BANDING OF RIGHT ARM ARTERIOVENOUS FISTULA;  Surgeon: Serafina Mitchell, MD;  Location: [facility];  Service: Vascular;  Laterality: Right;   TEE WITHOUT CARDIOVERSION N/A 12/05/2018   Procedure: TRANSESOPHAGEAL ECHOCARDIOGRAM (TEE);  Surgeon: Wonda Olds, MD;  Location: [facility];  Service: Open Heart Surgery;  Laterality: N/A;    Social History   Tobacco Use  Smoking Status Former   Types: Cigarettes  Smokeless Tobacco Never  Tobacco Comments   " many years ago"    Social History   Substance and Sexual Activity  Alcohol Use No    Family History  Problem Relation Age of Onset   Heart attack Father 49   Heart disease Father    Breast cancer Sister    Pulmonary embolism Sister    Deep vein thrombosis Sister     Reviw of Systems:  Reviewed in the HPI.  All other systems are negative.  Physical Exam: There were no vitals taken for this visit.  No BP recorded.  {Refresh Note OR Click here to enter BP  :1}***    GEN:  Well nourished, well developed in no acute distress HEENT: Normal NECK: No JVD; No carotid bruits LYMPHATICS: No lymphadenopathy CARDIAC: RRR ***, no murmurs, rubs, gallops RESPIRATORY:  Clear to auscultation without rales, wheezing or rhonchi  ABDOMEN: Soft, non-tender, non-distended MUSCULOSKELETAL:  No edema; No deformity  SKIN: Warm and dry NEUROLOGIC:  Alert and oriented x 3    ECG:    Assessment / Plan:   1.  Coronary artery disease: Had a heart catheterization which revealed severe three-vessel coronary artery disease.  He had coronary artery bypass grafting to his LAD, circumflex artery,  diagonal vessel.  The right coronary artery was too small to consider bypassing.   2.  Chronic diastolic congestive heart failure: :    3.  Hypertension:        4.  Chronic renal insufficiency: Followed by nephrology.  5.  Paroxysmal atrial fibrillation:   remains in NSR today        Mertie Moores, MD  10/07/2021 9:26 PM    [address],  [place], [place]  [address] Pager [phone] Phone: [phone]; Fax: [phone]   .

## 2021-10-08 ENCOUNTER — Encounter: Payer: Self-pay | Admitting: Cardiovascular Disease

## 2021-10-08 ENCOUNTER — Ambulatory Visit (INDEPENDENT_AMBULATORY_CARE_PROVIDER_SITE_OTHER): Payer: Medicare Other | Admitting: Cardiovascular Disease

## 2021-10-08 VITALS — BP 126/62 | HR 88 | Ht 71.0 in | Wt 220.8 lb

## 2021-10-08 DIAGNOSIS — R072 Precordial pain: Secondary | ICD-10-CM

## 2021-10-08 DIAGNOSIS — I2510 Atherosclerotic heart disease of native coronary artery without angina pectoris: Secondary | ICD-10-CM | POA: Diagnosis not present

## 2021-10-08 MED ORDER — NITROGLYCERIN 0.4 MG SL SUBL: 0.4000 mg | SUBLINGUAL_TABLET | SUBLINGUAL | 3 refills | Status: DC | PRN

## 2021-10-08 MED ORDER — NITROGLYCERIN 0.4 MG SL SUBL
0.4000 mg | SUBLINGUAL_TABLET | SUBLINGUAL | 3 refills | Status: DC | PRN
Start: 2021-10-08 — End: 2021-10-08

## 2021-10-08 MED ORDER — NITROGLYCERIN 0.4 MG SL SUBL
0.4000 mg | SUBLINGUAL_TABLET | SUBLINGUAL | 3 refills | Status: DC | PRN
Start: 2021-10-08 — End: 2022-05-22

## 2021-10-08 NOTE — Patient Instructions (Signed)
Medication Instructions:  Your physician recommends that you continue on your current medications as directed. Please refer to the Current Medication list given to you today.] *If you need a refill on your cardiac medications before your next appointment, please call your pharmacy*  Testing/Procedures: Your physician has requested that you have a carotid duplex. This test is an ultrasound of the carotid arteries in your neck. It looks at blood flow through these arteries that supply the brain with blood. Allow one hour for this exam. There are no restrictions or special instructions.   Follow-Up: At [facility], you and your health needs are our priority.  As part of our continuing mission to provide you with exceptional heart care, we have created designated Provider Care Teams.  These Care Teams include your primary Cardiologist (physician) and Advanced Practice Providers (APPs -  Physician Assistants and Nurse Practitioners) who all work together to provide you with the care you need, when you need it.  Your next appointment:   3 month(s)  The format for your next appointment:   In Person  Provider:   Mertie Moores, MD    Other Instructions How to Prepare for Your Cardiac PET/CT Stress Test:  1. Please do not take these medications before your test:   Medications that may interfere with the cardiac pharmacological stress agent (ex. nitrates - including erectile dysfunction medications or beta-blockers) the day of the exam. (Erectile dysfunction medication should be held for at least 72 hrs prior to test) Theophylline containing medications for 12 hours. Dipyridamole 48 hours prior to the test. Your remaining medications may be taken with water.  2. Nothing to eat or drink, except water, 3 hours prior to arrival time.   NO caffeine/decaffeinated products, or chocolate 12 hours prior to arrival.  3. NO perfume, cologne or lotion  4. Total time is 1 to 2 hours; you may want to  bring reading material for the waiting time.  5. Please report to Admitting at the [facility] Main Entrance 60 minutes early for your test.  [place], [place] [address]  Diabetic Preparation:  Hold oral medications. You may take NPH and Lantus insulin. Do not take Humalog or Humulin R (Regular Insulin) the day of your test. Check blood sugars prior to leaving the house. If able to eat breakfast prior to 3 hour fasting, you may take all medications, including your insulin, Do not worry if you miss your breakfast dose of insulin - start at your next meal.  IF YOU THINK YOU MAY BE PREGNANT, OR ARE NURSING PLEASE INFORM THE TECHNOLOGIST.  In preparation for your appointment, medication and supplies will be purchased.  Appointment availability is limited, so if you need to cancel or reschedule, please call the Radiology Department at [phone]  24 hours in advance to avoid a cancellation fee of $100.00  What to Expect After you Arrive:  Once you arrive and check in for your appointment, you will be taken to a preparation room within the Radiology Department.  A technologist or Nurse will obtain your medical history, verify that you are correctly prepped for the exam, and explain the procedure.  Afterwards,  an IV will be started in your arm and electrodes will be placed on your skin for EKG monitoring during the stress portion of the exam. Then you will be escorted to the PET/CT scanner.  There, staff will get you positioned on the scanner and obtain a blood pressure and EKG.  During  the exam, you will continue to be connected to the EKG and blood pressure machines.  A small, safe amount of a radioactive tracer will be injected in your IV to obtain a series of pictures of your heart along with an injection of a stress agent.    After your Exam:  It is recommended that you eat a meal and drink a caffeinated beverage to counter act any effects of the stress agent.   Drink plenty of fluids for the remainder of the day and urinate frequently for the first couple of hours after the exam.  Your doctor will inform you of your test results within 7-10 business days.  For questions about your test or how to prepare for your test, please call: Marchia Bond, Cardiac Imaging Nurse Navigator  Gordy Clement, Cardiac Imaging Nurse Navigator Office: [phone]

## 2021-10-09 DIAGNOSIS — N186 End stage renal disease: Secondary | ICD-10-CM | POA: Diagnosis not present

## 2021-10-09 DIAGNOSIS — E1122 Type 2 diabetes mellitus with diabetic chronic kidney disease: Secondary | ICD-10-CM | POA: Diagnosis not present

## 2021-10-09 DIAGNOSIS — Z992 Dependence on renal dialysis: Secondary | ICD-10-CM | POA: Diagnosis not present

## 2021-10-09 DIAGNOSIS — D509 Iron deficiency anemia, unspecified: Secondary | ICD-10-CM | POA: Diagnosis not present

## 2021-10-09 DIAGNOSIS — D631 Anemia in chronic kidney disease: Secondary | ICD-10-CM | POA: Diagnosis not present

## 2021-10-09 DIAGNOSIS — N2581 Secondary hyperparathyroidism of renal origin: Secondary | ICD-10-CM | POA: Diagnosis not present

## 2021-10-11 DIAGNOSIS — N2581 Secondary hyperparathyroidism of renal origin: Secondary | ICD-10-CM | POA: Diagnosis not present

## 2021-10-11 DIAGNOSIS — Z992 Dependence on renal dialysis: Secondary | ICD-10-CM | POA: Diagnosis not present

## 2021-10-11 DIAGNOSIS — N186 End stage renal disease: Secondary | ICD-10-CM | POA: Diagnosis not present

## 2021-10-11 DIAGNOSIS — E1122 Type 2 diabetes mellitus with diabetic chronic kidney disease: Secondary | ICD-10-CM | POA: Diagnosis not present

## 2021-10-11 DIAGNOSIS — D631 Anemia in chronic kidney disease: Secondary | ICD-10-CM | POA: Diagnosis not present

## 2021-10-11 DIAGNOSIS — D509 Iron deficiency anemia, unspecified: Secondary | ICD-10-CM | POA: Diagnosis not present

## 2021-10-13 ENCOUNTER — Ambulatory Visit (HOSPITAL_COMMUNITY)
Admission: RE | Admit: 2021-10-13 | Discharge: 2021-10-13 | Disposition: A | Discharge status: Home / Self Care | Payer: Medicare Other | Source: Ambulatory Visit | Attending: Cardiology | Admitting: Cardiology

## 2021-10-13 DIAGNOSIS — I2510 Atherosclerotic heart disease of native coronary artery without angina pectoris: Secondary | ICD-10-CM | POA: Insufficient documentation

## 2021-10-13 DIAGNOSIS — R072 Precordial pain: Secondary | ICD-10-CM | POA: Insufficient documentation

## 2021-10-13 DIAGNOSIS — I6523 Occlusion and stenosis of bilateral carotid arteries: Secondary | ICD-10-CM

## 2021-10-14 DIAGNOSIS — E1122 Type 2 diabetes mellitus with diabetic chronic kidney disease: Secondary | ICD-10-CM | POA: Diagnosis not present

## 2021-10-14 DIAGNOSIS — D631 Anemia in chronic kidney disease: Secondary | ICD-10-CM | POA: Diagnosis not present

## 2021-10-14 DIAGNOSIS — Z992 Dependence on renal dialysis: Secondary | ICD-10-CM | POA: Diagnosis not present

## 2021-10-14 DIAGNOSIS — D509 Iron deficiency anemia, unspecified: Secondary | ICD-10-CM | POA: Diagnosis not present

## 2021-10-14 DIAGNOSIS — N2581 Secondary hyperparathyroidism of renal origin: Secondary | ICD-10-CM | POA: Diagnosis not present

## 2021-10-14 DIAGNOSIS — N186 End stage renal disease: Secondary | ICD-10-CM | POA: Diagnosis not present

## 2021-10-15 ENCOUNTER — Telehealth: Payer: Self-pay

## 2021-10-15 DIAGNOSIS — I6523 Occlusion and stenosis of bilateral carotid arteries: Secondary | ICD-10-CM

## 2021-10-15 NOTE — Telephone Encounter (Signed)
Order placed for repeat study in one year. Pt made aware of results.

## 2021-10-15 NOTE — Telephone Encounter (Signed)
-----   Message from Thayer Headings, MD sent at 10/13/2021  5:19 PM EDT ----- Yes, repeat carotid duplex in 1 year  Thanks  PN  ----- Message ----- From: Ma Hillock Sent: 10/13/2021   2:37 PM EDT To: Thayer Headings, MD  Per your OV note on 8/23: Had moderate carotid stenosis of R ICA mild stenosis of L ICA Will recheck cardotid duplex.   So I'm assuming the L ICA is still mild, and no change in right side.. Repeat study in 1 year? ----- Message ----- From: Thayer Headings, MD Sent: 10/13/2021  12:19 PM EDT To: Donnalee Curry K  Mild carotid artery disease.

## 2021-10-16 DIAGNOSIS — E1122 Type 2 diabetes mellitus with diabetic chronic kidney disease: Secondary | ICD-10-CM | POA: Diagnosis not present

## 2021-10-16 DIAGNOSIS — N2581 Secondary hyperparathyroidism of renal origin: Secondary | ICD-10-CM | POA: Diagnosis not present

## 2021-10-16 DIAGNOSIS — D509 Iron deficiency anemia, unspecified: Secondary | ICD-10-CM | POA: Diagnosis not present

## 2021-10-16 DIAGNOSIS — N186 End stage renal disease: Secondary | ICD-10-CM | POA: Diagnosis not present

## 2021-10-16 DIAGNOSIS — D631 Anemia in chronic kidney disease: Secondary | ICD-10-CM | POA: Diagnosis not present

## 2021-10-16 DIAGNOSIS — Z992 Dependence on renal dialysis: Secondary | ICD-10-CM | POA: Diagnosis not present

## 2021-10-17 DIAGNOSIS — I129 Hypertensive chronic kidney disease with stage 1 through stage 4 chronic kidney disease, or unspecified chronic kidney disease: Secondary | ICD-10-CM | POA: Diagnosis not present

## 2021-10-17 DIAGNOSIS — N186 End stage renal disease: Secondary | ICD-10-CM | POA: Diagnosis not present

## 2021-10-17 DIAGNOSIS — Z992 Dependence on renal dialysis: Secondary | ICD-10-CM | POA: Diagnosis not present

## 2021-10-18 DIAGNOSIS — D631 Anemia in chronic kidney disease: Secondary | ICD-10-CM | POA: Diagnosis not present

## 2021-10-18 DIAGNOSIS — Z992 Dependence on renal dialysis: Secondary | ICD-10-CM | POA: Diagnosis not present

## 2021-10-18 DIAGNOSIS — E1122 Type 2 diabetes mellitus with diabetic chronic kidney disease: Secondary | ICD-10-CM | POA: Diagnosis not present

## 2021-10-18 DIAGNOSIS — N186 End stage renal disease: Secondary | ICD-10-CM | POA: Diagnosis not present

## 2021-10-18 DIAGNOSIS — N2581 Secondary hyperparathyroidism of renal origin: Secondary | ICD-10-CM | POA: Diagnosis not present

## 2021-10-18 DIAGNOSIS — D509 Iron deficiency anemia, unspecified: Secondary | ICD-10-CM | POA: Diagnosis not present

## 2021-10-21 DIAGNOSIS — Z992 Dependence on renal dialysis: Secondary | ICD-10-CM | POA: Diagnosis not present

## 2021-10-21 DIAGNOSIS — N2581 Secondary hyperparathyroidism of renal origin: Secondary | ICD-10-CM | POA: Diagnosis not present

## 2021-10-21 DIAGNOSIS — D509 Iron deficiency anemia, unspecified: Secondary | ICD-10-CM | POA: Diagnosis not present

## 2021-10-21 DIAGNOSIS — D631 Anemia in chronic kidney disease: Secondary | ICD-10-CM | POA: Diagnosis not present

## 2021-10-21 DIAGNOSIS — N186 End stage renal disease: Secondary | ICD-10-CM | POA: Diagnosis not present

## 2021-10-21 DIAGNOSIS — E1122 Type 2 diabetes mellitus with diabetic chronic kidney disease: Secondary | ICD-10-CM | POA: Diagnosis not present

## 2021-10-22 ENCOUNTER — Ambulatory Visit: Payer: Medicare Other | Attending: Cardiovascular Disease | Admitting: Cardiovascular Disease

## 2021-10-22 ENCOUNTER — Encounter: Payer: Self-pay | Admitting: Cardiovascular Disease

## 2021-10-22 VITALS — BP 110/58 | HR 92 | Ht 71.0 in | Wt 217.0 lb

## 2021-10-22 DIAGNOSIS — D509 Iron deficiency anemia, unspecified: Secondary | ICD-10-CM | POA: Insufficient documentation

## 2021-10-22 DIAGNOSIS — I2510 Atherosclerotic heart disease of native coronary artery without angina pectoris: Secondary | ICD-10-CM | POA: Insufficient documentation

## 2021-10-22 DIAGNOSIS — D649 Anemia, unspecified: Secondary | ICD-10-CM | POA: Insufficient documentation

## 2021-10-22 DIAGNOSIS — I200 Unstable angina: Secondary | ICD-10-CM | POA: Diagnosis not present

## 2021-10-22 DIAGNOSIS — I10 Essential (primary) hypertension: Secondary | ICD-10-CM | POA: Insufficient documentation

## 2021-10-22 NOTE — Progress Notes (Signed)
Bradley Soto Date of Birth  [birth year]       [facility]    [employer] [address], [place], [place]  [address]   [place], [place]  [address] [place]   Fax  [phone]     Fax [phone]  Problem List: 1. Coronary artery disease-status post stenting 2. Sick sinus syndrome-status post pacemaker. History of paroxysmal  atrial fib 3. CKD 4. Diabetes Mellitus 5. Gout 6.   Previoius Hx  Pt recently moved from [place], [place] ( near [employer])  Here to establish cardiology care. 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    March 25, 2017: Bradley Soto is seen today for a follow-up visit.  I last saw him 4 years ago.  He has been seeing Dr. Rayann Heman in the meantime.  Hx of stenting in the past  Was just in the hospital - had the flu.  ( did get the flu shot)  Echocardiogram while in the hospital shows normal left ventricular systolic function with ejection fraction of 60-65%.  He has mild left ventricular hypertrophy.  There is akinesis of the inferior basilar segment.  He has grade 1 diastolic.  dysfunction.  There is mild aortic stenosis.  Now has  No CP . Has some shortness of breath ,  Worse in the cold.   Has gained some weight - since being on Insulin   Jan. 29, 2020   Doing well  He describes having some palpitations that lasted about a month.  He thinks they started around mid December and lasted through the first part of January. It is him as needed for these palpitations.  They seem to have resolved.  He was wondering if they are related to his Tylenol PM. Today shows atrial pacing.  There is no arrhythmias. Is not exercising   Oct. 12, 2020   Bradley Soto is seen today for follow up . Hx of previous CAD and previous coronary stenting .   Juma was seen in the ER on Oct. 8 but left the waiting room because of prolonged wait time. Has mid  sternal chest pain . Seems worse with movement,  No radiation.  Did not have NTG to take   CP started a week ago .    Has occurred 3-4 times.  Woke up with CP last night Typically occurs at night while sleeping .  Does have CP while walking to the mailbox   Will last for several minutes.   Center of chest,  Radiates to his throat.  This pain is very similar to his previous episodes of CP  Pain was relieved with SL NTG   Not associated with deep breath , not associated with twisting his body .    December 23, 2018: Bradley Soto is seen today for a follow-up visit.  He was having some chest pain when I saw him last month and we referred him for heart catheterization.  Ramus lesion is 99% stenosed. Prox LAD to Mid LAD lesion is 70% stenosed. Dist Cx lesion is 60% stenosed. Mid RCA lesion is 95% stenosed. 3rd Mrg-1 lesion is 80% stenosed. 3rd Mrg-2 lesion is 99% stenosed. 1st LPL lesion is 99% stenosed.   1. Severe multi-vessel CAD 2. Moderately severe mid LAD stenosis 3. Severe restenosis of the stented segment in the proximal segment of the ramus intermediate branch 4. Severe restenosis  mid Circumflex/obtuse marginal stented segment. Severe stenosis in the first obtuse marginal branch beyond the stented segment. Severe stenosis distal Circumflex leading into the moderate caliber second obtuse marginal branch.  5. Severe stenosis mid RCA   Had coronary artery bypass grafting-LIMA to LAD, SVG to RI  - OM3, SVG to D1 Oct. 19, 2020  The distal right coronary artery/posterior descending branch was evaluated but was considered to be too small for bypass grafting. Is healing   He has had some recurrent pleural effusions and had a thoracentesis 2 days ago.   1.4 liters   The procedure was done in special procedures/interventional radiology on November 4.  He had some postoperative atrial fibrillation and is now on amiodarone  He was previously on amlodipine 2.5 mg a day, hydralazine 25 mg 3  times a day, isosorbide 30 mg a day and Eliquis.  These medications have been discontinued.  His simvastatin was discontinued but now he is on Crestor 20 mg a day.  January 25, 2019:  Bradley Soto is seen today for follow-up visit.  He has a history of coronary artery disease and is status post coronary artery bypass grafting ( Oct. 19,. 2020 )   He has a pacemaker. Breathing is going ok Slight DOE ,  No fever,    No angina    Feb. 17, 2023 Bradley Soto is seen today for follow up of his CAD, CABG Is on HD now for the past 1.5 years  occasional has hypotension with HD  Is developing peripheral neuropathy in his feet LDL is 16    Aug. 23, 2023  Bradley Soto is seen today  for CAD/ CABG  Has been on HD Having some CP / low level pain  with walking  Similar to his previous angina   He has chest discomfort/pressure walking from his car into the office.  We will schedule him for a cardiac PET scan. We will refer him for heart catheterization if the cardiac PET scan revealed significant ischemia.  Had moderate carotid stenosis of R ICA mild stenosis of L ICA  Will recheck cardotid duplex.   Sept. 6, 2023  Bradley Soto is seen today for recurrent CP / dyspnea  Has been getting dialysis for 2 years Is very anemic .  Hb is 7.5  Is getting iron , epo .   Takes midodrine for dialysis related hypotension  - doesn't work very well according   The plan is for him to get a transfusion - possibly at dialysis  If he is still symptomatic after getting his Hb up, then he will need a cardiac cath    Current Outpatient Medications  Medication Sig Dispense Refill   allopurinol (ZYLOPRIM) 100 MG tablet Take 1 tablet (100 mg total) by mouth daily. 30 tablet 3   aspirin EC 81 MG tablet Take 81 mg by mouth daily.     B Complex-C-Zn-Folic Acid (DIALYVITE 703 WITH ZINC) 0.8 MG TABS Take 1 tablet by mouth at bedtime.     gabapentin (NEURONTIN) 100 MG capsule Take 1 capsule (100 mg total) by mouth 3 (three) times daily. 90  capsule 3   HYDROcodone-acetaminophen (NORCO) 5-325 MG tablet Take 1 tablet by mouth every 6 (six) hours as needed for moderate pain. 10 tablet 0   iron sucrose in sodium chloride 0.9 % 100 mL once a week.     Methoxy PEG-Epoetin Beta (MIRCERA IJ) Every two weeks at dialysis     midodrine (PROAMATINE) 5 MG tablet TAKE 1 TABLET BY  MOUTH THREE TIMES A WEEK AS DIRECTED ON DIALYSIS DAYS ONLY(T/TH/SAT) TAKE 1 TABLET 30 MINUTES PRIOR TO HD TREATMENT. BRING BOTTLE TO UNIT. MAY TAKE ADDITIONAL 1 TABLET HALFWAY THROUGH HD TREATMENT UNDER DIRECTION OF THE RN FOR SBP <100     nitroGLYCERIN (NITROSTAT) 0.4 MG SL tablet Place 1 tablet (0.4 mg total) under the tongue every 5 (five) minutes as needed for chest pain. 25 tablet 3   NOVOLOG MIX 70/30 FLEXPEN (70-30) 100 UNIT/ML FlexPen Inject 30 Units into the skin daily. 30 unit Am and 30 units PM  3   ondansetron (ZOFRAN) 4 MG tablet Take 1 tablet (4 mg total) by mouth every 6 (six) hours as needed for nausea. 30 tablet 1   rosuvastatin (CRESTOR) 5 MG tablet Take 5 mg by mouth.     sevelamer carbonate (RENVELA) 800 MG tablet Take 1 tablet (800 mg total) by mouth 3 (three) times daily with meals. 90 tablet 3   No current facility-administered medications for this visit.     Allergies  Allergen Reactions   Other Other (See Comments)   Shellfish Allergy Rash    Past Medical History:  Diagnosis Date   Anxiety    Bell's palsy    left side of face droops   CAD (coronary artery disease)    s/p stent placement   CKD (chronic kidney disease), stage III (HCC)    [place]- [facility]- [facility]   Colon polyps    Diabetes mellitus without complication (HCC)    Gout    History of blood transfusion    History of kidney stones    Passes   HLD (hyperlipidemia)    HTN (hypertension)    Insomnia    MI (myocardial infarction) ([place]) 1998   NSTEMI (non-ST elevated myocardial infarction) ([place])    s/p PCI in early 2000s   OSA on CPAP    wears CPAP   Presence of  permanent cardiac pacemaker    SSS (sick sinus syndrome) (HCC)    Type II or unspecified type diabetes mellitus without mention of complication, not stated as uncontrolled    Wears glasses    reading    Wears partial dentures     Past Surgical History:  Procedure Laterality Date   APPENDECTOMY     AV FISTULA PLACEMENT Right 07/28/2019   Procedure: RIGHT ARM Brachiocephalic ARTERIOVENOUS (AV) FISTULA CREATION;  Surgeon: Serafina Mitchell, MD;  Location: [facility] OR;  Service: Vascular;  Laterality: Right;   CATARACT EXTRACTION     CLIPPING OF ATRIAL APPENDAGE  12/05/2018   Procedure: Clipping Of Atrial Appendage;  Surgeon: Wonda Olds, MD;  Location: [facility] OR;  Service: Open Heart Surgery;;   CORONARY ANGIOPLASTY WITH STENT PLACEMENT     x4   CORONARY ARTERY BYPASS GRAFT N/A 12/05/2018   Procedure: CORONARY ARTERY BYPASS GRAFTING (CABG) X FOUR USING LEFT INTERNAL MAMMARY ARTERY AND RIGHT SAPHENOUS VEIN GRAFTS;  Surgeon: Wonda Olds, MD;  Location: [facility] OR;  Service: Open Heart Surgery;  Laterality: N/A;   HERNIA REPAIR     ventral hernia repair with mesh--revision also was required   INSERT / REPLACE / REMOVE PACEMAKER  05/10/2000   pacemaker implanted in [place]]   IR THORACENTESIS ASP PLEURAL SPACE W/IMG GUIDE  12/21/2018   IR THORACENTESIS ASP PLEURAL SPACE W/IMG GUIDE  12/30/2018   LEFT HEART CATH AND CORONARY ANGIOGRAPHY N/A 11/30/2018   Procedure: LEFT HEART CATH AND CORONARY ANGIOGRAPHY;  Surgeon: Burnell Blanks, MD;  Location: [facility];  Service: Cardiovascular;  Laterality: N/A;   MAZE N/A 12/05/2018   Procedure: MAZE;  Surgeon: Wonda Olds, MD;  Location: [facility];  Service: Open Heart Surgery;  Laterality: N/A;   PACEMAKER GENERATOR CHANGE  09/03/09   MDT Adapta generator change in [place]  2/2   polyp that could not be removed on colonoscopy   PPM GENERATOR CHANGEOUT N/A 06/01/2019   Procedure: PPM GENERATOR CHANGEOUT;  Surgeon:  [name], MD;  Location: [facility];  Service: Cardiovascular;  Laterality: N/A;   REVISON OF ARTERIOVENOUS FISTULA Right 01/24/2020   Procedure: BANDING OF RIGHT ARM ARTERIOVENOUS FISTULA;  Surgeon: Serafina Mitchell, MD;  Location: [facility];  Service: Vascular;  Laterality: Right;   TEE WITHOUT CARDIOVERSION N/A 12/05/2018   Procedure: TRANSESOPHAGEAL ECHOCARDIOGRAM (TEE);  Surgeon: Wonda Olds, MD;  Location: [facility];  Service: Open Heart Surgery;  Laterality: N/A;    Social History   Tobacco Use  Smoking Status Former   Types: Cigarettes  Smokeless Tobacco Never  Tobacco Comments   " many years ago"    Social History   Substance and Sexual Activity  Alcohol Use No    Family History  Problem Relation Age of Onset   Heart attack Father 66   Heart disease Father    Breast cancer Sister    Pulmonary embolism Sister    Deep vein thrombosis Sister     Reviw of Systems:  Reviewed in the HPI.  All other systems are negative.  Physical Exam: Blood pressure (!) 110/58, pulse 92, height '5\' 11"'$  (1.803 m), weight 217 lb (98.4 kg).     GEN: Chronically ill-appearing gentleman, HEENT: Normal NECK: No JVD; No carotid bruits LYMPHATICS: No lymphadenopathy CARDIAC: RRR soft systolic murmur RESPIRATORY:  Clear to auscultation without rales, wheezing or rhonchi  ABDOMEN: Soft, non-tender, non-distended MUSCULOSKELETAL:  No edema; No deformity  SKIN: Slight gray appearance. NEUROLOGIC:  Alert and oriented x 3     ECG:   Assessment / Plan:   1.  Coronary artery disease: Had a heart catheterization which revealed severe three-vessel coronary artery disease.  He had coronary artery bypass grafting to his LAD, circumflex artery, diagonal vessel.  The right coronary artery was too small to consider bypassing.  He is still very weak ,  has chest pain / tightness   Plan is for him to get a transfusion in the next week or so.  My office that this will alleviate his  symptoms of unstable angina.  If he continues to have symptoms of angina despite having an adequate hemoglobin, then we will need to refer him for heart catheterization.    2.  Chronic diastolic congestive heart failure: :  Continues to have severe DPE   3.  Hypertension:        4.  ESRD :      5.  Paroxysmal atrial fibrillation:    not on anticoagulation  Has anemia with a steady decline in his Hb.    6.  Carotid artery disease:     Mertie Moores, MD  10/22/2021 2:18 PM    [facility] [address],  [place], [place]  [address] Pager [phone] Phone: [phone]; Fax: [phone]) [phone]   .

## 2021-10-22 NOTE — Patient Instructions (Signed)
Medication Instructions:  Your physician recommends that you continue on your current medications as directed. Please refer to the Current Medication list given to you today.  *If you need a refill on your cardiac medications before your next appointment, please call your pharmacy*   Lab Work: NONE If you have labs (blood work) drawn today and your tests are completely normal, you will receive your results only by: [place] (if you have MyChart) OR A paper copy in the mail If you have any lab test that is abnormal or we need to change your treatment, we will call you to review the results.   Testing/Procedures: NONE   Follow-Up: At [facility], you and your health needs are our priority.  As part of our continuing mission to provide you with exceptional heart care, we have created designated Provider Care Teams.  These Care Teams include your primary Cardiologist (physician) and Advanced Practice Providers (APPs -  Physician Assistants and Nurse Practitioners) who all work together to provide you with the care you need, when you need it.  We recommend signing up for the patient portal called "MyChart".  Sign up information is provided on this After Visit Summary.  MyChart is used to connect with patients for Virtual Visits (Telemedicine).  Patients are able to view lab/test results, encounter notes, upcoming appointments, etc.  Non-urgent messages can be sent to your provider as well.   To learn more about what you can do with MyChart, go to [email or web address].    Your next appointment:   6 week(s)  The format for your next appointment:   In Person  Provider:   Mertie Moores, MD       Important Information About Sugar

## 2021-10-23 DIAGNOSIS — Z992 Dependence on renal dialysis: Secondary | ICD-10-CM | POA: Diagnosis not present

## 2021-10-23 DIAGNOSIS — D631 Anemia in chronic kidney disease: Secondary | ICD-10-CM | POA: Diagnosis not present

## 2021-10-23 DIAGNOSIS — N2581 Secondary hyperparathyroidism of renal origin: Secondary | ICD-10-CM | POA: Diagnosis not present

## 2021-10-23 DIAGNOSIS — E1122 Type 2 diabetes mellitus with diabetic chronic kidney disease: Secondary | ICD-10-CM | POA: Diagnosis not present

## 2021-10-23 DIAGNOSIS — N186 End stage renal disease: Secondary | ICD-10-CM | POA: Diagnosis not present

## 2021-10-23 DIAGNOSIS — D509 Iron deficiency anemia, unspecified: Secondary | ICD-10-CM | POA: Diagnosis not present

## 2021-10-24 ENCOUNTER — Other Ambulatory Visit: Payer: Self-pay

## 2021-10-24 ENCOUNTER — Emergency Department (HOSPITAL_COMMUNITY): Payer: Medicare Other

## 2021-10-24 ENCOUNTER — Encounter (HOSPITAL_COMMUNITY): Payer: Self-pay | Admitting: *Deleted

## 2021-10-24 ENCOUNTER — Emergency Department (HOSPITAL_COMMUNITY)
Admission: EM | Admit: 2021-10-24 | Discharge: 2021-10-24 | Disposition: A | Discharge status: Home / Self Care | Payer: Medicare Other | Attending: Emergency Medicine | Admitting: Emergency Medicine

## 2021-10-24 DIAGNOSIS — D649 Anemia, unspecified: Secondary | ICD-10-CM | POA: Diagnosis not present

## 2021-10-24 DIAGNOSIS — D5919 Other autoimmune hemolytic anemia: Secondary | ICD-10-CM | POA: Diagnosis not present

## 2021-10-24 DIAGNOSIS — J90 Pleural effusion, not elsewhere classified: Secondary | ICD-10-CM | POA: Diagnosis not present

## 2021-10-24 DIAGNOSIS — R0602 Shortness of breath: Secondary | ICD-10-CM | POA: Diagnosis not present

## 2021-10-24 DIAGNOSIS — J9811 Atelectasis: Secondary | ICD-10-CM | POA: Diagnosis not present

## 2021-10-24 DIAGNOSIS — R531 Weakness: Secondary | ICD-10-CM | POA: Diagnosis not present

## 2021-10-24 DIAGNOSIS — Z7982 Long term (current) use of aspirin: Secondary | ICD-10-CM | POA: Diagnosis not present

## 2021-10-24 LAB — CBC WITH DIFFERENTIAL/PLATELET
Abs Immature Granulocytes: 0.03 10*3/uL (ref 0.00–0.07)
Basophils Absolute: 0 10*3/uL (ref 0.0–0.1)
Basophils Relative: 1 %
Eosinophils Absolute: 0.2 10*3/uL (ref 0.0–0.5)
Eosinophils Relative: 4 %
HCT: 22.5 % — ABNORMAL LOW (ref 39.0–52.0)
Hemoglobin: 6.7 g/dL — CL (ref 13.0–17.0)
Immature Granulocytes: 1 %
Lymphocytes Relative: 13 %
Lymphs Abs: 0.6 10*3/uL — ABNORMAL LOW (ref 0.7–4.0)
MCH: 30.9 pg (ref 26.0–34.0)
MCHC: 29.8 g/dL — ABNORMAL LOW (ref 30.0–36.0)
MCV: 103.7 fL — ABNORMAL HIGH (ref 80.0–100.0)
Monocytes Absolute: 0.4 10*3/uL (ref 0.1–1.0)
Monocytes Relative: 9 %
Neutro Abs: 3.2 10*3/uL (ref 1.7–7.7)
Neutrophils Relative %: 72 %
Platelets: 104 10*3/uL — ABNORMAL LOW (ref 150–400)
RBC: 2.17 MIL/uL — ABNORMAL LOW (ref 4.22–5.81)
RDW: 17.2 % — ABNORMAL HIGH (ref 11.5–15.5)
WBC: 4.4 10*3/uL (ref 4.0–10.5)
nRBC: 0 % (ref 0.0–0.2)

## 2021-10-24 LAB — COMPREHENSIVE METABOLIC PANEL
ALT: 35 U/L (ref 0–44)
AST: 25 U/L (ref 15–41)
Albumin: 3.6 g/dL (ref 3.5–5.0)
Alkaline Phosphatase: 87 U/L (ref 38–126)
Anion gap: 14 (ref 5–15)
BUN: 40 mg/dL — ABNORMAL HIGH (ref 8–23)
CO2: 28 mmol/L (ref 22–32)
Calcium: 9.1 mg/dL (ref 8.9–10.3)
Chloride: 97 mmol/L — ABNORMAL LOW (ref 98–111)
Creatinine, Ser: 6.13 mg/dL — ABNORMAL HIGH (ref 0.61–1.24)
GFR, Estimated: 9 mL/min — ABNORMAL LOW (ref >60)
Glucose, Bld: 293 mg/dL — ABNORMAL HIGH (ref 70–99)
Potassium: 4.2 mmol/L (ref 3.5–5.1)
Sodium: 139 mmol/L (ref 135–145)
Total Bilirubin: 0.9 mg/dL (ref 0.3–1.2)
Total Protein: 6.9 g/dL (ref 6.5–8.1)

## 2021-10-24 LAB — HEMOGLOBIN AND HEMATOCRIT, BLOOD
HCT: 24.2 % — ABNORMAL LOW (ref 39.0–52.0)
Hemoglobin: 7.5 g/dL — ABNORMAL LOW (ref 13.0–17.0)

## 2021-10-24 LAB — POC OCCULT BLOOD, ED: Fecal Occult Bld: POSITIVE — AB

## 2021-10-24 LAB — PREPARE RBC (CROSSMATCH)

## 2021-10-24 LAB — TROPONIN I (HIGH SENSITIVITY)
Troponin I (High Sensitivity): 41 ng/L — ABNORMAL HIGH (ref <18)
Troponin I (High Sensitivity): 37 ng/L — ABNORMAL HIGH (ref <18)

## 2021-10-24 MED ORDER — SODIUM CHLORIDE 0.9% IV SOLUTION
Freq: Once | INTRAVENOUS | Status: AC
Start: 2021-10-24 — End: 2021-10-24

## 2021-10-24 NOTE — ED Provider Notes (Signed)
Assumed care of patient from off-going team. For more details, please see note from same day.  In brief, this is a 81 year old male with ESRD on HD who p/w symptomatic anemia. Fecal occult positive with no frank blood.   Plan/Dispo at time of sign-out & ED Course since sign-out: '[ ]'$  labs, transfuse, admit vs DC  BP 134/60   Pulse 96   Temp (!) 97.2 F (36.2 C) (Oral)   Resp 18   Ht '5\' 11"'$  (1.803 m)   Wt 98 kg   SpO2 95%   BMI 30.13 kg/m   EKG on my review w/ RBBB, TWIs, wandering baseline but otherwise very similar to prior in September 2021 without signs of ischemia.  ED Course:   Clinical Course as of 10/25/21 1114  Fri Oct 24, 2021  0848 Fecal Occult Blood, POC(!): POSITIVE [HN]  0848 DG Chest Port 1 View Left basilar atelectasis and small left pleural effusion. [HN]  0858 Hemoglobin(!!): 6.7 [HN]  1003 Troponin I (High Sensitivity)(!): 41 In s/o ESRD. Will trend. [HN]  1610 Significant time spent discussing with patient and his wife. Informed them of findings including Hgb and troponin values. Patient does not currently have any chest pain and states that he recently saw his cardiologist who wanted to perform a left heart cath but wanted to figure out his hemoglobin first, which prompted this investigation.  Discussed with patient his fecal occult blood positive and the possibility of his ESRD in addition to a GI bleed causing his anemia.  Recommended that patient would be better served with an admission given his multiple comorbidities and reevaluations after the blood transfusion.  Patient and his wife do not want to be admitted or stay overnight.  Discussion of risks is had, including damage to his heart, need for return to the emergency department, further blood transfusions, or worsening of condition.  Patient and his wife report that they understand these risks and would like to receive the blood transfusion and be discharged.  I stated that I would like to retest the  hemoglobin after the blood transfusion and reengage in discussion. [HN]  1458 Patient and wife want to leave after H&H is drawn. They do not want to wait for the results. [HN]  1515 Hemoglobin(!): 7.5 [HN]  1515 Extensive discussion with patient and wife. They understand that they need to have his Hgb rechecked, which they state will happen at dialysis tomorrow morning.  They state that they will discuss with the dialysis doctor about what happened today.  They understand all discharge instructions and return precautions including lightheadedness, shortness of breath, chest pain, falls, syncope, GI bleeding and return to the ED if anything worsens.  All questions answered to patient's and wife satisfaction.  Patient states that he does not want to spend the rest of his life in the hospital and would like to go home.    Dispo: DC [HN]    Clinical Course User Index [HN] Audley Hose, MD    ------------------------------- Cindee Lame, MD Emergency Medicine  This note was created using dictation software, which may contain spelling or grammatical errors.   Audley Hose, MD 10/25/21 1115

## 2021-10-24 NOTE — ED Provider Notes (Signed)
[facility] EMERGENCY DEPARTMENT Provider Note   CSN: [id number] Arrival date & time: 10/24/21  8250     History  Chief Complaint  Patient presents with   low hemoglobin    Bradley Soto is a 81 y.o. male.  Patient presents to the emergency department for evaluation of low hemoglobin.  Patient is a dialysis patient.  He was told by his nephrologist that his hemoglobin was low and he needed to come to the ER to get blood.  Patient does report that he has noticed generalized weakness, decreased exercise tolerance recently.  He has not noticed any active bleeding or melanotic stools.  He says that his nephrologist has been checking his blood regularly and his hemoglobin has been steadily declining.       Home Medications Prior to Admission medications   Medication Sig Start Date End Date Taking? Authorizing Provider  allopurinol (ZYLOPRIM) 100 MG tablet Take 1 tablet (100 mg total) by mouth daily. 11/03/19   [name], [name], MD  aspirin EC 81 MG tablet Take 81 mg by mouth daily.    [provider]  B Complex-C-Zn-Folic Acid (DIALYVITE 539 WITH ZINC) 0.8 MG TABS Take 1 tablet by mouth at bedtime. 11/08/19   [provider]  gabapentin (NEURONTIN) 100 MG capsule Take 1 capsule (100 mg total) by mouth 3 (three) times daily. 09/24/21   Waynetta Sandy, MD  HYDROcodone-acetaminophen [facility]) 5-325 MG tablet Take 1 tablet by mouth every 6 (six) hours as needed for moderate pain. 01/24/20   [name], PA-C  iron sucrose in sodium chloride 0.9 % 100 mL once a week. 08/26/21 09/22/22  [provider]  Methoxy PEG-Epoetin Beta (MIRCERA IJ) Every two weeks at dialysis 01/25/21 06/13/22  [provider]  midodrine (PROAMATINE) 5 MG tablet TAKE 1 TABLET BY MOUTH THREE TIMES A WEEK AS DIRECTED ON DIALYSIS DAYS ONLY(T/TH/SAT) TAKE 1 TABLET 30 MINUTES PRIOR TO HD TREATMENT. BRING BOTTLE TO UNIT. MAY TAKE ADDITIONAL 1 TABLET HALFWAY THROUGH HD TREATMENT  UNDER DIRECTION OF THE RN FOR SBP <100 09/02/21   [provider]  nitroGLYCERIN (NITROSTAT) 0.4 MG SL tablet Place 1 tablet (0.4 mg total) under the tongue every 5 (five) minutes as needed for chest pain. 10/08/21   Nahser, [name], MD  NOVOLOG MIX 70/30 FLEXPEN (70-30) 100 UNIT/ML FlexPen Inject 30 Units into the skin daily. 30 unit Am and 30 units PM 02/26/16   [provider]  ondansetron (ZOFRAN) 4 MG tablet Take 1 tablet (4 mg total) by mouth every 6 (six) hours as needed for nausea. 11/03/19   [name], [name], MD  rosuvastatin (CRESTOR) 5 MG tablet Take 5 mg by mouth. 10/31/20   [provider]  sevelamer carbonate (RENVELA) 800 MG tablet Take 1 tablet (800 mg total) by mouth 3 (three) times daily with meals. 11/03/19   [name], [name], MD      Allergies    Other and Shellfish allergy    Review of Systems   Review of Systems  Physical Exam Updated Vital Signs BP 119/62 (BP Location: Left Arm)   Pulse 96   Temp (!) 97.2 F (36.2 C) (Oral)   Resp (!) 21   Ht '5\' 11"'$  (1.803 m)   Wt 98 kg   SpO2 95%   BMI 30.13 kg/m  Physical Exam Vitals and nursing note reviewed. Exam conducted with a chaperone present.  Constitutional:      General: He is not in acute distress.  Appearance: He is well-developed.  HENT:     Head: Normocephalic and atraumatic.     Mouth/Throat:     Mouth: Mucous membranes are moist.  Eyes:     General: Vision grossly intact. Gaze aligned appropriately.     Extraocular Movements: Extraocular movements intact.     Conjunctiva/sclera: Conjunctivae normal.  Cardiovascular:     Rate and Rhythm: Normal rate and regular rhythm.     Pulses: Normal pulses.     Heart sounds: Normal heart sounds, S1 normal and S2 normal. No murmur heard.    No friction rub. No gallop.  Pulmonary:     Effort: Pulmonary effort is normal. No respiratory distress.     Breath sounds: Normal breath sounds.  Abdominal:     Palpations: Abdomen is  soft.     Tenderness: There is no abdominal tenderness. There is no guarding or rebound.     Hernia: No hernia is present.  Genitourinary:    Prostate: Enlarged. Not tender and no nodules present.     Rectum: Guaiac result positive (brown stool).  Musculoskeletal:        General: No swelling.     Cervical back: Full passive range of motion without pain, normal range of motion and neck supple. No pain with movement, spinous process tenderness or muscular tenderness. Normal range of motion.     Right lower leg: No edema.     Left lower leg: No edema.  Skin:    General: Skin is warm and dry.     Capillary Refill: Capillary refill takes less than 2 seconds.     Coloration: Skin is pale.     Findings: No ecchymosis, erythema, lesion or wound.  Neurological:     Mental Status: He is alert and oriented to person, place, and time.     GCS: GCS eye subscore is 4. GCS verbal subscore is 5. GCS motor subscore is 6.     Cranial Nerves: Cranial nerves 2-12 are intact.     Sensory: Sensation is intact.     Motor: Motor function is intact. No weakness or abnormal muscle tone.     Coordination: Coordination is intact.  Psychiatric:        Mood and Affect: Mood normal.        Speech: Speech normal.        Behavior: Behavior normal.     ED Results / Procedures / Treatments   Labs (all labs ordered are listed, but only abnormal results are displayed) Labs Reviewed  POC OCCULT BLOOD, ED - Abnormal; Notable for the following components:      Result Value   Fecal Occult Bld POSITIVE (*)    All other components within normal limits  CBC WITH DIFFERENTIAL/PLATELET  COMPREHENSIVE METABOLIC PANEL  TYPE AND SCREEN  TROPONIN I (HIGH SENSITIVITY)    EKG EKG Interpretation  Date/Time:  Friday October 24 2021 07:24:51 EDT Ventricular Rate:  96 PR Interval:  175 QRS Duration: 158 QT Interval:  401 QTC Calculation: 507 R Axis:   -29 Text Interpretation: Sinus tachycardia Ventricular premature  complex RBBB Inferior infarct, old No significant change since last tracing Confirmed by Orpah Greek [phone]) on 10/24/2021 8:09:34 AM  Radiology DG Chest Port 1 View  Result Date: 10/24/2021 CLINICAL DATA:  Shortness of breath EXAM: PORTABLE CHEST 1 VIEW COMPARISON:  Chest x-ray dated October 31, 2019 FINDINGS: Cardiac and mediastinal contours are unchanged. Status post median sternotomy and CABG. Left atrial appendage closure clip. Left chest  wall dual lead pacer with unchanged lead position. Left basilar atelectasis and small left pleural effusion. No evidence of pneumothorax. IMPRESSION: Left basilar atelectasis and small left pleural effusion. Electronically Signed   By: Yetta Glassman M.D.   On: 10/24/2021 08:05    Procedures Procedures    Medications Ordered in ED Medications - No data to display  ED Course/ Medical Decision Making/ A&P                           Medical Decision Making Amount and/or Complexity of Data Reviewed Labs: ordered. Radiology: ordered.   Patient was referred to the ER for anemia.  Patient does have a history of chronic renal failure, is on dialysis Tuesday, Thursday, Saturday.  Patient tells me that his nephrologist has been seeing a persistent decline in hemoglobin recently.  It looks like his baseline hemoglobin a year ago was between 9 and 10.  He was told that his hemoglobin is currently 7.2.  He has not noticed any bleeding.  He denies any history of GI bleed.  Rectal exam revealed brown stool, but it was heme positive.  This is a potential cause for his downtrending hemoglobins.  No abdominal pain, tenderness.  He reports weakness, shortness of breath that would be expected with symptomatic anemia, no active chest pain currently.  He has, however, recently been experiencing chest pain, including with exertion.  He was seen by cardiology and cardiac catheterization was considered, however when it was discovered that he was anemic it was  recommended that the anemia be dealt with first.  Recheck hemoglobin and hematocrit.  Cycle cardiac enzymes.  Anticipate blood transfusion, possible admission.  Will sign out to oncoming ER physician to follow results.  CRITICAL CARE Performed by: Orpah Greek   Total critical care time: 32 minutes  Critical care time was exclusive of separately billable procedures and treating other patients.  Critical care was necessary to treat or prevent imminent or life-threatening deterioration.  Critical care was time spent personally by me on the following activities: development of treatment plan with patient and/or surrogate as well as nursing, discussions with consultants, evaluation of patient's response to treatment, examination of patient, obtaining history from patient or surrogate, ordering and performing treatments and interventions, ordering and review of laboratory studies, ordering and review of radiographic studies, pulse oximetry and re-evaluation of patient's condition.         Final Clinical Impression(s) / ED Diagnoses Final diagnoses:  Symptomatic anemia    Rx / DC Orders ED Discharge Orders     None         Majid Mccravy, Gwenyth Allegra, MD 10/24/21 [phone]

## 2021-10-24 NOTE — ED Triage Notes (Signed)
Pt states he was told to come here due to low hemoglobin; his kidney doctor told him his hemoglobin is 7.2;  pt having sob and weakness

## 2021-10-24 NOTE — ED Notes (Signed)
Patient blood transfusion completed, patient requesting to leave. Advise patient that it would be a risk to leave AMA. Notified EDP of patients request. EDP advise against leaving the ED explained the risks and consequences of leaving. Encourage patient to stay and be reevaluated for further testing. Patient states that he would rather die than continue to stay in the hospital. Patient verbalized understanding of  risks.

## 2021-10-24 NOTE — Discharge Instructions (Signed)
Thank you for coming any pain emergency department.  You were seen for symptomatic anemia and were given a blood transfusion.  Hemoglobin increased to 7.5 mg/dL.  You were offered but inclined not to stay for admission for further work-up and management.  You inclined to follow-up as an outpatient with dialysis tomorrow and with cardiology clinic.  Please follow-up with your primary care physician within 1 week and to go to dialysis tomorrow.  Please have your hemoglobin rechecked at that time.  Please do not stay to come back to the emergency department or call [phone] if you experience any lightheadedness, chest pain, shortness of breath, abdominal pain, bloody stools, dark tarry stools, fainting, falls, or anything else that concerns you.

## 2021-10-25 DIAGNOSIS — E1122 Type 2 diabetes mellitus with diabetic chronic kidney disease: Secondary | ICD-10-CM | POA: Diagnosis not present

## 2021-10-25 DIAGNOSIS — D631 Anemia in chronic kidney disease: Secondary | ICD-10-CM | POA: Diagnosis not present

## 2021-10-25 DIAGNOSIS — D509 Iron deficiency anemia, unspecified: Secondary | ICD-10-CM | POA: Diagnosis not present

## 2021-10-25 DIAGNOSIS — Z992 Dependence on renal dialysis: Secondary | ICD-10-CM | POA: Diagnosis not present

## 2021-10-25 DIAGNOSIS — N186 End stage renal disease: Secondary | ICD-10-CM | POA: Diagnosis not present

## 2021-10-25 DIAGNOSIS — N2581 Secondary hyperparathyroidism of renal origin: Secondary | ICD-10-CM | POA: Diagnosis not present

## 2021-10-25 LAB — TYPE AND SCREEN
ABO/RH(D): O POS
Antibody Screen: NEGATIVE
Unit division: 0

## 2021-10-25 LAB — BPAM RBC
Blood Product Expiration Date: 202310102359
ISSUE DATE / TIME: 202309081159
Unit Type and Rh: 5100

## 2021-10-28 DIAGNOSIS — Z992 Dependence on renal dialysis: Secondary | ICD-10-CM | POA: Diagnosis not present

## 2021-10-28 DIAGNOSIS — D509 Iron deficiency anemia, unspecified: Secondary | ICD-10-CM | POA: Diagnosis not present

## 2021-10-28 DIAGNOSIS — D631 Anemia in chronic kidney disease: Secondary | ICD-10-CM | POA: Diagnosis not present

## 2021-10-28 DIAGNOSIS — E1122 Type 2 diabetes mellitus with diabetic chronic kidney disease: Secondary | ICD-10-CM | POA: Diagnosis not present

## 2021-10-28 DIAGNOSIS — N2581 Secondary hyperparathyroidism of renal origin: Secondary | ICD-10-CM | POA: Diagnosis not present

## 2021-10-28 DIAGNOSIS — N186 End stage renal disease: Secondary | ICD-10-CM | POA: Diagnosis not present

## 2021-10-29 ENCOUNTER — Encounter: Payer: Self-pay | Admitting: *Deleted

## 2021-10-30 DIAGNOSIS — D631 Anemia in chronic kidney disease: Secondary | ICD-10-CM | POA: Diagnosis not present

## 2021-10-30 DIAGNOSIS — D509 Iron deficiency anemia, unspecified: Secondary | ICD-10-CM | POA: Diagnosis not present

## 2021-10-30 DIAGNOSIS — N186 End stage renal disease: Secondary | ICD-10-CM | POA: Diagnosis not present

## 2021-10-30 DIAGNOSIS — N2581 Secondary hyperparathyroidism of renal origin: Secondary | ICD-10-CM | POA: Diagnosis not present

## 2021-10-30 DIAGNOSIS — Z992 Dependence on renal dialysis: Secondary | ICD-10-CM | POA: Diagnosis not present

## 2021-10-30 DIAGNOSIS — E1122 Type 2 diabetes mellitus with diabetic chronic kidney disease: Secondary | ICD-10-CM | POA: Diagnosis not present

## 2021-10-31 ENCOUNTER — Encounter: Payer: Self-pay | Admitting: Diagnostic Neuroimaging

## 2021-10-31 ENCOUNTER — Ambulatory Visit (INDEPENDENT_AMBULATORY_CARE_PROVIDER_SITE_OTHER): Payer: Medicare Other | Admitting: Diagnostic Neuroimaging

## 2021-10-31 VITALS — BP 152/76 | HR 92 | Ht 71.0 in | Wt 220.0 lb

## 2021-10-31 DIAGNOSIS — E1142 Type 2 diabetes mellitus with diabetic polyneuropathy: Secondary | ICD-10-CM

## 2021-10-31 DIAGNOSIS — I200 Unstable angina: Secondary | ICD-10-CM

## 2021-10-31 DIAGNOSIS — G629 Polyneuropathy, unspecified: Secondary | ICD-10-CM

## 2021-10-31 NOTE — Progress Notes (Signed)
[facility]  PATIENT: Bradley Soto DOB: [birth year]  REFERRING CLINICIAN: [name], MD HISTORY FROM: patient REASON FOR VISIT: new consult   HISTORICAL  CHIEF COMPLAINT:  Chief Complaint  Patient presents with   New Patient (Initial Visit)    Rm 7,  Wife. Neuropathy in legs knees down to feet, and fingers (fine motors) and balance is not good.  (Since 2 yrs on dialysis).  (Tu Th Sat). Had Blood transfusion for anemia) recently.  Gabapentin '100mg'$  tid helps.  Concerned about balance, not being able to ambulate.  Has cane and walker (does not like to use).     HISTORY OF PRESENT ILLNESS:   81 year old male here for evaluation of neuropathy.  Patient has a 30-year history of diabetes.  Has been on dialysis more recently.  Has had numbness and tingling in feet without feeling off balance for several years as well.  Also has restless leg syndromes.  Has had unexplained anemia, getting further work-up.  On gabapentin 300 mg a day for some neuropathy pain.   REVIEW OF SYSTEMS: Full 14 system review of systems performed and negative with exception of: as per HPI.  ALLERGIES: Allergies  Allergen Reactions   Other Other (See Comments)   Shellfish Allergy Rash    HOME MEDICATIONS: Outpatient Medications Prior to Visit  Medication Sig Dispense Refill   allopurinol (ZYLOPRIM) 100 MG tablet Take 1 tablet (100 mg total) by mouth daily. 30 tablet 3   aspirin EC 81 MG tablet Take 81 mg by mouth daily.     B Complex-C-Zn-Folic Acid (DIALYVITE 637 WITH ZINC) 0.8 MG TABS Take 1 tablet by mouth at bedtime.     CALCITRIOL PO Take 4-5 tablets by mouth See admin instructions. Patient takes 4-5 calcitrol vitamin d tablets (1.'75mg'$  per patient's spouse) during dialysis (T, TH, Sat).     gabapentin (NEURONTIN) 100 MG capsule Take 1 capsule (100 mg total) by mouth 3 (three) times daily. 90 capsule 3   HYDROcodone-acetaminophen (NORCO) 5-325 MG tablet Take 1 tablet by mouth  every 6 (six) hours as needed for moderate pain. 10 tablet 0   iron sucrose in sodium chloride 0.9 % 100 mL once a week.     Methoxy PEG-Epoetin Beta (MIRCERA IJ) Every two weeks at dialysis     midodrine (PROAMATINE) 5 MG tablet TAKE 1 TABLET BY MOUTH THREE TIMES A WEEK AS DIRECTED ON DIALYSIS DAYS ONLY(T/TH/SAT) TAKE 1 TABLET 30 MINUTES PRIOR TO HD TREATMENT. BRING BOTTLE TO UNIT. MAY TAKE ADDITIONAL 1 TABLET HALFWAY THROUGH HD TREATMENT UNDER DIRECTION OF THE RN FOR SBP <100     nitroGLYCERIN (NITROSTAT) 0.4 MG SL tablet Place 1 tablet (0.4 mg total) under the tongue every 5 (five) minutes as needed for chest pain. 25 tablet 3   NOVOLOG MIX 70/30 FLEXPEN (70-30) 100 UNIT/ML FlexPen Inject 30 Units into the skin daily. 30 unit Am and 30 units PM  3   rosuvastatin (CRESTOR) 5 MG tablet Take 5 mg by mouth every other day. Patient takes on Mondays, Wednesdays, Fridays and Sundays.     sevelamer carbonate (RENVELA) 800 MG tablet Take 1 tablet (800 mg total) by mouth 3 (three) times daily with meals. 90 tablet 3   ondansetron (ZOFRAN) 4 MG tablet Take 1 tablet (4 mg total) by mouth every 6 (six) hours as needed for nausea. (Patient not taking: Reported on 10/24/2021) 30 tablet 1   No facility-administered medications prior to visit.    PAST MEDICAL HISTORY: Past  Medical History:  Diagnosis Date   Anxiety    Atrial fibrillation (HCC)    Bell's palsy    left side of face droops   CAD (coronary artery disease)    s/p stent placement   CKD (chronic kidney disease), stage III ([place])    [place]- TTHSat- [facility]   Colon polyps    Diabetes mellitus without complication ([place])    Gout    History of blood transfusion    History of kidney stones    Passes   HLD (hyperlipidemia)    HTN (hypertension)    Insomnia    MI (myocardial infarction) ([place]) 1998   Neuropathy    NSTEMI (non-ST elevated myocardial infarction) ([place])    s/p PCI in early 2000s   OSA on CPAP    wears CPAP   Presence of  permanent cardiac pacemaker    SSS (sick sinus syndrome) (HCC)    Type II or unspecified type diabetes mellitus without mention of complication, not stated as uncontrolled    Wears glasses    reading    Wears partial dentures     PAST SURGICAL HISTORY: Past Surgical History:  Procedure Laterality Date   APPENDECTOMY     AV FISTULA PLACEMENT Right 07/28/2019   Procedure: RIGHT ARM Brachiocephalic ARTERIOVENOUS (AV) FISTULA CREATION;  Surgeon: Serafina Mitchell, MD;  Location: [facility] OR;  Service: Vascular;  Laterality: Right;   CATARACT EXTRACTION     CLIPPING OF ATRIAL APPENDAGE  12/05/2018   Procedure: Clipping Of Atrial Appendage;  Surgeon: Wonda Olds, MD;  Location: [facility] OR;  Service: Open Heart Surgery;;   CORONARY ANGIOPLASTY WITH STENT PLACEMENT     x4   CORONARY ARTERY BYPASS GRAFT N/A 12/05/2018   Procedure: CORONARY ARTERY BYPASS GRAFTING (CABG) X FOUR USING LEFT INTERNAL MAMMARY ARTERY AND RIGHT SAPHENOUS VEIN GRAFTS;  Surgeon: Wonda Olds, MD;  Location: [place];  Service: Open Heart Surgery;  Laterality: N/A;   HERNIA REPAIR     ventral hernia repair with mesh--revision also was required   INSERT / REPLACE / REMOVE PACEMAKER  05/10/2000   pacemaker implanted in [place]]   IR THORACENTESIS ASP PLEURAL SPACE W/IMG GUIDE  12/21/2018   IR THORACENTESIS ASP PLEURAL SPACE W/IMG GUIDE  12/30/2018   LEFT HEART CATH AND CORONARY ANGIOGRAPHY N/A 11/30/2018   Procedure: LEFT HEART CATH AND CORONARY ANGIOGRAPHY;  Surgeon: Burnell Blanks, MD;  Location: [place] [facility];  Service: Cardiovascular;  Laterality: N/A;   MAZE N/A 12/05/2018   Procedure: MAZE;  Surgeon: Wonda Olds, MD;  Location: [place];  Service: Open Heart Surgery;  Laterality: N/A;   PACEMAKER GENERATOR CHANGE  09/03/09   MDT Adapta generator change in [place]  2/2   polyp that could not be removed on colonoscopy   PPM GENERATOR CHANGEOUT N/A 06/01/2019   Procedure: PPM  GENERATOR CHANGEOUT;  Surgeon: [name], MD;  Location: [place] CV LAB;  Service: Cardiovascular;  Laterality: N/A;   REVISON OF ARTERIOVENOUS FISTULA Right 01/24/2020   Procedure: BANDING OF RIGHT ARM ARTERIOVENOUS FISTULA;  Surgeon: Serafina Mitchell, MD;  Location: [facility];  Service: Vascular;  Laterality: Right;   TEE WITHOUT CARDIOVERSION N/A 12/05/2018   Procedure: TRANSESOPHAGEAL ECHOCARDIOGRAM (TEE);  Surgeon: Wonda Olds, MD;  Location: [place];  Service: Open Heart Surgery;  Laterality: N/A;    FAMILY HISTORY: Family History  Problem Relation Age of Onset   Heart attack Father 13  Heart disease Father    Breast cancer Sister    Pulmonary embolism Sister    Deep vein thrombosis Sister     SOCIAL HISTORY: Social History   Socioeconomic History   Marital status: Married    Spouse name: Not on file   Number of children: Not on file   Years of education: Not on file   Highest education level: Not on file  Occupational History   Not on file  Tobacco Use   Smoking status: Former    Types: Cigarettes   Smokeless tobacco: Never   Tobacco comments:    " many years ago"  Vaping Use   Vaping Use: Never used  Substance and Sexual Activity   Alcohol use: No   Drug use: No   Sexual activity: Not on file  Other Topics Concern   Not on file  Social History Narrative   Lives with wife   Caffeine coffee 2 daily   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   Social Determinants of Health   Financial Resource Strain: Not on file  Food Insecurity: Not on file  Transportation Needs: Not on file  Physical Activity: Not on file  Stress: Not on file  Social Connections: Not on file  Intimate Partner Violence: Not on file     PHYSICAL EXAM  GENERAL EXAM/CONSTITUTIONAL: Vitals:  Vitals:   10/31/21 1012  BP: (!) 152/76  Pulse: 92  Weight: 220 lb (99.8 kg)  Height: '5\' 11"'$  (1.803 m)   Body mass index is 30.68 kg/m. Wt Readings from Last 3 Encounters:   10/31/21 220 lb (99.8 kg)  10/24/21 216 lb 0.8 oz (98 kg)  10/22/21 217 lb (98.4 kg)   Patient is in no distress; well developed, nourished and groomed; neck is supple  CARDIOVASCULAR: Examination of carotid arteries is normal; no carotid bruits Regular rate and rhythm; BLOWING SYSTOLIC MURMUR Examination of peripheral vascular system by observation and palpation is normal; RIGHT ARM FISTULA  EYES: Ophthalmoscopic exam of optic discs and posterior segments is normal; no papilledema or hemorrhages No results found.  MUSCULOSKELETAL: Gait, strength, tone, movements noted in Neurologic exam below  NEUROLOGIC: MENTAL STATUS:      No data to display         awake, alert, oriented to person, place and time recent and remote memory intact normal attention and concentration language fluent, comprehension intact, naming intact fund of knowledge appropriate  CRANIAL NERVE:  2nd - no papilledema on fundoscopic exam 2nd, 3rd, 4th, 6th - pupils equal and reactive to light, visual fields full to confrontation, extraocular muscles intact, no nystagmus 5th - facial sensation symmetric 7th - facial strength --> LEFT UPPER AND LOWER FACIAL WEAKNESS WITH SYNKINESIS 8th - hearing intact 9th - palate elevates symmetrically, uvula midline 11th - shoulder shrug symmetric 12th - tongue protrusion midline  MOTOR:  normal bulk and tone, full strength in the BUE, BLE; EXCEPT ATROPHY AND WEAKNESS OF BILATERAL HAND INTRINSIC MUSCLES / FDI; RIGHT FOOT DF 4, LEFT FOOT DF 3  SENSORY:  normal and symmetric to light touch, temperature, vibration; EXCEPT DECR IN ANKLES / FEET  COORDINATION:  finger-nose-finger, fine finger movements normal  REFLEXES:  deep tendon reflexes TRACE and symmetric  GAIT/STATION:  narrow based gait; STEPPAGE, UNSTEADY GAIT     DIAGNOSTIC DATA (LABS, IMAGING, TESTING) - I reviewed patient records, labs, notes, testing and imaging myself where available.  Lab  Results  Component Value Date   WBC 4.4 10/24/2021   HGB  7.5 (L) 10/24/2021   HCT 24.2 (L) 10/24/2021   MCV 103.7 (H) 10/24/2021   PLT 104 (L) 10/24/2021      Component Value Date/Time   NA 139 10/24/2021 0728   NA 138 05/30/2019 0923   K 4.2 10/24/2021 0728   CL 97 (L) 10/24/2021 0728   CO2 28 10/24/2021 0728   GLUCOSE 293 (H) 10/24/2021 0728   BUN 40 (H) 10/24/2021 0728   BUN 72 (H) 05/30/2019 0923   CREATININE 6.13 (H) 10/24/2021 0728   CALCIUM 9.1 10/24/2021 0728   PROT 6.9 10/24/2021 0728   PROT 6.4 04/01/2016 1303   ALBUMIN 3.6 10/24/2021 0728   ALBUMIN 4.0 04/01/2016 1303   AST 25 10/24/2021 0728   ALT 35 10/24/2021 0728   ALKPHOS 87 10/24/2021 0728   BILITOT 0.9 10/24/2021 0728   BILITOT 0.4 04/01/2016 1303   GFRNONAA 9 (L) 10/24/2021 0728   GFRAA 9 (L) 11/03/2019 0533   Lab Results  Component Value Date   CHOL 85 12/02/2018   HDL 25 (L) 12/02/2018   LDLCALC 31 12/02/2018   LDLDIRECT 50.5 10/10/2013   TRIG 145 12/02/2018   CHOLHDL 3.4 12/02/2018   Lab Results  Component Value Date   HGBA1C 8.7 (H) 10/31/2019   No results found for: "VITAMINB12" Lab Results  Component Value Date   TSH 4.470 04/01/2016      ASSESSMENT AND PLAN  81 y.o. year old male here with:   Dx:  1. Neuropathy   2. Diabetic polyneuropathy associated with type 2 diabetes mellitus (HCC)     PLAN:  PAIN / NUMBNESS (feet > legs) --> - likely related to diabetes and ESRD associated neuropathy; also anemia and restless leg syndrome - continue gabapentin '300mg'$  daily for pain control - no further work-up at this time; continue conservative management and pain management - use cane / walker; fall precautions reviewed; maintain foot hygiene  Return for return to PCP, pending if symptoms worsen or fail to improve.    Penni Bombard, MD 9/73/5329, 92:42 AM Certified in Neurology, Neurophysiology and Neuroimaging  [facility] [address], [place], [place] [address] [phone]

## 2021-10-31 NOTE — Patient Instructions (Signed)
  PAIN / NUMBNESS (feet > legs)  - likely related to diabetes and ESRD associated neuropathy; also anemia, iron deficiency and restless leg syndrome  - continue gabapentin '300mg'$  daily for pain control  - use cane / walker

## 2021-11-01 DIAGNOSIS — N186 End stage renal disease: Secondary | ICD-10-CM | POA: Diagnosis not present

## 2021-11-01 DIAGNOSIS — E1122 Type 2 diabetes mellitus with diabetic chronic kidney disease: Secondary | ICD-10-CM | POA: Diagnosis not present

## 2021-11-01 DIAGNOSIS — N2581 Secondary hyperparathyroidism of renal origin: Secondary | ICD-10-CM | POA: Diagnosis not present

## 2021-11-01 DIAGNOSIS — D631 Anemia in chronic kidney disease: Secondary | ICD-10-CM | POA: Diagnosis not present

## 2021-11-01 DIAGNOSIS — Z992 Dependence on renal dialysis: Secondary | ICD-10-CM | POA: Diagnosis not present

## 2021-11-01 DIAGNOSIS — D509 Iron deficiency anemia, unspecified: Secondary | ICD-10-CM | POA: Diagnosis not present

## 2021-11-04 DIAGNOSIS — N186 End stage renal disease: Secondary | ICD-10-CM | POA: Diagnosis not present

## 2021-11-04 DIAGNOSIS — E1122 Type 2 diabetes mellitus with diabetic chronic kidney disease: Secondary | ICD-10-CM | POA: Diagnosis not present

## 2021-11-04 DIAGNOSIS — Z992 Dependence on renal dialysis: Secondary | ICD-10-CM | POA: Diagnosis not present

## 2021-11-04 DIAGNOSIS — D631 Anemia in chronic kidney disease: Secondary | ICD-10-CM | POA: Diagnosis not present

## 2021-11-04 DIAGNOSIS — N2581 Secondary hyperparathyroidism of renal origin: Secondary | ICD-10-CM | POA: Diagnosis not present

## 2021-11-04 DIAGNOSIS — D509 Iron deficiency anemia, unspecified: Secondary | ICD-10-CM | POA: Diagnosis not present

## 2021-11-06 DIAGNOSIS — N2581 Secondary hyperparathyroidism of renal origin: Secondary | ICD-10-CM | POA: Diagnosis not present

## 2021-11-06 DIAGNOSIS — D631 Anemia in chronic kidney disease: Secondary | ICD-10-CM | POA: Diagnosis not present

## 2021-11-06 DIAGNOSIS — E1122 Type 2 diabetes mellitus with diabetic chronic kidney disease: Secondary | ICD-10-CM | POA: Diagnosis not present

## 2021-11-06 DIAGNOSIS — Z992 Dependence on renal dialysis: Secondary | ICD-10-CM | POA: Diagnosis not present

## 2021-11-06 DIAGNOSIS — N186 End stage renal disease: Secondary | ICD-10-CM | POA: Diagnosis not present

## 2021-11-06 DIAGNOSIS — D509 Iron deficiency anemia, unspecified: Secondary | ICD-10-CM | POA: Diagnosis not present

## 2021-11-08 DIAGNOSIS — D631 Anemia in chronic kidney disease: Secondary | ICD-10-CM | POA: Diagnosis not present

## 2021-11-08 DIAGNOSIS — N186 End stage renal disease: Secondary | ICD-10-CM | POA: Diagnosis not present

## 2021-11-08 DIAGNOSIS — Z992 Dependence on renal dialysis: Secondary | ICD-10-CM | POA: Diagnosis not present

## 2021-11-08 DIAGNOSIS — N2581 Secondary hyperparathyroidism of renal origin: Secondary | ICD-10-CM | POA: Diagnosis not present

## 2021-11-08 DIAGNOSIS — E1122 Type 2 diabetes mellitus with diabetic chronic kidney disease: Secondary | ICD-10-CM | POA: Diagnosis not present

## 2021-11-08 DIAGNOSIS — D509 Iron deficiency anemia, unspecified: Secondary | ICD-10-CM | POA: Diagnosis not present

## 2021-11-11 DIAGNOSIS — D631 Anemia in chronic kidney disease: Secondary | ICD-10-CM | POA: Diagnosis not present

## 2021-11-11 DIAGNOSIS — N2581 Secondary hyperparathyroidism of renal origin: Secondary | ICD-10-CM | POA: Diagnosis not present

## 2021-11-11 DIAGNOSIS — E1122 Type 2 diabetes mellitus with diabetic chronic kidney disease: Secondary | ICD-10-CM | POA: Diagnosis not present

## 2021-11-11 DIAGNOSIS — N186 End stage renal disease: Secondary | ICD-10-CM | POA: Diagnosis not present

## 2021-11-11 DIAGNOSIS — Z992 Dependence on renal dialysis: Secondary | ICD-10-CM | POA: Diagnosis not present

## 2021-11-11 DIAGNOSIS — D509 Iron deficiency anemia, unspecified: Secondary | ICD-10-CM | POA: Diagnosis not present

## 2021-11-13 DIAGNOSIS — N2581 Secondary hyperparathyroidism of renal origin: Secondary | ICD-10-CM | POA: Diagnosis not present

## 2021-11-13 DIAGNOSIS — Z992 Dependence on renal dialysis: Secondary | ICD-10-CM | POA: Diagnosis not present

## 2021-11-13 DIAGNOSIS — N186 End stage renal disease: Secondary | ICD-10-CM | POA: Diagnosis not present

## 2021-11-13 DIAGNOSIS — E1122 Type 2 diabetes mellitus with diabetic chronic kidney disease: Secondary | ICD-10-CM | POA: Diagnosis not present

## 2021-11-13 DIAGNOSIS — D509 Iron deficiency anemia, unspecified: Secondary | ICD-10-CM | POA: Diagnosis not present

## 2021-11-13 DIAGNOSIS — D631 Anemia in chronic kidney disease: Secondary | ICD-10-CM | POA: Diagnosis not present

## 2021-11-14 ENCOUNTER — Telehealth (HOSPITAL_COMMUNITY): Payer: Self-pay | Admitting: Emergency Medicine

## 2021-11-14 ENCOUNTER — Inpatient Hospital Stay: Payer: Medicare Other | Attending: Hematology | Admitting: Hematology

## 2021-11-14 ENCOUNTER — Inpatient Hospital Stay: Payer: Medicare Other

## 2021-11-14 ENCOUNTER — Encounter: Payer: Self-pay | Admitting: Hematology

## 2021-11-14 VITALS — BP 116/69 | HR 87 | Temp 98.1°F | Resp 18 | Ht 71.0 in | Wt 215.7 lb

## 2021-11-14 DIAGNOSIS — D696 Thrombocytopenia, unspecified: Secondary | ICD-10-CM

## 2021-11-14 DIAGNOSIS — E1122 Type 2 diabetes mellitus with diabetic chronic kidney disease: Secondary | ICD-10-CM | POA: Insufficient documentation

## 2021-11-14 DIAGNOSIS — N186 End stage renal disease: Secondary | ICD-10-CM | POA: Insufficient documentation

## 2021-11-14 DIAGNOSIS — I120 Hypertensive chronic kidney disease with stage 5 chronic kidney disease or end stage renal disease: Secondary | ICD-10-CM | POA: Insufficient documentation

## 2021-11-14 DIAGNOSIS — D539 Nutritional anemia, unspecified: Secondary | ICD-10-CM | POA: Diagnosis not present

## 2021-11-14 DIAGNOSIS — Z803 Family history of malignant neoplasm of breast: Secondary | ICD-10-CM | POA: Insufficient documentation

## 2021-11-14 DIAGNOSIS — D631 Anemia in chronic kidney disease: Secondary | ICD-10-CM | POA: Diagnosis not present

## 2021-11-14 DIAGNOSIS — D509 Iron deficiency anemia, unspecified: Secondary | ICD-10-CM | POA: Diagnosis not present

## 2021-11-14 LAB — CBC WITH DIFFERENTIAL/PLATELET
Abs Immature Granulocytes: 0.04 10*3/uL (ref 0.00–0.07)
Basophils Absolute: 0 10*3/uL (ref 0.0–0.1)
Basophils Relative: 0 %
Eosinophils Absolute: 0.2 10*3/uL (ref 0.0–0.5)
Eosinophils Relative: 3 %
HCT: 25 % — ABNORMAL LOW (ref 39.0–52.0)
Hemoglobin: 7.7 g/dL — ABNORMAL LOW (ref 13.0–17.0)
Immature Granulocytes: 1 %
Lymphocytes Relative: 12 %
Lymphs Abs: 0.9 10*3/uL (ref 0.7–4.0)
MCH: 31.7 pg (ref 26.0–34.0)
MCHC: 30.8 g/dL (ref 30.0–36.0)
MCV: 102.9 fL — ABNORMAL HIGH (ref 80.0–100.0)
Monocytes Absolute: 0.5 10*3/uL (ref 0.1–1.0)
Monocytes Relative: 7 %
Neutro Abs: 5.5 10*3/uL (ref 1.7–7.7)
Neutrophils Relative %: 77 %
Platelets: 116 10*3/uL — ABNORMAL LOW (ref 150–400)
RBC: 2.43 MIL/uL — ABNORMAL LOW (ref 4.22–5.81)
RDW: 15.9 % — ABNORMAL HIGH (ref 11.5–15.5)
WBC: 7.2 10*3/uL (ref 4.0–10.5)
nRBC: 0 % (ref 0.0–0.2)

## 2021-11-14 LAB — RETICULOCYTES
Immature Retic Fract: 16.6 % — ABNORMAL HIGH (ref 2.3–15.9)
RBC.: 2.48 MIL/uL — ABNORMAL LOW (ref 4.22–5.81)
Retic Count, Absolute: 49.1 10*3/uL (ref 19.0–186.0)
Retic Ct Pct: 2 % (ref 0.4–3.1)

## 2021-11-14 LAB — IRON AND TIBC
Iron: 29 ug/dL — ABNORMAL LOW (ref 45–182)
Saturation Ratios: 10 % — ABNORMAL LOW (ref 17.9–39.5)
TIBC: 300 ug/dL (ref 250–450)
UIBC: 271 ug/dL

## 2021-11-14 LAB — VITAMIN B12: Vitamin B-12: 375 pg/mL (ref 180–914)

## 2021-11-14 LAB — FOLATE: Folate: 36 ng/mL (ref >5.9)

## 2021-11-14 LAB — LACTATE DEHYDROGENASE: LDH: 110 U/L (ref 98–192)

## 2021-11-14 LAB — FERRITIN: Ferritin: 1248 ng/mL — ABNORMAL HIGH (ref 24–336)

## 2021-11-14 LAB — DIRECT ANTIGLOBULIN TEST (NOT AT ARMC)
DAT, IgG: NEGATIVE
DAT, complement: NEGATIVE

## 2021-11-14 NOTE — Progress Notes (Signed)
CONSULT NOTE  Patient Care Team: [name], MD as PCP - General (Internal Medicine) Nahser, [name], MD as PCP - Cardiology (Cardiology) [facility] Derek Jack, MD as Medical Oncologist (Hematology)  CHIEF COMPLAINTS/PURPOSE OF CONSULTATION:  Macrocytic anemia  HISTORY OF PRESENTING ILLNESS:  Bradley Soto 81 y.o. male is seen today at the request of Dr. Marval Regal for further work-up and management of microcytic anemia.  CBC on 10/24/2021 showed hemoglobin 6.7 and MCV of 103.7.  Stool for occult blood was positive.  He received 1 unit of PRBC with improvement in hemoglobin to 7.5.  He has been on hemodialysis for the last couple of years.  He reportedly gets iron sucrose weekly with hemodialysis.  Denies any bright red blood per rectum.  Denies any ice pica.  Previous transfusion was 20 to 30 years ago prior to the latest 1.  Colonoscopy was more than 10 years ago.  He lives with his wife at home and is independent of ADLs and IADLs.  He is a non-smoker.  Owned a Copywriter, advertising.    MEDICAL HISTORY:  Past Medical History:  Diagnosis Date   Anxiety    Atrial fibrillation (HCC)    Bell's palsy    left side of face droops   CAD (coronary artery disease)    s/p stent placement   CKD (chronic kidney disease), stage III ([place])    [place]- [facility]- [facility]   Colon polyps    Diabetes mellitus without complication (HCC)    Gout    History of blood transfusion    History of kidney stones    Passes   HLD (hyperlipidemia)    HTN (hypertension)    Insomnia    MI (myocardial infarction) ([place]) 1998   Neuropathy    NSTEMI (non-ST elevated myocardial infarction) ([place])    s/p PCI in early 2000s   OSA on CPAP    wears CPAP   Presence of permanent cardiac pacemaker    SSS (sick sinus syndrome) (HCC)    Type II or unspecified type diabetes mellitus without mention of complication, not stated as uncontrolled    Wears glasses     reading    Wears partial dentures     SURGICAL HISTORY: Past Surgical History:  Procedure Laterality Date   APPENDECTOMY     AV FISTULA PLACEMENT Right 07/28/2019   Procedure: RIGHT ARM Brachiocephalic ARTERIOVENOUS (AV) FISTULA CREATION;  Surgeon: Serafina Mitchell, MD;  Location: [facility];  Service: Vascular;  Laterality: Right;   CATARACT EXTRACTION     CLIPPING OF ATRIAL APPENDAGE  12/05/2018   Procedure: Clipping Of Atrial Appendage;  Surgeon: Wonda Olds, MD;  Location: [facility];  Service: Open Heart Surgery;;   CORONARY ANGIOPLASTY WITH STENT PLACEMENT     x4   CORONARY ARTERY BYPASS GRAFT N/A 12/05/2018   Procedure: CORONARY ARTERY BYPASS GRAFTING (CABG) X FOUR USING LEFT INTERNAL MAMMARY ARTERY AND RIGHT SAPHENOUS VEIN GRAFTS;  Surgeon: Wonda Olds, MD;  Location: [place];  Service: Open Heart Surgery;  Laterality: N/A;   HERNIA REPAIR     ventral hernia repair with mesh--revision also was required   INSERT / REPLACE / REMOVE PACEMAKER  05/10/2000   pacemaker implanted in [place]]   IR THORACENTESIS ASP PLEURAL SPACE W/IMG GUIDE  12/21/2018   IR THORACENTESIS ASP PLEURAL SPACE W/IMG GUIDE  12/30/2018   LEFT HEART CATH AND CORONARY ANGIOGRAPHY N/A 11/30/2018   Procedure: LEFT HEART CATH AND CORONARY ANGIOGRAPHY;  Surgeon: Burnell Blanks, MD;  Location: [place] [facility];  Service: Cardiovascular;  Laterality: N/A;   MAZE N/A 12/05/2018   Procedure: MAZE;  Surgeon: Wonda Olds, MD;  Location: [facility];  Service: Open Heart Surgery;  Laterality: N/A;   PACEMAKER GENERATOR CHANGE  09/03/09   MDT Adapta generator change in [place]  2/2   polyp that could not be removed on colonoscopy   PPM GENERATOR CHANGEOUT N/A 06/01/2019   Procedure: PPM GENERATOR CHANGEOUT;  Surgeon: [name], MD;  Location: [facility];  Service: Cardiovascular;  Laterality: N/A;   REVISON OF ARTERIOVENOUS FISTULA Right 01/24/2020   Procedure: BANDING OF RIGHT  ARM ARTERIOVENOUS FISTULA;  Surgeon: Serafina Mitchell, MD;  [facility];  Service: Vascular;  Laterality: Right;   TEE WITHOUT CARDIOVERSION N/A 12/05/2018   Procedure: TRANSESOPHAGEAL ECHOCARDIOGRAM (TEE);  Surgeon: Wonda Olds, MD;  Location: [facility];  Service: Open Heart Surgery;  Laterality: N/A;    SOCIAL HISTORY: Social History   Socioeconomic History   Marital status: Married    Spouse name: Not on file   Number of children: Not on file   Years of education: Not on file   Highest education level: Not on file  Occupational History   Not on file  Tobacco Use   Smoking status: Former    Types: Cigarettes   Smokeless tobacco: Never   Tobacco comments:    " many years ago"  Vaping Use   Vaping Use: Never used  Substance and Sexual Activity   Alcohol use: No   Drug use: No   Sexual activity: Not on file  Other Topics Concern   Not on file  Social History Narrative   Lives with wife   Caffeine coffee 2 daily   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   Social Determinants of Health   Financial Resource Strain: Not on file  Food Insecurity: Not on file  Transportation Needs: Not on file  Physical Activity: Not on file  Stress: Not on file  Social Connections: Not on file  Intimate Partner Violence: Not on file    FAMILY HISTORY: Family History  Problem Relation Age of Onset   Heart attack Father 34   Heart disease Father    Breast cancer Sister    Pulmonary embolism Sister    Deep vein thrombosis Sister     ALLERGIES:  is allergic to other and shellfish allergy.  MEDICATIONS:  Current Outpatient Medications  Medication Sig Dispense Refill   allopurinol (ZYLOPRIM) 100 MG tablet Take 1 tablet (100 mg total) by mouth daily. 30 tablet 3   aspirin EC 81 MG tablet Take 81 mg by mouth daily.     B Complex-C-Zn-Folic Acid (DIALYVITE 937 WITH ZINC) 0.8 MG TABS Take 1 tablet by mouth at bedtime.     CALCITRIOL PO Take 4-5 tablets by mouth See admin  instructions. Patient takes 4-5 calcitrol vitamin d tablets (1.'75mg'$  per patient's spouse) during dialysis (T, TH, Sat).     gabapentin (NEURONTIN) 100 MG capsule Take 1 capsule (100 mg total) by mouth 3 (three) times daily. 90 capsule 3   HYDROcodone-acetaminophen (NORCO) 5-325 MG tablet Take 1 tablet by mouth every 6 (six) hours as needed for moderate pain. 10 tablet 0   iron sucrose in sodium chloride 0.9 % 100 mL once a week.     Methoxy PEG-Epoetin Beta (MIRCERA IJ) Every two weeks at dialysis  midodrine (PROAMATINE) 5 MG tablet TAKE 1 TABLET BY MOUTH THREE TIMES A WEEK AS DIRECTED ON DIALYSIS DAYS ONLY(T/TH/SAT) TAKE 1 TABLET 30 MINUTES PRIOR TO HD TREATMENT. BRING BOTTLE TO UNIT. MAY TAKE ADDITIONAL 1 TABLET HALFWAY THROUGH HD TREATMENT UNDER DIRECTION OF THE RN FOR SBP <100     nitroGLYCERIN (NITROSTAT) 0.4 MG SL tablet Place 1 tablet (0.4 mg total) under the tongue every 5 (five) minutes as needed for chest pain. 25 tablet 3   NOVOLOG MIX 70/30 FLEXPEN (70-30) 100 UNIT/ML FlexPen Inject 30 Units into the skin daily. 30 unit Am and 30 units PM  3   ondansetron (ZOFRAN) 4 MG tablet Take 1 tablet (4 mg total) by mouth every 6 (six) hours as needed for nausea. 30 tablet 1   rosuvastatin (CRESTOR) 5 MG tablet Take 5 mg by mouth every other day. Patient takes on Mondays, Wednesdays, Fridays and Sundays.     sevelamer carbonate (RENVELA) 800 MG tablet Take 1 tablet (800 mg total) by mouth 3 (three) times daily with meals. 90 tablet 3   No current facility-administered medications for this visit.    REVIEW OF SYSTEMS:   Constitutional: Denies fevers, chills or abnormal night sweats Eyes: Denies blurriness of vision, double vision or watery eyes Ears, nose, mouth, throat, and face: Denies mucositis or sore throat Respiratory: Denies cough, dyspnea or wheezes Cardiovascular: Positive for dyspnea and chest pain on exertion. Gastrointestinal:  Denies nausea, heartburn or change in bowel habits.   Positive for diarrhea. Skin: Denies abnormal skin rashes Lymphatics: Denies new lymphadenopathy or easy bruising Neurological:Denies numbness, tingling or new weaknesses Behavioral/Psych: Mood is stable, no new changes  All other systems were reviewed with the patient and are negative.  PHYSICAL EXAMINATION: ECOG PERFORMANCE STATUS: 1 - Symptomatic but completely ambulatory  Vitals:   11/14/21 0834  BP: 116/69  Pulse: 87  Resp: 18  Temp: 98.1 F (36.7 C)  SpO2: 92%   Filed Weights   11/14/21 0834  Weight: 215 lb 11.2 oz (97.8 kg)    GENERAL:alert, no distress and comfortable SKIN: skin color, texture, turgor are normal, no rashes or significant lesions EYES: normal, conjunctiva are pink and non-injected, sclera clear OROPHARYNX:no exudate, no erythema and lips, buccal mucosa, and tongue normal  NECK: supple, thyroid normal size, non-tender, without nodularity LYMPH:  no palpable lymphadenopathy in the cervical, axillary or inguinal LUNGS: clear to auscultation and percussion with normal breathing effort HEART: regular rate & rhythm and no murmurs and no lower extremity edema ABDOMEN:abdomen soft, non-tender and normal bowel sounds Musculoskeletal:no cyanosis of digits and no clubbing  PSYCH: alert & oriented x 3 with fluent speech NEURO: no focal motor/sensory deficits  LABORATORY DATA:  I have reviewed the data as listed Recent Results (from the past 2160 hour(s))  CUP PACEART REMOTE DEVICE CHECK     Status: None   Collection Time: 08/28/21 11:52 PM  Result Value Ref Range   Date Time Interrogation Session [id number]    Pulse Generator Manufacturer MERM    Pulse Gen Model [id number] Azure XT DR MRI    Pulse Gen Serial Number [id number]    Clinic Name [facility]    Implantable Pulse Generator Type Implantable Pulse Generator    Implantable Pulse Generator Implant Date 61607371    Implantable Lead Manufacturer [facility]    Implantable Lead Model 4092 CapSure SP Novus     Implantable Lead Serial Number [id number]    Implantable Lead Implant Date 06269485    Implantable Lead  Location Detail 1 UNKNOWN    Implantable Lead Location [id number]    Implantable Lead Manufacturer MERM    Implantable Lead Model [id number] CapSure SP    Implantable Lead Serial Number [id number]    Implantable Lead Implant Date 09628366    Implantable Lead Location Detail 1 UNKNOWN    Implantable Lead Location [phone]    Lead Channel Setting Sensing Sensitivity 1.2 mV   Lead Channel Setting Pacing Amplitude 3 V   Lead Channel Setting Pacing Pulse Width 0.4 ms   Lead Channel Setting Pacing Amplitude 2.5 V   Lead Channel Impedance Value 304 ohm   Lead Channel Impedance Value 247 ohm   Lead Channel Sensing Intrinsic Amplitude 0.5 mV   Lead Channel Sensing Intrinsic Amplitude 0.5 mV   Lead Channel Pacing Threshold Amplitude 2.5 V   Lead Channel Pacing Threshold Pulse Width 0.4 ms   Lead Channel Impedance Value 608 ohm   Lead Channel Impedance Value 456 ohm   Lead Channel Sensing Intrinsic Amplitude 19.5 mV   Lead Channel Sensing Intrinsic Amplitude 19.5 mV   Lead Channel Pacing Threshold Amplitude 0.625 V   Lead Channel Pacing Threshold Pulse Width 0.4 ms   Battery Status OK    Battery Remaining Longevity 126 mo   Battery Voltage 3.02 V   Brady Statistic RA Percent Paced 5.59 %   Brady Statistic RV Percent Paced 0.04 %   Brady Statistic AP VP Percent 0 %   Brady Statistic AS VP Percent 0.04 %   Brady Statistic AP VS Percent 5.29 %   Brady Statistic AS VS Percent 94.67 %  CBC with Differential/Platelet     Status: Abnormal   Collection Time: 10/24/21  7:28 AM  Result Value Ref Range   WBC 4.4 4.0 - 10.5 K/uL   RBC 2.17 (L) 4.22 - 5.81 MIL/uL   Hemoglobin 6.7 (LL) 13.0 - 17.0 g/dL    Comment: REPEATED TO VERIFY THIS CRITICAL RESULT HAS VERIFIED AND BEEN CALLED TO WELCH K BY LATISHA HENDERSON ON 09 08 2023 AT 0854, AND HAS BEEN READ BACK.     HCT 22.5 (L) 39.0 - 52.0 %   MCV 103.7  (H) 80.0 - 100.0 fL   MCH 30.9 26.0 - 34.0 pg   MCHC 29.8 (L) 30.0 - 36.0 g/dL   RDW 17.2 (H) 11.5 - 15.5 %   Platelets 104 (L) 150 - 400 K/uL   nRBC 0.0 0.0 - 0.2 %   Neutrophils Relative % 72 %   Neutro Abs 3.2 1.7 - 7.7 K/uL   Lymphocytes Relative 13 %   Lymphs Abs 0.6 (L) 0.7 - 4.0 K/uL   Monocytes Relative 9 %   Monocytes Absolute 0.4 0.1 - 1.0 K/uL   Eosinophils Relative 4 %   Eosinophils Absolute 0.2 0.0 - 0.5 K/uL   Basophils Relative 1 %   Basophils Absolute 0.0 0.0 - 0.1 K/uL   WBC Morphology MORPHOLOGY UNREMARKABLE    RBC Morphology MORPHOLOGY UNREMARKABLE    Smear Review MORPHOLOGY UNREMARKABLE    Immature Granulocytes 1 %   Abs Immature Granulocytes 0.03 0.00 - 0.07 K/uL    Comment: Performed at [facility], [address], [place], [place] [address]  Comprehensive metabolic panel     Status: Abnormal   Collection Time: 10/24/21  7:28 AM  Result Value Ref Range   Sodium 139 135 - 145 mmol/L   Potassium 4.2 3.5 - 5.1 mmol/L   Chloride 97 (L) 98 - 111 mmol/L  CO2 28 22 - 32 mmol/L   Glucose, Bld 293 (H) 70 - 99 mg/dL    Comment: Glucose reference range applies only to samples taken after fasting for at least 8 hours.   BUN 40 (H) 8 - 23 mg/dL   Creatinine, Ser 6.13 (H) 0.61 - 1.24 mg/dL   Calcium 9.1 8.9 - 10.3 mg/dL   Total Protein 6.9 6.5 - 8.1 g/dL   Albumin 3.6 3.5 - 5.0 g/dL   AST 25 15 - 41 U/L   ALT 35 0 - 44 U/L   Alkaline Phosphatase 87 38 - 126 U/L   Total Bilirubin 0.9 0.3 - 1.2 mg/dL   GFR, Estimated 9 (L) >60 mL/min    Comment: (NOTE) Calculated using the CKD-EPI Creatinine Equation (2021)    Anion gap 14 5 - 15    Comment: Performed at [facility], [address] [place], [place] [address]  Type and screen     Status: None   Collection Time: 10/24/21  7:28 AM  Result Value Ref Range   ABO/RH(D) O POS    Antibody Screen NEG    Sample Expiration 10/27/2021,2359    Unit Number [id number]    Blood Component Type RED CELLS,LR    Unit  division 00    Status of Unit ISSUED,FINAL    Transfusion Status OK TO TRANSFUSE    Crossmatch Result      Compatible Performed at [facility], [address], [place], [place] [address]   Prepare RBC (crossmatch)     Status: None   Collection Time: 10/24/21  7:28 AM  Result Value Ref Range   Order Confirmation      ORDER PROCESSED BY BLOOD BANK Performed at [facility], [address], [place], [place] [address]   BPAM RBC     Status: None   Collection Time: 10/24/21  7:28 AM  Result Value Ref Range   ISSUE DATE / TIME [id number]    Blood Product Unit Number [id number]    PRODUCT CODE [id number]    Unit Type and Rh 5100    Blood Product Expiration Date [id number]   POC occult blood, ED Provider will collect     Status: Abnormal   Collection Time: 10/24/21  7:45 AM  Result Value Ref Range   Fecal Occult Bld POSITIVE (A) NEGATIVE  Troponin I (High Sensitivity)     Status: Abnormal   Collection Time: 10/24/21  8:12 AM  Result Value Ref Range   Troponin I (High Sensitivity) 41 (H) <18 ng/L    Comment: (NOTE) Elevated high sensitivity troponin I (hsTnI) values and significant  changes across serial measurements may suggest ACS but many other  chronic and acute conditions are known to elevate hsTnI results.  Refer to the "Links" section for chest pain algorithms and additional  guidance. Performed at [facility], [address], [place], [place] [address]   Troponin I (High Sensitivity)     Status: Abnormal   Collection Time: 10/24/21 10:03 AM  Result Value Ref Range   Troponin I (High Sensitivity) 37 (H) <18 ng/L    Comment: (NOTE) Elevated high sensitivity troponin I (hsTnI) values and significant  changes across serial measurements may suggest ACS but many other  chronic and acute conditions are known to elevate hsTnI results.  Refer to the "Links" section for chest pain algorithms and additional  guidance. Performed at [facility], [address] [place],  [place] [address]   Hemoglobin and hematocrit, blood  Status: Abnormal   Collection Time: 10/24/21  2:57 PM  Result Value Ref Range   Hemoglobin 7.5 (L) 13.0 - 17.0 g/dL   HCT 24.2 (L) 39.0 - 52.0 %    Comment: Performed at [facility], [address] [place], [place] [address]  CBC with Differential     Status: Abnormal   Collection Time: 11/14/21  9:27 AM  Result Value Ref Range   WBC 7.2 4.0 - 10.5 K/uL   RBC 2.43 (L) 4.22 - 5.81 MIL/uL   Hemoglobin 7.7 (L) 13.0 - 17.0 g/dL   HCT 25.0 (L) 39.0 - 52.0 %   MCV 102.9 (H) 80.0 - 100.0 fL   MCH 31.7 26.0 - 34.0 pg   MCHC 30.8 30.0 - 36.0 g/dL   RDW 15.9 (H) 11.5 - 15.5 %   Platelets 116 (L) 150 - 400 K/uL   nRBC 0.0 0.0 - 0.2 %   Neutrophils Relative % 77 %   Neutro Abs 5.5 1.7 - 7.7 K/uL   Lymphocytes Relative 12 %   Lymphs Abs 0.9 0.7 - 4.0 K/uL   Monocytes Relative 7 %   Monocytes Absolute 0.5 0.1 - 1.0 K/uL   Eosinophils Relative 3 %   Eosinophils Absolute 0.2 0.0 - 0.5 K/uL   Basophils Relative 0 %   Basophils Absolute 0.0 0.0 - 0.1 K/uL   Immature Granulocytes 1 %   Abs Immature Granulocytes 0.04 0.00 - 0.07 K/uL    Comment: Performed at [facility], [address] [place], [place] [address]  Reticulocytes     Status: Abnormal   Collection Time: 11/14/21  9:27 AM  Result Value Ref Range   Retic Ct Pct 2.0 0.4 - 3.1 %   RBC. 2.48 (L) 4.22 - 5.81 MIL/uL   Retic Count, Absolute 49.1 19.0 - 186.0 K/uL   Immature Retic Fract 16.6 (H) 2.3 - 15.9 %    Comment: Performed at [facility], [address] [place], [place] [address]  Lactate dehydrogenase     Status: None   Collection Time: 11/14/21  9:27 AM  Result Value Ref Range   LDH 110 98 - 192 U/L    Comment: Performed at [facility], [address] [place], [place] [address]  Direct antiglobulin test     Status: None   Collection Time: 11/14/21  9:27 AM  Result Value Ref Range   DAT, complement NEG    DAT, IgG      NEG Performed at [facility]  [address], [place], [place] [address]   Vitamin B12     Status: None   Collection Time: 11/14/21  9:27 AM  Result Value Ref Range   Vitamin B-12 375 180 - 914 pg/mL    Comment: (NOTE) This assay is not validated for testing neonatal or myeloproliferative syndrome specimens for Vitamin B12 levels. Performed at [facility], [address], [place], [place] [address]   Folate     Status: None   Collection Time: 11/14/21  9:27 AM  Result Value Ref Range   Folate 36.0 >5.9 ng/mL    Comment: RESULTS CONFIRMED BY MANUAL DILUTION Performed at [facility], [address], [place], [place] [address]   Iron and TIBC [facility] DW[facility]/AP/[facility]/[place] ONLY)     Status: Abnormal   Collection Time: 11/14/21  9:27 AM  Result Value Ref Range   Iron 29 (L) 45 - 182 ug/dL   TIBC 300 250 - 450 ug/dL   Saturation Ratios 10 (L)  17.9 - 39.5 %   UIBC 271 ug/dL    Comment: Performed at [facility], [address], [place], [place] [address]  Ferritin     Status: Abnormal   Collection Time: 11/14/21  9:27 AM  Result Value Ref Range   Ferritin 1,248 (H) 24 - 336 ng/mL    Comment: Performed at [facility], [address], [place], [place] [address]    RADIOGRAPHIC STUDIES: I have personally reviewed the radiological images as listed and agreed with the findings in the report. DG Chest Port 1 View  Result Date: 10/24/2021 CLINICAL DATA:  Shortness of breath EXAM: PORTABLE CHEST 1 VIEW COMPARISON:  Chest x-ray dated October 31, 2019 FINDINGS: Cardiac and mediastinal contours are unchanged. Status post median sternotomy and CABG. Left atrial appendage closure clip. Left chest wall dual lead pacer with unchanged lead position. Left basilar atelectasis and small left pleural effusion. No evidence of pneumothorax. IMPRESSION: Left basilar atelectasis and small left pleural effusion. Electronically Signed   By: Yetta Glassman M.D.   On: 10/24/2021 08:05    ASSESSMENT :  1.  Normocytic to macrocytic anemia: - Patient  seen at the request of Dr. Marval Regal.  Patient on HD for ESRD. - CBC on 10/24/2021: Hb-6.7, MCV-103.7, PLT-104, WBC-4.4 - He has a mild to moderate anemia since October 2020 - Last colonoscopy was 10 years ago in [place]. - He has received 1 unit PRBC on 10/24/2021 for hemoglobin 6.7. - No BRBPR/melena.  No ice pica.  Stool was positive for occult blood on 10/24/2021. - He has been on hemodialysis for last 2 years.  Wife reports that he is receiving iron sucrose weekly with hemodialysis.  Not clear if he is receiving Epogen.  2.  Social/history: - Lives with wife at home.  He is independent of ADLs and IADLs. 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  Quit smoking at age 52. - Sister had breast cancer.   PLAN:  1.  Macrocytic anemia: - Combination anemia from end-stage renal disease, bleeding as evidenced by stool positive for occult blood.  Early MDS is also a possibility. - Patient does not want to have colonoscopy again. - He is receiving iron sucrose weekly with dialysis.  Not clear if he is receiving Epogen. - We will repeat CBC today and check for hemolysis.  We will check for nutritional deficiencies.  We will also check for bone marrow infiltrative process. - RTC 2 to 3 weeks for follow-up.  2.  Mild thrombocytopenia: - Platelet count has been low on and off since 2015. - We will check for nutritional deficiency disorders, infectious etiologies, and connective tissue disorders. - We will check spleen ultrasound.    All questions were answered. The patient knows to call the clinic with any problems, questions or concerns.      Derek Jack, MD 11/14/21 2:41 PM

## 2021-11-14 NOTE — Patient Instructions (Addendum)
[facility]  Discharge Instructions  You were seen and examined today by Dr. Delton Coombes. Dr. Delton Coombes is a hematologist, meaning that he specializes in blood abnormalities. Dr. Delton Coombes discussed your past medical history, family history of cancers/blood conditions and the events that led to you being here today.  You were referred to Dr. Delton Coombes due to anemia as well as low platelets. Dr. Delton Coombes has recommended additional labs today to assess further. You have been anemic since beginning Dialysis and your platelets have been intermittently low since 2015.  Follow-up as scheduled.  Thank you for choosing [facility] to provide your oncology and hematology care.   To afford each patient quality time with our provider, please arrive at least 15 minutes before your scheduled appointment time. You may need to reschedule your appointment if you arrive late (10 or more minutes). Arriving late affects you and other patients whose appointments are after yours.  Also, if you miss three or more appointments without notifying the office, you may be dismissed from the clinic at the provider's discretion.    Again, thank you for choosing [facility].  Our hope is that these requests will decrease the amount of time that you wait before being seen by our physicians.   If you have a lab appointment with the [facility] please come in thru the Main Entrance and check in at the main information desk.           _____________________________________________________________  Should you have questions after your visit to [facility], please contact our office at [phone] and follow the prompts.  Our office hours are 8:00 a.m. to 4:30 p.m. Monday - Thursday and 8:00 a.m. to 2:30 p.m. Friday.  Please note that voicemails left after 4:00 p.m. may not be returned until the following business day.  We are closed weekends  and all major holidays.  You do have access to a nurse 24-7, just call the main number to the clinic [phone] and do not press any options, hold on the line and a nurse will answer the phone.    For prescription refill requests, have your pharmacy contact our office and allow 72 hours.    Masks are optional in the cancer centers. If you would like for your care team to wear a mask while they are taking care of you, please let them know. You may have one support person who is at least 82 years old accompany you for your appointments.

## 2021-11-14 NOTE — Telephone Encounter (Signed)
Reaching out to patient to offer assistance regarding upcoming cardiac imaging study; pt verbalizes understanding of appt date/time, parking situation and where to check in, pre-test NPO status and medications ordered, and verified current allergies; name and call back number provided for further questions should they arise Daly Whipkey RN Navigator Cardiac Imaging [facility] [phone] office [phone] cell 

## 2021-11-15 DIAGNOSIS — E1122 Type 2 diabetes mellitus with diabetic chronic kidney disease: Secondary | ICD-10-CM | POA: Diagnosis not present

## 2021-11-15 DIAGNOSIS — N186 End stage renal disease: Secondary | ICD-10-CM | POA: Diagnosis not present

## 2021-11-15 DIAGNOSIS — D631 Anemia in chronic kidney disease: Secondary | ICD-10-CM | POA: Diagnosis not present

## 2021-11-15 DIAGNOSIS — N2581 Secondary hyperparathyroidism of renal origin: Secondary | ICD-10-CM | POA: Diagnosis not present

## 2021-11-15 DIAGNOSIS — D509 Iron deficiency anemia, unspecified: Secondary | ICD-10-CM | POA: Diagnosis not present

## 2021-11-15 DIAGNOSIS — Z992 Dependence on renal dialysis: Secondary | ICD-10-CM | POA: Diagnosis not present

## 2021-11-16 DIAGNOSIS — Z992 Dependence on renal dialysis: Secondary | ICD-10-CM | POA: Diagnosis not present

## 2021-11-16 DIAGNOSIS — N186 End stage renal disease: Secondary | ICD-10-CM | POA: Diagnosis not present

## 2021-11-16 DIAGNOSIS — I129 Hypertensive chronic kidney disease with stage 1 through stage 4 chronic kidney disease, or unspecified chronic kidney disease: Secondary | ICD-10-CM | POA: Diagnosis not present

## 2021-11-16 LAB — RHEUMATOID FACTOR: Rheumatoid fact SerPl-aCnc: <10 IU/mL (ref <14.0)

## 2021-11-16 LAB — HAPTOGLOBIN: Haptoglobin: 320 mg/dL (ref 38–329)

## 2021-11-17 ENCOUNTER — Other Ambulatory Visit: Payer: Self-pay | Admitting: Cardiovascular Disease

## 2021-11-17 DIAGNOSIS — I2510 Atherosclerotic heart disease of native coronary artery without angina pectoris: Secondary | ICD-10-CM

## 2021-11-17 LAB — METHYLMALONIC ACID, SERUM: Methylmalonic Acid, Quantitative: 1366 nmol/L — ABNORMAL HIGH (ref 0–378)

## 2021-11-17 LAB — HEPATITIS B CORE ANTIBODY, TOTAL

## 2021-11-17 LAB — ANTINUCLEAR ANTIBODIES, IFA: ANA Ab, IFA: NEGATIVE

## 2021-11-17 LAB — HEPATITIS B SURFACE ANTIGEN

## 2021-11-17 LAB — HEPATITIS B SURFACE ANTIBODY,QUALITATIVE

## 2021-11-17 NOTE — Addendum Note (Signed)
Addended by: Vergia Alcon A on: 11/17/2021 12:30 PM   Modules accepted: Orders

## 2021-11-18 ENCOUNTER — Ambulatory Visit (HOSPITAL_COMMUNITY)
Admission: RE | Admit: 2021-11-18 | Discharge: 2021-11-18 | Disposition: A | Discharge status: Home / Self Care | Payer: Medicare Other | Source: Ambulatory Visit | Attending: Cardiovascular Disease | Admitting: Cardiovascular Disease

## 2021-11-18 DIAGNOSIS — R911 Solitary pulmonary nodule: Secondary | ICD-10-CM | POA: Diagnosis not present

## 2021-11-18 DIAGNOSIS — R072 Precordial pain: Secondary | ICD-10-CM | POA: Diagnosis not present

## 2021-11-18 LAB — PROTEIN ELECTROPHORESIS, SERUM
A/G Ratio: 1.3 (ref 0.7–1.7)
Albumin ELP: 3.7 g/dL (ref 2.9–4.4)
Alpha-1-Globulin: 0.2 g/dL (ref 0.0–0.4)
Alpha-2-Globulin: 0.9 g/dL (ref 0.4–1.0)
Beta Globulin: 0.9 g/dL (ref 0.7–1.3)
Gamma Globulin: 0.8 g/dL (ref 0.4–1.8)
Globulin, Total: 2.9 g/dL (ref 2.2–3.9)
Total Protein ELP: 6.6 g/dL (ref 6.0–8.5)

## 2021-11-18 LAB — H PYLORI, IGM, IGG, IGA AB
H Pylori IgG: 0.24 Index Value (ref 0.00–0.79)
H. Pylogi, Iga Abs: <9 units (ref 0.0–8.9)
H. Pylogi, Igm Abs: <9 units (ref 0.0–8.9)

## 2021-11-18 LAB — NM PET CT CARDIAC PERFUSION MULTI W/ABSOLUTE BLOODFLOW
LV dias vol: 156 mL (ref 62–150)
LV sys vol: 85 mL
MBFR: 0.98
Nuc Rest EF: 46 %
Nuc Stress EF: 40 %
Rest MBF: 0.94 ml/g/min
Rest Nuclear Isotope Dose: 25.2 mCi
ST Depression (mm): 0 mm
Stress MBF: 0.92 ml/g/min
Stress Nuclear Isotope Dose: 25.2 mCi

## 2021-11-18 MED ORDER — RUBIDIUM RB82 GENERATOR (RUBYFILL)
25.3000 | PACK | Freq: Once | INTRAVENOUS | Status: AC
  Administered 2021-11-18: 25.2 via INTRAVENOUS

## 2021-11-18 MED ORDER — RUBIDIUM RB82 GENERATOR (RUBYFILL)
25.2000 | PACK | Freq: Once | INTRAVENOUS | Status: AC
  Administered 2021-11-18: 25.2 via INTRAVENOUS

## 2021-11-18 MED ORDER — REGADENOSON 0.4 MG/5ML IV SOLN: 0.4000 mg | Freq: Once | INTRAVENOUS | Status: AC

## 2021-11-18 MED ORDER — REGADENOSON 0.4 MG/5ML IV SOLN
INTRAVENOUS | Status: AC
  Administered 2021-11-18: 0.4 mg via INTRAVENOUS
  Filled 2021-11-18: qty 5

## 2021-11-19 ENCOUNTER — Telehealth: Payer: Self-pay | Admitting: Cardiovascular Disease

## 2021-11-19 LAB — HEPATITIS C ANTIBODY: HCV Ab: NONREACTIVE — AB

## 2021-11-19 NOTE — Telephone Encounter (Signed)
Wife called to follow-up on results of patient's Cardiac Perfusion test.

## 2021-11-20 ENCOUNTER — Ambulatory Visit (HOSPITAL_COMMUNITY)
Admission: RE | Admit: 2021-11-20 | Discharge: 2021-11-20 | Disposition: A | Discharge status: Home / Self Care | Payer: Medicare Other | Source: Ambulatory Visit | Attending: Nephrology | Admitting: Nephrology

## 2021-11-20 VITALS — BP 125/64 | HR 85 | Temp 98.6°F | Resp 20

## 2021-11-20 DIAGNOSIS — D500 Iron deficiency anemia secondary to blood loss (chronic): Secondary | ICD-10-CM | POA: Insufficient documentation

## 2021-11-20 DIAGNOSIS — D631 Anemia in chronic kidney disease: Secondary | ICD-10-CM | POA: Diagnosis not present

## 2021-11-20 DIAGNOSIS — Z01818 Encounter for other preprocedural examination: Secondary | ICD-10-CM | POA: Diagnosis not present

## 2021-11-20 DIAGNOSIS — Z992 Dependence on renal dialysis: Secondary | ICD-10-CM | POA: Diagnosis not present

## 2021-11-20 DIAGNOSIS — N2581 Secondary hyperparathyroidism of renal origin: Secondary | ICD-10-CM | POA: Diagnosis not present

## 2021-11-20 DIAGNOSIS — N186 End stage renal disease: Secondary | ICD-10-CM | POA: Diagnosis not present

## 2021-11-20 DIAGNOSIS — E1122 Type 2 diabetes mellitus with diabetic chronic kidney disease: Secondary | ICD-10-CM | POA: Diagnosis not present

## 2021-11-20 DIAGNOSIS — Z23 Encounter for immunization: Secondary | ICD-10-CM | POA: Diagnosis not present

## 2021-11-20 LAB — PREPARE RBC (CROSSMATCH)

## 2021-11-20 MED ORDER — SODIUM CHLORIDE 0.9% IV SOLUTION: Freq: Once | INTRAVENOUS | Status: AC

## 2021-11-20 NOTE — Addendum Note (Signed)
Encounter addended by: Terence Lux, RN on: 11/20/2021 4:29 PM  Actions taken: MAR administration accepted

## 2021-11-20 NOTE — Progress Notes (Signed)
Blood Transfusion Record     Product Unit Status Volume Start End          Transfuse RBC    [id number]  23  [id number]  [id number] Stopped 340 mL 11/20/21 1439 11/20/21 1608         1 U PRBC infused per Dr. Marval Regal orders. Patient tolerated well.  Ambulated to wheelchair for discharge. Improvement in BP from 99 systolic to 952 systolic post transfusion.

## 2021-11-21 LAB — TYPE AND SCREEN
ABO/RH(D): O POS
Antibody Screen: NEGATIVE
Unit division: 0

## 2021-11-21 LAB — BPAM RBC
Blood Product Expiration Date: 202311062359
ISSUE DATE / TIME: 202310051429
Unit Type and Rh: 5100

## 2021-11-21 LAB — COPPER, SERUM: Copper: 26 ug/dL — ABNORMAL LOW (ref 69–132)

## 2021-11-21 NOTE — Telephone Encounter (Signed)
Returned call to wife, but no answer. Left detailed message explaining that pt has an appt scheduled w/N[facility]ser on 12/01/21 and this test will be reviewed then, along with decision making regarding needed cath.  Will forward results to current oncologist.

## 2021-11-22 DIAGNOSIS — Z23 Encounter for immunization: Secondary | ICD-10-CM | POA: Diagnosis not present

## 2021-11-22 DIAGNOSIS — N186 End stage renal disease: Secondary | ICD-10-CM | POA: Diagnosis not present

## 2021-11-22 DIAGNOSIS — D631 Anemia in chronic kidney disease: Secondary | ICD-10-CM | POA: Diagnosis not present

## 2021-11-22 DIAGNOSIS — N2581 Secondary hyperparathyroidism of renal origin: Secondary | ICD-10-CM | POA: Diagnosis not present

## 2021-11-22 DIAGNOSIS — E1122 Type 2 diabetes mellitus with diabetic chronic kidney disease: Secondary | ICD-10-CM | POA: Diagnosis not present

## 2021-11-22 DIAGNOSIS — Z992 Dependence on renal dialysis: Secondary | ICD-10-CM | POA: Diagnosis not present

## 2021-11-25 DIAGNOSIS — Z23 Encounter for immunization: Secondary | ICD-10-CM | POA: Diagnosis not present

## 2021-11-25 DIAGNOSIS — Z992 Dependence on renal dialysis: Secondary | ICD-10-CM | POA: Diagnosis not present

## 2021-11-25 DIAGNOSIS — N186 End stage renal disease: Secondary | ICD-10-CM | POA: Diagnosis not present

## 2021-11-25 DIAGNOSIS — N2581 Secondary hyperparathyroidism of renal origin: Secondary | ICD-10-CM | POA: Diagnosis not present

## 2021-11-25 DIAGNOSIS — E1122 Type 2 diabetes mellitus with diabetic chronic kidney disease: Secondary | ICD-10-CM | POA: Diagnosis not present

## 2021-11-25 DIAGNOSIS — D631 Anemia in chronic kidney disease: Secondary | ICD-10-CM | POA: Diagnosis not present

## 2021-11-27 DIAGNOSIS — Z23 Encounter for immunization: Secondary | ICD-10-CM | POA: Diagnosis not present

## 2021-11-27 DIAGNOSIS — D631 Anemia in chronic kidney disease: Secondary | ICD-10-CM | POA: Diagnosis not present

## 2021-11-27 DIAGNOSIS — Z992 Dependence on renal dialysis: Secondary | ICD-10-CM | POA: Diagnosis not present

## 2021-11-27 DIAGNOSIS — N186 End stage renal disease: Secondary | ICD-10-CM | POA: Diagnosis not present

## 2021-11-27 DIAGNOSIS — N2581 Secondary hyperparathyroidism of renal origin: Secondary | ICD-10-CM | POA: Diagnosis not present

## 2021-11-27 DIAGNOSIS — E1122 Type 2 diabetes mellitus with diabetic chronic kidney disease: Secondary | ICD-10-CM | POA: Diagnosis not present

## 2021-11-28 ENCOUNTER — Ambulatory Visit (INDEPENDENT_AMBULATORY_CARE_PROVIDER_SITE_OTHER): Payer: Medicare Other

## 2021-11-28 DIAGNOSIS — I495 Sick sinus syndrome: Secondary | ICD-10-CM

## 2021-11-28 LAB — CUP PACEART REMOTE DEVICE CHECK
Battery Remaining Longevity: 125 mo
Battery Voltage: 3.02 V
Brady Statistic AP VP Percent: 0 %
Brady Statistic AP VS Percent: 4.23 %
Brady Statistic AS VP Percent: 0.04 %
Brady Statistic AS VS Percent: 95.73 %
Brady Statistic RA Percent Paced: 4.64 %
Brady Statistic RV Percent Paced: 0.04 %
Date Time Interrogation Session: 20231012213943
Implantable Lead Implant Date: 20020325
Implantable Lead Implant Date: 20020325
Implantable Lead Location: 753859
Implantable Lead Location: 753860
Implantable Lead Model: 4092
Implantable Lead Model: 4524
Implantable Pulse Generator Implant Date: 20210415
Lead Channel Impedance Value: 247 Ohm
Lead Channel Impedance Value: 304 Ohm
Lead Channel Impedance Value: 456 Ohm
Lead Channel Impedance Value: 627 Ohm
Lead Channel Pacing Threshold Amplitude: 0.875 V
Lead Channel Pacing Threshold Amplitude: 2.5 V
Lead Channel Pacing Threshold Pulse Width: 0.4 ms
Lead Channel Pacing Threshold Pulse Width: 0.4 ms
Lead Channel Sensing Intrinsic Amplitude: 0.5 mV
Lead Channel Sensing Intrinsic Amplitude: 0.5 mV
Lead Channel Sensing Intrinsic Amplitude: 21.875 mV
Lead Channel Sensing Intrinsic Amplitude: 21.875 mV
Lead Channel Setting Pacing Amplitude: 2.5 V
Lead Channel Setting Pacing Amplitude: 3 V
Lead Channel Setting Pacing Pulse Width: 0.4 ms
Lead Channel Setting Sensing Sensitivity: 1.2 mV

## 2021-11-29 DIAGNOSIS — E1122 Type 2 diabetes mellitus with diabetic chronic kidney disease: Secondary | ICD-10-CM | POA: Diagnosis not present

## 2021-11-29 DIAGNOSIS — N186 End stage renal disease: Secondary | ICD-10-CM | POA: Diagnosis not present

## 2021-11-29 DIAGNOSIS — Z23 Encounter for immunization: Secondary | ICD-10-CM | POA: Diagnosis not present

## 2021-11-29 DIAGNOSIS — N2581 Secondary hyperparathyroidism of renal origin: Secondary | ICD-10-CM | POA: Diagnosis not present

## 2021-11-29 DIAGNOSIS — D631 Anemia in chronic kidney disease: Secondary | ICD-10-CM | POA: Diagnosis not present

## 2021-11-29 DIAGNOSIS — Z992 Dependence on renal dialysis: Secondary | ICD-10-CM | POA: Diagnosis not present

## 2021-12-01 ENCOUNTER — Ambulatory Visit: Payer: Medicare Other | Attending: Cardiovascular Disease | Admitting: Cardiovascular Disease

## 2021-12-01 ENCOUNTER — Encounter: Payer: Self-pay | Admitting: Cardiovascular Disease

## 2021-12-01 VITALS — BP 118/55 | HR 93 | Ht 71.0 in | Wt 221.4 lb

## 2021-12-01 DIAGNOSIS — I480 Paroxysmal atrial fibrillation: Secondary | ICD-10-CM | POA: Diagnosis not present

## 2021-12-01 DIAGNOSIS — I200 Unstable angina: Secondary | ICD-10-CM

## 2021-12-01 DIAGNOSIS — I2489 Other forms of acute ischemic heart disease: Secondary | ICD-10-CM | POA: Insufficient documentation

## 2021-12-01 DIAGNOSIS — I2510 Atherosclerotic heart disease of native coronary artery without angina pectoris: Secondary | ICD-10-CM | POA: Diagnosis not present

## 2021-12-01 NOTE — Patient Instructions (Signed)
Medication Instructions:   *If you need a refill on your cardiac medications before your next appointment, please call your pharmacy*   Lab Work:  If you have labs (blood work) drawn today and your tests are completely normal, you will receive your results only by: [facility] (if you have MyChart) OR A paper copy in the mail If you have any lab test that is abnormal or we need to change your treatment, we will call you to review the results.   Testing/Procedures:    Follow-Up: At [facility], you and your health needs are our priority.  As part of our continuing mission to provide you with exceptional heart care, we have created designated Provider Care Teams.  These Care Teams include your primary Cardiologist (physician) and Advanced Practice Providers (APPs -  Physician Assistants and Nurse Practitioners) who all work together to provide you with the care you need, when you need it.  We recommend signing up for the patient portal called "MyChart".  Sign up information is provided on this After Visit Summary.  MyChart is used to connect with patients for Virtual Visits (Telemedicine).  Patients are able to view lab/test results, encounter notes, upcoming appointments, etc.  Non-urgent messages can be sent to your provider as well.   To learn more about what you can do with MyChart, go to [email or web address].    Your next appointment:   6 month(s)  The format for your next appointment:   In Person  Provider:   Mertie Moores, MD     Other Instructions   Important Information About Sugar

## 2021-12-01 NOTE — Progress Notes (Signed)
Bradley Soto Date of Birth  [birth year]       [facility]    [employer] [address] [place], [place], [place]  [address]   [place], [place]  [address] [place]   Fax  [phone]     Fax [phone]  Problem List: 1. Coronary artery disease-status post stenting 2. Sick sinus syndrome-status post pacemaker. History of paroxysmal  atrial fib 3. CKD 4. Diabetes Mellitus 5. Gout 6.   Previoius Hx  Pt recently moved from [place], [place] ( near [employer])  Here to establish cardiology care. 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    March 25, 2017: Bradley Soto is seen today for a follow-up visit.  I last saw him 4 years ago.  He has been seeing Dr. Rayann Heman in the meantime.  Hx of stenting in the past  Was just in the hospital - had the flu.  ( did get the flu shot)  Echocardiogram while in the hospital shows normal left ventricular systolic function with ejection fraction of 60-65%.  He has mild left ventricular hypertrophy.  There is akinesis of the inferior basilar segment.  He has grade 1 diastolic.  dysfunction.  There is mild aortic stenosis.  Now has  No CP . Has some shortness of breath ,  Worse in the cold.   Has gained some weight - since being on Insulin   Jan. 29, 2020   Doing well  He describes having some palpitations that lasted about a month.  He thinks they started around mid December and lasted through the first part of January. It is him as needed for these palpitations.  They seem to have resolved.  He was wondering if they are related to his Tylenol PM. Today shows atrial pacing.  There is no arrhythmias. Is not exercising   Oct. 12, 2020   Bradley Soto is seen today for follow up . Hx of previous CAD and previous coronary stenting .   Darrik was seen in the ER on Oct. 8 but left the waiting room because of prolonged wait time. Has mid  sternal chest pain . Seems worse with movement,  No radiation.  Did not have NTG to take   CP started a week ago .    Has occurred 3-4 times.  Woke up with CP last night Typically occurs at night while sleeping .  Does have CP while walking to the mailbox   Will last for several minutes.   Center of chest,  Radiates to his throat.  This pain is very similar to his previous episodes of CP  Pain was relieved with SL NTG   Not associated with deep breath , not associated with twisting his body .    December 23, 2018: Bradley Soto is seen today for a follow-up visit.  He was having some chest pain when I saw him last month and we referred him for heart catheterization.  Ramus lesion is 99% stenosed. Prox LAD to Mid LAD lesion is 70% stenosed. Dist Cx lesion is 60% stenosed. Mid RCA lesion is 95% stenosed. 3rd Mrg-1 lesion is 80% stenosed. 3rd Mrg-2 lesion is 99% stenosed. 1st LPL lesion is 99% stenosed.   1. Severe multi-vessel CAD 2. Moderately severe mid LAD stenosis 3. Severe restenosis of the stented segment in the proximal segment of the ramus intermediate branch 4. Severe restenosis  mid Circumflex/obtuse marginal stented segment. Severe stenosis in the first obtuse marginal branch beyond the stented segment. Severe stenosis distal Circumflex leading into the moderate caliber second obtuse marginal branch.  5. Severe stenosis mid RCA   Had coronary artery bypass grafting-LIMA to LAD, SVG to RI  - OM3, SVG to D1 Oct. 19, 2020  The distal right coronary artery/posterior descending branch was evaluated but was considered to be too small for bypass grafting. Is healing   He has had some recurrent pleural effusions and had a thoracentesis 2 days ago.   1.4 liters   The procedure was done in special procedures/interventional radiology on November 4.  He had some postoperative atrial fibrillation and is now on amiodarone  He was previously on amlodipine 2.5 mg a day, hydralazine 25 mg 3  times a day, isosorbide 30 mg a day and Eliquis.  These medications have been discontinued.  His simvastatin was discontinued but now he is on Crestor 20 mg a day.  January 25, 2019:  Bradley Soto is seen today for follow-up visit.  He has a history of coronary artery disease and is status post coronary artery bypass grafting ( Oct. 19,. 2020 )   He has a pacemaker. Breathing is going ok Slight DOE ,  No fever,    No angina    Feb. 17, 2023 Bradley Soto is seen today for follow up of his CAD, CABG Is on HD now for the past 1.5 years  occasional has hypotension with HD  Is developing peripheral neuropathy in his feet LDL is 16    Aug. 23, 2023  Bradley Soto is seen today  for CAD/ CABG  Has been on HD Having some CP / low level pain  with walking  Similar to his previous angina   He has chest discomfort/pressure walking from his car into the office.  We will schedule him for a cardiac PET scan. We will refer him for heart catheterization if the cardiac PET scan revealed significant ischemia.  Had moderate carotid stenosis of R ICA mild stenosis of L ICA  Will recheck cardotid duplex.   Sept. 6, 2023  Bradley Soto is seen today for recurrent CP / dyspnea  Has been getting dialysis for 2 years Is very anemic .  Hb is 7.5  Is getting iron , epo .   Takes midodrine for dialysis related hypotension  - doesn't work very well according   The plan is for him to get a transfusion - possibly at dialysis  If he is still symptomatic after getting his Hb up, then he will need a cardiac cath    Oct. 16, 2023   Bradley Soto is seen today  Still has some DOE .  Has had 2 transfusions - his last transfutsion was ( sept 7, then end of Sept.)  He symptoms of angina improved significantly with the transfusion.   He thinks he is having more symptoms over the past several days and suspects that he is becoming more anemic .     Current Outpatient Medications  Medication Sig Dispense Refill   allopurinol (ZYLOPRIM)  100 MG tablet Take 1 tablet (100 mg total) by mouth daily. 30 tablet 3   aspirin EC 81 MG tablet Take 81 mg by mouth daily.     B Complex-C-Zn-Folic Acid (DIALYVITE 962 WITH ZINC) 0.8 MG TABS Take 1 tablet by mouth at bedtime.     CALCITRIOL PO Take 4-5 tablets by mouth See admin instructions. Patient takes 4-5 calcitrol vitamin d  tablets (1.'75mg'$  per patient's spouse) during dialysis (T, TH, Sat).     gabapentin (NEURONTIN) 100 MG capsule Take 1 capsule (100 mg total) by mouth 3 (three) times daily. 90 capsule 3   iron sucrose in sodium chloride 0.9 % 100 mL once a week.     Methoxy PEG-Epoetin Beta (MIRCERA IJ) Every two weeks at dialysis     nitroGLYCERIN (NITROSTAT) 0.4 MG SL tablet Place 1 tablet (0.4 mg total) under the tongue every 5 (five) minutes as needed for chest pain. 25 tablet 3   NOVOLOG MIX 70/30 FLEXPEN (70-30) 100 UNIT/ML FlexPen Inject 30 Units into the skin daily. 30 unit Am and 30 units PM  3   rosuvastatin (CRESTOR) 5 MG tablet Take 5 mg by mouth every other day. Patient takes on Mondays, Wednesdays, Fridays and Sundays.     sevelamer carbonate (RENVELA) 800 MG tablet Take 1 tablet (800 mg total) by mouth 3 (three) times daily with meals. 90 tablet 3   HYDROcodone-acetaminophen (NORCO) 5-325 MG tablet Take 1 tablet by mouth every 6 (six) hours as needed for moderate pain. (Patient not taking: Reported on 12/01/2021) 10 tablet 0   midodrine (PROAMATINE) 5 MG tablet TAKE 1 TABLET BY MOUTH THREE TIMES A WEEK AS DIRECTED ON DIALYSIS DAYS ONLY(T/TH/SAT) TAKE 1 TABLET 30 MINUTES PRIOR TO HD TREATMENT. BRING BOTTLE TO UNIT. MAY TAKE ADDITIONAL 1 TABLET HALFWAY THROUGH HD TREATMENT UNDER DIRECTION OF THE RN FOR SBP <100 (Patient not taking: Reported on 12/01/2021)     ondansetron (ZOFRAN) 4 MG tablet Take 1 tablet (4 mg total) by mouth every 6 (six) hours as needed for nausea. (Patient not taking: Reported on 12/01/2021) 30 tablet 1   No current facility-administered medications for this  visit.     Allergies  Allergen Reactions   Other Other (See Comments)   Shellfish Allergy Rash    Past Medical History:  Diagnosis Date   Anxiety    Atrial fibrillation (HCC)    Bell's palsy    left side of face droops   CAD (coronary artery disease)    s/p stent placement   CKD (chronic kidney disease), stage III ([place])    [place]- [facility]- [facility]   Colon polyps    Diabetes mellitus without complication (HCC)    Gout    History of blood transfusion    History of kidney stones    Passes   HLD (hyperlipidemia)    HTN (hypertension)    Insomnia    MI (myocardial infarction) ([place]) 1998   Neuropathy    NSTEMI (non-ST elevated myocardial infarction) ([place])    s/p PCI in early 2000s   OSA on CPAP    wears CPAP   Presence of permanent cardiac pacemaker    SSS (sick sinus syndrome) (HCC)    Type II or unspecified type diabetes mellitus without mention of complication, not stated as uncontrolled    Wears glasses    reading    Wears partial dentures     Past Surgical History:  Procedure Laterality Date   APPENDECTOMY     AV FISTULA PLACEMENT Right 07/28/2019   Procedure: RIGHT ARM Brachiocephalic ARTERIOVENOUS (AV) FISTULA CREATION;  Surgeon: Serafina Mitchell, MD;  Location: [facility] OR;  Service: Vascular;  Laterality: Right;   CATARACT EXTRACTION     CLIPPING OF ATRIAL APPENDAGE  12/05/2018   Procedure: Clipping Of Atrial Appendage;  Surgeon: Wonda Olds, MD;  Location: [facility] OR;  Service: Open Heart Surgery;;   CORONARY ANGIOPLASTY  WITH STENT PLACEMENT     x4   CORONARY ARTERY BYPASS GRAFT N/A 12/05/2018   Procedure: CORONARY ARTERY BYPASS GRAFTING (CABG) X FOUR USING LEFT INTERNAL MAMMARY ARTERY AND RIGHT SAPHENOUS VEIN GRAFTS;  Surgeon: Wonda Olds, MD;  Location: [place];  Service: Open Heart Surgery;  Laterality: N/A;   HERNIA REPAIR     ventral hernia repair with mesh--revision also was required   INSERT / REPLACE / REMOVE PACEMAKER  05/10/2000   pacemaker  implanted in [place]]   IR THORACENTESIS ASP PLEURAL SPACE W/IMG GUIDE  12/21/2018   IR THORACENTESIS ASP PLEURAL SPACE W/IMG GUIDE  12/30/2018   LEFT HEART CATH AND CORONARY ANGIOGRAPHY N/A 11/30/2018   Procedure: LEFT HEART CATH AND CORONARY ANGIOGRAPHY;  Surgeon: Burnell Blanks, MD;  Location: [facility];  Service: Cardiovascular;  Laterality: N/A;   MAZE N/A 12/05/2018   Procedure: MAZE;  Surgeon: Wonda Olds, MD;  Location: [place];  Service: Open Heart Surgery;  Laterality: N/A;   PACEMAKER GENERATOR CHANGE  09/03/09   MDT Adapta generator change in [place]  2/2   polyp that could not be removed on colonoscopy   PPM GENERATOR CHANGEOUT N/A 06/01/2019   Procedure: PPM GENERATOR CHANGEOUT;  Surgeon: [name], MD;  Location: [facility];  Service: Cardiovascular;  Laterality: N/A;   REVISON OF ARTERIOVENOUS FISTULA Right 01/24/2020   Procedure: BANDING OF RIGHT ARM ARTERIOVENOUS FISTULA;  Surgeon: Serafina Mitchell, MD;  Location: [facility];  Service: Vascular;  Laterality: Right;   TEE WITHOUT CARDIOVERSION N/A 12/05/2018   Procedure: TRANSESOPHAGEAL ECHOCARDIOGRAM (TEE);  Surgeon: Wonda Olds, MD;  Location: [facility];  Service: Open Heart Surgery;  Laterality: N/A;    Social History   Tobacco Use  Smoking Status Former   Types: Cigarettes  Smokeless Tobacco Never  Tobacco Comments   " many years ago"    Social History   Substance and Sexual Activity  Alcohol Use No    Family History  Problem Relation Age of Onset   Heart attack Father 63   Heart disease Father    Breast cancer Sister    Pulmonary embolism Sister    Deep vein thrombosis Sister     Reviw of Systems:  Reviewed in the HPI.  All other systems are negative.  Physical Exam: Blood pressure (!) 118/55, pulse 93, height '5\' 11"'$  (1.803 m), weight 221 lb 6.4 oz (100.4 kg), SpO2 95 %.       GEN:    frail, elderly manin no acute distress HEENT:  Normal NECK: No JVD; bilat carotid bruits LYMPHATICS: No lymphadenopathy CARDIAC: RRR , 3/6 systolic murmur  RESPIRATORY:  Clear to auscultation without rales, wheezing or rhonchi  ABDOMEN: Soft, non-tender, non-distended MUSCULOSKELETAL:  No edema; No deformity , RUE dialysis fistula  SKIN: Warm and dry NEUROLOGIC:  Alert and oriented x 3    ECG:   Assessment / Plan:   1.  Coronary artery disease: Had a heart catheterization which revealed severe three-vessel coronary artery disease.  He had coronary artery bypass grafting to his LAD, circumflex artery, diagonal vessel.  The right coronary artery was too small to consider bypassing.  He has symptoms when his hemoglobin drops.  At this point I think most of his symptoms are related to this anemia.  I do not think that he needs a repeat heart catheterization.  As long as his symptoms can be trolled with transfusion I think that  we should hold off on repeat heart catheterization.    2.  Chronic diastolic congestive heart failure: : Volume is managed by dialysis.    3.  Hypertension:   Blood pressure is well controlled after second check.     4.  ESRD :      5.  Paroxysmal atrial fibrillation:    He has chronic anemia.  He is not on anticoagulation.   6.  Carotid artery disease:     Mertie Moores, MD  12/01/2021 9:46 AM    [facility],  [place], [place]  [address] Pager [phone] Phone: [phone]; Fax: [phone]) [phone]   .

## 2021-12-02 DIAGNOSIS — Z992 Dependence on renal dialysis: Secondary | ICD-10-CM | POA: Diagnosis not present

## 2021-12-02 DIAGNOSIS — N186 End stage renal disease: Secondary | ICD-10-CM | POA: Diagnosis not present

## 2021-12-02 DIAGNOSIS — Z23 Encounter for immunization: Secondary | ICD-10-CM | POA: Diagnosis not present

## 2021-12-02 DIAGNOSIS — E1122 Type 2 diabetes mellitus with diabetic chronic kidney disease: Secondary | ICD-10-CM | POA: Diagnosis not present

## 2021-12-02 DIAGNOSIS — N2581 Secondary hyperparathyroidism of renal origin: Secondary | ICD-10-CM | POA: Diagnosis not present

## 2021-12-02 DIAGNOSIS — D631 Anemia in chronic kidney disease: Secondary | ICD-10-CM | POA: Diagnosis not present

## 2021-12-03 ENCOUNTER — Ambulatory Visit (HOSPITAL_COMMUNITY)
Admission: RE | Admit: 2021-12-03 | Discharge: 2021-12-03 | Disposition: A | Discharge status: Home / Self Care | Payer: Medicare Other | Source: Ambulatory Visit | Attending: Hematology | Admitting: Hematology

## 2021-12-03 DIAGNOSIS — D696 Thrombocytopenia, unspecified: Secondary | ICD-10-CM | POA: Insufficient documentation

## 2021-12-03 DIAGNOSIS — D509 Iron deficiency anemia, unspecified: Secondary | ICD-10-CM | POA: Insufficient documentation

## 2021-12-03 NOTE — Progress Notes (Signed)
Remote pacemaker transmission.   

## 2021-12-04 DIAGNOSIS — Z23 Encounter for immunization: Secondary | ICD-10-CM | POA: Diagnosis not present

## 2021-12-04 DIAGNOSIS — E039 Hypothyroidism, unspecified: Secondary | ICD-10-CM | POA: Diagnosis not present

## 2021-12-04 DIAGNOSIS — N186 End stage renal disease: Secondary | ICD-10-CM | POA: Diagnosis not present

## 2021-12-04 DIAGNOSIS — Z992 Dependence on renal dialysis: Secondary | ICD-10-CM | POA: Diagnosis not present

## 2021-12-04 DIAGNOSIS — N2581 Secondary hyperparathyroidism of renal origin: Secondary | ICD-10-CM | POA: Diagnosis not present

## 2021-12-04 DIAGNOSIS — E1122 Type 2 diabetes mellitus with diabetic chronic kidney disease: Secondary | ICD-10-CM | POA: Diagnosis not present

## 2021-12-04 DIAGNOSIS — D631 Anemia in chronic kidney disease: Secondary | ICD-10-CM | POA: Diagnosis not present

## 2021-12-06 DIAGNOSIS — D631 Anemia in chronic kidney disease: Secondary | ICD-10-CM | POA: Diagnosis not present

## 2021-12-06 DIAGNOSIS — Z992 Dependence on renal dialysis: Secondary | ICD-10-CM | POA: Diagnosis not present

## 2021-12-06 DIAGNOSIS — N2581 Secondary hyperparathyroidism of renal origin: Secondary | ICD-10-CM | POA: Diagnosis not present

## 2021-12-06 DIAGNOSIS — Z23 Encounter for immunization: Secondary | ICD-10-CM | POA: Diagnosis not present

## 2021-12-06 DIAGNOSIS — N186 End stage renal disease: Secondary | ICD-10-CM | POA: Diagnosis not present

## 2021-12-06 DIAGNOSIS — E1122 Type 2 diabetes mellitus with diabetic chronic kidney disease: Secondary | ICD-10-CM | POA: Diagnosis not present

## 2021-12-08 ENCOUNTER — Ambulatory Visit: Payer: Medicare Other | Admitting: Physician Assistant

## 2021-12-09 DIAGNOSIS — N2581 Secondary hyperparathyroidism of renal origin: Secondary | ICD-10-CM | POA: Diagnosis not present

## 2021-12-09 DIAGNOSIS — D631 Anemia in chronic kidney disease: Secondary | ICD-10-CM | POA: Diagnosis not present

## 2021-12-09 DIAGNOSIS — N186 End stage renal disease: Secondary | ICD-10-CM | POA: Diagnosis not present

## 2021-12-09 DIAGNOSIS — E1122 Type 2 diabetes mellitus with diabetic chronic kidney disease: Secondary | ICD-10-CM | POA: Diagnosis not present

## 2021-12-09 DIAGNOSIS — Z992 Dependence on renal dialysis: Secondary | ICD-10-CM | POA: Diagnosis not present

## 2021-12-09 DIAGNOSIS — Z23 Encounter for immunization: Secondary | ICD-10-CM | POA: Diagnosis not present

## 2021-12-11 DIAGNOSIS — N186 End stage renal disease: Secondary | ICD-10-CM | POA: Diagnosis not present

## 2021-12-11 DIAGNOSIS — E1122 Type 2 diabetes mellitus with diabetic chronic kidney disease: Secondary | ICD-10-CM | POA: Diagnosis not present

## 2021-12-11 DIAGNOSIS — Z23 Encounter for immunization: Secondary | ICD-10-CM | POA: Diagnosis not present

## 2021-12-11 DIAGNOSIS — N2581 Secondary hyperparathyroidism of renal origin: Secondary | ICD-10-CM | POA: Diagnosis not present

## 2021-12-11 DIAGNOSIS — Z992 Dependence on renal dialysis: Secondary | ICD-10-CM | POA: Diagnosis not present

## 2021-12-11 DIAGNOSIS — D631 Anemia in chronic kidney disease: Secondary | ICD-10-CM | POA: Diagnosis not present

## 2021-12-13 DIAGNOSIS — E1122 Type 2 diabetes mellitus with diabetic chronic kidney disease: Secondary | ICD-10-CM | POA: Diagnosis not present

## 2021-12-13 DIAGNOSIS — Z992 Dependence on renal dialysis: Secondary | ICD-10-CM | POA: Diagnosis not present

## 2021-12-13 DIAGNOSIS — N2581 Secondary hyperparathyroidism of renal origin: Secondary | ICD-10-CM | POA: Diagnosis not present

## 2021-12-13 DIAGNOSIS — D631 Anemia in chronic kidney disease: Secondary | ICD-10-CM | POA: Diagnosis not present

## 2021-12-13 DIAGNOSIS — N186 End stage renal disease: Secondary | ICD-10-CM | POA: Diagnosis not present

## 2021-12-13 DIAGNOSIS — Z23 Encounter for immunization: Secondary | ICD-10-CM | POA: Diagnosis not present

## 2021-12-13 NOTE — Progress Notes (Unsigned)
[place], [place] [address]   CLINIC:  Medical Oncology/Hematology  PCP:  [name], [place] [address] [phone]   REASON FOR VISIT:  Follow-up for macrocytic anemia  PRIOR THERAPY: ***  CURRENT THERAPY: ***  INTERVAL HISTORY:  Bradley Soto 81 y.o. male returns for routine follow-up of ***  At today's visit, he reports feeling ***.  No recent hospitalizations, surgeries, or changes in baseline health status.  *** No fatigue, fever, chills, shortness of breath, cough, chest pain, nausea, vomiting, abdominal pain.  Denies any signs or symptoms of blood loss.  No current signs or symptoms of blood clots.  He has ***% energy and ***% appetite. He endorses that he is maintaining a stable weight.    REVIEW OF SYSTEMS:  Review of Systems - Oncology    PAST MEDICAL/SURGICAL HISTORY:  Past Medical History:  Diagnosis Date  . Anxiety   . Atrial fibrillation ([place])   . Bell's palsy    left side of face droops  . CAD (coronary artery disease)    s/p stent placement  . CKD (chronic kidney disease), stage III ([place])    [place]- [facility]Sat- [facility]  . Colon polyps   . Diabetes mellitus without complication ([place])   . Gout   . History of blood transfusion   . History of kidney stones    Passes  . HLD (hyperlipidemia)   . HTN (hypertension)   . Insomnia   . MI (myocardial infarction) ([place]) 1998  . Neuropathy   . NSTEMI (non-ST elevated myocardial infarction) ([place])    s/p PCI in early 2000s  . OSA on CPAP    wears CPAP  . Presence of permanent cardiac pacemaker   . SSS (sick sinus syndrome) ([place])   . Type II or unspecified type diabetes mellitus without mention of complication, not stated as uncontrolled   . Wears glasses    reading   . Wears partial dentures    Past Surgical History:  Procedure Laterality Date  . APPENDECTOMY    . AV FISTULA PLACEMENT Right 07/28/2019   Procedure: RIGHT ARM  Brachiocephalic ARTERIOVENOUS (AV) FISTULA CREATION;  Surgeon: Serafina Mitchell, MD;  Location: [facility];  Service: Vascular;  Laterality: Right;  . CATARACT EXTRACTION    . CLIPPING OF ATRIAL APPENDAGE  12/05/2018   Procedure: Clipping Of Atrial Appendage;  Surgeon: Wonda Olds, MD;  Location[facility];  Service: Open Heart Surgery;;  . CORONARY ANGIOPLASTY WITH STENT PLACEMENT     x4  . CORONARY ARTERY BYPASS GRAFT N/A 12/05/2018   Procedure: CORONARY ARTERY BYPASS GRAFTING (CABG) X FOUR USING LEFT INTERNAL MAMMARY ARTERY AND RIGHT SAPHENOUS VEIN GRAFTS;  Surgeon: Wonda Olds, MD;  Location: [facility];  Service: Open Heart Surgery;  Laterality: N/A;  . HERNIA REPAIR     ventral hernia repair with mesh--revision also was required  . INSERT / REPLACE / REMOVE PACEMAKER  05/10/2000   pacemaker implanted in [place]  . IR THORACENTESIS ASP PLEURAL SPACE W/IMG GUIDE  12/21/2018  . IR THORACENTESIS ASP PLEURAL SPACE W/IMG GUIDE  12/30/2018  . LEFT HEART CATH AND CORONARY ANGIOGRAPHY N/A 11/30/2018   Procedure: LEFT HEART CATH AND CORONARY ANGIOGRAPHY;  Surgeon: Burnell Blanks, MD;  Location: [facility];  Service: Cardiovascular;  Laterality: N/A;  . MAZE N/A 12/05/2018   Procedure: MAZE;  Surgeon: Wonda Olds, MD;  Location: [facility];  Service: Open Heart Surgery;  Laterality: N/A;  . PACEMAKER GENERATOR CHANGE  09/03/09   MDT Adapta generator change in [place]  . PARTIAL COLON REMOVAL  2/2   polyp that could not be removed on colonoscopy  . PPM GENERATOR CHANGEOUT N/A 06/01/2019   Procedure: PPM GENERATOR CHANGEOUT;  Surgeon: [name], MD;  Location: [facility];  Service: Cardiovascular;  Laterality: N/A;  . REVISON OF ARTERIOVENOUS FISTULA Right 01/24/2020   Procedure: BANDING OF RIGHT ARM ARTERIOVENOUS FISTULA;  Surgeon: Serafina Mitchell, MD;  Location[facility];  Service: Vascular;  Laterality: Right;  . TEE WITHOUT CARDIOVERSION N/A 12/05/2018   Procedure:  TRANSESOPHAGEAL ECHOCARDIOGRAM (TEE);  Surgeon: Wonda Olds, MD;  Location: [facility];  Service: Open Heart Surgery;  Laterality: N/A;     SOCIAL HISTORY:  Social History   Socioeconomic History  . Marital status: Married    Spouse name: Not on file  . Number of children: Not on file  . Years of education: Not on file  . Highest education level: Not on file  Occupational History  . Not on file  Tobacco Use  . Smoking status: Former    Types: Cigarettes  . Smokeless tobacco: Never  . Tobacco comments:    " many years ago"  Vaping Use  . Vaping Use: Never used  Substance and Sexual Activity  . Alcohol use: No  . Drug use: No  . Sexual activity: Not on file  Other Topics Concern  . 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   Social Determinants of Health   Financial Resource Strain: Not on file  Food Insecurity: Not on file  Transportation Needs: Not on file  Physical Activity: Not on file  Stress: Not on file  Social Connections: Not on file  Intimate Partner Violence: Not on file    FAMILY HISTORY:  Family History  Problem Relation Age of Onset  . Heart attack Father 72  . Heart disease Father   . Breast cancer Sister   . Pulmonary embolism Sister   . Deep vein thrombosis Sister     CURRENT MEDICATIONS:  Outpatient Encounter Medications as of 12/15/2021  Medication Sig  . allopurinol (ZYLOPRIM) 100 MG tablet Take 1 tablet (100 mg total) by mouth daily.  Marland Kitchen aspirin EC 81 MG tablet Take 81 mg by mouth daily.  . B Complex-C-Zn-Folic Acid (DIALYVITE 122 WITH ZINC) 0.8 MG TABS Take 1 tablet by mouth at bedtime.  Marland Kitchen CALCITRIOL PO Take 4-5 tablets by mouth See admin instructions. Patient takes 4-5 calcitrol vitamin d tablets (1.'75mg'$  per patient's spouse) during dialysis (T, TH, Sat).  . gabapentin (NEURONTIN) 100 MG capsule Take 1 capsule (100 mg total) by mouth 3 (three) times  daily.  Marland Kitchen HYDROcodone-acetaminophen (NORCO) 5-325 MG tablet Take 1 tablet by mouth every 6 (six) hours as needed for moderate pain. (Patient not taking: Reported on 12/01/2021)  . iron sucrose in sodium chloride 0.9 % 100 mL once a week.  . Methoxy PEG-Epoetin Beta (MIRCERA IJ) Every two weeks at dialysis  . midodrine (PROAMATINE) 5 MG tablet TAKE 1 TABLET BY MOUTH THREE TIMES A WEEK AS DIRECTED ON DIALYSIS DAYS ONLY(T/TH/SAT) TAKE 1 TABLET 30 MINUTES PRIOR TO HD TREATMENT. BRING BOTTLE TO UNIT. MAY TAKE ADDITIONAL 1 TABLET HALFWAY THROUGH HD TREATMENT UNDER DIRECTION OF THE RN FOR SBP <100 (Patient not taking: Reported on 12/01/2021)  . nitroGLYCERIN (NITROSTAT)  0.4 MG SL tablet Place 1 tablet (0.4 mg total) under the tongue every 5 (five) minutes as needed for chest pain.  Marland Kitchen NOVOLOG MIX 70/30 FLEXPEN (70-30) 100 UNIT/ML FlexPen Inject 30 Units into the skin daily. 30 unit Am and 30 units PM  . ondansetron (ZOFRAN) 4 MG tablet Take 1 tablet (4 mg total) by mouth every 6 (six) hours as needed for nausea. (Patient not taking: Reported on 12/01/2021)  . rosuvastatin (CRESTOR) 5 MG tablet Take 5 mg by mouth every other day. Patient takes on Mondays, Wednesdays, Fridays and Sundays.  . sevelamer carbonate (RENVELA) 800 MG tablet Take 1 tablet (800 mg total) by mouth 3 (three) times daily with meals.   No facility-administered encounter medications on file as of 12/15/2021.    ALLERGIES:  Allergies  Allergen Reactions  . Other Other (See Comments)  . Shellfish Allergy Rash     PHYSICAL EXAM:  ECOG PERFORMANCE STATUS: {CHL ONC ECOG PS:[phone]}  There were no vitals filed for this visit. There were no vitals filed for this visit. Physical Exam   LABORATORY DATA:  I have reviewed the labs as listed.  CBC    Component Value Date/Time   WBC 7.2 11/14/2021 0927   RBC 2.43 (L) 11/14/2021 0927   RBC 2.48 (L) 11/14/2021 0927   HGB 7.7 (L) 11/14/2021 0927   HGB 12.5 (L) 05/30/2019 0923    HCT 25.0 (L) 11/14/2021 0927   HCT 38.8 05/30/2019 0923   PLT 116 (L) 11/14/2021 0927   PLT 136 (L) 05/30/2019 0923   MCV 102.9 (H) 11/14/2021 0927   MCV 95 05/30/2019 0923   MCH 31.7 11/14/2021 0927   MCHC 30.8 11/14/2021 0927   RDW 15.9 (H) 11/14/2021 0927   RDW 16.3 (H) 05/30/2019 0923   LYMPHSABS 0.9 11/14/2021 0927   LYMPHSABS 1.7 05/30/2019 0923   MONOABS 0.5 11/14/2021 0927   EOSABS 0.2 11/14/2021 0927   EOSABS 0.5 (H) 05/30/2019 0923   BASOSABS 0.0 11/14/2021 0927   BASOSABS 0.0 05/30/2019 0923      Latest Ref Rng & Units 10/24/2021    7:28 AM 01/24/2020    7:06 AM 11/03/2019    5:33 AM  CMP  Glucose 70 - 99 mg/dL 293  139  196   BUN 8 - 23 mg/dL 40  40  75   Creatinine 0.61 - 1.24 mg/dL 6.13  3.20  6.51   Sodium 135 - 145 mmol/L 139  140  139   Potassium 3.5 - 5.1 mmol/L 4.2  4.4  3.9   Chloride 98 - 111 mmol/L 97  102  108   CO2 22 - 32 mmol/L 28   22   Calcium 8.9 - 10.3 mg/dL 9.1   7.1   Total Protein 6.5 - 8.1 g/dL 6.9     Total Bilirubin 0.3 - 1.2 mg/dL 0.9     Alkaline Phos 38 - 126 U/L 87     AST 15 - 41 U/L 25     ALT 0 - 44 U/L 35       DIAGNOSTIC IMAGING:  I have independently reviewed the relevant imaging and discussed with the patient.  ASSESSMENT & PLAN: 1.  Normocytic to macrocytic anemia - Patient seen at the request of Dr. Marval Regal. - Patient is on hemodialysis for ESRD.  Per nephrology notes, patient is on maximum ESA, exact dose unspecified.  *** - Mild to moderate anemia since October 2020 - Last colonoscopy was 10 years ago in [place]  [place], [place] *** - PLAN: ***  2.  *** - *** - PLAN: ***  PLAN SUMMARY & DISPOSITION: ***  All questions were answered. The patient knows to call the clinic with any problems, questions or concerns.  Medical decision making: ***  Time spent on visit: I spent *** minutes counseling the patient face to face. The total time spent in the appointment was *** minutes and more than 50% was on  counseling.   Harriett Rush, PA-C  ***

## 2021-12-15 ENCOUNTER — Inpatient Hospital Stay: Payer: Medicare Other

## 2021-12-15 ENCOUNTER — Inpatient Hospital Stay: Payer: Medicare Other | Attending: Physician Assistant | Admitting: Physician Assistant

## 2021-12-15 VITALS — BP 138/66 | HR 87 | Temp 99.3°F | Resp 18 | Wt 219.0 lb

## 2021-12-15 DIAGNOSIS — D696 Thrombocytopenia, unspecified: Secondary | ICD-10-CM | POA: Diagnosis not present

## 2021-12-15 DIAGNOSIS — N186 End stage renal disease: Secondary | ICD-10-CM

## 2021-12-15 DIAGNOSIS — D631 Anemia in chronic kidney disease: Secondary | ICD-10-CM | POA: Diagnosis not present

## 2021-12-15 DIAGNOSIS — E610 Copper deficiency: Secondary | ICD-10-CM

## 2021-12-15 DIAGNOSIS — E538 Deficiency of other specified B group vitamins: Secondary | ICD-10-CM | POA: Diagnosis not present

## 2021-12-15 MED ORDER — VITAMIN B-12 1000 MCG PO TABS: 1000.0000 ug | ORAL_TABLET | Freq: Every day | ORAL | 0 refills | Status: DC

## 2021-12-15 MED ORDER — COPPER 5 MG PO TABS: 5.0000 mg | ORAL_TABLET | Freq: Every day | ORAL | 0 refills | Status: DC

## 2021-12-15 MED ORDER — CYANOCOBALAMIN 1000 MCG/ML IJ SOLN
1000.0000 ug | Freq: Once | INTRAMUSCULAR | Status: AC
  Administered 2021-12-15: 1000 ug via INTRAMUSCULAR

## 2021-12-15 NOTE — Progress Notes (Unsigned)
Patient in clinic for Follow up visit. B12 injection ordered by physician. See MAR for injection information. Patient remained stable throughout injection. Patient discharged from clinic in wheelchair with wife in stable condition.

## 2021-12-15 NOTE — Patient Instructions (Signed)
[facility]  Discharge Instructions: Thank you for choosing [facility] to provide your oncology and hematology care.  If you have a lab appointment with the Cancer Center, please come in thru the Main Entrance and check in at the main information desk.  Wear comfortable clothing and clothing appropriate for easy access to any Portacath or PICC line.   We strive to give you quality time with your provider. You may need to reschedule your appointment if you arrive late (15 or more minutes).  Arriving late affects you and other patients whose appointments are after yours.  Also, if you miss three or more appointments without notifying the office, you may be dismissed from the clinic at the provider's discretion.      For prescription refill requests, have your pharmacy contact our office and allow 72 hours for refills to be completed.    Today you received the following chemotherapy and/or immunotherapy agents B12 injection.      To help prevent nausea and vomiting after your treatment, we encourage you to take your nausea medication as directed.  BELOW ARE SYMPTOMS THAT SHOULD BE REPORTED IMMEDIATELY: *FEVER GREATER THAN 100.4 F (38 C) OR HIGHER *CHILLS OR SWEATING *NAUSEA AND VOMITING THAT IS NOT CONTROLLED WITH YOUR NAUSEA MEDICATION *UNUSUAL SHORTNESS OF BREATH *UNUSUAL BRUISING OR BLEEDING *URINARY PROBLEMS (pain or burning when urinating, or frequent urination) *BOWEL PROBLEMS (unusual diarrhea, constipation, pain near the anus) TENDERNESS IN MOUTH AND THROAT WITH OR WITHOUT PRESENCE OF ULCERS (sore throat, sores in mouth, or a toothache) UNUSUAL RASH, SWELLING OR PAIN  UNUSUAL VAGINAL DISCHARGE OR ITCHING   Items with * indicate a potential emergency and should be followed up as soon as possible or go to the Emergency Department if any problems should occur.  Please show the CHEMOTHERAPY ALERT CARD or IMMUNOTHERAPY ALERT CARD at check-in to the  Emergency Department and triage nurse.  Should you have questions after your visit or need to cancel or reschedule your appointment, please contact [facility] [phone]  and follow the prompts.  Office hours are 8:00 a.m. to 4:30 p.m. Monday - Friday. Please note that voicemails left after 4:00 p.m. may not be returned until the following business day.  We are closed weekends and major holidays. You have access to a nurse at all times for urgent questions. Please call the main number to the clinic [phone] and follow the prompts.  For any non-urgent questions, you may also contact your provider using MyChart. We now offer e-Visits for anyone 18 and older to request care online for non-urgent symptoms. For details visit [email or web address].   Also download the MyChart app! Go to the app store, search "MyChart", open the app, select [facility], and log in with your MyChart username and password.  Masks are optional in the cancer centers. If you would like for your care team to wear a mask while they are taking care of you, please let them know. You may have one support person who is at least 82 years old accompany you for your appointments.  

## 2021-12-15 NOTE — Patient Instructions (Signed)
[facility] **   You were seen today by Tarri Abernethy PA-C for your anemia.    ANEMIA: As we discussed, your anemia can be caused by several different factors, including your chronic kidney disease and iron deficiency.  The labs that we checked showed low copper and possibly low vitamin B12 levels, which may be a large part of why you are anemic.  We will start you on B12 and copper supplements, and check your labs again in 1 month to make sure that your blood levels are improving.  If your blood levels are not improving despite copper and B12 supplements, we may need to check additional tests such as a bone marrow biopsy. START taking copper 5 mg daily. START taking vitamin B12 1000 mcg daily  LOW PLATELETS: Your low platelets are also most likely related to your low copper and vitamin B12.  LABS: Return in 4 weeks for repeat labs  FOLLOW-UP APPOINTMENT: Office visit in 5 weeks  ** Thank you for trusting me with your healthcare!  I strive to provide all of my patients with quality care at each visit.  If you receive a survey for this visit, I would be so grateful to you for taking the time to provide feedback.  Thank you in advance!  ~ Saran Laviolette                   Dr. Derek Jack   &   Tarri Abernethy, PA-C   - - - - - - - - - - - - - - - - - -    Thank you for choosing [facility] at [facility] to provide your oncology and hematology care.  To afford each patient quality time with our provider, please arrive at least 15 minutes before your scheduled appointment time.   If you have a lab appointment with the [facility] please come in thru the Main Entrance and check in at the main information desk.  You need to re-schedule your appointment should you arrive 10 or more minutes late.  We strive to give you quality time with our providers, and arriving late affects you and  other patients whose appointments are after yours.  Also, if you no show three or more times for appointments you may be dismissed from the clinic at the providers discretion.     Again, thank you for choosing [facility].  Our hope is that these requests will decrease the amount of time that you wait before being seen by our physicians.       _____________________________________________________________  Should you have questions after your visit to [facility], please contact our office at [phone] and follow the prompts.  Our office hours are 8:00 a.m. and 4:30 p.m. Monday - Friday.  Please note that voicemails left after 4:00 p.m. may not be returned until the following business day.  We are closed weekends and major holidays.  You do have access to a nurse 24-7, just call the main number to the clinic [phone] and do not press any options, hold on the line and a nurse will answer the phone.    For prescription refill requests, have your pharmacy contact our office and allow 72 hours.

## 2021-12-16 DIAGNOSIS — N186 End stage renal disease: Secondary | ICD-10-CM | POA: Diagnosis not present

## 2021-12-16 DIAGNOSIS — E1122 Type 2 diabetes mellitus with diabetic chronic kidney disease: Secondary | ICD-10-CM | POA: Diagnosis not present

## 2021-12-16 DIAGNOSIS — N2581 Secondary hyperparathyroidism of renal origin: Secondary | ICD-10-CM | POA: Diagnosis not present

## 2021-12-16 DIAGNOSIS — Z992 Dependence on renal dialysis: Secondary | ICD-10-CM | POA: Diagnosis not present

## 2021-12-16 DIAGNOSIS — Z23 Encounter for immunization: Secondary | ICD-10-CM | POA: Diagnosis not present

## 2021-12-16 DIAGNOSIS — D631 Anemia in chronic kidney disease: Secondary | ICD-10-CM | POA: Diagnosis not present

## 2021-12-17 DIAGNOSIS — Z992 Dependence on renal dialysis: Secondary | ICD-10-CM | POA: Diagnosis not present

## 2021-12-17 DIAGNOSIS — N186 End stage renal disease: Secondary | ICD-10-CM | POA: Diagnosis not present

## 2021-12-17 DIAGNOSIS — I129 Hypertensive chronic kidney disease with stage 1 through stage 4 chronic kidney disease, or unspecified chronic kidney disease: Secondary | ICD-10-CM | POA: Diagnosis not present

## 2021-12-18 DIAGNOSIS — D631 Anemia in chronic kidney disease: Secondary | ICD-10-CM | POA: Diagnosis not present

## 2021-12-18 DIAGNOSIS — Z992 Dependence on renal dialysis: Secondary | ICD-10-CM | POA: Diagnosis not present

## 2021-12-18 DIAGNOSIS — E1122 Type 2 diabetes mellitus with diabetic chronic kidney disease: Secondary | ICD-10-CM | POA: Diagnosis not present

## 2021-12-18 DIAGNOSIS — N186 End stage renal disease: Secondary | ICD-10-CM | POA: Diagnosis not present

## 2021-12-18 DIAGNOSIS — N2581 Secondary hyperparathyroidism of renal origin: Secondary | ICD-10-CM | POA: Diagnosis not present

## 2021-12-20 DIAGNOSIS — D631 Anemia in chronic kidney disease: Secondary | ICD-10-CM | POA: Diagnosis not present

## 2021-12-20 DIAGNOSIS — Z992 Dependence on renal dialysis: Secondary | ICD-10-CM | POA: Diagnosis not present

## 2021-12-20 DIAGNOSIS — E1122 Type 2 diabetes mellitus with diabetic chronic kidney disease: Secondary | ICD-10-CM | POA: Diagnosis not present

## 2021-12-20 DIAGNOSIS — N186 End stage renal disease: Secondary | ICD-10-CM | POA: Diagnosis not present

## 2021-12-20 DIAGNOSIS — N2581 Secondary hyperparathyroidism of renal origin: Secondary | ICD-10-CM | POA: Diagnosis not present

## 2021-12-22 ENCOUNTER — Ambulatory Visit: Payer: Medicare Other | Admitting: Cardiovascular Disease

## 2021-12-23 DIAGNOSIS — N2581 Secondary hyperparathyroidism of renal origin: Secondary | ICD-10-CM | POA: Diagnosis not present

## 2021-12-23 DIAGNOSIS — Z992 Dependence on renal dialysis: Secondary | ICD-10-CM | POA: Diagnosis not present

## 2021-12-23 DIAGNOSIS — D631 Anemia in chronic kidney disease: Secondary | ICD-10-CM | POA: Diagnosis not present

## 2021-12-23 DIAGNOSIS — N186 End stage renal disease: Secondary | ICD-10-CM | POA: Diagnosis not present

## 2021-12-23 DIAGNOSIS — E1122 Type 2 diabetes mellitus with diabetic chronic kidney disease: Secondary | ICD-10-CM | POA: Diagnosis not present

## 2021-12-25 DIAGNOSIS — N186 End stage renal disease: Secondary | ICD-10-CM | POA: Diagnosis not present

## 2021-12-25 DIAGNOSIS — D631 Anemia in chronic kidney disease: Secondary | ICD-10-CM | POA: Diagnosis not present

## 2021-12-25 DIAGNOSIS — Z992 Dependence on renal dialysis: Secondary | ICD-10-CM | POA: Diagnosis not present

## 2021-12-25 DIAGNOSIS — E1122 Type 2 diabetes mellitus with diabetic chronic kidney disease: Secondary | ICD-10-CM | POA: Diagnosis not present

## 2021-12-25 DIAGNOSIS — N2581 Secondary hyperparathyroidism of renal origin: Secondary | ICD-10-CM | POA: Diagnosis not present

## 2021-12-27 DIAGNOSIS — E1122 Type 2 diabetes mellitus with diabetic chronic kidney disease: Secondary | ICD-10-CM | POA: Diagnosis not present

## 2021-12-27 DIAGNOSIS — D631 Anemia in chronic kidney disease: Secondary | ICD-10-CM | POA: Diagnosis not present

## 2021-12-27 DIAGNOSIS — N186 End stage renal disease: Secondary | ICD-10-CM | POA: Diagnosis not present

## 2021-12-27 DIAGNOSIS — N2581 Secondary hyperparathyroidism of renal origin: Secondary | ICD-10-CM | POA: Diagnosis not present

## 2021-12-27 DIAGNOSIS — Z992 Dependence on renal dialysis: Secondary | ICD-10-CM | POA: Diagnosis not present

## 2021-12-30 DIAGNOSIS — N2581 Secondary hyperparathyroidism of renal origin: Secondary | ICD-10-CM | POA: Diagnosis not present

## 2021-12-30 DIAGNOSIS — Z992 Dependence on renal dialysis: Secondary | ICD-10-CM | POA: Diagnosis not present

## 2021-12-30 DIAGNOSIS — E1122 Type 2 diabetes mellitus with diabetic chronic kidney disease: Secondary | ICD-10-CM | POA: Diagnosis not present

## 2021-12-30 DIAGNOSIS — N186 End stage renal disease: Secondary | ICD-10-CM | POA: Diagnosis not present

## 2021-12-30 DIAGNOSIS — D631 Anemia in chronic kidney disease: Secondary | ICD-10-CM | POA: Diagnosis not present

## 2022-01-01 DIAGNOSIS — D631 Anemia in chronic kidney disease: Secondary | ICD-10-CM | POA: Diagnosis not present

## 2022-01-01 DIAGNOSIS — E1122 Type 2 diabetes mellitus with diabetic chronic kidney disease: Secondary | ICD-10-CM | POA: Diagnosis not present

## 2022-01-01 DIAGNOSIS — N186 End stage renal disease: Secondary | ICD-10-CM | POA: Diagnosis not present

## 2022-01-01 DIAGNOSIS — E039 Hypothyroidism, unspecified: Secondary | ICD-10-CM | POA: Diagnosis not present

## 2022-01-01 DIAGNOSIS — Z992 Dependence on renal dialysis: Secondary | ICD-10-CM | POA: Diagnosis not present

## 2022-01-01 DIAGNOSIS — N2581 Secondary hyperparathyroidism of renal origin: Secondary | ICD-10-CM | POA: Diagnosis not present

## 2022-01-03 DIAGNOSIS — N186 End stage renal disease: Secondary | ICD-10-CM | POA: Diagnosis not present

## 2022-01-03 DIAGNOSIS — E1122 Type 2 diabetes mellitus with diabetic chronic kidney disease: Secondary | ICD-10-CM | POA: Diagnosis not present

## 2022-01-03 DIAGNOSIS — D631 Anemia in chronic kidney disease: Secondary | ICD-10-CM | POA: Diagnosis not present

## 2022-01-03 DIAGNOSIS — Z992 Dependence on renal dialysis: Secondary | ICD-10-CM | POA: Diagnosis not present

## 2022-01-03 DIAGNOSIS — N2581 Secondary hyperparathyroidism of renal origin: Secondary | ICD-10-CM | POA: Diagnosis not present

## 2022-01-05 DIAGNOSIS — N186 End stage renal disease: Secondary | ICD-10-CM | POA: Diagnosis not present

## 2022-01-05 DIAGNOSIS — Z992 Dependence on renal dialysis: Secondary | ICD-10-CM | POA: Diagnosis not present

## 2022-01-05 DIAGNOSIS — D631 Anemia in chronic kidney disease: Secondary | ICD-10-CM | POA: Diagnosis not present

## 2022-01-05 DIAGNOSIS — E1122 Type 2 diabetes mellitus with diabetic chronic kidney disease: Secondary | ICD-10-CM | POA: Diagnosis not present

## 2022-01-05 DIAGNOSIS — N2581 Secondary hyperparathyroidism of renal origin: Secondary | ICD-10-CM | POA: Diagnosis not present

## 2022-01-07 DIAGNOSIS — E1122 Type 2 diabetes mellitus with diabetic chronic kidney disease: Secondary | ICD-10-CM | POA: Diagnosis not present

## 2022-01-07 DIAGNOSIS — D631 Anemia in chronic kidney disease: Secondary | ICD-10-CM | POA: Diagnosis not present

## 2022-01-07 DIAGNOSIS — Z992 Dependence on renal dialysis: Secondary | ICD-10-CM | POA: Diagnosis not present

## 2022-01-07 DIAGNOSIS — N2581 Secondary hyperparathyroidism of renal origin: Secondary | ICD-10-CM | POA: Diagnosis not present

## 2022-01-07 DIAGNOSIS — N186 End stage renal disease: Secondary | ICD-10-CM | POA: Diagnosis not present

## 2022-01-10 DIAGNOSIS — D631 Anemia in chronic kidney disease: Secondary | ICD-10-CM | POA: Diagnosis not present

## 2022-01-10 DIAGNOSIS — N2581 Secondary hyperparathyroidism of renal origin: Secondary | ICD-10-CM | POA: Diagnosis not present

## 2022-01-10 DIAGNOSIS — Z992 Dependence on renal dialysis: Secondary | ICD-10-CM | POA: Diagnosis not present

## 2022-01-10 DIAGNOSIS — N186 End stage renal disease: Secondary | ICD-10-CM | POA: Diagnosis not present

## 2022-01-10 DIAGNOSIS — E1122 Type 2 diabetes mellitus with diabetic chronic kidney disease: Secondary | ICD-10-CM | POA: Diagnosis not present

## 2022-01-12 ENCOUNTER — Inpatient Hospital Stay: Payer: Medicare Other | Attending: Physician Assistant

## 2022-01-12 DIAGNOSIS — I129 Hypertensive chronic kidney disease with stage 1 through stage 4 chronic kidney disease, or unspecified chronic kidney disease: Secondary | ICD-10-CM | POA: Insufficient documentation

## 2022-01-12 DIAGNOSIS — D539 Nutritional anemia, unspecified: Secondary | ICD-10-CM | POA: Diagnosis not present

## 2022-01-12 DIAGNOSIS — E538 Deficiency of other specified B group vitamins: Secondary | ICD-10-CM

## 2022-01-12 DIAGNOSIS — N186 End stage renal disease: Secondary | ICD-10-CM | POA: Insufficient documentation

## 2022-01-12 DIAGNOSIS — D696 Thrombocytopenia, unspecified: Secondary | ICD-10-CM | POA: Insufficient documentation

## 2022-01-12 DIAGNOSIS — E610 Copper deficiency: Secondary | ICD-10-CM

## 2022-01-12 DIAGNOSIS — E1122 Type 2 diabetes mellitus with diabetic chronic kidney disease: Secondary | ICD-10-CM | POA: Insufficient documentation

## 2022-01-12 LAB — CBC WITH DIFFERENTIAL/PLATELET
Abs Immature Granulocytes: 0.1 10*3/uL — ABNORMAL HIGH (ref 0.00–0.07)
Basophils Absolute: 0 10*3/uL (ref 0.0–0.1)
Basophils Relative: 1 %
Eosinophils Absolute: 0.3 10*3/uL (ref 0.0–0.5)
Eosinophils Relative: 5 %
HCT: 31.1 % — ABNORMAL LOW (ref 39.0–52.0)
Hemoglobin: 9.1 g/dL — ABNORMAL LOW (ref 13.0–17.0)
Immature Granulocytes: 2 %
Lymphocytes Relative: 15 %
Lymphs Abs: 1 10*3/uL (ref 0.7–4.0)
MCH: 28.1 pg (ref 26.0–34.0)
MCHC: 29.3 g/dL — ABNORMAL LOW (ref 30.0–36.0)
MCV: 96 fL (ref 80.0–100.0)
Monocytes Absolute: 0.5 10*3/uL (ref 0.1–1.0)
Monocytes Relative: 8 %
Neutro Abs: 4.5 10*3/uL (ref 1.7–7.7)
Neutrophils Relative %: 69 %
Platelets: 130 10*3/uL — ABNORMAL LOW (ref 150–400)
RBC: 3.24 MIL/uL — ABNORMAL LOW (ref 4.22–5.81)
RDW: 16.3 % — ABNORMAL HIGH (ref 11.5–15.5)
WBC: 6.4 10*3/uL (ref 4.0–10.5)
nRBC: 0 % (ref 0.0–0.2)

## 2022-01-12 LAB — VITAMIN B12: Vitamin B-12: 1099 pg/mL — ABNORMAL HIGH (ref 180–914)

## 2022-01-13 DIAGNOSIS — N186 End stage renal disease: Secondary | ICD-10-CM | POA: Diagnosis not present

## 2022-01-13 DIAGNOSIS — E1122 Type 2 diabetes mellitus with diabetic chronic kidney disease: Secondary | ICD-10-CM | POA: Diagnosis not present

## 2022-01-13 DIAGNOSIS — Z992 Dependence on renal dialysis: Secondary | ICD-10-CM | POA: Diagnosis not present

## 2022-01-13 DIAGNOSIS — N2581 Secondary hyperparathyroidism of renal origin: Secondary | ICD-10-CM | POA: Diagnosis not present

## 2022-01-13 DIAGNOSIS — D631 Anemia in chronic kidney disease: Secondary | ICD-10-CM | POA: Diagnosis not present

## 2022-01-13 LAB — COPPER, SERUM: Copper: 78 ug/dL (ref 69–132)

## 2022-01-14 LAB — METHYLMALONIC ACID, SERUM: Methylmalonic Acid, Quantitative: 926 nmol/L — ABNORMAL HIGH (ref 0–378)

## 2022-01-15 DIAGNOSIS — E1122 Type 2 diabetes mellitus with diabetic chronic kidney disease: Secondary | ICD-10-CM | POA: Diagnosis not present

## 2022-01-15 DIAGNOSIS — Z992 Dependence on renal dialysis: Secondary | ICD-10-CM | POA: Diagnosis not present

## 2022-01-15 DIAGNOSIS — N186 End stage renal disease: Secondary | ICD-10-CM | POA: Diagnosis not present

## 2022-01-15 DIAGNOSIS — D631 Anemia in chronic kidney disease: Secondary | ICD-10-CM | POA: Diagnosis not present

## 2022-01-15 DIAGNOSIS — N2581 Secondary hyperparathyroidism of renal origin: Secondary | ICD-10-CM | POA: Diagnosis not present

## 2022-01-16 DIAGNOSIS — Z992 Dependence on renal dialysis: Secondary | ICD-10-CM | POA: Diagnosis not present

## 2022-01-16 DIAGNOSIS — N186 End stage renal disease: Secondary | ICD-10-CM | POA: Diagnosis not present

## 2022-01-16 DIAGNOSIS — I129 Hypertensive chronic kidney disease with stage 1 through stage 4 chronic kidney disease, or unspecified chronic kidney disease: Secondary | ICD-10-CM | POA: Diagnosis not present

## 2022-01-17 DIAGNOSIS — E1122 Type 2 diabetes mellitus with diabetic chronic kidney disease: Secondary | ICD-10-CM | POA: Diagnosis not present

## 2022-01-17 DIAGNOSIS — N186 End stage renal disease: Secondary | ICD-10-CM | POA: Diagnosis not present

## 2022-01-17 DIAGNOSIS — D631 Anemia in chronic kidney disease: Secondary | ICD-10-CM | POA: Diagnosis not present

## 2022-01-17 DIAGNOSIS — N2581 Secondary hyperparathyroidism of renal origin: Secondary | ICD-10-CM | POA: Diagnosis not present

## 2022-01-17 DIAGNOSIS — Z992 Dependence on renal dialysis: Secondary | ICD-10-CM | POA: Diagnosis not present

## 2022-01-17 NOTE — Progress Notes (Unsigned)
[place], [place] [address]   CLINIC:  Medical Oncology/Hematology  PCP:  [name], [place] [address] [phone]   REASON FOR VISIT:  Follow-up for macrocytic anemia  CURRENT THERAPY: IV iron and Epogen via nephrology  INTERVAL HISTORY:  Bradley Soto 81 y.o. male returns for routine follow-up of his macrocytic anemia.  He was seen for initial consultation by Dr. Delton Coombes on 11/06/2021.  At today's visit, he reports feeling fair, but with significant fatigue.  No recent hospitalizations, surgeries, or changes in baseline health status.  He admits to easy bruising, denies any petechial rash or abnormal bleeding such as rectal bleeding or melena.  He denies any fever, chills, night sweats, or unintentional weight loss.  He reports significant fatigue ever since starting hemodialysis, but reports that this has been significantly worsened over the past several months.  He has chronic dyspnea on exertion.  No pica, headaches, chest pain, lightheadedness, or syncope.  He has little to no energy and 100% appetite. He endorses that he is maintaining a stable weight.    REVIEW OF SYSTEMS:  Review of Systems  Constitutional:  Positive for fatigue. Negative for appetite change, chills, diaphoresis, fever and unexpected weight change.  HENT:   Negative for lump/mass and nosebleeds.   Eyes:  Negative for eye problems.  Respiratory:  Negative for cough, hemoptysis and shortness of breath.   Cardiovascular:  Negative for chest pain, leg swelling and palpitations.  Gastrointestinal:  Negative for abdominal pain, blood in stool, constipation, diarrhea, nausea and vomiting.  Genitourinary:  Negative for hematuria.   Skin: Negative.   Neurological:  Positive for numbness (Feet and hands). Negative for dizziness, headaches and light-headedness.  Hematological:  Bruises/bleeds easily (Bruising).  Psychiatric/Behavioral:   Positive for sleep disturbance.       PAST MEDICAL/SURGICAL HISTORY:  Past Medical History:  Diagnosis Date   Anxiety    Atrial fibrillation (HCC)    Bell's palsy    left side of face droops   CAD (coronary artery disease)    s/p stent placement   CKD (chronic kidney disease), stage III ([place])    [place]- TTHSat- [facility]   Colon polyps    Diabetes mellitus without complication ([place])    Gout    History of blood transfusion    History of kidney stones    Passes   HLD (hyperlipidemia)    HTN (hypertension)    Insomnia    MI (myocardial infarction) ([place]) 1998   Neuropathy    NSTEMI (non-ST elevated myocardial infarction) ([place])    s/p PCI in early 2000s   OSA on CPAP    wears CPAP   Presence of permanent cardiac pacemaker    SSS (sick sinus syndrome) (HCC)    Type II or unspecified type diabetes mellitus without mention of complication, not stated as uncontrolled    Wears glasses    reading    Wears partial dentures    Past Surgical History:  Procedure Laterality Date   APPENDECTOMY     AV FISTULA PLACEMENT Right 07/28/2019   Procedure: RIGHT ARM Brachiocephalic ARTERIOVENOUS (AV) FISTULA CREATION;  Surgeon: Serafina Mitchell, MD;  Location: [facility];  Service: Vascular;  Laterality: Right;   CATARACT EXTRACTION     CLIPPING OF ATRIAL APPENDAGE  12/05/2018   Procedure: Clipping Of Atrial Appendage;  Surgeon: Wonda Olds, MD;  Location: [facility];  Service: Open Heart Surgery;;   CORONARY ANGIOPLASTY WITH STENT  PLACEMENT     x4   CORONARY ARTERY BYPASS GRAFT N/A 12/05/2018   Procedure: CORONARY ARTERY BYPASS GRAFTING (CABG) X FOUR USING LEFT INTERNAL MAMMARY ARTERY AND RIGHT SAPHENOUS VEIN GRAFTS;  Surgeon: Wonda Olds, MD;  Location: [facility];  Service: Open Heart Surgery;  Laterality: N/A;   HERNIA REPAIR     ventral hernia repair with mesh--revision also was required   INSERT / REPLACE / REMOVE PACEMAKER  05/10/2000   pacemaker implanted in [place]]   IR  THORACENTESIS ASP PLEURAL SPACE W/IMG GUIDE  12/21/2018   IR THORACENTESIS ASP PLEURAL SPACE W/IMG GUIDE  12/30/2018   LEFT HEART CATH AND CORONARY ANGIOGRAPHY N/A 11/30/2018   Procedure: LEFT HEART CATH AND CORONARY ANGIOGRAPHY;  Surgeon: Burnell Blanks, MD;  Location: [facility];  Service: Cardiovascular;  Laterality: N/A;   MAZE N/A 12/05/2018   Procedure: MAZE;  Surgeon: Wonda Olds, MD;  Location: [facility];  Service: Open Heart Surgery;  Laterality: N/A;   PACEMAKER GENERATOR CHANGE  09/03/09   MDT Adapta generator change in [place]  2/2   polyp that could not be removed on colonoscopy   PPM GENERATOR CHANGEOUT N/A 06/01/2019   Procedure: PPM GENERATOR CHANGEOUT;  Surgeon: [name], MD;  Location: [facility];  Service: Cardiovascular;  Laterality: N/A;   REVISON OF ARTERIOVENOUS FISTULA Right 01/24/2020   Procedure: BANDING OF RIGHT ARM ARTERIOVENOUS FISTULA;  Surgeon: Serafina Mitchell, MD;  [facility];  Service: Vascular;  Laterality: Right;   TEE WITHOUT CARDIOVERSION N/A 12/05/2018   Procedure: TRANSESOPHAGEAL ECHOCARDIOGRAM (TEE);  Surgeon: Wonda Olds, MD;  Location: [facility];  Service: Open Heart Surgery;  Laterality: N/A;     SOCIAL HISTORY:  Social History   Socioeconomic History   Marital status: Married    Spouse name: Not on file   Number of children: Not on file   Years of education: Not on file   Highest education level: Not on file  Occupational History   Not on file  Tobacco Use   Smoking status: Former    Types: Cigarettes   Smokeless tobacco: Never   Tobacco comments:    " many years ago"  Vaping Use   Vaping Use: Never used  Substance and Sexual Activity   Alcohol use: No   Drug use: No   Sexual activity: Not on file  Other Topics Concern   Not on file  Social History Narrative   Lives with wife   Caffeine coffee 2 daily   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   Social Determinants  of Health   Financial Resource Strain: Not on file  Food Insecurity: Not on file  Transportation Needs: Not on file  Physical Activity: Not on file  Stress: Not on file  Social Connections: Not on file  Intimate Partner Violence: Not on file    FAMILY HISTORY:  Family History  Problem Relation Age of Onset   Heart attack Father 27   Heart disease Father    Breast cancer Sister    Pulmonary embolism Sister    Deep vein thrombosis Sister     CURRENT MEDICATIONS:  Outpatient Encounter Medications as of 01/19/2022  Medication Sig   allopurinol (ZYLOPRIM) 100 MG tablet Take 1 tablet (100 mg total) by mouth daily.   aspirin EC 81 MG tablet Take 81 mg by mouth daily.   B Complex-C-Zn-Folic Acid (DIALYVITE 161 WITH ZINC) 0.8 MG TABS Take  1 tablet by mouth at bedtime.   CALCITRIOL PO Take 4-5 tablets by mouth See admin instructions. Patient takes 4-5 calcitrol vitamin d tablets (1.78m per patient's spouse) during dialysis (T, TH, Sat).   Copper 5 MG TABS Take 1 tablet (5 mg total) by mouth daily.   cyanocobalamin (VITAMIN B12) 1000 MCG tablet Take 1 tablet (1,000 mcg total) by mouth daily.   gabapentin (NEURONTIN) 100 MG capsule Take 1 capsule (100 mg total) by mouth 3 (three) times daily.   iron sucrose in sodium chloride 0.9 % 100 mL once a week.   Methoxy PEG-Epoetin Beta (MIRCERA IJ) Every two weeks at dialysis   nitroGLYCERIN (NITROSTAT) 0.4 MG SL tablet Place 1 tablet (0.4 mg total) under the tongue every 5 (five) minutes as needed for chest pain.   NOVOLOG MIX 70/30 FLEXPEN (70-30) 100 UNIT/ML FlexPen Inject 30 Units into the skin daily. 30 unit Am and 30 units PM   rosuvastatin (CRESTOR) 5 MG tablet Take 5 mg by mouth every other day. Patient takes on Mondays, Wednesdays, Fridays and Sundays.   sevelamer carbonate (RENVELA) 800 MG tablet Take 1 tablet (800 mg total) by mouth 3 (three) times daily with meals.   No facility-administered encounter medications on file as of  01/19/2022.    ALLERGIES:  Allergies  Allergen Reactions   Other Other (See Comments)   Shellfish Allergy Rash     PHYSICAL EXAM:  ECOG PERFORMANCE STATUS: 2 - Symptomatic, <50% confined to bed  There were no vitals filed for this visit. There were no vitals filed for this visit. Physical Exam Constitutional:      Appearance: Normal appearance. He is obese.     Comments: Presents in wheelchair  HENT:     Head: Normocephalic and atraumatic.     Mouth/Throat:     Mouth: Mucous membranes are moist.  Eyes:     Extraocular Movements: Extraocular movements intact.     Pupils: Pupils are equal, round, and reactive to light.  Cardiovascular:     Rate and Rhythm: Normal rate and regular rhythm.     Pulses: Normal pulses.     Heart sounds: Normal heart sounds.  Pulmonary:     Effort: Pulmonary effort is normal.     Breath sounds: Normal breath sounds.     Comments: Decreased breath sounds Abdominal:     General: Bowel sounds are normal.     Palpations: Abdomen is soft.     Tenderness: There is no abdominal tenderness.  Musculoskeletal:        General: No swelling.     Right lower leg: No edema.     Left lower leg: No edema.  Lymphadenopathy:     Cervical: No cervical adenopathy.  Skin:    General: Skin is warm and dry.     Findings: Bruising present.  Neurological:     General: No focal deficit present.     Mental Status: He is alert and oriented to person, place, and time.  Psychiatric:        Mood and Affect: Mood normal.        Behavior: Behavior normal.      LABORATORY DATA:  I have reviewed the labs as listed.  CBC    Component Value Date/Time   WBC 6.4 01/12/2022 1110   RBC 3.24 (L) 01/12/2022 1110   HGB 9.1 (L) 01/12/2022 1110   HGB 12.5 (L) 05/30/2019 0923   HCT 31.1 (L) 01/12/2022 1110   HCT 38.8 05/30/2019 03354  PLT 130 (L) 01/12/2022 1110   PLT 136 (L) 05/30/2019 0923   MCV 96.0 01/12/2022 1110   MCV 95 05/30/2019 0923   MCH 28.1 01/12/2022  1110   MCHC 29.3 (L) 01/12/2022 1110   RDW 16.3 (H) 01/12/2022 1110   RDW 16.3 (H) 05/30/2019 0923   LYMPHSABS 1.0 01/12/2022 1110   LYMPHSABS 1.7 05/30/2019 0923   MONOABS 0.5 01/12/2022 1110   EOSABS 0.3 01/12/2022 1110   EOSABS 0.5 (H) 05/30/2019 0923   BASOSABS 0.0 01/12/2022 1110   BASOSABS 0.0 05/30/2019 0923      Latest Ref Rng & Units 10/24/2021    7:28 AM 01/24/2020    7:06 AM 11/03/2019    5:33 AM  CMP  Glucose 70 - 99 mg/dL 293  139  196   BUN 8 - 23 mg/dL 40  40  75   Creatinine 0.61 - 1.24 mg/dL 6.13  3.20  6.51   Sodium 135 - 145 mmol/L 139  140  139   Potassium 3.5 - 5.1 mmol/L 4.2  4.4  3.9   Chloride 98 - 111 mmol/L 97  102  108   CO2 22 - 32 mmol/L 28   22   Calcium 8.9 - 10.3 mg/dL 9.1   7.1   Total Protein 6.5 - 8.1 g/dL 6.9     Total Bilirubin 0.3 - 1.2 mg/dL 0.9     Alkaline Phos 38 - 126 U/L 87     AST 15 - 41 U/L 25     ALT 0 - 44 U/L 35       DIAGNOSTIC IMAGING:  I have independently reviewed the relevant imaging and discussed with the patient.  ASSESSMENT & PLAN: 1.  Normocytic to macrocytic anemia - Patient seen at the request of Dr. Marval Regal. - Patient is on hemodialysis for ESRD since 2021. - Patient receives Venofer 50 mg weekly via dialysis. - He receives Mircera 225 mcg weekly at dialysis. - Mild to moderate anemia since October 2020 - Last colonoscopy was 10 years ago in [place], [place] - PRBC transfusion on 10/24/2021 (Hgb 6.7).  PRBC transfusion 11/20/2021 via nephrology. - Hemoccult stool positive on 10/24/2021.  Patient has declined any further colonoscopy. - Hematology work-up (11/14/2021): Reticulocytes 2.0%.  Normal LDH and haptoglobin.  DAT negative. Normal B12 375, with significantly elevated MMA 1366 (difficult to interpret in setting of ESRD).  Normal folate. Significant copper deficiency (26) Negative H. pylori, hepatitis C.  No sign of active hepatitis B. Negative RF and ANA. Elevated ferritin 1248.  Iron saturation  10% with low serum iron 29.  TIBC 300. SPEP negative. - Most recent CBC (11/14/2021): Hgb 7.7/MCV 102.9 -Symptomatic with significant fatigue - Denies rectal bleeding or melena.   - Likely combination anemia from copper deficiency, possible B12 deficiency, ESRD with functional iron deficiency, and bleeding (Hemoccult positive stool).  Early MDS is also a possibility. - PLAN: Vitamin B12 1000 mcg injection given today.  Patient instructed to start taking vitamin B12 1000 mcg tablet daily. - Copper supplementation 5 mg daily. - Repeat labs (CBC/D, copper, B12, MMA) in 1 month. - If no improvement in CBC after 1 month, recommend bone marrow biopsy.  2.  Mild thrombocytopenia - Platelet count has been intermittently low since 2015  Hematology work-up (11/14/2021): Normal B12 375, with significantly elevated MMA 1366 (difficult to interpret in setting of ESRD).  Normal folate. Significant copper deficiency (26) Negative H. pylori, hepatitis C.  No sign of active hepatitis  B. Negative RF and ANA. SPEP negative. - Splenic ultrasound (12/03/2021): Spleen upper limits of normal at 13 cm with volume 554.6 cc -Reports easy bruising.  No petechial rash or B symptoms. - Most recent CBC (11/14/2021): Platelets 116, normal differential - PLAN: Vitamin B12 1000 mcg injection given today.  Patient instructed to start taking vitamin B12 1000 mcg tablet daily. - Copper supplementation 5 mg daily. - Repeat labs (CBC/D, copper, B12, MMA) in 1 month. - If no improvement in CBC after 1 month, recommend bone marrow biopsy.    3.  Social/history: - Lives with wife at home.  He is independent of ADLs and IADLs. 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  Quit smoking at age 89. - Sister had breast cancer.   PLAN SUMMARY & DISPOSITION: Labs in 4 weeks (CBC/D, copper, B12, MMA) Office visit 1 week after labs  All questions were answered. The patient knows to call the  clinic with any problems, questions or concerns.  Medical decision making: Moderate  Time spent on visit: I spent 20 minutes counseling the patient face to face. The total time spent in the appointment was 30 minutes and more than 50% was on counseling.   Harriett Rush, PA-C  12/15/2021 3:36 PM

## 2022-01-19 ENCOUNTER — Other Ambulatory Visit: Payer: Self-pay

## 2022-01-19 ENCOUNTER — Inpatient Hospital Stay: Payer: Medicare Other | Attending: Physician Assistant | Admitting: Physician Assistant

## 2022-01-19 VITALS — BP 158/74 | HR 87 | Temp 97.6°F | Resp 19 | Ht 71.0 in | Wt 218.6 lb

## 2022-01-19 DIAGNOSIS — E538 Deficiency of other specified B group vitamins: Secondary | ICD-10-CM | POA: Diagnosis not present

## 2022-01-19 DIAGNOSIS — E610 Copper deficiency: Secondary | ICD-10-CM

## 2022-01-19 DIAGNOSIS — D539 Nutritional anemia, unspecified: Secondary | ICD-10-CM | POA: Insufficient documentation

## 2022-01-19 DIAGNOSIS — N186 End stage renal disease: Secondary | ICD-10-CM | POA: Diagnosis not present

## 2022-01-19 DIAGNOSIS — D696 Thrombocytopenia, unspecified: Secondary | ICD-10-CM | POA: Diagnosis not present

## 2022-01-19 DIAGNOSIS — D631 Anemia in chronic kidney disease: Secondary | ICD-10-CM | POA: Diagnosis not present

## 2022-01-19 DIAGNOSIS — Z803 Family history of malignant neoplasm of breast: Secondary | ICD-10-CM | POA: Insufficient documentation

## 2022-01-19 DIAGNOSIS — D509 Iron deficiency anemia, unspecified: Secondary | ICD-10-CM

## 2022-01-19 DIAGNOSIS — Z87891 Personal history of nicotine dependence: Secondary | ICD-10-CM | POA: Diagnosis not present

## 2022-01-19 DIAGNOSIS — E1122 Type 2 diabetes mellitus with diabetic chronic kidney disease: Secondary | ICD-10-CM | POA: Insufficient documentation

## 2022-01-19 DIAGNOSIS — I120 Hypertensive chronic kidney disease with stage 5 chronic kidney disease or end stage renal disease: Secondary | ICD-10-CM | POA: Insufficient documentation

## 2022-01-19 NOTE — Patient Instructions (Addendum)
Bradley Soto at [facility] **   You were seen today by Tarri Abernethy PA-C for your anemia.    ANEMIA & LOW PLATELETS: Your blood levels are improving after you started B12 and copper.  CONTINUE taking copper 4 mg (two 2 mg tablets) daily & vitamin B12 1000 mcg daily  LABS: Return in 2 months for repeat labs  FOLLOW-UP APPOINTMENT: Office visit in 2 months, 1 week after labs  ** Thank you for trusting me with your healthcare!  I strive to provide all of my patients with quality care at each visit.  If you receive a survey for this visit, I would be so grateful to you for taking the time to provide feedback.  Thank you in advance!  ~ Monteen Toops                   Dr. Derek Jack   &   Tarri Abernethy, PA-C   - - - - - - - - - - - - - - - - - -    Thank you for choosing [facility] to provide your oncology and hematology care.  To afford each patient quality time with our provider, please arrive at least 15 minutes before your scheduled appointment time.   If you have a lab appointment with the [facility] please come in thru the Main Entrance and check in at the main information desk.  You need to re-schedule your appointment should you arrive 10 or more minutes late.  We strive to give you quality time with our providers, and arriving late affects you and other patients whose appointments are after yours.  Also, if you no show three or more times for appointments you may be dismissed from the clinic at the providers discretion.     Again, thank you for choosing [facility].  Our hope is that these requests will decrease the amount of time that you wait before being seen by our physicians.       _____________________________________________________________  Should you have questions after your visit to [facility], please contact our office at [phone] and follow the prompts.  Our office hours are 8:00 a.m. and 4:30 p.m. Monday - Friday.  Please note that voicemails left after 4:00 p.m. may not be returned until the following business day.  We are closed weekends and major holidays.  You do have access to a nurse 24-7, just call the main number to the clinic [phone] and do not press any options, hold on the line and a nurse will answer the phone.    For prescription refill requests, have your pharmacy contact our office and allow 72 hours.

## 2022-01-20 DIAGNOSIS — E1122 Type 2 diabetes mellitus with diabetic chronic kidney disease: Secondary | ICD-10-CM | POA: Diagnosis not present

## 2022-01-20 DIAGNOSIS — Z992 Dependence on renal dialysis: Secondary | ICD-10-CM | POA: Diagnosis not present

## 2022-01-20 DIAGNOSIS — D631 Anemia in chronic kidney disease: Secondary | ICD-10-CM | POA: Diagnosis not present

## 2022-01-20 DIAGNOSIS — N2581 Secondary hyperparathyroidism of renal origin: Secondary | ICD-10-CM | POA: Diagnosis not present

## 2022-01-20 DIAGNOSIS — N186 End stage renal disease: Secondary | ICD-10-CM | POA: Diagnosis not present

## 2022-01-22 DIAGNOSIS — D631 Anemia in chronic kidney disease: Secondary | ICD-10-CM | POA: Diagnosis not present

## 2022-01-22 DIAGNOSIS — E1122 Type 2 diabetes mellitus with diabetic chronic kidney disease: Secondary | ICD-10-CM | POA: Diagnosis not present

## 2022-01-22 DIAGNOSIS — N186 End stage renal disease: Secondary | ICD-10-CM | POA: Diagnosis not present

## 2022-01-22 DIAGNOSIS — Z992 Dependence on renal dialysis: Secondary | ICD-10-CM | POA: Diagnosis not present

## 2022-01-22 DIAGNOSIS — N2581 Secondary hyperparathyroidism of renal origin: Secondary | ICD-10-CM | POA: Diagnosis not present

## 2022-01-24 DIAGNOSIS — E1122 Type 2 diabetes mellitus with diabetic chronic kidney disease: Secondary | ICD-10-CM | POA: Diagnosis not present

## 2022-01-24 DIAGNOSIS — N186 End stage renal disease: Secondary | ICD-10-CM | POA: Diagnosis not present

## 2022-01-24 DIAGNOSIS — N2581 Secondary hyperparathyroidism of renal origin: Secondary | ICD-10-CM | POA: Diagnosis not present

## 2022-01-24 DIAGNOSIS — D631 Anemia in chronic kidney disease: Secondary | ICD-10-CM | POA: Diagnosis not present

## 2022-01-24 DIAGNOSIS — Z992 Dependence on renal dialysis: Secondary | ICD-10-CM | POA: Diagnosis not present

## 2022-01-27 DIAGNOSIS — E1122 Type 2 diabetes mellitus with diabetic chronic kidney disease: Secondary | ICD-10-CM | POA: Diagnosis not present

## 2022-01-27 DIAGNOSIS — D631 Anemia in chronic kidney disease: Secondary | ICD-10-CM | POA: Diagnosis not present

## 2022-01-27 DIAGNOSIS — N186 End stage renal disease: Secondary | ICD-10-CM | POA: Diagnosis not present

## 2022-01-27 DIAGNOSIS — N2581 Secondary hyperparathyroidism of renal origin: Secondary | ICD-10-CM | POA: Diagnosis not present

## 2022-01-27 DIAGNOSIS — Z992 Dependence on renal dialysis: Secondary | ICD-10-CM | POA: Diagnosis not present

## 2022-01-29 DIAGNOSIS — E1122 Type 2 diabetes mellitus with diabetic chronic kidney disease: Secondary | ICD-10-CM | POA: Diagnosis not present

## 2022-01-29 DIAGNOSIS — N186 End stage renal disease: Secondary | ICD-10-CM | POA: Diagnosis not present

## 2022-01-29 DIAGNOSIS — D631 Anemia in chronic kidney disease: Secondary | ICD-10-CM | POA: Diagnosis not present

## 2022-01-29 DIAGNOSIS — Z992 Dependence on renal dialysis: Secondary | ICD-10-CM | POA: Diagnosis not present

## 2022-01-29 DIAGNOSIS — N2581 Secondary hyperparathyroidism of renal origin: Secondary | ICD-10-CM | POA: Diagnosis not present

## 2022-01-31 DIAGNOSIS — D631 Anemia in chronic kidney disease: Secondary | ICD-10-CM | POA: Diagnosis not present

## 2022-01-31 DIAGNOSIS — N186 End stage renal disease: Secondary | ICD-10-CM | POA: Diagnosis not present

## 2022-01-31 DIAGNOSIS — Z992 Dependence on renal dialysis: Secondary | ICD-10-CM | POA: Diagnosis not present

## 2022-01-31 DIAGNOSIS — N2581 Secondary hyperparathyroidism of renal origin: Secondary | ICD-10-CM | POA: Diagnosis not present

## 2022-01-31 DIAGNOSIS — E1122 Type 2 diabetes mellitus with diabetic chronic kidney disease: Secondary | ICD-10-CM | POA: Diagnosis not present

## 2022-02-03 DIAGNOSIS — D631 Anemia in chronic kidney disease: Secondary | ICD-10-CM | POA: Diagnosis not present

## 2022-02-03 DIAGNOSIS — N186 End stage renal disease: Secondary | ICD-10-CM | POA: Diagnosis not present

## 2022-02-03 DIAGNOSIS — E1122 Type 2 diabetes mellitus with diabetic chronic kidney disease: Secondary | ICD-10-CM | POA: Diagnosis not present

## 2022-02-03 DIAGNOSIS — Z992 Dependence on renal dialysis: Secondary | ICD-10-CM | POA: Diagnosis not present

## 2022-02-03 DIAGNOSIS — N2581 Secondary hyperparathyroidism of renal origin: Secondary | ICD-10-CM | POA: Diagnosis not present

## 2022-02-05 DIAGNOSIS — E1122 Type 2 diabetes mellitus with diabetic chronic kidney disease: Secondary | ICD-10-CM | POA: Diagnosis not present

## 2022-02-05 DIAGNOSIS — N186 End stage renal disease: Secondary | ICD-10-CM | POA: Diagnosis not present

## 2022-02-05 DIAGNOSIS — N2581 Secondary hyperparathyroidism of renal origin: Secondary | ICD-10-CM | POA: Diagnosis not present

## 2022-02-05 DIAGNOSIS — D631 Anemia in chronic kidney disease: Secondary | ICD-10-CM | POA: Diagnosis not present

## 2022-02-05 DIAGNOSIS — Z992 Dependence on renal dialysis: Secondary | ICD-10-CM | POA: Diagnosis not present

## 2022-02-07 DIAGNOSIS — D631 Anemia in chronic kidney disease: Secondary | ICD-10-CM | POA: Diagnosis not present

## 2022-02-07 DIAGNOSIS — N186 End stage renal disease: Secondary | ICD-10-CM | POA: Diagnosis not present

## 2022-02-07 DIAGNOSIS — E1122 Type 2 diabetes mellitus with diabetic chronic kidney disease: Secondary | ICD-10-CM | POA: Diagnosis not present

## 2022-02-07 DIAGNOSIS — Z992 Dependence on renal dialysis: Secondary | ICD-10-CM | POA: Diagnosis not present

## 2022-02-07 DIAGNOSIS — N2581 Secondary hyperparathyroidism of renal origin: Secondary | ICD-10-CM | POA: Diagnosis not present

## 2022-02-10 DIAGNOSIS — D631 Anemia in chronic kidney disease: Secondary | ICD-10-CM | POA: Diagnosis not present

## 2022-02-10 DIAGNOSIS — N2581 Secondary hyperparathyroidism of renal origin: Secondary | ICD-10-CM | POA: Diagnosis not present

## 2022-02-10 DIAGNOSIS — Z992 Dependence on renal dialysis: Secondary | ICD-10-CM | POA: Diagnosis not present

## 2022-02-10 DIAGNOSIS — E1122 Type 2 diabetes mellitus with diabetic chronic kidney disease: Secondary | ICD-10-CM | POA: Diagnosis not present

## 2022-02-10 DIAGNOSIS — N186 End stage renal disease: Secondary | ICD-10-CM | POA: Diagnosis not present

## 2022-02-11 ENCOUNTER — Other Ambulatory Visit: Payer: Self-pay | Admitting: Physician Assistant

## 2022-02-11 DIAGNOSIS — E538 Deficiency of other specified B group vitamins: Secondary | ICD-10-CM

## 2022-02-12 DIAGNOSIS — Z992 Dependence on renal dialysis: Secondary | ICD-10-CM | POA: Diagnosis not present

## 2022-02-12 DIAGNOSIS — D631 Anemia in chronic kidney disease: Secondary | ICD-10-CM | POA: Diagnosis not present

## 2022-02-12 DIAGNOSIS — N186 End stage renal disease: Secondary | ICD-10-CM | POA: Diagnosis not present

## 2022-02-12 DIAGNOSIS — N2581 Secondary hyperparathyroidism of renal origin: Secondary | ICD-10-CM | POA: Diagnosis not present

## 2022-02-12 DIAGNOSIS — E1122 Type 2 diabetes mellitus with diabetic chronic kidney disease: Secondary | ICD-10-CM | POA: Diagnosis not present

## 2022-02-14 DIAGNOSIS — N2581 Secondary hyperparathyroidism of renal origin: Secondary | ICD-10-CM | POA: Diagnosis not present

## 2022-02-14 DIAGNOSIS — E1122 Type 2 diabetes mellitus with diabetic chronic kidney disease: Secondary | ICD-10-CM | POA: Diagnosis not present

## 2022-02-14 DIAGNOSIS — Z992 Dependence on renal dialysis: Secondary | ICD-10-CM | POA: Diagnosis not present

## 2022-02-14 DIAGNOSIS — D631 Anemia in chronic kidney disease: Secondary | ICD-10-CM | POA: Diagnosis not present

## 2022-02-14 DIAGNOSIS — N186 End stage renal disease: Secondary | ICD-10-CM | POA: Diagnosis not present

## 2022-02-16 DIAGNOSIS — N186 End stage renal disease: Secondary | ICD-10-CM | POA: Diagnosis not present

## 2022-02-16 DIAGNOSIS — Z992 Dependence on renal dialysis: Secondary | ICD-10-CM | POA: Diagnosis not present

## 2022-02-16 DIAGNOSIS — I129 Hypertensive chronic kidney disease with stage 1 through stage 4 chronic kidney disease, or unspecified chronic kidney disease: Secondary | ICD-10-CM | POA: Diagnosis not present

## 2022-02-17 DIAGNOSIS — N186 End stage renal disease: Secondary | ICD-10-CM | POA: Diagnosis not present

## 2022-02-17 DIAGNOSIS — Z992 Dependence on renal dialysis: Secondary | ICD-10-CM | POA: Diagnosis not present

## 2022-02-17 DIAGNOSIS — D631 Anemia in chronic kidney disease: Secondary | ICD-10-CM | POA: Diagnosis not present

## 2022-02-17 DIAGNOSIS — N2581 Secondary hyperparathyroidism of renal origin: Secondary | ICD-10-CM | POA: Diagnosis not present

## 2022-02-17 DIAGNOSIS — D509 Iron deficiency anemia, unspecified: Secondary | ICD-10-CM | POA: Diagnosis not present

## 2022-02-19 DIAGNOSIS — N186 End stage renal disease: Secondary | ICD-10-CM | POA: Diagnosis not present

## 2022-02-19 DIAGNOSIS — N2581 Secondary hyperparathyroidism of renal origin: Secondary | ICD-10-CM | POA: Diagnosis not present

## 2022-02-19 DIAGNOSIS — D509 Iron deficiency anemia, unspecified: Secondary | ICD-10-CM | POA: Diagnosis not present

## 2022-02-19 DIAGNOSIS — Z992 Dependence on renal dialysis: Secondary | ICD-10-CM | POA: Diagnosis not present

## 2022-02-19 DIAGNOSIS — D631 Anemia in chronic kidney disease: Secondary | ICD-10-CM | POA: Diagnosis not present

## 2022-02-21 DIAGNOSIS — Z992 Dependence on renal dialysis: Secondary | ICD-10-CM | POA: Diagnosis not present

## 2022-02-21 DIAGNOSIS — D509 Iron deficiency anemia, unspecified: Secondary | ICD-10-CM | POA: Diagnosis not present

## 2022-02-21 DIAGNOSIS — N186 End stage renal disease: Secondary | ICD-10-CM | POA: Diagnosis not present

## 2022-02-21 DIAGNOSIS — D631 Anemia in chronic kidney disease: Secondary | ICD-10-CM | POA: Diagnosis not present

## 2022-02-21 DIAGNOSIS — N2581 Secondary hyperparathyroidism of renal origin: Secondary | ICD-10-CM | POA: Diagnosis not present

## 2022-02-24 DIAGNOSIS — N186 End stage renal disease: Secondary | ICD-10-CM | POA: Diagnosis not present

## 2022-02-24 DIAGNOSIS — D509 Iron deficiency anemia, unspecified: Secondary | ICD-10-CM | POA: Diagnosis not present

## 2022-02-24 DIAGNOSIS — D631 Anemia in chronic kidney disease: Secondary | ICD-10-CM | POA: Diagnosis not present

## 2022-02-24 DIAGNOSIS — N2581 Secondary hyperparathyroidism of renal origin: Secondary | ICD-10-CM | POA: Diagnosis not present

## 2022-02-24 DIAGNOSIS — Z992 Dependence on renal dialysis: Secondary | ICD-10-CM | POA: Diagnosis not present

## 2022-02-26 DIAGNOSIS — N186 End stage renal disease: Secondary | ICD-10-CM | POA: Diagnosis not present

## 2022-02-26 DIAGNOSIS — D509 Iron deficiency anemia, unspecified: Secondary | ICD-10-CM | POA: Diagnosis not present

## 2022-02-26 DIAGNOSIS — N2581 Secondary hyperparathyroidism of renal origin: Secondary | ICD-10-CM | POA: Diagnosis not present

## 2022-02-26 DIAGNOSIS — D631 Anemia in chronic kidney disease: Secondary | ICD-10-CM | POA: Diagnosis not present

## 2022-02-26 DIAGNOSIS — Z992 Dependence on renal dialysis: Secondary | ICD-10-CM | POA: Diagnosis not present

## 2022-02-27 ENCOUNTER — Ambulatory Visit (INDEPENDENT_AMBULATORY_CARE_PROVIDER_SITE_OTHER): Payer: Medicare Other

## 2022-02-27 DIAGNOSIS — I10 Essential (primary) hypertension: Secondary | ICD-10-CM | POA: Diagnosis not present

## 2022-02-27 DIAGNOSIS — M109 Gout, unspecified: Secondary | ICD-10-CM | POA: Diagnosis not present

## 2022-02-27 DIAGNOSIS — I495 Sick sinus syndrome: Secondary | ICD-10-CM

## 2022-02-27 DIAGNOSIS — E1169 Type 2 diabetes mellitus with other specified complication: Secondary | ICD-10-CM | POA: Diagnosis not present

## 2022-02-27 DIAGNOSIS — I700 Atherosclerosis of aorta: Secondary | ICD-10-CM | POA: Diagnosis not present

## 2022-02-27 DIAGNOSIS — R7989 Other specified abnormal findings of blood chemistry: Secondary | ICD-10-CM | POA: Diagnosis not present

## 2022-02-27 DIAGNOSIS — E785 Hyperlipidemia, unspecified: Secondary | ICD-10-CM | POA: Diagnosis not present

## 2022-02-27 LAB — CUP PACEART REMOTE DEVICE CHECK
Battery Remaining Longevity: 124 mo
Battery Voltage: 3.02 V
Brady Statistic AP VP Percent: 0 %
Brady Statistic AP VS Percent: 4.09 %
Brady Statistic AS VP Percent: 0.05 %
Brady Statistic AS VS Percent: 95.87 %
Brady Statistic RA Percent Paced: 4.36 %
Brady Statistic RV Percent Paced: 0.05 %
Date Time Interrogation Session: 20240111182924
Implantable Lead Connection Status: 753985
Implantable Lead Connection Status: 753985
Implantable Lead Implant Date: 20020325
Implantable Lead Implant Date: 20020325
Implantable Lead Location: 753859
Implantable Lead Location: 753860
Implantable Lead Model: 4092
Implantable Lead Model: 4524
Implantable Pulse Generator Implant Date: 20210415
Lead Channel Impedance Value: 247 Ohm
Lead Channel Impedance Value: 304 Ohm
Lead Channel Impedance Value: 437 Ohm
Lead Channel Impedance Value: 646 Ohm
Lead Channel Pacing Threshold Amplitude: 0.75 V
Lead Channel Pacing Threshold Amplitude: 2.5 V
Lead Channel Pacing Threshold Pulse Width: 0.4 ms
Lead Channel Pacing Threshold Pulse Width: 0.4 ms
Lead Channel Sensing Intrinsic Amplitude: 0.625 mV
Lead Channel Sensing Intrinsic Amplitude: 0.625 mV
Lead Channel Sensing Intrinsic Amplitude: 29.625 mV
Lead Channel Sensing Intrinsic Amplitude: 29.625 mV
Lead Channel Setting Pacing Amplitude: 2.5 V
Lead Channel Setting Pacing Amplitude: 3 V
Lead Channel Setting Pacing Pulse Width: 0.4 ms
Lead Channel Setting Sensing Sensitivity: 1.2 mV
Zone Setting Status: 755011
Zone Setting Status: 755011

## 2022-02-28 DIAGNOSIS — D509 Iron deficiency anemia, unspecified: Secondary | ICD-10-CM | POA: Diagnosis not present

## 2022-02-28 DIAGNOSIS — N2581 Secondary hyperparathyroidism of renal origin: Secondary | ICD-10-CM | POA: Diagnosis not present

## 2022-02-28 DIAGNOSIS — Z992 Dependence on renal dialysis: Secondary | ICD-10-CM | POA: Diagnosis not present

## 2022-02-28 DIAGNOSIS — N186 End stage renal disease: Secondary | ICD-10-CM | POA: Diagnosis not present

## 2022-02-28 DIAGNOSIS — D631 Anemia in chronic kidney disease: Secondary | ICD-10-CM | POA: Diagnosis not present

## 2022-03-03 DIAGNOSIS — D509 Iron deficiency anemia, unspecified: Secondary | ICD-10-CM | POA: Diagnosis not present

## 2022-03-03 DIAGNOSIS — N186 End stage renal disease: Secondary | ICD-10-CM | POA: Diagnosis not present

## 2022-03-03 DIAGNOSIS — Z992 Dependence on renal dialysis: Secondary | ICD-10-CM | POA: Diagnosis not present

## 2022-03-03 DIAGNOSIS — D631 Anemia in chronic kidney disease: Secondary | ICD-10-CM | POA: Diagnosis not present

## 2022-03-03 DIAGNOSIS — N2581 Secondary hyperparathyroidism of renal origin: Secondary | ICD-10-CM | POA: Diagnosis not present

## 2022-03-05 DIAGNOSIS — Z992 Dependence on renal dialysis: Secondary | ICD-10-CM | POA: Diagnosis not present

## 2022-03-05 DIAGNOSIS — N2581 Secondary hyperparathyroidism of renal origin: Secondary | ICD-10-CM | POA: Diagnosis not present

## 2022-03-05 DIAGNOSIS — N186 End stage renal disease: Secondary | ICD-10-CM | POA: Diagnosis not present

## 2022-03-05 DIAGNOSIS — D631 Anemia in chronic kidney disease: Secondary | ICD-10-CM | POA: Diagnosis not present

## 2022-03-05 DIAGNOSIS — D509 Iron deficiency anemia, unspecified: Secondary | ICD-10-CM | POA: Diagnosis not present

## 2022-03-06 DIAGNOSIS — D649 Anemia, unspecified: Secondary | ICD-10-CM | POA: Diagnosis not present

## 2022-03-06 DIAGNOSIS — Z1331 Encounter for screening for depression: Secondary | ICD-10-CM | POA: Diagnosis not present

## 2022-03-06 DIAGNOSIS — I25810 Atherosclerosis of coronary artery bypass graft(s) without angina pectoris: Secondary | ICD-10-CM | POA: Diagnosis not present

## 2022-03-06 DIAGNOSIS — Z992 Dependence on renal dialysis: Secondary | ICD-10-CM | POA: Diagnosis not present

## 2022-03-06 DIAGNOSIS — N186 End stage renal disease: Secondary | ICD-10-CM | POA: Diagnosis not present

## 2022-03-06 DIAGNOSIS — Z0000 Encounter for general adult medical examination without abnormal findings: Secondary | ICD-10-CM | POA: Diagnosis not present

## 2022-03-06 DIAGNOSIS — Z1339 Encounter for screening examination for other mental health and behavioral disorders: Secondary | ICD-10-CM | POA: Diagnosis not present

## 2022-03-06 DIAGNOSIS — I132 Hypertensive heart and chronic kidney disease with heart failure and with stage 5 chronic kidney disease, or end stage renal disease: Secondary | ICD-10-CM | POA: Diagnosis not present

## 2022-03-06 DIAGNOSIS — Z950 Presence of cardiac pacemaker: Secondary | ICD-10-CM | POA: Diagnosis not present

## 2022-03-06 DIAGNOSIS — I5032 Chronic diastolic (congestive) heart failure: Secondary | ICD-10-CM | POA: Diagnosis not present

## 2022-03-06 DIAGNOSIS — I4891 Unspecified atrial fibrillation: Secondary | ICD-10-CM | POA: Diagnosis not present

## 2022-03-06 DIAGNOSIS — I700 Atherosclerosis of aorta: Secondary | ICD-10-CM | POA: Diagnosis not present

## 2022-03-06 DIAGNOSIS — E1169 Type 2 diabetes mellitus with other specified complication: Secondary | ICD-10-CM | POA: Diagnosis not present

## 2022-03-06 DIAGNOSIS — Z794 Long term (current) use of insulin: Secondary | ICD-10-CM | POA: Diagnosis not present

## 2022-03-06 DIAGNOSIS — I739 Peripheral vascular disease, unspecified: Secondary | ICD-10-CM | POA: Diagnosis not present

## 2022-03-06 DIAGNOSIS — R82998 Other abnormal findings in urine: Secondary | ICD-10-CM | POA: Diagnosis not present

## 2022-03-07 DIAGNOSIS — N2581 Secondary hyperparathyroidism of renal origin: Secondary | ICD-10-CM | POA: Diagnosis not present

## 2022-03-07 DIAGNOSIS — D509 Iron deficiency anemia, unspecified: Secondary | ICD-10-CM | POA: Diagnosis not present

## 2022-03-07 DIAGNOSIS — D631 Anemia in chronic kidney disease: Secondary | ICD-10-CM | POA: Diagnosis not present

## 2022-03-07 DIAGNOSIS — Z992 Dependence on renal dialysis: Secondary | ICD-10-CM | POA: Diagnosis not present

## 2022-03-07 DIAGNOSIS — N186 End stage renal disease: Secondary | ICD-10-CM | POA: Diagnosis not present

## 2022-03-10 DIAGNOSIS — Z992 Dependence on renal dialysis: Secondary | ICD-10-CM | POA: Diagnosis not present

## 2022-03-10 DIAGNOSIS — D509 Iron deficiency anemia, unspecified: Secondary | ICD-10-CM | POA: Diagnosis not present

## 2022-03-10 DIAGNOSIS — N186 End stage renal disease: Secondary | ICD-10-CM | POA: Diagnosis not present

## 2022-03-10 DIAGNOSIS — N2581 Secondary hyperparathyroidism of renal origin: Secondary | ICD-10-CM | POA: Diagnosis not present

## 2022-03-10 DIAGNOSIS — D631 Anemia in chronic kidney disease: Secondary | ICD-10-CM | POA: Diagnosis not present

## 2022-03-12 DIAGNOSIS — Z992 Dependence on renal dialysis: Secondary | ICD-10-CM | POA: Diagnosis not present

## 2022-03-12 DIAGNOSIS — N186 End stage renal disease: Secondary | ICD-10-CM | POA: Diagnosis not present

## 2022-03-12 DIAGNOSIS — D631 Anemia in chronic kidney disease: Secondary | ICD-10-CM | POA: Diagnosis not present

## 2022-03-12 DIAGNOSIS — N2581 Secondary hyperparathyroidism of renal origin: Secondary | ICD-10-CM | POA: Diagnosis not present

## 2022-03-12 DIAGNOSIS — D509 Iron deficiency anemia, unspecified: Secondary | ICD-10-CM | POA: Diagnosis not present

## 2022-03-14 DIAGNOSIS — N2581 Secondary hyperparathyroidism of renal origin: Secondary | ICD-10-CM | POA: Diagnosis not present

## 2022-03-14 DIAGNOSIS — D509 Iron deficiency anemia, unspecified: Secondary | ICD-10-CM | POA: Diagnosis not present

## 2022-03-14 DIAGNOSIS — N186 End stage renal disease: Secondary | ICD-10-CM | POA: Diagnosis not present

## 2022-03-14 DIAGNOSIS — Z992 Dependence on renal dialysis: Secondary | ICD-10-CM | POA: Diagnosis not present

## 2022-03-14 DIAGNOSIS — D631 Anemia in chronic kidney disease: Secondary | ICD-10-CM | POA: Diagnosis not present

## 2022-03-17 DIAGNOSIS — D509 Iron deficiency anemia, unspecified: Secondary | ICD-10-CM | POA: Diagnosis not present

## 2022-03-17 DIAGNOSIS — N2581 Secondary hyperparathyroidism of renal origin: Secondary | ICD-10-CM | POA: Diagnosis not present

## 2022-03-17 DIAGNOSIS — D631 Anemia in chronic kidney disease: Secondary | ICD-10-CM | POA: Diagnosis not present

## 2022-03-17 DIAGNOSIS — N186 End stage renal disease: Secondary | ICD-10-CM | POA: Diagnosis not present

## 2022-03-17 DIAGNOSIS — Z992 Dependence on renal dialysis: Secondary | ICD-10-CM | POA: Diagnosis not present

## 2022-03-17 NOTE — Progress Notes (Signed)
Remote pacemaker transmission.   

## 2022-03-19 DIAGNOSIS — I129 Hypertensive chronic kidney disease with stage 1 through stage 4 chronic kidney disease, or unspecified chronic kidney disease: Secondary | ICD-10-CM | POA: Diagnosis not present

## 2022-03-19 DIAGNOSIS — D631 Anemia in chronic kidney disease: Secondary | ICD-10-CM | POA: Diagnosis not present

## 2022-03-19 DIAGNOSIS — D509 Iron deficiency anemia, unspecified: Secondary | ICD-10-CM | POA: Diagnosis not present

## 2022-03-19 DIAGNOSIS — E1122 Type 2 diabetes mellitus with diabetic chronic kidney disease: Secondary | ICD-10-CM | POA: Diagnosis not present

## 2022-03-19 DIAGNOSIS — N2581 Secondary hyperparathyroidism of renal origin: Secondary | ICD-10-CM | POA: Diagnosis not present

## 2022-03-19 DIAGNOSIS — Z992 Dependence on renal dialysis: Secondary | ICD-10-CM | POA: Diagnosis not present

## 2022-03-19 DIAGNOSIS — N186 End stage renal disease: Secondary | ICD-10-CM | POA: Diagnosis not present

## 2022-03-21 DIAGNOSIS — N186 End stage renal disease: Secondary | ICD-10-CM | POA: Diagnosis not present

## 2022-03-21 DIAGNOSIS — D509 Iron deficiency anemia, unspecified: Secondary | ICD-10-CM | POA: Diagnosis not present

## 2022-03-21 DIAGNOSIS — E1122 Type 2 diabetes mellitus with diabetic chronic kidney disease: Secondary | ICD-10-CM | POA: Diagnosis not present

## 2022-03-21 DIAGNOSIS — Z992 Dependence on renal dialysis: Secondary | ICD-10-CM | POA: Diagnosis not present

## 2022-03-21 DIAGNOSIS — N2581 Secondary hyperparathyroidism of renal origin: Secondary | ICD-10-CM | POA: Diagnosis not present

## 2022-03-21 DIAGNOSIS — D631 Anemia in chronic kidney disease: Secondary | ICD-10-CM | POA: Diagnosis not present

## 2022-03-23 ENCOUNTER — Other Ambulatory Visit: Payer: Medicare Other

## 2022-03-23 ENCOUNTER — Inpatient Hospital Stay: Payer: Medicare Other | Attending: Physician Assistant

## 2022-03-23 DIAGNOSIS — E610 Copper deficiency: Secondary | ICD-10-CM | POA: Diagnosis not present

## 2022-03-23 DIAGNOSIS — Z992 Dependence on renal dialysis: Secondary | ICD-10-CM | POA: Insufficient documentation

## 2022-03-23 DIAGNOSIS — D509 Iron deficiency anemia, unspecified: Secondary | ICD-10-CM

## 2022-03-23 DIAGNOSIS — Z87891 Personal history of nicotine dependence: Secondary | ICD-10-CM | POA: Diagnosis not present

## 2022-03-23 DIAGNOSIS — R7989 Other specified abnormal findings of blood chemistry: Secondary | ICD-10-CM | POA: Diagnosis not present

## 2022-03-23 DIAGNOSIS — I120 Hypertensive chronic kidney disease with stage 5 chronic kidney disease or end stage renal disease: Secondary | ICD-10-CM | POA: Diagnosis not present

## 2022-03-23 DIAGNOSIS — N186 End stage renal disease: Secondary | ICD-10-CM | POA: Diagnosis not present

## 2022-03-23 DIAGNOSIS — D696 Thrombocytopenia, unspecified: Secondary | ICD-10-CM | POA: Insufficient documentation

## 2022-03-23 DIAGNOSIS — Z803 Family history of malignant neoplasm of breast: Secondary | ICD-10-CM | POA: Diagnosis not present

## 2022-03-23 DIAGNOSIS — D539 Nutritional anemia, unspecified: Secondary | ICD-10-CM | POA: Diagnosis not present

## 2022-03-23 DIAGNOSIS — E538 Deficiency of other specified B group vitamins: Secondary | ICD-10-CM

## 2022-03-23 DIAGNOSIS — E1122 Type 2 diabetes mellitus with diabetic chronic kidney disease: Secondary | ICD-10-CM | POA: Insufficient documentation

## 2022-03-23 LAB — CBC WITH DIFFERENTIAL/PLATELET
Abs Immature Granulocytes: 0.07 10*3/uL (ref 0.00–0.07)
Basophils Absolute: 0 10*3/uL (ref 0.0–0.1)
Basophils Relative: 1 %
Eosinophils Absolute: 0.3 10*3/uL (ref 0.0–0.5)
Eosinophils Relative: 4 %
HCT: 37 % — ABNORMAL LOW (ref 39.0–52.0)
Hemoglobin: 11.2 g/dL — ABNORMAL LOW (ref 13.0–17.0)
Immature Granulocytes: 1 %
Lymphocytes Relative: 15 %
Lymphs Abs: 0.9 10*3/uL (ref 0.7–4.0)
MCH: 28.6 pg (ref 26.0–34.0)
MCHC: 30.3 g/dL (ref 30.0–36.0)
MCV: 94.4 fL (ref 80.0–100.0)
Monocytes Absolute: 0.5 10*3/uL (ref 0.1–1.0)
Monocytes Relative: 8 %
Neutro Abs: 4.4 10*3/uL (ref 1.7–7.7)
Neutrophils Relative %: 71 %
Platelets: 115 10*3/uL — ABNORMAL LOW (ref 150–400)
RBC: 3.92 MIL/uL — ABNORMAL LOW (ref 4.22–5.81)
RDW: 18.4 % — ABNORMAL HIGH (ref 11.5–15.5)
WBC: 6.2 10*3/uL (ref 4.0–10.5)
nRBC: 0 % (ref 0.0–0.2)

## 2022-03-23 LAB — VITAMIN B12: Vitamin B-12: 969 pg/mL — ABNORMAL HIGH (ref 180–914)

## 2022-03-24 DIAGNOSIS — N186 End stage renal disease: Secondary | ICD-10-CM | POA: Diagnosis not present

## 2022-03-24 DIAGNOSIS — Z992 Dependence on renal dialysis: Secondary | ICD-10-CM | POA: Diagnosis not present

## 2022-03-24 DIAGNOSIS — N2581 Secondary hyperparathyroidism of renal origin: Secondary | ICD-10-CM | POA: Diagnosis not present

## 2022-03-24 DIAGNOSIS — D509 Iron deficiency anemia, unspecified: Secondary | ICD-10-CM | POA: Diagnosis not present

## 2022-03-24 DIAGNOSIS — D631 Anemia in chronic kidney disease: Secondary | ICD-10-CM | POA: Diagnosis not present

## 2022-03-24 DIAGNOSIS — E1122 Type 2 diabetes mellitus with diabetic chronic kidney disease: Secondary | ICD-10-CM | POA: Diagnosis not present

## 2022-03-26 DIAGNOSIS — N2581 Secondary hyperparathyroidism of renal origin: Secondary | ICD-10-CM | POA: Diagnosis not present

## 2022-03-26 DIAGNOSIS — E1122 Type 2 diabetes mellitus with diabetic chronic kidney disease: Secondary | ICD-10-CM | POA: Diagnosis not present

## 2022-03-26 DIAGNOSIS — D631 Anemia in chronic kidney disease: Secondary | ICD-10-CM | POA: Diagnosis not present

## 2022-03-26 DIAGNOSIS — Z992 Dependence on renal dialysis: Secondary | ICD-10-CM | POA: Diagnosis not present

## 2022-03-26 DIAGNOSIS — D509 Iron deficiency anemia, unspecified: Secondary | ICD-10-CM | POA: Diagnosis not present

## 2022-03-26 DIAGNOSIS — N186 End stage renal disease: Secondary | ICD-10-CM | POA: Diagnosis not present

## 2022-03-28 DIAGNOSIS — D631 Anemia in chronic kidney disease: Secondary | ICD-10-CM | POA: Diagnosis not present

## 2022-03-28 DIAGNOSIS — N2581 Secondary hyperparathyroidism of renal origin: Secondary | ICD-10-CM | POA: Diagnosis not present

## 2022-03-28 DIAGNOSIS — D509 Iron deficiency anemia, unspecified: Secondary | ICD-10-CM | POA: Diagnosis not present

## 2022-03-28 DIAGNOSIS — Z992 Dependence on renal dialysis: Secondary | ICD-10-CM | POA: Diagnosis not present

## 2022-03-28 DIAGNOSIS — N186 End stage renal disease: Secondary | ICD-10-CM | POA: Diagnosis not present

## 2022-03-28 DIAGNOSIS — E1122 Type 2 diabetes mellitus with diabetic chronic kidney disease: Secondary | ICD-10-CM | POA: Diagnosis not present

## 2022-03-28 NOTE — Progress Notes (Unsigned)
Bradley Soto, [place] [address]   CLINIC:  Medical Oncology/Hematology  PCP:  [name], [place] [address] [phone]   REASON FOR VISIT:  Follow-up for macrocytic anemia   CURRENT THERAPY: IV iron and Epogen via nephrology  INTERVAL HISTORY:   Mr. Dambrosio 82 y.o. male returns for routine follow-up of macrocytic anemia.  He was last seen by Tarri Abernethy PA-C on 01/19/2022.   At today's visit, he reports feeling fair.  He denies hospitalizations, surgeries, or new diagnoses since his last visit.  He continues to take vitamin B12 1000 mcg daily and copper 4 mg daily. He continues to report easy bruising but denies any abnormal bleeding.  No B symptoms. Dyspnea on exertion is improved.  No pica, headaches, chest pain, lightheadedness, or syncope.   He has 75% energy and 100% appetite. He endorses that he is maintaining a stable weight.   ASSESSMENT & PLAN:  1.  Normocytic to macrocytic anemia - Patient seen at the request of Dr. Marval Regal. - Patient is on hemodialysis for ESRD since 2021. - Patient receives Venofer 50 mg weekly via dialysis. - He receives Mircera 225 mcg weekly at dialysis. - Mild to moderate anemia since October 2020 - Last colonoscopy was 10 years ago in [place], [place] - PRBC transfusion on 10/24/2021 (Hgb 6.7).  PRBC transfusion 11/20/2021 via nephrology. - Hemoccult stool positive on 10/24/2021.  Patient has declined any further colonoscopy. - Hematology work-up (11/14/2021): Reticulocytes 2.0%.  Normal LDH and haptoglobin.  DAT negative. Normal B12 375, with significantly elevated MMA 1366 (difficult to interpret in setting of ESRD).  Normal folate. Significant copper deficiency (26) Negative H. pylori, hepatitis C.  No sign of active hepatitis B. Negative RF and ANA. Elevated ferritin 1248.  Iron saturation 10% with low serum iron 29.  TIBC 300. SPEP negative. - B12 injection  1000 mcg on 12/15/2021, started on B12 1000 mcg tablets daily, plus copper 4 mg tablets daily. - Labs checked in November 2023 after 1 month of B12/copper repletion shows hemoglobin trending upward at 9.1 with platelets trending upward at 130.  Labs showed elevated B12 1099/elevated but improved MMA 926 (difficult to interpret in the setting of ESRD).  Copper is improved at 78. - Most recent labs (03/23/2022): Hgb 11.2/MCV 94.4.  Vitamin B12 969.  Methylmalonic acid elevated but improved at 812.  Copper PENDING.   - Initially presented with severe fatigue, which improved after starting B12 and copper supplementation - Denies rectal bleeding or melena. - Likely combination anemia from copper deficiency, possible B12 deficiency, ESRD with functional iron deficiency, and bleeding (Hemoccult positive stool).  Early MDS is also a possibility. - PLAN: Macrocytic anemia significantly improved after starting B12 and copper supplements. - Continue copper 4 mg daily + vitamin B12 1000 mcg daily - Repeat labs (CBC/D, copper, B12, MMA) in 4 months with OFFICE visit 2 weeks after labs - If any recurrent macrocytic anemia despite adequate vitamin/mineral levels in the future, would still consider possible need for bone marrow biopsy.   2.  Mild thrombocytopenia - Platelet count has been intermittently low since 2015  Hematology work-up (11/14/2021): Normal B12 375, with significantly elevated MMA 1366 (difficult to interpret in setting of ESRD).  Normal folate. Significant copper deficiency (26) Negative H. pylori, hepatitis C.  No sign of active hepatitis B. Negative RF and ANA. SPEP negative. - Splenic ultrasound (12/03/2021): Spleen upper limits of normal at 13 cm  with volume 554.6 cc - Reports easy bruising.  No petechial rash or B symptoms. - Most recent CBC (03/23/2022): Platelets overall stable at 115 - PLAN: Continue vitamin supplementation and monitoring as above. - Consider bone marrow biopsy if any  changes from baseline.     3.  Social/history: - Lives with wife at home.  He is independent of ADLs and IADLs. 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  Quit smoking at age 42. - Sister had breast cancer.  PLAN SUMMARY: >> Labs in 4 months (CBC/D, vitamin B12, MMA, copper) >> OFFICE visit in 4 months (TWO weeks after labs)     REVIEW OF SYSTEMS:   Review of Systems  Constitutional:  Positive for fatigue. Negative for appetite change, chills, diaphoresis, fever and unexpected weight change.  HENT:   Negative for lump/mass and nosebleeds.   Eyes:  Negative for eye problems.  Respiratory:  Negative for cough, hemoptysis and shortness of breath.   Cardiovascular:  Negative for chest pain, leg swelling and palpitations.  Gastrointestinal:  Negative for abdominal pain, blood in stool, constipation, diarrhea, nausea and vomiting.  Genitourinary:  Negative for hematuria.   Skin: Negative.   Neurological:  Positive for headaches and numbness. Negative for dizziness and light-headedness.  Hematological:  Does not bruise/bleed easily.  Psychiatric/Behavioral:  Positive for sleep disturbance.      PHYSICAL EXAM:  ECOG PERFORMANCE STATUS: 2 - Symptomatic, <50% confined to bed  There were no vitals filed for this visit. There were no vitals filed for this visit. Physical Exam Constitutional:      Appearance: Normal appearance. He is obese.     Comments: Presents in wheelchair  HENT:     Head: Normocephalic and atraumatic.     Mouth/Throat:     Mouth: Mucous membranes are moist.  Eyes:     Extraocular Movements: Extraocular movements intact.     Pupils: Pupils are equal, round, and reactive to light.  Cardiovascular:     Rate and Rhythm: Normal rate and regular rhythm.     Pulses: Normal pulses.     Heart sounds: Murmur (perviously noted by cardiologist, Dr. [name]) heard.  Pulmonary:     Effort: Pulmonary effort is normal.     Breath sounds:  Normal breath sounds.     Comments: Decreased breath sounds Abdominal:     General: Bowel sounds are normal.     Palpations: Abdomen is soft.     Tenderness: There is no abdominal tenderness.  Musculoskeletal:        General: No swelling.     Right lower leg: No edema.     Left lower leg: No edema.  Lymphadenopathy:     Cervical: No cervical adenopathy.  Skin:    General: Skin is warm and dry.     Findings: Bruising present.  Neurological:     General: No focal deficit present.     Mental Status: He is alert and oriented to person, place, and time.  Psychiatric:        Mood and Affect: Mood normal.        Behavior: Behavior normal.     PAST MEDICAL/SURGICAL HISTORY:  Past Medical History:  Diagnosis Date   Anxiety    Atrial fibrillation (HCC)    Bell's palsy    left side of face droops   CAD (coronary artery disease)    s/p stent placement   CKD (chronic kidney disease), stage III ([facility])  [place]- TTHSat- [facility]   Colon polyps    Diabetes mellitus without complication ([facility])    Gout    History of blood transfusion    History of kidney stones    Passes   HLD (hyperlipidemia)    HTN (hypertension)    Insomnia    MI (myocardial infarction) ([facility]) 1998   Neuropathy    NSTEMI (non-ST elevated myocardial infarction) ([facility])    s/p PCI in early 2000s   OSA on CPAP    wears CPAP   Presence of permanent cardiac pacemaker    SSS (sick sinus syndrome) (HCC)    Type II or unspecified type diabetes mellitus without mention of complication, not stated as uncontrolled    Wears glasses    reading    Wears partial dentures    Past Surgical History:  Procedure Laterality Date   APPENDECTOMY     AV FISTULA PLACEMENT Right 07/28/2019   Procedure: RIGHT ARM Brachiocephalic ARTERIOVENOUS (AV) FISTULA CREATION;  Surgeon: Serafina Mitchell, MD;  [facility];  Service: Vascular;  Laterality: Right;   CATARACT EXTRACTION     CLIPPING OF ATRIAL APPENDAGE  12/05/2018    Procedure: Clipping Of Atrial Appendage;  Surgeon: Wonda Olds, MD;  Location: [facility];  Service: Open Heart Surgery;;   CORONARY ANGIOPLASTY WITH STENT PLACEMENT     x4   CORONARY ARTERY BYPASS GRAFT N/A 12/05/2018   Procedure: CORONARY ARTERY BYPASS GRAFTING (CABG) X FOUR USING LEFT INTERNAL MAMMARY ARTERY AND RIGHT SAPHENOUS VEIN GRAFTS;  Surgeon: Wonda Olds, MD;  Location: [facility];  Service: Open Heart Surgery;  Laterality: N/A;   HERNIA REPAIR     ventral hernia repair with mesh--revision also was required   INSERT / REPLACE / REMOVE PACEMAKER  05/10/2000   pacemaker implanted in [place]]   IR THORACENTESIS ASP PLEURAL SPACE W/IMG GUIDE  12/21/2018   IR THORACENTESIS ASP PLEURAL SPACE W/IMG GUIDE  12/30/2018   LEFT HEART CATH AND CORONARY ANGIOGRAPHY N/A 11/30/2018   Procedure: LEFT HEART CATH AND CORONARY ANGIOGRAPHY;  Surgeon: Burnell Blanks, MD;  Location: [facility];  Service: Cardiovascular;  Laterality: N/A;   MAZE N/A 12/05/2018   Procedure: MAZE;  Surgeon: Wonda Olds, MD;  Location: [facility];  Service: Open Heart Surgery;  Laterality: N/A;   PACEMAKER GENERATOR CHANGE  09/03/09   MDT Adapta generator change in [place]  2/2   polyp that could not be removed on colonoscopy   PPM GENERATOR CHANGEOUT N/A 06/01/2019   Procedure: PPM GENERATOR CHANGEOUT;  Surgeon: [name], MD;  Location: [facility];  Service: Cardiovascular;  Laterality: N/A;   REVISON OF ARTERIOVENOUS FISTULA Right 01/24/2020   Procedure: BANDING OF RIGHT ARM ARTERIOVENOUS FISTULA;  Surgeon: Serafina Mitchell, MD;  [facility];  Service: Vascular;  Laterality: Right;   TEE WITHOUT CARDIOVERSION N/A 12/05/2018   Procedure: TRANSESOPHAGEAL ECHOCARDIOGRAM (TEE);  Surgeon: Wonda Olds, MD;  Location: [facility];  Service: Open Heart Surgery;  Laterality: N/A;    SOCIAL HISTORY:  Social History   Socioeconomic History   Marital status: Married     Spouse name: Not on file   Number of children: Not on file   Years of education: Not on file   Highest education level: Not on file  Occupational History   Not on file  Tobacco Use   Smoking status: Former    Types: Cigarettes   Smokeless tobacco: Never  Tobacco comments:    " many years ago"  Vaping Use   Vaping Use: Never used  Substance and Sexual Activity   Alcohol use: No   Drug use: No   Sexual activity: Not on file  Other Topics Concern   Not on file  Social History Narrative   Lives with wife   Caffeine coffee 2 daily   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   Social Determinants of Health   Financial Resource Strain: Not on file  Food Insecurity: Not on file  Transportation Needs: Not on file  Physical Activity: Not on file  Stress: Not on file  Social Connections: Not on file  Intimate Partner Violence: Not on file    FAMILY HISTORY:  Family History  Problem Relation Age of Onset   Heart attack Father 2   Heart disease Father    Breast cancer Sister    Pulmonary embolism Sister    Deep vein thrombosis Sister     CURRENT MEDICATIONS:  Outpatient Encounter Medications as of 03/30/2022  Medication Sig   allopurinol (ZYLOPRIM) 100 MG tablet Take 1 tablet (100 mg total) by mouth daily.   aspirin EC 81 MG tablet Take 81 mg by mouth daily.   B Complex-C-Zn-Folic Acid (DIALYVITE Q000111Q WITH ZINC) 0.8 MG TABS Take 1 tablet by mouth at bedtime.   CALCITRIOL PO Take 4-5 tablets by mouth See admin instructions. Patient takes 4-5 calcitrol vitamin d tablets (1.42m per patient's spouse) during dialysis (T, TH, Sat).   Copper 5 MG TABS Take 1 tablet (5 mg total) by mouth daily.   CVS VITAMIN B12 1000 MCG tablet TAKE 1 TABLET BY MOUTH EVERY DAY   gabapentin (NEURONTIN) 100 MG capsule Take 1 capsule (100 mg total) by mouth 3 (three) times daily.   iron sucrose in sodium chloride 0.9 % 100 mL once a week.   Methoxy PEG-Epoetin Beta (MIRCERA IJ) Every two weeks at  dialysis   nitroGLYCERIN (NITROSTAT) 0.4 MG SL tablet Place 1 tablet (0.4 mg total) under the tongue every 5 (five) minutes as needed for chest pain.   NOVOLOG MIX 70/30 FLEXPEN (70-30) 100 UNIT/ML FlexPen Inject 30 Units into the skin daily. 30 unit Am and 30 units PM   rosuvastatin (CRESTOR) 5 MG tablet Take 5 mg by mouth every other day. Patient takes on Mondays, Wednesdays, Fridays and Sundays.   sevelamer carbonate (RENVELA) 800 MG tablet Take 1 tablet (800 mg total) by mouth 3 (three) times daily with meals.   No facility-administered encounter medications on file as of 03/30/2022.    ALLERGIES:  Allergies  Allergen Reactions   Other Other (See Comments)   Shellfish Allergy Rash    LABORATORY DATA:  I have reviewed the labs as listed.  CBC    Component Value Date/Time   WBC 6.2 03/23/2022 1457   RBC 3.92 (L) 03/23/2022 1457   HGB 11.2 (L) 03/23/2022 1457   HGB 12.5 (L) 05/30/2019 0923   HCT 37.0 (L) 03/23/2022 1457   HCT 38.8 05/30/2019 0923   PLT 115 (L) 03/23/2022 1457   PLT 136 (L) 05/30/2019 0923   MCV 94.4 03/23/2022 1457   MCV 95 05/30/2019 0923   MCH 28.6 03/23/2022 1457   MCHC 30.3 03/23/2022 1457   RDW 18.4 (H) 03/23/2022 1457   RDW 16.3 (H) 05/30/2019 0923   LYMPHSABS 0.9 03/23/2022 1457   LYMPHSABS 1.7 05/30/2019 0923   MONOABS 0.5 03/23/2022 1457   EOSABS 0.3 03/23/2022 1457   EOSABS  0.5 (H) 05/30/2019 0923   BASOSABS 0.0 03/23/2022 1457   BASOSABS 0.0 05/30/2019 0923      Latest Ref Rng & Units 10/24/2021    7:28 AM 01/24/2020    7:06 AM 11/03/2019    5:33 AM  CMP  Glucose 70 - 99 mg/dL 293  139  196   BUN 8 - 23 mg/dL 40  40  75   Creatinine 0.61 - 1.24 mg/dL 6.13  3.20  6.51   Sodium 135 - 145 mmol/L 139  140  139   Potassium 3.5 - 5.1 mmol/L 4.2  4.4  3.9   Chloride 98 - 111 mmol/L 97  102  108   CO2 22 - 32 mmol/L 28   22   Calcium 8.9 - 10.3 mg/dL 9.1   7.1   Total Protein 6.5 - 8.1 g/dL 6.9     Total Bilirubin 0.3 - 1.2 mg/dL 0.9      Alkaline Phos 38 - 126 U/L 87     AST 15 - 41 U/L 25     ALT 0 - 44 U/L 35       DIAGNOSTIC IMAGING:  I have independently reviewed the relevant imaging and discussed with the patient.   WRAP UP:  All questions were answered. The patient knows to call the clinic with any problems, questions or concerns.  Medical decision making: Moderate  Time spent on visit: I spent 20 minutes counseling the patient face to face. The total time spent in the appointment was 30 minutes and more than 50% was on counseling.  Harriett Rush, PA-C  03/30/22 10:44 AM

## 2022-03-30 ENCOUNTER — Inpatient Hospital Stay (HOSPITAL_BASED_OUTPATIENT_CLINIC_OR_DEPARTMENT_OTHER): Payer: Medicare Other | Admitting: Physician Assistant

## 2022-03-30 ENCOUNTER — Other Ambulatory Visit: Payer: Self-pay

## 2022-03-30 VITALS — BP 151/69 | HR 78 | Temp 97.9°F | Resp 18 | Wt 216.9 lb

## 2022-03-30 DIAGNOSIS — D631 Anemia in chronic kidney disease: Secondary | ICD-10-CM | POA: Diagnosis not present

## 2022-03-30 DIAGNOSIS — D696 Thrombocytopenia, unspecified: Secondary | ICD-10-CM

## 2022-03-30 DIAGNOSIS — I120 Hypertensive chronic kidney disease with stage 5 chronic kidney disease or end stage renal disease: Secondary | ICD-10-CM | POA: Diagnosis not present

## 2022-03-30 DIAGNOSIS — E610 Copper deficiency: Secondary | ICD-10-CM | POA: Diagnosis not present

## 2022-03-30 DIAGNOSIS — E538 Deficiency of other specified B group vitamins: Secondary | ICD-10-CM | POA: Diagnosis not present

## 2022-03-30 DIAGNOSIS — N186 End stage renal disease: Secondary | ICD-10-CM | POA: Diagnosis not present

## 2022-03-30 DIAGNOSIS — R7989 Other specified abnormal findings of blood chemistry: Secondary | ICD-10-CM | POA: Diagnosis not present

## 2022-03-30 DIAGNOSIS — D539 Nutritional anemia, unspecified: Secondary | ICD-10-CM | POA: Diagnosis not present

## 2022-03-30 DIAGNOSIS — D509 Iron deficiency anemia, unspecified: Secondary | ICD-10-CM | POA: Diagnosis not present

## 2022-03-30 LAB — COPPER, SERUM: Copper: 85 ug/dL (ref 69–132)

## 2022-03-30 LAB — METHYLMALONIC ACID, SERUM: Methylmalonic Acid, Quantitative: 812 nmol/L — ABNORMAL HIGH (ref 0–378)

## 2022-03-30 NOTE — Patient Instructions (Signed)
[facility] at [facility] **   You were seen today by Tarri Abernethy PA-C for your anemia.    ANEMIA & LOW PLATELETS: Your blood levels are improving after you started B12 and copper.  CONTINUE taking copper 4 mg (two 2 mg tablets) daily & vitamin B12 1000 mcg daily  LABS: Return in 4 months for repeat labs  FOLLOW-UP APPOINTMENT: Office visit in 4 months, 2 weeks after labs  ** Thank you for trusting me with your healthcare!  I strive to provide all of my patients with quality care at each visit.  If you receive a survey for this visit, I would be so grateful to you for taking the time to provide feedback.  Thank you in advance!  ~ Daila Elbert                   Dr. Derek Jack   &   Tarri Abernethy, PA-C   - - - - - - - - - - - - - - - - - -    Thank you for choosing [facility] at [facility] to provide your oncology and hematology care.  To afford each patient quality time with our provider, please arrive at least 15 minutes before your scheduled appointment time.   If you have a lab appointment with the [facility] please come in thru the Main Entrance and check in at the main information desk.  You need to re-schedule your appointment should you arrive 10 or more minutes late.  We strive to give you quality time with our providers, and arriving late affects you and other patients whose appointments are after yours.  Also, if you no show three or more times for appointments you may be dismissed from the clinic at the providers discretion.     Again, thank you for choosing [facility].  Our hope is that these requests will decrease the amount of time that you wait before being seen by our physicians.       _____________________________________________________________  Should you have questions after your visit to [facility], please contact our office at [phone] and follow the prompts.  Our office hours are 8:00 a.m. and 4:30 p.m. Monday - Friday.  Please note that voicemails left after 4:00 p.m. may not be returned until the following business day.  We are closed weekends and major holidays.  You do have access to a nurse 24-7, just call the main number to the clinic [phone] and do not press any options, hold on the line and a nurse will answer the phone.    For prescription refill requests, have your pharmacy contact our office and allow 72 hours.

## 2022-03-31 DIAGNOSIS — D509 Iron deficiency anemia, unspecified: Secondary | ICD-10-CM | POA: Diagnosis not present

## 2022-03-31 DIAGNOSIS — D631 Anemia in chronic kidney disease: Secondary | ICD-10-CM | POA: Diagnosis not present

## 2022-03-31 DIAGNOSIS — N2581 Secondary hyperparathyroidism of renal origin: Secondary | ICD-10-CM | POA: Diagnosis not present

## 2022-03-31 DIAGNOSIS — Z992 Dependence on renal dialysis: Secondary | ICD-10-CM | POA: Diagnosis not present

## 2022-03-31 DIAGNOSIS — E1122 Type 2 diabetes mellitus with diabetic chronic kidney disease: Secondary | ICD-10-CM | POA: Diagnosis not present

## 2022-03-31 DIAGNOSIS — N186 End stage renal disease: Secondary | ICD-10-CM | POA: Diagnosis not present

## 2022-04-02 DIAGNOSIS — E1122 Type 2 diabetes mellitus with diabetic chronic kidney disease: Secondary | ICD-10-CM | POA: Diagnosis not present

## 2022-04-02 DIAGNOSIS — Z992 Dependence on renal dialysis: Secondary | ICD-10-CM | POA: Diagnosis not present

## 2022-04-02 DIAGNOSIS — D631 Anemia in chronic kidney disease: Secondary | ICD-10-CM | POA: Diagnosis not present

## 2022-04-02 DIAGNOSIS — N186 End stage renal disease: Secondary | ICD-10-CM | POA: Diagnosis not present

## 2022-04-02 DIAGNOSIS — N2581 Secondary hyperparathyroidism of renal origin: Secondary | ICD-10-CM | POA: Diagnosis not present

## 2022-04-02 DIAGNOSIS — D509 Iron deficiency anemia, unspecified: Secondary | ICD-10-CM | POA: Diagnosis not present

## 2022-04-04 DIAGNOSIS — Z992 Dependence on renal dialysis: Secondary | ICD-10-CM | POA: Diagnosis not present

## 2022-04-04 DIAGNOSIS — D509 Iron deficiency anemia, unspecified: Secondary | ICD-10-CM | POA: Diagnosis not present

## 2022-04-04 DIAGNOSIS — N186 End stage renal disease: Secondary | ICD-10-CM | POA: Diagnosis not present

## 2022-04-04 DIAGNOSIS — N2581 Secondary hyperparathyroidism of renal origin: Secondary | ICD-10-CM | POA: Diagnosis not present

## 2022-04-04 DIAGNOSIS — E1122 Type 2 diabetes mellitus with diabetic chronic kidney disease: Secondary | ICD-10-CM | POA: Diagnosis not present

## 2022-04-04 DIAGNOSIS — D631 Anemia in chronic kidney disease: Secondary | ICD-10-CM | POA: Diagnosis not present

## 2022-04-07 DIAGNOSIS — D509 Iron deficiency anemia, unspecified: Secondary | ICD-10-CM | POA: Diagnosis not present

## 2022-04-07 DIAGNOSIS — N186 End stage renal disease: Secondary | ICD-10-CM | POA: Diagnosis not present

## 2022-04-07 DIAGNOSIS — Z992 Dependence on renal dialysis: Secondary | ICD-10-CM | POA: Diagnosis not present

## 2022-04-07 DIAGNOSIS — N2581 Secondary hyperparathyroidism of renal origin: Secondary | ICD-10-CM | POA: Diagnosis not present

## 2022-04-07 DIAGNOSIS — E1122 Type 2 diabetes mellitus with diabetic chronic kidney disease: Secondary | ICD-10-CM | POA: Diagnosis not present

## 2022-04-07 DIAGNOSIS — D631 Anemia in chronic kidney disease: Secondary | ICD-10-CM | POA: Diagnosis not present

## 2022-04-09 DIAGNOSIS — N186 End stage renal disease: Secondary | ICD-10-CM | POA: Diagnosis not present

## 2022-04-09 DIAGNOSIS — E039 Hypothyroidism, unspecified: Secondary | ICD-10-CM | POA: Diagnosis not present

## 2022-04-09 DIAGNOSIS — D509 Iron deficiency anemia, unspecified: Secondary | ICD-10-CM | POA: Diagnosis not present

## 2022-04-09 DIAGNOSIS — D631 Anemia in chronic kidney disease: Secondary | ICD-10-CM | POA: Diagnosis not present

## 2022-04-09 DIAGNOSIS — E1122 Type 2 diabetes mellitus with diabetic chronic kidney disease: Secondary | ICD-10-CM | POA: Diagnosis not present

## 2022-04-09 DIAGNOSIS — N2581 Secondary hyperparathyroidism of renal origin: Secondary | ICD-10-CM | POA: Diagnosis not present

## 2022-04-09 DIAGNOSIS — Z992 Dependence on renal dialysis: Secondary | ICD-10-CM | POA: Diagnosis not present

## 2022-04-11 DIAGNOSIS — D631 Anemia in chronic kidney disease: Secondary | ICD-10-CM | POA: Diagnosis not present

## 2022-04-11 DIAGNOSIS — N186 End stage renal disease: Secondary | ICD-10-CM | POA: Diagnosis not present

## 2022-04-11 DIAGNOSIS — D509 Iron deficiency anemia, unspecified: Secondary | ICD-10-CM | POA: Diagnosis not present

## 2022-04-11 DIAGNOSIS — N2581 Secondary hyperparathyroidism of renal origin: Secondary | ICD-10-CM | POA: Diagnosis not present

## 2022-04-11 DIAGNOSIS — Z992 Dependence on renal dialysis: Secondary | ICD-10-CM | POA: Diagnosis not present

## 2022-04-11 DIAGNOSIS — E1122 Type 2 diabetes mellitus with diabetic chronic kidney disease: Secondary | ICD-10-CM | POA: Diagnosis not present

## 2022-04-14 DIAGNOSIS — N2581 Secondary hyperparathyroidism of renal origin: Secondary | ICD-10-CM | POA: Diagnosis not present

## 2022-04-14 DIAGNOSIS — E1122 Type 2 diabetes mellitus with diabetic chronic kidney disease: Secondary | ICD-10-CM | POA: Diagnosis not present

## 2022-04-14 DIAGNOSIS — Z992 Dependence on renal dialysis: Secondary | ICD-10-CM | POA: Diagnosis not present

## 2022-04-14 DIAGNOSIS — D631 Anemia in chronic kidney disease: Secondary | ICD-10-CM | POA: Diagnosis not present

## 2022-04-14 DIAGNOSIS — N186 End stage renal disease: Secondary | ICD-10-CM | POA: Diagnosis not present

## 2022-04-14 DIAGNOSIS — D509 Iron deficiency anemia, unspecified: Secondary | ICD-10-CM | POA: Diagnosis not present

## 2022-04-16 DIAGNOSIS — N2581 Secondary hyperparathyroidism of renal origin: Secondary | ICD-10-CM | POA: Diagnosis not present

## 2022-04-16 DIAGNOSIS — E1122 Type 2 diabetes mellitus with diabetic chronic kidney disease: Secondary | ICD-10-CM | POA: Diagnosis not present

## 2022-04-16 DIAGNOSIS — D631 Anemia in chronic kidney disease: Secondary | ICD-10-CM | POA: Diagnosis not present

## 2022-04-16 DIAGNOSIS — D509 Iron deficiency anemia, unspecified: Secondary | ICD-10-CM | POA: Diagnosis not present

## 2022-04-16 DIAGNOSIS — Z992 Dependence on renal dialysis: Secondary | ICD-10-CM | POA: Diagnosis not present

## 2022-04-16 DIAGNOSIS — N186 End stage renal disease: Secondary | ICD-10-CM | POA: Diagnosis not present

## 2022-04-17 ENCOUNTER — Emergency Department (HOSPITAL_COMMUNITY): Payer: Medicare Other

## 2022-04-17 ENCOUNTER — Encounter (HOSPITAL_COMMUNITY): Payer: Self-pay

## 2022-04-17 ENCOUNTER — Emergency Department (HOSPITAL_COMMUNITY)
Admission: EM | Admit: 2022-04-17 | Discharge: 2022-04-17 | Disposition: A | Discharge status: Home / Self Care | Payer: Medicare Other | Attending: Emergency Medicine | Admitting: Emergency Medicine

## 2022-04-17 ENCOUNTER — Other Ambulatory Visit: Payer: Self-pay

## 2022-04-17 DIAGNOSIS — Z7982 Long term (current) use of aspirin: Secondary | ICD-10-CM | POA: Diagnosis not present

## 2022-04-17 DIAGNOSIS — N186 End stage renal disease: Secondary | ICD-10-CM | POA: Diagnosis not present

## 2022-04-17 DIAGNOSIS — I5030 Unspecified diastolic (congestive) heart failure: Secondary | ICD-10-CM

## 2022-04-17 DIAGNOSIS — Z794 Long term (current) use of insulin: Secondary | ICD-10-CM | POA: Diagnosis not present

## 2022-04-17 DIAGNOSIS — I110 Hypertensive heart disease with heart failure: Secondary | ICD-10-CM | POA: Diagnosis not present

## 2022-04-17 DIAGNOSIS — Z951 Presence of aortocoronary bypass graft: Secondary | ICD-10-CM | POA: Diagnosis not present

## 2022-04-17 DIAGNOSIS — I509 Heart failure, unspecified: Secondary | ICD-10-CM | POA: Insufficient documentation

## 2022-04-17 DIAGNOSIS — Z1152 Encounter for screening for COVID-19: Secondary | ICD-10-CM | POA: Insufficient documentation

## 2022-04-17 DIAGNOSIS — J90 Pleural effusion, not elsewhere classified: Secondary | ICD-10-CM | POA: Diagnosis not present

## 2022-04-17 DIAGNOSIS — R0602 Shortness of breath: Secondary | ICD-10-CM | POA: Diagnosis not present

## 2022-04-17 DIAGNOSIS — Z992 Dependence on renal dialysis: Secondary | ICD-10-CM | POA: Diagnosis not present

## 2022-04-17 DIAGNOSIS — I129 Hypertensive chronic kidney disease with stage 1 through stage 4 chronic kidney disease, or unspecified chronic kidney disease: Secondary | ICD-10-CM | POA: Diagnosis not present

## 2022-04-17 LAB — CBC WITH DIFFERENTIAL/PLATELET
Abs Immature Granulocytes: 0.06 10*3/uL (ref 0.00–0.07)
Basophils Absolute: 0.1 10*3/uL (ref 0.0–0.1)
Basophils Relative: 1 %
Eosinophils Absolute: 0.2 10*3/uL (ref 0.0–0.5)
Eosinophils Relative: 2 %
HCT: 37.8 % — ABNORMAL LOW (ref 39.0–52.0)
Hemoglobin: 11.7 g/dL — ABNORMAL LOW (ref 13.0–17.0)
Immature Granulocytes: 1 %
Lymphocytes Relative: 11 %
Lymphs Abs: 1 10*3/uL (ref 0.7–4.0)
MCH: 29 pg (ref 26.0–34.0)
MCHC: 31 g/dL (ref 30.0–36.0)
MCV: 93.8 fL (ref 80.0–100.0)
Monocytes Absolute: 0.7 10*3/uL (ref 0.1–1.0)
Monocytes Relative: 8 %
Neutro Abs: 6.7 10*3/uL (ref 1.7–7.7)
Neutrophils Relative %: 77 %
Platelets: 121 10*3/uL — ABNORMAL LOW (ref 150–400)
RBC: 4.03 MIL/uL — ABNORMAL LOW (ref 4.22–5.81)
RDW: 17 % — ABNORMAL HIGH (ref 11.5–15.5)
WBC: 8.6 10*3/uL (ref 4.0–10.5)
nRBC: 0 % (ref 0.0–0.2)

## 2022-04-17 LAB — CBG MONITORING, ED: Glucose-Capillary: 180 mg/dL — ABNORMAL HIGH (ref 70–99)

## 2022-04-17 LAB — RESP PANEL BY RT-PCR (RSV, FLU A&B, COVID)  RVPGX2
Influenza A by PCR: NEGATIVE
Influenza B by PCR: NEGATIVE
Resp Syncytial Virus by PCR: NEGATIVE
SARS Coronavirus 2 by RT PCR: NEGATIVE

## 2022-04-17 LAB — BASIC METABOLIC PANEL
Anion gap: 8 (ref 5–15)
BUN: 47 mg/dL — ABNORMAL HIGH (ref 8–23)
CO2: 26 mmol/L (ref 22–32)
Calcium: 9.8 mg/dL (ref 8.9–10.3)
Chloride: 103 mmol/L (ref 98–111)
Creatinine, Ser: 5.31 mg/dL — ABNORMAL HIGH (ref 0.61–1.24)
GFR, Estimated: 10 mL/min — ABNORMAL LOW (ref >60)
Glucose, Bld: 178 mg/dL — ABNORMAL HIGH (ref 70–99)
Potassium: 4.9 mmol/L (ref 3.5–5.1)
Sodium: 137 mmol/L (ref 135–145)

## 2022-04-17 LAB — TROPONIN I (HIGH SENSITIVITY)
Troponin I (High Sensitivity): 61 ng/L — ABNORMAL HIGH (ref <18)
Troponin I (High Sensitivity): 69 ng/L — ABNORMAL HIGH (ref <18)

## 2022-04-17 LAB — BRAIN NATRIURETIC PEPTIDE: B Natriuretic Peptide: 1182 pg/mL — ABNORMAL HIGH (ref 0.0–100.0)

## 2022-04-17 MED ORDER — FUROSEMIDE 10 MG/ML IJ SOLN
80.0000 mg | Freq: Once | INTRAMUSCULAR | Status: AC
  Administered 2022-04-17: 80 mg via INTRAVENOUS
  Filled 2022-04-17: qty 8

## 2022-04-17 NOTE — Discharge Instructions (Signed)
Try not to drink any more fluids then you are supposed to.  Continue going to your dialysis as scheduled.  Follow-up with your cardiologist in the next 2 to 3 weeks.

## 2022-04-17 NOTE — ED Notes (Signed)
Pt was uncomfortable in the bed so I put a recliner in the room. He says he is comfortable now.

## 2022-04-17 NOTE — ED Provider Notes (Signed)
[facility] Provider Note   CSN: [id number] Arrival date & time: 04/17/22  0556     History  Chief Complaint  Patient presents with   Shortness of Breath   Nausea    Bradley Soto is a 82 y.o. male.  Patient presents to the emergency department for evaluation of shortness of breath and nausea.  Patient reports that the symptoms were present through the night.  He has had some head congestion and increased phlegm in his throat.  No cough.  No fever.  Patient concerned that his symptoms may be secondary to his heart.  He reports a history of heart attack and bypass, has had "silent heart attacks" in the past.       Home Medications Prior to Admission medications   Medication Sig Start Date End Date Taking? Authorizing Provider  allopurinol (ZYLOPRIM) 100 MG tablet Take 1 tablet (100 mg total) by mouth daily. 11/03/19   [name], [name], MD  aspirin EC 81 MG tablet Take 81 mg by mouth daily.    [provider]  B Complex-C-Zn-Folic Acid (DIALYVITE Q000111Q WITH ZINC) 0.8 MG TABS Take 1 tablet by mouth at bedtime. 11/08/19   [provider]  CALCITRIOL PO Take 4-5 tablets by mouth See admin instructions. Patient takes 4-5 calcitrol vitamin d tablets (1.'75mg'$  per patient's spouse) during dialysis (T, TH, Sat).    [provider]  Copper 5 MG TABS Take 1 tablet (5 mg total) by mouth daily. 12/15/21   Harriett Rush, PA-C  CVS VITAMIN B12 1000 MCG tablet TAKE 1 TABLET BY MOUTH EVERY DAY 03/26/22   Tarri Abernethy M, PA-C  gabapentin (NEURONTIN) 100 MG capsule Take 1 capsule (100 mg total) by mouth 3 (three) times daily. 09/24/21   Waynetta Sandy, MD  iron sucrose in sodium chloride 0.9 % 100 mL once a week. 08/26/21 09/22/22  [provider]  Methoxy PEG-Epoetin Beta (MIRCERA IJ) Every two weeks at dialysis 01/25/21 06/13/22  [provider]  metoprolol tartrate (LOPRESSOR) 25 MG tablet  1/2 tablet orally 4 days a week on opposite days of dialysis 12/12/18   [provider]  nitroGLYCERIN (NITROSTAT) 0.4 MG SL tablet Place 1 tablet (0.4 mg total) under the tongue every 5 (five) minutes as needed for chest pain. Patient not taking: Reported on 03/30/2022 10/08/21   Nahser, [name], MD  NOVOLOG MIX 70/30 FLEXPEN (70-30) 100 UNIT/ML FlexPen Inject 30 Units into the skin daily. 30 unit Am and 30 units PM 02/26/16   [provider]  rosuvastatin (CRESTOR) 5 MG tablet Take 5 mg by mouth every other day. Patient takes on Mondays, Wednesdays, Fridays and Sundays. 10/31/20   [provider]  sevelamer carbonate (RENVELA) 800 MG tablet Take 1 tablet (800 mg total) by mouth 3 (three) times daily with meals. 11/03/19   [name], [name], MD      Allergies    Other and Shellfish allergy    Review of Systems   Review of Systems  Physical Exam Updated Vital Signs BP (!) 189/82   Pulse 99   Temp 98 F (36.7 C) (Oral)   Resp 14   Ht '5\' 11"'$  (1.803 m)   Wt 97.5 kg   SpO2 96%   BMI 29.99 kg/m  Physical Exam Vitals and nursing note reviewed.  Constitutional:      General: He is not in acute distress.    Appearance: He is well-developed.  HENT:  Head: Normocephalic and atraumatic.     Mouth/Throat:     Mouth: Mucous membranes are moist.  Eyes:     General: Vision grossly intact. Gaze aligned appropriately.     Extraocular Movements: Extraocular movements intact.     Conjunctiva/sclera: Conjunctivae normal.  Cardiovascular:     Rate and Rhythm: Normal rate and regular rhythm.     Pulses: Normal pulses.     Heart sounds: S1 normal and S2 normal. Murmur heard.     Systolic murmur is present with a grade of 4/6.     No friction rub. No gallop.  Pulmonary:     Effort: Pulmonary effort is normal. No respiratory distress.     Breath sounds: Normal breath sounds.  Abdominal:     Palpations: Abdomen is soft.     Tenderness: There is no abdominal  tenderness. There is no guarding or rebound.     Hernia: No hernia is present.  Musculoskeletal:        General: No swelling.     Cervical back: Full passive range of motion without pain, normal range of motion and neck supple. No pain with movement, spinous process tenderness or muscular tenderness. Normal range of motion.     Right lower leg: No edema.     Left lower leg: No edema.  Skin:    General: Skin is warm and dry.     Capillary Refill: Capillary refill takes less than 2 seconds.     Findings: No ecchymosis, erythema, lesion or wound.  Neurological:     Mental Status: He is alert and oriented to person, place, and time.     GCS: GCS eye subscore is 4. GCS verbal subscore is 5. GCS motor subscore is 6.     Cranial Nerves: Cranial nerves 2-12 are intact.     Sensory: Sensation is intact.     Motor: Motor function is intact. No weakness or abnormal muscle tone.     Coordination: Coordination is intact.  Psychiatric:        Mood and Affect: Mood normal.        Speech: Speech normal.        Behavior: Behavior normal.     ED Results / Procedures / Treatments   Labs (all labs ordered are listed, but only abnormal results are displayed) Labs Reviewed  CBG MONITORING, ED - Abnormal; Notable for the following components:      Result Value   Glucose-Capillary 180 (*)    All other components within normal limits  RESP PANEL BY RT-PCR (RSV, FLU A&B, COVID)  [id number]  CBC WITH DIFFERENTIAL/PLATELET  BASIC METABOLIC PANEL  BRAIN NATRIURETIC PEPTIDE  TROPONIN I (HIGH SENSITIVITY)    EKG EKG Interpretation  Date/Time:  Friday April 17 2022 06:15:55 EST Ventricular Rate:  101 PR Interval:  212 QRS Duration: 150 QT Interval:  374 QTC Calculation: 485 R Axis:   -21 Text Interpretation: Sinus tachycardia Borderline prolonged PR interval Right bundle branch block Inferior infarct, old No significant change since last tracing Confirmed by Orpah Greek [phone]) on 04/17/2022  6:33:55 AM  Radiology No results found.  Procedures Procedures    Medications Ordered in ED Medications - No data to display  ED Course/ Medical Decision Making/ A&P                             Medical Decision Making Amount and/or Complexity of Data Reviewed Labs: ordered. Radiology: ordered.  Differential Diagnosis: Angina, NSTEMI, pulmonary edema, pleural effusions, pneumonia, URI  Patient presents to the emergency department for evaluation of shortness of breath.  He has had some head congestion and increased phlegm in his throat.  Patient reports that his cardiac symptoms in the past, however, have primarily been shortness of breath.  Patient is end-stage renal disease, on hemodialysis.  Reviewing records reveals he does have history of pleural effusions requiring thoracentesis in the past.  Will initiate workup.  Labs, x-rays, EKG ordered.  EKG similar to prior, no evidence of ischemia or ST elevation.  Will be signed out to oncoming ER physician to follow results.        Final Clinical Impression(s) / ED Diagnoses Final diagnoses:  Shortness of breath    Rx / DC Orders ED Discharge Orders     None         Orpah Greek, MD 04/17/22 [phone]

## 2022-04-17 NOTE — ED Provider Notes (Signed)
Patient complains of mild shortness of breath.  He has congestive heart failure with a BNP of almost 1200.  I spoke with Dr. Harl Bowie cardiology and he suggested giving the patient 53 of Lasix and contacting nephrology.  I spoke to Dr. Moshe Cipro and she agrees with IV Lasix and she will call the patient's dialysis center and let them know that he needs more fluid taken off during the sessions.  The patient has been drinking too much fluids and was instructed to reduce his p.o. intake of fluids to the amount that is recommended by his doctors.   Bradley Ferguson, MD 04/17/22 1039

## 2022-04-17 NOTE — ED Triage Notes (Addendum)
Pt bib spouse with c/o sob with exertion, more than usual for the past couple of days. Pt reports being shaky, nervous, congested, and nauseated, says he wants his heart checked to make sure he didn't have a heart attack- pt denies any chest pain, but has cardiac hx

## 2022-04-18 DIAGNOSIS — N186 End stage renal disease: Secondary | ICD-10-CM | POA: Diagnosis not present

## 2022-04-18 DIAGNOSIS — N2581 Secondary hyperparathyroidism of renal origin: Secondary | ICD-10-CM | POA: Diagnosis not present

## 2022-04-18 DIAGNOSIS — Z992 Dependence on renal dialysis: Secondary | ICD-10-CM | POA: Diagnosis not present

## 2022-04-18 DIAGNOSIS — D631 Anemia in chronic kidney disease: Secondary | ICD-10-CM | POA: Diagnosis not present

## 2022-04-18 DIAGNOSIS — E1122 Type 2 diabetes mellitus with diabetic chronic kidney disease: Secondary | ICD-10-CM | POA: Diagnosis not present

## 2022-04-21 DIAGNOSIS — E1122 Type 2 diabetes mellitus with diabetic chronic kidney disease: Secondary | ICD-10-CM | POA: Diagnosis not present

## 2022-04-21 DIAGNOSIS — N2581 Secondary hyperparathyroidism of renal origin: Secondary | ICD-10-CM | POA: Diagnosis not present

## 2022-04-21 DIAGNOSIS — Z992 Dependence on renal dialysis: Secondary | ICD-10-CM | POA: Diagnosis not present

## 2022-04-21 DIAGNOSIS — N186 End stage renal disease: Secondary | ICD-10-CM | POA: Diagnosis not present

## 2022-04-21 DIAGNOSIS — D631 Anemia in chronic kidney disease: Secondary | ICD-10-CM | POA: Diagnosis not present

## 2022-04-23 ENCOUNTER — Telehealth: Payer: Self-pay | Admitting: *Deleted

## 2022-04-23 DIAGNOSIS — N186 End stage renal disease: Secondary | ICD-10-CM | POA: Diagnosis not present

## 2022-04-23 DIAGNOSIS — Z992 Dependence on renal dialysis: Secondary | ICD-10-CM | POA: Diagnosis not present

## 2022-04-23 DIAGNOSIS — D631 Anemia in chronic kidney disease: Secondary | ICD-10-CM | POA: Diagnosis not present

## 2022-04-23 DIAGNOSIS — N2581 Secondary hyperparathyroidism of renal origin: Secondary | ICD-10-CM | POA: Diagnosis not present

## 2022-04-23 DIAGNOSIS — E1122 Type 2 diabetes mellitus with diabetic chronic kidney disease: Secondary | ICD-10-CM | POA: Diagnosis not present

## 2022-04-23 NOTE — Telephone Encounter (Signed)
     Patient  visit on 04/17/2022  at [facility]  was for treatment  Have you been able to follow up with your primary care physician? Patient will follow up has nmedicine and transportation  The patient was able to obtain any needed medicine or equipment.  Are there diet recommendations that you are having difficulty following?  Patient expresses understanding of discharge instructions and education provided has no other needs at this time.    [place] [phone] [address] , [place] [address] Email : [email or web address]

## 2022-04-25 DIAGNOSIS — Z992 Dependence on renal dialysis: Secondary | ICD-10-CM | POA: Diagnosis not present

## 2022-04-25 DIAGNOSIS — N186 End stage renal disease: Secondary | ICD-10-CM | POA: Diagnosis not present

## 2022-04-25 DIAGNOSIS — D631 Anemia in chronic kidney disease: Secondary | ICD-10-CM | POA: Diagnosis not present

## 2022-04-25 DIAGNOSIS — E1122 Type 2 diabetes mellitus with diabetic chronic kidney disease: Secondary | ICD-10-CM | POA: Diagnosis not present

## 2022-04-25 DIAGNOSIS — N2581 Secondary hyperparathyroidism of renal origin: Secondary | ICD-10-CM | POA: Diagnosis not present

## 2022-04-28 DIAGNOSIS — N186 End stage renal disease: Secondary | ICD-10-CM | POA: Diagnosis not present

## 2022-04-28 DIAGNOSIS — E1122 Type 2 diabetes mellitus with diabetic chronic kidney disease: Secondary | ICD-10-CM | POA: Diagnosis not present

## 2022-04-28 DIAGNOSIS — Z992 Dependence on renal dialysis: Secondary | ICD-10-CM | POA: Diagnosis not present

## 2022-04-28 DIAGNOSIS — N2581 Secondary hyperparathyroidism of renal origin: Secondary | ICD-10-CM | POA: Diagnosis not present

## 2022-04-28 DIAGNOSIS — D631 Anemia in chronic kidney disease: Secondary | ICD-10-CM | POA: Diagnosis not present

## 2022-04-29 DIAGNOSIS — Z950 Presence of cardiac pacemaker: Secondary | ICD-10-CM | POA: Diagnosis not present

## 2022-04-29 DIAGNOSIS — N185 Chronic kidney disease, stage 5: Secondary | ICD-10-CM | POA: Diagnosis not present

## 2022-04-29 DIAGNOSIS — D649 Anemia, unspecified: Secondary | ICD-10-CM | POA: Diagnosis not present

## 2022-04-29 DIAGNOSIS — I480 Paroxysmal atrial fibrillation: Secondary | ICD-10-CM | POA: Diagnosis not present

## 2022-04-29 DIAGNOSIS — I5022 Chronic systolic (congestive) heart failure: Secondary | ICD-10-CM | POA: Diagnosis not present

## 2022-04-29 DIAGNOSIS — D696 Thrombocytopenia, unspecified: Secondary | ICD-10-CM | POA: Diagnosis not present

## 2022-04-29 DIAGNOSIS — R0602 Shortness of breath: Secondary | ICD-10-CM | POA: Diagnosis not present

## 2022-04-29 DIAGNOSIS — E213 Hyperparathyroidism, unspecified: Secondary | ICD-10-CM | POA: Diagnosis not present

## 2022-04-29 DIAGNOSIS — I495 Sick sinus syndrome: Secondary | ICD-10-CM | POA: Diagnosis not present

## 2022-04-29 DIAGNOSIS — I700 Atherosclerosis of aorta: Secondary | ICD-10-CM | POA: Diagnosis not present

## 2022-04-29 DIAGNOSIS — I770 Arteriovenous fistula, acquired: Secondary | ICD-10-CM | POA: Diagnosis not present

## 2022-04-29 DIAGNOSIS — Z992 Dependence on renal dialysis: Secondary | ICD-10-CM | POA: Diagnosis not present

## 2022-04-30 ENCOUNTER — Other Ambulatory Visit: Payer: Self-pay | Admitting: Internal Medicine

## 2022-04-30 DIAGNOSIS — D631 Anemia in chronic kidney disease: Secondary | ICD-10-CM | POA: Diagnosis not present

## 2022-04-30 DIAGNOSIS — Z992 Dependence on renal dialysis: Secondary | ICD-10-CM | POA: Diagnosis not present

## 2022-04-30 DIAGNOSIS — N2581 Secondary hyperparathyroidism of renal origin: Secondary | ICD-10-CM | POA: Diagnosis not present

## 2022-04-30 DIAGNOSIS — E1122 Type 2 diabetes mellitus with diabetic chronic kidney disease: Secondary | ICD-10-CM | POA: Diagnosis not present

## 2022-04-30 DIAGNOSIS — R911 Solitary pulmonary nodule: Secondary | ICD-10-CM

## 2022-04-30 DIAGNOSIS — N186 End stage renal disease: Secondary | ICD-10-CM | POA: Diagnosis not present

## 2022-05-02 DIAGNOSIS — N186 End stage renal disease: Secondary | ICD-10-CM | POA: Diagnosis not present

## 2022-05-02 DIAGNOSIS — D631 Anemia in chronic kidney disease: Secondary | ICD-10-CM | POA: Diagnosis not present

## 2022-05-02 DIAGNOSIS — E1122 Type 2 diabetes mellitus with diabetic chronic kidney disease: Secondary | ICD-10-CM | POA: Diagnosis not present

## 2022-05-02 DIAGNOSIS — Z992 Dependence on renal dialysis: Secondary | ICD-10-CM | POA: Diagnosis not present

## 2022-05-02 DIAGNOSIS — N2581 Secondary hyperparathyroidism of renal origin: Secondary | ICD-10-CM | POA: Diagnosis not present

## 2022-05-05 DIAGNOSIS — Z992 Dependence on renal dialysis: Secondary | ICD-10-CM | POA: Diagnosis not present

## 2022-05-05 DIAGNOSIS — N186 End stage renal disease: Secondary | ICD-10-CM | POA: Diagnosis not present

## 2022-05-05 DIAGNOSIS — E1122 Type 2 diabetes mellitus with diabetic chronic kidney disease: Secondary | ICD-10-CM | POA: Diagnosis not present

## 2022-05-05 DIAGNOSIS — D631 Anemia in chronic kidney disease: Secondary | ICD-10-CM | POA: Diagnosis not present

## 2022-05-05 DIAGNOSIS — N2581 Secondary hyperparathyroidism of renal origin: Secondary | ICD-10-CM | POA: Diagnosis not present

## 2022-05-07 DIAGNOSIS — D631 Anemia in chronic kidney disease: Secondary | ICD-10-CM | POA: Diagnosis not present

## 2022-05-07 DIAGNOSIS — N2581 Secondary hyperparathyroidism of renal origin: Secondary | ICD-10-CM | POA: Diagnosis not present

## 2022-05-07 DIAGNOSIS — E1122 Type 2 diabetes mellitus with diabetic chronic kidney disease: Secondary | ICD-10-CM | POA: Diagnosis not present

## 2022-05-07 DIAGNOSIS — Z992 Dependence on renal dialysis: Secondary | ICD-10-CM | POA: Diagnosis not present

## 2022-05-07 DIAGNOSIS — N186 End stage renal disease: Secondary | ICD-10-CM | POA: Diagnosis not present

## 2022-05-09 DIAGNOSIS — D631 Anemia in chronic kidney disease: Secondary | ICD-10-CM | POA: Diagnosis not present

## 2022-05-09 DIAGNOSIS — Z992 Dependence on renal dialysis: Secondary | ICD-10-CM | POA: Diagnosis not present

## 2022-05-09 DIAGNOSIS — N186 End stage renal disease: Secondary | ICD-10-CM | POA: Diagnosis not present

## 2022-05-09 DIAGNOSIS — E1122 Type 2 diabetes mellitus with diabetic chronic kidney disease: Secondary | ICD-10-CM | POA: Diagnosis not present

## 2022-05-09 DIAGNOSIS — N2581 Secondary hyperparathyroidism of renal origin: Secondary | ICD-10-CM | POA: Diagnosis not present

## 2022-05-12 DIAGNOSIS — E1122 Type 2 diabetes mellitus with diabetic chronic kidney disease: Secondary | ICD-10-CM | POA: Diagnosis not present

## 2022-05-12 DIAGNOSIS — D631 Anemia in chronic kidney disease: Secondary | ICD-10-CM | POA: Diagnosis not present

## 2022-05-12 DIAGNOSIS — N186 End stage renal disease: Secondary | ICD-10-CM | POA: Diagnosis not present

## 2022-05-12 DIAGNOSIS — N2581 Secondary hyperparathyroidism of renal origin: Secondary | ICD-10-CM | POA: Diagnosis not present

## 2022-05-12 DIAGNOSIS — Z992 Dependence on renal dialysis: Secondary | ICD-10-CM | POA: Diagnosis not present

## 2022-05-14 DIAGNOSIS — N186 End stage renal disease: Secondary | ICD-10-CM | POA: Diagnosis not present

## 2022-05-14 DIAGNOSIS — N2581 Secondary hyperparathyroidism of renal origin: Secondary | ICD-10-CM | POA: Diagnosis not present

## 2022-05-14 DIAGNOSIS — D631 Anemia in chronic kidney disease: Secondary | ICD-10-CM | POA: Diagnosis not present

## 2022-05-14 DIAGNOSIS — E1122 Type 2 diabetes mellitus with diabetic chronic kidney disease: Secondary | ICD-10-CM | POA: Diagnosis not present

## 2022-05-14 DIAGNOSIS — Z992 Dependence on renal dialysis: Secondary | ICD-10-CM | POA: Diagnosis not present

## 2022-05-16 DIAGNOSIS — D631 Anemia in chronic kidney disease: Secondary | ICD-10-CM | POA: Diagnosis not present

## 2022-05-16 DIAGNOSIS — N186 End stage renal disease: Secondary | ICD-10-CM | POA: Diagnosis not present

## 2022-05-16 DIAGNOSIS — N2581 Secondary hyperparathyroidism of renal origin: Secondary | ICD-10-CM | POA: Diagnosis not present

## 2022-05-16 DIAGNOSIS — Z992 Dependence on renal dialysis: Secondary | ICD-10-CM | POA: Diagnosis not present

## 2022-05-16 DIAGNOSIS — E1122 Type 2 diabetes mellitus with diabetic chronic kidney disease: Secondary | ICD-10-CM | POA: Diagnosis not present

## 2022-05-18 DIAGNOSIS — Z992 Dependence on renal dialysis: Secondary | ICD-10-CM | POA: Diagnosis not present

## 2022-05-18 DIAGNOSIS — I129 Hypertensive chronic kidney disease with stage 1 through stage 4 chronic kidney disease, or unspecified chronic kidney disease: Secondary | ICD-10-CM | POA: Diagnosis not present

## 2022-05-18 DIAGNOSIS — N186 End stage renal disease: Secondary | ICD-10-CM | POA: Diagnosis not present

## 2022-05-19 DIAGNOSIS — N2581 Secondary hyperparathyroidism of renal origin: Secondary | ICD-10-CM | POA: Diagnosis not present

## 2022-05-19 DIAGNOSIS — D631 Anemia in chronic kidney disease: Secondary | ICD-10-CM | POA: Diagnosis not present

## 2022-05-19 DIAGNOSIS — Z992 Dependence on renal dialysis: Secondary | ICD-10-CM | POA: Diagnosis not present

## 2022-05-19 DIAGNOSIS — E1122 Type 2 diabetes mellitus with diabetic chronic kidney disease: Secondary | ICD-10-CM | POA: Diagnosis not present

## 2022-05-19 DIAGNOSIS — N186 End stage renal disease: Secondary | ICD-10-CM | POA: Diagnosis not present

## 2022-05-21 DIAGNOSIS — E1122 Type 2 diabetes mellitus with diabetic chronic kidney disease: Secondary | ICD-10-CM | POA: Diagnosis not present

## 2022-05-21 DIAGNOSIS — Z992 Dependence on renal dialysis: Secondary | ICD-10-CM | POA: Diagnosis not present

## 2022-05-21 DIAGNOSIS — D631 Anemia in chronic kidney disease: Secondary | ICD-10-CM | POA: Diagnosis not present

## 2022-05-21 DIAGNOSIS — N186 End stage renal disease: Secondary | ICD-10-CM | POA: Diagnosis not present

## 2022-05-21 DIAGNOSIS — N2581 Secondary hyperparathyroidism of renal origin: Secondary | ICD-10-CM | POA: Diagnosis not present

## 2022-05-21 NOTE — Progress Notes (Signed)
Office Visit    Patient Name: Bradley Soto Date of Encounter: 05/22/2022  PCP:  Alysia PennaHolwerda, Scott, MD   [facility]  Cardiologist:  Kristeen MissPhilip Nahser, MD  Advanced Practice Provider:  No care team member to display Electrophysiologist:  None   HPI    Bradley GeraldJames Twyman is a 82 y.o. male with past medical history of coronary artery disease status post stenting and CABG x 4, sick sinus syndrome status post pacemaker, history of paroxysmal atrial fibrillation, CKD, diabetes mellitus, and gout presents today for follow-up appointment.  Previous history includes follow-up February 2019 he had been in the hospital for flu.  Echocardiogram while in the hospital showed normal LVEF of 65%, mild left ventricular hypertrophy, akinesis of the inferior basilar segment, mild aortic stenosis and grade 1 DD.  No SOB or chest pain.  He was seen in January 2020 and was doing well at that appointment.  He was having some palpitations.  No arrhythmias on EKG, atrial pacing.  He was in the ER October 8 but left due to the long wait time.  Had midsternal chest pain that seemed worse with movement.  No radiation.  Did not have nitroglycerin to take.  Chest pain occurred 3-4 times and woke him up in the middle of the night.  It lasts for several minutes.  Pain was relieved with nitroglycerin.  He was referred for cardiac catheterization which showed:  Ramus lesion of 99% stenosis, proximal LAD to mid LAD lesion of 70% stenosis, distal circumflex lesion 60% stenosis, mid RCA lesion of 95% stenosis, third marginal 1 lesion of 80% stenosis and third marginal 2 lesion of 99% stenosis, first LPL lesion of 99% stenosis.  Severe multivessel CAD.  Moderately severe mid LAD stenosis.  Severe restenosis of the stented segment in the proximal segment of the ramus intermedius branch.  Severe restenosis mid circumflex/OM stented segment.  Severe stenosis of the first OM branch beyond the stented segment.  Severe  stenosis distal circumflex with moderate caliber second OM branch.  Severe stenosis mid RCA.  Had bypass grafting LIMA to LAD, SVG to RI to OM 3, SVG to D1 December 05, 2018.  He was seen in follow-up several times in the all goes well.  October 08, 2021 he had some moderate carotid stenosis in the right ICA and mild stenosis in the left ICA.  Periodically plan to check carotid ultrasound.  He was taking midodrine for hypotensive support during dialysis.  He was having recurrent chest pain.  He also was requiring a transfusion at that time.  Cardiac PET/CT 11/2021 showed prior myocardial infarction, no evidence of postinfarct ischemia.  High risk study.  Severe coronary calcifications are present coronary calcifications were present in the left anterior descending artery, left circumflex artery, coronary artery distribution.  At the time of the study he was severely anemic and it was thought that dyspnea would improve with correction of anemia.  If symptoms do not improve, recommended cardiac catheterization.  Today, he tells me he is having some shortness of breath with walking and after he eats.  That is being worked up currently and has an upcoming CT scan.  He also states he is having some neuropathy symptoms that have started and he is on gabapentin but does not take it every day only as needed for pain.  His blood pressure drops sometimes during dialysis but he does not take any medications on this day.  He has tried midodrine in the past and  has not helped his blood pressure and only made labs worse.  He has not had any recent episodes or symptoms from his atrial fibrillation.  Reports no chest pain, pressure, or tightness. No edema, orthopnea, PND. Reports no palpitations.    Past Medical History    Past Medical History:  Diagnosis Date   Anxiety    Atrial fibrillation    Bell's palsy    left side of face droops   CAD (coronary artery disease)    s/p stent placement   CKD (chronic kidney  disease), stage III    [place]- [facility]- [facility]   Colon polyps    Diabetes mellitus without complication    Gout    History of blood transfusion    History of kidney stones    Passes   HLD (hyperlipidemia)    HTN (hypertension)    Insomnia    MI (myocardial infarction) 1998   Neuropathy    NSTEMI (non-ST elevated myocardial infarction)    s/p PCI in early 2000s   OSA on CPAP    wears CPAP   Presence of permanent cardiac pacemaker    SSS (sick sinus syndrome)    Type II or unspecified type diabetes mellitus without mention of complication, not stated as uncontrolled    Wears glasses    reading    Wears partial dentures    Past Surgical History:  Procedure Laterality Date   APPENDECTOMY     AV FISTULA PLACEMENT Right 07/28/2019   Procedure: RIGHT ARM Brachiocephalic ARTERIOVENOUS (AV) FISTULA CREATION;  Surgeon: Nada LibmanBrabham, Vance W, MD;  Location: [facility] OR;  Service: Vascular;  Laterality: Right;   CATARACT EXTRACTION     CLIPPING OF ATRIAL APPENDAGE  12/05/2018   Procedure: Clipping Of Atrial Appendage;  Surgeon: Linden DolinAtkins, Broadus Z, MD;  Location: [facility] OR;  Service: Open Heart Surgery;;   CORONARY ANGIOPLASTY WITH STENT PLACEMENT     x4   CORONARY ARTERY BYPASS GRAFT N/A 12/05/2018   Procedure: CORONARY ARTERY BYPASS GRAFTING (CABG) X FOUR USING LEFT INTERNAL MAMMARY ARTERY AND RIGHT SAPHENOUS VEIN GRAFTS;  Surgeon: Linden DolinAtkins, Broadus Z, MD;  Location: [facility] OR;  Service: Open Heart Surgery;  Laterality: N/A;   HERNIA REPAIR     ventral hernia repair with mesh--revision also was required   INSERT / REPLACE / REMOVE PACEMAKER  05/10/2000   pacemaker implanted in [place]]   IR THORACENTESIS ASP PLEURAL SPACE W/IMG GUIDE  12/21/2018   IR THORACENTESIS ASP PLEURAL SPACE W/IMG GUIDE  12/30/2018   LEFT HEART CATH AND CORONARY ANGIOGRAPHY N/A 11/30/2018   Procedure: LEFT HEART CATH AND CORONARY ANGIOGRAPHY;  Surgeon: Kathleene HazelMcAlhany, Christopher D, MD;  Location: [facility] INVASIVE CV LAB;  Service:  Cardiovascular;  Laterality: N/A;   MAZE N/A 12/05/2018   Procedure: MAZE;  Surgeon: Linden DolinAtkins, Broadus Z, MD;  Location: [facility] OR;  Service: Open Heart Surgery;  Laterality: N/A;   PACEMAKER GENERATOR CHANGE  09/03/09   MDT Adapta generator change in [place]   PARTIAL COLON REMOVAL  2/2   polyp that could not be removed on colonoscopy   PPM GENERATOR CHANGEOUT N/A 06/01/2019   Procedure: PPM GENERATOR CHANGEOUT;  Surgeon: Hillis RangeAllred, Isreal, MD;  Location: [facility] INVASIVE CV LAB;  Service: Cardiovascular;  Laterality: N/A;   REVISON OF ARTERIOVENOUS FISTULA Right 01/24/2020   Procedure: BANDING OF RIGHT ARM ARTERIOVENOUS FISTULA;  Surgeon: Nada LibmanBrabham, Vance W, MD;  Location: [facility] OR;  Service: Vascular;  Laterality: Right;   TEE WITHOUT CARDIOVERSION N/A 12/05/2018   Procedure: TRANSESOPHAGEAL ECHOCARDIOGRAM (  TEE);  Surgeon: Linden Dolin, MD;  [facility];  Service: Open Heart Surgery;  Laterality: N/A;    Allergies  Allergies  Allergen Reactions   Other Other (See Comments)   Shellfish Allergy Rash     EKGs/Labs/Other Studies Reviewed:   The following studies were reviewed today:  Cardiac PET/CT 11/2021 Findings are consistent with prior myocardial infarction. There is no evidence of post infarct ischemia. The study is high risk.   Myocardial blood flow was computed to be 0.45ml/g/min at rest and 0.57ml/g/min at stress. Global myocardial blood flow reserve was 0.98 and was highly abnormal.  Though sensitivity of MBRF is less reliable in the setting of CABG, stress flows less that rest flows can be seen in patients non responsive to Lexiscan or can be seen in highly abnormal perfusion.   Thare are small basal inferior and basal anterior perfusion defects in rest and stress with hypokinesis consistent with infarct.   Rest left ventricular function is abnormal. Rest global function is mildly reduced. Rest EF: 46 %. Stress left ventricular function is abnormal. Stress global function is mildly  reduced. Stress EF: 40 %. End diastolic cavity size is normal. End systolic cavity size is normal. LVEF Reserve -6% is a negative prognostic factor.   Coronary calcium was present on the attenuation correction CT images. Severe coronary calcifications were present. Coronary calcifications were present in the left anterior descending artery, left circumflex artery and right coronary artery distribution(s).   Electronically Signed    By: Riley Lam MD FASE    EKG:  EKG is not ordered today.   Recent Labs: 10/24/2021: ALT 35 04/17/2022: B Natriuretic Peptide 1,182.0; BUN 47; Creatinine, Ser 5.31; Hemoglobin 11.7; Platelets 121; Potassium 4.9; Sodium 137  Recent Lipid Panel    Component Value Date/Time   CHOL 85 12/02/2018 0333   TRIG 145 12/02/2018 0333   HDL 25 (L) 12/02/2018 0333   CHOLHDL 3.4 12/02/2018 0333   VLDL 29 12/02/2018 0333   LDLCALC 31 12/02/2018 0333   LDLDIRECT 50.5 10/10/2013 1017    Risk Assessment/Calculations:   CHA2DS2-VASc Score = 6   This indicates a 9.7% annual risk of stroke. The patient's score is based upon: CHF History: 1 HTN History: 1 Diabetes History: 1 Stroke History: 0 Vascular Disease History: 1 Age Score: 2 Gender Score: 0     Home Medications   Current Meds  Medication Sig   allopurinol (ZYLOPRIM) 100 MG tablet Take 1 tablet (100 mg total) by mouth daily.   aspirin EC 81 MG tablet Take 81 mg by mouth daily.   B Complex-C-Zn-Folic Acid (DIALYVITE 800 WITH ZINC) 0.8 MG TABS Take 1 tablet by mouth at bedtime.   CALCITRIOL PO Take 4-5 tablets by mouth See admin instructions. Patient takes 4-5 calcitrol vitamin d tablets (1.75mg  per patient's spouse) during dialysis (T, TH, Sat).   Copper 5 MG TABS Take 1 tablet (5 mg total) by mouth daily.   [employer] VITAMIN B12 1000 MCG tablet TAKE 1 TABLET BY MOUTH EVERY DAY   diltiazem (CARDIZEM SR) 60 MG 12 hr capsule Take 1 capsule (60 mg total) by mouth as needed (palpitations).   gabapentin  (NEURONTIN) 100 MG capsule Take 1 capsule (100 mg total) by mouth 3 (three) times daily.   iron sucrose in sodium chloride 0.9 % 100 mL once a week.   Methoxy PEG-Epoetin Beta (MIRCERA IJ) Every two weeks at dialysis   NOVOLOG MIX 70/30 FLEXPEN (70-30) 100 UNIT/ML FlexPen Inject 30 Units  into the skin daily. 30 unit Am and 30 units PM   rosuvastatin (CRESTOR) 5 MG tablet Take 5 mg by mouth every other day. Patient takes on Mondays, Wednesdays, Fridays and Sundays.   sevelamer carbonate (RENVELA) 800 MG tablet Take 1 tablet (800 mg total) by mouth 3 (three) times daily with meals.   [DISCONTINUED] metoprolol tartrate (LOPRESSOR) 25 MG tablet 1/2 tablet orally 4 days a week on opposite days of dialysis   [DISCONTINUED] nitroGLYCERIN (NITROSTAT) 0.4 MG SL tablet Place 1 tablet (0.4 mg total) under the tongue every 5 (five) minutes as needed for chest pain.     Review of Systems      All other systems reviewed and are otherwise negative except as noted above.  Physical Exam    VS:  BP (!) 140/74   Pulse 92   Ht 5\' 11"  (1.803 m)   Wt 215 lb 12.8 oz (97.9 kg)   SpO2 96%   BMI 30.10 kg/m  , BMI Body mass index is 30.1 kg/m.  Wt Readings from Last 3 Encounters:  05/22/22 215 lb 12.8 oz (97.9 kg)  04/17/22 215 lb (97.5 kg)  03/30/22 216 lb 14.9 oz (98.4 kg)     GEN: Well nourished, well developed, in no acute distress. HEENT: normal. Neck: Supple, no JVD, carotid bruits, or masses. Cardiac: RRR, + systolic murmur, rubs, or gallops. No clubbing, cyanosis, edema.  Radials/PT 2+ and equal bilaterally.  Respiratory:  Respirations regular and unlabored, clear to auscultation bilaterally. GI: Soft, nontender, nondistended. MS: No deformity or atrophy. Skin: Warm and dry, no rash. Neuro:  Strength and sensation are intact. Psych: Normal affect.  Assessment & Plan    Coronary artery disease -s/p CABG x 4 in 2020 with Dr. Renaldo Fiddler -Continue current medications to include aspirin 81 mg daily  nitro as needed, Crestor 5 mg Mondays, Wednesdays, Fridays, and Sundays  -No chest pain at this time  Chronic diastolic congestive HF -he is having DOE, so would recommend echo -no LE compression -continue current medications  ESRD on HD -sometimes BP drops low but midodrine has not helped in the past -he is not on any BP medications at this time  Hypertension -issues with hypotension during dialysis but it comes back up quickly -he has tried midodrine in the past and it made things worse for him  Paroxysmal atrial fibrillation -hardly ever has issues  -dilt 60mg  as needed added today -not on metoprolol anymore -not on a blood thinner due to chronic anemia  Carotid artery disease -once a year, Aug/Sep for updated [place] -mild disease when last checked  Disposition: Follow up 6 months with Kristeen Miss, MD or APP.  Signed, Sharlene Dory, PA-C 05/22/2022, 10:42 AM [facility]

## 2022-05-22 ENCOUNTER — Ambulatory Visit: Payer: Medicare Other | Attending: Physician Assistant | Admitting: Physician Assistant

## 2022-05-22 ENCOUNTER — Encounter: Payer: Self-pay | Admitting: Physician Assistant

## 2022-05-22 VITALS — BP 140/74 | HR 92 | Ht 71.0 in | Wt 215.8 lb

## 2022-05-22 DIAGNOSIS — I2510 Atherosclerotic heart disease of native coronary artery without angina pectoris: Secondary | ICD-10-CM | POA: Diagnosis not present

## 2022-05-22 DIAGNOSIS — I10 Essential (primary) hypertension: Secondary | ICD-10-CM

## 2022-05-22 DIAGNOSIS — I2489 Other forms of acute ischemic heart disease: Secondary | ICD-10-CM | POA: Diagnosis not present

## 2022-05-22 DIAGNOSIS — I6523 Occlusion and stenosis of bilateral carotid arteries: Secondary | ICD-10-CM

## 2022-05-22 DIAGNOSIS — I200 Unstable angina: Secondary | ICD-10-CM

## 2022-05-22 DIAGNOSIS — I5032 Chronic diastolic (congestive) heart failure: Secondary | ICD-10-CM | POA: Diagnosis not present

## 2022-05-22 DIAGNOSIS — R0609 Other forms of dyspnea: Secondary | ICD-10-CM

## 2022-05-22 DIAGNOSIS — E785 Hyperlipidemia, unspecified: Secondary | ICD-10-CM

## 2022-05-22 DIAGNOSIS — I480 Paroxysmal atrial fibrillation: Secondary | ICD-10-CM | POA: Diagnosis not present

## 2022-05-22 MED ORDER — DILTIAZEM HCL ER 60 MG PO CP12: 60.0000 mg | ORAL_CAPSULE | ORAL | 3 refills | Status: DC | PRN

## 2022-05-22 MED ORDER — NITROGLYCERIN 0.4 MG SL SUBL: 0.4000 mg | SUBLINGUAL_TABLET | SUBLINGUAL | 3 refills | Status: DC | PRN

## 2022-05-22 NOTE — Patient Instructions (Signed)
Medication Instructions:  Start Diltiazem 60 mg as needed for palpitations   *If you need a refill on your cardiac medications before your next appointment, please call your pharmacy*   Lab Work: None ordered   If you have labs (blood work) drawn today and your tests are completely normal, you will receive your results only by: MyChart Message (if you have MyChart) OR A paper copy in the mail If you have any lab test that is abnormal or we need to change your treatment, we will call you to review the results.   Testing/Procedures: Your physician has requested that you have an echocardiogram. Echocardiography is a painless test that uses sound waves to create images of your heart. It provides your doctor with information about the size and shape of your heart and how well your heart's chambers and valves are working. This procedure takes approximately one hour. There are no restrictions for this procedure. Please do NOT wear cologne, perfume, aftershave, or lotions (deodorant is allowed). Please arrive 15 minutes prior to your appointment time.    Follow-Up: At [facility], you and your health needs are our priority.  As part of our continuing mission to provide you with exceptional heart care, we have created designated Provider Care Teams.  These Care Teams include your primary Cardiologist (physician) and Advanced Practice Providers (APPs -  Physician Assistants and Nurse Practitioners) who all work together to provide you with the care you need, when you need it.  We recommend signing up for the patient portal called "MyChart".  Sign up information is provided on this After Visit Summary.  MyChart is used to connect with patients for Virtual Visits (Telemedicine).  Patients are able to view lab/test results, encounter notes, upcoming appointments, etc.  Non-urgent messages can be sent to your provider as well.   To learn more about what you can do with MyChart, go to  [email or web address].    Your next appointment:   6 month(s)  Provider:   Kristeen Miss, MD   or Jari Favre PA-C  Other Instructions

## 2022-05-23 DIAGNOSIS — D631 Anemia in chronic kidney disease: Secondary | ICD-10-CM | POA: Diagnosis not present

## 2022-05-23 DIAGNOSIS — N2581 Secondary hyperparathyroidism of renal origin: Secondary | ICD-10-CM | POA: Diagnosis not present

## 2022-05-23 DIAGNOSIS — Z992 Dependence on renal dialysis: Secondary | ICD-10-CM | POA: Diagnosis not present

## 2022-05-23 DIAGNOSIS — N186 End stage renal disease: Secondary | ICD-10-CM | POA: Diagnosis not present

## 2022-05-23 DIAGNOSIS — E1122 Type 2 diabetes mellitus with diabetic chronic kidney disease: Secondary | ICD-10-CM | POA: Diagnosis not present

## 2022-05-26 DIAGNOSIS — Z992 Dependence on renal dialysis: Secondary | ICD-10-CM | POA: Diagnosis not present

## 2022-05-26 DIAGNOSIS — D631 Anemia in chronic kidney disease: Secondary | ICD-10-CM | POA: Diagnosis not present

## 2022-05-26 DIAGNOSIS — N186 End stage renal disease: Secondary | ICD-10-CM | POA: Diagnosis not present

## 2022-05-26 DIAGNOSIS — N2581 Secondary hyperparathyroidism of renal origin: Secondary | ICD-10-CM | POA: Diagnosis not present

## 2022-05-26 DIAGNOSIS — E1122 Type 2 diabetes mellitus with diabetic chronic kidney disease: Secondary | ICD-10-CM | POA: Diagnosis not present

## 2022-05-28 DIAGNOSIS — N186 End stage renal disease: Secondary | ICD-10-CM | POA: Diagnosis not present

## 2022-05-28 DIAGNOSIS — D631 Anemia in chronic kidney disease: Secondary | ICD-10-CM | POA: Diagnosis not present

## 2022-05-28 DIAGNOSIS — N2581 Secondary hyperparathyroidism of renal origin: Secondary | ICD-10-CM | POA: Diagnosis not present

## 2022-05-28 DIAGNOSIS — E1122 Type 2 diabetes mellitus with diabetic chronic kidney disease: Secondary | ICD-10-CM | POA: Diagnosis not present

## 2022-05-28 DIAGNOSIS — Z992 Dependence on renal dialysis: Secondary | ICD-10-CM | POA: Diagnosis not present

## 2022-05-29 ENCOUNTER — Ambulatory Visit (INDEPENDENT_AMBULATORY_CARE_PROVIDER_SITE_OTHER): Payer: Medicare Other

## 2022-05-29 DIAGNOSIS — I495 Sick sinus syndrome: Secondary | ICD-10-CM | POA: Diagnosis not present

## 2022-05-29 LAB — CUP PACEART REMOTE DEVICE CHECK
Battery Remaining Longevity: 122 mo
Battery Voltage: 3.01 V
Brady Statistic AP VP Percent: 0.01 %
Brady Statistic AP VS Percent: 5.61 %
Brady Statistic AS VP Percent: 0.05 %
Brady Statistic AS VS Percent: 94.33 %
Brady Statistic RA Percent Paced: 5.84 %
Brady Statistic RV Percent Paced: 0.06 %
Date Time Interrogation Session: 20240411192218
Implantable Lead Connection Status: 753985
Implantable Lead Connection Status: 753985
Implantable Lead Implant Date: 20020325
Implantable Lead Implant Date: 20020325
Implantable Lead Location: 753859
Implantable Lead Location: 753860
Implantable Lead Model: 4092
Implantable Lead Model: 4524
Implantable Pulse Generator Implant Date: 20210415
Lead Channel Impedance Value: 266 Ohm
Lead Channel Impedance Value: 304 Ohm
Lead Channel Impedance Value: 456 Ohm
Lead Channel Impedance Value: 665 Ohm
Lead Channel Pacing Threshold Amplitude: 0.625 V
Lead Channel Pacing Threshold Amplitude: 2.5 V
Lead Channel Pacing Threshold Pulse Width: 0.4 ms
Lead Channel Pacing Threshold Pulse Width: 0.4 ms
Lead Channel Sensing Intrinsic Amplitude: 0.625 mV
Lead Channel Sensing Intrinsic Amplitude: 0.625 mV
Lead Channel Sensing Intrinsic Amplitude: 29.75 mV
Lead Channel Sensing Intrinsic Amplitude: 29.75 mV
Lead Channel Setting Pacing Amplitude: 2.5 V
Lead Channel Setting Pacing Amplitude: 3 V
Lead Channel Setting Pacing Pulse Width: 0.4 ms
Lead Channel Setting Sensing Sensitivity: 1.2 mV
Zone Setting Status: 755011
Zone Setting Status: 755011

## 2022-05-30 DIAGNOSIS — N2581 Secondary hyperparathyroidism of renal origin: Secondary | ICD-10-CM | POA: Diagnosis not present

## 2022-05-30 DIAGNOSIS — D631 Anemia in chronic kidney disease: Secondary | ICD-10-CM | POA: Diagnosis not present

## 2022-05-30 DIAGNOSIS — Z992 Dependence on renal dialysis: Secondary | ICD-10-CM | POA: Diagnosis not present

## 2022-05-30 DIAGNOSIS — N186 End stage renal disease: Secondary | ICD-10-CM | POA: Diagnosis not present

## 2022-05-30 DIAGNOSIS — E1122 Type 2 diabetes mellitus with diabetic chronic kidney disease: Secondary | ICD-10-CM | POA: Diagnosis not present

## 2022-06-02 DIAGNOSIS — Z992 Dependence on renal dialysis: Secondary | ICD-10-CM | POA: Diagnosis not present

## 2022-06-02 DIAGNOSIS — N186 End stage renal disease: Secondary | ICD-10-CM | POA: Diagnosis not present

## 2022-06-02 DIAGNOSIS — N2581 Secondary hyperparathyroidism of renal origin: Secondary | ICD-10-CM | POA: Diagnosis not present

## 2022-06-02 DIAGNOSIS — E1122 Type 2 diabetes mellitus with diabetic chronic kidney disease: Secondary | ICD-10-CM | POA: Diagnosis not present

## 2022-06-02 DIAGNOSIS — D631 Anemia in chronic kidney disease: Secondary | ICD-10-CM | POA: Diagnosis not present

## 2022-06-04 DIAGNOSIS — Z992 Dependence on renal dialysis: Secondary | ICD-10-CM | POA: Diagnosis not present

## 2022-06-04 DIAGNOSIS — N186 End stage renal disease: Secondary | ICD-10-CM | POA: Diagnosis not present

## 2022-06-04 DIAGNOSIS — E1122 Type 2 diabetes mellitus with diabetic chronic kidney disease: Secondary | ICD-10-CM | POA: Diagnosis not present

## 2022-06-04 DIAGNOSIS — D631 Anemia in chronic kidney disease: Secondary | ICD-10-CM | POA: Diagnosis not present

## 2022-06-04 DIAGNOSIS — N2581 Secondary hyperparathyroidism of renal origin: Secondary | ICD-10-CM | POA: Diagnosis not present

## 2022-06-05 ENCOUNTER — Ambulatory Visit
Admission: RE | Admit: 2022-06-05 | Discharge: 2022-06-05 | Disposition: A | Discharge status: Home / Self Care | Payer: Medicare Other | Source: Ambulatory Visit | Attending: Internal Medicine | Admitting: Internal Medicine

## 2022-06-05 DIAGNOSIS — R911 Solitary pulmonary nodule: Secondary | ICD-10-CM | POA: Diagnosis not present

## 2022-06-05 DIAGNOSIS — I700 Atherosclerosis of aorta: Secondary | ICD-10-CM | POA: Diagnosis not present

## 2022-06-06 DIAGNOSIS — N2581 Secondary hyperparathyroidism of renal origin: Secondary | ICD-10-CM | POA: Diagnosis not present

## 2022-06-06 DIAGNOSIS — D631 Anemia in chronic kidney disease: Secondary | ICD-10-CM | POA: Diagnosis not present

## 2022-06-06 DIAGNOSIS — E1122 Type 2 diabetes mellitus with diabetic chronic kidney disease: Secondary | ICD-10-CM | POA: Diagnosis not present

## 2022-06-06 DIAGNOSIS — Z992 Dependence on renal dialysis: Secondary | ICD-10-CM | POA: Diagnosis not present

## 2022-06-06 DIAGNOSIS — N186 End stage renal disease: Secondary | ICD-10-CM | POA: Diagnosis not present

## 2022-06-09 DIAGNOSIS — N2581 Secondary hyperparathyroidism of renal origin: Secondary | ICD-10-CM | POA: Diagnosis not present

## 2022-06-09 DIAGNOSIS — D631 Anemia in chronic kidney disease: Secondary | ICD-10-CM | POA: Diagnosis not present

## 2022-06-09 DIAGNOSIS — Z992 Dependence on renal dialysis: Secondary | ICD-10-CM | POA: Diagnosis not present

## 2022-06-09 DIAGNOSIS — N186 End stage renal disease: Secondary | ICD-10-CM | POA: Diagnosis not present

## 2022-06-09 DIAGNOSIS — E1122 Type 2 diabetes mellitus with diabetic chronic kidney disease: Secondary | ICD-10-CM | POA: Diagnosis not present

## 2022-06-11 DIAGNOSIS — N2581 Secondary hyperparathyroidism of renal origin: Secondary | ICD-10-CM | POA: Diagnosis not present

## 2022-06-11 DIAGNOSIS — N186 End stage renal disease: Secondary | ICD-10-CM | POA: Diagnosis not present

## 2022-06-11 DIAGNOSIS — Z992 Dependence on renal dialysis: Secondary | ICD-10-CM | POA: Diagnosis not present

## 2022-06-11 DIAGNOSIS — D631 Anemia in chronic kidney disease: Secondary | ICD-10-CM | POA: Diagnosis not present

## 2022-06-11 DIAGNOSIS — E1122 Type 2 diabetes mellitus with diabetic chronic kidney disease: Secondary | ICD-10-CM | POA: Diagnosis not present

## 2022-06-13 DIAGNOSIS — N186 End stage renal disease: Secondary | ICD-10-CM | POA: Diagnosis not present

## 2022-06-13 DIAGNOSIS — E1122 Type 2 diabetes mellitus with diabetic chronic kidney disease: Secondary | ICD-10-CM | POA: Diagnosis not present

## 2022-06-13 DIAGNOSIS — Z992 Dependence on renal dialysis: Secondary | ICD-10-CM | POA: Diagnosis not present

## 2022-06-13 DIAGNOSIS — D631 Anemia in chronic kidney disease: Secondary | ICD-10-CM | POA: Diagnosis not present

## 2022-06-13 DIAGNOSIS — N2581 Secondary hyperparathyroidism of renal origin: Secondary | ICD-10-CM | POA: Diagnosis not present

## 2022-06-16 DIAGNOSIS — N186 End stage renal disease: Secondary | ICD-10-CM | POA: Diagnosis not present

## 2022-06-16 DIAGNOSIS — E1122 Type 2 diabetes mellitus with diabetic chronic kidney disease: Secondary | ICD-10-CM | POA: Diagnosis not present

## 2022-06-16 DIAGNOSIS — N2581 Secondary hyperparathyroidism of renal origin: Secondary | ICD-10-CM | POA: Diagnosis not present

## 2022-06-16 DIAGNOSIS — D631 Anemia in chronic kidney disease: Secondary | ICD-10-CM | POA: Diagnosis not present

## 2022-06-16 DIAGNOSIS — Z992 Dependence on renal dialysis: Secondary | ICD-10-CM | POA: Diagnosis not present

## 2022-06-17 DIAGNOSIS — N186 End stage renal disease: Secondary | ICD-10-CM | POA: Diagnosis not present

## 2022-06-17 DIAGNOSIS — I129 Hypertensive chronic kidney disease with stage 1 through stage 4 chronic kidney disease, or unspecified chronic kidney disease: Secondary | ICD-10-CM | POA: Diagnosis not present

## 2022-06-17 DIAGNOSIS — Z992 Dependence on renal dialysis: Secondary | ICD-10-CM | POA: Diagnosis not present

## 2022-06-18 DIAGNOSIS — D631 Anemia in chronic kidney disease: Secondary | ICD-10-CM | POA: Diagnosis not present

## 2022-06-18 DIAGNOSIS — Z992 Dependence on renal dialysis: Secondary | ICD-10-CM | POA: Diagnosis not present

## 2022-06-18 DIAGNOSIS — N2581 Secondary hyperparathyroidism of renal origin: Secondary | ICD-10-CM | POA: Diagnosis not present

## 2022-06-18 DIAGNOSIS — E1122 Type 2 diabetes mellitus with diabetic chronic kidney disease: Secondary | ICD-10-CM | POA: Diagnosis not present

## 2022-06-18 DIAGNOSIS — N186 End stage renal disease: Secondary | ICD-10-CM | POA: Diagnosis not present

## 2022-06-20 DIAGNOSIS — Z992 Dependence on renal dialysis: Secondary | ICD-10-CM | POA: Diagnosis not present

## 2022-06-20 DIAGNOSIS — N2581 Secondary hyperparathyroidism of renal origin: Secondary | ICD-10-CM | POA: Diagnosis not present

## 2022-06-20 DIAGNOSIS — D631 Anemia in chronic kidney disease: Secondary | ICD-10-CM | POA: Diagnosis not present

## 2022-06-20 DIAGNOSIS — N186 End stage renal disease: Secondary | ICD-10-CM | POA: Diagnosis not present

## 2022-06-20 DIAGNOSIS — E1122 Type 2 diabetes mellitus with diabetic chronic kidney disease: Secondary | ICD-10-CM | POA: Diagnosis not present

## 2022-06-22 ENCOUNTER — Ambulatory Visit (HOSPITAL_COMMUNITY): Payer: Medicare Other | Attending: Physician Assistant

## 2022-06-22 DIAGNOSIS — R0609 Other forms of dyspnea: Secondary | ICD-10-CM | POA: Insufficient documentation

## 2022-06-22 LAB — ECHOCARDIOGRAM COMPLETE
AR max vel: 0.87 cm2
AV Area VTI: 0.87 cm2
AV Area mean vel: 0.8 cm2
AV Mean grad: 27 mmHg
AV Peak grad: 47.3 mmHg
Ao pk vel: 3.44 m/s
Area-P 1/2: 4.39 cm2
P 1/2 time: 589 msec
S' Lateral: 3.6 cm

## 2022-06-23 DIAGNOSIS — N2581 Secondary hyperparathyroidism of renal origin: Secondary | ICD-10-CM | POA: Diagnosis not present

## 2022-06-23 DIAGNOSIS — Z992 Dependence on renal dialysis: Secondary | ICD-10-CM | POA: Diagnosis not present

## 2022-06-23 DIAGNOSIS — E1122 Type 2 diabetes mellitus with diabetic chronic kidney disease: Secondary | ICD-10-CM | POA: Diagnosis not present

## 2022-06-23 DIAGNOSIS — N186 End stage renal disease: Secondary | ICD-10-CM | POA: Diagnosis not present

## 2022-06-23 DIAGNOSIS — D631 Anemia in chronic kidney disease: Secondary | ICD-10-CM | POA: Diagnosis not present

## 2022-06-25 ENCOUNTER — Telehealth: Payer: Self-pay | Admitting: Physician Assistant

## 2022-06-25 DIAGNOSIS — E1122 Type 2 diabetes mellitus with diabetic chronic kidney disease: Secondary | ICD-10-CM | POA: Diagnosis not present

## 2022-06-25 DIAGNOSIS — Z992 Dependence on renal dialysis: Secondary | ICD-10-CM | POA: Diagnosis not present

## 2022-06-25 DIAGNOSIS — N2581 Secondary hyperparathyroidism of renal origin: Secondary | ICD-10-CM | POA: Diagnosis not present

## 2022-06-25 DIAGNOSIS — D631 Anemia in chronic kidney disease: Secondary | ICD-10-CM | POA: Diagnosis not present

## 2022-06-25 DIAGNOSIS — N186 End stage renal disease: Secondary | ICD-10-CM | POA: Diagnosis not present

## 2022-06-25 NOTE — Telephone Encounter (Signed)
Will place side comment for triage nursing to reach out to the pt/wife this afternoon, as requested, to go over the pts recent echo results.

## 2022-06-25 NOTE — Telephone Encounter (Signed)
Patient's wife is calling requesting a callback this afternoon to go over the patient's echo results. Please advise.

## 2022-06-26 NOTE — Telephone Encounter (Signed)
Left message for the pt wife to call the clinic.

## 2022-06-27 DIAGNOSIS — Z992 Dependence on renal dialysis: Secondary | ICD-10-CM | POA: Diagnosis not present

## 2022-06-27 DIAGNOSIS — E1122 Type 2 diabetes mellitus with diabetic chronic kidney disease: Secondary | ICD-10-CM | POA: Diagnosis not present

## 2022-06-27 DIAGNOSIS — D631 Anemia in chronic kidney disease: Secondary | ICD-10-CM | POA: Diagnosis not present

## 2022-06-27 DIAGNOSIS — N2581 Secondary hyperparathyroidism of renal origin: Secondary | ICD-10-CM | POA: Diagnosis not present

## 2022-06-27 DIAGNOSIS — N186 End stage renal disease: Secondary | ICD-10-CM | POA: Diagnosis not present

## 2022-06-29 NOTE — Telephone Encounter (Signed)
Returned call and spoke with wife Huntley Dec on [place] who states that she understands the aortic stenosis is now "moderate to severe" but was wanting clarification on how close it is to being severe and anything they need to know. Informed of signs that's common with AS (fatigue, SOB, exertional dyspnea, exercise intolerance), but informed that severity is measured by velocity of flow and she would need to speak further with Dr Excell Seltzer on Wednesday about this. She appreciates call. Appt already scheduled for two days from now.

## 2022-06-30 DIAGNOSIS — D631 Anemia in chronic kidney disease: Secondary | ICD-10-CM | POA: Diagnosis not present

## 2022-06-30 DIAGNOSIS — N2581 Secondary hyperparathyroidism of renal origin: Secondary | ICD-10-CM | POA: Diagnosis not present

## 2022-06-30 DIAGNOSIS — N186 End stage renal disease: Secondary | ICD-10-CM | POA: Diagnosis not present

## 2022-06-30 DIAGNOSIS — E1122 Type 2 diabetes mellitus with diabetic chronic kidney disease: Secondary | ICD-10-CM | POA: Diagnosis not present

## 2022-06-30 DIAGNOSIS — Z992 Dependence on renal dialysis: Secondary | ICD-10-CM | POA: Diagnosis not present

## 2022-07-01 ENCOUNTER — Ambulatory Visit: Payer: Medicare Other | Attending: Cardiovascular Disease | Admitting: Cardiovascular Disease

## 2022-07-01 ENCOUNTER — Encounter: Payer: Self-pay | Admitting: Cardiovascular Disease

## 2022-07-01 VITALS — BP 114/60 | HR 81 | Ht 71.0 in | Wt 213.4 lb

## 2022-07-01 DIAGNOSIS — I350 Nonrheumatic aortic (valve) stenosis: Secondary | ICD-10-CM | POA: Insufficient documentation

## 2022-07-01 DIAGNOSIS — Z01810 Encounter for preprocedural cardiovascular examination: Secondary | ICD-10-CM | POA: Insufficient documentation

## 2022-07-01 NOTE — Progress Notes (Signed)
Cardiology Office Note:    Date:  07/01/2022   ID:  Daryel Gerald, DOB [birth year], MRN [id number]  PCP:  Alysia Penna, MD   [facility] Providers Cardiologist:  Kristeen Miss, MD     Referring MD: Alysia Penna, MD   Chief Complaint  Patient presents with   Shortness of Breath    History of Present Illness:    Lavarr Durnin is a 82 y.o. male presenting for evaluation of severe aortic stenosis, referred by Jari Favre, PA, and Dr. Elease Hashimoto.  The patient has a complex medical history.  He has undergone coronary stenting in the past.  In 2020 he began to develop progressive chest discomfort and ultimately underwent cardiac catheterization and was found to have severe multivessel coronary artery disease.  He was treated with multivessel CABG in 2020 with a LIMA to LAD graft, saphenous vein graft to ramus intermedius and OM 3, and saphenous vein graft to diagonal.  The distal right coronary artery branches were too small for bypass grafting.  The patient has gone on to develop progressive kidney disease and now end-stage renal disease.  He has reported progressive shortness of breath that his office visits with Dr. Elease Hashimoto.  Significant comorbidities have been felt to contribute including anemia of chronic disease.  He has not been anticoagulated for paroxysmal atrial fibrillation because of his chronic anemia and comorbid conditions.  He recently underwent an echocardiogram which demonstrated progressive aortic stenosis now in the moderate to severe range and he is referred for further evaluation.  The patient is here with his wife today.  They live in [place].  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  They have 5 children.  He has been on dialysis now for about 3 years and reports that he follows a Tuesday, Thursday, Saturday outpatient hemodialysis schedule.  He has chronic problems with hypotension during dialysis and reports that his dialysis has to be interrupted at  times to allow his blood pressure to increase.  He is increasingly fatigued on dialysis days.  On nondialysis days, he is functionally independent and still able to mow his lawn on a riding mower.  He cannot walk far because of shortness of breath and leg weakness.  He reports increasing exertional dyspnea over the past year.  He is limited to low-level activity now.  He only has chest pressure when he really "overdoes it."  He is also limited by neuropathy in his hands and feet.  He has some lightheadedness on dialysis days but no exertion related lightheadedness or near syncope.  He has had problems with chronic anemia but this has been significantly improved over recent months with treatment at the cancer center.  He reports that he had a copper deficiency.  Past Medical History:  Diagnosis Date   Anxiety    Atrial fibrillation (HCC)    Bell's palsy    left side of face droops   CAD (coronary artery disease)    s/p stent placement   CKD (chronic kidney disease), stage III (HCC)    [place]- [facility]- [facility]   Colon polyps    Diabetes mellitus without complication (HCC)    Gout    History of blood transfusion    History of kidney stones    Passes   HLD (hyperlipidemia)    HTN (hypertension)    Insomnia    MI (myocardial infarction) (HCC) 1998   Neuropathy    NSTEMI (non-ST elevated myocardial infarction) (HCC)    s/p PCI in  early 2000s   OSA on CPAP    wears CPAP   Presence of permanent cardiac pacemaker    SSS (sick sinus syndrome) (HCC)    Type II or unspecified type diabetes mellitus without mention of complication, not stated as uncontrolled    Wears glasses    reading    Wears partial dentures     Past Surgical History:  Procedure Laterality Date   APPENDECTOMY     AV FISTULA PLACEMENT Right 07/28/2019   Procedure: RIGHT ARM Brachiocephalic ARTERIOVENOUS (AV) FISTULA CREATION;  Surgeon: Nada Libman, MD;  Location: [facility] OR;  Service: Vascular;  Laterality: Right;    CATARACT EXTRACTION     CLIPPING OF ATRIAL APPENDAGE  12/05/2018   Procedure: Clipping Of Atrial Appendage;  Surgeon: Linden Dolin, MD;  Location: [facility] OR;  Service: Open Heart Surgery;;   CORONARY ANGIOPLASTY WITH STENT PLACEMENT     x4   CORONARY ARTERY BYPASS GRAFT N/A 12/05/2018   Procedure: CORONARY ARTERY BYPASS GRAFTING (CABG) X FOUR USING LEFT INTERNAL MAMMARY ARTERY AND RIGHT SAPHENOUS VEIN GRAFTS;  Surgeon: Linden Dolin, MD;  Location: [facility] OR;  Service: Open Heart Surgery;  Laterality: N/A;   HERNIA REPAIR     ventral hernia repair with mesh--revision also was required   INSERT / REPLACE / REMOVE PACEMAKER  05/10/2000   pacemaker implanted in [place]]   IR THORACENTESIS ASP PLEURAL SPACE W/IMG GUIDE  12/21/2018   IR THORACENTESIS ASP PLEURAL SPACE W/IMG GUIDE  12/30/2018   LEFT HEART CATH AND CORONARY ANGIOGRAPHY N/A 11/30/2018   Procedure: LEFT HEART CATH AND CORONARY ANGIOGRAPHY;  Surgeon: Kathleene Hazel, MD;  Location: [facility] INVASIVE CV LAB;  Service: Cardiovascular;  Laterality: N/A;   MAZE N/A 12/05/2018   Procedure: MAZE;  Surgeon: Linden Dolin, MD;  Location: [facility] OR;  Service: Open Heart Surgery;  Laterality: N/A;   PACEMAKER GENERATOR CHANGE  09/03/09   MDT Adapta generator change in [place]   PARTIAL COLON REMOVAL  2/2   polyp that could not be removed on colonoscopy   PPM GENERATOR CHANGEOUT N/A 06/01/2019   Procedure: PPM GENERATOR CHANGEOUT;  Surgeon: Hillis Range, MD;  [facility]: [facility] INV[facility];  Service: Cardiovascular;  Laterality: N/A;   REVISON OF ARTERIOVENOUS FISTULA Right 01/24/2020   Procedure: BANDING OF RIGHT ARM ARTERIOVENOUS FISTULA;  Surgeon: Nada Libman, MD;  Location: [facility] OR;  Service: Vascular;  Laterality: Right;   TEE WITHOUT CARDIOVERSION N/A 12/05/2018   Procedure: TRANSESOPHAGEAL ECHOCARDIOGRAM (TEE);  Surgeon: Linden Dolin, MD;  Location: [facility] OR;  Service: Open Heart Surgery;  Laterality: N/A;    Current  Medications: Current Meds  Medication Sig   allopurinol (ZYLOPRIM) 100 MG tablet Take 1 tablet (100 mg total) by mouth daily.   aspirin EC 81 MG tablet Take 81 mg by mouth daily.   B Complex-C-Zn-Folic Acid (DIALYVITE 800 WITH ZINC) 0.8 MG TABS Take 1 tablet by mouth at bedtime.   CALCITRIOL PO Take 4-5 tablets by mouth See admin instructions. Patient takes 4-5 calcitrol vitamin d tablets (1.75mg  per patient's spouse) during dialysis (T, TH, Sat).   Copper 5 MG TABS Take 1 tablet (5 mg total) by mouth daily.   [employer]S VITAMIN B12 1000 MCG tablet TAKE 1 TABLET BY MOUTH EVERY DAY   diltiazem (CARDIZEM SR) 60 MG 12 hr capsule Take 1 capsule (60 mg total) by mouth as needed (palpitations).   gabapentin (NEURONTIN) 100 MG capsule Take 1 capsule (100 mg total) by  mouth 3 (three) times daily.   iron sucrose in sodium chloride 0.9 % 100 mL once a week.   Methoxy PEG-Epoetin Beta (MIRCERA IJ) Mircera   Methoxy PEG-Epoetin Beta (MIRCERA IJ) Mircera   midodrine (PROAMATINE) 5 MG tablet Take by mouth. Per patient taking Tuesdays, Thursdays and Saturdays before dialysis   nitroGLYCERIN (NITROSTAT) 0.4 MG SL tablet Place 1 tablet (0.4 mg total) under the tongue every 5 (five) minutes as needed for chest pain.   NOVOLOG MIX 70/30 FLEXPEN (70-30) 100 UNIT/ML FlexPen Inject 30 Units into the skin daily. 30 unit Am and 30 units PM   sevelamer carbonate (RENVELA) 800 MG tablet Take 1 tablet (800 mg total) by mouth 3 (three) times daily with meals.   [DISCONTINUED] rosuvastatin (CRESTOR) 5 MG tablet Take 5 mg by mouth every other day. Patient takes on Mondays, Wednesdays, Fridays and Sundays.     Allergies:   Other and Shellfish allergy   Social History   Socioeconomic History   Marital status: Married    Spouse name: Not on file   Number of children: Not on file   Years of education: Not on file   Highest education level: Not on file  Occupational History   Not on file  Tobacco Use   Smoking status:  Former    Types: Cigarettes   Smokeless tobacco: Never   Tobacco comments:    " many years ago"  Vaping Use   Vaping Use: Never used  Substance and Sexual Activity   Alcohol use: No   Drug use: No   Sexual activity: Not on file  Other Topics Concern   Not on file  Social History Narrative   Lives with wife   Caffeine coffee 2 daily   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   Social Determinants of Health   Financial Resource Strain: Not on file  Food Insecurity: Not on file  Transportation Needs: Not on file  Physical Activity: Not on file  Stress: Not on file  Social Connections: Not on file     Family History: The patient's family history includes Breast cancer in his sister; Deep vein thrombosis in his sister; Heart attack (age of onset: 25) in his father; Heart disease in his father; Pulmonary embolism in his sister.  ROS:   Please see the history of present illness.    All other systems reviewed and are negative.  EKGs/Labs/Other Studies Reviewed:    The following studies were reviewed today: Cardiac Studies & Procedures   CARDIAC CATHETERIZATION  CARDIAC CATHETERIZATION 11/30/2018  Narrative  Ramus lesion is 99% stenosed.  Prox LAD to Mid LAD lesion is 70% stenosed.  Dist Cx lesion is 60% stenosed.  Mid RCA lesion is 95% stenosed.  3rd Mrg-1 lesion is 80% stenosed.  3rd Mrg-2 lesion is 99% stenosed.  1st LPL lesion is 99% stenosed.  1. Severe multi-vessel CAD 2. Moderately severe mid LAD stenosis 3. Severe restenosis of the stented segment in the proximal segment of the ramus intermediate branch 4. Severe restenosis mid Circumflex/obtuse marginal stented segment. Severe stenosis in the first obtuse marginal branch beyond the stented segment. Severe stenosis distal Circumflex leading into the moderate caliber second obtuse marginal branch. 5. Severe stenosis mid RCA  Recommendations: Pt is a diabetic with mult-vessel CAD. Will ask CT surgery to  see him to discuss potential bypass surgery. If he is not felt to be a candidate for bypass, would consider multi-vessel PCI. Admit to telemetry. Will hydrate tonight. BMET in  am.  Findings Coronary Findings Diagnostic  Dominance: Co-dominant  Left Anterior Descending Prox LAD to Mid LAD lesion is 70% stenosed.  Ramus Intermedius Ramus lesion is 99% stenosed. The lesion was previously treated.  Left Circumflex Vessel is large. Dist Cx lesion is 60% stenosed. The lesion was previously treated.  Third Obtuse Marginal Branch 3rd Mrg-1 lesion is 80% stenosed. The lesion was previously treated. 3rd Mrg-2 lesion is 99% stenosed.  First Left Posterolateral Branch 1st LPL lesion is 99% stenosed.  Right Coronary Artery Vessel is moderate in size. Mid RCA lesion is 95% stenosed.  Intervention  No interventions have been documented.   STRESS TESTS  NM PET CT CARDIAC PERFUSION MULTI W/ABSOLUTE BLOODFLOW 11/18/2021  Narrative   Findings are consistent with prior myocardial infarction. There is no evidence of post infarct ischemia. The study is high risk.   Myocardial blood flow was computed to be 0.59ml/g/min at rest and 0.41ml/g/min at stress. Global myocardial blood flow reserve was 0.98 and was highly abnormal.  Though sensitivity of MBRF is less reliable in the setting of CABG, stress flows less that rest flows can be seen in patients non responsive to Lexiscan or can be seen in highly abnormal perfusion.   Thare are small basal inferior and basal anterior perfusion defects in rest and stress with hypokinesis consistent with infarct.   Rest left ventricular function is abnormal. Rest global function is mildly reduced. Rest EF: 46 %. Stress left ventricular function is abnormal. Stress global function is mildly reduced. Stress EF: 40 %. End diastolic cavity size is normal. End systolic cavity size is normal. LVEF Reserve -6% is a negative prognostic factor.   Coronary calcium was present  on the attenuation correction CT images. Severe coronary calcifications were present. Coronary calcifications were present in the left anterior descending artery, left circumflex artery and right coronary artery distribution(s).   Electronically Signed By: Riley Lam MD FASE  CLINICAL DATA:  This over-read does not include interpretation of cardiac or coronary anatomy or pathology. The Cardiac PET CT interpretation by the cardiologist is attached.  COMPARISON:  Chest CT December 01, 2018 and chest radiograph October 24, 2021.  FINDINGS: Vascular: Aortic atherosclerosis. Prior median sternotomy and CABG. Partially visualized intracardiac leads.  Mediastinum/Nodes: Within the visualized portions of the chest there are no pathologically enlarged mediastinal or hilar lymph nodes within the limitation of noncontrast enhanced examination. The mid/distal esophagus is grossly unremarkable.  Lungs/Pleura: Scattered pulmonary nodules for instance in the right lower lobe measuring 8 mm on image 5/4 and in the right middle lobe measuring 6 mm on image 45/4. Chronic left pleural thickening with a rounded consolidative focus in the lingula adjacent to the fissure measuring 19 mm on image 39/4 corresponding with the region of atelectasis seen on prior examination likely reflecting round atelectasis. Dendritic type calcifications in the left lung base.  Upper Abdomen: No acute abnormality.  Musculoskeletal: Bilateral gynecomastia. Multilevel degenerative changes spine.  IMPRESSION: 1. Scattered pulmonary nodules measuring up to 8 mm in the right lower lobe. Suggest follow-up dedicated chest CT in 3-6 months. 2. Chronic left pleural thickening with a rounded consolidative focus in the lingula adjacent to the fissure measuring 19 mm corresponding with the region of atelectasis seen on prior examination likely reflecting round atelectasis. Suggest attention on follow-up chest CT 3.   Aortic Atherosclerosis (ICD10-I70.0).   Electronically Signed By: Maudry Mayhew M.D. On: 11/18/2021 17:45   ECHOCARDIOGRAM  ECHOCARDIOGRAM COMPLETE 06/22/2022  Narrative ECHOCARDIOGRAM REPORT  Patient Name:   FOUNT FUGERE Date of Exam: 06/22/2022 Medical Rec #:  [id number]      Height:       71.0 in Accession #:    [id number]     Weight:       215.8 lb Date of Birth:  Dec 30, 1940       BSA:          2.178 m Patient Age:    81 years       BP:           131/72 mmHg Patient Gender: M              HR:           87 bpm. Exam Location:  [address]  Procedure: 2D Echo, Cardiac Doppler, Color Doppler and Strain Analysis  Indications:    R06.00 SOB  History:        Patient has prior history of Echocardiogram examinations, most recent 12/01/2018. Previous Myocardial Infarction, Prior CABG and Pacemaker, CKD, Arrythmias:Atrial Fibrillation; Risk Factors:Sleep Apnea and Diabetes.  Sonographer:    Clearence Ped RCS Referring Phys: [id number] TESSA N CONTE  IMPRESSIONS   1. Left ventricular ejection fraction, by estimation, is 55 to 60%. The left ventricle has normal function. The left ventricle has no regional wall motion abnormalities. There is mild asymmetric left ventricular hypertrophy of the septal segment. Left ventricular diastolic parameters are consistent with Grade II diastolic dysfunction (pseudonormalization). Elevated left ventricular end-diastolic pressure. The average left ventricular global longitudinal strain is -15.3 %. The global longitudinal strain is abnormal. 2. Right ventricular systolic function is normal. The right ventricular size is mildly enlarged. There is normal pulmonary artery systolic pressure. 3. The mitral valve is normal in structure. Trivial mitral valve regurgitation. No evidence of mitral stenosis. 4. The aortic valve is calcified. Aortic valve regurgitation is trivial. Moderate to severe aortic valve stenosis. Aortic regurgitation PHT measures 589  msec. Aortic valve area, by VTI measures 0.87 cm. Aortic valve mean gradient measures 27.0 mmHg. Aortic valve Vmax measures 3.44 m/s. 5. There is mild dilatation of the ascending aorta, measuring 38 mm. 6. The inferior vena cava is normal in size with greater than 50% respiratory variability, suggesting right atrial pressure of 3 mmHg.  FINDINGS Left Ventricle: Left ventricular ejection fraction, by estimation, is 55 to 60%. The left ventricle has normal function. The left ventricle has no regional wall motion abnormalities. The average left ventricular global longitudinal strain is -15.3 %. The global longitudinal strain is abnormal. The left ventricular internal cavity size was normal in size. There is mild asymmetric left ventricular hypertrophy of the septal segment. Left ventricular diastolic parameters are consistent with Grade II diastolic dysfunction (pseudonormalization). Elevated left ventricular end-diastolic pressure.  Right Ventricle: The right ventricular size is mildly enlarged. No increase in right ventricular wall thickness. Right ventricular systolic function is normal. There is normal pulmonary artery systolic pressure. The tricuspid regurgitant velocity is 1.65 m/s, and with an assumed right atrial pressure of 3 mmHg, the estimated right ventricular systolic pressure is 13.9 mmHg.  Left Atrium: Left atrial size was normal in size.  Right Atrium: Right atrial size was normal in size.  Pericardium: There is no evidence of pericardial effusion. Presence of epicardial fat layer.  Mitral Valve: The mitral valve is normal in structure. Trivial mitral valve regurgitation. No evidence of mitral valve stenosis.  Tricuspid Valve: The tricuspid valve is normal in structure. Tricuspid valve regurgitation is not demonstrated. No evidence of  tricuspid stenosis.  Aortic Valve: The aortic valve is calcified. Aortic valve regurgitation is trivial. Aortic regurgitation PHT measures 589  msec. Moderate to severe aortic stenosis is present. Aortic valve mean gradient measures 27.0 mmHg. Aortic valve peak gradient measures 47.3 mmHg. Aortic valve area, by VTI measures 0.87 cm.  Pulmonic Valve: The pulmonic valve was normal in structure. Pulmonic valve regurgitation is not visualized. No evidence of pulmonic stenosis.  Aorta: There is mild dilatation of the ascending aorta, measuring 38 mm.  Venous: The inferior vena cava is normal in size with greater than 50% respiratory variability, suggesting right atrial pressure of 3 mmHg.  IAS/Shunts: No atrial level shunt detected by color flow Doppler.   LEFT VENTRICLE PLAX 2D LVIDd:         5.00 cm   Diastology LVIDs:         3.60 cm   LV e' medial:    5.22 cm/s LV PW:         0.80 cm   LV E/e' medial:  20.7 LV IVS:        1.30 cm   LV e' lateral:   6.64 cm/s LVOT diam:     2.10 cm   LV E/e' lateral: 16.3 LV SV:         66 LV SV Index:   30        2D Longitudinal Strain LVOT Area:     3.46 cm  2D Strain GLS (A2C):   -13.5 % 2D Strain GLS (A3C):   -16.9 % 2D Strain GLS (A4C):   -15.4 % 2D Strain GLS Avg:     -15.3 %  RIGHT VENTRICLE RV Basal diam:  4.20 cm RV Mid diam:    2.90 cm RV S prime:     9.79 cm/s TAPSE (M-mode): 1.9 cm RVSP:           13.9 mmHg  LEFT ATRIUM             Index        RIGHT ATRIUM           Index LA diam:        4.30 cm 1.97 cm/m   RA Pressure: 3.00 mmHg LA Vol (A2C):   76.2 ml 34.99 ml/m  RA Area:     15.70 cm LA Vol (A4C):   63.7 ml 29.25 ml/m  RA Volume:   38.70 ml  17.77 ml/m LA Biplane Vol: 70.3 ml 32.28 ml/m AORTIC VALVE AV Area (Vmax):    0.87 cm AV Area (Vmean):   0.80 cm AV Area (VTI):     0.87 cm AV Vmax:           344.00 cm/s AV Vmean:          246.000 cm/s AV VTI:            0.761 m AV Peak Grad:      47.3 mmHg AV Mean Grad:      27.0 mmHg LVOT Vmax:         86.20 cm/s LVOT Vmean:        56.800 cm/s LVOT VTI:          0.191 m LVOT/AV VTI ratio: 0.25 AI PHT:             589 msec  AORTA Ao Root diam: 3.40 cm Ao Asc diam:  3.80 cm  MITRAL VALVE  TRICUSPID VALVE MV Area (PHT):              TR Peak grad:   10.9 mmHg MV Decel Time:              TR Vmax:        165.00 cm/s MV E velocity: 108.00 cm/s  Estimated RAP:  3.00 mmHg MV A velocity: 108.00 cm/s  RVSP:           13.9 mmHg MV E/A ratio:  1.00 SHUNTS Systemic VTI:  0.19 m Systemic Diam: 2.10 cm  Kardie Tobb DO Electronically signed by Thomasene Ripple DO Signature Date/Time: 06/22/2022/3:40:21 PM    Final   TEE  ECHO INTRAOPERATIVE TEE 12/05/2018  Narrative *INTRAOPERATIVE TRANSESOPHAGEAL REPORT *    Patient Name:   GURVIS SASO Date of Exam: 12/05/2018 Medical Rec #:  [id number]      Height:       71.5 in Accession #:    [id number]     Weight:       235.9 lb Date of Birth:  01-May-1940       BSA:          2.27 m Patient Age:    78 years       BP:           131/64 mmHg Patient Gender: M              HR:           65 bpm. Exam Location:  Anesthesiology  Transesophogeal exam was perform intraoperatively during surgical procedure. Patient was closely monitored under general anesthesia during the entirety of examination.  Indications:     I25.700 Atherosclerosis of coronary artery bypass graft(s), unspecified, with unstable angina pectoris History:         CAD and Previous Myocardial Infarction, Abnormal ECG and Pacemaker, Aortic Valve Disease; Arrythmias:Atrial Fibrillation Signs/Symptoms:Chest Pain Risk Factors:Hypertension, Diabetes, Sleep Apnea and Dyslipidemia. Performing Phys: [id number] Merri Brunette ATKINS Diagnosing Phys: Leslye Peer MD  Complications: No known complications during this procedure. POST-OP IMPRESSIONS - Left Ventricle: The left ventricle is unchanged from pre-bypass. - Right Ventricle: The right ventricle appears unchanged from pre-bypass. - Aorta: The aorta appears unchanged from pre-bypass. - Left Atrium: The left atrium appears unchanged from  pre-bypass. - Left Atrial Appendage: The left atrial appendage appears unchanged from pre-bypass. - Aortic Valve: The aortic valve appears unchanged from pre-bypass. - Mitral Valve: The mitral valve appears unchanged from pre-bypass. - Tricuspid Valve: The tricuspid valve appears unchanged from pre-bypass. - Interatrial Septum: The interatrial septum appears unchanged from pre-bypass. - Interventricular Septum: The interventricular septum appears unchanged from pre-bypass. - Pericardium: The pericardium appears unchanged from pre-bypass.  PRE-OP FINDINGS Left Ventricle: The left ventricle has normal systolic function, with an ejection fraction of 55-60%. The cavity size was normal. There is mild upper septal hypertrophy.  Right Ventricle: The right ventricle has normal systolic function. The cavity was normal. There is no increase in right ventricular wall thickness.  Left Atrium: Left atrial size was dilated. There is continuous echo contrast seen in the left atrial cavity. The left atrial appendage is well visualized and there is no evidence of thrombus present.  Right Atrium: Right atrial size was normal in size. Right atrial pressure is estimated at 10 mmHg.  Interatrial Septum: The interatrial septum was not well visualized.  Pericardium: There is no evidence of pericardial effusion.  Mitral Valve: The mitral valve is normal in structure. Mitral valve regurgitation is  not visualized by color flow Doppler.  Tricuspid Valve: The tricuspid valve was not well visualized. Tricuspid valve regurgitation was not visualized by color flow Doppler.  Aortic Valve: The aortic valve is tricuspid Aortic valve regurgitation was not visualized by color flow Doppler. There is no evidence of aortic valve stenosis.  Pulmonic Valve: The pulmonic valve was not well visualized. Pulmonic valve regurgitation was not assessed by color flow Doppler.   Aorta: The aortic root, ascending aorta and aortic  arch are normal in size and structure. There is evidence of immobile plaque in the descending aorta.   Leslye Peer MD Electronically signed by Leslye Peer MD Signature Date/Time: 12/05/2018/12:44:24 PM    Final             EKG:  EKG is ordered today.  The ekg ordered today demonstrates normal sinus rhythm 81 bpm, right bundle branch block, age-indeterminate inferior infarct.  Recent Labs: 10/24/2021: ALT 35 04/17/2022: B Natriuretic Peptide 1,182.0; BUN 47; Creatinine, Ser 5.31; Hemoglobin 11.7; Platelets 121; Potassium 4.9; Sodium 137  Recent Lipid Panel    Component Value Date/Time   CHOL 85 12/02/2018 0333   TRIG 145 12/02/2018 0333   HDL 25 (L) 12/02/2018 0333   CHOLHDL 3.4 12/02/2018 0333   VLDL 29 12/02/2018 0333   LDLCALC 31 12/02/2018 0333   LDLDIRECT 50.5 10/10/2013 1017     Risk Assessment/Calculations:    CHA2DS2-VASc Score = 6   This indicates a 9.7% annual risk of stroke. The patient's score is based upon: CHF History: 1 HTN History: 1 Diabetes History: 1 Stroke History: 0 Vascular Disease History: 1 Age Score: 2 Gender Score: 0               Physical Exam:    VS:  BP 114/60   Pulse 81   Ht 5\' 11"  (1.803 m)   Wt 213 lb 6.4 oz (96.8 kg)   SpO2 96%   BMI 29.76 kg/m     Wt Readings from Last 3 Encounters:  07/01/22 213 lb 6.4 oz (96.8 kg)  05/22/22 215 lb 12.8 oz (97.9 kg)  04/17/22 215 lb (97.5 kg)     GEN:  Well nourished, well developed in no acute distress HEENT: Normal NECK: No JVD; No carotid bruits LYMPHATICS: No lymphadenopathy CARDIAC: Irregularly irregular with a 3/6 harsh crescendo decrescendo murmur loudest at the left lower sternal border, diminished A2 RESPIRATORY:  Clear to auscultation without rales, wheezing or rhonchi  ABDOMEN: Soft, non-tender, non-distended MUSCULOSKELETAL:  No edema; No deformity  SKIN: Warm and dry NEUROLOGIC:  Alert and oriented x 3 PSYCHIATRIC:  Normal affect   ASSESSMENT:    1.  Nonrheumatic aortic (valve) stenosis   2. 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  This is associated with NYHA functional class III symptoms of exertional dyspnea and fatigue.  His echo cardiogram is personally reviewed and demonstrates severe calcification and leaflet restriction of the aortic valve.  LVEF is 55 to 60%.  RV function is normal.  There is no pericardial effusion.  Stroke-volume index is low at 30.  Peak transaortic velocity is 3.4 m/s with a peak gradient of 47 mmHg and a mean gradient of 27 mmHg.  Dimensionless index is 0.25 and calculated aortic valve area is 0.8 cm.  The patient had an echocardiogram done before the cardiac surgery back in 2020.  It is interesting at that time he had no aortic stenosis.  I personally reviewed these images and the aortic valve well poorly visualized, appeared normal with no increased thickening or calcification in the Doppler evaluation was also normal.  He has developed severe aortic stenosis in a short period of time, likely related to end-stage renal disease on dialysis with rapid calcification of the valve.  Comorbidities of importance include the presence of prior CABG, paroxysmal A-fib not on anticoagulation, end-stage renal disease, chronic anemia, peripheral neuropathy, and type 2 diabetes requiring insulin administration.  Today we discussed the natural history of severe, symptomatic aortic stenosis.  We contrasted treatment options which include palliative medical therapy, TAVR, or surgical aortic valve replacement.  I do not think the patient would be a candidate for conventional surgery at 82 years old with prior sternotomy and ESRD.  He would like to seek treatment and is not ready for a palliative approach to his care.  He understands that there are concerns about TAVR valve longevity in  hemodialysis patients, but I agree that aortic valve replacement is indicated to treat the patient's severe aortic stenosis.  I reviewed a procedural animation of the TAVR surgery with the patient and his wife today.  We discussed potential risks, indications, and alternatives.  He understands the need for right and left heart catheterization to assess for graft patency and hemodynamics prior to TAVR. I have reviewed the risks, indications, and alternatives to cardiac catheterization, possible angioplasty, and stenting with the patient. Risks include but are not limited to bleeding, infection, vascular injury, stroke, myocardial infection, arrhythmia, kidney injury, radiation-related injury in the case of prolonged fluoroscopy use, emergency cardiac surgery, and death. The patient understands the risks of serious complication is 1-2 in 1000 with diagnostic cardiac cath and 1-2% or less with angioplasty/stenting.  He will also need to undergo a gated cardiac CTA and a CTA of the chest, abdomen, and pelvis to evaluate for anatomic suitability for transfemoral TAVR.  Once his preoperative studies are completed, he will be referred to cardiac surgery for formal evaluation as part of a multidisciplinary approach to his care.      Shared Decision Making/Informed Consent The risks [stroke (1 in 1000), death (1 in 1000), kidney failure [usually temporary] (1 in 500), bleeding (1 in 200), allergic reaction [possibly serious] (1 in 200)], benefits (diagnostic support and management of coronary artery disease) and alternatives of a cardiac catheterization were discussed in detail with Mr. Avetisyan and he is willing to proceed.    Medication Adjustments/Labs and Tests Ordered: Current medicines are reviewed at length with the patient today.  Concerns regarding medicines are outlined above.  Orders Placed This Encounter  Procedures   Basic metabolic panel   CBC   EKG 12-Lead   No orders of the defined types were  placed in this encounter.   Patient Instructions  Medication Instructions:  Your physician recommends that you continue on your current medications as directed. Please refer to the Current Medication list given to you today.  *If you need a refill on your cardiac medications before your next appointment, please call your pharmacy*   Lab Work: BMET (within 30 days of procedure) If you have labs (blood work) drawn today and your tests are completely normal, you will receive your results only by: MyChart Message (if you have MyChart) OR A paper copy in the mail If you have any lab test that is abnormal or we need to change your treatment, we will  call you to review the results.   Testing/Procedures: R & L heart catheterization Your physician has requested that you have a cardiac catheterization. Cardiac catheterization is used to diagnose and/or treat various heart conditions. Doctors may recommend this procedure for a number of different reasons. The most common reason is to evaluate chest pain. Chest pain can be a symptom of coronary artery disease (CAD), and cardiac catheterization can show whether plaque is narrowing or blocking your heart's arteries. This procedure is also used to evaluate the valves, as well as measure the blood flow and oxygen levels in different parts of your heart. For further information please visit [email or web address] Please follow instruction sheet, as given.  Follow-Up: At [facility], you and your health needs are our priority.  As part of our continuing mission to provide you with exceptional heart care, we have created designated Provider Care Teams.  These Care Teams include your primary Cardiologist (physician) and Advanced Practice Providers (APPs -  Physician Assistants and Nurse Practitioners) who all work together to provide you with the care you need, when you need it.  Your next appointment:   Structural Team will follow-up  Provider:    Tonny Bollman MD   Other Instructions       Cardiac/Peripheral Catheterization   You are scheduled for a Cardiac Catheterization on Friday, June 14 with Dr. Tonny Bollman.  1. Please arrive at the [facility] (Main Entrance A) at [facility]: [address] [place], [place] [address] at 7:00 AM (This time is two hour(s) before your procedure to ensure your preparation). Free valet parking service is available. You will check in at ADMITTING. The support person will be asked to wait in the waiting room.  It is OK to have someone drop you off and come back when you are ready to be discharged.        Special note: Every effort is made to have your procedure done on time. Please understand that emergencies sometimes delay scheduled procedures.  2. Diet: Do not eat solid foods after midnight.  You may have clear liquids until 5 AM the day of the procedure.  3. Labs: You will need to have blood drawn thirty days prior to procedure  4. Medication instructions in preparation for your procedure:   Contrast Allergy: No  On the morning of your procedure, take Aspirin 81 mg and any morning medicines NOT listed above.  You may use sips of water.  5. Plan to go home the same day, you will only stay overnight if medically necessary. 6. You MUST have a responsible adult to drive you home. 7. An adult MUST be with you the first 24 hours after you arrive home. 8. Bring a current list of your medications, and the last time and date medication taken. 9. Bring ID and current insurance cards. 10.Please wear clothes that are easy to get on and off and wear slip-on shoes.  Thank you for allowing [place] to care for you!   -- [facility] In[facility] Cardiovascular services    Signed, Tonny Bollman, MD  07/01/2022 4:51 PM    [facility]

## 2022-07-01 NOTE — Patient Instructions (Signed)
Medication Instructions:  Your physician recommends that you continue on your current medications as directed. Please refer to the Current Medication list given to you today.  *If you need a refill on your cardiac medications before your next appointment, please call your pharmacy*   Lab Work: BMET (within 30 days of procedure) If you have labs (blood work) drawn today and your tests are completely normal, you will receive your results only by: MyChart Message (if you have MyChart) OR A paper copy in the mail If you have any lab test that is abnormal or we need to change your treatment, we will call you to review the results.   Testing/Procedures: R & L heart catheterization Your physician has requested that you have a cardiac catheterization. Cardiac catheterization is used to diagnose and/or treat various heart conditions. Doctors may recommend this procedure for a number of different reasons. The most common reason is to evaluate chest pain. Chest pain can be a symptom of coronary artery disease (CAD), and cardiac catheterization can show whether plaque is narrowing or blocking your heart's arteries. This procedure is also used to evaluate the valves, as well as measure the blood flow and oxygen levels in different parts of your heart. For further information please visit [email or web address] Please follow instruction sheet, as given.  Follow-Up: At [facility], you and your health needs are our priority.  As part of our continuing mission to provide you with exceptional heart care, we have created designated Provider Care Teams.  These Care Teams include your primary Cardiologist (physician) and Advanced Practice Providers (APPs -  Physician Assistants and Nurse Practitioners) who all work together to provide you with the care you need, when you need it.  Your next appointment:   Structural Team will follow-up  Provider:   Tonny Bollman MD   Other Instructions        Cardiac/Peripheral Catheterization   You are scheduled for a Cardiac Catheterization on Friday, June 14 with Dr. Tonny Bollman.  1. Please arrive at the [facility] (Main Entrance A) at [facility]: [address] [place], [place] [address] at 7:00 AM (This time is two hour(s) before your procedure to ensure your preparation). Free valet parking service is available. You will check in at ADMITTING. The support person will be asked to wait in the waiting room.  It is OK to have someone drop you off and come back when you are ready to be discharged.        Special note: Every effort is made to have your procedure done on time. Please understand that emergencies sometimes delay scheduled procedures.  2. Diet: Do not eat solid foods after midnight.  You may have clear liquids until 5 AM the day of the procedure.  3. Labs: You will need to have blood drawn thirty days prior to procedure  4. Medication instructions in preparation for your procedure:   Contrast Allergy: No  On the morning of your procedure, take Aspirin 81 mg and any morning medicines NOT listed above.  You may use sips of water.  5. Plan to go home the same day, you will only stay overnight if medically necessary. 6. You MUST have a responsible adult to drive you home. 7. An adult MUST be with you the first 24 hours after you arrive home. 8. Bring a current list of your medications, and the last time and date medication taken. 9. Bring ID and current insurance cards. 10.Please wear clothes that are  easy to get on and off and wear slip-on shoes.  Thank you for allowing [place] to care for you!   -- [place] [facility]

## 2022-07-01 NOTE — Progress Notes (Addendum)
Pre Surgical Assessment: 5 M Walk Test  61M=16.84ft  5 Meter Walk Test- trial 1: 7.21 seconds 5 Meter Walk Test- trial 2: 7.30 seconds 5 Meter Walk Test- trial 3: 6.22 seconds 5 Meter Walk Test Average: 6.91 seconds    STS Surgical Risk calculation:   Procedure Type: Isolated AVR PERIOPERATIVE OUTCOME ESTIMATE % Operative Mortality 9.76% Morbidity & Mortality 28.6% Stroke 4.61% Renal Failure NA Reoperation 5.71% Prolonged Ventilation 17.2% Deep Sternal Wound Infection 0.297% Long Hospital Stay (>14 days) 25.5% Short Hospital Stay (<6 days)* 8.76%

## 2022-07-02 DIAGNOSIS — D631 Anemia in chronic kidney disease: Secondary | ICD-10-CM | POA: Diagnosis not present

## 2022-07-02 DIAGNOSIS — E039 Hypothyroidism, unspecified: Secondary | ICD-10-CM | POA: Diagnosis not present

## 2022-07-02 DIAGNOSIS — Z992 Dependence on renal dialysis: Secondary | ICD-10-CM | POA: Diagnosis not present

## 2022-07-02 DIAGNOSIS — N2581 Secondary hyperparathyroidism of renal origin: Secondary | ICD-10-CM | POA: Diagnosis not present

## 2022-07-02 DIAGNOSIS — E1122 Type 2 diabetes mellitus with diabetic chronic kidney disease: Secondary | ICD-10-CM | POA: Diagnosis not present

## 2022-07-02 DIAGNOSIS — N186 End stage renal disease: Secondary | ICD-10-CM | POA: Diagnosis not present

## 2022-07-02 NOTE — Progress Notes (Signed)
Remote pacemaker transmission.   

## 2022-07-04 DIAGNOSIS — E1122 Type 2 diabetes mellitus with diabetic chronic kidney disease: Secondary | ICD-10-CM | POA: Diagnosis not present

## 2022-07-04 DIAGNOSIS — N2581 Secondary hyperparathyroidism of renal origin: Secondary | ICD-10-CM | POA: Diagnosis not present

## 2022-07-04 DIAGNOSIS — N186 End stage renal disease: Secondary | ICD-10-CM | POA: Diagnosis not present

## 2022-07-04 DIAGNOSIS — Z992 Dependence on renal dialysis: Secondary | ICD-10-CM | POA: Diagnosis not present

## 2022-07-04 DIAGNOSIS — D631 Anemia in chronic kidney disease: Secondary | ICD-10-CM | POA: Diagnosis not present

## 2022-07-07 DIAGNOSIS — D631 Anemia in chronic kidney disease: Secondary | ICD-10-CM | POA: Diagnosis not present

## 2022-07-07 DIAGNOSIS — E1122 Type 2 diabetes mellitus with diabetic chronic kidney disease: Secondary | ICD-10-CM | POA: Diagnosis not present

## 2022-07-07 DIAGNOSIS — Z992 Dependence on renal dialysis: Secondary | ICD-10-CM | POA: Diagnosis not present

## 2022-07-07 DIAGNOSIS — N2581 Secondary hyperparathyroidism of renal origin: Secondary | ICD-10-CM | POA: Diagnosis not present

## 2022-07-07 DIAGNOSIS — N186 End stage renal disease: Secondary | ICD-10-CM | POA: Diagnosis not present

## 2022-07-09 DIAGNOSIS — Z992 Dependence on renal dialysis: Secondary | ICD-10-CM | POA: Diagnosis not present

## 2022-07-09 DIAGNOSIS — N2581 Secondary hyperparathyroidism of renal origin: Secondary | ICD-10-CM | POA: Diagnosis not present

## 2022-07-09 DIAGNOSIS — D631 Anemia in chronic kidney disease: Secondary | ICD-10-CM | POA: Diagnosis not present

## 2022-07-09 DIAGNOSIS — N186 End stage renal disease: Secondary | ICD-10-CM | POA: Diagnosis not present

## 2022-07-09 DIAGNOSIS — E1122 Type 2 diabetes mellitus with diabetic chronic kidney disease: Secondary | ICD-10-CM | POA: Diagnosis not present

## 2022-07-14 DIAGNOSIS — E1122 Type 2 diabetes mellitus with diabetic chronic kidney disease: Secondary | ICD-10-CM | POA: Diagnosis not present

## 2022-07-14 DIAGNOSIS — N186 End stage renal disease: Secondary | ICD-10-CM | POA: Diagnosis not present

## 2022-07-14 DIAGNOSIS — N2581 Secondary hyperparathyroidism of renal origin: Secondary | ICD-10-CM | POA: Diagnosis not present

## 2022-07-14 DIAGNOSIS — D631 Anemia in chronic kidney disease: Secondary | ICD-10-CM | POA: Diagnosis not present

## 2022-07-14 DIAGNOSIS — Z992 Dependence on renal dialysis: Secondary | ICD-10-CM | POA: Diagnosis not present

## 2022-07-16 DIAGNOSIS — Z992 Dependence on renal dialysis: Secondary | ICD-10-CM | POA: Diagnosis not present

## 2022-07-16 DIAGNOSIS — N2581 Secondary hyperparathyroidism of renal origin: Secondary | ICD-10-CM | POA: Diagnosis not present

## 2022-07-16 DIAGNOSIS — N186 End stage renal disease: Secondary | ICD-10-CM | POA: Diagnosis not present

## 2022-07-16 DIAGNOSIS — D631 Anemia in chronic kidney disease: Secondary | ICD-10-CM | POA: Diagnosis not present

## 2022-07-16 DIAGNOSIS — E1122 Type 2 diabetes mellitus with diabetic chronic kidney disease: Secondary | ICD-10-CM | POA: Diagnosis not present

## 2022-07-18 DIAGNOSIS — I129 Hypertensive chronic kidney disease with stage 1 through stage 4 chronic kidney disease, or unspecified chronic kidney disease: Secondary | ICD-10-CM | POA: Diagnosis not present

## 2022-07-18 DIAGNOSIS — N2581 Secondary hyperparathyroidism of renal origin: Secondary | ICD-10-CM | POA: Diagnosis not present

## 2022-07-18 DIAGNOSIS — Z992 Dependence on renal dialysis: Secondary | ICD-10-CM | POA: Diagnosis not present

## 2022-07-18 DIAGNOSIS — D631 Anemia in chronic kidney disease: Secondary | ICD-10-CM | POA: Diagnosis not present

## 2022-07-18 DIAGNOSIS — N186 End stage renal disease: Secondary | ICD-10-CM | POA: Diagnosis not present

## 2022-07-18 DIAGNOSIS — E1122 Type 2 diabetes mellitus with diabetic chronic kidney disease: Secondary | ICD-10-CM | POA: Diagnosis not present

## 2022-07-21 DIAGNOSIS — Z992 Dependence on renal dialysis: Secondary | ICD-10-CM | POA: Diagnosis not present

## 2022-07-21 DIAGNOSIS — D631 Anemia in chronic kidney disease: Secondary | ICD-10-CM | POA: Diagnosis not present

## 2022-07-21 DIAGNOSIS — N2581 Secondary hyperparathyroidism of renal origin: Secondary | ICD-10-CM | POA: Diagnosis not present

## 2022-07-21 DIAGNOSIS — N186 End stage renal disease: Secondary | ICD-10-CM | POA: Diagnosis not present

## 2022-07-21 DIAGNOSIS — E1122 Type 2 diabetes mellitus with diabetic chronic kidney disease: Secondary | ICD-10-CM | POA: Diagnosis not present

## 2022-07-22 ENCOUNTER — Ambulatory Visit: Payer: Medicare Other | Attending: Cardiovascular Disease

## 2022-07-22 DIAGNOSIS — Z01810 Encounter for preprocedural cardiovascular examination: Secondary | ICD-10-CM | POA: Diagnosis not present

## 2022-07-22 DIAGNOSIS — I350 Nonrheumatic aortic (valve) stenosis: Secondary | ICD-10-CM | POA: Diagnosis not present

## 2022-07-23 ENCOUNTER — Other Ambulatory Visit: Payer: Medicare Other

## 2022-07-23 DIAGNOSIS — E1122 Type 2 diabetes mellitus with diabetic chronic kidney disease: Secondary | ICD-10-CM | POA: Diagnosis not present

## 2022-07-23 DIAGNOSIS — N186 End stage renal disease: Secondary | ICD-10-CM | POA: Diagnosis not present

## 2022-07-23 DIAGNOSIS — Z992 Dependence on renal dialysis: Secondary | ICD-10-CM | POA: Diagnosis not present

## 2022-07-23 DIAGNOSIS — D631 Anemia in chronic kidney disease: Secondary | ICD-10-CM | POA: Diagnosis not present

## 2022-07-23 DIAGNOSIS — N2581 Secondary hyperparathyroidism of renal origin: Secondary | ICD-10-CM | POA: Diagnosis not present

## 2022-07-23 LAB — CBC
Hematocrit: 29.5 % — ABNORMAL LOW (ref 37.5–51.0)
Hemoglobin: 9.9 g/dL — ABNORMAL LOW (ref 13.0–17.7)
MCH: 32.4 pg (ref 26.6–33.0)
MCHC: 33.6 g/dL (ref 31.5–35.7)
MCV: 96 fL (ref 79–97)
Platelets: 172 x10E3/uL (ref 150–450)
RBC: 3.06 x10E6/uL — ABNORMAL LOW (ref 4.14–5.80)
RDW: 15.8 % — ABNORMAL HIGH (ref 11.6–15.4)
WBC: 7.7 x10E3/uL (ref 3.4–10.8)

## 2022-07-23 LAB — BASIC METABOLIC PANEL
BUN/Creatinine Ratio: 7 — ABNORMAL LOW (ref 10–24)
BUN: 42 mg/dL — ABNORMAL HIGH (ref 8–27)
CO2: 27 mmol/L (ref 20–29)
Calcium: 9.7 mg/dL (ref 8.6–10.2)
Chloride: 95 mmol/L — ABNORMAL LOW (ref 96–106)
Creatinine, Ser: 6.08 mg/dL — ABNORMAL HIGH (ref 0.76–1.27)
Glucose: 224 mg/dL — ABNORMAL HIGH (ref 70–99)
Potassium: 4.2 mmol/L (ref 3.5–5.2)
Sodium: 139 mmol/L (ref 134–144)
eGFR: 9 mL/min/{1.73_m2} — ABNORMAL LOW (ref >59)

## 2022-07-25 DIAGNOSIS — Z992 Dependence on renal dialysis: Secondary | ICD-10-CM | POA: Diagnosis not present

## 2022-07-25 DIAGNOSIS — N186 End stage renal disease: Secondary | ICD-10-CM | POA: Diagnosis not present

## 2022-07-25 DIAGNOSIS — N2581 Secondary hyperparathyroidism of renal origin: Secondary | ICD-10-CM | POA: Diagnosis not present

## 2022-07-25 DIAGNOSIS — E1122 Type 2 diabetes mellitus with diabetic chronic kidney disease: Secondary | ICD-10-CM | POA: Diagnosis not present

## 2022-07-25 DIAGNOSIS — D631 Anemia in chronic kidney disease: Secondary | ICD-10-CM | POA: Diagnosis not present

## 2022-07-28 DIAGNOSIS — D631 Anemia in chronic kidney disease: Secondary | ICD-10-CM | POA: Diagnosis not present

## 2022-07-28 DIAGNOSIS — N186 End stage renal disease: Secondary | ICD-10-CM | POA: Diagnosis not present

## 2022-07-28 DIAGNOSIS — Z992 Dependence on renal dialysis: Secondary | ICD-10-CM | POA: Diagnosis not present

## 2022-07-28 DIAGNOSIS — E1122 Type 2 diabetes mellitus with diabetic chronic kidney disease: Secondary | ICD-10-CM | POA: Diagnosis not present

## 2022-07-28 DIAGNOSIS — N2581 Secondary hyperparathyroidism of renal origin: Secondary | ICD-10-CM | POA: Diagnosis not present

## 2022-07-30 ENCOUNTER — Telehealth: Payer: Self-pay | Admitting: *Deleted

## 2022-07-30 ENCOUNTER — Other Ambulatory Visit: Payer: Self-pay | Admitting: Physician Assistant

## 2022-07-30 ENCOUNTER — Encounter: Payer: Self-pay | Admitting: Physician Assistant

## 2022-07-30 DIAGNOSIS — N186 End stage renal disease: Secondary | ICD-10-CM | POA: Diagnosis not present

## 2022-07-30 DIAGNOSIS — D631 Anemia in chronic kidney disease: Secondary | ICD-10-CM | POA: Diagnosis not present

## 2022-07-30 DIAGNOSIS — I350 Nonrheumatic aortic (valve) stenosis: Secondary | ICD-10-CM

## 2022-07-30 DIAGNOSIS — N2581 Secondary hyperparathyroidism of renal origin: Secondary | ICD-10-CM | POA: Diagnosis not present

## 2022-07-30 DIAGNOSIS — Z992 Dependence on renal dialysis: Secondary | ICD-10-CM | POA: Diagnosis not present

## 2022-07-30 DIAGNOSIS — E1122 Type 2 diabetes mellitus with diabetic chronic kidney disease: Secondary | ICD-10-CM | POA: Diagnosis not present

## 2022-07-30 NOTE — Telephone Encounter (Addendum)
Cardiac Catheterization scheduled at [facility] for: Friday July 31, 2022 9 AM Arrival time [facility] Main Entrance A at: 7 AM  Nothing to eat after midnight prior to procedure, clear liquids until 5 AM day of procedure.  Medication instructions: -Do not take:  Insulin-AM of procedure/take 1/2 usual dose HS prior to procedure -Except hold medications usual morning medications can be taken with sips of water including aspirin 81 mg.  Confirmed patient has responsible adult to drive home post procedure and be with patient first 24 hours after arriving home.  Plan to go home the same day, you will only stay overnight if medically necessary.  Reviewed procedure instructions with patient's wife, Huntley Dec [facility]).  Confirmed Tu-Th-Sat dialysis schedule.  Dr Excell Seltzer aware cath scheduled with Dr Herbie Baltimore due to his schedule. Patient's wife aware cath scheduled with Dr Herbie Baltimore.

## 2022-07-31 ENCOUNTER — Encounter (HOSPITAL_COMMUNITY)
Admission: RE | Disposition: A | Discharge status: Home / Self Care | Payer: Self-pay | Source: Home / Self Care | Attending: Cardiology

## 2022-07-31 ENCOUNTER — Ambulatory Visit (HOSPITAL_COMMUNITY)
Admission: RE | Admit: 2022-07-31 | Discharge: 2022-07-31 | Disposition: A | Discharge status: Home / Self Care | Payer: Medicare Other | Attending: Cardiology | Admitting: Cardiology

## 2022-07-31 ENCOUNTER — Inpatient Hospital Stay: Payer: Medicare Other

## 2022-07-31 DIAGNOSIS — I25119 Atherosclerotic heart disease of native coronary artery with unspecified angina pectoris: Secondary | ICD-10-CM | POA: Diagnosis not present

## 2022-07-31 DIAGNOSIS — I350 Nonrheumatic aortic (valve) stenosis: Secondary | ICD-10-CM | POA: Diagnosis not present

## 2022-07-31 DIAGNOSIS — D631 Anemia in chronic kidney disease: Secondary | ICD-10-CM | POA: Insufficient documentation

## 2022-07-31 DIAGNOSIS — I272 Pulmonary hypertension, unspecified: Secondary | ICD-10-CM | POA: Insufficient documentation

## 2022-07-31 DIAGNOSIS — I120 Hypertensive chronic kidney disease with stage 5 chronic kidney disease or end stage renal disease: Secondary | ICD-10-CM | POA: Insufficient documentation

## 2022-07-31 DIAGNOSIS — Z955 Presence of coronary angioplasty implant and graft: Secondary | ICD-10-CM | POA: Diagnosis not present

## 2022-07-31 DIAGNOSIS — I480 Paroxysmal atrial fibrillation: Secondary | ICD-10-CM | POA: Insufficient documentation

## 2022-07-31 DIAGNOSIS — I2582 Chronic total occlusion of coronary artery: Secondary | ICD-10-CM | POA: Diagnosis not present

## 2022-07-31 DIAGNOSIS — Z951 Presence of aortocoronary bypass graft: Secondary | ICD-10-CM | POA: Diagnosis not present

## 2022-07-31 DIAGNOSIS — N186 End stage renal disease: Secondary | ICD-10-CM | POA: Diagnosis not present

## 2022-07-31 HISTORY — PX: RIGHT/LEFT HEART CATH AND CORONARY/GRAFT ANGIOGRAPHY: CATH118267

## 2022-07-31 LAB — POCT I-STAT 7, (LYTES, BLD GAS, ICA,H+H)
Acid-Base Excess: 2 mmol/L (ref 0.0–2.0)
Bicarbonate: 26.6 mmol/L (ref 20.0–28.0)
Calcium, Ion: 1.15 mmol/L (ref 1.15–1.40)
HCT: 28 % — ABNORMAL LOW (ref 39.0–52.0)
Hemoglobin: 9.5 g/dL — ABNORMAL LOW (ref 13.0–17.0)
O2 Saturation: 94 %
Potassium: 3.5 mmol/L (ref 3.5–5.1)
Sodium: 139 mmol/L (ref 135–145)
TCO2: 28 mmol/L (ref 22–32)
pCO2 arterial: 42 mmHg (ref 32–48)
pH, Arterial: 7.409 (ref 7.35–7.45)
pO2, Arterial: 71 mmHg — ABNORMAL LOW (ref 83–108)

## 2022-07-31 LAB — POCT I-STAT EG7
Acid-Base Excess: 3 mmol/L — ABNORMAL HIGH (ref 0.0–2.0)
Acid-Base Excess: 2 mmol/L (ref 0.0–2.0)
Bicarbonate: 28.3 mmol/L — ABNORMAL HIGH (ref 20.0–28.0)
Bicarbonate: 27.7 mmol/L (ref 20.0–28.0)
Calcium, Ion: 1.18 mmol/L (ref 1.15–1.40)
Calcium, Ion: 1.15 mmol/L (ref 1.15–1.40)
HCT: 28 % — ABNORMAL LOW (ref 39.0–52.0)
HCT: 27 % — ABNORMAL LOW (ref 39.0–52.0)
Hemoglobin: 9.5 g/dL — ABNORMAL LOW (ref 13.0–17.0)
Hemoglobin: 9.2 g/dL — ABNORMAL LOW (ref 13.0–17.0)
O2 Saturation: 64 %
O2 Saturation: 62 %
Potassium: 3.5 mmol/L (ref 3.5–5.1)
Potassium: 3.4 mmol/L — ABNORMAL LOW (ref 3.5–5.1)
Sodium: 139 mmol/L (ref 135–145)
Sodium: 140 mmol/L (ref 135–145)
TCO2: 30 mmol/L (ref 22–32)
TCO2: 29 mmol/L (ref 22–32)
pCO2, Ven: 47.2 mmHg (ref 44–60)
pCO2, Ven: 46.4 mmHg (ref 44–60)
pH, Ven: 7.387 (ref 7.25–7.43)
pH, Ven: 7.384 (ref 7.25–7.43)
pO2, Ven: 34 mmHg (ref 32–45)
pO2, Ven: 33 mmHg (ref 32–45)

## 2022-07-31 LAB — GLUCOSE, CAPILLARY
Glucose-Capillary: 112 mg/dL — ABNORMAL HIGH (ref 70–99)
Glucose-Capillary: 91 mg/dL (ref 70–99)

## 2022-07-31 SURGERY — RIGHT/LEFT HEART CATH AND CORONARY/GRAFT ANGIOGRAPHY
Anesthesia: LOCAL

## 2022-07-31 MED ORDER — HYDRALAZINE HCL 20 MG/ML IJ SOLN: 10.0000 mg | INTRAMUSCULAR | Status: DC | PRN

## 2022-07-31 MED ORDER — MIDAZOLAM HCL 2 MG/2ML IJ SOLN
INTRAMUSCULAR | Status: AC
  Filled 2022-07-31: qty 2

## 2022-07-31 MED ORDER — HEPARIN (PORCINE) IN NACL 1000-0.9 UT/500ML-% IV SOLN
INTRAVENOUS | Status: DC | PRN
  Administered 2022-07-31 (×2): 500 mL

## 2022-07-31 MED ORDER — ACETAMINOPHEN 325 MG PO TABS: 650.0000 mg | ORAL_TABLET | ORAL | Status: DC | PRN

## 2022-07-31 MED ORDER — SODIUM CHLORIDE 0.9 % IV SOLN: 250.0000 mL | INTRAVENOUS | Status: DC | PRN

## 2022-07-31 MED ORDER — LIDOCAINE HCL (PF) 1 % IJ SOLN
INTRAMUSCULAR | Status: DC | PRN
  Administered 2022-07-31: 10 mL

## 2022-07-31 MED ORDER — LABETALOL HCL 5 MG/ML IV SOLN: 10.0000 mg | INTRAVENOUS | Status: DC | PRN

## 2022-07-31 MED ORDER — SODIUM CHLORIDE 0.9 % IV SOLN: INTRAVENOUS | Status: DC

## 2022-07-31 MED ORDER — MIDAZOLAM HCL 2 MG/2ML IJ SOLN
INTRAMUSCULAR | Status: DC | PRN
  Administered 2022-07-31: 1 mg via INTRAVENOUS

## 2022-07-31 MED ORDER — SODIUM CHLORIDE 0.9% FLUSH: 3.0000 mL | Freq: Two times a day (BID) | INTRAVENOUS | Status: DC

## 2022-07-31 MED ORDER — IOHEXOL 350 MG/ML SOLN
INTRAVENOUS | Status: DC | PRN
  Administered 2022-07-31: 90 mL

## 2022-07-31 MED ORDER — ONDANSETRON HCL 4 MG/2ML IJ SOLN: 4.0000 mg | Freq: Four times a day (QID) | INTRAMUSCULAR | Status: DC | PRN

## 2022-07-31 MED ORDER — SODIUM CHLORIDE 0.9% FLUSH: 3.0000 mL | INTRAVENOUS | Status: DC | PRN

## 2022-07-31 MED ORDER — LIDOCAINE HCL (PF) 1 % IJ SOLN
INTRAMUSCULAR | Status: AC
  Filled 2022-07-31: qty 30

## 2022-07-31 MED ORDER — ASPIRIN 81 MG PO CHEW: 81.0000 mg | CHEWABLE_TABLET | ORAL | Status: DC

## 2022-07-31 SURGICAL SUPPLY — 14 items
CATH INFINITI 5FR AL1 (CATHETERS) IMPLANT
CATH INFINITI 5FR MULTPACK ANG (CATHETERS) IMPLANT
CATH SWAN GANZ 7F STRAIGHT (CATHETERS) IMPLANT
CLOSURE MYNX CONTROL 5F (Vascular Products) IMPLANT
KIT HEART LEFT (KITS) ×1 IMPLANT
KIT MICROPUNCTURE NIT STIFF (SHEATH) IMPLANT
MAT PREVALON FULL STRYKER (MISCELLANEOUS) IMPLANT
PACK CARDIAC CATHETERIZATION (CUSTOM PROCEDURE TRAY) ×1 IMPLANT
SHEATH PINNACLE 5F 10CM (SHEATH) IMPLANT
SHEATH PINNACLE 7F 10CM (SHEATH) IMPLANT
TRANSDUCER W/STOPCOCK (MISCELLANEOUS) ×1 IMPLANT
WIRE EMERALD 3MM-J .025X260CM (WIRE) IMPLANT
WIRE EMERALD 3MM-J .035X150CM (WIRE) IMPLANT
WIRE HI TORQ VERSACORE-J 145CM (WIRE) IMPLANT

## 2022-07-31 NOTE — Interval H&P Note (Signed)
History and Physical Interval Note:  07/31/2022 7:47 AM  Bradley Soto  has presented today for surgery, with the diagnosis of aortic stenosis.  The various methods of treatment have been discussed with the patient and family. After consideration of risks, benefits and other options for treatment, the patient has consented to  Procedure(s): RIGHT/LEFT HEART CATH AND CORONARY/GRAFT ANGIOGRAPHY (N/A)  PERCUTANEOUS CORONARY INTERVENTION  as a surgical intervention.  The patient's history has been reviewed, patient examined, no change in status, stable for surgery.  I have reviewed the patient's chart and labs.  Questions were answered to the patient's satisfaction.     Bryan Lemma

## 2022-07-31 NOTE — Discharge Instructions (Signed)
Femoral Site Care This sheet gives you information about how to care for yourself after your procedure. Your health care provider may also give you more specific instructions. If you have problems or questions, contact your health care provider. What can I expect after the procedure?  After the procedure, it is common to have: Bruising that usually fades within 1-2 weeks. Tenderness at the site. Follow these instructions at home: Wound care Follow instructions from your health care provider about how to take care of your insertion site. Make sure you: Wash your hands with soap and water before you change your bandage (dressing). If soap and water are not available, use hand sanitizer. Remove your dressing as told by your health care provider. In 24 hours Do not take baths, swim, or use a hot tub until your health care provider approves. You may shower 24-48 hours after the procedure or as told by your health care provider. Gently wash the site with plain soap and water. Pat the area dry with a clean towel. Do not rub the site. This may cause bleeding. Do not apply powder or lotion to the site. Keep the site clean and dry. Check your femoral site every day for signs of infection. Check for: Redness, swelling, or pain. Fluid or blood. Warmth. Pus or a bad smell. Activity For the first 2-3 days after your procedure, or as long as directed: Avoid climbing stairs as much as possible. Do not squat. Do not lift anything that is heavier than 10 lb (4.5 kg), or the limit that you are told, until your health care provider says that it is safe. For 5 days Rest as directed. Avoid sitting for a long time without moving. Get up to take short walks every 1-2 hours. Do not drive for 24 hours if you were given a medicine to help you relax (sedative). General instructions Take over-the-counter and prescription medicines only as told by your health care provider. Keep all follow-up visits as told by  your health care provider. This is important. Contact a health care provider if you have: A fever or chills. You have redness, swelling, or pain around your insertion site. Get help right away if: The catheter insertion area swells very fast. You pass out. You suddenly start to sweat or your skin gets clammy. The catheter insertion area is bleeding, and the bleeding does not stop when you hold steady pressure on the area. The area near or just beyond the catheter insertion site becomes pale, cool, tingly, or numb. These symptoms may represent a serious problem that is an emergency. Do not wait to see if the symptoms will go away. Get medical help right away. Call your local emergency services (911 in the [place] Do not drive yourself to the hospital. Summary After the procedure, it is common to have bruising that usually fades within 1-2 weeks. Check your femoral site every day for signs of infection. Do not lift anything that is heavier than 10 lb (4.5 kg), or the limit that you are told, until your health care provider says that it is safe. This information is not intended to replace advice given to you by your health care provider. Make sure you discuss any questions you have with your health care provider. Document Revised: 02/15/2017 Document Reviewed: 02/15/2017 Elsevier Patient Education  2020 [employer] 

## 2022-08-01 DIAGNOSIS — Z992 Dependence on renal dialysis: Secondary | ICD-10-CM | POA: Diagnosis not present

## 2022-08-01 DIAGNOSIS — N186 End stage renal disease: Secondary | ICD-10-CM | POA: Diagnosis not present

## 2022-08-01 DIAGNOSIS — N2581 Secondary hyperparathyroidism of renal origin: Secondary | ICD-10-CM | POA: Diagnosis not present

## 2022-08-01 DIAGNOSIS — E1122 Type 2 diabetes mellitus with diabetic chronic kidney disease: Secondary | ICD-10-CM | POA: Diagnosis not present

## 2022-08-01 DIAGNOSIS — D631 Anemia in chronic kidney disease: Secondary | ICD-10-CM | POA: Diagnosis not present

## 2022-08-03 ENCOUNTER — Encounter (HOSPITAL_COMMUNITY): Payer: Self-pay | Admitting: Cardiology

## 2022-08-04 DIAGNOSIS — Z992 Dependence on renal dialysis: Secondary | ICD-10-CM | POA: Diagnosis not present

## 2022-08-04 DIAGNOSIS — D631 Anemia in chronic kidney disease: Secondary | ICD-10-CM | POA: Diagnosis not present

## 2022-08-04 DIAGNOSIS — N186 End stage renal disease: Secondary | ICD-10-CM | POA: Diagnosis not present

## 2022-08-04 DIAGNOSIS — E1122 Type 2 diabetes mellitus with diabetic chronic kidney disease: Secondary | ICD-10-CM | POA: Diagnosis not present

## 2022-08-04 DIAGNOSIS — N2581 Secondary hyperparathyroidism of renal origin: Secondary | ICD-10-CM | POA: Diagnosis not present

## 2022-08-05 ENCOUNTER — Other Ambulatory Visit: Payer: Self-pay

## 2022-08-05 ENCOUNTER — Emergency Department (HOSPITAL_COMMUNITY): Payer: Medicare Other

## 2022-08-05 ENCOUNTER — Ambulatory Visit (HOSPITAL_COMMUNITY): Admission: RE | Admit: 2022-08-05 | Payer: Medicare Other | Source: Ambulatory Visit

## 2022-08-05 ENCOUNTER — Encounter (HOSPITAL_COMMUNITY): Payer: Self-pay

## 2022-08-05 ENCOUNTER — Emergency Department (HOSPITAL_COMMUNITY)
Admission: EM | Admit: 2022-08-05 | Discharge: 2022-08-05 | Disposition: A | Discharge status: Home / Self Care | Payer: Medicare Other | Attending: Emergency Medicine | Admitting: Emergency Medicine

## 2022-08-05 DIAGNOSIS — S0003XA Contusion of scalp, initial encounter: Secondary | ICD-10-CM | POA: Diagnosis not present

## 2022-08-05 DIAGNOSIS — S42114A Nondisplaced fracture of body of scapula, right shoulder, initial encounter for closed fracture: Secondary | ICD-10-CM

## 2022-08-05 DIAGNOSIS — Z7982 Long term (current) use of aspirin: Secondary | ICD-10-CM | POA: Diagnosis not present

## 2022-08-05 DIAGNOSIS — S42101A Fracture of unspecified part of scapula, right shoulder, initial encounter for closed fracture: Secondary | ICD-10-CM | POA: Diagnosis not present

## 2022-08-05 DIAGNOSIS — W010XXA Fall on same level from slipping, tripping and stumbling without subsequent striking against object, initial encounter: Secondary | ICD-10-CM | POA: Insufficient documentation

## 2022-08-05 DIAGNOSIS — Z992 Dependence on renal dialysis: Secondary | ICD-10-CM | POA: Insufficient documentation

## 2022-08-05 DIAGNOSIS — Z7984 Long term (current) use of oral hypoglycemic drugs: Secondary | ICD-10-CM | POA: Insufficient documentation

## 2022-08-05 DIAGNOSIS — E1122 Type 2 diabetes mellitus with diabetic chronic kidney disease: Secondary | ICD-10-CM | POA: Insufficient documentation

## 2022-08-05 DIAGNOSIS — S4991XA Unspecified injury of right shoulder and upper arm, initial encounter: Secondary | ICD-10-CM | POA: Diagnosis present

## 2022-08-05 DIAGNOSIS — I2510 Atherosclerotic heart disease of native coronary artery without angina pectoris: Secondary | ICD-10-CM | POA: Insufficient documentation

## 2022-08-05 DIAGNOSIS — N186 End stage renal disease: Secondary | ICD-10-CM | POA: Insufficient documentation

## 2022-08-05 DIAGNOSIS — I120 Hypertensive chronic kidney disease with stage 5 chronic kidney disease or end stage renal disease: Secondary | ICD-10-CM | POA: Diagnosis not present

## 2022-08-05 DIAGNOSIS — W19XXXA Unspecified fall, initial encounter: Secondary | ICD-10-CM

## 2022-08-05 DIAGNOSIS — S0990XA Unspecified injury of head, initial encounter: Secondary | ICD-10-CM | POA: Diagnosis not present

## 2022-08-05 DIAGNOSIS — S40011A Contusion of right shoulder, initial encounter: Secondary | ICD-10-CM | POA: Diagnosis not present

## 2022-08-05 MED ORDER — FENTANYL CITRATE PF 50 MCG/ML IJ SOSY
25.0000 ug | PREFILLED_SYRINGE | Freq: Once | INTRAMUSCULAR | Status: AC
  Administered 2022-08-05: 25 ug via INTRAVENOUS
  Filled 2022-08-05: qty 1

## 2022-08-05 MED ORDER — OXYCODONE-ACETAMINOPHEN 5-325 MG PO TABS: 1.0000 | ORAL_TABLET | Freq: Four times a day (QID) | ORAL | 0 refills | Status: DC | PRN

## 2022-08-05 NOTE — ED Provider Notes (Signed)
[facility] AT [facility] Provider Note   CSN: [id number] Arrival date & time: 08/05/22  8295     History  Chief Complaint  Patient presents with   Bradley Soto is a 82 y.o. male.   Fall Pertinent negatives include no chest pain, no abdominal pain, no headaches and no shortness of breath.       Bradley Soto is a 82 y.o. male with past medical history of type 2 diabetes, coronary artery disease, hypertension, end-stage renal disease on dialysis Tuesday Thursday Saturday, atrial fibrillation (not on blood thinner) who presents to the Emergency Department complaining of mechanical fall that occurred this morning just shortly before ER arrival.  States he slipped on a wet floor in the bathroom and fell landing on his right shoulder.  He does state that he struck his head on the wall but denies loss of consciousness headache or dizziness.  He has pain when he attempts to move his right shoulder.  He was assisted off the floor by his wife.  He denies any preceding symptoms.  He also denies any back pain, headache, dizziness, neck pain, visual changes, nausea or vomiting, pain of his pelvis or hip, chest, and shortness of breath.    Home Medications Prior to Admission medications   Medication Sig Start Date End Date Taking? Authorizing Provider  allopurinol (ZYLOPRIM) 100 MG tablet Take 1 tablet (100 mg total) by mouth daily. 11/03/19   [name], Earl Lites, MD  aspirin EC 81 MG tablet Take 81 mg by mouth in the morning.    [provider]  B Complex-C-Zn-Folic Acid (DIALYVITE 800 WITH ZINC) 0.8 MG TABS Take 1 tablet by mouth at bedtime. 11/08/19   [provider]  CALCITRIOL PO Take 4-5 tablets by mouth See admin instructions. Patient takes 4-5 calcitrol vitamin d tablets (1.75mg  per patient's spouse) during dialysis (T, TH, Sat).    [provider]  Copper 5 MG TABS Take 1 tablet (5 mg total) by mouth daily. Patient  taking differently: Take 10 mg by mouth in the morning. 12/15/21   Carnella Guadalajara, PA-C  [employer] VITAMIN B12 1000 MCG tablet TAKE 1 TABLET BY MOUTH EVERY DAY 03/26/22   Rojelio Brenner M, PA-C  diltiazem (CARDIZEM SR) 60 MG 12 hr capsule Take 1 capsule (60 mg total) by mouth as needed (palpitations). 05/22/22   Sharlene Dory, PA-C  gabapentin (NEURONTIN) 100 MG capsule Take 1 capsule (100 mg total) by mouth 3 (three) times daily. Patient taking differently: Take 300 mg by mouth in the morning. 09/24/21   Maeola Harman, MD  iron sucrose in sodium chloride 0.9 % 100 mL once a week. 08/26/21 09/22/22  [provider]  Methoxy PEG-Epoetin Beta (MIRCERA IJ) Mircera 05/23/22 05/22/23  [provider]  Methoxy PEG-Epoetin Beta (MIRCERA IJ) Mircera 06/09/22 06/08/23  [provider]  midodrine (PROAMATINE) 5 MG tablet Take by mouth. Per patient taking Tuesdays, Thursdays and Saturdays before dialysis 06/25/22   [provider]  nitroGLYCERIN (NITROSTAT) 0.4 MG SL tablet Place 1 tablet (0.4 mg total) under the tongue every 5 (five) minutes as needed for chest pain. 05/22/22   Sharlene Dory, PA-C  NOVOLOG MIX 70/30 FLEXPEN (70-30) 100 UNIT/ML FlexPen Inject 30 Units into the skin in the morning and at bedtime. 02/26/16   [provider]  sevelamer carbonate (RENVELA) 800 MG tablet Take 1 tablet (800 mg total) by mouth 3 (three) times daily with  meals. Patient taking differently: Take 2,400 mg by mouth 3 (three) times daily with meals. 11/03/19   [name], Earl Lites, MD      Allergies    Shellfish allergy    Review of Systems   Review of Systems  Constitutional:  Negative for appetite change, chills and fever.  Eyes:  Negative for visual disturbance.  Respiratory:  Negative for shortness of breath.   Cardiovascular:  Negative for chest pain.  Gastrointestinal:  Negative for abdominal pain, nausea and vomiting.  Genitourinary:  Negative for dysuria and  flank pain.  Musculoskeletal:  Positive for arthralgias (Right shoulder pain). Negative for back pain and neck pain.  Skin:  Negative for color change and wound.  Neurological:  Negative for dizziness, syncope, weakness, numbness and headaches.    Physical Exam Updated Vital Signs BP 116/73   Pulse 71   Temp 98.1 F (36.7 C) (Oral)   Resp 17   Ht 5\' 11"  (1.803 m)   Wt 99.8 kg   SpO2 96%   BMI 30.68 kg/m  Physical Exam Vitals and nursing note reviewed.  Constitutional:      General: He is not in acute distress.    Appearance: Normal appearance. He is not ill-appearing or toxic-appearing.  HENT:     Head: Atraumatic.  Eyes:     Conjunctiva/sclera: Conjunctivae normal.     Pupils: Pupils are equal, round, and reactive to light.  Neck:     Trachea: Phonation normal.  Cardiovascular:     Rate and Rhythm: Normal rate and regular rhythm.     Pulses: Normal pulses.     Comments: AV fistula with palpable thrill of the right upper arm Pulmonary:     Effort: Pulmonary effort is normal.  Abdominal:     Palpations: Abdomen is soft.     Tenderness: There is no abdominal tenderness.  Musculoskeletal:        General: Tenderness and signs of injury present. No swelling or deformity.     Right shoulder: Tenderness present. No swelling, deformity, bony tenderness or crepitus. Decreased range of motion. Normal strength. Normal pulse.     Cervical back: Full passive range of motion without pain and normal range of motion. No tenderness. No spinous process tenderness or muscular tenderness.     Right lower leg: No edema.     Left lower leg: No edema.     Comments: Tender to palpation with range of motion of the right shoulder.  I do not appreciate any bony deformities or step-offs.  Skin:    General: Skin is warm.     Capillary Refill: Capillary refill takes less than 2 seconds.     Findings: No bruising or rash.  Neurological:     General: No focal deficit present.     Mental Status:  He is alert.     Sensory: No sensory deficit.     Motor: No weakness.     ED Results / Procedures / Treatments   Labs (all labs ordered are listed, but only abnormal results are displayed) Labs Reviewed - No data to display  EKG None  Radiology No results found.  Procedures Procedures    Medications Ordered in ED Medications  fentaNYL (SUBLIMAZE) injection 25 mcg (has no administration in time range)    ED Course/ Medical Decision Making/ A&P                             Medical  Decision Making Patient here for evaluation of right shoulder pain secondary to mechanical fall that occurred this morning states he slipped on a wet bathroom floor.  Complains of pain of his right shoulder only.  He does admit that he struck his head on the side of the wall as he fell but denies loss of consciousness headache or dizziness.  He does not take blood thinners currently.  Differential would include but not limited to subdural hematoma, intracranial bleed, fracture, contusion, concussion, shoulder injury would include musculoskeletal injury, fracture, dislocation, tendon or ligament injury.  Amount and/or Complexity of Data Reviewed Radiology: ordered.    Details: X-ray of the right shoulder shows nondisplaced scapular fracture .  CT head without evidence of acute intracranial injury.  Small posterior scalp contusion. Discussion of management or test interpretation with external provider(s): On recheck, patient resting comfortably.  Vital signs reassuring.  Continues to have some pain with movement of the right shoulder.  Neurovascularly intact.  Sling applied here.  Patient agreeable to close outpatient follow-up with orthopedics.  Prefers local orthopedics.  Short course of pain medication will be provided.  Database reviewed.  Patient ambulatory with steady gait.  Return precautions were discussed.  Risk Prescription drug management.           Final Clinical Impression(s) /  ED Diagnoses Final diagnoses:  Fall, initial encounter  Closed nondisplaced fracture of body of right scapula, initial encounter  Contusion of scalp, initial encounter    Rx / DC Orders ED Discharge Orders     None         Pauline Aus, PA-C 08/07/22 Raymondo Band, MD 08/08/22 1441

## 2022-08-05 NOTE — ED Triage Notes (Signed)
Patient fell this morning and complains of right shoulder pain. Patient reportedly slipped on wet tile in bathroom. Did hit his head, but denies loss of consciousness.

## 2022-08-05 NOTE — Discharge Instructions (Signed)
Keep your right arm in a sling.  Pain medication as directed.  You may need to take a stool softener along with the pain medication as it can cause constipation.  Please call one of the orthopedic providers listed to arrange a follow-up appointment.  Return to the emergency department for any new or worsening symptoms

## 2022-08-06 ENCOUNTER — Ambulatory Visit: Payer: Medicare Other | Admitting: Physician Assistant

## 2022-08-06 DIAGNOSIS — E1122 Type 2 diabetes mellitus with diabetic chronic kidney disease: Secondary | ICD-10-CM | POA: Diagnosis not present

## 2022-08-06 DIAGNOSIS — N186 End stage renal disease: Secondary | ICD-10-CM | POA: Diagnosis not present

## 2022-08-06 DIAGNOSIS — D631 Anemia in chronic kidney disease: Secondary | ICD-10-CM | POA: Diagnosis not present

## 2022-08-06 DIAGNOSIS — Z992 Dependence on renal dialysis: Secondary | ICD-10-CM | POA: Diagnosis not present

## 2022-08-06 DIAGNOSIS — N2581 Secondary hyperparathyroidism of renal origin: Secondary | ICD-10-CM | POA: Diagnosis not present

## 2022-08-08 DIAGNOSIS — N186 End stage renal disease: Secondary | ICD-10-CM | POA: Diagnosis not present

## 2022-08-08 DIAGNOSIS — E1122 Type 2 diabetes mellitus with diabetic chronic kidney disease: Secondary | ICD-10-CM | POA: Diagnosis not present

## 2022-08-08 DIAGNOSIS — Z992 Dependence on renal dialysis: Secondary | ICD-10-CM | POA: Diagnosis not present

## 2022-08-08 DIAGNOSIS — N2581 Secondary hyperparathyroidism of renal origin: Secondary | ICD-10-CM | POA: Diagnosis not present

## 2022-08-08 DIAGNOSIS — D631 Anemia in chronic kidney disease: Secondary | ICD-10-CM | POA: Diagnosis not present

## 2022-08-11 DIAGNOSIS — N2581 Secondary hyperparathyroidism of renal origin: Secondary | ICD-10-CM | POA: Diagnosis not present

## 2022-08-11 DIAGNOSIS — Z992 Dependence on renal dialysis: Secondary | ICD-10-CM | POA: Diagnosis not present

## 2022-08-11 DIAGNOSIS — D631 Anemia in chronic kidney disease: Secondary | ICD-10-CM | POA: Diagnosis not present

## 2022-08-11 DIAGNOSIS — E1122 Type 2 diabetes mellitus with diabetic chronic kidney disease: Secondary | ICD-10-CM | POA: Diagnosis not present

## 2022-08-11 DIAGNOSIS — N186 End stage renal disease: Secondary | ICD-10-CM | POA: Diagnosis not present

## 2022-08-12 ENCOUNTER — Telehealth: Payer: Self-pay

## 2022-08-12 DIAGNOSIS — M25511 Pain in right shoulder: Secondary | ICD-10-CM | POA: Diagnosis not present

## 2022-08-12 DIAGNOSIS — M542 Cervicalgia: Secondary | ICD-10-CM | POA: Diagnosis not present

## 2022-08-12 NOTE — Telephone Encounter (Signed)
Transition Care Management Follow-up Telephone Call Date of discharge and from where: 05/05/2022 [facility] How have you been since you were released from the hospital? Patient is still sore Any questions or concerns? No  Items Reviewed: Did the pt receive and understand the discharge instructions provided? Yes  Medications obtained and verified? Yes  Other? No  Any new allergies since your discharge? No  Dietary orders reviewed? Yes Do you have support at home? Yes   Follow up appointments reviewed:  PCP Hospital f/u appt confirmed? No  Scheduled to see  on  @ . Specialist Hospital f/u appt confirmed?  Patient's spouse stated that the patient saw an Orthopedic surgeon today.  Scheduled to see  on  @ . Are transportation arrangements needed? No  If their condition worsens, is the pt aware to call PCP or go to the Emergency Dept.? Yes Was the patient provided with contact information for the PCP's office or ED? Yes Was to pt encouraged to call back with questions or concerns? Yes  Raman Featherston [facility]  [facility] Population Health Community Resource Care Guide   [email or web address]  ?? [phone]   Website: [email or web address]  [email or web address]

## 2022-08-13 DIAGNOSIS — N186 End stage renal disease: Secondary | ICD-10-CM | POA: Diagnosis not present

## 2022-08-13 DIAGNOSIS — D631 Anemia in chronic kidney disease: Secondary | ICD-10-CM | POA: Diagnosis not present

## 2022-08-13 DIAGNOSIS — N2581 Secondary hyperparathyroidism of renal origin: Secondary | ICD-10-CM | POA: Diagnosis not present

## 2022-08-13 DIAGNOSIS — E1122 Type 2 diabetes mellitus with diabetic chronic kidney disease: Secondary | ICD-10-CM | POA: Diagnosis not present

## 2022-08-13 DIAGNOSIS — Z992 Dependence on renal dialysis: Secondary | ICD-10-CM | POA: Diagnosis not present

## 2022-08-14 ENCOUNTER — Ambulatory Visit (HOSPITAL_COMMUNITY)
Admission: RE | Admit: 2022-08-14 | Discharge: 2022-08-14 | Disposition: A | Discharge status: Home / Self Care | Payer: Medicare Other | Source: Ambulatory Visit | Attending: Physician Assistant | Admitting: Physician Assistant

## 2022-08-14 ENCOUNTER — Ambulatory Visit: Payer: Medicare Other | Admitting: Physician Assistant

## 2022-08-14 DIAGNOSIS — I350 Nonrheumatic aortic (valve) stenosis: Secondary | ICD-10-CM | POA: Diagnosis not present

## 2022-08-14 DIAGNOSIS — S42101A Fracture of unspecified part of scapula, right shoulder, initial encounter for closed fracture: Secondary | ICD-10-CM | POA: Diagnosis not present

## 2022-08-14 DIAGNOSIS — Z951 Presence of aortocoronary bypass graft: Secondary | ICD-10-CM | POA: Diagnosis not present

## 2022-08-14 DIAGNOSIS — Z01818 Encounter for other preprocedural examination: Secondary | ICD-10-CM | POA: Diagnosis not present

## 2022-08-14 LAB — GLUCOSE, CAPILLARY
Glucose-Capillary: 38 mg/dL — CL (ref 70–99)
Glucose-Capillary: 60 mg/dL — ABNORMAL LOW (ref 70–99)
Glucose-Capillary: 92 mg/dL (ref 70–99)

## 2022-08-14 MED ORDER — IOHEXOL 350 MG/ML SOLN
100.0000 mL | Freq: Once | INTRAVENOUS | Status: AC | PRN
  Administered 2022-08-14: 100 mL via INTRAVENOUS

## 2022-08-14 NOTE — Progress Notes (Signed)
Pt arrived with complaints of feeling lightheaded; states he did not eat prior to scan as directed, and took 30 units of insulin PTA; CBG checked, 38; PA Matt and Amy notified; pt given juice and peanut butter per PA; CBG rechecked, 60; pt reports feeling better; pt taken to scan; when scan completed, pt taken back to room and given [place] sandwich and fig newton; CBG rechecked, 92; pt has no complaints at this time; PA notified; pt discharged

## 2022-08-14 NOTE — Progress Notes (Signed)
Notified by IR nursing that patient stated that he feels like his "sugar" is low, glucose checked and 38.  Patient has been NPO for CT cardiac scoring exam, but he took his insuline while being NPO.   As patient is minimally symptomatic and now showing s/s of hypoglycemia, will give patient some orange juice after the the CT and recheck the glucose. If it remains low, will give IV D5 and recheck the glucose.    Lynann Bologna Demetris Meinhardt PA-C 08/14/2022 10:24 AM

## 2022-08-15 DIAGNOSIS — D631 Anemia in chronic kidney disease: Secondary | ICD-10-CM | POA: Diagnosis not present

## 2022-08-15 DIAGNOSIS — N186 End stage renal disease: Secondary | ICD-10-CM | POA: Diagnosis not present

## 2022-08-15 DIAGNOSIS — E1122 Type 2 diabetes mellitus with diabetic chronic kidney disease: Secondary | ICD-10-CM | POA: Diagnosis not present

## 2022-08-15 DIAGNOSIS — N2581 Secondary hyperparathyroidism of renal origin: Secondary | ICD-10-CM | POA: Diagnosis not present

## 2022-08-15 DIAGNOSIS — Z992 Dependence on renal dialysis: Secondary | ICD-10-CM | POA: Diagnosis not present

## 2022-08-17 ENCOUNTER — Ambulatory Visit: Payer: Medicare Other | Admitting: Physician Assistant

## 2022-08-17 DIAGNOSIS — I129 Hypertensive chronic kidney disease with stage 1 through stage 4 chronic kidney disease, or unspecified chronic kidney disease: Secondary | ICD-10-CM | POA: Diagnosis not present

## 2022-08-17 DIAGNOSIS — N186 End stage renal disease: Secondary | ICD-10-CM | POA: Diagnosis not present

## 2022-08-17 DIAGNOSIS — Z992 Dependence on renal dialysis: Secondary | ICD-10-CM | POA: Diagnosis not present

## 2022-08-18 DIAGNOSIS — E1122 Type 2 diabetes mellitus with diabetic chronic kidney disease: Secondary | ICD-10-CM | POA: Diagnosis not present

## 2022-08-18 DIAGNOSIS — N2581 Secondary hyperparathyroidism of renal origin: Secondary | ICD-10-CM | POA: Diagnosis not present

## 2022-08-18 DIAGNOSIS — D631 Anemia in chronic kidney disease: Secondary | ICD-10-CM | POA: Diagnosis not present

## 2022-08-18 DIAGNOSIS — Z992 Dependence on renal dialysis: Secondary | ICD-10-CM | POA: Diagnosis not present

## 2022-08-18 DIAGNOSIS — N186 End stage renal disease: Secondary | ICD-10-CM | POA: Diagnosis not present

## 2022-08-19 ENCOUNTER — Institutional Professional Consult (permissible substitution) (INDEPENDENT_AMBULATORY_CARE_PROVIDER_SITE_OTHER): Payer: Medicare Other | Admitting: Surgery

## 2022-08-19 ENCOUNTER — Encounter: Payer: Self-pay | Admitting: Surgery

## 2022-08-19 VITALS — BP 80/50 | HR 90 | Resp 20 | Ht 71.0 in | Wt 220.0 lb

## 2022-08-19 DIAGNOSIS — I350 Nonrheumatic aortic (valve) stenosis: Secondary | ICD-10-CM | POA: Diagnosis not present

## 2022-08-19 NOTE — Progress Notes (Signed)
Patient ID: Bradley Soto, male   DOB: 07/13/1940, 82 y.o.   MRN: [id number]  [facility]VE CLINIC       [address]       [place]ley Soto [address]             [phone]          CARDIOTHORACIC SURGERY CONSULTATION REPORT  PCP is Alysia Penna, MD Referring Provider is Tonny Bollman, MD Primary Cardiologist is Kristeen Miss, MD  Reason for consultation: Severe aortic stenosis  HPI:  The patient is an 82 year old gentleman with a history of hypertension, hyperlipidemia, type 2 diabetes, end-stage renal disease on hemodialysis Tuesday Thursday and Saturday, atrial fibrillation, OSA on CPAP, and coronary artery disease status post multivessel CABG in 2020 by Dr. Vickey Sages with a LIMA to the LAD, SVG to the ramus and OM 3, and SVG to the diagonal branch.  The distal right coronary artery branches were too small to bypass.  He has developed progressive exertional fatigue and shortness of breath.  Dimensionless index was 0.25 with a stroke-volume index of 30.  Left ventricular ejection fraction was 55 to 60% with grade 2 diastolic dysfunction.  He is here today with his wife.  He has been on dialysis for 3 years and has recently not been tolerating it very well.  His dialysis sessions have been cut short due to hypotension.  He has worsening exertional fatigue and shortness of breath that seems to be worse on dialysis days.  He is walking around his house with a walker but gets short of breath doing that.  He has had some lightheadedness after dialysis due to low blood pressure but denies any syncope.  He reports some discomfort in his chest.  He denies any lower extremity edema.  His wife feels like he has been getting worse over the past few weeks.  He had a recent fall and suffered a comminuted fracture of the right scapula which she said seems to be getting better.  Past Medical History:  Diagnosis Date   Anxiety    Atrial  fibrillation (HCC)    Bell's palsy    left side of face droops   CAD (coronary artery disease)    s/p stent placement   CKD (chronic kidney disease), stage III (HCC)    [place]- [facility]- [facility]   Colon polyps    Diabetes mellitus without complication (HCC)    Gout    History of blood transfusion    History of kidney stones    Passes   HLD (hyperlipidemia)    HTN (hypertension)    Insomnia    MI (myocardial infarction) (HCC) 1998   Neuropathy    NSTEMI (non-ST elevated myocardial infarction) (HCC)    s/p PCI in early 2000s   OSA on CPAP    wears CPAP   Presence of permanent cardiac pacemaker    SSS (sick sinus syndrome) (HCC)    Type II or unspecified type diabetes mellitus without mention of complication, not stated as uncontrolled    Wears glasses    reading    Wears partial dentures     Past Surgical History:  Procedure Laterality Date   APPENDECTOMY     AV FISTULA PLACEMENT Right 07/28/2019   Procedure: RIGHT ARM Brachiocephalic ARTERIOVENOUS (AV) FISTULA CREATION;  Surgeon: Nada Libman, MD;  Location: [facility] OR;  Service: Vascular;  Laterality: Right;   CATARACT EXTRACTION     CLIPPING OF ATRIAL  APPENDAGE  12/05/2018   Procedure: Clipping Of Atrial Appendage;  Surgeon: Linden Dolin, MD;  Location: [facility] OR;  Service: Open Heart Surgery;;   CORONARY ANGIOPLASTY WITH STENT PLACEMENT     x4   CORONARY ARTERY BYPASS GRAFT N/A 12/05/2018   Procedure: CORONARY ARTERY BYPASS GRAFTING (CABG) X FOUR USING LEFT INTERNAL MAMMARY ARTERY AND RIGHT SAPHENOUS VEIN GRAFTS;  Surgeon: Linden Dolin, MD;  Location: [facility] OR;  Service: Open Heart Surgery;  Laterality: N/A;   HERNIA REPAIR     ventral hernia repair with mesh--revision also was required   INSERT / REPLACE / REMOVE PACEMAKER  05/10/2000   pacemaker implanted in [place]]   IR THORACENTESIS ASP PLEURAL SPACE W/IMG GUIDE  12/21/2018   IR THORACENTESIS ASP PLEURAL SPACE W/IMG GUIDE  12/30/2018   LEFT HEART CATH AND  CORONARY ANGIOGRAPHY N/A 11/30/2018   Procedure: LEFT HEART CATH AND CORONARY ANGIOGRAPHY;  Surgeon: Kathleene Hazel, MD;  Location: [facility] INVASIVE CV LAB;  Service: Cardiovascular;  Laterality: N/A;   MAZE N/A 12/05/2018   Procedure: MAZE;  Surgeon: Linden Dolin, MD;  Location: [facility];  Service: Open Heart Surgery;  Laterality: N/A;   PACEMAKER GENERATOR CHANGE  09/03/09   MDT Adapta generator change in [place]   PARTIAL COLON REMOVAL  2/2   polyp that could not be removed on colonoscopy   PPM GENERATOR CHANGEOUT N/A 06/01/2019   Procedure: PPM GENERATOR CHANGEOUT;  Surgeon: Hillis Range, MD;  Location: [facility] INVAS[facility] CV [facility];  Service: Cardiovascular;  Laterality: N/A;   REVISON OF ARTERIOVENOUS FISTULA Right 01/24/2020   Procedure: BANDING OF RIGHT ARM ARTERIOVENOUS FISTULA;  Surgeon: Nada Libman, MD;  Location: [facility];  Service: Vascular;  Laterality: Right;   RIGHT/LEFT HEART CATH AND CORONARY/GRAFT ANGIOGRAPHY N/A 07/31/2022   Procedure: RIGHT/LEFT HEART CATH AND CORONARY/GRAFT ANGIOGRAPHY;  Surgeon: Marykay Lex, MD;  Location: [facility] INVASIVE CV LAB;  Service: Cardiovascular;  Laterality: N/A;   TEE WITHOUT CARDIOVERSION N/A 12/05/2018   Procedure: TRANSESOPHAGEAL ECHOCARDIOGRAM (TEE);  Surgeon: Linden Dolin, MD;  Location: [facility] OR;  Service: Open Heart Surgery;  Laterality: N/A;    Family History  Problem Relation Age of Onset   Heart attack Father 53   Heart disease Father    Breast cancer Sister    Pulmonary embolism Sister    Deep vein thrombosis Sister     Social History   Socioeconomic History   Marital status: Married    Spouse name: Not on file   Number of children: Not on file   Years of education: Not on file   Highest education level: Not on file  Occupational History   Not on file  Tobacco Use   Smoking status: Former    Types: Cigarettes   Smokeless tobacco: Never   Tobacco comments:    " many years ago"  Vaping Use   Vaping Use: Never used   Substance and Sexual Activity   Alcohol use: No   Drug use: No   Sexual activity: Not on file  Other Topics Concern   Not on file  Social History Narrative   Lives with wife   Caffeine coffee 2 daily   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   Social Determinants of Health   Financial Resource Strain: Not on file  Food Insecurity: Not on file  Transportation Needs: Not on file  Physical Activity: Not on file  Stress: Not on file  Social Connections: Not on file  Intimate Partner  Violence: Not on file    Prior to Admission medications   Medication Sig Start Date End Date Taking? Authorizing Provider  allopurinol (ZYLOPRIM) 100 MG tablet Take 1 tablet (100 mg total) by mouth daily. 11/03/19  Yes [name], Earl Lites, MD  aspirin EC 81 MG tablet Take 81 mg by mouth in the morning.   Yes [provider]  B Complex-C-Zn-Folic Acid (DIALYVITE 800 WITH ZINC) 0.8 MG TABS Take 1 tablet by mouth at bedtime. 11/08/19  Yes [provider]  CALCITRIOL PO Take 4-5 tablets by mouth See admin instructions. Patient takes 4-5 calcitrol vitamin d tablets (1.75mg  per patient's spouse) during dialysis (T, TH, Sat).   Yes [provider]  Copper 5 MG TABS Take 1 tablet (5 mg total) by mouth daily. Patient taking differently: Take 10 mg by mouth in the morning. 12/15/21  Yes Pennington, Rushie Goltz, PA-C  [employer] VITAMIN B12 1000 MCG tablet TAKE 1 TABLET BY MOUTH EVERY DAY 03/26/22  Yes Pennington, Rebekah M, PA-C  diltiazem (CARDIZEM SR) 60 MG 12 hr capsule Take 1 capsule (60 mg total) by mouth as needed (palpitations). 05/22/22  Yes Conte, Tessa N, PA-C  gabapentin (NEURONTIN) 100 MG capsule Take 1 capsule (100 mg total) by mouth 3 (three) times daily. Patient taking differently: Take 300 mg by mouth in the morning. 09/24/21  Yes Maeola Harman, MD  iron sucrose in sodium chloride 0.9 % 100 mL once a week. 08/26/21 09/22/22 Yes [provider]  Methoxy PEG-Epoetin  Beta (MIRCERA IJ) Mircera 05/23/22 05/22/23 Yes [provider]  Methoxy PEG-Epoetin Beta (MIRCERA IJ) Mircera 06/09/22 06/08/23 Yes [provider]  midodrine (PROAMATINE) 5 MG tablet Take by mouth. Per patient taking Tuesdays, Thursdays and Saturdays before dialysis 06/25/22  Yes [provider]  nitroGLYCERIN (NITROSTAT) 0.4 MG SL tablet Place 1 tablet (0.4 mg total) under the tongue every 5 (five) minutes as needed for chest pain. 05/22/22  Yes Conte, Tessa N, PA-C  NOVOLOG MIX 70/30 FLEXPEN (70-30) 100 UNIT/ML FlexPen Inject 30 Units into the skin in the morning and at bedtime. 02/26/16  Yes [provider]  oxyCODONE-acetaminophen (PERCOCET/ROXICET) 5-325 MG tablet Take 1 tablet by mouth every 6 (six) hours as needed. 08/05/22  Yes Triplett, Tammy, PA-C  sevelamer carbonate (RENVELA) 800 MG tablet Take 1 tablet (800 mg total) by mouth 3 (three) times daily with meals. Patient taking differently: Take 2,400 mg by mouth 3 (three) times daily with meals. 11/03/19  Yes [name], Earl Lites, MD    Current Outpatient Medications  Medication Sig Dispense Refill   allopurinol (ZYLOPRIM) 100 MG tablet Take 1 tablet (100 mg total) by mouth daily. 30 tablet 3   aspirin EC 81 MG tablet Take 81 mg by mouth in the morning.     B Complex-C-Zn-Folic Acid (DIALYVITE 800 WITH ZINC) 0.8 MG TABS Take 1 tablet by mouth at bedtime.     CALCITRIOL PO Take 4-5 tablets by mouth See admin instructions. Patient takes 4-5 calcitrol vitamin d tablets (1.75mg  per patient's spouse) during dialysis (T, TH, Sat).     Copper 5 MG TABS Take 1 tablet (5 mg total) by mouth daily. (Patient taking differently: Take 10 mg by mouth in the morning.) 90 tablet 0   CVS VITAMIN B12 1000 MCG tablet TAKE 1 TABLET BY MOUTH EVERY DAY 90 tablet 0   diltiazem (CARDIZEM SR) 60 MG 12 hr capsule Take 1 capsule (60 mg total) by mouth as needed (palpitations). 30 capsule 3  gabapentin (NEURONTIN) 100 MG capsule Take 1  capsule (100 mg total) by mouth 3 (three) times daily. (Patient taking differently: Take 300 mg by mouth in the morning.) 90 capsule 3   iron sucrose in sodium chloride 0.9 % 100 mL once a week.     Methoxy PEG-Epoetin Beta (MIRCERA IJ) Mircera     Methoxy PEG-Epoetin Beta (MIRCERA IJ) Mircera     midodrine (PROAMATINE) 5 MG tablet Take by mouth. Per patient taking Tuesdays, Thursdays and Saturdays before dialysis     nitroGLYCERIN (NITROSTAT) 0.4 MG SL tablet Place 1 tablet (0.4 mg total) under the tongue every 5 (five) minutes as needed for chest pain. 25 tablet 3   NOVOLOG MIX 70/30 FLEXPEN (70-30) 100 UNIT/ML FlexPen Inject 30 Units into the skin in the morning and at bedtime.  3   oxyCODONE-acetaminophen (PERCOCET/ROXICET) 5-325 MG tablet Take 1 tablet by mouth every 6 (six) hours as needed. 10 tablet 0   sevelamer carbonate (RENVELA) 800 MG tablet Take 1 tablet (800 mg total) by mouth 3 (three) times daily with meals. (Patient taking differently: Take 2,400 mg by mouth 3 (three) times daily with meals.) 90 tablet 3   No current facility-administered medications for this visit.    Allergies  Allergen Reactions   Shellfish Allergy Rash      Review of Systems:   General:  normal appetite, + decreased energy, no weight gain, no weight loss, no fever  Cardiac:  + chest pain with exertion, no chest pain at rest, +SOB with mild exertion, no resting SOB, no PND, no orthopnea, no palpitations, no arrhythmia, + atrial fibrillation, no LE edema, + dizzy spells, no syncope  Respiratory:  + shortness of breath, no home oxygen, no productive cough, no dry cough, no bronchitis, no wheezing, no hemoptysis, n asthma, ono pain with inspiration or cough, + sleep apnea, + CPAP at night  GI:   + difficulty swallowing, no reflux, no frequent heartburn, no hiatal hernia, no abdominal pain, no constipation, no diarrhea, no hematochezia, no hematemesis, no melena  GU:   no dysuria,  no frequency, no urinary  tract infection, no hematuria, no enlarged prostate, no kidney stones, +end stage kidney disease on HD  Vascular:  no pain suggestive of claudication, + pain in feet, no leg cramps, no varicose veins, no DVT, no non-healing foot ulcer  Neuro:   no stroke, no TIA's, no seizures, no headaches, no temporary blindness one eye,  no slurred speech, + peripheral neuropathy, no chronic pain, + instability of gait, no memory/cognitive dysfunction  Musculoskeletal: + arthritis, no joint swelling, no myalgias, + difficulty walking, + decreased mobility   Skin:   no rash, no itching, no skin infections, no pressure sores or ulcerations  Psych:   no anxiety, no depression, no nervousness, no unusual recent stress  Eyes:   no blurry vision, no floaters, no recent vision changes, no  glasses or contacts  ENT:   no hearing loss, no loose or painful teeth, no dentures, last saw dentist May 2024  Hematologic:  + easy bruising, no abnormal bleeding, no clotting disorder, no frequent epistaxis  Endocrine:  + diabetes, occasionally checks CBG's at home     Physical Exam:   BP (!) 80/50   Pulse 90   Resp 20   Ht 5\' 11"  (1.803 m)   Wt 220 lb (99.8 kg)   SpO2 95% Comment: RA  BMI 30.68 kg/m   General:  Chronically ill-appearing  HEENT:  Unremarkable, NCAT,  PERLA, EOMI  Neck:   no JVD, no bruits, no adenopathy   Chest:   clear to auscultation, symmetrical breath sounds, no wheezes, no rhonchi   CV:   RRR, 3/6 systolic murmur RSB, no diastolic murmur  Abdomen:  soft, non-tender, no masses   Extremities:  warm, well-perfused, pedal pulses palpable, no lower extremity edema  Rectal/GU  Deferred  Neuro:   Grossly non-focal and symmetrical throughout  Skin:   Clean and dry, no rashes, no breakdown  Diagnostic Tests:  ECHOCARDIOGRAM REPORT       Patient Name:   Bradley Soto Date of Exam: 06/22/2022  Medical Rec #:  [id number]      Height:       71.0 in  Accession #:    [id number]     Weight:       215.8 lb   Date of Birth:  07/28/1940       BSA:          2.178 m  Patient Age:    81 years       BP:           131/72 mmHg  Patient Gender: M              HR:           87 bpm.  Exam Location:  [address]   Procedure: 2D Echo, Cardiac Doppler, Color Doppler and Strain Analysis   Indications:    R06.00 SOB    History:        Patient has prior history of Echocardiogram examinations,  most                 recent 12/01/2018. Previous Myocardial Infarction, Prior  CABG                 and Pacemaker, CKD, Arrythmias:Atrial Fibrillation; Risk                  Factors:Sleep Apnea and Diabetes.    Sonographer:    Clearence Ped RCS  Referring Phys: 52 TESSA N CONTE   IMPRESSIONS     1. Left ventricular ejection fraction, by estimation, is 55 to 60%. The  left ventricle has normal function. The left ventricle has no regional  wall motion abnormalities. There is mild asymmetric left ventricular  hypertrophy of the septal segment. Left  ventricular diastolic parameters are consistent with Grade II diastolic  dysfunction (pseudonormalization). Elevated left ventricular end-diastolic  pressure. The average left ventricular global longitudinal strain is -15.3  %. The global longitudinal  strain is abnormal.   2. Right ventricular systolic function is normal. The right ventricular  size is mildly enlarged. There is normal pulmonary artery systolic  pressure.   3. The mitral valve is normal in structure. Trivial mitral valve  regurgitation. No evidence of mitral stenosis.   4. The aortic valve is calcified. Aortic valve regurgitation is trivial.  Moderate to severe aortic valve stenosis. Aortic regurgitation PHT  measures 589 msec. Aortic valve area, by VTI measures 0.87 cm. Aortic  valve mean gradient measures 27.0 mmHg.  Aortic valve Vmax measures 3.44 m/s.   5. There is mild dilatation of the ascending aorta, measuring 38 mm.   6. The inferior vena cava is normal in size with greater than 50%   respiratory variability, suggesting right atrial pressure of 3 mmHg.   FINDINGS   Left Ventricle: Left ventricular ejection fraction, by estimation, is 55  to 60%. The left ventricle has  normal function. The left ventricle has no  regional wall motion abnormalities. The average left ventricular global  longitudinal strain is -15.3 %.  The global longitudinal strain is abnormal. The left ventricular internal  cavity size was normal in size. There is mild asymmetric left ventricular  hypertrophy of the septal segment. Left ventricular diastolic parameters  are consistent with Grade II  diastolic dysfunction (pseudonormalization). Elevated left ventricular  end-diastolic pressure.   Right Ventricle: The right ventricular size is mildly enlarged. No  increase in right ventricular wall thickness. Right ventricular systolic  function is normal. There is normal pulmonary artery systolic pressure.  The tricuspid regurgitant velocity is 1.65   m/s, and with an assumed right atrial pressure of 3 mmHg, the estimated  right ventricular systolic pressure is 13.9 mmHg.   Left Atrium: Left atrial size was normal in size.   Right Atrium: Right atrial size was normal in size.   Pericardium: There is no evidence of pericardial effusion. Presence of  epicardial fat layer.   Mitral Valve: The mitral valve is normal in structure. Trivial mitral  valve regurgitation. No evidence of mitral valve stenosis.   Tricuspid Valve: The tricuspid valve is normal in structure. Tricuspid  valve regurgitation is not demonstrated. No evidence of tricuspid  stenosis.   Aortic Valve: The aortic valve is calcified. Aortic valve regurgitation is  trivial. Aortic regurgitation PHT measures 589 msec. Moderate to severe  aortic stenosis is present. Aortic valve mean gradient measures 27.0 mmHg.  Aortic valve peak gradient  measures 47.3 mmHg. Aortic valve area, by VTI measures 0.87 cm.   Pulmonic Valve: The  pulmonic valve was normal in structure. Pulmonic valve  regurgitation is not visualized. No evidence of pulmonic stenosis.   Aorta: There is mild dilatation of the ascending aorta, measuring 38 mm.   Venous: The inferior vena cava is normal in size with greater than 50%  respiratory variability, suggesting right atrial pressure of 3 mmHg.   IAS/Shunts: No atrial level shunt detected by color flow Doppler.     LEFT VENTRICLE  PLAX 2D  LVIDd:         5.00 cm   Diastology  LVIDs:         3.60 cm   LV e' medial:    5.22 cm/s  LV PW:         0.80 cm   LV E/e' medial:  20.7  LV IVS:        1.30 cm   LV e' lateral:   6.64 cm/s  LVOT diam:     2.10 cm   LV E/e' lateral: 16.3  LV SV:         66  LV SV Index:   30        2D Longitudinal Strain  LVOT Area:     3.46 cm  2D Strain GLS (A2C):   -13.5 %                           2D Strain GLS (A3C):   -16.9 %                           2D Strain GLS (A4C):   -15.4 %                           2D Strain GLS Avg:     -15.3 %  RIGHT VENTRICLE  RV Basal diam:  4.20 cm  RV Mid diam:    2.90 cm  RV S prime:     9.79 cm/s  TAPSE (M-mode): 1.9 cm  RVSP:           13.9 mmHg   LEFT ATRIUM             Index        RIGHT ATRIUM           Index  LA diam:        4.30 cm 1.97 cm/m   RA Pressure: 3.00 mmHg  LA Vol (A2C):   76.2 ml 34.99 ml/m  RA Area:     15.70 cm  LA Vol (A4C):   63.7 ml 29.25 ml/m  RA Volume:   38.70 ml  17.77 ml/m  LA Biplane Vol: 70.3 ml 32.28 ml/m   AORTIC VALVE  AV Area (Vmax):    0.87 cm  AV Area (Vmean):   0.80 cm  AV Area (VTI):     0.87 cm  AV Vmax:           344.00 cm/s  AV Vmean:          246.000 cm/s  AV VTI:            0.761 m  AV Peak Grad:      47.3 mmHg  AV Mean Grad:      27.0 mmHg  LVOT Vmax:         86.20 cm/s  LVOT Vmean:        56.800 cm/s  LVOT VTI:          0.191 m  LVOT/AV VTI ratio: 0.25  AI PHT:            589 msec    AORTA  Ao Root diam: 3.40 cm  Ao Asc diam:  3.80 cm   MITRAL VALVE                 TRICUSPID VALVE  MV Area (PHT):              TR Peak grad:   10.9 mmHg  MV Decel Time:              TR Vmax:        165.00 cm/s  MV E velocity: 108.00 cm/s  Estimated RAP:  3.00 mmHg  MV A velocity: 108.00 cm/s  RVSP:           13.9 mmHg  MV E/A ratio:  1.00                              SHUNTS                              Systemic VTI:  0.19 m                              Systemic Diam: 2.10 cm   Lavona Mound Tobb DO  Electronically signed by Thomasene Ripple DO  Signature Date/Time: 06/22/2022/3:40:21 PM        Final     Physicians  Panel Physicians Referring Physician Case Authorizing Physician  Marykay Lex, MD (Primary)     Procedures  RIGHT/LEFT HEART CATH AND CORONARY/GRAFT ANGIOGRAPHY   Conclusion      Prox  LAD to Mid LAD lesion is 85% stenosed between SP1 & 2. After Sp2, Mid LAD lesion is 100% stenosed with 100% stenosed side branch in 2nd Diag.   Ostial-Proximal Ramus Stent is 100% stenosed.   Prox Cx to Mid Cx lesion is 95% stenosed (involving previously placed DES STENT) with 85% stenosed side branch in LPAV.   Previously placed 1st Mrg Ostial-proximal DES STENT is 99% stenosed.   Dist Cx l(after 1st Mrg) preciously placed DES STENT is 100% stenosed with 100% stenosed side branch in 2nd Mrg.   Ost RCA to Mid RCA lesion is 100% stenosed.   --------------4/4 PATENT GRAFTS-----------------------------   SVG graft was visualized by angiography and is normal in caliber.  The graft exhibits no disease. The flow is not reversed.   LIMA graft was visualized by angiography and is normal in caliber. The graft exhibits no disease. The flow is not reversed.   Seq SVG- Ramus-3rdMrg graft was visualized by angiography and is normal in caliber. The graft exhibits no disease. The flow is not reversed.   -----------------HEMODYMAMICS-------------------------   Hemodynamic findings consistent with mild pulmonary hypertension. LV end diastolic pressure is normal.   There is severe  aortic valve stenosis - > mean gradient estimated 46 mmHg.  Prominent V wave on PCWP waveform.     POST-CATH DIAGNOSES Severe native vessel CAD:  85% proximal to mid LAD after her SP 1 and D1, then 100 and occluded after SP2 99% proximal to mid LCx prior to stent without occlusion of the distal LCx after OM1 and 99% OM1 100% CTO of RI stent 100% proximal RCA occlusion with extensive small caliber left-to-right collaterals filling the distal branch. 4 of 4 grafts patent: Free LIMA-LAD (from SVG-D2 hood), SVG-D2, SeqSVG-RI-OM 2 Difficult to fully assess valve gradient as the patient went into a paced rhythm with the pullback across the aortic valve estimated MEAN GRADIENT 46 MMHG-SEVERE AORTIC STENOSIS Right Heart Pressures indicate Mild to Moderate Pulmonary Hypertension: PAP-mean 4615-27 mmHg; PCWP mean of 15 with a V wave up to 28 mmHg. LVEDP 14 mmHg.;  Cardiac Output-Index: (Fick) 7.0-3.19; (Thermodilution) 1.61-096  Successful Mynx closure of the RFA     RECOMMENDATIONS Discharge home from short stay with plans to continue follow-up with heart valve clinic to complete TAVR workup. Small caliber RCA is 100% occluded-not a PCI target       Dreshon Proffit Lemma, MD     Recommendations  Antiplatelet/Anticoag Recommend Aspirin 81mg  daily for moderate CAD.  Discharge Date In the absence of any other complications or medical issues, we expect the patient to be ready for discharge from a cath perspective on 07/31/2022.   Indications  Hx of CABG [Z95.1 (ICD-10-CM)]  Coronary artery disease involving native coronary artery of native heart with angina pectoris (HCC) [I25.119 (ICD-10-CM)]  Severe aortic stenosis by prior echocardiogram [I35.0 (ICD-10-CM)]   Clinical Presentation  CHF/Shock Congestive heart failure not present. No shock present.   Procedural Details  Technical Details PCP:  Alysia Penna, MD              [facility] Cardiologist:  Kristeen Miss, MD    TAVR  Team: Dr. Excell Seltzer, Dr. Laneta Simmers, Cline Crock, PA  Bradley Soto is a 82 y.o. male presenting for evaluation of severe aortic stenosis, referred by Jari Favre, PA, and Dr. Elease Hashimoto.  The patient has a complex medical history.  He has undergone coronary stenting in the past.  In 2020 he began to develop progressive chest discomfort and ultimately  underwent cardiac catheterization and was found to have severe multivessel coronary artery disease.  He was treated with multivessel CABG in 2020 with a LIMA to LAD graft, saphenous vein graft to ramus intermedius and OM 3, and saphenous vein graft to diagonal.  The distal right coronary artery branches were too small for bypass grafting.  The patient has gone on to develop progressive kidney disease and now end-stage renal disease.  He has reported progressive shortness of breath that his office visits with Dr. Elease Hashimoto.  Significant comorbidities have been felt to contribute including anemia of chronic disease.  He has not been anticoagulated for paroxysmal atrial fibrillation because of his chronic anemia and comorbid conditions.  He recently underwent an echocardiogram which demonstrated progressive aortic stenosis now in the moderate to severe range and he was referred to Dr. Excell Seltzer for evaluation for possible TAVR.  He now presents for right neph heart cath as part of pre-TAVR workup.  Time Out: Verified patient identification, verified procedure, site/side was marked, verified correct patient position, special equipment/implants available, medications/allergies/relevent history reviewed, required imaging and test results available. Performed.  Access:  * Direct ultrasound guidance used for both arterial & venous access.  Permanent image obtained and placed on chart. * RIGHT Common Femoral Artery: 5 Fr Sheath - fluoroscopically guided modified Seldinger Technique  * RIGHT Common Femoral Vein: 7 Fr Sheath - Seldinger Technique..  Right Heart Catheterization: 7  Fr Theone Murdoch catheter advanced under fluoroscopy with balloon inflated to the RA, RV, then PCWP-PA for hemodynamic measurement.  * Simultaneous FA & PA blood gases checked for SaO2% to calculate FICK CO/CI  * Thermodilution Injections performed to calculate CO/CI  * Catheter removed completely out of the body with balloon deflated.  Left Heart Catheterization: 5Fr Catheters advanced or exchanged over a J-wire under direct fluoroscopic guidance into the ascending aorta; JL4 catheter advanced first.  * LV Hemodynamics (no LV Gram): JR4 catheter crossed the aortic valve with J-wire. -->  The initial crossing of the catheter led to patient going into a narrow complex tachycardia followed by paced rhythm, he then broke into a regular rhythm.  Unfortunately I did pull the catheter back because of the potential concern for tachycardia.  On recross and the wire the patient remained in sinus rhythm until the catheter was pulled back across the valve and then he went into a paced rhythm. * Left Coronary Artery Cineangiography: JL 4 catheter  * Right Coronary Artery, SVG-RCA & SVG-OM Cineangiography: JR4 catheter  * SeqSVG-RI-OM3 & SVG-D2/Free LIMA-LAD Cineangiography: AL1 catheter  The JR4 catheter was redirected into the subclavian artery, and there was no obvious LIMA.  On detailed reading of the CABG report, it was apparent that the LIMA was taken down as a free LIMA and sewn to the SVG-diagonal hood.   Upon completion of Angiogaphy, the catheter was removed completely out of the body over a wire, without complication.  Femoral arterial sheath was removed in the Cath Lab with Mynx closure device deployed and brief manual hemostasis. Femoral venous sheath was removed in the Cath Lab with manual pressure held for hemostasis.    MEDICATIONS * SQ Lidocaine 20 mL mL * Isovue Contrast: Total 90mL Estimated blood loss <50 mL.   During this procedure medications were administered to achieve and maintain  moderate conscious sedation while the patient's heart rate, blood pressure, and oxygen saturation were continuously monitored and I was present face-to-face 100% of this time.   Medications (Filter: Administrations occurring from [phone]  to 0903 on 07/31/22)  important  Continuous medications are totaled by the amount administered until 07/31/22 0903.   lidocaine (PF) (XYLOCAINE) 1 % injection (mL)  Total volume: 10 mL Date/Time Rate/Dose/Volume Action   07/31/22 0755 10 mL Given   Heparin (Porcine) in NaCl 1000-0.9 UT/500ML-% SOLN (mL)  Total volume: 1,000 mL Date/Time Rate/Dose/Volume Action   07/31/22 0818 500 mL Given   0818 500 mL Given   midazolam (VERSED) injection (mg)  Total dose: 1 mg Date/Time Rate/Dose/Volume Action   07/31/22 0818 1 mg Given   iohexol (OMNIPAQUE) 350 MG/ML injection (mL)  Total volume: 90 mL Date/Time Rate/Dose/Volume Action   07/31/22 0847 90 mL Given    Sedation Time  Sedation Time Physician-1: 25 minutes 15 seconds Contrast     Administrations occurring from 0725 to 0903 on 07/31/22:  Medication Name Total Dose  iohexol (OMNIPAQUE) 350 MG/ML injection 90 mL   Radiation/Fluoro  Fluoro time: 13.3 (min) DAP: 28414 (mGycm2) Cumulative Air Kerma: 484 (mGy) Complications  Complications documented before study signed (07/31/2022  9:50 AM)   No complications were associated with this study.  Documented by Erenest Rasher, RN - 07/31/2022  8:47 AM     Coronary Findings  Diagnostic Dominance: Co-dominant Left Main  Vessel was injected. Vessel is normal in caliber.    Left Anterior Descending  Vessel is moderate in size.  Prox LAD to Mid LAD lesion is 85% stenosed.  Mid LAD lesion is 100% stenosed with 100% stenosed side branch in 2nd Diag.    First Diagonal Branch  Vessel is small in size.    Second Diagonal Branch  Vessel is moderate in size.    Third Diagonal Branch  Vessel is small in size.    Ramus Intermedius  Ramus lesion is  100% stenosed. The lesion is chronically occluded. The lesion was previously treated using a drug eluting stent over 2 years ago. Previously placed stent displays restenosis.    Left Circumflex  Vessel is normal in caliber.  Prox Cx to Mid Cx lesion is 95% stenosed with 85% stenosed side branch in LPAV. The lesion was previously treated using a drug eluting stent over 2 years ago. Proximal edge of previous stent Previously placed stent displays restenosis.  Dist Cx lesion is 100% stenosed with 100% stenosed side branch in 2nd Mrg. The lesion is chronically occluded. The lesion was previously treated using a drug eluting stent over 2 years ago. Previously placed stent displays restenosis.    First Obtuse Marginal Branch  Vessel is small in size.  1st Mrg lesion is 99% stenosed. The lesion was previously treated using a drug eluting stent over 2 years ago. Previously placed stent displays restenosis.    First Left Posterolateral Branch  Vessel is small in size.    Second Left Posterolateral Branch  Vessel is small in size.    Right Coronary Artery  Vessel was injected. Vessel is small. There is severe diffuse disease throughout the vessel.  Ost RCA to Mid RCA lesion is 100% stenosed. The lesion is chronically occluded.    Right Posterior Descending Artery  Vessel is small in size. There is severe disease in the vessel.  Collaterals  RPDA filled by collaterals from 1st Sept.    Collaterals  RPDA filled by collaterals from 2nd Sept.      First Right Posterolateral Branch  There is moderate disease in the vessel.    Saphenous Graft To 2nd Diag  SVG graft was visualized by angiography  and is normal in caliber. The graft exhibits no disease. The flow is not reversed. SVG-D2    LIMA Y-graft Branch To Mid LAD  LIMA graft was visualized by angiography and is normal in caliber. The flow is not reversed. LIMA-LAD    Sequential Jump Graft Graft To Ramus, 2nd Mrg  Seq SVG- Ramus-3rdMrg  graft was visualized by angiography and is normal in caliber. The graft exhibits no disease. The flow is not reversed.    Intervention   No interventions have been documented.   Right Heart  Right Heart Pressures Hemodynamic findings consistent with mild pulmonary hypertension. PAP-mean 46/15-27 mmHg PCWP 15 mmHg  Mean -> V wave 28 mmHg LV EDP is normal. LV P-EDP 170/4-14 mmHg. AO P-mean 112/49-73 mmHg Ao sat 94%, PA sat 63%. Cardiac Output / Index: (Fick) 7/3.19; (Thermal) 6.9/3.17  Right Atrium Right atrial pressure is normal. RAP mean 4-7 mmHg  Right Ventricle RVP-EDP 43/0-4 mmHg   Wall Motion  No LV gram         Left Heart  Aortic Valve There is severe aortic valve stenosis. The aortic valve is calcified. Peak to peak gradient 61 mmHg, mean gradient 46.9.   Coronary Diagrams  Diagnostic Dominance: Co-dominant  Intervention   Implants   Vascular Products  Closure Mynx Control 74f - [id number] - Implanted  Inventory item: CLOSURE [facility] CONTROL 40F Model/Cat number: [id number]  Manufacturer: [employer] Lot number: [id number]  Device identifier: [id number] Device identifier type: GS1  GUDID Information  Request status Successful    Brand name: MYNX CONTROL Version/Model: MW1027  Company name: [employer] MRI safety info as of 07/31/22: MR Safe  Contains dry or latex rubber: No    GMDN P.T. name: Wound hydrogel dressing, non-antimicrobial    As of 07/31/2022  Status: Implanted       Syngo Images   Show images for CARDIAC CATHETERIZATION Images on Long Term Storage   Show images for Bradley Soto Link to Procedure Log  Procedure Log    Hemo Data  Flowsheet Row Most Recent Value  Fick Cardiac Output 7 L/min  Fick Cardiac Output Index 3.19 (L/min)/BSA  Thermal Cardiac Output 6.97 L/min  Thermal Cardiac Output Index 3.17 (L/min)/BSA  Aortic Mean Gradient 46.88 mmHg  Aortic Peak Gradient 61.1 mmHg  Aortic Valve Area 0.87  Aortic Value  Area Index 0.39 cm2/BSA  RA A Wave 7 mmHg  RA V Wave 7 mmHg  RA Mean 4 mmHg  RV Systolic Pressure 43 mmHg  RV Diastolic Pressure -1 mmHg  RV EDP 4 mmHg  PA Systolic Pressure 46 mmHg  PA Diastolic Pressure 15 mmHg  PA Mean 27 mmHg  PW A Wave 15 mmHg  PW V Wave 28 mmHg  PW Mean 15 mmHg  AO Systolic Pressure 129 mmHg  AO Diastolic Pressure 53 mmHg  AO Mean 83 mmHg  LV Systolic Pressure 160 mmHg  LV Diastolic Pressure 4 mmHg  LV EDP 12 mmHg  AOp Systolic Pressure 112 mmHg  AOp Diastolic Pressure 49 mmHg  AOp Mean Pressure 73 mmHg  LVp Systolic Pressure 173 mmHg  LVp Diastolic Pressure 6 mmHg  LVp EDP Pressure 14 mmHg  TPVR Index 8.51 HRUI  TSVR Index 26.17 HRUI  PVR SVR Ratio 0.15  TPVR/TSVR Ratio 0.33    Impression:  This 82 year old gentleman has stage D3, severe, paradoxical low-flow/low gradient aortic stenosis with NYHA class III symptoms of exertional fatigue and shortness of breath consistent with chronic diastolic congestive  heart failure.  I have personally reviewed his 2D echocardiogram, cardiac catheterization, and CTA studies.  His echocardiogram shows a calcified and thickened aortic valve with restricted leaflet mobility.  The mean gradient was 27 mmHg with a peak gradient of 47 mmHg and a valve area by VTI of 0.87 cm.  Dimensionless index was 0.25 with a stroke-volume index of 30.  Left ventricular ejection fraction was 55 to 60% with grade 2 diastolic dysfunction.  Cardiac catheterization showed severe native three-vessel coronary disease with 4/4 patent grafts.  The mean gradient across aortic valve was measured at 46 mmHg.  I agree that aortic valve replacement is indicated in this patient for relief of his worsening symptoms and to prevent left ventricular deterioration.  Given his advanced age, prior coronary bypass surgery, and end-stage renal disease on hemodialysis I think that TAVR would be the best option for treating him.  His gated cardiac CTA shows anatomy  suitable for TAVR using a 26 mm SAPIEN 3 valve.  His abdominal and pelvic CTA shows adequate pelvic vascular anatomy to allow transfemoral insertion.  The patient and his wife were counseled at length regarding treatment alternatives for management of severe symptomatic aortic stenosis. The risks and benefits of surgical intervention has been discussed in detail. Long-term prognosis with medical therapy was discussed. Alternative approaches such as conventional surgical aortic valve replacement, transcatheter aortic valve replacement, and palliative medical therapy were compared and contrasted at length. This discussion was placed in the context of the patient's own specific clinical presentation and past medical history. All of their questions have been addressed.   Following the decision to proceed with transcatheter aortic valve replacement, a discussion was held regarding what types of management strategies would be attempted intraoperatively in the event of life-threatening complications, including whether or not the patient would be considered a candidate for the use of cardiopulmonary bypass and/or conversion to open sternotomy for attempted surgical intervention.  I do not think he is a candidate for emergent sternotomy or cardiopulmonary bypass given his advanced age, prior coronary bypass surgery, and end-stage renal disease.  The patient has been advised of a variety of complications that might develop including but not limited to risks of death, stroke, paravalvular leak, aortic dissection or other major vascular complications, aortic annulus rupture, device embolization, cardiac rupture or perforation, mitral regurgitation, acute myocardial infarction, arrhythmia, heart block or bradycardia requiring permanent pacemaker placement, congestive heart failure, respiratory failure, renal failure, pneumonia, infection, other late complications related to structural valve deterioration or migration, or  other complications that might ultimately cause a temporary or permanent loss of functional independence or other long term morbidity. The patient provides full informed consent for the procedure as described and all questions were answered.   His chest CTA also shows a small part solid nodule in the left upper lobe of the lung which is stable compared to his recent CT of the chest on 06/05/2022.  The small solid component is new compared to his prior CT scan in October 2020.  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  Plan:  He will be scheduled for transfemoral TAVR using a SAPIEN 3 valve in the next few weeks.  I spent 60 minutes performing this consultation and > 50% of this time was spent face to face counseling and coordinating the care of this patient's severe symptomatic aortic stenosis.   Alleen Borne, MD 08/19/2022

## 2022-08-20 DIAGNOSIS — E1122 Type 2 diabetes mellitus with diabetic chronic kidney disease: Secondary | ICD-10-CM | POA: Diagnosis not present

## 2022-08-20 DIAGNOSIS — Z992 Dependence on renal dialysis: Secondary | ICD-10-CM | POA: Diagnosis not present

## 2022-08-20 DIAGNOSIS — N2581 Secondary hyperparathyroidism of renal origin: Secondary | ICD-10-CM | POA: Diagnosis not present

## 2022-08-20 DIAGNOSIS — D631 Anemia in chronic kidney disease: Secondary | ICD-10-CM | POA: Diagnosis not present

## 2022-08-20 DIAGNOSIS — N186 End stage renal disease: Secondary | ICD-10-CM | POA: Diagnosis not present

## 2022-08-21 ENCOUNTER — Telehealth: Payer: Self-pay | Admitting: Internal Medicine

## 2022-08-21 ENCOUNTER — Encounter: Payer: Self-pay | Admitting: Physician Assistant

## 2022-08-21 ENCOUNTER — Other Ambulatory Visit: Payer: Self-pay | Admitting: Physician Assistant

## 2022-08-21 ENCOUNTER — Ambulatory Visit (HOSPITAL_COMMUNITY)
Admission: RE | Admit: 2022-08-21 | Discharge: 2022-08-21 | Disposition: A | Discharge status: Home / Self Care | Payer: Medicare Other | Source: Ambulatory Visit | Attending: Physician Assistant | Admitting: Physician Assistant

## 2022-08-21 ENCOUNTER — Encounter (HOSPITAL_COMMUNITY)
Admission: RE | Admit: 2022-08-21 | Discharge: 2022-08-21 | Disposition: A | Discharge status: Home / Self Care | Payer: Medicare Other | Source: Ambulatory Visit | Attending: Internal Medicine | Admitting: Internal Medicine

## 2022-08-21 VITALS — Ht 71.0 in | Wt 220.0 lb

## 2022-08-21 DIAGNOSIS — Z1152 Encounter for screening for COVID-19: Secondary | ICD-10-CM | POA: Diagnosis not present

## 2022-08-21 DIAGNOSIS — Z01818 Encounter for other preprocedural examination: Secondary | ICD-10-CM | POA: Diagnosis not present

## 2022-08-21 DIAGNOSIS — I4510 Unspecified right bundle-branch block: Secondary | ICD-10-CM | POA: Diagnosis not present

## 2022-08-21 DIAGNOSIS — I350 Nonrheumatic aortic (valve) stenosis: Secondary | ICD-10-CM | POA: Insufficient documentation

## 2022-08-21 DIAGNOSIS — R918 Other nonspecific abnormal finding of lung field: Secondary | ICD-10-CM | POA: Diagnosis not present

## 2022-08-21 LAB — SARS CORONAVIRUS 2 (TAT 6-24 HRS): SARS Coronavirus 2: NEGATIVE

## 2022-08-21 LAB — TYPE AND SCREEN
ABO/RH(D): O POS
Antibody Screen: NEGATIVE

## 2022-08-21 LAB — SURGICAL PCR SCREEN
MRSA, PCR: NEGATIVE
Staphylococcus aureus: NEGATIVE

## 2022-08-21 LAB — CBC
HCT: 28.8 % — ABNORMAL LOW (ref 39.0–52.0)
Hemoglobin: 9.4 g/dL — ABNORMAL LOW (ref 13.0–17.0)
MCH: 33.7 pg (ref 26.0–34.0)
MCHC: 32.6 g/dL (ref 30.0–36.0)
MCV: 103.2 fL — ABNORMAL HIGH (ref 80.0–100.0)
Platelets: 153 10*3/uL (ref 150–400)
RBC: 2.79 MIL/uL — ABNORMAL LOW (ref 4.22–5.81)
RDW: 16.3 % — ABNORMAL HIGH (ref 11.5–15.5)
WBC: 9.1 10*3/uL (ref 4.0–10.5)
nRBC: 0 % (ref 0.0–0.2)

## 2022-08-21 LAB — URINALYSIS, ROUTINE W REFLEX MICROSCOPIC
Bacteria, UA: NONE SEEN
Bilirubin Urine: NEGATIVE
Glucose, UA: 150 mg/dL — AB
Hgb urine dipstick: NEGATIVE
Ketones, ur: NEGATIVE mg/dL
Leukocytes,Ua: NEGATIVE
Nitrite: NEGATIVE
Protein, ur: 100 mg/dL — AB
Specific Gravity, Urine: 1.011 (ref 1.005–1.030)
pH: 7 (ref 5.0–8.0)

## 2022-08-21 LAB — COMPREHENSIVE METABOLIC PANEL
ALT: 34 U/L (ref 0–44)
AST: 23 U/L (ref 15–41)
Albumin: 3 g/dL — ABNORMAL LOW (ref 3.5–5.0)
Alkaline Phosphatase: 121 U/L (ref 38–126)
Anion gap: 13 (ref 5–15)
BUN: 45 mg/dL — ABNORMAL HIGH (ref 8–23)
CO2: 25 mmol/L (ref 22–32)
Calcium: 9.2 mg/dL (ref 8.9–10.3)
Chloride: 96 mmol/L — ABNORMAL LOW (ref 98–111)
Creatinine, Ser: 6.43 mg/dL — ABNORMAL HIGH (ref 0.61–1.24)
GFR, Estimated: 8 mL/min — ABNORMAL LOW (ref >60)
Glucose, Bld: 181 mg/dL — ABNORMAL HIGH (ref 70–99)
Potassium: 4 mmol/L (ref 3.5–5.1)
Sodium: 134 mmol/L — ABNORMAL LOW (ref 135–145)
Total Bilirubin: 0.5 mg/dL (ref 0.3–1.2)
Total Protein: 6.6 g/dL (ref 6.5–8.1)

## 2022-08-21 LAB — PROTIME-INR
INR: 1.2 (ref 0.8–1.2)
Prothrombin Time: 15.1 seconds (ref 11.4–15.2)

## 2022-08-21 NOTE — Progress Notes (Addendum)
Patient is scheduled for TAVR surgery on 08/25/22.  Patient has [employer] Pacemaker, Reps was notified of surgical date and time via email.  Last remote check was on 05/28/22.  Perioperative Prescription for ICD Programming was initiated, awaiting results.  PCP - Dr Alysia Penna Cardiologist - Dr Kristeen Miss EP- Dr Excell Seltzer  Chest x-ray - 08/21/22 EKG - 08/21/22 Stress Test - Years ago ECHO - 06/22/22 Cardiac Cath - 07/31/22  Sleep Study -  Yes CPAP - wears CPAP  Diabetes Type 2  Anesthesia review: Yes  STOP now taking any Aspirin (unless otherwise instructed by your surgeon), Aleve, Naproxen, Ibuprofen, Motrin, Advil, Goody's, BC's, all herbal medications, fish oil, and all vitamins.

## 2022-08-21 NOTE — Telephone Encounter (Signed)
Bradley Soto, calling with abnormal lab results.

## 2022-08-21 NOTE — Progress Notes (Signed)
Patient came for Pre-Op Labs/EKG/X-rays for planned TAVR surgery on Tuesday, 08/25/22 via wheelchair.  Information and Instructions  for DOS was reviewed with patient verbally and a written copied given to patient.  Reviewed how to use CHG soap the night prior and morning of surgery.  Lab results are pending.  Sent chart for review to [place], [place].    Patient verbalized understanding of instructions that were given at Pre-OP appointment.

## 2022-08-21 NOTE — Telephone Encounter (Signed)
Call from [facility] in pre-op testing to report abnormal results for patient who is scheduled for TAVR on 08/25/22. Cr and HGB reported to Cline Crock who stated these were expected findings.

## 2022-08-21 NOTE — Progress Notes (Signed)
Called MD's office for abn lab results from today's blood draw.  Spoke with Alcario Drought Cline Crock, PA to review all labs in EPIC dated 08/21/22.  Office number is [phone].  PA to return call if any questions.

## 2022-08-22 DIAGNOSIS — N2581 Secondary hyperparathyroidism of renal origin: Secondary | ICD-10-CM | POA: Diagnosis not present

## 2022-08-22 DIAGNOSIS — Z992 Dependence on renal dialysis: Secondary | ICD-10-CM | POA: Diagnosis not present

## 2022-08-22 DIAGNOSIS — D631 Anemia in chronic kidney disease: Secondary | ICD-10-CM | POA: Diagnosis not present

## 2022-08-22 DIAGNOSIS — N186 End stage renal disease: Secondary | ICD-10-CM | POA: Diagnosis not present

## 2022-08-22 DIAGNOSIS — E1122 Type 2 diabetes mellitus with diabetic chronic kidney disease: Secondary | ICD-10-CM | POA: Diagnosis not present

## 2022-08-24 DIAGNOSIS — Z992 Dependence on renal dialysis: Secondary | ICD-10-CM | POA: Diagnosis not present

## 2022-08-24 DIAGNOSIS — N186 End stage renal disease: Secondary | ICD-10-CM | POA: Diagnosis not present

## 2022-08-24 DIAGNOSIS — E1122 Type 2 diabetes mellitus with diabetic chronic kidney disease: Secondary | ICD-10-CM | POA: Diagnosis not present

## 2022-08-24 DIAGNOSIS — D631 Anemia in chronic kidney disease: Secondary | ICD-10-CM | POA: Diagnosis not present

## 2022-08-24 DIAGNOSIS — N2581 Secondary hyperparathyroidism of renal origin: Secondary | ICD-10-CM | POA: Diagnosis not present

## 2022-08-24 MED ORDER — NOREPINEPHRINE 4 MG/250ML-% IV SOLN
0.0000 ug/min | INTRAVENOUS | Status: AC
  Administered 2022-08-25: 2 ug/min via INTRAVENOUS
  Filled 2022-08-24: qty 250

## 2022-08-24 MED ORDER — MAGNESIUM SULFATE 50 % IJ SOLN
40.0000 meq | INTRAMUSCULAR | Status: DC
  Filled 2022-08-24: qty 9.85

## 2022-08-24 MED ORDER — POTASSIUM CHLORIDE 2 MEQ/ML IV SOLN
80.0000 meq | INTRAVENOUS | Status: DC
  Filled 2022-08-24: qty 40

## 2022-08-24 MED ORDER — DEXMEDETOMIDINE HCL IN NACL 400 MCG/100ML IV SOLN
0.1000 ug/kg/h | INTRAVENOUS | Status: AC
  Administered 2022-08-25 (×2): 49.5 ug via INTRAVENOUS
  Filled 2022-08-24: qty 100

## 2022-08-24 MED ORDER — HEPARIN 30,000 UNITS/1000 ML (OHS) CELLSAVER SOLUTION
Status: DC
  Filled 2022-08-24: qty 1000

## 2022-08-24 MED ORDER — CEFAZOLIN SODIUM-DEXTROSE 2-4 GM/100ML-% IV SOLN
2.0000 g | INTRAVENOUS | Status: AC
  Administered 2022-08-25: 2 g via INTRAVENOUS
  Filled 2022-08-24 (×2): qty 100

## 2022-08-24 NOTE — H&P (Signed)
[address]       [place] [address]             [phone]      Cardiothoracic Surgery Admission History and Physical  PCP is Alysia Penna, MD Referring Provider is Tonny Bollman, MD Primary Cardiologist is Kristeen Miss, MD   Reason for admission: Severe aortic stenosis   HPI:   The patient is an 82 year old gentleman with a history of hypertension, hyperlipidemia, type 2 diabetes, end-stage renal disease on hemodialysis Tuesday Thursday and Saturday, atrial fibrillation, OSA on CPAP, and coronary artery disease status post multivessel CABG in 2020 by Dr. Vickey Sages with a LIMA to the LAD, SVG to the ramus and OM 3, and SVG to the diagonal branch.  The distal right coronary artery branches were too small to bypass.  He has developed progressive exertional fatigue and shortness of breath.  Dimensionless index was 0.25 with a stroke-volume index of 30.  Left ventricular ejection fraction was 55 to 60% with grade 2 diastolic dysfunction.   He lives with his wife.  He has been on dialysis for 3 years and has recently not been tolerating it very well.  His dialysis sessions have been cut short due to hypotension.  He has worsening exertional fatigue and shortness of breath that seems to be worse on dialysis days.  He is walking around his house with a walker but gets short of breath doing that.  He has had some lightheadedness after dialysis due to low blood pressure but denies any syncope.  He reports some discomfort in his chest.  He denies any lower extremity edema.  His wife feels like he has been getting worse over the past few weeks.  He had a recent fall and suffered a comminuted fracture of the right scapula which she said seems to be getting better.       Past Medical History:  Diagnosis Date   Anxiety     Atrial fibrillation (HCC)     Bell's palsy      left side of face droops   CAD (coronary artery disease)      s/p stent placement   CKD (chronic kidney  disease), stage III (HCC)      [place]- [facility]- [facility]   Colon polyps     Diabetes mellitus without complication (HCC)     Gout     History of blood transfusion     History of kidney stones      Passes   HLD (hyperlipidemia)     HTN (hypertension)     Insomnia     MI (myocardial infarction) (HCC) 1998   Neuropathy     NSTEMI (non-ST elevated myocardial infarction) (HCC)      s/p PCI in early 2000s   OSA on CPAP      wears CPAP   Presence of permanent cardiac pacemaker     SSS (sick sinus syndrome) (HCC)     Type II or unspecified type diabetes mellitus without mention of complication, not stated as uncontrolled     Wears glasses      reading    Wears partial dentures             Past Surgical History:  Procedure Laterality Date   APPENDECTOMY       AV FISTULA PLACEMENT Right 07/28/2019    Procedure: RIGHT ARM Brachiocephalic ARTERIOVENOUS (AV) FISTULA CREATION;  Surgeon: Nada Libman, MD;  Location: [facility] OR;  Service:  Vascular;  Laterality: Right;   CATARACT EXTRACTION       CLIPPING OF ATRIAL APPENDAGE   12/05/2018    Procedure: Clipping Of Atrial Appendage;  Surgeon: Linden Dolin, MD;  Location: [facility] OR;  Service: Open Heart Surgery;;   CORONARY ANGIOPLASTY WITH STENT PLACEMENT        x4   CORONARY ARTERY BYPASS GRAFT N/A 12/05/2018    Procedure: CORONARY ARTERY BYPASS GRAFTING (CABG) X FOUR USING LEFT INTERNAL MAMMARY ARTERY AND RIGHT SAPHENOUS VEIN GRAFTS;  Surgeon: Linden Dolin, MD;  Location: [facility] OR;  Service: Open Heart Surgery;  Laterality: N/A;   HERNIA REPAIR        ventral hernia repair with mesh--revision also was required   INSERT / REPLACE / REMOVE PACEMAKER   05/10/2000    pacemaker implanted in [place]]   IR THORACENTESIS ASP PLEURAL SPACE W/IMG GUIDE   12/21/2018   IR THORACENTESIS ASP PLEURAL SPACE W/IMG GUIDE   12/30/2018   LEFT HEART CATH AND CORONARY ANGIOGRAPHY N/A 11/30/2018    Procedure: LEFT HEART CATH AND CORONARY ANGIOGRAPHY;   Surgeon: Kathleene Hazel, MD;  Location: [facility] INVASIVE CV LAB;  Service: Cardiovascular;  Laterality: N/A;   MAZE N/A 12/05/2018    Procedure: MAZE;  Surgeon: Linden Dolin, MD;  Location: [facility] OR;  Service: Open Heart Surgery;  Laterality: N/A;   PACEMAKER GENERATOR CHANGE   09/03/09    MDT Adapta generator change in [place]   PARTIAL COLON REMOVAL   2/2    polyp that could not be removed on colonoscopy   PPM GENERATOR CHANGEOUT N/A 06/01/2019    Procedure: PPM GENERATOR CHANGEOUT;  Surgeon: Hillis Range, MD;  Location: [facility] INVASIVE CV [facility];  Service: Cardiovascular;  Laterality: N/A;   REVISON OF ARTERIOVENOUS FISTULA Right 01/24/2020    Procedure: BANDING OF RIGHT ARM ARTERIOVENOUS FISTULA;  Surgeon: Nada Libman, MD;  Location: [facility];  Service: Vascular;  Laterality: Right;   RIGHT/LEFT HEART CATH AND CORONARY/GRAFT ANGIOGRAPHY N/A 07/31/2022    Procedure: RIGHT/LEFT HEART CATH AND CORONARY/GRAFT ANGIOGRAPHY;  Surgeon: Marykay Lex, MD;  Location: [facility] INVASIVE CV LAB;  Service: Cardiovascular;  Laterality: N/A;   TEE WITHOUT CARDIOVERSION N/A 12/05/2018    Procedure: TRANSESOPHAGEAL ECHOCARDIOGRAM (TEE);  Surgeon: Linden Dolin, MD;  Location: [facility] OR;  Service: Open Heart Surgery;  Laterality: N/A;           Family History  Problem Relation Age of Onset   Heart attack Father 29   Heart disease Father     Breast cancer Sister     Pulmonary embolism Sister     Deep vein thrombosis Sister        Social History         Socioeconomic History   Marital status: Married      Spouse name: Not on file   Number of children: Not on file   Years of education: Not on file   Highest education level: Not on file  Occupational History   Not on file  Tobacco Use   Smoking status: Former      Types: Cigarettes   Smokeless tobacco: Never   Tobacco comments:      " many years ago"  Vaping Use   Vaping Use: Never used  Substance and Sexual Activity   Alcohol use: No   Drug  use: No   Sexual activity: Not on file  Other Topics Concern   Not on file  Social History Narrative  Lives with wife    Caffeine coffee 2 daily    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    Social Determinants of Health    Financial Resource Strain: Not on file  Food Insecurity: Not on file  Transportation Needs: Not on file  Physical Activity: Not on file  Stress: Not on file  Social Connections: Not on file  Intimate Partner Violence: Not on file             Prior to Admission medications   Medication Sig Start Date End Date Taking? Authorizing Provider  allopurinol (ZYLOPRIM) 100 MG tablet Take 1 tablet (100 mg total) by mouth daily. 11/03/19   Yes [name], Earl Lites, MD  aspirin EC 81 MG tablet Take 81 mg by mouth in the morning.     Yes [provider]  B Complex-C-Zn-Folic Acid (DIALYVITE 800 WITH ZINC) 0.8 MG TABS Take 1 tablet by mouth at bedtime. 11/08/19   Yes [provider]  CALCITRIOL PO Take 4-5 tablets by mouth See admin instructions. Patient takes 4-5 calcitrol vitamin d tablets (1.75mg  per patient's spouse) during dialysis (T, TH, Sat).     Yes [provider]  Copper 5 MG TABS Take 1 tablet (5 mg total) by mouth daily. Patient taking differently: Take 10 mg by mouth in the morning. 12/15/21   Yes Pennington, Rushie Goltz, PA-C  [employer] VITAMIN B12 1000 MCG tablet TAKE 1 TABLET BY MOUTH EVERY DAY 03/26/22   Yes Pennington, Rebekah M, PA-C  diltiazem (CARDIZEM SR) 60 MG 12 hr capsule Take 1 capsule (60 mg total) by mouth as needed (palpitations). 05/22/22   Yes Conte, Tessa N, PA-C  gabapentin (NEURONTIN) 100 MG capsule Take 1 capsule (100 mg total) by mouth 3 (three) times daily. Patient taking differently: Take 300 mg by mouth in the morning. 09/24/21   Yes Maeola Harman, MD  iron sucrose in sodium chloride 0.9 % 100 mL once a week. 08/26/21 09/22/22 Yes [provider]  Methoxy PEG-Epoetin Beta (MIRCERA IJ) Mircera 05/23/22  05/22/23 Yes [provider]  Methoxy PEG-Epoetin Beta (MIRCERA IJ) Mircera 06/09/22 06/08/23 Yes [provider]  midodrine (PROAMATINE) 5 MG tablet Take by mouth. Per patient taking Tuesdays, Thursdays and Saturdays before dialysis 06/25/22   Yes [provider]  nitroGLYCERIN (NITROSTAT) 0.4 MG SL tablet Place 1 tablet (0.4 mg total) under the tongue every 5 (five) minutes as needed for chest pain. 05/22/22   Yes Conte, Tessa N, PA-C  NOVOLOG MIX 70/30 FLEXPEN (70-30) 100 UNIT/ML FlexPen Inject 30 Units into the skin in the morning and at bedtime. 02/26/16   Yes [provider]  oxyCODONE-acetaminophen (PERCOCET/ROXICET) 5-325 MG tablet Take 1 tablet by mouth every 6 (six) hours as needed. 08/05/22   Yes Triplett, Tammy, PA-C  sevelamer carbonate (RENVELA) 800 MG tablet Take 1 tablet (800 mg total) by mouth 3 (three) times daily with meals. Patient taking differently: Take 2,400 mg by mouth 3 (three) times daily with meals. 11/03/19   Yes [name], Earl Lites, MD            Current Outpatient Medications  Medication Sig Dispense Refill   allopurinol (ZYLOPRIM) 100 MG tablet Take 1 tablet (100 mg total) by mouth daily. 30 tablet 3   aspirin EC 81 MG tablet Take 81 mg by mouth in the morning.       B Complex-C-Zn-Folic Acid (DIALYVITE 800 WITH ZINC) 0.8 MG TABS Take 1 tablet by mouth at bedtime.  CALCITRIOL PO Take 4-5 tablets by mouth See admin instructions. Patient takes 4-5 calcitrol vitamin d tablets (1.75mg  per patient's spouse) during dialysis (T, TH, Sat).       Copper 5 MG TABS Take 1 tablet (5 mg total) by mouth daily. (Patient taking differently: Take 10 mg by mouth in the morning.) 90 tablet 0   CVS VITAMIN B12 1000 MCG tablet TAKE 1 TABLET BY MOUTH EVERY DAY 90 tablet 0   diltiazem (CARDIZEM SR) 60 MG 12 hr capsule Take 1 capsule (60 mg total) by mouth as needed (palpitations). 30 capsule 3   gabapentin (NEURONTIN) 100 MG capsule Take 1 capsule (100 mg  total) by mouth 3 (three) times daily. (Patient taking differently: Take 300 mg by mouth in the morning.) 90 capsule 3   iron sucrose in sodium chloride 0.9 % 100 mL once a week.       Methoxy PEG-Epoetin Beta (MIRCERA IJ) Mircera       Methoxy PEG-Epoetin Beta (MIRCERA IJ) Mircera       midodrine (PROAMATINE) 5 MG tablet Take by mouth. Per patient taking Tuesdays, Thursdays and Saturdays before dialysis       nitroGLYCERIN (NITROSTAT) 0.4 MG SL tablet Place 1 tablet (0.4 mg total) under the tongue every 5 (five) minutes as needed for chest pain. 25 tablet 3   NOVOLOG MIX 70/30 FLEXPEN (70-30) 100 UNIT/ML FlexPen Inject 30 Units into the skin in the morning and at bedtime.   3   oxyCODONE-acetaminophen (PERCOCET/ROXICET) 5-325 MG tablet Take 1 tablet by mouth every 6 (six) hours as needed. 10 tablet 0   sevelamer carbonate (RENVELA) 800 MG tablet Take 1 tablet (800 mg total) by mouth 3 (three) times daily with meals. (Patient taking differently: Take 2,400 mg by mouth 3 (three) times daily with meals.) 90 tablet 3    No current facility-administered medications for this visit.          Allergies  Allergen Reactions   Shellfish Allergy Rash          Review of Systems:               General:                      normal appetite, + decreased energy, no weight gain, no weight loss, no fever             Cardiac:                       + chest pain with exertion, no chest pain at rest, +SOB with mild exertion, no resting SOB, no PND, no orthopnea, no palpitations, no arrhythmia, + atrial fibrillation, no LE edema, + dizzy spells, no syncope             Respiratory:                 + shortness of breath, no home oxygen, no productive cough, no dry cough, no bronchitis, no wheezing, no hemoptysis, n asthma, ono pain with inspiration or cough, + sleep apnea, + CPAP at night             GI:                               + difficulty swallowing, no reflux, no frequent heartburn, no hiatal hernia, no  abdominal pain, no constipation, no diarrhea, no  hematochezia, no hematemesis, no melena             GU:                              no dysuria,  no frequency, no urinary tract infection, no hematuria, no enlarged prostate, no kidney stones, +end stage kidney disease on HD             Vascular:                     no pain suggestive of claudication, + pain in feet, no leg cramps, no varicose veins, no DVT, no non-healing foot ulcer             Neuro:                         no stroke, no TIA's, no seizures, no headaches, no temporary blindness one eye,  no slurred speech, + peripheral neuropathy, no chronic pain, + instability of gait, no memory/cognitive dysfunction             Musculoskeletal:         + arthritis, no joint swelling, no myalgias, + difficulty walking, + decreased mobility              Skin:                            no rash, no itching, no skin infections, no pressure sores or ulcerations             Psych:                         no anxiety, no depression, no nervousness, no unusual recent stress             Eyes:                           no blurry vision, no floaters, no recent vision changes, no  glasses or contacts             ENT:                            no hearing loss, no loose or painful teeth, no dentures, last saw dentist May 2024             Hematologic:               + easy bruising, no abnormal bleeding, no clotting disorder, no frequent epistaxis             Endocrine:                   + diabetes, occasionally checks CBG's at home                            Physical Exam:               BP (!) 80/50   Pulse 90   Resp 20   Ht 5\' 11"  (1.803 m)   Wt 220 lb (99.8 kg)   SpO2 95% Comment: RA  BMI 30.68 kg/m  General:                      Chronically ill-appearing             HEENT:                       Unremarkable, NCAT, PERLA, EOMI             Neck:                           no JVD, no bruits, no adenopathy              Chest:                           clear to auscultation, symmetrical breath sounds, no wheezes, no rhonchi              CV:                              RRR, 3/6 systolic murmur RSB, no diastolic murmur             Abdomen:                    soft, non-tender, no masses              Extremities:                 warm, well-perfused, pedal pulses palpable, no lower extremity edema             Rectal/GU                   Deferred             Neuro:                         Grossly non-focal and symmetrical throughout             Skin:                            Clean and dry, no rashes, no breakdown   Diagnostic Tests:   ECHOCARDIOGRAM REPORT       Patient Name:   Bradley Soto Date of Exam: 06/22/2022  Medical Rec #:  [id number]      Height:       71.0 in  Accession #:    [id number]     Weight:       215.8 lb  Date of Birth:  [birth year]       BSA:          2.178 m  Patient Age:    81 years       BP:           131/72 mmHg  Patient Gender: M              HR:           87 bpm.  Exam Location:  [address]   Procedure: 2D Echo, Cardiac Doppler, Color Doppler and Strain Analysis   Indications:    R06.00 SOB    History:        Patient has prior history of Echocardiogram examinations,  most  recent 12/01/2018. Previous Myocardial Infarction, Prior  CABG                 and Pacemaker, CKD, Arrythmias:Atrial Fibrillation; Risk                  Factors:Sleep Apnea and Diabetes.    Sonographer:    Clearence Ped RCS  Referring Phys: 48 TESSA N CONTE   IMPRESSIONS     1. Left ventricular ejection fraction, by estimation, is 55 to 60%. The  left ventricle has normal function. The left ventricle has no regional  wall motion abnormalities. There is mild asymmetric left ventricular  hypertrophy of the septal segment. Left  ventricular diastolic parameters are consistent with Grade II diastolic  dysfunction (pseudonormalization). Elevated left ventricular end-diastolic  pressure. The average left  ventricular global longitudinal strain is -15.3  %. The global longitudinal  strain is abnormal.   2. Right ventricular systolic function is normal. The right ventricular  size is mildly enlarged. There is normal pulmonary artery systolic  pressure.   3. The mitral valve is normal in structure. Trivial mitral valve  regurgitation. No evidence of mitral stenosis.   4. The aortic valve is calcified. Aortic valve regurgitation is trivial.  Moderate to severe aortic valve stenosis. Aortic regurgitation PHT  measures 589 msec. Aortic valve area, by VTI measures 0.87 cm. Aortic  valve mean gradient measures 27.0 mmHg.  Aortic valve Vmax measures 3.44 m/s.   5. There is mild dilatation of the ascending aorta, measuring 38 mm.   6. The inferior vena cava is normal in size with greater than 50%  respiratory variability, suggesting right atrial pressure of 3 mmHg.   FINDINGS   Left Ventricle: Left ventricular ejection fraction, by estimation, is 55  to 60%. The left ventricle has normal function. The left ventricle has no  regional wall motion abnormalities. The average left ventricular global  longitudinal strain is -15.3 %.  The global longitudinal strain is abnormal. The left ventricular internal  cavity size was normal in size. There is mild asymmetric left ventricular  hypertrophy of the septal segment. Left ventricular diastolic parameters  are consistent with Grade II  diastolic dysfunction (pseudonormalization). Elevated left ventricular  end-diastolic pressure.   Right Ventricle: The right ventricular size is mildly enlarged. No  increase in right ventricular wall thickness. Right ventricular systolic  function is normal. There is normal pulmonary artery systolic pressure.  The tricuspid regurgitant velocity is 1.65   m/s, and with an assumed right atrial pressure of 3 mmHg, the estimated  right ventricular systolic pressure is 13.9 mmHg.   Left Atrium: Left atrial size was normal  in size.   Right Atrium: Right atrial size was normal in size.   Pericardium: There is no evidence of pericardial effusion. Presence of  epicardial fat layer.   Mitral Valve: The mitral valve is normal in structure. Trivial mitral  valve regurgitation. No evidence of mitral valve stenosis.   Tricuspid Valve: The tricuspid valve is normal in structure. Tricuspid  valve regurgitation is not demonstrated. No evidence of tricuspid  stenosis.   Aortic Valve: The aortic valve is calcified. Aortic valve regurgitation is  trivial. Aortic regurgitation PHT measures 589 msec. Moderate to severe  aortic stenosis is present. Aortic valve mean gradient measures 27.0 mmHg.  Aortic valve peak gradient  measures 47.3 mmHg. Aortic valve area, by VTI measures 0.87 cm.   Pulmonic Valve: The pulmonic valve was normal in structure. Pulmonic valve  regurgitation is  not visualized. No evidence of pulmonic stenosis.   Aorta: There is mild dilatation of the ascending aorta, measuring 38 mm.   Venous: The inferior vena cava is normal in size with greater than 50%  respiratory variability, suggesting right atrial pressure of 3 mmHg.   IAS/Shunts: No atrial level shunt detected by color flow Doppler.     LEFT VENTRICLE  PLAX 2D  LVIDd:         5.00 cm   Diastology  LVIDs:         3.60 cm   LV e' medial:    5.22 cm/s  LV PW:         0.80 cm   LV E/e' medial:  20.7  LV IVS:        1.30 cm   LV e' lateral:   6.64 cm/s  LVOT diam:     2.10 cm   LV E/e' lateral: 16.3  LV SV:         66  LV SV Index:   30        2D Longitudinal Strain  LVOT Area:     3.46 cm  2D Strain GLS (A2C):   -13.5 %                           2D Strain GLS (A3C):   -16.9 %                           2D Strain GLS (A4C):   -15.4 %                           2D Strain GLS Avg:     -15.3 %   RIGHT VENTRICLE  RV Basal diam:  4.20 cm  RV Mid diam:    2.90 cm  RV S prime:     9.79 cm/s  TAPSE (M-mode): 1.9 cm  RVSP:           13.9  mmHg   LEFT ATRIUM             Index        RIGHT ATRIUM           Index  LA diam:        4.30 cm 1.97 cm/m   RA Pressure: 3.00 mmHg  LA Vol (A2C):   76.2 ml 34.99 ml/m  RA Area:     15.70 cm  LA Vol (A4C):   63.7 ml 29.25 ml/m  RA Volume:   38.70 ml  17.77 ml/m  LA Biplane Vol: 70.3 ml 32.28 ml/m   AORTIC VALVE  AV Area (Vmax):    0.87 cm  AV Area (Vmean):   0.80 cm  AV Area (VTI):     0.87 cm  AV Vmax:           344.00 cm/s  AV Vmean:          246.000 cm/s  AV VTI:            0.761 m  AV Peak Grad:      47.3 mmHg  AV Mean Grad:      27.0 mmHg  LVOT Vmax:         86.20 cm/s  LVOT Vmean:        56.800 cm/s  LVOT VTI:  0.191 m  LVOT/AV VTI ratio: 0.25  AI PHT:            589 msec    AORTA  Ao Root diam: 3.40 cm  Ao Asc diam:  3.80 cm   MITRAL VALVE                TRICUSPID VALVE  MV Area (PHT):              TR Peak grad:   10.9 mmHg  MV Decel Time:              TR Vmax:        165.00 cm/s  MV E velocity: 108.00 cm/s  Estimated RAP:  3.00 mmHg  MV A velocity: 108.00 cm/s  RVSP:           13.9 mmHg  MV E/A ratio:  1.00                              SHUNTS                              Systemic VTI:  0.19 m                              Systemic Diam: 2.10 cm   Lavona Mound Tobb DO  Electronically signed by Thomasene Ripple DO  Signature Date/Time: 06/22/2022/3:40:21 PM        Final      Physicians   Panel Physicians Referring Physician Case Authorizing Physician  Marykay Lex, MD (Primary)        Procedures   RIGHT/LEFT HEART CATH AND CORONARY/GRAFT ANGIOGRAPHY    Conclusion       Prox LAD to Mid LAD lesion is 85% stenosed between SP1 & 2. After Sp2, Mid LAD lesion is 100% stenosed with 100% stenosed side branch in 2nd Diag.   Ostial-Proximal Ramus Stent is 100% stenosed.   Prox Cx to Mid Cx lesion is 95% stenosed (involving previously placed DES STENT) with 85% stenosed side branch in LPAV.   Previously placed 1st Mrg Ostial-proximal DES STENT is 99%  stenosed.   Dist Cx l(after 1st Mrg) preciously placed DES STENT is 100% stenosed with 100% stenosed side branch in 2nd Mrg.   Ost RCA to Mid RCA lesion is 100% stenosed.   --------------4/4 PATENT GRAFTS-----------------------------   SVG graft was visualized by angiography and is normal in caliber.  The graft exhibits no disease. The flow is not reversed.   LIMA graft was visualized by angiography and is normal in caliber. The graft exhibits no disease. The flow is not reversed.   Seq SVG- Ramus-3rdMrg graft was visualized by angiography and is normal in caliber. The graft exhibits no disease. The flow is not reversed.   -----------------HEMODYMAMICS-------------------------   Hemodynamic findings consistent with mild pulmonary hypertension. LV end diastolic pressure is normal.   There is severe aortic valve stenosis - > mean gradient estimated 46 mmHg.  Prominent V wave on PCWP waveform.     POST-CATH DIAGNOSES Severe native vessel CAD:  85% proximal to mid LAD after her SP 1 and D1, then 100 and occluded after SP2 99% proximal to mid LCx prior to stent without occlusion of the distal LCx after OM1 and 99% OM1 100% CTO of RI stent 100% proximal RCA occlusion with  extensive small caliber left-to-right collaterals filling the distal branch. 4 of 4 grafts patent: Free LIMA-LAD (from SVG-D2 hood), SVG-D2, SeqSVG-RI-OM 2 Difficult to fully assess valve gradient as the patient went into a paced rhythm with the pullback across the aortic valve estimated MEAN GRADIENT 46 MMHG-SEVERE AORTIC STENOSIS Right Heart Pressures indicate Mild to Moderate Pulmonary Hypertension: PAP-mean 4615-27 mmHg; PCWP mean of 15 with a V wave up to 28 mmHg. LVEDP 14 mmHg.;  Cardiac Output-Index: (Fick) 7.0-3.19; (Thermodilution) 6.04-540  Successful Mynx closure of the RFA     RECOMMENDATIONS Discharge home from short stay with plans to continue follow-up with heart valve clinic to complete TAVR workup. Small  caliber RCA is 100% occluded-not a PCI target       Leonidus Rowand Lemma, MD     Recommendations       Antiplatelet/Anticoag Recommend Aspirin 81mg  daily for moderate CAD.  Discharge Date In the absence of any other complications or medical issues, we expect the patient to be ready for discharge from a cath perspective on 07/31/2022.    Indications   Hx of CABG [Z95.1 (ICD-10-CM)]  Coronary artery disease involving native coronary artery of native heart with angina pectoris (HCC) [I25.119 (ICD-10-CM)]  Severe aortic stenosis by prior echocardiogram [I35.0 (ICD-10-CM)]    Clinical Presentation   CHF/Shock Congestive heart failure not present. No shock present.    Procedural Details   Technical Details PCP:  Alysia Penna, MD              [facility] Cardiologist:  Kristeen Miss, MD    TAVR Team: Dr. Excell Seltzer, Dr. Laneta Simmers, Cline Crock, PA  Bradley Soto is a 82 y.o. male presenting for evaluation of severe aortic stenosis, referred by Jari Favre, PA, and Dr. Elease Hashimoto.  The patient has a complex medical history.  He has undergone coronary stenting in the past.  In 2020 he began to develop progressive chest discomfort and ultimately underwent cardiac catheterization and was found to have severe multivessel coronary artery disease.  He was treated with multivessel CABG in 2020 with a LIMA to LAD graft, saphenous vein graft to ramus intermedius and OM 3, and saphenous vein graft to diagonal.  The distal right coronary artery branches were too small for bypass grafting.  The patient has gone on to develop progressive kidney disease and now end-stage renal disease.  He has reported progressive shortness of breath that his office visits with Dr. Elease Hashimoto.  Significant comorbidities have been felt to contribute including anemia of chronic disease.  He has not been anticoagulated for paroxysmal atrial fibrillation because of his chronic anemia and comorbid conditions.  He recently underwent an  echocardiogram which demonstrated progressive aortic stenosis now in the moderate to severe range and he was referred to Dr. Excell Seltzer for evaluation for possible TAVR.  He now presents for right neph heart cath as part of pre-TAVR workup.  Time Out: Verified patient identification, verified procedure, site/side was marked, verified correct patient position, special equipment/implants available, medications/allergies/relevent history reviewed, required imaging and test results available. Performed.  Access:  * Direct ultrasound guidance used for both arterial & venous access.  Permanent image obtained and placed on chart. * RIGHT Common Femoral Artery: 5 Fr Sheath - fluoroscopically guided modified Seldinger Technique  * RIGHT Common Femoral Vein: 7 Fr Sheath - Seldinger Technique..  Right Heart Catheterization: 7 Fr Theone Murdoch catheter advanced under fluoroscopy with balloon inflated to the RA, RV, then PCWP-PA for hemodynamic measurement.  * Simultaneous FA &  PA blood gases checked for SaO2% to calculate FICK CO/CI  * Thermodilution Injections performed to calculate CO/CI  * Catheter removed completely out of the body with balloon deflated.  Left Heart Catheterization: 5Fr Catheters advanced or exchanged over a J-wire under direct fluoroscopic guidance into the ascending aorta; JL4 catheter advanced first.  * LV Hemodynamics (no LV Gram): JR4 catheter crossed the aortic valve with J-wire. -->  The initial crossing of the catheter led to patient going into a narrow complex tachycardia followed by paced rhythm, he then broke into a regular rhythm.  Unfortunately I did pull the catheter back because of the potential concern for tachycardia.  On recross and the wire the patient remained in sinus rhythm until the catheter was pulled back across the valve and then he went into a paced rhythm. * Left Coronary Artery Cineangiography: JL 4 catheter  * Right Coronary Artery, SVG-RCA & SVG-OM Cineangiography:  JR4 catheter  * SeqSVG-RI-OM3 & SVG-D2/Free LIMA-LAD Cineangiography: AL1 catheter  The JR4 catheter was redirected into the subclavian artery, and there was no obvious LIMA.  On detailed reading of the CABG report, it was apparent that the LIMA was taken down as a free LIMA and sewn to the SVG-diagonal hood.   Upon completion of Angiogaphy, the catheter was removed completely out of the body over a wire, without complication.  Femoral arterial sheath was removed in the Cath Lab with Mynx closure device deployed and brief manual hemostasis. Femoral venous sheath was removed in the Cath Lab with manual pressure held for hemostasis.    MEDICATIONS * SQ Lidocaine 20 mL mL * Isovue Contrast: Total 90mL Estimated blood loss <50 mL.   During this procedure medications were administered to achieve and maintain moderate conscious sedation while the patient's heart rate, blood pressure, and oxygen saturation were continuously monitored and I was present face-to-face 100% of this time.    Medications (Filter: Administrations occurring from 0725 to 0903 on 07/31/22)  important  Continuous medications are totaled by the amount administered until 07/31/22 0903.    lidocaine (PF) (XYLOCAINE) 1 % injection (mL)  Total volume: 10 mL Date/Time Rate/Dose/Volume Action    07/31/22 0755 10 mL Given    Heparin (Porcine) in NaCl 1000-0.9 UT/500ML-% SOLN (mL)  Total volume: 1,000 mL Date/Time Rate/Dose/Volume Action    07/31/22 0818 500 mL Given    0818 500 mL Given    midazolam (VERSED) injection (mg)  Total dose: 1 mg Date/Time Rate/Dose/Volume Action    07/31/22 0818 1 mg Given    iohexol (OMNIPAQUE) 350 MG/ML injection (mL)  Total volume: 90 mL Date/Time Rate/Dose/Volume Action    07/31/22 0847 90 mL Given      Sedation Time   Sedation Time Physician-1: 25 minutes 15 seconds Contrast        Administrations occurring from 0725 to 0903 on 07/31/22:  Medication Name Total Dose  iohexol  (OMNIPAQUE) 350 MG/ML injection 90 mL    Radiation/Fluoro   Fluoro time: 13.3 (min) DAP: 28414 (mGycm2) Cumulative Air Kerma: 484 (mGy) Complications   Complications documented before study signed (07/31/2022  9:50 AM)    No complications were associated with this study.  Documented by Erenest Rasher, RN - 07/31/2022  8:47 AM      Coronary Findings   Diagnostic Dominance: Co-dominant Left Main  Vessel was injected. Vessel is normal in caliber.    Left Anterior Descending  Vessel is moderate in size.  Prox LAD to Mid LAD lesion is  85% stenosed.  Mid LAD lesion is 100% stenosed with 100% stenosed side branch in 2nd Diag.    First Diagonal Branch  Vessel is small in size.    Second Diagonal Branch  Vessel is moderate in size.    Third Diagonal Branch  Vessel is small in size.    Ramus Intermedius  Ramus lesion is 100% stenosed. The lesion is chronically occluded. The lesion was previously treated using a drug eluting stent over 2 years ago. Previously placed stent displays restenosis.    Left Circumflex  Vessel is normal in caliber.  Prox Cx to Mid Cx lesion is 95% stenosed with 85% stenosed side branch in LPAV. The lesion was previously treated using a drug eluting stent over 2 years ago. Proximal edge of previous stent Previously placed stent displays restenosis.  Dist Cx lesion is 100% stenosed with 100% stenosed side branch in 2nd Mrg. The lesion is chronically occluded. The lesion was previously treated using a drug eluting stent over 2 years ago. Previously placed stent displays restenosis.    First Obtuse Marginal Branch  Vessel is small in size.  1st Mrg lesion is 99% stenosed. The lesion was previously treated using a drug eluting stent over 2 years ago. Previously placed stent displays restenosis.    First Left Posterolateral Branch  Vessel is small in size.    Second Left Posterolateral Branch  Vessel is small in size.    Right Coronary Artery  Vessel was  injected. Vessel is small. There is severe diffuse disease throughout the vessel.  Ost RCA to Mid RCA lesion is 100% stenosed. The lesion is chronically occluded.    Right Posterior Descending Artery  Vessel is small in size. There is severe disease in the vessel.  Collaterals  RPDA filled by collaterals from 1st Sept.     Collaterals  RPDA filled by collaterals from 2nd Sept.       First Right Posterolateral Branch  There is moderate disease in the vessel.    Saphenous Graft To 2nd Diag  SVG graft was visualized by angiography and is normal in caliber. The graft exhibits no disease. The flow is not reversed. SVG-D2    LIMA Y-graft Branch To Mid LAD  LIMA graft was visualized by angiography and is normal in caliber. The flow is not reversed. LIMA-LAD    Sequential Jump Graft Graft To Ramus, 2nd Mrg  Seq SVG- Ramus-3rdMrg graft was visualized by angiography and is normal in caliber. The graft exhibits no disease. The flow is not reversed.    Intervention    No interventions have been documented.    Right Heart   Right Heart Pressures Hemodynamic findings consistent with mild pulmonary hypertension. PAP-mean 46/15-27 mmHg PCWP 15 mmHg  Mean -> V wave 28 mmHg LV EDP is normal. LV P-EDP 170/4-14 mmHg. AO P-mean 112/49-73 mmHg Ao sat 94%, PA sat 63%. Cardiac Output / Index: (Fick) 7/3.19; (Thermal) 6.9/3.17  Right Atrium Right atrial pressure is normal. RAP mean 4-7 mmHg  Right Ventricle RVP-EDP 43/0-4 mmHg    Wall Motion   No LV gram           Left Heart   Aortic Valve There is severe aortic valve stenosis. The aortic valve is calcified. Peak to peak gradient 61 mmHg, mean gradient 46.9.    Coronary Diagrams   Diagnostic Dominance: Co-dominant  Intervention    Implants       Vascular Products   Closure Mynx Control 99f - [id number] -  Implanted  Inventory item: CLOSURE [facility] CONTROL 50F Model/Cat number: [id number]  Manufacturer: [employer] Lot number:  [id number]  Device identifier: [id number] Device identifier type: GS1  GUDID Information         Request status Successful      Brand name: MYNX CONTROL Version/Model: [id number]  Company name: [employer] MRI safety info as of 07/31/22: MR Safe  Contains dry or latex rubber: No      GMDN P.T. name: Wound hydrogel dressing, non-antimicrobial      As of 07/31/2022   Status: Implanted          Syngo Images    Show images for CARDIAC CATHETERIZATION Images on Long Term Storage    Show images for Daryel Gerald Link to Procedure Log   Procedure Log    Hemo Data   Flowsheet Row Most Recent Value  Fick Cardiac Output 7 L/min  Fick Cardiac Output Index 3.19 (L/min)/BSA  Thermal Cardiac Output 6.97 L/min  Thermal Cardiac Output Index 3.17 (L/min)/BSA  Aortic Mean Gradient 46.88 mmHg  Aortic Peak Gradient 61.1 mmHg  Aortic Valve Area 0.87  Aortic Value Area Index 0.39 cm2/BSA  RA A Wave 7 mmHg  RA V Wave 7 mmHg  RA Mean 4 mmHg  RV Systolic Pressure 43 mmHg  RV Diastolic Pressure -1 mmHg  RV EDP 4 mmHg  PA Systolic Pressure 46 mmHg  PA Diastolic Pressure 15 mmHg  PA Mean 27 mmHg  PW A Wave 15 mmHg  PW V Wave 28 mmHg  PW Mean 15 mmHg  AO Systolic Pressure 129 mmHg  AO Diastolic Pressure 53 mmHg  AO Mean 83 mmHg  LV Systolic Pressure 160 mmHg  LV Diastolic Pressure 4 mmHg  LV EDP 12 mmHg  AOp Systolic Pressure 112 mmHg  AOp Diastolic Pressure 49 mmHg  AOp Mean Pressure 73 mmHg  LVp Systolic Pressure 173 mmHg  LVp Diastolic Pressure 6 mmHg  LVp EDP Pressure 14 mmHg  TPVR Index 8.51 HRUI  TSVR Index 26.17 HRUI  PVR SVR Ratio 0.15  TPVR/TSVR Ratio 0.33      Impression:   This 82 year old gentleman has stage D3, severe, paradoxical low-flow/low gradient aortic stenosis with NYHA class III symptoms of exertional fatigue and shortness of breath consistent with chronic diastolic congestive heart failure.  I have personally reviewed his 2D echocardiogram, cardiac  catheterization, and CTA studies.  His echocardiogram shows a calcified and thickened aortic valve with restricted leaflet mobility.  The mean gradient was 27 mmHg with a peak gradient of 47 mmHg and a valve area by VTI of 0.87 cm.  Dimensionless index was 0.25 with a stroke-volume index of 30.  Left ventricular ejection fraction was 55 to 60% with grade 2 diastolic dysfunction.  Cardiac catheterization showed severe native three-vessel coronary disease with 4/4 patent grafts.  The mean gradient across aortic valve was measured at 46 mmHg.  I agree that aortic valve replacement is indicated in this patient for relief of his worsening symptoms and to prevent left ventricular deterioration.  Given his advanced age, prior coronary bypass surgery, and end-stage renal disease on hemodialysis I think that TAVR would be the best option for treating him.  His gated cardiac CTA shows anatomy suitable for TAVR using a 26 mm SAPIEN 3 valve.  His abdominal and pelvic CTA shows adequate pelvic vascular anatomy to allow transfemoral insertion.   The patient and his wife were counseled at length regarding treatment alternatives for management of severe  symptomatic aortic stenosis. The risks and benefits of surgical intervention has been discussed in detail. Long-term prognosis with medical therapy was discussed. Alternative approaches such as conventional surgical aortic valve replacement, transcatheter aortic valve replacement, and palliative medical therapy were compared and contrasted at length. This discussion was placed in the context of the patient's own specific clinical presentation and past medical history. All of their questions have been addressed.    Following the decision to proceed with transcatheter aortic valve replacement, a discussion was held regarding what types of management strategies would be attempted intraoperatively in the event of life-threatening complications, including whether or not the patient  would be considered a candidate for the use of cardiopulmonary bypass and/or conversion to open sternotomy for attempted surgical intervention.  I do not think he is a candidate for emergent sternotomy or cardiopulmonary bypass given his advanced age, prior coronary bypass surgery, and end-stage renal disease.  The patient has been advised of a variety of complications that might develop including but not limited to risks of death, stroke, paravalvular leak, aortic dissection or other major vascular complications, aortic annulus rupture, device embolization, cardiac rupture or perforation, mitral regurgitation, acute myocardial infarction, arrhythmia, heart block or bradycardia requiring permanent pacemaker placement, congestive heart failure, respiratory failure, renal failure, pneumonia, infection, other late complications related to structural valve deterioration or migration, or other complications that might ultimately cause a temporary or permanent loss of functional independence or other long term morbidity. The patient provides full informed consent for the procedure as described and all questions were answered.   His chest CTA also shows a small part solid nodule in the left upper lobe of the lung which is stable compared to his recent CT of the chest on 06/05/2022.  The small solid component is new compared to his prior CT scan in October 2020.  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   Plan:   Transfemoral TAVR using a SAPIEN 3 valve.       Alleen Borne, MD

## 2022-08-24 NOTE — Progress Notes (Signed)
Alejandro Mulling, PA-C with Cardiology notified [employer] Rep July 8th around 12:00pm that the rep will need to be at the bedside tomorrow, July 9th for patients planned TAVR. Rep will interrogate device prior to surgery and is aware that pt is first case.  Cline Crock and Julieta Gutting with Structural Heart Team aware as well.

## 2022-08-25 ENCOUNTER — Inpatient Hospital Stay (HOSPITAL_COMMUNITY): Payer: Medicare Other

## 2022-08-25 ENCOUNTER — Encounter (HOSPITAL_COMMUNITY): Payer: Self-pay | Admitting: Internal Medicine

## 2022-08-25 ENCOUNTER — Other Ambulatory Visit: Payer: Self-pay

## 2022-08-25 ENCOUNTER — Other Ambulatory Visit: Payer: Self-pay | Admitting: Physician Assistant

## 2022-08-25 ENCOUNTER — Inpatient Hospital Stay (HOSPITAL_COMMUNITY)
Admission: RE | Admit: 2022-08-25 | Discharge: 2022-08-26 | DRG: 266 | Disposition: A | Discharge status: Home / Self Care | Payer: Medicare Other | Attending: Internal Medicine | Admitting: Internal Medicine

## 2022-08-25 ENCOUNTER — Encounter (HOSPITAL_COMMUNITY)
Admission: RE | Disposition: A | Discharge status: Home / Self Care | Payer: Self-pay | Source: Home / Self Care | Attending: Internal Medicine

## 2022-08-25 ENCOUNTER — Inpatient Hospital Stay (HOSPITAL_COMMUNITY): Payer: Medicare Other | Admitting: Physician Assistant

## 2022-08-25 ENCOUNTER — Inpatient Hospital Stay (HOSPITAL_COMMUNITY): Payer: Medicare Other | Admitting: Certified Registered Nurse Anesthetist

## 2022-08-25 DIAGNOSIS — E785 Hyperlipidemia, unspecified: Secondary | ICD-10-CM | POA: Diagnosis present

## 2022-08-25 DIAGNOSIS — I495 Sick sinus syndrome: Secondary | ICD-10-CM | POA: Diagnosis present

## 2022-08-25 DIAGNOSIS — I480 Paroxysmal atrial fibrillation: Secondary | ICD-10-CM | POA: Diagnosis present

## 2022-08-25 DIAGNOSIS — I252 Old myocardial infarction: Secondary | ICD-10-CM

## 2022-08-25 DIAGNOSIS — M109 Gout, unspecified: Secondary | ICD-10-CM | POA: Diagnosis present

## 2022-08-25 DIAGNOSIS — Z006 Encounter for examination for normal comparison and control in clinical research program: Secondary | ICD-10-CM | POA: Diagnosis not present

## 2022-08-25 DIAGNOSIS — Z87891 Personal history of nicotine dependence: Secondary | ICD-10-CM | POA: Diagnosis not present

## 2022-08-25 DIAGNOSIS — I350 Nonrheumatic aortic (valve) stenosis: Secondary | ICD-10-CM | POA: Diagnosis not present

## 2022-08-25 DIAGNOSIS — I351 Nonrheumatic aortic (valve) insufficiency: Secondary | ICD-10-CM | POA: Diagnosis not present

## 2022-08-25 DIAGNOSIS — Z992 Dependence on renal dialysis: Secondary | ICD-10-CM | POA: Diagnosis not present

## 2022-08-25 DIAGNOSIS — N186 End stage renal disease: Secondary | ICD-10-CM

## 2022-08-25 DIAGNOSIS — I120 Hypertensive chronic kidney disease with stage 5 chronic kidney disease or end stage renal disease: Secondary | ICD-10-CM

## 2022-08-25 DIAGNOSIS — Z8249 Family history of ischemic heart disease and other diseases of the circulatory system: Secondary | ICD-10-CM

## 2022-08-25 DIAGNOSIS — Z87442 Personal history of urinary calculi: Secondary | ICD-10-CM | POA: Diagnosis not present

## 2022-08-25 DIAGNOSIS — R911 Solitary pulmonary nodule: Secondary | ICD-10-CM | POA: Diagnosis present

## 2022-08-25 DIAGNOSIS — Z7982 Long term (current) use of aspirin: Secondary | ICD-10-CM

## 2022-08-25 DIAGNOSIS — G4733 Obstructive sleep apnea (adult) (pediatric): Secondary | ICD-10-CM | POA: Diagnosis present

## 2022-08-25 DIAGNOSIS — Z955 Presence of coronary angioplasty implant and graft: Secondary | ICD-10-CM

## 2022-08-25 DIAGNOSIS — Z79899 Other long term (current) drug therapy: Secondary | ICD-10-CM

## 2022-08-25 DIAGNOSIS — E1129 Type 2 diabetes mellitus with other diabetic kidney complication: Secondary | ICD-10-CM | POA: Diagnosis present

## 2022-08-25 DIAGNOSIS — Z9049 Acquired absence of other specified parts of digestive tract: Secondary | ICD-10-CM

## 2022-08-25 DIAGNOSIS — E1122 Type 2 diabetes mellitus with diabetic chronic kidney disease: Secondary | ICD-10-CM | POA: Diagnosis present

## 2022-08-25 DIAGNOSIS — I4891 Unspecified atrial fibrillation: Secondary | ICD-10-CM | POA: Diagnosis present

## 2022-08-25 DIAGNOSIS — Z794 Long term (current) use of insulin: Secondary | ICD-10-CM | POA: Diagnosis not present

## 2022-08-25 DIAGNOSIS — Z952 Presence of prosthetic heart valve: Secondary | ICD-10-CM

## 2022-08-25 DIAGNOSIS — Z91013 Allergy to seafood: Secondary | ICD-10-CM

## 2022-08-25 DIAGNOSIS — Z86010 Personal history of colonic polyps: Secondary | ICD-10-CM

## 2022-08-25 DIAGNOSIS — I2510 Atherosclerotic heart disease of native coronary artery without angina pectoris: Secondary | ICD-10-CM | POA: Diagnosis present

## 2022-08-25 DIAGNOSIS — Z951 Presence of aortocoronary bypass graft: Secondary | ICD-10-CM

## 2022-08-25 DIAGNOSIS — D649 Anemia, unspecified: Secondary | ICD-10-CM | POA: Diagnosis present

## 2022-08-25 DIAGNOSIS — Z950 Presence of cardiac pacemaker: Secondary | ICD-10-CM | POA: Diagnosis not present

## 2022-08-25 DIAGNOSIS — I10 Essential (primary) hypertension: Secondary | ICD-10-CM | POA: Diagnosis present

## 2022-08-25 HISTORY — DX: Nonrheumatic aortic (valve) stenosis: I35.0

## 2022-08-25 HISTORY — DX: End stage renal disease: N18.6

## 2022-08-25 HISTORY — PX: INTRAOPERATIVE TRANSTHORACIC ECHOCARDIOGRAM: SHX6523

## 2022-08-25 HISTORY — DX: Presence of prosthetic heart valve: Z95.2

## 2022-08-25 HISTORY — PX: TRANSCATHETER AORTIC VALVE REPLACEMENT, TRANSFEMORAL: SHX6400

## 2022-08-25 LAB — CBC
HCT: 28 % — ABNORMAL LOW (ref 39.0–52.0)
Hemoglobin: 8.9 g/dL — ABNORMAL LOW (ref 13.0–17.0)
MCH: 33.6 pg (ref 26.0–34.0)
MCHC: 31.8 g/dL (ref 30.0–36.0)
MCV: 105.7 fL — ABNORMAL HIGH (ref 80.0–100.0)
Platelets: 106 10*3/uL — ABNORMAL LOW (ref 150–400)
RBC: 2.65 MIL/uL — ABNORMAL LOW (ref 4.22–5.81)
RDW: 17.4 % — ABNORMAL HIGH (ref 11.5–15.5)
WBC: 10.4 10*3/uL (ref 4.0–10.5)
nRBC: 0 % (ref 0.0–0.2)

## 2022-08-25 LAB — POCT I-STAT, CHEM 8
BUN: 33 mg/dL — ABNORMAL HIGH (ref 8–23)
Calcium, Ion: 1.24 mmol/L (ref 1.15–1.40)
Chloride: 99 mmol/L (ref 98–111)
Creatinine, Ser: 5.7 mg/dL — ABNORMAL HIGH (ref 0.61–1.24)
Glucose, Bld: 159 mg/dL — ABNORMAL HIGH (ref 70–99)
HCT: 21 % — ABNORMAL LOW (ref 39.0–52.0)
Hemoglobin: 7.1 g/dL — ABNORMAL LOW (ref 13.0–17.0)
Potassium: 4.1 mmol/L (ref 3.5–5.1)
Sodium: 140 mmol/L (ref 135–145)
TCO2: 30 mmol/L (ref 22–32)

## 2022-08-25 LAB — GLUCOSE, CAPILLARY
Glucose-Capillary: 122 mg/dL — ABNORMAL HIGH (ref 70–99)
Glucose-Capillary: 98 mg/dL (ref 70–99)
Glucose-Capillary: 229 mg/dL — ABNORMAL HIGH (ref 70–99)
Glucose-Capillary: 147 mg/dL — ABNORMAL HIGH (ref 70–99)

## 2022-08-25 LAB — BASIC METABOLIC PANEL
Anion gap: 15 (ref 5–15)
BUN: 40 mg/dL — ABNORMAL HIGH (ref 8–23)
CO2: 26 mmol/L (ref 22–32)
Calcium: 8.7 mg/dL — ABNORMAL LOW (ref 8.9–10.3)
Chloride: 97 mmol/L — ABNORMAL LOW (ref 98–111)
Creatinine, Ser: 6.28 mg/dL — ABNORMAL HIGH (ref 0.61–1.24)
GFR, Estimated: 8 mL/min — ABNORMAL LOW (ref >60)
Glucose, Bld: 190 mg/dL — ABNORMAL HIGH (ref 70–99)
Potassium: 4.8 mmol/L (ref 3.5–5.1)
Sodium: 138 mmol/L (ref 135–145)

## 2022-08-25 LAB — ECHOCARDIOGRAM LIMITED
AR max vel: 3.31 cm2
AV Area VTI: 3.37 cm2
AV Area mean vel: 3.19 cm2
AV Mean grad: 3 mmHg
AV Peak grad: 8.2 mmHg
Ao pk vel: 1.44 m/s
Calc EF: 39.8 %
Single Plane A2C EF: 47.3 %
Single Plane A4C EF: 41.2 %

## 2022-08-25 LAB — POCT ACTIVATED CLOTTING TIME: Activated Clotting Time: 318 seconds

## 2022-08-25 SURGERY — IMPLANTATION, AORTIC VALVE, TRANSCATHETER, FEMORAL APPROACH
Anesthesia: Monitor Anesthesia Care

## 2022-08-25 MED ORDER — TRAMADOL HCL 50 MG PO TABS: 50.0000 mg | ORAL_TABLET | Freq: Two times a day (BID) | ORAL | Status: DC | PRN

## 2022-08-25 MED ORDER — CHLORHEXIDINE GLUCONATE 4 % EX SOLN
60.0000 mL | Freq: Once | CUTANEOUS | Status: DC
  Filled 2022-08-25: qty 60

## 2022-08-25 MED ORDER — MIDAZOLAM HCL 2 MG/2ML IJ SOLN
INTRAMUSCULAR | Status: AC
  Filled 2022-08-25: qty 2

## 2022-08-25 MED ORDER — LACTATED RINGERS IV SOLN: INTRAVENOUS | Status: DC | PRN

## 2022-08-25 MED ORDER — SODIUM CHLORIDE 0.9% FLUSH: 3.0000 mL | INTRAVENOUS | Status: DC | PRN

## 2022-08-25 MED ORDER — ACETAMINOPHEN 325 MG PO TABS: 650.0000 mg | ORAL_TABLET | Freq: Four times a day (QID) | ORAL | Status: DC | PRN

## 2022-08-25 MED ORDER — ONDANSETRON HCL 4 MG/2ML IJ SOLN
INTRAMUSCULAR | Status: DC | PRN
  Administered 2022-08-25: 4 mg via INTRAVENOUS

## 2022-08-25 MED ORDER — ONDANSETRON HCL 4 MG/2ML IJ SOLN: 4.0000 mg | Freq: Four times a day (QID) | INTRAMUSCULAR | Status: DC | PRN

## 2022-08-25 MED ORDER — SODIUM CHLORIDE 0.9% FLUSH: 3.0000 mL | Freq: Two times a day (BID) | INTRAVENOUS | Status: DC

## 2022-08-25 MED ORDER — SEVELAMER CARBONATE 800 MG PO TABS
800.0000 mg | ORAL_TABLET | Freq: Three times a day (TID) | ORAL | Status: DC
  Administered 2022-08-25 – 2022-08-26 (×2): 800 mg via ORAL
  Filled 2022-08-25 (×2): qty 1

## 2022-08-25 MED ORDER — IOHEXOL 350 MG/ML SOLN
INTRAVENOUS | Status: DC | PRN
  Administered 2022-08-25: 50 mL

## 2022-08-25 MED ORDER — PROTAMINE SULFATE 10 MG/ML IV SOLN
INTRAVENOUS | Status: DC | PRN
  Administered 2022-08-25: 20 mg via INTRAVENOUS
  Administered 2022-08-25: 130 mg via INTRAVENOUS

## 2022-08-25 MED ORDER — MIDODRINE HCL 5 MG PO TABS: 5.0000 mg | ORAL_TABLET | ORAL | Status: DC

## 2022-08-25 MED ORDER — CHLORHEXIDINE GLUCONATE 0.12 % MT SOLN
15.0000 mL | Freq: Once | OROMUCOSAL | Status: AC
  Administered 2022-08-25: 15 mL via OROMUCOSAL
  Filled 2022-08-25 (×2): qty 15

## 2022-08-25 MED ORDER — SODIUM CHLORIDE 0.9 % IV SOLN: INTRAVENOUS | Status: DC | PRN

## 2022-08-25 MED ORDER — ACETAMINOPHEN 650 MG RE SUPP: 650.0000 mg | Freq: Four times a day (QID) | RECTAL | Status: DC | PRN

## 2022-08-25 MED ORDER — FENTANYL CITRATE (PF) 100 MCG/2ML IJ SOLN
INTRAMUSCULAR | Status: AC
  Filled 2022-08-25: qty 2

## 2022-08-25 MED ORDER — LIDOCAINE HCL (PF) 1 % IJ SOLN
INTRAMUSCULAR | Status: AC
  Filled 2022-08-25: qty 30

## 2022-08-25 MED ORDER — SODIUM CHLORIDE 0.9 % IV SOLN: 250.0000 mL | INTRAVENOUS | Status: DC | PRN

## 2022-08-25 MED ORDER — OXYCODONE HCL 5 MG PO TABS
5.0000 mg | ORAL_TABLET | ORAL | Status: DC | PRN
  Administered 2022-08-25 – 2022-08-26 (×2): 10 mg via ORAL
  Filled 2022-08-25 (×2): qty 2

## 2022-08-25 MED ORDER — CEFAZOLIN SODIUM-DEXTROSE 2-4 GM/100ML-% IV SOLN: 2.0000 g | Freq: Three times a day (TID) | INTRAVENOUS | Status: DC

## 2022-08-25 MED ORDER — ASPIRIN 81 MG PO TBEC
81.0000 mg | DELAYED_RELEASE_TABLET | Freq: Every morning | ORAL | Status: DC
  Administered 2022-08-26: 81 mg via ORAL
  Filled 2022-08-25: qty 1

## 2022-08-25 MED ORDER — CEFAZOLIN SODIUM-DEXTROSE 2-4 GM/100ML-% IV SOLN
2.0000 g | Freq: Once | INTRAVENOUS | Status: AC
  Administered 2022-08-26: 2 g via INTRAVENOUS
  Filled 2022-08-25: qty 100

## 2022-08-25 MED ORDER — NITROGLYCERIN IN D5W 200-5 MCG/ML-% IV SOLN: 0.0000 ug/min | INTRAVENOUS | Status: DC

## 2022-08-25 MED ORDER — CHLORHEXIDINE GLUCONATE 4 % EX SOLN
30.0000 mL | CUTANEOUS | Status: DC
  Filled 2022-08-25: qty 30

## 2022-08-25 MED ORDER — SODIUM CHLORIDE 0.9 % IV SOLN: INTRAVENOUS | Status: AC

## 2022-08-25 MED ORDER — SODIUM CHLORIDE 0.9 % IV SOLN: INTRAVENOUS | Status: DC

## 2022-08-25 MED ORDER — MORPHINE SULFATE (PF) 2 MG/ML IV SOLN: 1.0000 mg | INTRAVENOUS | Status: DC | PRN

## 2022-08-25 MED ORDER — GABAPENTIN 100 MG PO CAPS
200.0000 mg | ORAL_CAPSULE | Freq: Every day | ORAL | Status: DC
  Administered 2022-08-25: 200 mg via ORAL
  Filled 2022-08-25: qty 2

## 2022-08-25 MED ORDER — INSULIN ASPART 100 UNIT/ML IJ SOLN
0.0000 [IU] | Freq: Three times a day (TID) | INTRAMUSCULAR | Status: DC
  Administered 2022-08-25: 8 [IU] via SUBCUTANEOUS
  Administered 2022-08-25: 2 [IU] via SUBCUTANEOUS

## 2022-08-25 MED ORDER — HEPARIN SODIUM (PORCINE) 1000 UNIT/ML IJ SOLN
INTRAMUSCULAR | Status: DC | PRN
  Administered 2022-08-25: 15000 [IU] via INTRAVENOUS

## 2022-08-25 MED ORDER — PROPOFOL 500 MG/50ML IV EMUL
INTRAVENOUS | Status: DC | PRN
  Administered 2022-08-25: 50 ug/kg/min via INTRAVENOUS

## 2022-08-25 MED ORDER — INSULIN ASPART 100 UNIT/ML IJ SOLN: 0.0000 [IU] | INTRAMUSCULAR | Status: DC | PRN

## 2022-08-25 MED ORDER — LIDOCAINE HCL (PF) 1 % IJ SOLN
INTRAMUSCULAR | Status: DC | PRN
  Administered 2022-08-25 (×2): 10 mL

## 2022-08-25 MED ORDER — MIDAZOLAM HCL 5 MG/5ML IJ SOLN
INTRAMUSCULAR | Status: DC | PRN
  Administered 2022-08-25: .5 mg via INTRAVENOUS

## 2022-08-25 MED ORDER — FENTANYL CITRATE (PF) 250 MCG/5ML IJ SOLN
INTRAMUSCULAR | Status: DC | PRN
  Administered 2022-08-25: 50 ug via INTRAVENOUS

## 2022-08-25 SURGICAL SUPPLY — 27 items
BAG SNAP BAND KOVER 36X36 (MISCELLANEOUS) ×2 IMPLANT
CABLE SURGICAL S-101-97-12 (CABLE) IMPLANT
CATH 26 ULTRA DELIVERY (CATHETERS) IMPLANT
CATH DIAG 6FR PIGTAIL ANGLED (CATHETERS) IMPLANT
CATH INFINITI 5FR ANG PIGTAIL (CATHETERS) IMPLANT
CATH INFINITI 6F AL1 (CATHETERS) IMPLANT
CLOSURE MYNX CONTROL 6F/7F (Vascular Products) IMPLANT
CLOSURE PERCLOSE PROSTYLE (VASCULAR PRODUCTS) IMPLANT
CRIMPER (MISCELLANEOUS) IMPLANT
DEVICE INFLATION ATRION QL2530 (MISCELLANEOUS) IMPLANT
KIT HEART LEFT (KITS) ×1 IMPLANT
KIT SAPIAN 3 ULTRA RESILIA 26 (Valve) IMPLANT
PACK CARDIAC CATHETERIZATION (CUSTOM PROCEDURE TRAY) ×1 IMPLANT
SHEATH INTRODUCER SET 20-26 (SHEATH) IMPLANT
SHEATH PINNACLE 6F 10CM (SHEATH) IMPLANT
SHEATH PINNACLE 8F 10CM (SHEATH) IMPLANT
SHEATH PROBE COVER 6X72 (BAG) IMPLANT
STOPCOCK MORSE 400PSI 3WAY (MISCELLANEOUS) ×2 IMPLANT
SYR MEDRAD MARK 7 150ML (SYRINGE) IMPLANT
TRANSDUCER W/STOPCOCK (MISCELLANEOUS) ×2 IMPLANT
TUBING ART PRESS 72 MALE/FEM (TUBING) IMPLANT
WIRE AMPLATZ SS-J .035X180CM (WIRE) IMPLANT
WIRE EMERALD 3MM-J .035X150CM (WIRE) IMPLANT
WIRE EMERALD 3MM-J .035X260CM (WIRE) IMPLANT
WIRE EMERALD ST .035X260CM (WIRE) IMPLANT
WIRE MICRO SET SILHO 5FR 7 (SHEATH) IMPLANT
WIRE SAFARI SM CURVE 275 (WIRE) IMPLANT

## 2022-08-25 NOTE — Anesthesia Preprocedure Evaluation (Addendum)
Anesthesia Evaluation  Patient identified by MRN, date of birth, ID band Patient awake    Reviewed: Allergy & Precautions, H&P , NPO status , Patient's Chart, lab work & pertinent test results  Airway Mallampati: II  TM Distance: >3 FB Neck ROM: Full    Dental no notable dental hx.    Pulmonary sleep apnea and Continuous Positive Airway Pressure Ventilation , former smoker   Pulmonary exam normal breath sounds clear to auscultation       Cardiovascular hypertension, + CAD, + Past MI and + Cardiac Stents  + pacemaker + Valvular Problems/Murmurs AS  Rhythm:Regular Rate:Normal + Systolic murmurs 1. Left ventricular ejection fraction, by estimation, is 55 to 60%. The  left ventricle has normal function. The left ventricle has no regional  wall motion abnormalities. There is mild asymmetric left ventricular  hypertrophy of the septal segment. Left  ventricular diastolic parameters are consistent with Grade II diastolic  dysfunction (pseudonormalization). Elevated left ventricular end-diastolic  pressure. The average left ventricular global longitudinal strain is -15.3  %. The global longitudinal  strain is abnormal.   2. Right ventricular systolic function is normal. The right ventricular  size is mildly enlarged. There is normal pulmonary artery systolic  pressure.   3. The mitral valve is normal in structure. Trivial mitral valve  regurgitation. No evidence of mitral stenosis.   4. The aortic valve is calcified. Aortic valve regurgitation is trivial.  Moderate to severe aortic valve stenosis. Aortic regurgitation PHT  measures 589 msec. Aortic valve area, by VTI measures 0.87 cm. Aortic  valve mean gradient measures 27.0 mmHg.  Aortic valve Vmax measures 3.44 m/s.   5. There is mild dilatation of the ascending aorta, measuring 38 mm.   6. The inferior vena cava is normal in size with greater than 50%  respiratory variability,  suggesting right atrial pressure of 3 mmHg.      Neuro/Psych negative neurological ROS  negative psych ROS   GI/Hepatic negative GI ROS, Neg liver ROS,,,  Endo/Other  diabetes, Type 2    Renal/GU DialysisRenal disease  negative genitourinary   Musculoskeletal negative musculoskeletal ROS (+)    Abdominal   Peds negative pediatric ROS (+)  Hematology  (+) Blood dyscrasia, anemia   Anesthesia Other Findings   Reproductive/Obstetrics negative OB ROS                             Anesthesia Physical Anesthesia Plan  ASA: 4  Anesthesia Plan: MAC   Post-op Pain Management: Minimal or no pain anticipated   Induction: Intravenous  PONV Risk Score and Plan: 2 and Propofol infusion  Airway Management Planned: Simple Face Mask  Additional Equipment:   Intra-op Plan:   Post-operative Plan:   Informed Consent: I have reviewed the patients History and Physical, chart, labs and discussed the procedure including the risks, benefits and alternatives for the proposed anesthesia with the patient or authorized representative who has indicated his/her understanding and acceptance.     Dental advisory given  Plan Discussed with: CRNA and Surgeon  Anesthesia Plan Comments:        Anesthesia Quick Evaluation

## 2022-08-25 NOTE — Progress Notes (Signed)
   08/25/22 2000  BiPAP/CPAP/SIPAP  BiPAP/CPAP/SIPAP Pt Type Adult  BiPAP/CPAP/SIPAP DREAMSTATIOND  Mask Type Nasal pillows  EPAP 12 cmH2O (as pe rpt's home settings)  Patient Home Equipment No  Auto Titrate No   Pt on cpap w/his home settings entered in, resting comfortably

## 2022-08-25 NOTE — Progress Notes (Signed)
  [facility]   MULTIDISCIPLINARY HEART VALVE TEAM  Patient doing well s/p TAVR. He is hemodynamically stable. Groin sites stable. ECG with paced rhythm. Plan to DC arterial line and transfer to 4E. Plan for early ambulation after bedrest completed and hopeful discharge over the next 24-48 hours.   Glayds Insco PA-C  [facility]  Pager [phone]  

## 2022-08-25 NOTE — Progress Notes (Signed)
Mobility Specialist Progress Note:   08/25/22 1632  Therapy Vitals  Temp (!) 97.5 F (36.4 C)  Temp Source Oral  Resp 20  BP (!) 125/57  Patient Position (if appropriate) Lying  Mobility  Activity Ambulated with assistance in hallway  Level of Assistance Contact guard assist, steadying assist  Assistive Device Front wheel walker  Distance Ambulated (ft) 200 ft  Activity Response Tolerated well  Mobility Referral Yes  $Mobility charge 1 Mobility  Mobility Specialist Start Time (ACUTE ONLY) 1620  Mobility Specialist Stop Time (ACUTE ONLY) 1634  Mobility Specialist Time Calculation (min) (ACUTE ONLY) 14 min    Pre Mobility: 72 HR,  125/57 BP,  95% SpO2 Post Mobility:  63 HR  Pt received in bed, agreeable to mobility. Mod I for bed mobility and STS. Ambulated in hallway w/ RW and contact guard. No c/o and asymptomatic throughout. Pt left on EOB with call bell. RN and NT present.  D'Vante Earlene Plater Mobility Specialist Please contact via Special educational needs teacher or Rehab office at [phone]

## 2022-08-25 NOTE — Progress Notes (Signed)
  Echocardiogram 2D Echocardiogram has been performed.  Bradley Soto 08/25/2022, 9:02 AM

## 2022-08-25 NOTE — Plan of Care (Signed)
  Problem: Activity: Goal: Ability to return to baseline activity level will improve Outcome: Progressing   

## 2022-08-25 NOTE — Interval H&P Note (Signed)
History and Physical Interval Note:  08/25/2022 6:23 AM  Bradley Soto  has presented today for surgery, with the diagnosis of Severe Aortic Stenosis.  The various methods of treatment have been discussed with the patient and family. After consideration of risks, benefits and other options for treatment, the patient has consented to  Procedure(s): Transcatheter Aortic Valve Replacement, Transfemoral (N/A) INTRAOPERATIVE TRANSTHORACIC ECHOCARDIOGRAM (N/A) as a surgical intervention.  The patient's history has been reviewed, patient examined, no change in status, stable for surgery.  I have reviewed the patient's chart and labs.  Questions were answered to the patient's satisfaction.     Alleen Borne

## 2022-08-25 NOTE — Progress Notes (Signed)
Called to see if patient might need dialysis here S/P TAVR done here today by Dr's Alfonse Alpers and Croitoru. Usual HD schedule is TTS but he did OP dialysis yesterday in place of today. Denies any SOB, lungs are clear, no sig edema. No labs done today. Will order bmet. Don't see any indication for dialysis at this time. Next scheduled HD is due on Thursday. Will forego formal consultation at this time and will follow along peripherally.   OP HD: [facility]  TTS  4h  96.5kg   400500  2/2.5 bath  RUA AVF  Heparin pend - last OP HD off schedule yesterday 7/8, post wt 99.9kg  Bradley Moselle  MD  [facility] 08/25/2022, 3:57 PM  Recent Labs  Lab 08/21/22 1357  HGB 9.4*  ALBUMIN 3.0*  CALCIUM 9.2  CREATININE 6.43*  K 4.0    Inpatient medications:  [START ON 08/26/2022] aspirin EC  81 mg Oral q AM   gabapentin  200 mg Oral QHS   insulin aspart  0-24 Units Subcutaneous TID AC & HS   [START ON 08/27/2022] midodrine  5 mg Oral Q T,Th,Sa-HD   sevelamer carbonate  800 mg Oral TID WC   sodium chloride flush  3 mL Intravenous Q12H    sodium chloride 50 mL/hr at 08/25/22 1503   sodium chloride     [START ON 08/26/2022]  ceFAZolin (ANCEF) IV     nitroGLYCERIN     sodium chloride, acetaminophen **OR** acetaminophen, morphine injection, ondansetron (ZOFRAN) IV, oxyCODONE, sodium chloride flush, traMADol

## 2022-08-25 NOTE — Transfer of Care (Signed)
Immediate Anesthesia Transfer of Care Note  Patient: Bradley Soto  Procedure(s) Performed: Transcatheter Aortic Valve Replacement, Transfemoral INTRAOPERATIVE TRANSTHORACIC ECHOCARDIOGRAM  Patient Location: Cath Lab  Anesthesia Type:MAC  Level of Consciousness: drowsy  Airway & Oxygen Therapy: Patient Spontanous Breathing and Patient connected to face mask oxygen  Post-op Assessment: Report given to RN and Post -op Vital signs reviewed and stable  Post vital signs: Reviewed and stable  Last Vitals:  Vitals Value Taken Time  BP 117/56   Temp    Pulse 61 08/25/22 0924  Resp 15 08/25/22 0924  SpO2 100 % 08/25/22 0924  Vitals shown include unvalidated device data.  Last Pain:  Vitals:   08/25/22 0615  PainSc: 0-No pain         Complications: No notable events documented.

## 2022-08-25 NOTE — Discharge Instructions (Signed)

## 2022-08-25 NOTE — Progress Notes (Signed)
Pt admitted to rm 3 from cath lab. Initiated tele. Oriented pt to the unit. VSS. Call bell within reach.   Lawson Radar, RN

## 2022-08-25 NOTE — Op Note (Signed)
[facility]   MULTIDISCIPLINARY HEART VALVE TEAM   TAVR OPERATIVE NOTE   Date of Procedure:  08/25/2022  Preoperative Diagnosis: Severe Aortic Stenosis   Postoperative Diagnosis: Same   Procedure:   Transcatheter Aortic Valve Replacement - Percutaneous Right Transfemoral Approach  Edwards Sapien 3 Ultra Resilia THV (size 26 mm, model # 9755RSL, serial # [id number])   Co-Surgeons:  Alleen Borne, MD and Alverda Skeans, MD   Anesthesiologist:  G. Okey Dupre, MD  Echocardiographer:  Judie Petit. Croitoru, MD  Pre-operative Echo Findings: Severe aortic stenosis Moderate left ventricular systolic dysfunction  Post-operative Echo Findings: No paravalvular leak Unchanged left ventricular systolic function   BRIEF CLINICAL NOTE AND INDICATIONS FOR SURGERY  This 82 year old gentleman has stage D3, severe, paradoxical low-flow/low gradient aortic stenosis with NYHA class III symptoms of exertional fatigue and shortness of breath consistent with chronic diastolic congestive heart failure.  I have personally reviewed his 2D echocardiogram, cardiac catheterization, and CTA studies.  His echocardiogram shows a calcified and thickened aortic valve with restricted leaflet mobility.  The mean gradient was 27 mmHg with a peak gradient of 47 mmHg and a valve area by VTI of 0.87 cm.  Dimensionless index was 0.25 with a stroke-volume index of 30.  Left ventricular ejection fraction was 55 to 60% with grade 2 diastolic dysfunction.  Cardiac catheterization showed severe native three-vessel coronary disease with 4/4 patent grafts.  The mean gradient across aortic valve was measured at 46 mmHg.  I agree that aortic valve replacement is indicated in this patient for relief of his worsening symptoms and to prevent left ventricular deterioration.  Given his advanced age, prior coronary bypass surgery, and end-stage renal disease on hemodialysis I think that TAVR would be the best option for treating him.  His  gated cardiac CTA shows anatomy suitable for TAVR using a 26 mm SAPIEN 3 valve.  His abdominal and pelvic CTA shows adequate pelvic vascular anatomy to allow transfemoral insertion.   The patient and his wife were counseled at length regarding treatment alternatives for management of severe symptomatic aortic stenosis. The risks and benefits of surgical intervention has been discussed in detail. Long-term prognosis with medical therapy was discussed. Alternative approaches such as conventional surgical aortic valve replacement, transcatheter aortic valve replacement, and palliative medical therapy were compared and contrasted at length. This discussion was placed in the context of the patient's own specific clinical presentation and past medical history. All of their questions have been addressed.    Following the decision to proceed with transcatheter aortic valve replacement, a discussion was held regarding what types of management strategies would be attempted intraoperatively in the event of life-threatening complications, including whether or not the patient would be considered a candidate for the use of cardiopulmonary bypass and/or conversion to open sternotomy for attempted surgical intervention.  I do not think he is a candidate for emergent sternotomy or cardiopulmonary bypass given his advanced age, prior coronary bypass surgery, and end-stage renal disease.  The patient has been advised of a variety of complications that might develop including but not limited to risks of death, stroke, paravalvular leak, aortic dissection or other major vascular complications, aortic annulus rupture, device embolization, cardiac rupture or perforation, mitral regurgitation, acute myocardial infarction, arrhythmia, heart block or bradycardia requiring permanent pacemaker placement, congestive heart failure, respiratory failure, renal failure, pneumonia, infection, other late complications related to structural valve  deterioration or migration, or other complications that might ultimately cause a temporary or permanent loss of functional  independence or other long term morbidity. The patient provides full informed consent for the procedure as described and all questions were answered.   His chest CTA also shows a small part solid nodule in the left upper lobe of the lung which is stable compared to his recent CT of the chest on 06/05/2022.  The small solid component is new compared to his prior CT scan in October 2020.  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      DETAILS OF THE OPERATIVE PROCEDURE  PREPARATION:    The patient was brought to the operating room on the above mentioned date and appropriate monitoring was established by the anesthesia team. The patient was placed in the supine position on the operating table.  Intravenous antibiotics were administered. The patient was monitored closely throughout the procedure under conscious sedation.   Baseline transthoracic echocardiogram was performed. The patient's abdomen and both groins were prepped and draped in a sterile manner. A time out procedure was performed.   PERIPHERAL ACCESS:    Using the modified Seldinger technique, femoral arterial access was obtained with placement of a 6 Fr sheaths on the left side.  A pigtail diagnostic catheter was passed through the left arterial sheath under fluoroscopic guidance into the aortic root.  Aortic root angiography was performed in order to determine the optimal angiographic angle for valve deployment.   TRANSFEMORAL ACCESS:   Percutaneous transfemoral access and sheath placement was performed using ultrasound guidance.  The right common femoral artery was cannulated using a micropuncture needle and appropriate location was verified using hand injection angiogram.  A pair of Abbott Perclose percutaneous closure devices  were placed and a 6 French sheath replaced into the femoral artery.  The patient was heparinized systemically and ACT verified > 250 seconds.    A 14 Fr transfemoral E-sheath was introduced into the right common femoral artery after progressively dilating over an Amplatz superstiff wire. An AL-1 catheter was used to direct a straight-tip exchange length wire across the native aortic valve into the left ventricle. This was exchanged out for a pigtail catheter and position was confirmed in the LV apex. Simultaneous LV and Ao pressures were recorded.  The pigtail catheter was exchanged for a Safari wire in the LV apex. Direct LV pacing threshold through the [facility] wire was tested and satisfactory.  BALLOON AORTIC VALVULOPLASTY:   Not performed.   TRANSCATHETER HEART VALVE DEPLOYMENT:   An Edwards Sapien 3 Ultra transcatheter heart valve (size 26 mm) was prepared and crimped per manufacturer's guidelines, and the proper orientation of the valve is confirmed on the [employer] delivery system. The valve was advanced through the introducer sheath using normal technique until in an appropriate position in the abdominal aorta beyond the sheath tip. The balloon was then retracted and using the fine-tuning wheel was centered on the valve. The valve was then advanced across the aortic arch using appropriate flexion of the catheter. The valve was carefully positioned across the aortic valve annulus. The Commander catheter was retracted using normal technique. Once final position of the valve has been confirmed by angiographic assessment, the valve is deployed during rapid ventricular pacing to maintain systolic blood pressure < 50 mmHg and pulse pressure < 10 mmHg. The balloon inflation is held for >3 seconds after reaching full deployment volume. Once the balloon has fully deflated the balloon is retracted into the ascending aorta and valve function is  assessed using echocardiography. There is felt to be no  paravalvular leak and no central aortic insufficiency.  The patient's hemodynamic recovery following valve deployment is good.  The deployment balloon and guidewire are both removed.    PROCEDURE COMPLETION:   The sheath was removed and femoral artery closure performed.  Protamine was administered once femoral arterial repair was complete. The temporary pacemaker, pigtail catheter and femoral sheaths were removed with manual pressure used for venous hemostasis.  A Mynx femoral closure device was utilized following removal of the diagnostic sheath in the left femoral artery.  The patient tolerated the procedure well and is transported to the cath lab recovery area in stable condition. There were no immediate intraoperative complications. All sponge instrument and needle counts are verified correct at completion of the operation.   No blood products were administered during the operation.  The patient received a total of 50 mL of intravenous contrast during the procedure.   Alleen Borne, MD 08/25/2022 3:22 PM

## 2022-08-25 NOTE — Discharge Summary (Incomplete)
[facility]   MULTIDISCIPLINARY HEART VALVE TEAM  Discharge Summary    Patient ID: Bradley Soto MRN: [id number]; DOB: June 08, 1940  Admit date: 08/25/2022 Discharge date: 08/25/2022  Primary Care Provider: Alysia Penna, MD  Primary Cardiologist: Kristeen Miss, MD / Dr. Lynnette Caffey & Dr. Laneta Simmers (TAVR)  Discharge Diagnoses    Principal Problem:   S/P TAVR (transcatheter aortic valve replacement) Active Problems:   Atrial fibrillation (HCC)   HLD (hyperlipidemia)   OSA on CPAP   Type II diabetes mellitus with renal manifestations (HCC)   HTN (hypertension)   S/P CABG x 4   ESRD (end stage renal disease) (HCC)   Anemia   Presence of permanent cardiac pacemaker   Severe aortic stenosis   Allergies Allergies  Allergen Reactions   Shellfish Allergy Rash    Diagnostic Studies/Procedures    TAVR OPERATIVE NOTE     Date of Procedure:                08/25/2022   Preoperative Diagnosis:      Severe Aortic Stenosis    Postoperative Diagnosis:    Same    Procedure:        Transcatheter Aortic Valve Replacement - Transfemoral Approach             Edwards Sapien 3 Resilia THV (size 26 mm, model # 9755RLS, serial # [id number] )              Co-Surgeons:                         Alleen Borne, MD and Alverda Skeans, MD Anesthesiologist:                  Eilene Ghazi, MD   Echocardiographer:              Thurmon Fair, MD   Pre-operative Echo Findings: Severe aortic stenosis Moderate left ventricular systolic dysfunction   Post-operative Echo Findings: No paravalvular leak Unchanged left ventricular systolic function   Left Heart Catheterization Findings: Left ventricular end-diastolic pressure of  _____________    Echo 08/26/22: completed but pending formal read at the time of discharge   History of Present Illness     Bradley Soto is a 82 y.o. male with a history of CAD s/p previous PCI and ultimately CABG in 2020, ESRD on HD, PAF not on AC due  to severe anemia, SSS s/p PPM placement, DM2, dilated ascending aorta, and severe aortic stenosis who presented to [facility] on 08/25/22 for planned TAVR.   He underwent multivessel CABG in 2020 by Dr. Vickey Sages with a LIMA to the LAD, SVG to the ramus and OM 3, and SVG to the diagonal branch. The distal right coronary artery branches were too small to bypass. He was started on HD in 2021. He has developed progressive exertional fatigue and shortness of breath. He developed hypotension during hemodialysis and intolerance to the runs. Echo 06/22/22 showed EF 55-60%, and severe AS with a mean grad 27.0 mmHg, peak grad 47.3 mmHg, AVA 0.87cm2, DVI 0.25, SVI 30. Cardiac catheterization 07/31/22 showed severe native three-vessel coronary disease with 4/4 patent grafts. The mean gradient across aortic valve was measured at 46 mmHg. He was seen in the ER on 08/05/22 for a fall and suffered a comminuted fracture of the right scapula.  The patient was evaluated by the multidisciplinary valve team and felt to have severe, symptomatic aortic stenosis and to be a  suitable candidate for TAVR, which was set up for 08/25/22.   Hospital Course     Consultants: none   Severe AS: s/p successful TAVR with a 26 mm Edwards Sapien 3 Ultra Resilia  THV via the TF approach on 08/25/22. Post operative echo completed but pending formal read. Groin sites are stable. ECG with *** and no high grade heart block. Continued on home Asprin. Plan for discharge home today with close follow up in the outpatient setting.   HTN: BP well controlled. No changes made.   ESRD on HD: usually T, Th, Sat. Had HD on Monday 7/8 in anticipation of TAVR. Appreciate nephrology assistance.   DMT2: treated with SSI while admitted. Resume home meds at discharge.   CAD: cardiac catheterization 07/31/22 showed severe native three-vessel coronary disease with 4/4 patent grafts. Continue medical therapy.   SSS s/p PPM: has not seen EP in a long time. Device was  interrogated by [employer] reps this AM. I have gotten him in to see Dr. Nelly Laurence next month.   Pulmonary nodule: pre TAVR CT showed "part solid nodule of the left upper lobe is stable when compared with most recent prior exam, however small solid component is new when compared with 12/01/2018 prior. 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 Follow-up chest CT is recommended in 6 months." Will discuss in the outpatient setting.   _____________  Discharge Vitals Blood pressure (!) 126/52, pulse 60, temperature 98.3 F (36.8 C), resp. rate 15, height 5\' 11"  (1.803 m), weight 99.8 kg, SpO2 95 %.  Filed Weights   08/25/22 0553  Weight: 99.8 kg     GEN: Well nourished, well developed, in no acute distress HEENT: normal Neck: no JVD or masses Cardiac: ***RRR; no murmurs, rubs, or gallops,no edema  Respiratory:  clear to auscultation bilaterally, normal work of breathing GI: soft, nontender, nondistended, + BS MS: no deformity or atrophy Skin: warm and dry, no rash Neuro:  Alert and Oriented x 3, Strength and sensation are intact Psych: euthymic mood, full affect   Labs & Radiologic Studies    CBC No results for input(s): "WBC", "NEUTROABS", "HGB", "HCT", "MCV", "PLT" in the last 72 hours. Basic Metabolic Panel No results for input(s): "NA", "K", "CL", "CO2", "GLUCOSE", "BUN", "CREATININE", "CALCIUM", "MG", "PHOS" in the last 72 hours. Liver Function Tests No results for input(s): "AST", "ALT", "ALKPHOS", "BILITOT", "PROT", "ALBUMIN" in the last 72 hours. No results for input(s): "LIPASE", "AMYLASE" in the last 72 hours. Cardiac Enzymes No results for input(s): "CKTOTAL", "CKMB", "CKMBINDEX", "TROPONINI" in the last 72 hours. BNP Invalid input(s): "POCBNP" D-Dimer No results for input(s): "DDIMER" in the last 72 hours. Hemoglobin A1C No results for input(s): "HGBA1C" in the last 72 hours. Fasting Lipid Panel No results for input(s): "CHOL", "HDL", "LDLCALC", "TRIG",  "CHOLHDL", "LDLDIRECT" in the last 72 hours. Thyroid Function Tests No results for input(s): "TSH", "T4TOTAL", "T3FREE", "THYROIDAB" in the last 72 hours.  Invalid input(s): "FREET3" _____________  CT CORONARY MORPH W/CTA COR W/SCORE W/CA W/CM &/OR WO/CM  Addendum Date: 08/14/2022   ADDENDUM REPORT: 08/14/2022 13:11 FINDINGS: Extracardiac findings will be described separately under dictation for contemporaneously obtained CTA chest, abdomen and pelvis. IMPRESSION: Please see separate dictation for contemporaneously obtained CTA chest, abdomen and pelvis dated 08/14/2022 for full description of relevant extracardiac findings. Electronically Signed   By: Allegra Lai M.D.   On: 08/14/2022 13:11   Result Date: 08/14/2022 CLINICAL DATA:  Severe Aortic Stenosis. EXAM: Cardiac TAVR CT TECHNIQUE: A non-contrast, gated CT  scan was obtained with axial slices of 3 mm through the heart for aortic valve calcium scoring. A 120 kV retrospective, gated, contrast cardiac scan was obtained. Gantry rotation speed was 250 msecs and collimation was 0.6 mm. Nitroglycerin was not given. The 3D data set was reconstructed in 5% intervals of the 0-95% of the R-R cycle. Systolic and diastolic phases were analyzed on a dedicated workstation using MPR, MIP, and VRT modes. The patient received 100 cc of contrast. FINDINGS: Image quality: Excellent. Noise artifact is: Limited. Valve Morphology: Tricuspid aortic valve with diffuse severe calcifications. Bulky calcifications of the NCC/LCC. Aortic Valve Calcium score: 2947 Aortic annular dimension: Phase assessed: 35% Annular area: 491 mm2 Annular perimeter: 80.7 mm Max diameter: 28.6 mm Min diameter: 22.5 mm Annular and subannular calcification: No annular calcium. Mild subannular calcium under the LCC. Membranous septum length: 9.6 mm Optimal coplanar projection: LAO 10 CAU 12 Coronary Artery Height above Annulus: Left Main: 9.8 mm Right Coronary: 17.1 mm Sinus of Valsalva  Measurements: Non-coronary: 34 mm Right-coronary: 34 mm Left-coronary: 35 mm Sinus of Valsalva Height: Non-coronary: 25.0 mm Right-coronary: 21.7 mm Left-coronary: 20.8 mm Sinotubular Junction: 30 mm Ascending Thoracic Aorta: 34 mm Coronary Arteries: Normal coronary origin. Left dominance. The study was performed without use of NTG and is insufficient for plaque evaluation. Please refer to recent cardiac catheterization for coronary assessment. S/p CABG. Cardiac Morphology: Right Atrium: Right atrial size is within normal limits. CIED leads noted in the RA/RV. Right Ventricle: The right ventricular cavity is dilated. Left Atrium: Left atrial size is normal in size. The LAA has been closed with a surgical clip. Left Ventricle: The ventricular cavity size is within normal limits. Evidence of basal to mid inferolateral infarction. Pulmonary arteries: Dilated pulmonary artery suggestive of pulmonary hypertension. Pulmonary veins: Normal pulmonary venous drainage. Pericardium: Normal thickness with no significant effusion or calcium present. Mitral Valve: The mitral valve is normal structure without significant calcification. Extra-cardiac findings: See attached radiology report for non-cardiac structures. IMPRESSION: 1. Annular measurements support a 26 mm S3 or 29 mm Evolut Pro. 2. No significant annular calcifications. Mild subannular calcium under the LCC. 3. Shallow coronary height for the left main, but large sinuses noted. 4. Optimal Fluoroscopic Angle for Delivery: LAO 10 CAU 12 5. S/p CABG. Evidence of basal to mid inferolateral infarction. 6. Dilated pulmonary artery suggestive of pulmonary hypertension. Gerri [facility]ore T. Flora Lipps, MD Electronically Signed: By: Lennie Odor M.D. On: 08/14/2022 12:56   CT ANGIO ABDOMEN PELVIS  W & WO CONTRAST  Result Date: 08/14/2022 CLINICAL DATA:  Preop evaluation for aortic valve replacement EXAM: CT ANGIOGRAPHY CHEST, ABDOMEN AND PELVIS TECHNIQUE: Non-contrast CT of the chest  was initially obtained. Multidetector CT imaging through the chest, abdomen and pelvis was performed using the standard protocol during bolus administration of intravenous contrast. Multiplanar reconstructed images and MIPs were obtained and reviewed to evaluate the vascular anatomy. RADIATION DOSE REDUCTION: This exam was performed according to the departmental dose-optimization program which includes automated exposure control, adjustment of the mA and/or kV according to patient size and/or use of iterative reconstruction technique. CONTRAST:  OMNIPAQUE IOHEXOL 350 MG/ML SOLN COMPARISON:  Chest CT dated June 05, 2022; August 05, 2022 shoulder radiograph; chest CT dated December 01, 2018 FINDINGS: CTA CHEST FINDINGS Cardiovascular: Left chest wall dual lead pacer with leads in the right atrium and left ventricle. Mild cardiomegaly. No pericardial effusion. Normal caliber thoracic aorta with moderate atherosclerotic disease. Severe coronary artery calcifications status post CABG. Mediastinum/Nodes: Esophagus unremarkable.  Heterogeneous thyroid, unchanged when compared with the prior exam. No enlarged lymph nodes seen in the chest. Lungs/Pleura: Central airways are patent. Part solid nodule of the left upper lobe measuring 15 mm with 5 mm solid component on series 6, image 31, stable when compared with most recent prior with new solid component when compared with prior exam. Unchanged left lower lobe and left upper lobe opacities which are likely due to scarring or atelectasis. No pleural effusion. Musculoskeletal: Comminuted fracture of the right scapula which is new when compared with June 05, 2022 prior. CTA ABDOMEN AND PELVIS FINDINGS Hepatobiliary: Suspicious liver lesion. Hydropic gallbladder with no evidence of gallbladder wall thickening. No biliary ductal dilation. Pancreas: Atrophic pancreas.  No main pancreatic duct dilation. Spleen: Normal in size without focal abnormality. Adrenals/Urinary Tract:  Bilateral adrenal glands are unremarkable. Atrophic kidneys. No hydronephrosis. Mild bladder wall thickening. Stomach/Bowel: Stomach is within normal limits. Diverticulosis. Prior partial right hemicolectomy. No evidence of bowel wall thickening, distention, or inflammatory changes. Vascular/lymphatic: Normal caliber abdominal aorta with severe atherosclerotic disease. Moderate to severe narrowing involving the origins of the aortic branch vessels. No enlarged lymph nodes seen in the abdomen or pelvis. Reproductive: Prostatomegaly. Other: Small bilateral fat containing inguinal hernias. Prior ventral abdominal hernia repair. No abdominopelvic ascites. Musculoskeletal: No acute or significant osseous findings. VASCULAR MEASUREMENTS PERTINENT TO TAVR: AORTA: Minimal Aortic Diameter -  13 mm Severity of Aortic Calcification-severe RIGHT PELVIS: Right Common Iliac Artery - Minimal Diameter-7.3 mm Tortuosity-moderate Calcification-moderate Right External Iliac Artery - Minimal Diameter-7.6 mm Tortuosity-moderate Calcification-mild Right Common Femoral Artery - Minimal Diameter-6.0 mm Tortuosity-none Calcification-severe LEFT PELVIS: Left Common Iliac Artery - Minimal Diameter-8.5 mm Tortuosity-mild Calcification-moderate Left External Iliac Artery - Minimal Diameter-7.9 mm Tortuosity-moderate Calcification-mild Left Common Femoral Artery - Minimal Diameter-4.6 mm Tortuosity-none Calcification-severe Review of the MIP images confirms the above findings. IMPRESSION: Vascular: 1. Vascular findings and measurements pertinent to potential TAVR procedure, as detailed above. 2. Thickening and calcification of the aortic valve, compatible with reported clinical history of aortic stenosis. 3. Moderate to severe aortoiliac atherosclerosis. Severe coronary artery calcifications status post CABG. Nonvascular: 1. Part solid nodule of the left upper lobe is stable when compared with most recent prior exam, however small solid  component is new when compared with December 01, 2018 prior. 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 Follow-up chest CT is recommended in 6 months. 2. Comminuted fracture of the right scapula, previously described on August 05, 2022 shoulder radiograph. Electronically Signed   By: Allegra Lai M.D.   On: 08/14/2022 11:57   CT ANGIO CHEST AORTA W/CM & OR WO/CM  Result Date: 08/14/2022 CLINICAL DATA:  Preop evaluation for aortic valve replacement EXAM: CT ANGIOGRAPHY CHEST, ABDOMEN AND PELVIS TECHNIQUE: Non-contrast CT of the chest was initially obtained. Multidetector CT imaging through the chest, abdomen and pelvis was performed using the standard protocol during bolus administration of intravenous contrast. Multiplanar reconstructed images and MIPs were obtained and reviewed to evaluate the vascular anatomy. RADIATION DOSE REDUCTION: This exam was performed according to the departmental dose-optimization program which includes automated exposure control, adjustment of the mA and/or kV according to patient size and/or use of iterative reconstruction technique. CONTRAST:  OMNIPAQUE IOHEXOL 350 MG/ML SOLN COMPARISON:  Chest CT dated June 05, 2022; August 05, 2022 shoulder radiograph; chest CT dated December 01, 2018 FINDINGS: CTA CHEST FINDINGS Cardiovascular: Left chest wall dual lead pacer with leads in the right atrium and left ventricle. Mild cardiomegaly. No pericardial effusion. Normal caliber thoracic aorta  with moderate atherosclerotic disease. Severe coronary artery calcifications status post CABG. Mediastinum/Nodes: Esophagus unremarkable. Heterogeneous thyroid, unchanged when compared with the prior exam. No enlarged lymph nodes seen in the chest. Lungs/Pleura: Central airways are patent. Part solid nodule of the left upper lobe measuring 15 mm with 5 mm solid component on series 6, image 31, stable when compared with most recent prior with new solid component when compared with prior  exam. Unchanged left lower lobe and left upper lobe opacities which are likely due to scarring or atelectasis. No pleural effusion. Musculoskeletal: Comminuted fracture of the right scapula which is new when compared with June 05, 2022 prior. CTA ABDOMEN AND PELVIS FINDINGS Hepatobiliary: Suspicious liver lesion. Hydropic gallbladder with no evidence of gallbladder wall thickening. No biliary ductal dilation. Pancreas: Atrophic pancreas.  No main pancreatic duct dilation. Spleen: Normal in size without focal abnormality. Adrenals/Urinary Tract: Bilateral adrenal glands are unremarkable. Atrophic kidneys. No hydronephrosis. Mild bladder wall thickening. Stomach/Bowel: Stomach is within normal limits. Diverticulosis. Prior partial right hemicolectomy. No evidence of bowel wall thickening, distention, or inflammatory changes. Vascular/lymphatic: Normal caliber abdominal aorta with severe atherosclerotic disease. Moderate to severe narrowing involving the origins of the aortic branch vessels. No enlarged lymph nodes seen in the abdomen or pelvis. Reproductive: Prostatomegaly. Other: Small bilateral fat containing inguinal hernias. Prior ventral abdominal hernia repair. No abdominopelvic ascites. Musculoskeletal: No acute or significant osseous findings. VASCULAR MEASUREMENTS PERTINENT TO TAVR: AORTA: Minimal Aortic Diameter -  13 mm Severity of Aortic Calcification-severe RIGHT PELVIS: Right Common Iliac Artery - Minimal Diameter-7.3 mm Tortuosity-moderate Calcification-moderate Right External Iliac Artery - Minimal Diameter-7.6 mm Tortuosity-moderate Calcification-mild Right Common Femoral Artery - Minimal Diameter-6.0 mm Tortuosity-none Calcification-severe LEFT PELVIS: Left Common Iliac Artery - Minimal Diameter-8.5 mm Tortuosity-mild Calcification-moderate Left External Iliac Artery - Minimal Diameter-7.9 mm Tortuosity-moderate Calcification-mild Left Common Femoral Artery - Minimal Diameter-4.6 mm Tortuosity-none  Calcification-severe Review of the MIP images confirms the above findings. IMPRESSION: Vascular: 1. Vascular findings and measurements pertinent to potential TAVR procedure, as detailed above. 2. Thickening and calcification of the aortic valve, compatible with reported clinical history of aortic stenosis. 3. Moderate to severe aortoiliac atherosclerosis. Severe coronary artery calcifications status post CABG. Nonvascular: 1. Part solid nodule of the left upper lobe is stable when compared with most recent prior exam, however small solid component is new when compared with December 01, 2018 prior. 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 Follow-up chest CT is recommended in 6 months. 2. Comminuted fracture of the right scapula, previously described on August 05, 2022 shoulder radiograph. Electronically Signed   By: Allegra Lai M.D.   On: 08/14/2022 11:57   CT Head Wo Contrast  Result Date: 08/05/2022 CLINICAL DATA:  Head trauma, minor (Age >= 65y) EXAM: CT HEAD WITHOUT CONTRAST TECHNIQUE: Contiguous axial images were obtained from the base of the skull through the vertex without intravenous contrast. RADIATION DOSE REDUCTION: This exam was performed according to the departmental dose-optimization program which includes automated exposure control, adjustment of the mA and/or kV according to patient size and/or use of iterative reconstruction technique. COMPARISON:  None Available. FINDINGS: Brain: No evidence of acute infarction, hemorrhage, hydrocephalus, extra-axial collection or mass lesion/mass effect. Sequela of moderate chronic microvascular ischemic change. Generalized volume loss. Mineralization of the basal ganglia bilaterally. Vascular: No hyperdense vessel or unexpected calcification. Skull: Mild soft tissue stranding along the midline posterior scalp. Small soft tissue lipoma in the suboccipital region measuring 1.8 x 0.4 cm. Sinuses/Orbits: Trace right mastoid effusion. No middle ear  effusion. Paranasal sinuses are notable for mild mucosal thickening in the left sphenoid sinus. Right lens replacement. Orbits are otherwise unremarkable. Other: None. IMPRESSION: 1. No acute intracranial abnormality. 2. Mild soft tissue stranding along the midline posterior scalp, which may represent a small scalp contusion. No underlying calvarial fracture. Electronically Signed   By: Lorenza Cambridge M.D.   On: 08/05/2022 11:33   DG Shoulder Right  Result Date: 08/05/2022 CLINICAL DATA:  Status post fall, severe shoulder pain EXAM: RIGHT SHOULDER - 2+ VIEW COMPARISON:  None Available. FINDINGS: No glenohumeral fracture or dislocation. Acute nondisplaced fracture of the body of the scapula. There is no evidence of arthropathy or other focal bone abnormality. Soft tissues are unremarkable. IMPRESSION: Acute nondisplaced fracture of the body of the scapula. Electronically Signed   By: Elige Ko M.D.   On: 08/05/2022 10:00   CARDIAC CATHETERIZATION  Result Date: 07/31/2022   Prox LAD to Mid LAD lesion is 85% stenosed between SP1 & 2. After Sp2, Mid LAD lesion is 100% stenosed with 100% stenosed side branch in 2nd Diag.   Ostial-Proximal Ramus Stent is 100% stenosed.   Prox Cx to Mid Cx lesion is 95% stenosed (involving previously placed DES STENT) with 85% stenosed side branch in LPAV.   Previously placed 1st Mrg Ostial-proximal DES STENT is 99% stenosed.   Dist Cx l(after 1st Mrg) preciously placed DES STENT is 100% stenosed with 100% stenosed side branch in 2nd Mrg.   Ost RCA to Mid RCA lesion is 100% stenosed.   --------------4/4 PATENT GRAFTS-----------------------------   SVG graft was visualized by angiography and is normal in caliber.  The graft exhibits no disease. The flow is not reversed.   LIMA graft was visualized by angiography and is normal in caliber. The graft exhibits no disease. The flow is not reversed.   Seq SVG- Ramus-3rdMrg graft was visualized by angiography and is normal in caliber.  The graft exhibits no disease. The flow is not reversed.   -----------------HEMODYMAMICS-------------------------   Hemodynamic findings consistent with mild pulmonary hypertension. LV end diastolic pressure is normal.   There is severe aortic valve stenosis - > mean gradient estimated 46 mmHg.  Prominent V wave on PCWP waveform. POST-CATH DIAGNOSES Severe native vessel CAD: 85% proximal to mid LAD after her SP 1 and D1, then 100 and occluded after SP2 99% proximal to mid LCx prior to stent without occlusion of the distal LCx after OM1 and 99% OM1 100% CTO of RI stent 100% proximal RCA occlusion with extensive small caliber left-to-right collaterals filling the distal branch. 4 of 4 grafts patent: Free LIMA-LAD (from SVG-D2 hood), SVG-D2, SeqSVG-RI-OM 2 Difficult to fully assess valve gradient as the patient went into a paced rhythm with the pullback across the aortic valve estimated MEAN GRADIENT 46 MMHG-SEVERE AORTIC STENOSIS Right Heart Pressures indicate Mild to Moderate Pulmonary Hypertension: PAP-mean 4615-27 mmHg; PCWP mean of 15 with a V wave up to 28 mmHg. LVEDP 14 mmHg.; Cardiac Output-Index: (Fick) 7.0-3.19; (Thermodilution) 5.78-469 Successful Mynx closure of the RFA RECOMMENDATIONS Discharge home from short stay with plans to continue follow-up with heart valve clinic to complete TAVR workup. Small caliber RCA is 100% occluded-not a PCI target Bryan Lemma, MD  Disposition   Pt is being discharged home today in good condition.  Follow-up Plans & Appointments     Follow-up Information     Janetta Hora, PA-C. Go on 09/02/2022.   Specialties: Cardiology, Radiology Why: @ 1:30PM, please arrive at least 10 minutes  early. Contact information: [address] [place] [address] [phone]                  Discharge Medications   Allergies as of 08/25/2022       Reactions   Shellfish Allergy Rash     Med Rec must be completed prior to using this  SMARTLINK***           Outstanding Labs/Studies   ***  Duration of Discharge Encounter   Greater than 30 minutes including physician time.  Byrd Hesselbach, PA-C 08/25/2022, 9:26 AM [phone]

## 2022-08-25 NOTE — Op Note (Signed)
[facility]  TAVR OPERATIVE NOTE   Date of Procedure:  08/25/2022  Preoperative Diagnosis: Severe Aortic Stenosis   Postoperative Diagnosis: Same   Procedure:   Transcatheter Aortic Valve Replacement - Transfemoral Approach  Edwards Sapien 3 Resilia THV (size 26 mm, model # 9755RLS, serial # [id number] )   Co-Surgeons:   Alleen Borne, MD and Alverda Skeans, MD Anesthesiologist:  Eilene Ghazi, MD  Echocardiographer:  Thurmon Fair, MD  Pre-operative Echo Findings: Severe aortic stenosis Moderate left ventricular systolic dysfunction  Post-operative Echo Findings: No paravalvular leak Unchanged left ventricular systolic function  Left Heart Catheterization Findings: Left ventricular end-diastolic pressure of   BRIEF CLINICAL NOTE AND INDICATIONS FOR SURGERY  The patient is an 82 year old male with a history of coronary artery disease status post CABG consisting of a LIMA to LAD, vein graft to ramus and obtuse marginal 2, and vein graft to diagonal, end-stage renal disease, paroxysmal atrial fibrillation on anticoagulation, sick sinus syndrome status post pacemaker and type 2 diabetes who was referred for elective transcatheter aortic valve replacement with a 26 mm SAPIEN 3 valve from the right transfemoral approach.  During the course of the patient's preoperative work up they have been evaluated comprehensively by a multidisciplinary team of specialists coordinated through the Multidisciplinary Heart Valve Clinic in the [facility].  They have been demonstrated to suffer from symptomatic severe aortic stenosis as noted above. The patient has been counseled extensively as to the relative risks and benefits of all options for the treatment of severe aortic stenosis including long term medical therapy, conventional surgery for aortic valve replacement, and transcatheter aortic valve replacement.  The patient has been independently evaluated  by Dr. Laneta Simmers with CT surgery and they are felt to be at high risk for conventional surgical aortic valve replacement. The surgeon indicated the patient would be a poor candidate for conventional surgery. Based upon review of all of the patient's preoperative diagnostic tests they are felt to be candidate for transcatheter aortic valve replacement using the transfemoral approach as an alternative to high risk conventional surgery.    Following the decision to proceed with transcatheter aortic valve replacement, a discussion has been held regarding what types of management strategies would be attempted intraoperatively in the event of life-threatening complications, including whether or not the patient would be considered a candidate for the use of cardiopulmonary bypass and/or conversion to open sternotomy for attempted surgical intervention.  The patient has been advised of a variety of complications that might develop peculiar to this approach including but not limited to risks of death, stroke, paravalvular leak, aortic dissection or other major vascular complications, aortic annulus rupture, device embolization, cardiac rupture or perforation, acute myocardial infarction, arrhythmia, heart block or bradycardia requiring permanent pacemaker placement, congestive heart failure, respiratory failure, renal failure, pneumonia, infection, other late complications related to structural valve deterioration or migration, or other complications that might ultimately cause a temporary or permanent loss of functional independence or other long term morbidity.  The patient provides full informed consent for the procedure as described and all questions were answered preoperatively.    DETAILS OF THE OPERATIVE PROCEDURE  PREPARATION:   The patient is brought to the operating room on the above mentioned date and central monitoring was established by the anesthesia team including placement of a radial arterial line. The  patient is placed in the supine position on the operating table.  Intravenous antibiotics are administered. Conscious sedation is used.  Baseline transthoracic echocardiogram was performed. The patient's chest, abdomen, both groins, and both lower extremities are prepared and draped in a sterile manner. A time out procedure is performed.   PERIPHERAL ACCESS:   Using the modified Seldinger technique, femoral arterial and venous access were obtained with placement of a 6 Fr sheath in the left femoral artery  u/s guidance.  A pigtail diagnostic catheter was passed through the femoral arterial sheath under fluoroscopic guidance into the aortic root.   Aortic root angiography was performed in order to determine the optimal angiographic angle for valve deployment.  TRANSFEMORAL ACCESS:  A micropuncture kit was used to gain access to the right femoral artery using u/s guidance. Position confirmed with angiography. Pre-closure with double ProGlide closure devices. The patient was heparinized systemically and ACT verified > 250 seconds.    A 14 Fr transfemoral E-sheath was introduced into the right femoral artery after progressively dilating over an Amplatz superstiff wire. An AL-1 catheter was used to direct a straight-tip exchange length wire across the native aortic valve into the left ventricle. This was exchanged out for a pigtail catheter and position was confirmed in the LV apex. Simultaneous left ventricular, aortic, and left ventricular end-diastolic pressures were recorded.  The pigtail catheter was then exchanged for an Safari wire in the LV apex.  Direct LV pacing thresholds were assessed and found to be adequate.   TRANSCATHETER HEART VALVE DEPLOYMENT:  An Edwards Sapien 3 THV (size 26 mm) was prepared and crimped per manufacturer's guidelines, and the proper orientation of the valve is confirmed on the [employer] delivery system. The valve was advanced through the introducer sheath using  normal technique until in an appropriate position in the abdominal aorta beyond the sheath tip. The balloon was then retracted and using the fine-tuning wheel was centered on the valve. The valve was then advanced across the aortic arch using appropriate flexion of the catheter. The valve was carefully positioned across the aortic valve annulus. The Commander catheter was retracted using normal technique. Once final position of the valve has been confirmed by angiographic assessment, the valve is deployed while temporarily holding ventilation and during rapid ventricular pacing to maintain systolic blood pressure < 50 mmHg and pulse pressure < 10 mmHg. The balloon inflation is held for >3 seconds after reaching full deployment volume. Once the balloon has fully deflated the balloon is retracted into the ascending aorta and valve function is assessed using TTE. There is felt to be no paravalvular leak and no central aortic insufficiency.  The patient's hemodynamic recovery following valve deployment is good.  The deployment balloon and guidewire are both removed. Echo demostrated acceptable post-procedural gradients, stable mitral valve function, and no AI.   PROCEDURE COMPLETION:  The sheath was then removed and closure devices were completed. Protamine was administered once femoral arterial repair was complete. The , pigtail catheters and femoral sheaths were removed with a Mynx closure device placed in the artery .    The patient tolerated the procedure well and is transported to the surgical intensive care in stable condition. There were no immediate intraoperative complications. All sponge instrument and needle counts are verified correct at completion of the operation.   No blood products were administered during the operation.  The patient received a total of 50 mL of intravenous contrast during the procedure.  Orbie Pyo MD 08/25/2022 9:57 AM

## 2022-08-25 NOTE — Anesthesia Postprocedure Evaluation (Signed)
Anesthesia Post Note  Patient: Bradley Soto  Procedure(s) Performed: Transcatheter Aortic Valve Replacement, Transfemoral INTRAOPERATIVE TRANSTHORACIC ECHOCARDIOGRAM     Patient location during evaluation: PACU Anesthesia Type: MAC Level of consciousness: awake and alert Pain management: pain level controlled Vital Signs Assessment: post-procedure vital signs reviewed and stable Respiratory status: spontaneous breathing, nonlabored ventilation, respiratory function stable and patient connected to nasal cannula oxygen Cardiovascular status: stable and blood pressure returned to baseline Postop Assessment: no apparent nausea or vomiting Anesthetic complications: no  There were no known notable events for this encounter.  Last Vitals:  Vitals:   08/25/22 1130 08/25/22 1153  BP: (!) 116/58 (!) 107/54  Pulse: 60 63  Resp: 11 20  Temp:  (!) 36.3 C  SpO2: 100% 97%    Last Pain:  Vitals:   08/25/22 1153  TempSrc: Oral  PainSc: 0-No pain                 Ryu Cerreta S

## 2022-08-26 ENCOUNTER — Encounter (HOSPITAL_COMMUNITY): Payer: Self-pay | Admitting: Internal Medicine

## 2022-08-26 ENCOUNTER — Inpatient Hospital Stay (HOSPITAL_COMMUNITY): Payer: Medicare Other

## 2022-08-26 DIAGNOSIS — Z952 Presence of prosthetic heart valve: Secondary | ICD-10-CM

## 2022-08-26 LAB — BASIC METABOLIC PANEL
Anion gap: 9 (ref 5–15)
BUN: 45 mg/dL — ABNORMAL HIGH (ref 8–23)
CO2: 28 mmol/L (ref 22–32)
Calcium: 8.6 mg/dL — ABNORMAL LOW (ref 8.9–10.3)
Chloride: 101 mmol/L (ref 98–111)
Creatinine, Ser: 6.49 mg/dL — ABNORMAL HIGH (ref 0.61–1.24)
GFR, Estimated: 8 mL/min — ABNORMAL LOW (ref >60)
Glucose, Bld: 95 mg/dL (ref 70–99)
Potassium: 5.1 mmol/L (ref 3.5–5.1)
Sodium: 138 mmol/L (ref 135–145)

## 2022-08-26 LAB — ECHOCARDIOGRAM COMPLETE
AR max vel: 2.22 cm2
AV Area VTI: 2.47 cm2
AV Area mean vel: 2.32 cm2
AV Mean grad: 5 mmHg
AV Peak grad: 10 mmHg
Ao pk vel: 1.58 m/s
Area-P 1/2: 3.28 cm2
Height: 71 in
MV VTI: 1.85 cm2
S' Lateral: 3.6 cm
Weight: 3537.6 oz

## 2022-08-26 LAB — CBC
HCT: 25.5 % — ABNORMAL LOW (ref 39.0–52.0)
Hemoglobin: 8.1 g/dL — ABNORMAL LOW (ref 13.0–17.0)
MCH: 33.1 pg (ref 26.0–34.0)
MCHC: 31.8 g/dL (ref 30.0–36.0)
MCV: 104.1 fL — ABNORMAL HIGH (ref 80.0–100.0)
Platelets: 114 10*3/uL — ABNORMAL LOW (ref 150–400)
RBC: 2.45 MIL/uL — ABNORMAL LOW (ref 4.22–5.81)
RDW: 17.2 % — ABNORMAL HIGH (ref 11.5–15.5)
WBC: 11.6 10*3/uL — ABNORMAL HIGH (ref 4.0–10.5)
nRBC: 0 % (ref 0.0–0.2)

## 2022-08-26 LAB — MAGNESIUM: Magnesium: 2.2 mg/dL (ref 1.7–2.4)

## 2022-08-26 LAB — GLUCOSE, CAPILLARY: Glucose-Capillary: 107 mg/dL — ABNORMAL HIGH (ref 70–99)

## 2022-08-26 MED FILL — Midazolam HCl Inj 2 MG/2ML (Base Equivalent): INTRAMUSCULAR | Qty: 0.5 | Status: AC

## 2022-08-26 MED FILL — Fentanyl Citrate Preservative Free (PF) Inj 100 MCG/2ML: INTRAMUSCULAR | Qty: 1 | Status: AC

## 2022-08-26 NOTE — Progress Notes (Signed)
Heart Failure Navigator Progress Note  Assessed for Heart & Vascular TOC clinic readiness.  Patient does not meet criteria due to ESRD on hemodialysis.   Navigator will sign off at this time.    Keshonda Monsour, BSN, RN Heart Failure Nurse Navigator Secure Chat Only   

## 2022-08-26 NOTE — Plan of Care (Signed)

## 2022-08-26 NOTE — TOC CM/SW Note (Signed)
Transition of Care [facility]) - Inpatient Brief Assessment   Patient Details  Name: Bradley Soto MRN: [id number] Date of Birth: August 07, 1940  Transition of Care [facility] CM/SW Contact:    Darrold Span, RN Phone Number: 08/26/2022, 10:09 AM   Clinical Narrative: Pt stable for transition home today s/p TAVR.    Transition of Care Asessment: Insurance and Status: Insurance coverage has been reviewed Patient has primary care physician: Yes Home environment has been reviewed: home Prior level of function:: self/independent Prior/Current Home Services: No current home services Social Determinants of Health Reivew: SDOH reviewed no interventions necessary Readmission risk has been reviewed: Yes Transition of care needs: no transition of care needs at this time

## 2022-08-26 NOTE — Progress Notes (Signed)
Pt refused mobility despite encouragement and explanation of importance. Pt states he'll walk at home. Pt did walk with MS yesterday and did fine. I explained CRP2 program, restrictions, and ex guidelines with pt. Pt has moderate interest in program, he has HD on T/Th/S.   Referral placed to: [facility] under cardiologist Dr. Leodis Sias Qualifying Dx: 7/9 TAVR  Alva Kuenzel E Medford Staheli 08/26/2022 9:22 AM   [phone]

## 2022-08-27 ENCOUNTER — Telehealth: Payer: Self-pay | Admitting: Physician Assistant

## 2022-08-27 DIAGNOSIS — Z992 Dependence on renal dialysis: Secondary | ICD-10-CM | POA: Diagnosis not present

## 2022-08-27 DIAGNOSIS — E1122 Type 2 diabetes mellitus with diabetic chronic kidney disease: Secondary | ICD-10-CM | POA: Diagnosis not present

## 2022-08-27 DIAGNOSIS — N186 End stage renal disease: Secondary | ICD-10-CM | POA: Diagnosis not present

## 2022-08-27 DIAGNOSIS — N2581 Secondary hyperparathyroidism of renal origin: Secondary | ICD-10-CM | POA: Diagnosis not present

## 2022-08-27 DIAGNOSIS — D631 Anemia in chronic kidney disease: Secondary | ICD-10-CM | POA: Diagnosis not present

## 2022-08-27 NOTE — Telephone Encounter (Signed)
  [facility]ULAR [facility]ENTER   MULTIDISCIPLINARY HEART VALVE TEAM   Patient contacted regarding discharge from [facility] on 7/10  Patient understands to follow up with a structural heart APP on 7/17 at [address].  Patient understands discharge instructions? yes Patient understands medications and regimen? yes Patient understands to bring all medications to this visit? yes  Cline Crock PA-C  [facility]

## 2022-08-28 ENCOUNTER — Telehealth (HOSPITAL_COMMUNITY): Payer: Self-pay

## 2022-08-28 ENCOUNTER — Ambulatory Visit (INDEPENDENT_AMBULATORY_CARE_PROVIDER_SITE_OTHER): Payer: Medicare Other

## 2022-08-28 DIAGNOSIS — I495 Sick sinus syndrome: Secondary | ICD-10-CM

## 2022-08-28 LAB — CUP PACEART REMOTE DEVICE CHECK
Battery Remaining Longevity: 120 mo
Battery Voltage: 3.01 V
Brady Statistic AP VP Percent: 15.61 %
Brady Statistic AP VS Percent: 0.45 %
Brady Statistic AS VP Percent: 82.06 %
Brady Statistic AS VS Percent: 1.88 %
Brady Statistic RA Percent Paced: 16.08 %
Brady Statistic RV Percent Paced: 97.67 %
Date Time Interrogation Session: 20240711194104
Implantable Lead Connection Status: 753985
Implantable Lead Connection Status: 753985
Implantable Lead Implant Date: 20020325
Implantable Lead Implant Date: 20020325
Implantable Lead Location: 753859
Implantable Lead Location: 753860
Implantable Lead Model: 4092
Implantable Lead Model: 4524
Implantable Pulse Generator Implant Date: 20210415
Lead Channel Impedance Value: 247 Ohm
Lead Channel Impedance Value: 304 Ohm
Lead Channel Impedance Value: 437 Ohm
Lead Channel Impedance Value: 627 Ohm
Lead Channel Pacing Threshold Amplitude: 0.625 V
Lead Channel Pacing Threshold Amplitude: 2.5 V
Lead Channel Pacing Threshold Pulse Width: 0.4 ms
Lead Channel Pacing Threshold Pulse Width: 0.4 ms
Lead Channel Sensing Intrinsic Amplitude: 0.5 mV
Lead Channel Sensing Intrinsic Amplitude: 0.5 mV
Lead Channel Sensing Intrinsic Amplitude: 26.5 mV
Lead Channel Sensing Intrinsic Amplitude: 28.625 mV
Lead Channel Setting Pacing Amplitude: 2.5 V
Lead Channel Setting Pacing Amplitude: 3 V
Lead Channel Setting Pacing Pulse Width: 0.4 ms
Lead Channel Setting Sensing Sensitivity: 1.2 mV
Zone Setting Status: 755011
Zone Setting Status: 755011

## 2022-08-28 NOTE — Telephone Encounter (Signed)
Pt's wife stated that pt is not interested in cardiac rehab. Closed referral.

## 2022-08-29 DIAGNOSIS — E1122 Type 2 diabetes mellitus with diabetic chronic kidney disease: Secondary | ICD-10-CM | POA: Diagnosis not present

## 2022-08-29 DIAGNOSIS — Z992 Dependence on renal dialysis: Secondary | ICD-10-CM | POA: Diagnosis not present

## 2022-08-29 DIAGNOSIS — D631 Anemia in chronic kidney disease: Secondary | ICD-10-CM | POA: Diagnosis not present

## 2022-08-29 DIAGNOSIS — N186 End stage renal disease: Secondary | ICD-10-CM | POA: Diagnosis not present

## 2022-08-29 DIAGNOSIS — N2581 Secondary hyperparathyroidism of renal origin: Secondary | ICD-10-CM | POA: Diagnosis not present

## 2022-09-01 DIAGNOSIS — N186 End stage renal disease: Secondary | ICD-10-CM | POA: Diagnosis not present

## 2022-09-01 DIAGNOSIS — E1122 Type 2 diabetes mellitus with diabetic chronic kidney disease: Secondary | ICD-10-CM | POA: Diagnosis not present

## 2022-09-01 DIAGNOSIS — N2581 Secondary hyperparathyroidism of renal origin: Secondary | ICD-10-CM | POA: Diagnosis not present

## 2022-09-01 DIAGNOSIS — D631 Anemia in chronic kidney disease: Secondary | ICD-10-CM | POA: Diagnosis not present

## 2022-09-01 DIAGNOSIS — Z992 Dependence on renal dialysis: Secondary | ICD-10-CM | POA: Diagnosis not present

## 2022-09-01 NOTE — Progress Notes (Unsigned)
[facility]   MULTIDISCIPLINARY HEART VALVE CLINIC                                     Cardiology Office Note:    Date:  09/02/2022   ID:  Daryel Gerald, DOB 1940-09-08, MRN [id number]  PCP:  Alysia Penna, MD  [facility] Cardiologist:  Kristeen Miss, MD / Dr. Lynnette Caffey & Dr. Laneta Simmers (TAVR)  [facility] Electrophysiologist:  None   Referring MD: Alysia Penna, MD   [facility] s/p TAVR  History of Present Illness:    Jamerion Cabello is a 82 y.o. male with a hx of CAD s/p previous PCI and ultimately CABG in 2020, ESRD on HD, PAF not on AC due to severe anemia, SSS s/p PPM placement, DM2, dilated ascending aorta, and severe aortic stenosis s/p TAVR (08/25/22) who presents to clinic for follow up.   He underwent multivessel CABG in 2020 by Dr. Vickey Sages with a LIMA to the LAD, SVG to the ramus and OM 3, and SVG to the diagonal branch. The distal right coronary artery branches were too small to bypass. He was started on HD in 2021. He has developed progressive exertional fatigue and shortness of breath. He developed hypotension during hemodialysis and intolerance to the runs. Echo 06/22/22 showed EF 55-60%, and severe AS with a mean grad 27.0 mmHg, peak grad 47.3 mmHg, AVA 0.87cm2, DVI 0.25, SVI 30. Cardiac catheterization 07/31/22 showed severe native three-vessel coronary disease with 4/4 patent grafts. The mean gradient across aortic valve was measured at 46 mmHg. He was seen in the ER on 08/05/22 for a fall and suffered a comminuted fracture of the right scapula.   He was evaluated by the multidisciplinary valve team and underwent successful TAVR with a 26 mm Edwards Sapien 3 Ultra Resilia THV via the TF approach on 08/25/22. Post operative echo showed EF 50-55%, normally functioning TAVR with a mean gradient of 5 mmHg and no PVL. He was continued on home Asprin.   Today the patient presents to clinic for follow up. Here with his wife. No CP or SOB. No LE edema, orthopnea or PND. No  dizziness or syncope. No blood in stool or urine. No palpitations. He is tolerating HD a little better since TAVR. Still has a little hypotension during HD but less.   Past Medical History:  Diagnosis Date   Anxiety    Atrial fibrillation (HCC)    Bell's palsy    left side of face droops   CAD (coronary artery disease)    s/p stent placement   Colon polyps    ESRD (end stage renal disease) (HCC)    [place]- [facility]- [facility]   Gout    History of blood transfusion    History of kidney stones    Passes   HLD (hyperlipidemia)    HTN (hypertension)    Insomnia    MI (myocardial infarction) (HCC) 1998   Neuropathy    NSTEMI (non-ST elevated myocardial infarction) (HCC)    s/p PCI in early 2000s   OSA on CPAP    wears CPAP   Presence of permanent cardiac pacemaker    S/P TAVR (transcatheter aortic valve replacement) 08/25/2022   s/p TAVR with a 26 mm Edwards S3UR via the TF approach by Dr. Lynnette Caffey & Dr. Laneta Simmers   Severe aortic stenosis    Type II or unspecified type diabetes mellitus without mention of  complication, not stated as uncontrolled    Wears glasses    reading    Wears partial dentures     Past Surgical History:  Procedure Laterality Date   APPENDECTOMY     AV FISTULA PLACEMENT Right 07/28/2019   Procedure: RIGHT ARM Brachiocephalic ARTERIOVENOUS (AV) FISTULA CREATION;  Surgeon: Nada Libman, MD;  Location: [facility] OR;  Service: Vascular;  Laterality: Right;   CATARACT EXTRACTION     CLIPPING OF ATRIAL APPENDAGE  12/05/2018   Procedure: Clipping Of Atrial Appendage;  Surgeon: Linden Dolin, MD;  Location: [facility] OR;  Service: Open Heart Surgery;;   CORONARY ANGIOPLASTY WITH STENT PLACEMENT     x4   CORONARY ARTERY BYPASS GRAFT N/A 12/05/2018   Procedure: CORONARY ARTERY BYPASS GRAFTING (CABG) X FOUR USING LEFT INTERNAL MAMMARY ARTERY AND RIGHT SAPHENOUS VEIN GRAFTS;  Surgeon: Linden Dolin, MD;  Location: [facility] OR;  Service: Open Heart Surgery;  Laterality: N/A;    HERNIA REPAIR     ventral hernia repair with mesh--revision also was required   INSERT / REPLACE / REMOVE PACEMAKER  05/10/2000   pacemaker implanted in [place]]   INTRAOPERATIVE TRANSTHORACIC ECHOCARDIOGRAM N/A 08/25/2022   Procedure: INTRAOPERATIVE TRANSTHORACIC ECHOCARDIOGRAM;  Surgeon: Orbie Pyo, MD;  Location: [facility] INVASIVE CV LAB;  Service: Open Heart Surgery;  Laterality: N/A;   IR THORACENTESIS ASP PLEURAL SPACE W/IMG GUIDE  12/21/2018   IR THORACENTESIS ASP PLEURAL SPACE W/IMG GUIDE  12/30/2018   LEFT HEART CATH AND CORONARY ANGIOGRAPHY N/A 11/30/2018   Procedure: LEFT HEART CATH AND CORONARY ANGIOGRAPHY;  Surgeon: Kathleene Hazel, MD;  Location: [facility] INVASIVE CV LAB;  Service: Cardiovascular;  Laterality: N/A;   MAZE N/A 12/05/2018   Procedure: MAZE;  Surgeon: Linden Dolin, MD;  Location: [facility] OR;  Service: Open Heart Surgery;  Laterality: N/A;   PACEMAKER GENERATOR CHANGE  09/03/09   MDT Adapta generator change in [place]   PARTIAL COLON REMOVAL  2/2   polyp that could not be removed on colonoscopy   PPM GENERATOR CHANGEOUT N/A 06/01/2019   Procedure: PPM GENERATOR CHANGEOUT;  Surgeon: Hillis Range, MD;  Location: [facility] INVASIVE CV LAB;  Service: Cardiovascular;  Laterality: N/A;   REVISON OF ARTERIOVENOUS FISTULA Right 01/24/2020   Procedure: BANDING OF RIGHT ARM ARTERIOVENOUS FISTULA;  Surgeon: Nada Libman, MD;  Location: [facility] OR;  Service: Vascular;  Laterality: Right;   RIGHT/LEFT HEART CATH AND CORONARY/GRAFT ANGIOGRAPHY N/A 07/31/2022   Procedure: RIGHT/LEFT HEART CATH AND CORONARY/GRAFT ANGIOGRAPHY;  Surgeon: Marykay Lex, MD;  Location: [facility] INVASIVE CV LAB;  Service: Cardiovascular;  Laterality: N/A;   TEE WITHOUT CARDIOVERSION N/A 12/05/2018   Procedure: TRANSESOPHAGEAL ECHOCARDIOGRAM (TEE);  Surgeon: Linden Dolin, MD;  Location: [facility] OR;  Service: Open Heart Surgery;  Laterality: N/A;   TRANSCATHETER AORTIC VALVE REPLACEMENT, TRANSFEMORAL N/A 08/25/2022    Procedure: Transcatheter Aortic Valve Replacement, Transfemoral;  Surgeon: Orbie Pyo, MD;  Location: [facility] INVASIVE CV LAB;  Service: Open Heart Surgery;  Laterality: N/A;    Current Medications: Current Meds  Medication Sig   amoxicillin (AMOXIL) 500 MG tablet Take 4 tablets (2,000 mg total) by mouth as directed. 1 hour prior to dental work including cleanings   aspirin EC 81 MG tablet Take 81 mg by mouth in the morning.   B Complex-C-Zn-Folic Acid (DIALYVITE 800 WITH ZINC) 0.8 MG TABS Take 1 tablet by mouth at bedtime.   CALCITRIOL PO Take 1 Dose by mouth Every Tuesday,Thursday,and Saturday with dialysis.  copper tablet Take 4 mg by mouth daily.   CVS VITAMIN B12 1000 MCG tablet TAKE 1 TABLET BY MOUTH EVERY DAY   diclofenac Sodium (VOLTAREN) 1 % GEL Apply 1 Application topically 4 (four) times daily as needed (neck pain).   diltiazem (CARDIZEM SR) 60 MG 12 hr capsule Take 1 capsule (60 mg total) by mouth as needed (palpitations).   gabapentin (NEURONTIN) 100 MG capsule Take 1 capsule (100 mg total) by mouth 3 (three) times daily. (Patient taking differently: Take 200 mg by mouth at bedtime.)   ibuprofen (ADVIL) 200 MG tablet Take 400 mg by mouth every 6 (six) hours as needed for moderate pain.   iron sucrose in sodium chloride 0.9 % 100 mL once a week.   Methoxy PEG-Epoetin Beta (MIRCERA IJ) Mircera   Methoxy PEG-Epoetin Beta (MIRCERA IJ) Mircera   midodrine (PROAMATINE) 5 MG tablet Take 5 mg by mouth Every Tuesday,Thursday,and Saturday with dialysis.   nitroGLYCERIN (NITROSTAT) 0.4 MG SL tablet Place 1 tablet (0.4 mg total) under the tongue every 5 (five) minutes as needed for chest pain.   NOVOLOG MIX 70/30 FLEXPEN (70-30) 100 UNIT/ML FlexPen Inject 30 Units into the skin in the morning and at bedtime.   OVER THE COUNTER MEDICATION Take 1 tablet by mouth at bedtime as needed (sleep). Sleep-eze sleep aid   oxyCODONE-acetaminophen (PERCOCET/ROXICET) 5-325 MG tablet Take 1 tablet by  mouth every 6 (six) hours as needed.   sevelamer carbonate (RENVELA) 800 MG tablet Take 1 tablet (800 mg total) by mouth 3 (three) times daily with meals. (Patient taking differently: Take 2,400 mg by mouth 3 (three) times daily with meals.)   traMADol (ULTRAM) 50 MG tablet Take 50 mg by mouth daily as needed for moderate pain.     Allergies:   Shellfish allergy   Social History   Socioeconomic History   Marital status: Married    Spouse name: Not on file   Number of children: Not on file   Years of education: Not on file   Highest education level: Not on file  Occupational History   Not on file  Tobacco Use   Smoking status: Former    Types: Cigarettes   Smokeless tobacco: Never   Tobacco comments:    " many years ago"  Vaping Use   Vaping status: Never Used  Substance and Sexual Activity   Alcohol use: No   Drug use: No   Sexual activity: Not on file  Other Topics Concern   Not on file  Social History Narrative   Lives with wife   Caffeine coffee 2 daily   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   Social Determinants of Health   Financial Resource Strain: Not on file  Food Insecurity: Not on file  Transportation Needs: Not on file  Physical Activity: Not on file  Stress: Not on file  Social Connections: Not on file     Family History: The patient's family history includes Breast cancer in his sister; Deep vein thrombosis in his sister; Heart attack (age of onset: 62) in his father; Heart disease in his father; Pulmonary embolism in his sister.  ROS:   Please see the history of present illness.    All other systems reviewed and are negative.  EKGs/Labs/Other Studies Reviewed:    Cardiac Studies & Procedures   CARDIAC CATHETERIZATION  CARDIAC CATHETERIZATION 07/31/2022  Narrative   Prox LAD to Mid LAD lesion is 85% stenosed between SP1 & 2. After Sp2, Mid LAD  lesion is 100% stenosed with 100% stenosed side branch in 2nd Diag.   Ostial-Proximal Ramus Stent  is 100% stenosed.   Prox Cx to Mid Cx lesion is 95% stenosed (involving previously placed DES STENT) with 85% stenosed side branch in LPAV.   Previously placed 1st Mrg Ostial-proximal DES STENT is 99% stenosed.   Dist Cx l(after 1st Mrg) preciously placed DES STENT is 100% stenosed with 100% stenosed side branch in 2nd Mrg.   Ost RCA to Mid RCA lesion is 100% stenosed.   --------------4/4 PATENT GRAFTS-----------------------------   SVG graft was visualized by angiography and is normal in caliber.  The graft exhibits no disease. The flow is not reversed.   LIMA graft was visualized by angiography and is normal in caliber. The graft exhibits no disease. The flow is not reversed.   Seq SVG- Ramus-3rdMrg graft was visualized by angiography and is normal in caliber. The graft exhibits no disease. The flow is not reversed.   -----------------HEMODYMAMICS-------------------------   Hemodynamic findings consistent with mild pulmonary hypertension. LV end diastolic pressure is normal.   There is severe aortic valve stenosis - > mean gradient estimated 46 mmHg.  Prominent V wave on PCWP waveform.   POST-CATH DIAGNOSES Severe native vessel CAD: 85% proximal to mid LAD after her SP 1 and D1, then 100 and occluded after SP2 99% proximal to mid LCx prior to stent without occlusion of the distal LCx after OM1 and 99% OM1 100% CTO of RI stent 100% proximal RCA occlusion with extensive small caliber left-to-right collaterals filling the distal branch. 4 of 4 grafts patent: Free LIMA-LAD (from SVG-D2 hood), SVG-D2, SeqSVG-RI-OM 2 Difficult to fully assess valve gradient as the patient went into a paced rhythm with the pullback across the aortic valve estimated MEAN GRADIENT 46 MMHG-SEVERE AORTIC STENOSIS Right Heart Pressures indicate Mild to Moderate Pulmonary Hypertension: PAP-mean 4615-27 mmHg; PCWP mean of 15 with a V wave up to 28 mmHg. LVEDP 14 mmHg.; Cardiac Output-Index: (Fick) 7.0-3.19;  (Thermodilution) 1.61-096 Successful Mynx closure of the RFA   RECOMMENDATIONS Discharge home from short stay with plans to continue follow-up with heart valve clinic to complete TAVR workup. Small caliber RCA is 100% occluded-not a PCI target    Bryan Lemma, MD  Findings Coronary Findings Diagnostic  Dominance: Co-dominant  Left Main Vessel was injected. Vessel is normal in caliber.  Left Anterior Descending Vessel is moderate in size. Prox LAD to Mid LAD lesion is 85% stenosed. Mid LAD lesion is 100% stenosed with 100% stenosed side branch in 2nd Diag.  First Diagonal Branch Vessel is small in size.  Second Diagonal Branch Vessel is moderate in size.  Third Diagonal Branch Vessel is small in size.  Ramus Intermedius Ramus lesion is 100% stenosed. The lesion is chronically occluded. The lesion was previously treated using a drug eluting stent over 2 years ago. Previously placed stent displays restenosis.  Left Circumflex Vessel is normal in caliber. Prox Cx to Mid Cx lesion is 95% stenosed with 85% stenosed side branch in LPAV. The lesion was previously treated using a drug eluting stent over 2 years ago. Proximal edge of previous stent Previously placed stent displays restenosis. Dist Cx lesion is 100% stenosed with 100% stenosed side branch in 2nd Mrg. The lesion is chronically occluded. The lesion was previously treated using a drug eluting stent over 2 years ago. Previously placed stent displays restenosis.  First Obtuse Marginal Branch Vessel is small in size. 1st Mrg lesion is 99% stenosed. The lesion  was previously treated using a drug eluting stent over 2 years ago. Previously placed stent displays restenosis.  First Left Posterolateral Branch Vessel is small in size.  Second Left Posterolateral Branch Vessel is small in size.  Right Coronary Artery Vessel was injected. Vessel is small. There is severe diffuse disease throughout the vessel. Ost RCA to  Mid RCA lesion is 100% stenosed. The lesion is chronically occluded.  Right Posterior Descending Artery Vessel is small in size. There is severe disease in the vessel. Collaterals RPDA filled by collaterals from 1st Sept.  Collaterals RPDA filled by collaterals from 2nd Sept.  First Right Posterolateral Branch There is moderate disease in the vessel.  Saphenous Graft To 2nd Diag SVG graft was visualized by angiography and is normal in caliber.  The graft exhibits no disease. The flow is not reversed. SVG-D2  LIMA Y-graft Branch To Mid LAD LIMA graft was visualized by angiography and is normal in caliber.  The flow is not reversed. LIMA-LAD  Sequential Jump Graft Graft To Ramus, 2nd Mrg Seq SVG- Ramus-3rdMrg graft was visualized by angiography and is normal in caliber.  The graft exhibits no disease. The flow is not reversed.  Intervention  No interventions have been documented.   CARDIAC CATHETERIZATION  CARDIAC CATHETERIZATION 11/30/2018  Narrative  Ramus lesion is 99% stenosed.  Prox LAD to Mid LAD lesion is 70% stenosed.  Dist Cx lesion is 60% stenosed.  Mid RCA lesion is 95% stenosed.  3rd Mrg-1 lesion is 80% stenosed.  3rd Mrg-2 lesion is 99% stenosed.  1st LPL lesion is 99% stenosed.  1. Severe multi-vessel CAD 2. Moderately severe mid LAD stenosis 3. Severe restenosis of the stented segment in the proximal segment of the ramus intermediate branch 4. Severe restenosis mid Circumflex/obtuse marginal stented segment. Severe stenosis in the first obtuse marginal branch beyond the stented segment. Severe stenosis distal Circumflex leading into the moderate caliber second obtuse marginal branch. 5. Severe stenosis mid RCA  Recommendations: Pt is a diabetic with mult-vessel CAD. Will ask CT surgery to see him to discuss potential bypass surgery. If he is not felt to be a candidate for bypass, would consider multi-vessel PCI. Admit to telemetry. Will hydrate  tonight. BMET in am.  Findings Coronary Findings Diagnostic  Dominance: Co-dominant  Left Anterior Descending Prox LAD to Mid LAD lesion is 70% stenosed.  Ramus Intermedius Ramus lesion is 99% stenosed. The lesion was previously treated.  Left Circumflex Vessel is large. Dist Cx lesion is 60% stenosed. The lesion was previously treated.  Third Obtuse Marginal Branch 3rd Mrg-1 lesion is 80% stenosed. The lesion was previously treated. 3rd Mrg-2 lesion is 99% stenosed.  First Left Posterolateral Branch 1st LPL lesion is 99% stenosed.  Right Coronary Artery Vessel is moderate in size. Mid RCA lesion is 95% stenosed.  Intervention  No interventions have been documented.   STRESS TESTS  NM PET CT CARDIAC PERFUSION MULTI W/ABSOLUTE BLOODFLOW 11/18/2021  Narrative   Findings are consistent with prior myocardial infarction. There is no evidence of post infarct ischemia. The study is high risk.   Myocardial blood flow was computed to be 0.49ml/g/min at rest and 0.84ml/g/min at stress. Global myocardial blood flow reserve was 0.98 and was highly abnormal.  Though sensitivity of MBRF is less reliable in the setting of CABG, stress flows less that rest flows can be seen in patients non responsive to Lexiscan or can be seen in highly abnormal perfusion.   Thare are small basal inferior and basal  anterior perfusion defects in rest and stress with hypokinesis consistent with infarct.   Rest left ventricular function is abnormal. Rest global function is mildly reduced. Rest EF: 46 %. Stress left ventricular function is abnormal. Stress global function is mildly reduced. Stress EF: 40 %. End diastolic cavity size is normal. End systolic cavity size is normal. LVEF Reserve -6% is a negative prognostic factor.   Coronary calcium was present on the attenuation correction CT images. Severe coronary calcifications were present. Coronary calcifications were present in the left anterior descending  artery, left circumflex artery and right coronary artery distribution(s).   Electronically Signed By: Riley Lam MD FASE  CLINICAL DATA:  This over-read does not include interpretation of cardiac or coronary anatomy or pathology. The Cardiac PET CT interpretation by the cardiologist is attached.  COMPARISON:  Chest CT December 01, 2018 and chest radiograph October 24, 2021.  FINDINGS: Vascular: Aortic atherosclerosis. Prior median sternotomy and CABG. Partially visualized intracardiac leads.  Mediastinum/Nodes: Within the visualized portions of the chest there are no pathologically enlarged mediastinal or hilar lymph nodes within the limitation of noncontrast enhanced examination. The mid/distal esophagus is grossly unremarkable.  Lungs/Pleura: Scattered pulmonary nodules for instance in the right lower lobe measuring 8 mm on image 5/4 and in the right middle lobe measuring 6 mm on image 45/4. Chronic left pleural thickening with a rounded consolidative focus in the lingula adjacent to the fissure measuring 19 mm on image 39/4 corresponding with the region of atelectasis seen on prior examination likely reflecting round atelectasis. Dendritic type calcifications in the left lung base.  Upper Abdomen: No acute abnormality.  Musculoskeletal: Bilateral gynecomastia. Multilevel degenerative changes spine.  IMPRESSION: 1. Scattered pulmonary nodules measuring up to 8 mm in the right lower lobe. Suggest follow-up dedicated chest CT in 3-6 months. 2. Chronic left pleural thickening with a rounded consolidative focus in the lingula adjacent to the fissure measuring 19 mm corresponding with the region of atelectasis seen on prior examination likely reflecting round atelectasis. Suggest attention on follow-up chest CT 3.  Aortic Atherosclerosis (ICD10-I70.0).   Electronically Signed By: Maudry Mayhew M.D. On: 11/18/2021 17:45   ECHOCARDIOGRAM  ECHOCARDIOGRAM  COMPLETE 08/26/2022  Narrative ECHOCARDIOGRAM REPORT    Patient Name:   MACKAY HANAUER Date of Exam: 08/26/2022 Medical Rec #:  [id number]      Height:       71.0 in Accession #:    [id number]     Weight:       221.1 lb Date of Birth:  [birth year]       BSA:          2.200 m Patient Age:    81 years       BP:           122/57 mmHg Patient Gender: M              HR:           82 bpm. Exam Location:  Inpatient  Procedure: 2D Echo, Color Doppler and Cardiac Doppler  Indications:    S/P TAVR  History:        Patient has prior history of Echocardiogram examinations, most recent 08/25/2022. CAD, Prior CABG, Aortic Valve Disease; Risk Factors:Hypertension. Aortic Valve: 26 mm Edwards Sapien prosthetic, stented (TAVR) valve is present in the aortic position. Procedure Date: 08/25/22.  Sonographer:    Darlys Gales Referring Phys: [id number] Laydon Martis R Mariane Burpee  IMPRESSIONS   1. Left ventricular ejection fraction, by estimation, is 50  to 55%. The left ventricle has low normal function. The left ventricle has no regional wall motion abnormalities. There is mild concentric left ventricular hypertrophy. Left ventricular diastolic parameters are consistent with Grade II diastolic dysfunction (pseudonormalization). Elevated left atrial pressure. 2. Right ventricular systolic function is mildly reduced. The right ventricular size is mildly enlarged. Tricuspid regurgitation signal is inadequate for assessing PA pressure. 3. Left atrial size was mildly dilated. 4. The mitral valve is normal in structure. No evidence of mitral valve regurgitation. No evidence of mitral stenosis. The mean mitral valve gradient is 3.9 mmHg with average heart rate of 84 bpm. Moderate mitral annular calcification. 5. The aortic valve has been repaired/replaced. Aortic valve regurgitation is not visualized. There is a 26 mm Edwards Sapien prosthetic (TAVR) valve present in the aortic position. Procedure Date: 08/25/22. Echo findings  are consistent with normal structure and function of the aortic valve prosthesis. Aortic valve mean gradient measures 5.0 mmHg. Aortic valve Vmax measures 1.58 m/s. Aortic valve acceleration time measures 81 msec. 6. The inferior vena cava is dilated in size with >50% respiratory variability, suggesting right atrial pressure of 8 mmHg.  FINDINGS Left Ventricle: Left ventricular ejection fraction, by estimation, is 50 to 55%. The left ventricle has low normal function. The left ventricle has no regional wall motion abnormalities. The left ventricular internal cavity size was normal in size. There is mild concentric left ventricular hypertrophy. Abnormal (paradoxical) septal motion, consistent with RV pacemaker. Left ventricular diastolic parameters are consistent with Grade II diastolic dysfunction (pseudonormalization). Elevated left atrial pressure.   LV Wall Scoring: The basal inferolateral segment and basal inferior segment are hypokinetic.  Right Ventricle: The right ventricular size is mildly enlarged. No increase in right ventricular wall thickness. Right ventricular systolic function is mildly reduced. Tricuspid regurgitation signal is inadequate for assessing PA pressure.  Left Atrium: Left atrial size was mildly dilated.  Right Atrium: Right atrial size was normal in size.  Pericardium: There is no evidence of pericardial effusion.  Mitral Valve: The mitral valve is normal in structure. Moderate mitral annular calcification. No evidence of mitral valve regurgitation. No evidence of mitral valve stenosis. MV peak gradient, 8.7 mmHg. The mean mitral valve gradient is 3.9 mmHg with average heart rate of 84 bpm.  Tricuspid Valve: The tricuspid valve is normal in structure. Tricuspid valve regurgitation is mild.  Aortic Valve: The aortic valve has been repaired/replaced. Aortic valve regurgitation is not visualized. Aortic valve mean gradient measures 5.0 mmHg. Aortic valve peak  gradient measures 10.0 mmHg. Aortic valve area, by VTI measures 2.47 cm. There is a 26 mm Edwards Sapien prosthetic, stented (TAVR) valve present in the aortic position. Procedure Date: 08/25/22. Echo findings are consistent with normal structure and function of the aortic valve prosthesis.  Pulmonic Valve: The pulmonic valve was not well visualized. Pulmonic valve regurgitation is not visualized.  Aorta: The aortic root is normal in size and structure.  Venous: The inferior vena cava is dilated in size with greater than 50% respiratory variability, suggesting right atrial pressure of 8 mmHg.  IAS/Shunts: No atrial level shunt detected by color flow Doppler.  Additional Comments: A device lead is visualized in the right ventricle.   LEFT VENTRICLE PLAX 2D LVIDd:         4.60 cm LVIDs:         3.60 cm LV PW:         1.30 cm LV IVS:        1.50 cm LVOT  diam:     1.80 cm LV SV:         66 LV SV Index:   30 LVOT Area:     2.54 cm   RIGHT VENTRICLE RV S prime:     9.68 cm/s TAPSE (M-mode): 1.7 cm  LEFT ATRIUM             Index        RIGHT ATRIUM           Index LA Vol (A2C):   61.8 ml 28.09 ml/m  RA Area:     15.00 cm LA Vol (A4C):   61.4 ml 27.90 ml/m  RA Volume:   39.10 ml  17.77 ml/m LA Biplane Vol: 64.5 ml 29.31 ml/m AORTIC VALVE AV Area (Vmax):    2.22 cm AV Area (Vmean):   2.32 cm AV Area (VTI):     2.47 cm AV Vmax:           158.00 cm/s AV Vmean:          101.000 cm/s AV VTI:            0.269 m AV Peak Grad:      10.0 mmHg AV Mean Grad:      5.0 mmHg LVOT Vmax:         138.00 cm/s LVOT Vmean:        92.000 cm/s LVOT VTI:          0.261 m LVOT/AV VTI ratio: 0.97  AORTA Ao Root diam: 2.70 cm Ao Asc diam:  3.60 cm  MITRAL VALVE MV Area (PHT): 3.28 cm     SHUNTS MV Area VTI:   1.85 cm     Systemic VTI:  0.26 m MV Peak grad:  8.7 mmHg     Systemic Diam: 1.80 cm MV Mean grad:  3.9 mmHg MV Vmax:       1.47 m/s MV Vmean:      111.0 cm/s MV Decel Time:  231 msec MV E velocity: 133.00 cm/s MV A velocity: 110.00 cm/s MV E/A ratio:  1.21  Mihai Croitoru MD Electronically signed by Thurmon Fair MD Signature Date/Time: 08/26/2022/12:18:47 PM    Final   TEE  ECHO INTRAOPERATIVE TEE 12/05/2018  Narrative *INTRAOPERATIVE TRANSESOPHAGEAL REPORT *    Patient Name:   KAROL LIENDO Date of Exam: 12/05/2018 Medical Rec #:  [id number]      Height:       71.5 in Accession #:    [id number]     Weight:       235.9 lb Date of Birth:  29-Jan-1941       BSA:          2.27 m Patient Age:    78 years       BP:           131/64 mmHg Patient Gender: M              HR:           65 bpm. Exam Location:  Anesthesiology  Transesophogeal exam was perform intraoperatively during surgical procedure. Patient was closely monitored under general anesthesia during the entirety of examination.  Indications:     I25.700 Atherosclerosis of coronary artery bypass graft(s), unspecified, with unstable angina pectoris History:         CAD and Previous Myocardial Infarction, Abnormal ECG and Pacemaker, Aortic Valve Disease; Arrythmias:Atrial Fibrillation Signs/Symptoms:Chest Pain Risk Factors:Hypertension, Diabetes, Sleep Apnea and Dyslipidemia. Performing Phys: [id number] Ephriam Knuckles Z  ATKINS Diagnosing Phys: Leslye Peer MD  Complications: No known complications during this procedure. POST-OP IMPRESSIONS - Left Ventricle: The left ventricle is unchanged from pre-bypass. - Right Ventricle: The right ventricle appears unchanged from pre-bypass. - Aorta: The aorta appears unchanged from pre-bypass. - Left Atrium: The left atrium appears unchanged from pre-bypass. - Left Atrial Appendage: The left atrial appendage appears unchanged from pre-bypass. - Aortic Valve: The aortic valve appears unchanged from pre-bypass. - Mitral Valve: The mitral valve appears unchanged from pre-bypass. - Tricuspid Valve: The tricuspid valve appears unchanged from pre-bypass. -  Interatrial Septum: The interatrial septum appears unchanged from pre-bypass. - Interventricular Septum: The interventricular septum appears unchanged from pre-bypass. - Pericardium: The pericardium appears unchanged from pre-bypass.  PRE-OP FINDINGS Left Ventricle: The left ventricle has normal systolic function, with an ejection fraction of 55-60%. The cavity size was normal. There is mild upper septal hypertrophy.  Right Ventricle: The right ventricle has normal systolic function. The cavity was normal. There is no increase in right ventricular wall thickness.  Left Atrium: Left atrial size was dilated. There is continuous echo contrast seen in the left atrial cavity. The left atrial appendage is well visualized and there is no evidence of thrombus present.  Right Atrium: Right atrial size was normal in size. Right atrial pressure is estimated at 10 mmHg.  Interatrial Septum: The interatrial septum was not well visualized.  Pericardium: There is no evidence of pericardial effusion.  Mitral Valve: The mitral valve is normal in structure. Mitral valve regurgitation is not visualized by color flow Doppler.  Tricuspid Valve: The tricuspid valve was not well visualized. Tricuspid valve regurgitation was not visualized by color flow Doppler.  Aortic Valve: The aortic valve is tricuspid Aortic valve regurgitation was not visualized by color flow Doppler. There is no evidence of aortic valve stenosis.  Pulmonic Valve: The pulmonic valve was not well visualized. Pulmonic valve regurgitation was not assessed by color flow Doppler.   Aorta: The aortic root, ascending aorta and aortic arch are normal in size and structure. There is evidence of immobile plaque in the descending aorta.   Leslye Peer MD Electronically signed by Leslye Peer MD Signature Date/Time: 12/05/2018/12:44:24 PM    Final    CT SCANS  CT CORONARY MORPH W/CTA COR W/SCORE 08/14/2022  Addendum 08/14/2022  1:13  PM ADDENDUM REPORT: 08/14/2022 13:11  FINDINGS: Extracardiac findings will be described separately under dictation for contemporaneously obtained CTA chest, abdomen and pelvis.  IMPRESSION: Please see separate dictation for contemporaneously obtained CTA chest, abdomen and pelvis dated 08/14/2022 for full description of relevant extracardiac findings.   Electronically Signed By: Allegra Lai M.D. On: 08/14/2022 13:11  Narrative CLINICAL DATA:  Severe Aortic Stenosis.  EXAM: Cardiac TAVR CT  TECHNIQUE: A non-contrast, gated CT scan was obtained with axial slices of 3 mm through the heart for aortic valve calcium scoring. A 120 kV retrospective, gated, contrast cardiac scan was obtained. Gantry rotation speed was 250 msecs and collimation was 0.6 mm. Nitroglycerin was not given. The 3D data set was reconstructed in 5% intervals of the 0-95% of the R-R cycle. Systolic and diastolic phases were analyzed on a dedicated workstation using MPR, MIP, and VRT modes. The patient received 100 cc of contrast.  FINDINGS: Image quality: Excellent.  Noise artifact is: Limited.  Valve Morphology: Tricuspid aortic valve with diffuse severe calcifications. Bulky calcifications of the NCC/LCC.  Aortic Valve Calcium score: 2947  Aortic annular dimension:  Phase assessed: 35%  Annular area: 491  mm2  Annular perimeter: 80.7 mm  Max diameter: 28.6 mm  Min diameter: 22.5 mm  Annular and subannular calcification: No annular calcium. Mild subannular calcium under the LCC.  Membranous septum length: 9.6 mm  Optimal coplanar projection: LAO 10 CAU 12  Coronary Artery Height above Annulus:  Left Main: 9.8 mm  Right Coronary: 17.1 mm  Sinus of Valsalva Measurements:  Non-coronary: 34 mm  Right-coronary: 34 mm  Left-coronary: 35 mm  Sinus of Valsalva Height:  Non-coronary: 25.0 mm  Right-coronary: 21.7 mm  Left-coronary: 20.8 mm  Sinotubular Junction: 30  mm  Ascending Thoracic Aorta: 34 mm  Coronary Arteries: Normal coronary origin. Left dominance. The study was performed without use of NTG and is insufficient for plaque evaluation. Please refer to recent cardiac catheterization for coronary assessment. S/p CABG.  Cardiac Morphology:  Right Atrium: Right atrial size is within normal limits. CIED leads noted in the RA/RV.  Right Ventricle: The right ventricular cavity is dilated.  Left Atrium: Left atrial size is normal in size. The LAA has been closed with a surgical clip.  Left Ventricle: The ventricular cavity size is within normal limits. Evidence of basal to mid inferolateral infarction.  Pulmonary arteries: Dilated pulmonary artery suggestive of pulmonary hypertension.  Pulmonary veins: Normal pulmonary venous drainage.  Pericardium: Normal thickness with no significant effusion or calcium present.  Mitral Valve: The mitral valve is normal structure without significant calcification.  Extra-cardiac findings: See attached radiology report for non-cardiac structures.  IMPRESSION: 1. Annular measurements support a 26 mm S3 or 29 mm Evolut Pro.  2. No significant annular calcifications. Mild subannular calcium under the LCC.  3. Shallow coronary height for the left main, but large sinuses noted.  4. Optimal Fluoroscopic Angle for Delivery: LAO 10 CAU 12  5. S/p CABG. Evidence of basal to mid inferolateral infarction.  6. Dilated pulmonary artery suggestive of pulmonary hypertension.  Gerri Spore T. Flora Lipps, MD  Electronically Signed: By: Lennie Odor M.D. On: 08/14/2022 12:56          EKG:  EKG is  ordered today.  The ekg ordered today demonstrates paced rhythm  Recent Labs: 04/17/2022: B Natriuretic Peptide 1,182.0 08/21/2022: ALT 34 08/26/2022: BUN 45; Creatinine, Ser 6.49; Hemoglobin 8.1; Magnesium 2.2; Platelets 114; Potassium 5.1; Sodium 138  Recent Lipid Panel    Component Value Date/Time   CHOL 85  12/02/2018 0333   TRIG 145 12/02/2018 0333   HDL 25 (L) 12/02/2018 0333   CHOLHDL 3.4 12/02/2018 0333   VLDL 29 12/02/2018 0333   LDLCALC 31 12/02/2018 0333   LDLDIRECT 50.5 10/10/2013 1017     Risk Assessment/Calculations:       Physical Exam:    VS:  BP 130/74   Pulse 82   Ht 5\' 11"  (1.803 m)   Wt 218 lb 3.2 oz (99 kg)   SpO2 98%   BMI 30.43 kg/m     Wt Readings from Last 3 Encounters:  09/02/22 218 lb 3.2 oz (99 kg)  08/26/22 221 lb 1.6 oz (100.3 kg)  08/21/22 220 lb (99.8 kg)     GEN:  Well nourished, well developed in no acute distress HEENT: Normal NECK: No JVD LYMPHATICS: No lymphadenopathy CARDIAC: RRR, no murmurs, rubs, gallops RESPIRATORY:  Clear to auscultation without rales, wheezing or rhonchi  ABDOMEN: Soft, non-tender, non-distended MUSCULOSKELETAL:  No edema; No deformity  SKIN: Warm and dry.  Groin sites clear without hematoma or ecchymosis  NEUROLOGIC:  Alert and oriented x 3 PSYCHIATRIC:  Normal affect   ASSESSMENT:    1. S/P TAVR (transcatheter aortic valve replacement)   2. Hypertension, unspecified type   3. ESRD (end stage renal disease) (HCC)   4. Coronary artery disease involving native coronary artery of native heart without angina pectoris   5. Presence of permanent cardiac pacemaker   6. Pulmonary nodule   7. Solitary pulmonary nodule    PLAN:    In order of problems listed above:  Severe AS s/p TAVR: doing well 1 week out from TAVR. ECG with paced rhythm. Groin sites healing well. Continue on Asprin 81mg  daily. SBE prophylaxis discussed; I have RX'd amoxicillin. I will see him back in 1 month for follow up and echo.    HTN: BP well controlled. No changes made.    ESRD on HD: on T, Th, Sat.     CAD: cardiac catheterization 07/31/22 showed severe native three-vessel coronary disease with 4/4 patent grafts. Continue medical therapy.    SSS s/p PPM: has not seen EP in a long time. Device was interrogated by [employer] reps on the  day of surgery. I have gotten him in to see Dr. Nelly Laurence next month.    Pulmonary nodule: pre TAVR CT showed "part solid nodule of the left upper lobe is stable when compared with most recent prior exam, however small solid component is new when compared with 12/01/2018 prior. 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 Follow-up chest CT is recommended in 6 months."  I will get this set up.   Medication Adjustments/Labs and Tests Ordered: Current medicines are reviewed at length with the patient today.  Concerns regarding medicines are outlined above.  Orders Placed This Encounter  Procedures   CT Chest Wo Contrast   EKG 12-Lead   Meds ordered this encounter  Medications   amoxicillin (AMOXIL) 500 MG tablet    Sig: Take 4 tablets (2,000 mg total) by mouth as directed. 1 hour prior to dental work including cleanings    Dispense:  12 tablet    Refill:  12    Order Specific Question:   Supervising Provider    Answer:   Tonny Bollman [[id number]]    Patient Instructions  Medication Instructions:  No changes *If you need a refill on your cardiac medications before your next appointment, please call your pharmacy*   Lab Work: none If you have labs (blood work) drawn today and your tests are completely normal, you will receive your results only by: MyChart Message (if you have MyChart) OR A paper copy in the mail If you have any lab test that is abnormal or we need to change your treatment, we will call you to review the results.   Testing/Procedures: Chest CT scan without contrast - due Dec 2024   Follow-Up: As planned - see below     Signed, Cline Crock, PA-C  09/02/2022 3:34 PM    [facility]

## 2022-09-02 ENCOUNTER — Ambulatory Visit: Payer: Medicare Other | Attending: Physician Assistant | Admitting: Physician Assistant

## 2022-09-02 VITALS — BP 130/74 | HR 82 | Ht 71.0 in | Wt 218.2 lb

## 2022-09-02 DIAGNOSIS — N186 End stage renal disease: Secondary | ICD-10-CM | POA: Diagnosis not present

## 2022-09-02 DIAGNOSIS — Z952 Presence of prosthetic heart valve: Secondary | ICD-10-CM | POA: Diagnosis not present

## 2022-09-02 DIAGNOSIS — I2510 Atherosclerotic heart disease of native coronary artery without angina pectoris: Secondary | ICD-10-CM

## 2022-09-02 DIAGNOSIS — I10 Essential (primary) hypertension: Secondary | ICD-10-CM

## 2022-09-02 DIAGNOSIS — Z950 Presence of cardiac pacemaker: Secondary | ICD-10-CM

## 2022-09-02 DIAGNOSIS — R911 Solitary pulmonary nodule: Secondary | ICD-10-CM | POA: Diagnosis not present

## 2022-09-02 MED ORDER — AMOXICILLIN 500 MG PO TABS: 2000.0000 mg | ORAL_TABLET | ORAL | 12 refills | Status: DC

## 2022-09-02 NOTE — Patient Instructions (Addendum)
Medication Instructions:  No changes *If you need a refill on your cardiac medications before your next appointment, please call your pharmacy*   Lab Work: none If you have labs (blood work) drawn today and your tests are completely normal, you will receive your results only by: MyChart Message (if you have MyChart) OR A paper copy in the mail If you have any lab test that is abnormal or we need to change your treatment, we will call you to review the results.   Testing/Procedures: Chest CT scan without contrast - due Dec 2024   Follow-Up: As planned - see below

## 2022-09-03 DIAGNOSIS — N2581 Secondary hyperparathyroidism of renal origin: Secondary | ICD-10-CM | POA: Diagnosis not present

## 2022-09-03 DIAGNOSIS — D631 Anemia in chronic kidney disease: Secondary | ICD-10-CM | POA: Diagnosis not present

## 2022-09-03 DIAGNOSIS — Z992 Dependence on renal dialysis: Secondary | ICD-10-CM | POA: Diagnosis not present

## 2022-09-03 DIAGNOSIS — N186 End stage renal disease: Secondary | ICD-10-CM | POA: Diagnosis not present

## 2022-09-03 DIAGNOSIS — E1122 Type 2 diabetes mellitus with diabetic chronic kidney disease: Secondary | ICD-10-CM | POA: Diagnosis not present

## 2022-09-05 DIAGNOSIS — N186 End stage renal disease: Secondary | ICD-10-CM | POA: Diagnosis not present

## 2022-09-05 DIAGNOSIS — N2581 Secondary hyperparathyroidism of renal origin: Secondary | ICD-10-CM | POA: Diagnosis not present

## 2022-09-05 DIAGNOSIS — Z992 Dependence on renal dialysis: Secondary | ICD-10-CM | POA: Diagnosis not present

## 2022-09-05 DIAGNOSIS — E1122 Type 2 diabetes mellitus with diabetic chronic kidney disease: Secondary | ICD-10-CM | POA: Diagnosis not present

## 2022-09-05 DIAGNOSIS — D631 Anemia in chronic kidney disease: Secondary | ICD-10-CM | POA: Diagnosis not present

## 2022-09-08 DIAGNOSIS — Z992 Dependence on renal dialysis: Secondary | ICD-10-CM | POA: Diagnosis not present

## 2022-09-08 DIAGNOSIS — N2581 Secondary hyperparathyroidism of renal origin: Secondary | ICD-10-CM | POA: Diagnosis not present

## 2022-09-08 DIAGNOSIS — E1122 Type 2 diabetes mellitus with diabetic chronic kidney disease: Secondary | ICD-10-CM | POA: Diagnosis not present

## 2022-09-08 DIAGNOSIS — N186 End stage renal disease: Secondary | ICD-10-CM | POA: Diagnosis not present

## 2022-09-08 DIAGNOSIS — D631 Anemia in chronic kidney disease: Secondary | ICD-10-CM | POA: Diagnosis not present

## 2022-09-10 DIAGNOSIS — D631 Anemia in chronic kidney disease: Secondary | ICD-10-CM | POA: Diagnosis not present

## 2022-09-10 DIAGNOSIS — E1122 Type 2 diabetes mellitus with diabetic chronic kidney disease: Secondary | ICD-10-CM | POA: Diagnosis not present

## 2022-09-10 DIAGNOSIS — Z992 Dependence on renal dialysis: Secondary | ICD-10-CM | POA: Diagnosis not present

## 2022-09-10 DIAGNOSIS — N186 End stage renal disease: Secondary | ICD-10-CM | POA: Diagnosis not present

## 2022-09-10 DIAGNOSIS — N2581 Secondary hyperparathyroidism of renal origin: Secondary | ICD-10-CM | POA: Diagnosis not present

## 2022-09-12 DIAGNOSIS — Z992 Dependence on renal dialysis: Secondary | ICD-10-CM | POA: Diagnosis not present

## 2022-09-12 DIAGNOSIS — N186 End stage renal disease: Secondary | ICD-10-CM | POA: Diagnosis not present

## 2022-09-12 DIAGNOSIS — D631 Anemia in chronic kidney disease: Secondary | ICD-10-CM | POA: Diagnosis not present

## 2022-09-12 DIAGNOSIS — E1122 Type 2 diabetes mellitus with diabetic chronic kidney disease: Secondary | ICD-10-CM | POA: Diagnosis not present

## 2022-09-12 DIAGNOSIS — N2581 Secondary hyperparathyroidism of renal origin: Secondary | ICD-10-CM | POA: Diagnosis not present

## 2022-09-14 ENCOUNTER — Ambulatory Visit (HOSPITAL_COMMUNITY)
Admission: RE | Admit: 2022-09-14 | Discharge: 2022-09-14 | Disposition: A | Discharge status: Home / Self Care | Payer: Medicare Other | Source: Ambulatory Visit | Attending: Physician Assistant | Admitting: Physician Assistant

## 2022-09-14 DIAGNOSIS — J432 Centrilobular emphysema: Secondary | ICD-10-CM | POA: Diagnosis not present

## 2022-09-14 DIAGNOSIS — R911 Solitary pulmonary nodule: Secondary | ICD-10-CM | POA: Diagnosis not present

## 2022-09-14 DIAGNOSIS — I700 Atherosclerosis of aorta: Secondary | ICD-10-CM | POA: Diagnosis not present

## 2022-09-14 NOTE — Progress Notes (Signed)
Remote pacemaker transmission.   

## 2022-09-15 DIAGNOSIS — E1122 Type 2 diabetes mellitus with diabetic chronic kidney disease: Secondary | ICD-10-CM | POA: Diagnosis not present

## 2022-09-15 DIAGNOSIS — D631 Anemia in chronic kidney disease: Secondary | ICD-10-CM | POA: Diagnosis not present

## 2022-09-15 DIAGNOSIS — N186 End stage renal disease: Secondary | ICD-10-CM | POA: Diagnosis not present

## 2022-09-15 DIAGNOSIS — Z992 Dependence on renal dialysis: Secondary | ICD-10-CM | POA: Diagnosis not present

## 2022-09-15 DIAGNOSIS — N2581 Secondary hyperparathyroidism of renal origin: Secondary | ICD-10-CM | POA: Diagnosis not present

## 2022-09-17 DIAGNOSIS — D509 Iron deficiency anemia, unspecified: Secondary | ICD-10-CM | POA: Diagnosis not present

## 2022-09-17 DIAGNOSIS — I129 Hypertensive chronic kidney disease with stage 1 through stage 4 chronic kidney disease, or unspecified chronic kidney disease: Secondary | ICD-10-CM | POA: Diagnosis not present

## 2022-09-17 DIAGNOSIS — D631 Anemia in chronic kidney disease: Secondary | ICD-10-CM | POA: Diagnosis not present

## 2022-09-17 DIAGNOSIS — E1122 Type 2 diabetes mellitus with diabetic chronic kidney disease: Secondary | ICD-10-CM | POA: Diagnosis not present

## 2022-09-17 DIAGNOSIS — N186 End stage renal disease: Secondary | ICD-10-CM | POA: Diagnosis not present

## 2022-09-17 DIAGNOSIS — Z992 Dependence on renal dialysis: Secondary | ICD-10-CM | POA: Diagnosis not present

## 2022-09-17 DIAGNOSIS — N2581 Secondary hyperparathyroidism of renal origin: Secondary | ICD-10-CM | POA: Diagnosis not present

## 2022-09-19 DIAGNOSIS — N186 End stage renal disease: Secondary | ICD-10-CM | POA: Diagnosis not present

## 2022-09-19 DIAGNOSIS — N2581 Secondary hyperparathyroidism of renal origin: Secondary | ICD-10-CM | POA: Diagnosis not present

## 2022-09-19 DIAGNOSIS — D631 Anemia in chronic kidney disease: Secondary | ICD-10-CM | POA: Diagnosis not present

## 2022-09-19 DIAGNOSIS — D509 Iron deficiency anemia, unspecified: Secondary | ICD-10-CM | POA: Diagnosis not present

## 2022-09-19 DIAGNOSIS — E1122 Type 2 diabetes mellitus with diabetic chronic kidney disease: Secondary | ICD-10-CM | POA: Diagnosis not present

## 2022-09-19 DIAGNOSIS — Z992 Dependence on renal dialysis: Secondary | ICD-10-CM | POA: Diagnosis not present

## 2022-09-21 ENCOUNTER — Telehealth: Payer: Self-pay | Admitting: Physician Assistant

## 2022-09-21 DIAGNOSIS — R911 Solitary pulmonary nodule: Secondary | ICD-10-CM

## 2022-09-21 NOTE — Telephone Encounter (Signed)
-----   Message from Cline Crock sent at 09/21/2022  1:38 PM EDT ----- Lung nodule looks stable but needs follow up scan in 3 months to make sure. Will have this set up.   Can you call patient and help set him up for a repeat non contrast CT in 3 months.?

## 2022-09-21 NOTE — Telephone Encounter (Signed)
Discussed CT results with patient's wife Huntley Dec (OK per [facility]).  ----- Message from Cline Crock sent at 09/21/2022  1:38 PM EDT ----- Lung nodule looks stable but needs follow up scan in 3 months to make sure. Will have this set up.    Can you call patient and help set him up for a repeat non contrast CT in 3 months.?    CT scan with no contrast ordered. Scheduler to call and schedule appt with patient.  Huntley Dec verbalized understanding and expressed appreciation for call.

## 2022-09-22 DIAGNOSIS — Z992 Dependence on renal dialysis: Secondary | ICD-10-CM | POA: Diagnosis not present

## 2022-09-22 DIAGNOSIS — D509 Iron deficiency anemia, unspecified: Secondary | ICD-10-CM | POA: Diagnosis not present

## 2022-09-22 DIAGNOSIS — N2581 Secondary hyperparathyroidism of renal origin: Secondary | ICD-10-CM | POA: Diagnosis not present

## 2022-09-22 DIAGNOSIS — D631 Anemia in chronic kidney disease: Secondary | ICD-10-CM | POA: Diagnosis not present

## 2022-09-22 DIAGNOSIS — N186 End stage renal disease: Secondary | ICD-10-CM | POA: Diagnosis not present

## 2022-09-22 DIAGNOSIS — E1122 Type 2 diabetes mellitus with diabetic chronic kidney disease: Secondary | ICD-10-CM | POA: Diagnosis not present

## 2022-09-25 ENCOUNTER — Ambulatory Visit: Payer: Medicare Other | Admitting: Cardiovascular Disease

## 2022-09-25 ENCOUNTER — Encounter: Payer: Self-pay | Admitting: Cardiovascular Disease

## 2022-09-25 VITALS — BP 148/76 | HR 94 | Ht 71.0 in | Wt 220.8 lb

## 2022-09-25 DIAGNOSIS — I480 Paroxysmal atrial fibrillation: Secondary | ICD-10-CM | POA: Diagnosis not present

## 2022-09-25 DIAGNOSIS — I495 Sick sinus syndrome: Secondary | ICD-10-CM

## 2022-09-25 LAB — CUP PACEART INCLINIC DEVICE CHECK
Date Time Interrogation Session: 20240809100106
Implantable Lead Connection Status: 753985
Implantable Lead Connection Status: 753985
Implantable Lead Implant Date: 20020325
Implantable Lead Implant Date: 20020325
Implantable Lead Location: 753859
Implantable Lead Location: 753860
Implantable Lead Model: 4092
Implantable Lead Model: 4524
Implantable Pulse Generator Implant Date: 20210415

## 2022-09-25 NOTE — Patient Instructions (Signed)

## 2022-09-25 NOTE — Progress Notes (Signed)
  Electrophysiology Office Note:    Date:  09/25/2022   ID:  Tina Wlodarczyk, DOB 1940/06/29, MRN [id number]  PCP:  Alysia Penna, MD   [facility] Providers Cardiologist:  Kristeen Miss, MD     Referring MD: Alysia Penna, MD   History of Present Illness:    Bradley Soto is a 82 y.o. male with a medical history significant for CAD status post CABG 11/26/2018, ESRD on HD, sick sinus syndrome status post pacemaker, paroxysmal atrial fibrillation, who presents for electrophysiology follow-up.     He has a medtronic dual chamber pacemaker placed for sick sinus syndrome.  He underwent a TAVR in July 2020 for severe aortic stenosis.  Since the TAVR, he has had complete heart block and has been pacemaker dependent.      Today, he reports that he has had chest pain this morning.  This occurred after he took a shower and sat in his recliner.  He did not take nitroglycerin.  The pain is now resolved.  he has no device related complaints -- no new tenderness, drainage, redness.   EKGs/Labs/Other Studies Reviewed Today:    Echocardiogram:  TTE August 26, 2022 EF 50 to 55%.  Grade 2 diastolic dysfunction.  Mildly dilated left atrium.  Normal mitral valve structure and function.  Aortic valve has been replaced with normal structure and function.   Monitors:   Stress testing:   Advanced imaging:  Cardiac CT 08/14/2022 S/p CABG -- pre TAVR study  Cardiac catherization   EKG:   EKG Interpretation Date/Time:  Friday September 25 2022 09:25:36 EDT Ventricular Rate:  94 PR Interval:  186 QRS Duration:  178 QT Interval:  410 QTC Calculation: 512 R Axis:   -82  Text Interpretation: Atrial-sensed ventricular-paced rhythm When compared with ECG of 02-Sep-2022 13:37, Vent. rate has increased BY  12 BPM Confirmed by York Pellant [phone]) on 09/25/2022 9:40:41 AM     Physical Exam:    VS:  BP (!) 180/80 (BP Location: Left Arm, Patient Position: Sitting, Cuff Size: Large)    Pulse 94   Ht 5\' 11"  (1.803 m)   Wt 220 lb 12.8 oz (100.2 kg)   SpO2 95%   BMI 30.80 kg/m     Wt Readings from Last 3 Encounters:  09/25/22 220 lb 12.8 oz (100.2 kg)  09/02/22 218 lb 3.2 oz (99 kg)  08/26/22 221 lb 1.6 oz (100.3 kg)     GEN:  Well nourished, well developed in no acute distress CARDIAC: RRR, no murmurs, rubs, gallops RESPIRATORY:  Normal work of breathing MUSCULOSKELETAL: no edema  Medtronic dual-chamber pacemaker interrogated in clinic today.  I reviewed the interrogation in detail.  See Paceart for report  ASSESSMENT & PLAN:    Sick sinus syndrome, complete heart block Medtronic dual-chamber pacemaker in place, normal function He is device dependent today See Paceart for report Continue routine follow-up  Chest pain Resolved currently ECG is paced I advised him to take his nitroglycerin and return to the ER if he has recurrence of chest pain  S/P TAVR Valve is functioning normally on postprocedure echo  End-stage renal disease Following with nephrology, on dialysis  Hypertension Recheck BP 148/78 -- no changes today    Signed, Maurice Small, MD  09/25/2022 9:41 AM    [facility]

## 2022-09-26 DIAGNOSIS — E1122 Type 2 diabetes mellitus with diabetic chronic kidney disease: Secondary | ICD-10-CM | POA: Diagnosis not present

## 2022-09-26 DIAGNOSIS — D509 Iron deficiency anemia, unspecified: Secondary | ICD-10-CM | POA: Diagnosis not present

## 2022-09-26 DIAGNOSIS — N186 End stage renal disease: Secondary | ICD-10-CM | POA: Diagnosis not present

## 2022-09-26 DIAGNOSIS — N2581 Secondary hyperparathyroidism of renal origin: Secondary | ICD-10-CM | POA: Diagnosis not present

## 2022-09-26 DIAGNOSIS — D631 Anemia in chronic kidney disease: Secondary | ICD-10-CM | POA: Diagnosis not present

## 2022-09-26 DIAGNOSIS — Z992 Dependence on renal dialysis: Secondary | ICD-10-CM | POA: Diagnosis not present

## 2022-09-29 DIAGNOSIS — N2581 Secondary hyperparathyroidism of renal origin: Secondary | ICD-10-CM | POA: Diagnosis not present

## 2022-09-29 DIAGNOSIS — D509 Iron deficiency anemia, unspecified: Secondary | ICD-10-CM | POA: Diagnosis not present

## 2022-09-29 DIAGNOSIS — E1122 Type 2 diabetes mellitus with diabetic chronic kidney disease: Secondary | ICD-10-CM | POA: Diagnosis not present

## 2022-09-29 DIAGNOSIS — D631 Anemia in chronic kidney disease: Secondary | ICD-10-CM | POA: Diagnosis not present

## 2022-09-29 DIAGNOSIS — Z992 Dependence on renal dialysis: Secondary | ICD-10-CM | POA: Diagnosis not present

## 2022-09-29 DIAGNOSIS — N186 End stage renal disease: Secondary | ICD-10-CM | POA: Diagnosis not present

## 2022-10-01 DIAGNOSIS — N2581 Secondary hyperparathyroidism of renal origin: Secondary | ICD-10-CM | POA: Diagnosis not present

## 2022-10-01 DIAGNOSIS — D509 Iron deficiency anemia, unspecified: Secondary | ICD-10-CM | POA: Diagnosis not present

## 2022-10-01 DIAGNOSIS — N186 End stage renal disease: Secondary | ICD-10-CM | POA: Diagnosis not present

## 2022-10-01 DIAGNOSIS — D631 Anemia in chronic kidney disease: Secondary | ICD-10-CM | POA: Diagnosis not present

## 2022-10-01 DIAGNOSIS — E1122 Type 2 diabetes mellitus with diabetic chronic kidney disease: Secondary | ICD-10-CM | POA: Diagnosis not present

## 2022-10-01 DIAGNOSIS — Z992 Dependence on renal dialysis: Secondary | ICD-10-CM | POA: Diagnosis not present

## 2022-10-02 ENCOUNTER — Ambulatory Visit: Payer: Medicare Other | Admitting: Physician Assistant

## 2022-10-02 ENCOUNTER — Ambulatory Visit (HOSPITAL_COMMUNITY): Payer: Medicare Other | Attending: Cardiology

## 2022-10-02 VITALS — BP 148/68 | HR 80 | Ht 71.0 in | Wt 216.6 lb

## 2022-10-02 DIAGNOSIS — I2510 Atherosclerotic heart disease of native coronary artery without angina pectoris: Secondary | ICD-10-CM

## 2022-10-02 DIAGNOSIS — R911 Solitary pulmonary nodule: Secondary | ICD-10-CM | POA: Insufficient documentation

## 2022-10-02 DIAGNOSIS — Z950 Presence of cardiac pacemaker: Secondary | ICD-10-CM | POA: Insufficient documentation

## 2022-10-02 DIAGNOSIS — Z952 Presence of prosthetic heart valve: Secondary | ICD-10-CM | POA: Insufficient documentation

## 2022-10-02 DIAGNOSIS — N186 End stage renal disease: Secondary | ICD-10-CM | POA: Insufficient documentation

## 2022-10-02 DIAGNOSIS — I10 Essential (primary) hypertension: Secondary | ICD-10-CM | POA: Insufficient documentation

## 2022-10-02 LAB — ECHOCARDIOGRAM COMPLETE
AR max vel: 1.29 cm2
AV Area VTI: 1.34 cm2
AV Area mean vel: 1.3 cm2
AV Mean grad: 11 mmHg
AV Peak grad: 18.2 mmHg
Ao pk vel: 2.14 m/s
Area-P 1/2: 3.61 cm2
Est EF: 50
S' Lateral: 3.55 cm

## 2022-10-02 NOTE — Patient Instructions (Signed)
 Medication Instructions:  Your physician recommends that you continue on your current medications as directed. Please refer to the Current Medication list given to you today.  *If you need a refill on your cardiac medications before your next appointment, please call your pharmacy*   Lab Work: None ordered   If you have labs (blood work) drawn today and your tests are completely normal, you will receive your results only by: MyChart Message (if you have MyChart) OR A paper copy in the mail If you have any lab test that is abnormal or we need to change your treatment, we will call you to review the results.   Testing/Procedures: Follow up echocardiogram as scheduled    Follow-Up: Follow up as scheduled   Other Instructions

## 2022-10-02 NOTE — Progress Notes (Signed)
[facility]   MULTIDISCIPLINARY HEART VALVE CLINIC                                     Cardiology Office Note:    Date:  10/02/2022   ID:  Daryel Gerald, DOB 05/10/1940, MRN [id number]  PCP:  Alysia Penna, MD  [facility] Cardiologist:  Kristeen Miss, MD / Dr. Lynnette Caffey & Dr. Laneta Simmers (TAVR)  [facility] Electrophysiologist:  Maurice Small, MD   Referring MD: Alysia Penna, MD   1 month s/p TAVR  History of Present Illness:    Bradley Soto is a 82 y.o. male with a hx of CAD s/p previous PCI and ultimately CABG in 2020, ESRD on HD, PAF not on AC due to severe anemia, SSS s/p PPM placement, DM2, dilated ascending aorta, and severe aortic stenosis s/p TAVR (08/25/22) who presents to clinic for follow up.   He underwent multivessel CABG in 2020 by Dr. Vickey Sages with a LIMA to the LAD, SVG to the ramus and OM 3, and SVG to the diagonal branch. The distal right coronary artery branches were too small to bypass. He was started on HD in 2021. He has developed progressive exertional fatigue and shortness of breath. He developed hypotension during hemodialysis and intolerance to the runs. Echo 06/22/22 showed EF 55-60%, and severe AS with a mean grad 27.0 mmHg, peak grad 47.3 mmHg, AVA 0.87cm2, DVI 0.25, SVI 30. Cardiac catheterization 07/31/22 showed severe native three-vessel coronary disease with 4/4 patent grafts. The mean gradient across aortic valve was measured at 46 mmHg. He was seen in the ER on 08/05/22 for a fall and suffered a comminuted fracture of the right scapula.   He was evaluated by the multidisciplinary valve team and underwent successful TAVR with a 26 mm Edwards Sapien 3 Ultra Resilia THV via the TF approach on 08/25/22. Post operative echo showed EF 50-55%, normally functioning TAVR with a mean gradient of 5 mmHg and no PVL. He was continued on home Asprin.   Today the patient presents to clinic for follow up. Here with his wife. Still has some hypotension on HD  days. No CP. Has mild SOB with moderate exertion but much improved since TAVR. No LE edema, orthopnea or PND. No dizziness or syncope. No blood in stool or urine. No palpitations.    Past Medical History:  Diagnosis Date   Anxiety    Atrial fibrillation (HCC)    Bell's palsy    left side of face droops   CAD (coronary artery disease)    s/p stent placement   Colon polyps    ESRD (end stage renal disease) (HCC)    [place]- [facility]- [facility]   Gout    History of blood transfusion    History of kidney stones    Passes   HLD (hyperlipidemia)    HTN (hypertension)    Insomnia    MI (myocardial infarction) (HCC) 1998   Neuropathy    NSTEMI (non-ST elevated myocardial infarction) (HCC)    s/p PCI in early 2000s   OSA on CPAP    wears CPAP   Presence of permanent cardiac pacemaker    S/P TAVR (transcatheter aortic valve replacement) 08/25/2022   s/p TAVR with a 26 mm Edwards S3UR via the TF approach by Dr. Lynnette Caffey & Dr. Laneta Simmers   Severe aortic stenosis    Type II or unspecified type diabetes mellitus  without mention of complication, not stated as uncontrolled    Wears glasses    reading    Wears partial dentures     Past Surgical History:  Procedure Laterality Date   APPENDECTOMY     AV FISTULA PLACEMENT Right 07/28/2019   Procedure: RIGHT ARM Brachiocephalic ARTERIOVENOUS (AV) FISTULA CREATION;  Surgeon: Nada Libman, MD;  Location: [facility] OR;  Service: Vascular;  Laterality: Right;   CATARACT EXTRACTION     CLIPPING OF ATRIAL APPENDAGE  12/05/2018   Procedure: Clipping Of Atrial Appendage;  Surgeon: Linden Dolin, MD;  Location: [facility] OR;  Service: Open Heart Surgery;;   CORONARY ANGIOPLASTY WITH STENT PLACEMENT     x4   CORONARY ARTERY BYPASS GRAFT N/A 12/05/2018   Procedure: CORONARY ARTERY BYPASS GRAFTING (CABG) X FOUR USING LEFT INTERNAL MAMMARY ARTERY AND RIGHT SAPHENOUS VEIN GRAFTS;  Surgeon: Linden Dolin, MD;  Location: [facility] OR;  Service: Open Heart Surgery;   Laterality: N/A;   HERNIA REPAIR     ventral hernia repair with mesh--revision also was required   INSERT / REPLACE / REMOVE PACEMAKER  05/10/2000   pacemaker implanted in [place]]   INTRAOPERATIVE TRANSTHORACIC ECHOCARDIOGRAM N/A 08/25/2022   Procedure: INTRAOPERATIVE TRANSTHORACIC ECHOCARDIOGRAM;  Surgeon: Orbie Pyo, MD;  Location: [facility] INVASIVE CV LAB;  Service: Open Heart Surgery;  Laterality: N/A;   IR THORACENTESIS ASP PLEURAL SPACE W/IMG GUIDE  12/21/2018   IR THORACENTESIS ASP PLEURAL SPACE W/IMG GUIDE  12/30/2018   LEFT HEART CATH AND CORONARY ANGIOGRAPHY N/A 11/30/2018   Procedure: LEFT HEART CATH AND CORONARY ANGIOGRAPHY;  Surgeon: Kathleene Hazel, MD;  Location: [facility] INVASIVE CV LAB;  Service: Cardiovascular;  Laterality: N/A;   MAZE N/A 12/05/2018   Procedure: MAZE;  Surgeon: Linden Dolin, MD;  Location: [facility] OR;  Service: Open Heart Surgery;  Laterality: N/A;   PACEMAKER GENERATOR CHANGE  09/03/09   MDT Adapta generator change in [place]   PARTIAL COLON REMOVAL  2/2   polyp that could not be removed on colonoscopy   PPM GENERATOR CHANGEOUT N/A 06/01/2019   Procedure: PPM GENERATOR CHANGEOUT;  Surgeon: Hillis Range, MD;  Location: [facility] INVASIVE CV LAB;  Service: Cardiovascular;  Laterality: N/A;   REVISON OF ARTERIOVENOUS FISTULA Right 01/24/2020   Procedure: BANDING OF RIGHT ARM ARTERIOVENOUS FISTULA;  Surgeon: Nada Libman, MD;  Location: [facility] OR;  Service: Vascular;  Laterality: Right;   RIGHT/LEFT HEART CATH AND CORONARY/GRAFT ANGIOGRAPHY N/A 07/31/2022   Procedure: RIGHT/LEFT HEART CATH AND CORONARY/GRAFT ANGIOGRAPHY;  Surgeon: Marykay Lex, MD;  Location: [facility] INVASIVE CV LAB;  Service: Cardiovascular;  Laterality: N/A;   TEE WITHOUT CARDIOVERSION N/A 12/05/2018   Procedure: TRANSESOPHAGEAL ECHOCARDIOGRAM (TEE);  Surgeon: Linden Dolin, MD;  [facility] OR;  Service: Open Heart Surgery;  Laterality: N/A;   TRANSCATHETER AORTIC VALVE REPLACEMENT, TRANSFEMORAL  N/A 08/25/2022   Procedure: Transcatheter Aortic Valve Replacement, Transfemoral;  Surgeon: Orbie Pyo, MD;  Location: [facility] INVASIVE CV LAB;  Service: Open Heart Surgery;  Laterality: N/A;    Current Medications: Current Meds  Medication Sig   amoxicillin (AMOXIL) 500 MG tablet Take 4 tablets (2,000 mg total) by mouth as directed. 1 hour prior to dental work including cleanings   aspirin EC 81 MG tablet Take 81 mg by mouth in the morning.   B Complex-C-Zn-Folic Acid (DIALYVITE 800 WITH ZINC) 0.8 MG TABS Take 1 tablet by mouth at bedtime.   CALCITRIOL PO Take 1 Dose by mouth Every Tuesday,Thursday,and Saturday  with dialysis.   copper tablet Take 4 mg by mouth daily.   CVS VITAMIN B12 1000 MCG tablet TAKE 1 TABLET BY MOUTH EVERY DAY   diclofenac Sodium (VOLTAREN) 1 % GEL Apply 1 Application topically 4 (four) times daily as needed (neck pain).   diltiazem (CARDIZEM SR) 60 MG 12 hr capsule Take 1 capsule (60 mg total) by mouth as needed (palpitations).   gabapentin (NEURONTIN) 100 MG capsule Take 1 capsule (100 mg total) by mouth 3 (three) times daily. (Patient taking differently: Take 200 mg by mouth at bedtime.)   ibuprofen (ADVIL) 200 MG tablet Take 400 mg by mouth every 6 (six) hours as needed for moderate pain.   Methoxy PEG-Epoetin Beta (MIRCERA IJ) Mircera   Methoxy PEG-Epoetin Beta (MIRCERA IJ) Mircera   midodrine (PROAMATINE) 5 MG tablet Take 5 mg by mouth Every Tuesday,Thursday,and Saturday with dialysis.   nitroGLYCERIN (NITROSTAT) 0.4 MG SL tablet Place 1 tablet (0.4 mg total) under the tongue every 5 (five) minutes as needed for chest pain.   NOVOLOG MIX 70/30 FLEXPEN (70-30) 100 UNIT/ML FlexPen Inject 30 Units into the skin in the morning and at bedtime.   OVER THE COUNTER MEDICATION Take 1 tablet by mouth at bedtime as needed (sleep). Sleep-eze sleep aid   oxyCODONE-acetaminophen (PERCOCET/ROXICET) 5-325 MG tablet Take 1 tablet by mouth every 6 (six) hours as needed.   sevelamer  carbonate (RENVELA) 800 MG tablet Take 1 tablet (800 mg total) by mouth 3 (three) times daily with meals. (Patient taking differently: Take 2,400 mg by mouth 3 (three) times daily with meals.)   traMADol (ULTRAM) 50 MG tablet Take 50 mg by mouth daily as needed for moderate pain.     Allergies:   Shellfish allergy   Social History   Socioeconomic History   Marital status: Married    Spouse name: Not on file   Number of children: Not on file   Years of education: Not on file   Highest education level: Not on file  Occupational History   Not on file  Tobacco Use   Smoking status: Former    Types: Cigarettes   Smokeless tobacco: Never   Tobacco comments:    " many years ago"  Vaping Use   Vaping status: Never Used  Substance and Sexual Activity   Alcohol use: No   Drug use: No   Sexual activity: Not on file  Other Topics Concern   Not on file  Social History Narrative   Lives with wife   Caffeine coffee 2 daily   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   Social Determinants of Health   Financial Resource Strain: Not on file  Food Insecurity: Not on file  Transportation Needs: Not on file  Physical Activity: Not on file  Stress: Not on file  Social Connections: Not on file     Family History: The patient's family history includes Breast cancer in his sister; Deep vein thrombosis in his sister; Heart attack (age of onset: 53) in his father; Heart disease in his father; Pulmonary embolism in his sister.  ROS:   Please see the history of present illness.    All other systems reviewed and are negative.  EKGs/Labs/Other Studies Reviewed:    Cardiac Studies & Procedures   CARDIAC CATHETERIZATION  CARDIAC CATHETERIZATION 07/31/2022  Narrative   Prox LAD to Mid LAD lesion is 85% stenosed between SP1 & 2. After Sp2, Mid LAD lesion is 100% stenosed with 100% stenosed side branch in  2nd Diag.   Ostial-Proximal Ramus Stent is 100% stenosed.   Prox Cx to Mid Cx lesion is  95% stenosed (involving previously placed DES STENT) with 85% stenosed side branch in LPAV.   Previously placed 1st Mrg Ostial-proximal DES STENT is 99% stenosed.   Dist Cx l(after 1st Mrg) preciously placed DES STENT is 100% stenosed with 100% stenosed side branch in 2nd Mrg.   Ost RCA to Mid RCA lesion is 100% stenosed.   --------------4/4 PATENT GRAFTS-----------------------------   SVG graft was visualized by angiography and is normal in caliber.  The graft exhibits no disease. The flow is not reversed.   LIMA graft was visualized by angiography and is normal in caliber. The graft exhibits no disease. The flow is not reversed.   Seq SVG- Ramus-3rdMrg graft was visualized by angiography and is normal in caliber. The graft exhibits no disease. The flow is not reversed.   -----------------HEMODYMAMICS-------------------------   Hemodynamic findings consistent with mild pulmonary hypertension. LV end diastolic pressure is normal.   There is severe aortic valve stenosis - > mean gradient estimated 46 mmHg.  Prominent V wave on PCWP waveform.   POST-CATH DIAGNOSES Severe native vessel CAD: 85% proximal to mid LAD after her SP 1 and D1, then 100 and occluded after SP2 99% proximal to mid LCx prior to stent without occlusion of the distal LCx after OM1 and 99% OM1 100% CTO of RI stent 100% proximal RCA occlusion with extensive small caliber left-to-right collaterals filling the distal branch. 4 of 4 grafts patent: Free LIMA-LAD (from SVG-D2 hood), SVG-D2, SeqSVG-RI-OM 2 Difficult to fully assess valve gradient as the patient went into a paced rhythm with the pullback across the aortic valve estimated MEAN GRADIENT 46 MMHG-SEVERE AORTIC STENOSIS Right Heart Pressures indicate Mild to Moderate Pulmonary Hypertension: PAP-mean 4615-27 mmHg; PCWP mean of 15 with a V wave up to 28 mmHg. LVEDP 14 mmHg.; Cardiac Output-Index: (Fick) 7.0-3.19; (Thermodilution) 4.40-347 Successful Mynx closure of the  RFA   RECOMMENDATIONS Discharge home from short stay with plans to continue follow-up with heart valve clinic to complete TAVR workup. Small caliber RCA is 100% occluded-not a PCI target    Bryan Lemma, MD  Findings Coronary Findings Diagnostic  Dominance: Co-dominant  Left Main Vessel was injected. Vessel is normal in caliber.  Left Anterior Descending Vessel is moderate in size. Prox LAD to Mid LAD lesion is 85% stenosed. Mid LAD lesion is 100% stenosed with 100% stenosed side branch in 2nd Diag.  First Diagonal Branch Vessel is small in size.  Second Diagonal Branch Vessel is moderate in size.  Third Diagonal Branch Vessel is small in size.  Ramus Intermedius Ramus lesion is 100% stenosed. The lesion is chronically occluded. The lesion was previously treated using a drug eluting stent over 2 years ago. Previously placed stent displays restenosis.  Left Circumflex Vessel is normal in caliber. Prox Cx to Mid Cx lesion is 95% stenosed with 85% stenosed side branch in LPAV. The lesion was previously treated using a drug eluting stent over 2 years ago. Proximal edge of previous stent Previously placed stent displays restenosis. Dist Cx lesion is 100% stenosed with 100% stenosed side branch in 2nd Mrg. The lesion is chronically occluded. The lesion was previously treated using a drug eluting stent over 2 years ago. Previously placed stent displays restenosis.  First Obtuse Marginal Branch Vessel is small in size. 1st Mrg lesion is 99% stenosed. The lesion was previously treated using a drug eluting stent over 2  years ago. Previously placed stent displays restenosis.  First Left Posterolateral Branch Vessel is small in size.  Second Left Posterolateral Branch Vessel is small in size.  Right Coronary Artery Vessel was injected. Vessel is small. There is severe diffuse disease throughout the vessel. Ost RCA to Mid RCA lesion is 100% stenosed. The lesion is chronically  occluded.  Right Posterior Descending Artery Vessel is small in size. There is severe disease in the vessel. Collaterals RPDA filled by collaterals from 1st Sept.  Collaterals RPDA filled by collaterals from 2nd Sept.  First Right Posterolateral Branch There is moderate disease in the vessel.  Saphenous Graft To 2nd Diag SVG graft was visualized by angiography and is normal in caliber.  The graft exhibits no disease. The flow is not reversed. SVG-D2  LIMA Y-graft Branch To Mid LAD LIMA graft was visualized by angiography and is normal in caliber.  The flow is not reversed. LIMA-LAD  Sequential Jump Graft Graft To Ramus, 2nd Mrg Seq SVG- Ramus-3rdMrg graft was visualized by angiography and is normal in caliber.  The graft exhibits no disease. The flow is not reversed.  Intervention  No interventions have been documented.   CARDIAC CATHETERIZATION  CARDIAC CATHETERIZATION 11/30/2018  Narrative  Ramus lesion is 99% stenosed.  Prox LAD to Mid LAD lesion is 70% stenosed.  Dist Cx lesion is 60% stenosed.  Mid RCA lesion is 95% stenosed.  3rd Mrg-1 lesion is 80% stenosed.  3rd Mrg-2 lesion is 99% stenosed.  1st LPL lesion is 99% stenosed.  1. Severe multi-vessel CAD 2. Moderately severe mid LAD stenosis 3. Severe restenosis of the stented segment in the proximal segment of the ramus intermediate branch 4. Severe restenosis mid Circumflex/obtuse marginal stented segment. Severe stenosis in the first obtuse marginal branch beyond the stented segment. Severe stenosis distal Circumflex leading into the moderate caliber second obtuse marginal branch. 5. Severe stenosis mid RCA  Recommendations: Pt is a diabetic with mult-vessel CAD. Will ask CT surgery to see him to discuss potential bypass surgery. If he is not felt to be a candidate for bypass, would consider multi-vessel PCI. Admit to telemetry. Will hydrate tonight. BMET in am.  Findings Coronary Findings Diagnostic   Dominance: Co-dominant  Left Anterior Descending Prox LAD to Mid LAD lesion is 70% stenosed.  Ramus Intermedius Ramus lesion is 99% stenosed. The lesion was previously treated.  Left Circumflex Vessel is large. Dist Cx lesion is 60% stenosed. The lesion was previously treated.  Third Obtuse Marginal Branch 3rd Mrg-1 lesion is 80% stenosed. The lesion was previously treated. 3rd Mrg-2 lesion is 99% stenosed.  First Left Posterolateral Branch 1st LPL lesion is 99% stenosed.  Right Coronary Artery Vessel is moderate in size. Mid RCA lesion is 95% stenosed.  Intervention  No interventions have been documented.   STRESS TESTS  NM PET CT CARDIAC PERFUSION MULTI W/ABSOLUTE BLOODFLOW 11/18/2021  Narrative   Findings are consistent with prior myocardial infarction. There is no evidence of post infarct ischemia. The study is high risk.   Myocardial blood flow was computed to be 0.69ml/g/min at rest and 0.7ml/g/min at stress. Global myocardial blood flow reserve was 0.98 and was highly abnormal.  Though sensitivity of MBRF is less reliable in the setting of CABG, stress flows less that rest flows can be seen in patients non responsive to Lexiscan or can be seen in highly abnormal perfusion.   Thare are small basal inferior and basal anterior perfusion defects in rest and stress with hypokinesis consistent  with infarct.   Rest left ventricular function is abnormal. Rest global function is mildly reduced. Rest EF: 46 %. Stress left ventricular function is abnormal. Stress global function is mildly reduced. Stress EF: 40 %. End diastolic cavity size is normal. End systolic cavity size is normal. LVEF Reserve -6% is a negative prognostic factor.   Coronary calcium was present on the attenuation correction CT images. Severe coronary calcifications were present. Coronary calcifications were present in the left anterior descending artery, left circumflex artery and right coronary artery  distribution(s).   Electronically Signed By: Riley Lam MD FASE  CLINICAL DATA:  This over-read does not include interpretation of cardiac or coronary anatomy or pathology. The Cardiac PET CT interpretation by the cardiologist is attached.  COMPARISON:  Chest CT December 01, 2018 and chest radiograph October 24, 2021.  FINDINGS: Vascular: Aortic atherosclerosis. Prior median sternotomy and CABG. Partially visualized intracardiac leads.  Mediastinum/Nodes: Within the visualized portions of the chest there are no pathologically enlarged mediastinal or hilar lymph nodes within the limitation of noncontrast enhanced examination. The mid/distal esophagus is grossly unremarkable.  Lungs/Pleura: Scattered pulmonary nodules for instance in the right lower lobe measuring 8 mm on image 5/4 and in the right middle lobe measuring 6 mm on image 45/4. Chronic left pleural thickening with a rounded consolidative focus in the lingula adjacent to the fissure measuring 19 mm on image 39/4 corresponding with the region of atelectasis seen on prior examination likely reflecting round atelectasis. Dendritic type calcifications in the left lung base.  Upper Abdomen: No acute abnormality.  Musculoskeletal: Bilateral gynecomastia. Multilevel degenerative changes spine.  IMPRESSION: 1. Scattered pulmonary nodules measuring up to 8 mm in the right lower lobe. Suggest follow-up dedicated chest CT in 3-6 months. 2. Chronic left pleural thickening with a rounded consolidative focus in the lingula adjacent to the fissure measuring 19 mm corresponding with the region of atelectasis seen on prior examination likely reflecting round atelectasis. Suggest attention on follow-up chest CT 3.  Aortic Atherosclerosis (ICD10-I70.0).   Electronically Signed By: Maudry Mayhew M.D. On: 11/18/2021 17:45   ECHOCARDIOGRAM  ECHOCARDIOGRAM COMPLETE 10/02/2022  Narrative ECHOCARDIOGRAM  REPORT    Patient Name:   Bradley Soto Date of Exam: 10/02/2022 Medical Rec #:  [id number]      Height:       71.0 in Accession #:    [id number]     Weight:       220.8 lb Date of Birth:  [birth year]       BSA:          2.199 m Patient Age:    81 years       BP:           148/76 mmHg Patient Gender: M              HR:           82 bpm. Exam Location:  [address]  Procedure: 2D Echo, 3D Echo, Cardiac Doppler and Color Doppler  Indications:    Z95.2 Status Post TAVR  History:        Patient has prior history of Echocardiogram examinations, most recent 08/26/2022. CAD and Previous Myocardial Infarction, Prior CABG and Pacemaker, Aortic Valve Disease, Arrythmias:Atrial Fibrillation; Risk Factors:Family History of Coronary Artery Disease, Hypertension, Diabetes, Dyslipidemia, Former Smoker and Sleep Apnea. Status Post TAVR (08-25-22, 26mm Edwards S3UR), Sick Sinus Syndrome, End Stage Renal Disease.  Sonographer:    Farrel Conners RDCS Referring Phys: Janetta Hora  IMPRESSIONS  1. Left ventricular ejection fraction, by estimation, is 50%. The left ventricle has mildly decreased function. The left ventricle demonstrates regional wall motion abnormalities with inferior hypokinesis and septal-lateral dyssynchrony consistent with RV pacing. There is mild concentric left ventricular hypertrophy. Left ventricular diastolic parameters are consistent with Grade I diastolic dysfunction (impaired relaxation). 2. Right ventricular systolic function is mildly reduced. The right ventricular size is normal. Tricuspid regurgitation signal is inadequate for assessing PA pressure. 3. Left atrial size was mildly dilated. 4. The mitral valve is degenerative. No evidence of mitral valve regurgitation. Mild mitral stenosis. The mean mitral valve gradient is 4.0 mmHg. Moderate mitral annular calcification. 5. Bioprosthetic aortic valve s/p TAVR with 26 mm Edwards Sapien 3 THV. Mean gradient 11 mmHg  with EOA 1.21 cm^2, dimensionless index 0.32. No significant peri-valvular leakage. 6. The inferior vena cava is normal in size with greater than 50% respiratory variability, suggesting right atrial pressure of 3 mmHg.  FINDINGS Left Ventricle: Left ventricular ejection fraction, by estimation, is 50%. The left ventricle has mildly decreased function. The left ventricle demonstrates regional wall motion abnormalities. The left ventricular internal cavity size was normal in size. There is mild concentric left ventricular hypertrophy. Left ventricular diastolic parameters are consistent with Grade I diastolic dysfunction (impaired relaxation).  Right Ventricle: The right ventricular size is normal. No increase in right ventricular wall thickness. Right ventricular systolic function is mildly reduced. Tricuspid regurgitation signal is inadequate for assessing PA pressure.  Left Atrium: Left atrial size was mildly dilated.  Right Atrium: Right atrial size was normal in size.  Pericardium: There is no evidence of pericardial effusion.  Mitral Valve: The mitral valve is degenerative in appearance. There is mild calcification of the mitral valve leaflet(s). Moderate mitral annular calcification. No evidence of mitral valve regurgitation. Mild mitral valve stenosis. The mean mitral valve gradient is 4.0 mmHg.  Tricuspid Valve: The tricuspid valve is normal in structure. Tricuspid valve regurgitation is trivial.  Aortic Valve: Bioprosthetic aortic valve s/p TAVR with 26 mm Edwards Sapien 3 THV. Mean gradient 11 mmHg with EOA 1.21 cm^2, dimensionless index 0.32. No significant peri-valvular leakage. The aortic valve has been repaired/replaced. Aortic valve regurgitation is not visualized. Aortic valve mean gradient measures 11.0 mmHg. Aortic valve peak gradient measures 18.2 mmHg. Aortic valve area, by VTI measures 1.34 cm.  Pulmonic Valve: The pulmonic valve was normal in structure. Pulmonic valve  regurgitation is trivial.  Aorta: The aortic root is normal in size and structure.  Venous: The inferior vena cava is normal in size with greater than 50% respiratory variability, suggesting right atrial pressure of 3 mmHg.  IAS/Shunts: No atrial level shunt detected by color flow Doppler.  Additional Comments: A device lead is visualized in the right ventricle.   LEFT VENTRICLE PLAX 2D LVIDd:         4.90 cm   Diastology LVIDs:         3.55 cm   LV e' medial:    4.68 cm/s LV PW:         1.20 cm   LV E/e' medial:  23.3 LV IVS:        1.15 cm   LV e' lateral:   6.74 cm/s LVOT diam:     2.20 cm   LV E/e' lateral: 16.2 LV SV:         58 LV SV Index:   26 LVOT Area:     3.80 cm  3D Volume EF: 3D EF:  36 % LV EDV:       197 ml LV ESV:       125 ml LV SV:        71 ml  RIGHT VENTRICLE RV Basal diam:  3.60 cm RV S prime:     10.40 cm/s TAPSE (M-mode): 1.7 cm  LEFT ATRIUM             Index        RIGHT ATRIUM           Index LA diam:        4.60 cm 2.09 cm/m   RA Pressure: 3.00 mmHg LA Vol (A2C):   67.0 ml 30.47 ml/m  RA Area:     17.30 cm LA Vol (A4C):   80.9 ml 36.79 ml/m  RA Volume:   46.50 ml  21.14 ml/m LA Biplane Vol: 74.1 ml 33.70 ml/m AORTIC VALVE AV Area (Vmax):    1.29 cm AV Area (Vmean):   1.30 cm AV Area (VTI):     1.34 cm AV Vmax:           213.50 cm/s AV Vmean:          143.750 cm/s AV VTI:            0.433 m AV Peak Grad:      18.2 mmHg AV Mean Grad:      11.0 mmHg LVOT Vmax:         72.38 cm/s LVOT Vmean:        49.050 cm/s LVOT VTI:          0.152 m LVOT/AV VTI ratio: 0.35  AORTA Ao Root diam: 2.80 cm Ao Asc diam:  3.70 cm  MITRAL VALVE                TRICUSPID VALVE MV Area (PHT): cm          Estimated RAP:  3.00 mmHg MV Mean grad:  4.0 mmHg MV Decel Time: 210 msec     SHUNTS MV E velocity: 109.00 cm/s  Systemic VTI:  0.15 m MV A velocity: 123.00 cm/s  Systemic Diam: 2.20 cm MV E/A ratio:  0.89  Dalton McleanMD Electronically  signed by Wilfred Lacy Signature Date/Time: 10/02/2022/1:03:32 PM    Final   TEE  ECHO INTRAOPERATIVE TEE 12/05/2018  Narrative *INTRAOPERATIVE TRANSESOPHAGEAL REPORT *    Patient Name:   YANICK SYLTE Date of Exam: 12/05/2018 Medical Rec #:  [id number]      Height:       71.5 in Accession #:    [id number]     Weight:       235.9 lb Date of Birth:  25-Sep-1940       BSA:          2.27 m Patient Age:    78 years       BP:           131/64 mmHg Patient Gender: M              HR:           65 bpm. Exam Location:  Anesthesiology  Transesophogeal exam was perform intraoperatively during surgical procedure. Patient was closely monitored under general anesthesia during the entirety of examination.  Indications:     I25.700 Atherosclerosis of coronary artery bypass graft(s), unspecified, with unstable angina pectoris History:         CAD and Previous Myocardial Infarction, Abnormal ECG and Pacemaker, Aortic Valve Disease; Arrythmias:Atrial  Fibrillation Signs/Symptoms:Chest Pain Risk Factors:Hypertension, Diabetes, Sleep Apnea and Dyslipidemia. Performing Phys: [id number] Merri Brunette ATKINS Diagnosing Phys: Leslye Peer MD  Complications: No known complications during this procedure. POST-OP IMPRESSIONS - Left Ventricle: The left ventricle is unchanged from pre-bypass. - Right Ventricle: The right ventricle appears unchanged from pre-bypass. - Aorta: The aorta appears unchanged from pre-bypass. - Left Atrium: The left atrium appears unchanged from pre-bypass. - Left Atrial Appendage: The left atrial appendage appears unchanged from pre-bypass. - Aortic Valve: The aortic valve appears unchanged from pre-bypass. - Mitral Valve: The mitral valve appears unchanged from pre-bypass. - Tricuspid Valve: The tricuspid valve appears unchanged from pre-bypass. - Interatrial Septum: The interatrial septum appears unchanged from pre-bypass. - Interventricular Septum: The interventricular septum  appears unchanged from pre-bypass. - Pericardium: The pericardium appears unchanged from pre-bypass.  PRE-OP FINDINGS Left Ventricle: The left ventricle has normal systolic function, with an ejection fraction of 55-60%. The cavity size was normal. There is mild upper septal hypertrophy.  Right Ventricle: The right ventricle has normal systolic function. The cavity was normal. There is no increase in right ventricular wall thickness.  Left Atrium: Left atrial size was dilated. There is continuous echo contrast seen in the left atrial cavity. The left atrial appendage is well visualized and there is no evidence of thrombus present.  Right Atrium: Right atrial size was normal in size. Right atrial pressure is estimated at 10 mmHg.  Interatrial Septum: The interatrial septum was not well visualized.  Pericardium: There is no evidence of pericardial effusion.  Mitral Valve: The mitral valve is normal in structure. Mitral valve regurgitation is not visualized by color flow Doppler.  Tricuspid Valve: The tricuspid valve was not well visualized. Tricuspid valve regurgitation was not visualized by color flow Doppler.  Aortic Valve: The aortic valve is tricuspid Aortic valve regurgitation was not visualized by color flow Doppler. There is no evidence of aortic valve stenosis.  Pulmonic Valve: The pulmonic valve was not well visualized. Pulmonic valve regurgitation was not assessed by color flow Doppler.   Aorta: The aortic root, ascending aorta and aortic arch are normal in size and structure. There is evidence of immobile plaque in the descending aorta.   Leslye Peer MD Electronically signed by Leslye Peer MD Signature Date/Time: 12/05/2018/12:44:24 PM    Final    CT SCANS  CT CORONARY MORPH W/CTA COR W/SCORE 08/14/2022  Addendum 08/14/2022  1:13 PM ADDENDUM REPORT: 08/14/2022 13:11  FINDINGS: Extracardiac findings will be described separately under dictation for  contemporaneously obtained CTA chest, abdomen and pelvis.  IMPRESSION: Please see separate dictation for contemporaneously obtained CTA chest, abdomen and pelvis dated 08/14/2022 for full description of relevant extracardiac findings.   Electronically Signed By: Allegra Lai M.D. On: 08/14/2022 13:11  Narrative CLINICAL DATA:  Severe Aortic Stenosis.  EXAM: Cardiac TAVR CT  TECHNIQUE: A non-contrast, gated CT scan was obtained with axial slices of 3 mm through the heart for aortic valve calcium scoring. A 120 kV retrospective, gated, contrast cardiac scan was obtained. Gantry rotation speed was 250 msecs and collimation was 0.6 mm. Nitroglycerin was not given. The 3D data set was reconstructed in 5% intervals of the 0-95% of the R-R cycle. Systolic and diastolic phases were analyzed on a dedicated workstation using MPR, MIP, and VRT modes. The patient received 100 cc of contrast.  FINDINGS: Image quality: Excellent.  Noise artifact is: Limited.  Valve Morphology: Tricuspid aortic valve with diffuse severe calcifications. Bulky calcifications of the NCC/LCC.  Aortic Valve  Calcium score: 2947  Aortic annular dimension:  Phase assessed: 35%  Annular area: 491 mm2  Annular perimeter: 80.7 mm  Max diameter: 28.6 mm  Min diameter: 22.5 mm  Annular and subannular calcification: No annular calcium. Mild subannular calcium under the LCC.  Membranous septum length: 9.6 mm  Optimal coplanar projection: LAO 10 CAU 12  Coronary Artery Height above Annulus:  Left Main: 9.8 mm  Right Coronary: 17.1 mm  Sinus of Valsalva Measurements:  Non-coronary: 34 mm  Right-coronary: 34 mm  Left-coronary: 35 mm  Sinus of Valsalva Height:  Non-coronary: 25.0 mm  Right-coronary: 21.7 mm  Left-coronary: 20.8 mm  Sinotubular Junction: 30 mm  Ascending Thoracic Aorta: 34 mm  Coronary Arteries: Normal coronary origin. Left dominance. The study was performed  without use of NTG and is insufficient for plaque evaluation. Please refer to recent cardiac catheterization for coronary assessment. S/p CABG.  Cardiac Morphology:  Right Atrium: Right atrial size is within normal limits. CIED leads noted in the RA/RV.  Right Ventricle: The right ventricular cavity is dilated.  Left Atrium: Left atrial size is normal in size. The LAA has been closed with a surgical clip.  Left Ventricle: The ventricular cavity size is within normal limits. Evidence of basal to mid inferolateral infarction.  Pulmonary arteries: Dilated pulmonary artery suggestive of pulmonary hypertension.  Pulmonary veins: Normal pulmonary venous drainage.  Pericardium: Normal thickness with no significant effusion or calcium present.  Mitral Valve: The mitral valve is normal structure without significant calcification.  Extra-cardiac findings: See attached radiology report for non-cardiac structures.  IMPRESSION: 1. Annular measurements support a 26 mm S3 or 29 mm Evolut Pro.  2. No significant annular calcifications. Mild subannular calcium under the LCC.  3. Shallow coronary height for the left main, but large sinuses noted.  4. Optimal Fluoroscopic Angle for Delivery: LAO 10 CAU 12  5. S/p CABG. Evidence of basal to mid inferolateral infarction.  6. Dilated pulmonary artery suggestive of pulmonary hypertension.  Gerri Spore T. Flora Lipps, MD  Electronically Signed: By: Lennie Odor M.D. On: 08/14/2022 12:56          EKG:  EKG is NOT ordered today.    Recent Labs: 04/17/2022: B Natriuretic Peptide 1,182.0 08/21/2022: ALT 34 08/26/2022: BUN 45; Creatinine, Ser 6.49; Hemoglobin 8.1; Magnesium 2.2; Platelets 114; Potassium 5.1; Sodium 138  Recent Lipid Panel    Component Value Date/Time   CHOL 85 12/02/2018 0333   TRIG 145 12/02/2018 0333   HDL 25 (L) 12/02/2018 0333   CHOLHDL 3.4 12/02/2018 0333   VLDL 29 12/02/2018 0333   LDLCALC 31 12/02/2018 0333    LDLDIRECT 50.5 10/10/2013 1017     Risk Assessment/Calculations:       Physical Exam:    VS:  BP (!) 148/68   Pulse 80   Ht 5\' 11"  (1.803 m)   Wt 216 lb 9.6 oz (98.2 kg)   SpO2 96%   BMI 30.21 kg/m     Wt Readings from Last 3 Encounters:  10/02/22 216 lb 9.6 oz (98.2 kg)  09/25/22 220 lb 12.8 oz (100.2 kg)  09/02/22 218 lb 3.2 oz (99 kg)     GEN:  Well nourished, well developed in no acute distress HEENT: Normal NECK: No JVD LYMPHATICS: No lymphadenopathy CARDIAC: RRR, no murmurs, rubs, gallops RESPIRATORY:  Clear to auscultation without rales, wheezing or rhonchi  ABDOMEN: Soft, non-tender, non-distended MUSCULOSKELETAL:  No edema; No deformity  SKIN: Warm and dry.   NEUROLOGIC:  Alert and  oriented x 3 PSYCHIATRIC:  Normal affect   ASSESSMENT:    1. S/P TAVR (transcatheter aortic valve replacement)   2. Hypertension, unspecified type   3. ESRD (end stage renal disease) (HCC)   4. Coronary artery disease involving native coronary artery of native heart without angina pectoris   5. Presence of permanent cardiac pacemaker   6. Solitary pulmonary nodule     PLAN:    In order of problems listed above:  Severe AS s/p TAVR: echo today showed EF 50%, mild LVH, inferior hypokinesis and septal-lateral dyssynchrony consistent with RV pacing, normally functioning TAVR with a mean gradient of 11 mmHg and no PVL as well as moderate MAC with mild MS. He has NYHA class I symptoms. Continue on Asprin 81mg  daily. SBE prophylaxis discussed; I have RX'd amoxicillin. I will see him back in 1 year for follow up and echo.    HTN: BP mildly elevated today but runs low at HD so will not adjust medications.    ESRD on HD: on T, Th, Sat.     CAD: cardiac catheterization 07/31/22 showed severe native three-vessel coronary disease with 4/4 patent grafts. Continue medical therapy.    SSS s/p PPM: follows with Dr. Nelly Laurence.   Pulmonary nodule: pre TAVR CT showed "part solid nodule of the  left upper lobe is stable when compared with most recent prior exam, however small solid component is new when compared with 12/01/2018 prior. 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 Follow-up chest CT is recommended in 6 months." Repeat CT scan done early (accidentally) but recommended another scan in 3 months. Scheduled for 12/16/22  Medication Adjustments/Labs and Tests Ordered: Current medicines are reviewed at length with the patient today.  Concerns regarding medicines are outlined above.  Orders Placed This Encounter  Procedures   ECHOCARDIOGRAM COMPLETE   No orders of the defined types were placed in this encounter.   Patient Instructions  Medication Instructions:  Your physician recommends that you continue on your current medications as directed. Please refer to the Current Medication list given to you today.  *If you need a refill on your cardiac medications before your next appointment, please call your pharmacy*   Lab Work: None ordered   If you have labs (blood work) drawn today and your tests are completely normal, you will receive your results only by: MyChart Message (if you have MyChart) OR A paper copy in the mail If you have any lab test that is abnormal or we need to change your treatment, we will call you to review the results.   Testing/Procedures: Follow up echocardiogram as scheduled    Follow-Up: Follow up as scheduled   Other Instructions     Signed, Cline Crock, PA-C  10/02/2022 1:26 PM    [facility]

## 2022-10-03 DIAGNOSIS — D631 Anemia in chronic kidney disease: Secondary | ICD-10-CM | POA: Diagnosis not present

## 2022-10-03 DIAGNOSIS — E1122 Type 2 diabetes mellitus with diabetic chronic kidney disease: Secondary | ICD-10-CM | POA: Diagnosis not present

## 2022-10-03 DIAGNOSIS — D509 Iron deficiency anemia, unspecified: Secondary | ICD-10-CM | POA: Diagnosis not present

## 2022-10-03 DIAGNOSIS — N2581 Secondary hyperparathyroidism of renal origin: Secondary | ICD-10-CM | POA: Diagnosis not present

## 2022-10-03 DIAGNOSIS — Z992 Dependence on renal dialysis: Secondary | ICD-10-CM | POA: Diagnosis not present

## 2022-10-03 DIAGNOSIS — N186 End stage renal disease: Secondary | ICD-10-CM | POA: Diagnosis not present

## 2022-10-06 DIAGNOSIS — Z992 Dependence on renal dialysis: Secondary | ICD-10-CM | POA: Diagnosis not present

## 2022-10-06 DIAGNOSIS — E1122 Type 2 diabetes mellitus with diabetic chronic kidney disease: Secondary | ICD-10-CM | POA: Diagnosis not present

## 2022-10-06 DIAGNOSIS — D509 Iron deficiency anemia, unspecified: Secondary | ICD-10-CM | POA: Diagnosis not present

## 2022-10-06 DIAGNOSIS — D631 Anemia in chronic kidney disease: Secondary | ICD-10-CM | POA: Diagnosis not present

## 2022-10-06 DIAGNOSIS — N2581 Secondary hyperparathyroidism of renal origin: Secondary | ICD-10-CM | POA: Diagnosis not present

## 2022-10-06 DIAGNOSIS — N186 End stage renal disease: Secondary | ICD-10-CM | POA: Diagnosis not present

## 2022-10-10 DIAGNOSIS — Z992 Dependence on renal dialysis: Secondary | ICD-10-CM | POA: Diagnosis not present

## 2022-10-10 DIAGNOSIS — E1122 Type 2 diabetes mellitus with diabetic chronic kidney disease: Secondary | ICD-10-CM | POA: Diagnosis not present

## 2022-10-10 DIAGNOSIS — N2581 Secondary hyperparathyroidism of renal origin: Secondary | ICD-10-CM | POA: Diagnosis not present

## 2022-10-10 DIAGNOSIS — D509 Iron deficiency anemia, unspecified: Secondary | ICD-10-CM | POA: Diagnosis not present

## 2022-10-10 DIAGNOSIS — D631 Anemia in chronic kidney disease: Secondary | ICD-10-CM | POA: Diagnosis not present

## 2022-10-10 DIAGNOSIS — N186 End stage renal disease: Secondary | ICD-10-CM | POA: Diagnosis not present

## 2022-10-13 DIAGNOSIS — E039 Hypothyroidism, unspecified: Secondary | ICD-10-CM | POA: Diagnosis not present

## 2022-10-13 DIAGNOSIS — D509 Iron deficiency anemia, unspecified: Secondary | ICD-10-CM | POA: Diagnosis not present

## 2022-10-13 DIAGNOSIS — Z992 Dependence on renal dialysis: Secondary | ICD-10-CM | POA: Diagnosis not present

## 2022-10-13 DIAGNOSIS — E1122 Type 2 diabetes mellitus with diabetic chronic kidney disease: Secondary | ICD-10-CM | POA: Diagnosis not present

## 2022-10-13 DIAGNOSIS — N2581 Secondary hyperparathyroidism of renal origin: Secondary | ICD-10-CM | POA: Diagnosis not present

## 2022-10-13 DIAGNOSIS — N186 End stage renal disease: Secondary | ICD-10-CM | POA: Diagnosis not present

## 2022-10-13 DIAGNOSIS — D631 Anemia in chronic kidney disease: Secondary | ICD-10-CM | POA: Diagnosis not present

## 2022-10-15 DIAGNOSIS — N186 End stage renal disease: Secondary | ICD-10-CM | POA: Diagnosis not present

## 2022-10-15 DIAGNOSIS — Z992 Dependence on renal dialysis: Secondary | ICD-10-CM | POA: Diagnosis not present

## 2022-10-15 DIAGNOSIS — E1122 Type 2 diabetes mellitus with diabetic chronic kidney disease: Secondary | ICD-10-CM | POA: Diagnosis not present

## 2022-10-15 DIAGNOSIS — N2581 Secondary hyperparathyroidism of renal origin: Secondary | ICD-10-CM | POA: Diagnosis not present

## 2022-10-15 DIAGNOSIS — D631 Anemia in chronic kidney disease: Secondary | ICD-10-CM | POA: Diagnosis not present

## 2022-10-15 DIAGNOSIS — D509 Iron deficiency anemia, unspecified: Secondary | ICD-10-CM | POA: Diagnosis not present

## 2022-10-17 DIAGNOSIS — Z992 Dependence on renal dialysis: Secondary | ICD-10-CM | POA: Diagnosis not present

## 2022-10-17 DIAGNOSIS — D509 Iron deficiency anemia, unspecified: Secondary | ICD-10-CM | POA: Diagnosis not present

## 2022-10-17 DIAGNOSIS — N186 End stage renal disease: Secondary | ICD-10-CM | POA: Diagnosis not present

## 2022-10-17 DIAGNOSIS — D631 Anemia in chronic kidney disease: Secondary | ICD-10-CM | POA: Diagnosis not present

## 2022-10-17 DIAGNOSIS — E1122 Type 2 diabetes mellitus with diabetic chronic kidney disease: Secondary | ICD-10-CM | POA: Diagnosis not present

## 2022-10-17 DIAGNOSIS — N2581 Secondary hyperparathyroidism of renal origin: Secondary | ICD-10-CM | POA: Diagnosis not present

## 2022-10-18 DIAGNOSIS — I129 Hypertensive chronic kidney disease with stage 1 through stage 4 chronic kidney disease, or unspecified chronic kidney disease: Secondary | ICD-10-CM | POA: Diagnosis not present

## 2022-10-18 DIAGNOSIS — Z992 Dependence on renal dialysis: Secondary | ICD-10-CM | POA: Diagnosis not present

## 2022-10-18 DIAGNOSIS — N186 End stage renal disease: Secondary | ICD-10-CM | POA: Diagnosis not present

## 2022-10-20 DIAGNOSIS — E1122 Type 2 diabetes mellitus with diabetic chronic kidney disease: Secondary | ICD-10-CM | POA: Diagnosis not present

## 2022-10-20 DIAGNOSIS — D631 Anemia in chronic kidney disease: Secondary | ICD-10-CM | POA: Diagnosis not present

## 2022-10-20 DIAGNOSIS — Z992 Dependence on renal dialysis: Secondary | ICD-10-CM | POA: Diagnosis not present

## 2022-10-20 DIAGNOSIS — N2581 Secondary hyperparathyroidism of renal origin: Secondary | ICD-10-CM | POA: Diagnosis not present

## 2022-10-20 DIAGNOSIS — D509 Iron deficiency anemia, unspecified: Secondary | ICD-10-CM | POA: Diagnosis not present

## 2022-10-20 DIAGNOSIS — N186 End stage renal disease: Secondary | ICD-10-CM | POA: Diagnosis not present

## 2022-10-22 DIAGNOSIS — D509 Iron deficiency anemia, unspecified: Secondary | ICD-10-CM | POA: Diagnosis not present

## 2022-10-22 DIAGNOSIS — N186 End stage renal disease: Secondary | ICD-10-CM | POA: Diagnosis not present

## 2022-10-22 DIAGNOSIS — D631 Anemia in chronic kidney disease: Secondary | ICD-10-CM | POA: Diagnosis not present

## 2022-10-22 DIAGNOSIS — Z992 Dependence on renal dialysis: Secondary | ICD-10-CM | POA: Diagnosis not present

## 2022-10-22 DIAGNOSIS — E1122 Type 2 diabetes mellitus with diabetic chronic kidney disease: Secondary | ICD-10-CM | POA: Diagnosis not present

## 2022-10-22 DIAGNOSIS — N2581 Secondary hyperparathyroidism of renal origin: Secondary | ICD-10-CM | POA: Diagnosis not present

## 2022-10-23 ENCOUNTER — Other Ambulatory Visit: Payer: Self-pay

## 2022-10-23 ENCOUNTER — Encounter (HOSPITAL_COMMUNITY): Payer: Self-pay

## 2022-10-23 ENCOUNTER — Emergency Department (HOSPITAL_COMMUNITY)
Admission: EM | Admit: 2022-10-23 | Discharge: 2022-10-23 | Discharge status: Against Medical Advice | Payer: Medicare Other | Attending: Emergency Medicine | Admitting: Emergency Medicine

## 2022-10-23 ENCOUNTER — Emergency Department (HOSPITAL_COMMUNITY): Payer: Medicare Other

## 2022-10-23 DIAGNOSIS — R0789 Other chest pain: Secondary | ICD-10-CM | POA: Diagnosis not present

## 2022-10-23 DIAGNOSIS — I2510 Atherosclerotic heart disease of native coronary artery without angina pectoris: Secondary | ICD-10-CM | POA: Diagnosis not present

## 2022-10-23 DIAGNOSIS — I120 Hypertensive chronic kidney disease with stage 5 chronic kidney disease or end stage renal disease: Secondary | ICD-10-CM | POA: Diagnosis not present

## 2022-10-23 DIAGNOSIS — R079 Chest pain, unspecified: Secondary | ICD-10-CM | POA: Diagnosis not present

## 2022-10-23 DIAGNOSIS — E875 Hyperkalemia: Secondary | ICD-10-CM | POA: Diagnosis not present

## 2022-10-23 DIAGNOSIS — I159 Secondary hypertension, unspecified: Secondary | ICD-10-CM | POA: Diagnosis not present

## 2022-10-23 DIAGNOSIS — Z7982 Long term (current) use of aspirin: Secondary | ICD-10-CM | POA: Insufficient documentation

## 2022-10-23 DIAGNOSIS — Z992 Dependence on renal dialysis: Secondary | ICD-10-CM | POA: Insufficient documentation

## 2022-10-23 DIAGNOSIS — N186 End stage renal disease: Secondary | ICD-10-CM | POA: Diagnosis not present

## 2022-10-23 DIAGNOSIS — Z951 Presence of aortocoronary bypass graft: Secondary | ICD-10-CM | POA: Insufficient documentation

## 2022-10-23 DIAGNOSIS — Z950 Presence of cardiac pacemaker: Secondary | ICD-10-CM | POA: Diagnosis not present

## 2022-10-23 DIAGNOSIS — R918 Other nonspecific abnormal finding of lung field: Secondary | ICD-10-CM | POA: Diagnosis not present

## 2022-10-23 LAB — CBC
HCT: 33.6 % — ABNORMAL LOW (ref 39.0–52.0)
Hemoglobin: 10.4 g/dL — ABNORMAL LOW (ref 13.0–17.0)
MCH: 32.3 pg (ref 26.0–34.0)
MCHC: 31 g/dL (ref 30.0–36.0)
MCV: 104.3 fL — ABNORMAL HIGH (ref 80.0–100.0)
Platelets: 78 10*3/uL — ABNORMAL LOW (ref 150–400)
RBC: 3.22 MIL/uL — ABNORMAL LOW (ref 4.22–5.81)
RDW: 15.5 % (ref 11.5–15.5)
WBC: 6.8 10*3/uL (ref 4.0–10.5)
nRBC: 0 % (ref 0.0–0.2)

## 2022-10-23 LAB — BASIC METABOLIC PANEL
Anion gap: 15 (ref 5–15)
BUN: 58 mg/dL — ABNORMAL HIGH (ref 8–23)
CO2: 24 mmol/L (ref 22–32)
Calcium: 10.1 mg/dL (ref 8.9–10.3)
Chloride: 100 mmol/L (ref 98–111)
Creatinine, Ser: 6.86 mg/dL — ABNORMAL HIGH (ref 0.61–1.24)
GFR, Estimated: 7 mL/min — ABNORMAL LOW (ref >60)
Glucose, Bld: 203 mg/dL — ABNORMAL HIGH (ref 70–99)
Potassium: 5.3 mmol/L — ABNORMAL HIGH (ref 3.5–5.1)
Sodium: 139 mmol/L (ref 135–145)

## 2022-10-23 LAB — TROPONIN I (HIGH SENSITIVITY)
Troponin I (High Sensitivity): 54 ng/L — ABNORMAL HIGH (ref <18)
Troponin I (High Sensitivity): 62 ng/L — ABNORMAL HIGH (ref <18)

## 2022-10-23 MED ORDER — SODIUM ZIRCONIUM CYCLOSILICATE 10 G PO PACK
10.0000 g | PACK | ORAL | Status: AC
  Administered 2022-10-23: 10 g via ORAL
  Filled 2022-10-23: qty 1

## 2022-10-23 MED ORDER — ASPIRIN 81 MG PO CHEW: 324.0000 mg | CHEWABLE_TABLET | Freq: Once | ORAL | Status: DC

## 2022-10-23 MED ORDER — LABETALOL HCL 5 MG/ML IV SOLN: 10.0000 mg | Freq: Once | INTRAVENOUS | Status: DC

## 2022-10-23 NOTE — ED Notes (Signed)
Patient refused ASA because he was no longer having chest pain. Patient and wife refused labetalol because he has dialysis in the morning and his blood pressure is always low due to it. He also states he did not want an IV. Patient asked tech and RN to disconnect him as he wanted to go, he was getting tired and his wife cannot drive at night. The MD Dr. Eloise Harman was made aware. He was made aware of the risks of going AMA and encouraged to come back if symptoms worsen or persist. They state they understood and left. Updated vitals put in, other than the hypertension, vitals are stable. Patient left in wheel chair with wife.

## 2022-10-23 NOTE — ED Triage Notes (Signed)
Pt c/o right sided neck pain that radiates to midsternal chest area started today. Pt took two nitroglycerin today w/relief, but then the pain came back. Pt denies SOB, N/V.

## 2022-10-23 NOTE — Discharge Instructions (Signed)
You were seen for your chest pain in the emergency department.   At home, please avoid NSAIDs but take aspirin if you have severe chest pain. Take nitrogylcerin for your pain as well.    Follow-up with your primary doctor in 2-3 days regarding your visit.  Cardiology will be calling you regarding an appointment within the next 72 hours.  You may contact them if you do not hear from them in that time using the information in this packet.  Return immediately to the emergency department if you experience any of the following: Worsening pain, difficulty breathing, unexplained vomiting or sweating, or any other concerning symptoms.    Thank you for visiting our Emergency Department. It was a pleasure taking care of you today.

## 2022-10-23 NOTE — ED Provider Notes (Signed)
[place] EMERGENCY DEPARTMENT AT [facility] Provider Note   CSN: [id number] Arrival date & time: 10/23/22  1633     History {Add pertinent medical, surgical, social history, OB history to HPI:1} Chief Complaint  Patient presents with   Chest Pain    Bradley Soto is a 82 y.o. male.  82 year old male with a history of CAD status post PCI, CABG, ESRD on IHD, PAF not on AC, sick sinus syndrome, and diabetes who presents emergency department chest discomfort.  At approximately 430 today patient was watching television when he felt a discomfort in his lower right neck.  Says that it radiated down into his chest.  Took some nitroglycerin and it resolved.  Reports that it came back he took another nitroglycerin and it went away.  Took some ibuprofen prior to arrival but no aspirin.  Not on any other blood thinners.  Got dialysis yesterday.  Not on antihypertensives and reports his blood pressure is usually low.  Currently chest pain-free.       Home Medications Prior to Admission medications   Medication Sig Start Date End Date Taking? Authorizing Provider  amoxicillin (AMOXIL) 500 MG tablet Take 4 tablets (2,000 mg total) by mouth as directed. 1 hour prior to dental work including cleanings 09/02/22   Janetta Hora, PA-C  aspirin EC 81 MG tablet Take 81 mg by mouth in the morning.    [provider]  B Complex-C-Zn-Folic Acid (DIALYVITE 800 WITH ZINC) 0.8 MG TABS Take 1 tablet by mouth at bedtime. 11/08/19   [provider]  CALCITRIOL PO Take 1 Dose by mouth Every Tuesday,Thursday,and Saturday with dialysis.    [provider]  copper tablet Take 4 mg by mouth daily.    [provider]  [employer] VITAMIN B12 1000 MCG tablet TAKE 1 TABLET BY MOUTH EVERY DAY 03/26/22   Rojelio Brenner M, PA-C  diclofenac Sodium (VOLTAREN) 1 % GEL Apply 1 Application topically 4 (four) times daily as needed (neck pain).    [provider]  diltiazem  (CARDIZEM SR) 60 MG 12 hr capsule Take 1 capsule (60 mg total) by mouth as needed (palpitations). 05/22/22   Sharlene Dory, PA-C  gabapentin (NEURONTIN) 100 MG capsule Take 1 capsule (100 mg total) by mouth 3 (three) times daily. Patient taking differently: Take 200 mg by mouth at bedtime. 09/24/21   Maeola Harman, MD  ibuprofen (ADVIL) 200 MG tablet Take 400 mg by mouth every 6 (six) hours as needed for moderate pain.    [provider]  Methoxy PEG-Epoetin Beta (MIRCERA IJ) Mircera 05/23/22 05/22/23  [provider]  Methoxy PEG-Epoetin Beta (MIRCERA IJ) Mircera 06/09/22 06/08/23  [provider]  midodrine (PROAMATINE) 5 MG tablet Take 5 mg by mouth Every Tuesday,Thursday,and Saturday with dialysis. 06/25/22   [provider]  nitroGLYCERIN (NITROSTAT) 0.4 MG SL tablet Place 1 tablet (0.4 mg total) under the tongue every 5 (five) minutes as needed for chest pain. 05/22/22   Sharlene Dory, PA-C  NOVOLOG MIX 70/30 FLEXPEN (70-30) 100 UNIT/ML FlexPen Inject 30 Units into the skin in the morning and at bedtime. 02/26/16   [provider]  OVER THE COUNTER MEDICATION Take 1 tablet by mouth at bedtime as needed (sleep). Sleep-eze sleep aid    [provider]  oxyCODONE-acetaminophen (PERCOCET/ROXICET) 5-325 MG tablet Take 1 tablet by mouth every 6 (six) hours as needed. 08/05/22   Triplett, Tammy, PA-C  sevelamer carbonate (RENVELA) 800 MG tablet  Take 1 tablet (800 mg total) by mouth 3 (three) times daily with meals. Patient taking differently: Take 2,400 mg by mouth 3 (three) times daily with meals. 11/03/19   [name], Earl Lites, MD  traMADol (ULTRAM) 50 MG tablet Take 50 mg by mouth daily as needed for moderate pain. 08/12/22   [provider]      Allergies    Shellfish allergy    Review of Systems   Review of Systems  Physical Exam Updated Vital Signs BP (!) 210/97   Pulse (!) 106   Temp 98.1 F (36.7 C) (Oral)   Resp 18    Ht 5\' 11"  (1.803 m)   Wt 98.2 kg   SpO2 96%   BMI 30.19 kg/m  Physical Exam Vitals and nursing note reviewed.  Constitutional:      General: He is not in acute distress.    Appearance: He is well-developed.  HENT:     Head: Normocephalic and atraumatic.     Right Ear: External ear normal.     Left Ear: External ear normal.     Nose: Nose normal.  Eyes:     Extraocular Movements: Extraocular movements intact.     Conjunctiva/sclera: Conjunctivae normal.     Pupils: Pupils are equal, round, and reactive to light.  Cardiovascular:     Rate and Rhythm: Normal rate and regular rhythm.     Heart sounds: Normal heart sounds.  Pulmonary:     Effort: Pulmonary effort is normal. No respiratory distress.     Breath sounds: Normal breath sounds.  Abdominal:     Palpations: Abdomen is soft.  Musculoskeletal:     Cervical back: Normal range of motion and neck supple.     Comments: Fistula in right upper extremity with bruit and thrill.  Skin:    General: Skin is warm and dry.  Neurological:     Mental Status: He is alert. Mental status is at baseline.  Psychiatric:        Mood and Affect: Mood normal.        Behavior: Behavior normal.     ED Results / Procedures / Treatments   Labs (all labs ordered are listed, but only abnormal results are displayed) Labs Reviewed  BASIC METABOLIC PANEL  CBC  TROPONIN I (HIGH SENSITIVITY)    EKG EKG Interpretation Date/Time:  Friday October 23 2022 16:42:36 EDT Ventricular Rate:  102 PR Interval:  182 QRS Duration:  174 QT Interval:  396 QTC Calculation: 516 R Axis:   -83  Text Interpretation: Atrial-sensed ventricular-paced rhythm with Premature atrial complexes with Abberant conduction Abnormal ECG Confirmed by Vonita Moss [phone]) on 10/23/2022 4:47:25 PM  Radiology No results found.  Procedures Procedures  {Document cardiac monitor, telemetry assessment procedure when appropriate:1}  Medications Ordered in  ED Medications - No data to display  ED Course/ Medical Decision Making/ A&P   {   Click here for ABCD2, HEART and other calculatorsREFRESH Note before signing :1}                              Medical Decision Making Amount and/or Complexity of Data Reviewed Labs: ordered. Radiology: ordered.  Risk OTC drugs. Prescription drug management.   Bradley Soto is a 82 y.o. male with comorbidities that complicate the patient evaluation including CAD status post PCI, CABG, ESRD on IHD, PAF not on AC, sick sinus syndrome, and diabetes who presents emergency department chest discomfort.  Initial Ddx:  ***   MDM/Course:  *** Upon re-evaluation ***  This patient presents to the ED for concern of complaints listed in HPI, this involves an extensive number of treatment options, and is a complaint that carries with it a high risk of complications and morbidity. Disposition including potential need for admission considered.   Dispo: {Disposition:28069}  Additional history obtained from {Additional History:28067} Records reviewed {Records Reviewed:28068} The following labs were independently interpreted: {labs interpreted:28064} and show {lab findings:28250} I independently reviewed the following imaging with scope of interpretation limited to determining acute life threatening conditions related to emergency care: {imaging interpreted:28065} and agree with the radiologist interpretation with the following exceptions: none I personally reviewed and interpreted cardiac monitoring: {cardiac monitoring:28251} I personally reviewed and interpreted the pt's EKG: see above for interpretation  I have reviewed the patients home medications and made adjustments as needed Consults: {Consultants:28063} Social Determinants of health:  ***   {Document critical care time when appropriate:1} {Document review of labs and clinical decision tools ie heart score, Chads2Vasc2 etc:1}  {Document your  independent review of radiology images, and any outside records:1} {Document your discussion with family members, caretakers, and with consultants:1} {Document social determinants of health affecting pt's care:1} {Document your decision making why or why not admission, treatments were needed:1} Final Clinical Impression(s) / ED Diagnoses Final diagnoses:  None    Rx / DC Orders ED Discharge Orders     None

## 2022-10-23 NOTE — ED Notes (Signed)
Pt is a&ox4, pwd. Pt currently denies any chest pain or shob. He states that he had some intermittent chest pain earlier while watching TV. He took a nitroglycerin and the pain went away. Pt is saying that he will be going home, does not want any medication and is refusing an IV. Pt is attached to the monitor/vitals. Side rails up x 2, call light with patient. Wife at the bedside.

## 2022-10-24 DIAGNOSIS — E1122 Type 2 diabetes mellitus with diabetic chronic kidney disease: Secondary | ICD-10-CM | POA: Diagnosis not present

## 2022-10-24 DIAGNOSIS — N186 End stage renal disease: Secondary | ICD-10-CM | POA: Diagnosis not present

## 2022-10-24 DIAGNOSIS — Z992 Dependence on renal dialysis: Secondary | ICD-10-CM | POA: Diagnosis not present

## 2022-10-24 DIAGNOSIS — N2581 Secondary hyperparathyroidism of renal origin: Secondary | ICD-10-CM | POA: Diagnosis not present

## 2022-10-24 DIAGNOSIS — D631 Anemia in chronic kidney disease: Secondary | ICD-10-CM | POA: Diagnosis not present

## 2022-10-24 DIAGNOSIS — D509 Iron deficiency anemia, unspecified: Secondary | ICD-10-CM | POA: Diagnosis not present

## 2022-10-27 DIAGNOSIS — D631 Anemia in chronic kidney disease: Secondary | ICD-10-CM | POA: Diagnosis not present

## 2022-10-27 DIAGNOSIS — D509 Iron deficiency anemia, unspecified: Secondary | ICD-10-CM | POA: Diagnosis not present

## 2022-10-27 DIAGNOSIS — Z992 Dependence on renal dialysis: Secondary | ICD-10-CM | POA: Diagnosis not present

## 2022-10-27 DIAGNOSIS — N2581 Secondary hyperparathyroidism of renal origin: Secondary | ICD-10-CM | POA: Diagnosis not present

## 2022-10-27 DIAGNOSIS — N186 End stage renal disease: Secondary | ICD-10-CM | POA: Diagnosis not present

## 2022-10-27 DIAGNOSIS — E1122 Type 2 diabetes mellitus with diabetic chronic kidney disease: Secondary | ICD-10-CM | POA: Diagnosis not present

## 2022-10-29 DIAGNOSIS — E1122 Type 2 diabetes mellitus with diabetic chronic kidney disease: Secondary | ICD-10-CM | POA: Diagnosis not present

## 2022-10-29 DIAGNOSIS — Z992 Dependence on renal dialysis: Secondary | ICD-10-CM | POA: Diagnosis not present

## 2022-10-29 DIAGNOSIS — N2581 Secondary hyperparathyroidism of renal origin: Secondary | ICD-10-CM | POA: Diagnosis not present

## 2022-10-29 DIAGNOSIS — D631 Anemia in chronic kidney disease: Secondary | ICD-10-CM | POA: Diagnosis not present

## 2022-10-29 DIAGNOSIS — D509 Iron deficiency anemia, unspecified: Secondary | ICD-10-CM | POA: Diagnosis not present

## 2022-10-29 DIAGNOSIS — N186 End stage renal disease: Secondary | ICD-10-CM | POA: Diagnosis not present

## 2022-10-31 DIAGNOSIS — E1122 Type 2 diabetes mellitus with diabetic chronic kidney disease: Secondary | ICD-10-CM | POA: Diagnosis not present

## 2022-10-31 DIAGNOSIS — D509 Iron deficiency anemia, unspecified: Secondary | ICD-10-CM | POA: Diagnosis not present

## 2022-10-31 DIAGNOSIS — N2581 Secondary hyperparathyroidism of renal origin: Secondary | ICD-10-CM | POA: Diagnosis not present

## 2022-10-31 DIAGNOSIS — N186 End stage renal disease: Secondary | ICD-10-CM | POA: Diagnosis not present

## 2022-10-31 DIAGNOSIS — D631 Anemia in chronic kidney disease: Secondary | ICD-10-CM | POA: Diagnosis not present

## 2022-10-31 DIAGNOSIS — Z992 Dependence on renal dialysis: Secondary | ICD-10-CM | POA: Diagnosis not present

## 2022-11-03 DIAGNOSIS — E1122 Type 2 diabetes mellitus with diabetic chronic kidney disease: Secondary | ICD-10-CM | POA: Diagnosis not present

## 2022-11-03 DIAGNOSIS — Z992 Dependence on renal dialysis: Secondary | ICD-10-CM | POA: Diagnosis not present

## 2022-11-03 DIAGNOSIS — D509 Iron deficiency anemia, unspecified: Secondary | ICD-10-CM | POA: Diagnosis not present

## 2022-11-03 DIAGNOSIS — D631 Anemia in chronic kidney disease: Secondary | ICD-10-CM | POA: Diagnosis not present

## 2022-11-03 DIAGNOSIS — N186 End stage renal disease: Secondary | ICD-10-CM | POA: Diagnosis not present

## 2022-11-03 DIAGNOSIS — N2581 Secondary hyperparathyroidism of renal origin: Secondary | ICD-10-CM | POA: Diagnosis not present

## 2022-11-05 DIAGNOSIS — D509 Iron deficiency anemia, unspecified: Secondary | ICD-10-CM | POA: Diagnosis not present

## 2022-11-05 DIAGNOSIS — E1122 Type 2 diabetes mellitus with diabetic chronic kidney disease: Secondary | ICD-10-CM | POA: Diagnosis not present

## 2022-11-05 DIAGNOSIS — Z992 Dependence on renal dialysis: Secondary | ICD-10-CM | POA: Diagnosis not present

## 2022-11-05 DIAGNOSIS — D631 Anemia in chronic kidney disease: Secondary | ICD-10-CM | POA: Diagnosis not present

## 2022-11-05 DIAGNOSIS — N186 End stage renal disease: Secondary | ICD-10-CM | POA: Diagnosis not present

## 2022-11-05 DIAGNOSIS — N2581 Secondary hyperparathyroidism of renal origin: Secondary | ICD-10-CM | POA: Diagnosis not present

## 2022-11-07 DIAGNOSIS — D631 Anemia in chronic kidney disease: Secondary | ICD-10-CM | POA: Diagnosis not present

## 2022-11-07 DIAGNOSIS — N2581 Secondary hyperparathyroidism of renal origin: Secondary | ICD-10-CM | POA: Diagnosis not present

## 2022-11-07 DIAGNOSIS — E1122 Type 2 diabetes mellitus with diabetic chronic kidney disease: Secondary | ICD-10-CM | POA: Diagnosis not present

## 2022-11-07 DIAGNOSIS — D509 Iron deficiency anemia, unspecified: Secondary | ICD-10-CM | POA: Diagnosis not present

## 2022-11-07 DIAGNOSIS — Z992 Dependence on renal dialysis: Secondary | ICD-10-CM | POA: Diagnosis not present

## 2022-11-07 DIAGNOSIS — N186 End stage renal disease: Secondary | ICD-10-CM | POA: Diagnosis not present

## 2022-11-10 DIAGNOSIS — N186 End stage renal disease: Secondary | ICD-10-CM | POA: Diagnosis not present

## 2022-11-10 DIAGNOSIS — D631 Anemia in chronic kidney disease: Secondary | ICD-10-CM | POA: Diagnosis not present

## 2022-11-10 DIAGNOSIS — Z992 Dependence on renal dialysis: Secondary | ICD-10-CM | POA: Diagnosis not present

## 2022-11-10 DIAGNOSIS — D509 Iron deficiency anemia, unspecified: Secondary | ICD-10-CM | POA: Diagnosis not present

## 2022-11-10 DIAGNOSIS — E1122 Type 2 diabetes mellitus with diabetic chronic kidney disease: Secondary | ICD-10-CM | POA: Diagnosis not present

## 2022-11-10 DIAGNOSIS — N2581 Secondary hyperparathyroidism of renal origin: Secondary | ICD-10-CM | POA: Diagnosis not present

## 2022-11-11 ENCOUNTER — Ambulatory Visit (HOSPITAL_COMMUNITY): Payer: Medicare Other

## 2022-11-12 DIAGNOSIS — N186 End stage renal disease: Secondary | ICD-10-CM | POA: Diagnosis not present

## 2022-11-12 DIAGNOSIS — D631 Anemia in chronic kidney disease: Secondary | ICD-10-CM | POA: Diagnosis not present

## 2022-11-12 DIAGNOSIS — D509 Iron deficiency anemia, unspecified: Secondary | ICD-10-CM | POA: Diagnosis not present

## 2022-11-12 DIAGNOSIS — N2581 Secondary hyperparathyroidism of renal origin: Secondary | ICD-10-CM | POA: Diagnosis not present

## 2022-11-12 DIAGNOSIS — Z992 Dependence on renal dialysis: Secondary | ICD-10-CM | POA: Diagnosis not present

## 2022-11-12 DIAGNOSIS — E1122 Type 2 diabetes mellitus with diabetic chronic kidney disease: Secondary | ICD-10-CM | POA: Diagnosis not present

## 2022-11-14 DIAGNOSIS — E1122 Type 2 diabetes mellitus with diabetic chronic kidney disease: Secondary | ICD-10-CM | POA: Diagnosis not present

## 2022-11-14 DIAGNOSIS — D631 Anemia in chronic kidney disease: Secondary | ICD-10-CM | POA: Diagnosis not present

## 2022-11-14 DIAGNOSIS — N186 End stage renal disease: Secondary | ICD-10-CM | POA: Diagnosis not present

## 2022-11-14 DIAGNOSIS — D509 Iron deficiency anemia, unspecified: Secondary | ICD-10-CM | POA: Diagnosis not present

## 2022-11-14 DIAGNOSIS — Z992 Dependence on renal dialysis: Secondary | ICD-10-CM | POA: Diagnosis not present

## 2022-11-14 DIAGNOSIS — N2581 Secondary hyperparathyroidism of renal origin: Secondary | ICD-10-CM | POA: Diagnosis not present

## 2022-11-17 DIAGNOSIS — I129 Hypertensive chronic kidney disease with stage 1 through stage 4 chronic kidney disease, or unspecified chronic kidney disease: Secondary | ICD-10-CM | POA: Diagnosis not present

## 2022-11-17 DIAGNOSIS — D631 Anemia in chronic kidney disease: Secondary | ICD-10-CM | POA: Diagnosis not present

## 2022-11-17 DIAGNOSIS — D509 Iron deficiency anemia, unspecified: Secondary | ICD-10-CM | POA: Diagnosis not present

## 2022-11-17 DIAGNOSIS — E1122 Type 2 diabetes mellitus with diabetic chronic kidney disease: Secondary | ICD-10-CM | POA: Diagnosis not present

## 2022-11-17 DIAGNOSIS — Z992 Dependence on renal dialysis: Secondary | ICD-10-CM | POA: Diagnosis not present

## 2022-11-17 DIAGNOSIS — N2581 Secondary hyperparathyroidism of renal origin: Secondary | ICD-10-CM | POA: Diagnosis not present

## 2022-11-17 DIAGNOSIS — Z23 Encounter for immunization: Secondary | ICD-10-CM | POA: Diagnosis not present

## 2022-11-17 DIAGNOSIS — N186 End stage renal disease: Secondary | ICD-10-CM | POA: Diagnosis not present

## 2022-11-19 DIAGNOSIS — D631 Anemia in chronic kidney disease: Secondary | ICD-10-CM | POA: Diagnosis not present

## 2022-11-19 DIAGNOSIS — D509 Iron deficiency anemia, unspecified: Secondary | ICD-10-CM | POA: Diagnosis not present

## 2022-11-19 DIAGNOSIS — Z992 Dependence on renal dialysis: Secondary | ICD-10-CM | POA: Diagnosis not present

## 2022-11-19 DIAGNOSIS — N186 End stage renal disease: Secondary | ICD-10-CM | POA: Diagnosis not present

## 2022-11-19 DIAGNOSIS — E1122 Type 2 diabetes mellitus with diabetic chronic kidney disease: Secondary | ICD-10-CM | POA: Diagnosis not present

## 2022-11-19 DIAGNOSIS — N2581 Secondary hyperparathyroidism of renal origin: Secondary | ICD-10-CM | POA: Diagnosis not present

## 2022-11-21 DIAGNOSIS — E1122 Type 2 diabetes mellitus with diabetic chronic kidney disease: Secondary | ICD-10-CM | POA: Diagnosis not present

## 2022-11-21 DIAGNOSIS — N186 End stage renal disease: Secondary | ICD-10-CM | POA: Diagnosis not present

## 2022-11-21 DIAGNOSIS — D631 Anemia in chronic kidney disease: Secondary | ICD-10-CM | POA: Diagnosis not present

## 2022-11-21 DIAGNOSIS — Z992 Dependence on renal dialysis: Secondary | ICD-10-CM | POA: Diagnosis not present

## 2022-11-21 DIAGNOSIS — N2581 Secondary hyperparathyroidism of renal origin: Secondary | ICD-10-CM | POA: Diagnosis not present

## 2022-11-21 DIAGNOSIS — D509 Iron deficiency anemia, unspecified: Secondary | ICD-10-CM | POA: Diagnosis not present

## 2022-11-24 DIAGNOSIS — E1122 Type 2 diabetes mellitus with diabetic chronic kidney disease: Secondary | ICD-10-CM | POA: Diagnosis not present

## 2022-11-24 DIAGNOSIS — D509 Iron deficiency anemia, unspecified: Secondary | ICD-10-CM | POA: Diagnosis not present

## 2022-11-24 DIAGNOSIS — N186 End stage renal disease: Secondary | ICD-10-CM | POA: Diagnosis not present

## 2022-11-24 DIAGNOSIS — N2581 Secondary hyperparathyroidism of renal origin: Secondary | ICD-10-CM | POA: Diagnosis not present

## 2022-11-24 DIAGNOSIS — Z992 Dependence on renal dialysis: Secondary | ICD-10-CM | POA: Diagnosis not present

## 2022-11-24 DIAGNOSIS — D631 Anemia in chronic kidney disease: Secondary | ICD-10-CM | POA: Diagnosis not present

## 2022-11-26 DIAGNOSIS — N2581 Secondary hyperparathyroidism of renal origin: Secondary | ICD-10-CM | POA: Diagnosis not present

## 2022-11-26 DIAGNOSIS — D631 Anemia in chronic kidney disease: Secondary | ICD-10-CM | POA: Diagnosis not present

## 2022-11-26 DIAGNOSIS — Z992 Dependence on renal dialysis: Secondary | ICD-10-CM | POA: Diagnosis not present

## 2022-11-26 DIAGNOSIS — N186 End stage renal disease: Secondary | ICD-10-CM | POA: Diagnosis not present

## 2022-11-26 DIAGNOSIS — D509 Iron deficiency anemia, unspecified: Secondary | ICD-10-CM | POA: Diagnosis not present

## 2022-11-26 DIAGNOSIS — E1122 Type 2 diabetes mellitus with diabetic chronic kidney disease: Secondary | ICD-10-CM | POA: Diagnosis not present

## 2022-11-27 ENCOUNTER — Ambulatory Visit (INDEPENDENT_AMBULATORY_CARE_PROVIDER_SITE_OTHER): Payer: Medicare Other

## 2022-11-27 DIAGNOSIS — I495 Sick sinus syndrome: Secondary | ICD-10-CM

## 2022-11-27 LAB — CUP PACEART REMOTE DEVICE CHECK
Battery Remaining Longevity: 115 mo
Battery Voltage: 2.99 V
Brady Statistic AP VP Percent: 13.86 %
Brady Statistic AP VS Percent: 0.01 %
Brady Statistic AS VP Percent: 85.52 %
Brady Statistic AS VS Percent: 0.61 %
Brady Statistic RA Percent Paced: 13.98 %
Brady Statistic RV Percent Paced: 99.38 %
Date Time Interrogation Session: 20241010200735
Implantable Lead Connection Status: 753985
Implantable Lead Connection Status: 753985
Implantable Lead Implant Date: 20020325
Implantable Lead Implant Date: 20020325
Implantable Lead Location: 753859
Implantable Lead Location: 753860
Implantable Lead Model: 4092
Implantable Lead Model: 4524
Implantable Pulse Generator Implant Date: 20210415
Lead Channel Impedance Value: 247 Ohm
Lead Channel Impedance Value: 323 Ohm
Lead Channel Impedance Value: 456 Ohm
Lead Channel Impedance Value: 665 Ohm
Lead Channel Pacing Threshold Amplitude: 0.5 V
Lead Channel Pacing Threshold Amplitude: 2.5 V
Lead Channel Pacing Threshold Pulse Width: 0.4 ms
Lead Channel Pacing Threshold Pulse Width: 0.4 ms
Lead Channel Sensing Intrinsic Amplitude: 0.875 mV
Lead Channel Sensing Intrinsic Amplitude: 0.875 mV
Lead Channel Sensing Intrinsic Amplitude: 23.5 mV
Lead Channel Sensing Intrinsic Amplitude: 23.5 mV
Lead Channel Setting Pacing Amplitude: 2.5 V
Lead Channel Setting Pacing Amplitude: 3 V
Lead Channel Setting Pacing Pulse Width: 0.4 ms
Lead Channel Setting Sensing Sensitivity: 1.2 mV
Zone Setting Status: 755011
Zone Setting Status: 755011

## 2022-11-28 DIAGNOSIS — E1122 Type 2 diabetes mellitus with diabetic chronic kidney disease: Secondary | ICD-10-CM | POA: Diagnosis not present

## 2022-11-28 DIAGNOSIS — N186 End stage renal disease: Secondary | ICD-10-CM | POA: Diagnosis not present

## 2022-11-28 DIAGNOSIS — D509 Iron deficiency anemia, unspecified: Secondary | ICD-10-CM | POA: Diagnosis not present

## 2022-11-28 DIAGNOSIS — N2581 Secondary hyperparathyroidism of renal origin: Secondary | ICD-10-CM | POA: Diagnosis not present

## 2022-11-28 DIAGNOSIS — D631 Anemia in chronic kidney disease: Secondary | ICD-10-CM | POA: Diagnosis not present

## 2022-11-28 DIAGNOSIS — Z992 Dependence on renal dialysis: Secondary | ICD-10-CM | POA: Diagnosis not present

## 2022-12-01 DIAGNOSIS — N186 End stage renal disease: Secondary | ICD-10-CM | POA: Diagnosis not present

## 2022-12-01 DIAGNOSIS — N2581 Secondary hyperparathyroidism of renal origin: Secondary | ICD-10-CM | POA: Diagnosis not present

## 2022-12-01 DIAGNOSIS — D631 Anemia in chronic kidney disease: Secondary | ICD-10-CM | POA: Diagnosis not present

## 2022-12-01 DIAGNOSIS — Z992 Dependence on renal dialysis: Secondary | ICD-10-CM | POA: Diagnosis not present

## 2022-12-01 DIAGNOSIS — E1122 Type 2 diabetes mellitus with diabetic chronic kidney disease: Secondary | ICD-10-CM | POA: Diagnosis not present

## 2022-12-01 DIAGNOSIS — D509 Iron deficiency anemia, unspecified: Secondary | ICD-10-CM | POA: Diagnosis not present

## 2022-12-03 DIAGNOSIS — N186 End stage renal disease: Secondary | ICD-10-CM | POA: Diagnosis not present

## 2022-12-03 DIAGNOSIS — D509 Iron deficiency anemia, unspecified: Secondary | ICD-10-CM | POA: Diagnosis not present

## 2022-12-03 DIAGNOSIS — E1122 Type 2 diabetes mellitus with diabetic chronic kidney disease: Secondary | ICD-10-CM | POA: Diagnosis not present

## 2022-12-03 DIAGNOSIS — N2581 Secondary hyperparathyroidism of renal origin: Secondary | ICD-10-CM | POA: Diagnosis not present

## 2022-12-03 DIAGNOSIS — Z992 Dependence on renal dialysis: Secondary | ICD-10-CM | POA: Diagnosis not present

## 2022-12-03 DIAGNOSIS — D631 Anemia in chronic kidney disease: Secondary | ICD-10-CM | POA: Diagnosis not present

## 2022-12-05 DIAGNOSIS — E1122 Type 2 diabetes mellitus with diabetic chronic kidney disease: Secondary | ICD-10-CM | POA: Diagnosis not present

## 2022-12-05 DIAGNOSIS — D631 Anemia in chronic kidney disease: Secondary | ICD-10-CM | POA: Diagnosis not present

## 2022-12-05 DIAGNOSIS — D509 Iron deficiency anemia, unspecified: Secondary | ICD-10-CM | POA: Diagnosis not present

## 2022-12-05 DIAGNOSIS — Z992 Dependence on renal dialysis: Secondary | ICD-10-CM | POA: Diagnosis not present

## 2022-12-05 DIAGNOSIS — N2581 Secondary hyperparathyroidism of renal origin: Secondary | ICD-10-CM | POA: Diagnosis not present

## 2022-12-05 DIAGNOSIS — N186 End stage renal disease: Secondary | ICD-10-CM | POA: Diagnosis not present

## 2022-12-07 NOTE — Progress Notes (Signed)
Remote pacemaker transmission.   

## 2022-12-08 DIAGNOSIS — D509 Iron deficiency anemia, unspecified: Secondary | ICD-10-CM | POA: Diagnosis not present

## 2022-12-08 DIAGNOSIS — N2581 Secondary hyperparathyroidism of renal origin: Secondary | ICD-10-CM | POA: Diagnosis not present

## 2022-12-08 DIAGNOSIS — E1122 Type 2 diabetes mellitus with diabetic chronic kidney disease: Secondary | ICD-10-CM | POA: Diagnosis not present

## 2022-12-08 DIAGNOSIS — D631 Anemia in chronic kidney disease: Secondary | ICD-10-CM | POA: Diagnosis not present

## 2022-12-08 DIAGNOSIS — N186 End stage renal disease: Secondary | ICD-10-CM | POA: Diagnosis not present

## 2022-12-08 DIAGNOSIS — Z992 Dependence on renal dialysis: Secondary | ICD-10-CM | POA: Diagnosis not present

## 2022-12-10 DIAGNOSIS — Z992 Dependence on renal dialysis: Secondary | ICD-10-CM | POA: Diagnosis not present

## 2022-12-10 DIAGNOSIS — N186 End stage renal disease: Secondary | ICD-10-CM | POA: Diagnosis not present

## 2022-12-10 DIAGNOSIS — N2581 Secondary hyperparathyroidism of renal origin: Secondary | ICD-10-CM | POA: Diagnosis not present

## 2022-12-10 DIAGNOSIS — D631 Anemia in chronic kidney disease: Secondary | ICD-10-CM | POA: Diagnosis not present

## 2022-12-10 DIAGNOSIS — D509 Iron deficiency anemia, unspecified: Secondary | ICD-10-CM | POA: Diagnosis not present

## 2022-12-10 DIAGNOSIS — E1122 Type 2 diabetes mellitus with diabetic chronic kidney disease: Secondary | ICD-10-CM | POA: Diagnosis not present

## 2022-12-12 DIAGNOSIS — D631 Anemia in chronic kidney disease: Secondary | ICD-10-CM | POA: Diagnosis not present

## 2022-12-12 DIAGNOSIS — Z992 Dependence on renal dialysis: Secondary | ICD-10-CM | POA: Diagnosis not present

## 2022-12-12 DIAGNOSIS — N186 End stage renal disease: Secondary | ICD-10-CM | POA: Diagnosis not present

## 2022-12-12 DIAGNOSIS — E1122 Type 2 diabetes mellitus with diabetic chronic kidney disease: Secondary | ICD-10-CM | POA: Diagnosis not present

## 2022-12-12 DIAGNOSIS — D509 Iron deficiency anemia, unspecified: Secondary | ICD-10-CM | POA: Diagnosis not present

## 2022-12-12 DIAGNOSIS — N2581 Secondary hyperparathyroidism of renal origin: Secondary | ICD-10-CM | POA: Diagnosis not present

## 2022-12-15 DIAGNOSIS — D509 Iron deficiency anemia, unspecified: Secondary | ICD-10-CM | POA: Diagnosis not present

## 2022-12-15 DIAGNOSIS — Z992 Dependence on renal dialysis: Secondary | ICD-10-CM | POA: Diagnosis not present

## 2022-12-15 DIAGNOSIS — N186 End stage renal disease: Secondary | ICD-10-CM | POA: Diagnosis not present

## 2022-12-15 DIAGNOSIS — E1122 Type 2 diabetes mellitus with diabetic chronic kidney disease: Secondary | ICD-10-CM | POA: Diagnosis not present

## 2022-12-15 DIAGNOSIS — N2581 Secondary hyperparathyroidism of renal origin: Secondary | ICD-10-CM | POA: Diagnosis not present

## 2022-12-15 DIAGNOSIS — D631 Anemia in chronic kidney disease: Secondary | ICD-10-CM | POA: Diagnosis not present

## 2022-12-16 ENCOUNTER — Ambulatory Visit (HOSPITAL_COMMUNITY)
Admission: RE | Admit: 2022-12-16 | Discharge: 2022-12-16 | Disposition: A | Discharge status: Home / Self Care | Payer: Medicare Other | Source: Ambulatory Visit | Attending: Physician Assistant | Admitting: Physician Assistant

## 2022-12-16 DIAGNOSIS — R918 Other nonspecific abnormal finding of lung field: Secondary | ICD-10-CM | POA: Diagnosis not present

## 2022-12-16 DIAGNOSIS — R911 Solitary pulmonary nodule: Secondary | ICD-10-CM | POA: Diagnosis not present

## 2022-12-16 DIAGNOSIS — J439 Emphysema, unspecified: Secondary | ICD-10-CM | POA: Diagnosis not present

## 2022-12-16 DIAGNOSIS — J929 Pleural plaque without asbestos: Secondary | ICD-10-CM | POA: Diagnosis not present

## 2022-12-17 DIAGNOSIS — Z992 Dependence on renal dialysis: Secondary | ICD-10-CM | POA: Diagnosis not present

## 2022-12-17 DIAGNOSIS — D509 Iron deficiency anemia, unspecified: Secondary | ICD-10-CM | POA: Diagnosis not present

## 2022-12-17 DIAGNOSIS — E1122 Type 2 diabetes mellitus with diabetic chronic kidney disease: Secondary | ICD-10-CM | POA: Diagnosis not present

## 2022-12-17 DIAGNOSIS — D631 Anemia in chronic kidney disease: Secondary | ICD-10-CM | POA: Diagnosis not present

## 2022-12-17 DIAGNOSIS — N186 End stage renal disease: Secondary | ICD-10-CM | POA: Diagnosis not present

## 2022-12-17 DIAGNOSIS — N2581 Secondary hyperparathyroidism of renal origin: Secondary | ICD-10-CM | POA: Diagnosis not present

## 2022-12-18 DIAGNOSIS — Z992 Dependence on renal dialysis: Secondary | ICD-10-CM | POA: Diagnosis not present

## 2022-12-18 DIAGNOSIS — I129 Hypertensive chronic kidney disease with stage 1 through stage 4 chronic kidney disease, or unspecified chronic kidney disease: Secondary | ICD-10-CM | POA: Diagnosis not present

## 2022-12-18 DIAGNOSIS — N186 End stage renal disease: Secondary | ICD-10-CM | POA: Diagnosis not present

## 2022-12-19 DIAGNOSIS — N2581 Secondary hyperparathyroidism of renal origin: Secondary | ICD-10-CM | POA: Diagnosis not present

## 2022-12-19 DIAGNOSIS — D509 Iron deficiency anemia, unspecified: Secondary | ICD-10-CM | POA: Diagnosis not present

## 2022-12-19 DIAGNOSIS — D631 Anemia in chronic kidney disease: Secondary | ICD-10-CM | POA: Diagnosis not present

## 2022-12-19 DIAGNOSIS — N186 End stage renal disease: Secondary | ICD-10-CM | POA: Diagnosis not present

## 2022-12-19 DIAGNOSIS — Z992 Dependence on renal dialysis: Secondary | ICD-10-CM | POA: Diagnosis not present

## 2022-12-19 DIAGNOSIS — E1122 Type 2 diabetes mellitus with diabetic chronic kidney disease: Secondary | ICD-10-CM | POA: Diagnosis not present

## 2022-12-21 ENCOUNTER — Telehealth: Payer: Self-pay | Admitting: Physician Assistant

## 2022-12-21 NOTE — Telephone Encounter (Signed)
Advised patient's wife that his CT scan is not available yet, we will give them a call once we have them and Bradley Soto has reviewed.

## 2022-12-21 NOTE — Telephone Encounter (Signed)
Patient's spouse is calling to get CT results

## 2022-12-22 DIAGNOSIS — D631 Anemia in chronic kidney disease: Secondary | ICD-10-CM | POA: Diagnosis not present

## 2022-12-22 DIAGNOSIS — N186 End stage renal disease: Secondary | ICD-10-CM | POA: Diagnosis not present

## 2022-12-22 DIAGNOSIS — E1122 Type 2 diabetes mellitus with diabetic chronic kidney disease: Secondary | ICD-10-CM | POA: Diagnosis not present

## 2022-12-22 DIAGNOSIS — D509 Iron deficiency anemia, unspecified: Secondary | ICD-10-CM | POA: Diagnosis not present

## 2022-12-22 DIAGNOSIS — N2581 Secondary hyperparathyroidism of renal origin: Secondary | ICD-10-CM | POA: Diagnosis not present

## 2022-12-22 DIAGNOSIS — Z992 Dependence on renal dialysis: Secondary | ICD-10-CM | POA: Diagnosis not present

## 2022-12-24 DIAGNOSIS — Z992 Dependence on renal dialysis: Secondary | ICD-10-CM | POA: Diagnosis not present

## 2022-12-24 DIAGNOSIS — N186 End stage renal disease: Secondary | ICD-10-CM | POA: Diagnosis not present

## 2022-12-24 DIAGNOSIS — D509 Iron deficiency anemia, unspecified: Secondary | ICD-10-CM | POA: Diagnosis not present

## 2022-12-24 DIAGNOSIS — E1122 Type 2 diabetes mellitus with diabetic chronic kidney disease: Secondary | ICD-10-CM | POA: Diagnosis not present

## 2022-12-24 DIAGNOSIS — D631 Anemia in chronic kidney disease: Secondary | ICD-10-CM | POA: Diagnosis not present

## 2022-12-24 DIAGNOSIS — N2581 Secondary hyperparathyroidism of renal origin: Secondary | ICD-10-CM | POA: Diagnosis not present

## 2022-12-26 DIAGNOSIS — Z992 Dependence on renal dialysis: Secondary | ICD-10-CM | POA: Diagnosis not present

## 2022-12-26 DIAGNOSIS — E1122 Type 2 diabetes mellitus with diabetic chronic kidney disease: Secondary | ICD-10-CM | POA: Diagnosis not present

## 2022-12-26 DIAGNOSIS — N2581 Secondary hyperparathyroidism of renal origin: Secondary | ICD-10-CM | POA: Diagnosis not present

## 2022-12-26 DIAGNOSIS — N186 End stage renal disease: Secondary | ICD-10-CM | POA: Diagnosis not present

## 2022-12-26 DIAGNOSIS — D631 Anemia in chronic kidney disease: Secondary | ICD-10-CM | POA: Diagnosis not present

## 2022-12-26 DIAGNOSIS — D509 Iron deficiency anemia, unspecified: Secondary | ICD-10-CM | POA: Diagnosis not present

## 2022-12-28 ENCOUNTER — Telehealth: Payer: Self-pay

## 2022-12-28 ENCOUNTER — Telehealth: Payer: Self-pay | Admitting: Physician Assistant

## 2022-12-28 NOTE — Telephone Encounter (Signed)
Call to patient to discuss CT results, no answer, left detailed message per DPR asking patient to call our office to discuss CT results.

## 2022-12-28 NOTE — Telephone Encounter (Signed)
  Pt's wife calling back to follow up CT result

## 2022-12-28 NOTE — Telephone Encounter (Signed)
-----   Message from Cline Crock sent at 12/28/2022  1:54 PM EST ----- He will need follow up in the pulmonary nodule clinic with Kandice Robinsons NP, Dr. Delton Coombes, or Dr. Tonia Brooms, can we please send a referral?

## 2022-12-28 NOTE — Telephone Encounter (Signed)
Pts wife called back asking for the pts recent CT results done 12/16/22.. I advised he that the radiology dept has been backed up and it is taking longer than usual to receive the reports.. but I will forward to [facility] for review and follow up after she receives the results.

## 2022-12-29 ENCOUNTER — Other Ambulatory Visit: Payer: Self-pay

## 2022-12-29 DIAGNOSIS — N2581 Secondary hyperparathyroidism of renal origin: Secondary | ICD-10-CM | POA: Diagnosis not present

## 2022-12-29 DIAGNOSIS — N186 End stage renal disease: Secondary | ICD-10-CM | POA: Diagnosis not present

## 2022-12-29 DIAGNOSIS — D509 Iron deficiency anemia, unspecified: Secondary | ICD-10-CM | POA: Diagnosis not present

## 2022-12-29 DIAGNOSIS — D631 Anemia in chronic kidney disease: Secondary | ICD-10-CM | POA: Diagnosis not present

## 2022-12-29 DIAGNOSIS — E1122 Type 2 diabetes mellitus with diabetic chronic kidney disease: Secondary | ICD-10-CM | POA: Diagnosis not present

## 2022-12-29 DIAGNOSIS — Z992 Dependence on renal dialysis: Secondary | ICD-10-CM | POA: Diagnosis not present

## 2022-12-29 DIAGNOSIS — R911 Solitary pulmonary nodule: Secondary | ICD-10-CM

## 2022-12-29 NOTE — Telephone Encounter (Signed)
  Pt's wife returning call to get CT result

## 2022-12-29 NOTE — Progress Notes (Signed)
Order for referral to pulmonary nodule clinic placed.

## 2022-12-29 NOTE — Telephone Encounter (Signed)
Spoke with pt's wife and went over [employer] with her. Explained that the referral has been ordered for the pulmonary nodule clinic and they will be reaching out to schedule an appointment. Pt's wife verbalized understanding and had no further questions.

## 2022-12-31 DIAGNOSIS — D631 Anemia in chronic kidney disease: Secondary | ICD-10-CM | POA: Diagnosis not present

## 2022-12-31 DIAGNOSIS — N2581 Secondary hyperparathyroidism of renal origin: Secondary | ICD-10-CM | POA: Diagnosis not present

## 2022-12-31 DIAGNOSIS — N186 End stage renal disease: Secondary | ICD-10-CM | POA: Diagnosis not present

## 2022-12-31 DIAGNOSIS — E1122 Type 2 diabetes mellitus with diabetic chronic kidney disease: Secondary | ICD-10-CM | POA: Diagnosis not present

## 2022-12-31 DIAGNOSIS — Z992 Dependence on renal dialysis: Secondary | ICD-10-CM | POA: Diagnosis not present

## 2022-12-31 DIAGNOSIS — D509 Iron deficiency anemia, unspecified: Secondary | ICD-10-CM | POA: Diagnosis not present

## 2023-01-02 DIAGNOSIS — Z992 Dependence on renal dialysis: Secondary | ICD-10-CM | POA: Diagnosis not present

## 2023-01-02 DIAGNOSIS — N2581 Secondary hyperparathyroidism of renal origin: Secondary | ICD-10-CM | POA: Diagnosis not present

## 2023-01-02 DIAGNOSIS — E1122 Type 2 diabetes mellitus with diabetic chronic kidney disease: Secondary | ICD-10-CM | POA: Diagnosis not present

## 2023-01-02 DIAGNOSIS — N186 End stage renal disease: Secondary | ICD-10-CM | POA: Diagnosis not present

## 2023-01-02 DIAGNOSIS — D509 Iron deficiency anemia, unspecified: Secondary | ICD-10-CM | POA: Diagnosis not present

## 2023-01-02 DIAGNOSIS — D631 Anemia in chronic kidney disease: Secondary | ICD-10-CM | POA: Diagnosis not present

## 2023-01-03 ENCOUNTER — Encounter: Payer: Self-pay | Admitting: Cardiovascular Disease

## 2023-01-03 NOTE — H&P (View-Only) (Signed)
 Bradley Soto Date of Birth  04/24/1940       [facility]    [place] [address], [address]  [address], [address] [place], [place]  [address]   [place], [place]  [address] [phone]     [phone]   Fax  [phone]     Fax [phone]  Problem List: 1. Coronary artery disease-status post stenting 2. Sick sinus syndrome-status post pacemaker. History of paroxysmal  atrial fib 3. CKD 4. Diabetes Mellitus 5. Gout 6.   Previoius Hx  Pt recently moved from [place], [place] ( near [facility])  Here to establish cardiology care. No CP, no dyspnea, Used to work Holiday representative.   Still works around [employer], Aeronautical engineer.    March 25, 2017: [name] is seen today for a follow-up visit.  I last saw him 4 years ago.  He has been seeing Dr. Johney Frame in the meantime.  Hx of stenting in the past  Was just in the hospital - had the flu.  ( did get the flu shot)  Echocardiogram while in the hospital shows normal left ventricular systolic function with ejection fraction of 60-65%.  He has mild left ventricular hypertrophy.  There is akinesis of the inferior basilar segment.  He has grade 1 diastolic.  dysfunction.  There is mild aortic stenosis.  Now has  No CP . Has some shortness of breath ,  Worse in the cold.   Has gained some weight - since being on Insulin   Jan. 29, 2020   Doing well  He describes having some palpitations that lasted about a month.  He thinks they started around mid December and lasted through the first part of January. It is him as needed for these palpitations.  They seem to have resolved.  He was wondering if they are related to his Tylenol PM. Today shows atrial pacing.  There is no arrhythmias. Is not exercising   Oct. 12, 2020   Bradley Soto is seen today for follow up . Hx of previous CAD and previous coronary stenting .   Stanton was seen in the ER on Oct. 8 but left the waiting room because of prolonged wait time. Has mid  sternal chest pain . Seems worse with movement,  No radiation.  Did not have NTG to take   CP started a week ago .    Has occurred 3-4 times.  Woke up with CP last night Typically occurs at night while sleeping .  Does have CP while walking to the mailbox   Will last for several minutes.   Center of chest,  Radiates to his throat.  This pain is very similar to his previous episodes of CP  Pain was relieved with SL NTG   Not associated with deep breath , not associated with twisting his body .    December 23, 2018: Bradley Soto is seen today for a follow-up visit.  He was having some chest pain when I saw him last month and we referred him for heart catheterization.  Ramus lesion is 99% stenosed. Prox LAD to Mid LAD lesion is 70% stenosed. Dist Cx lesion is 60% stenosed. Mid RCA lesion is 95% stenosed. 3rd Mrg-1 lesion is 80% stenosed. 3rd Mrg-2 lesion is 99% stenosed. 1st LPL lesion is 99% stenosed.   1. Severe multi-vessel CAD 2. Moderately severe mid LAD stenosis 3. Severe restenosis of the stented segment in the proximal segment of the ramus intermediate branch 4. Severe restenosis  mid Circumflex/obtuse marginal stented segment. Severe stenosis in the first obtuse marginal branch beyond the stented segment. Severe stenosis distal Circumflex leading into the moderate caliber second obtuse marginal branch.  5. Severe stenosis mid RCA   Had coronary artery bypass grafting-LIMA to LAD, SVG to RI  - OM3, SVG to D1 Oct. 19, 2020  The distal right coronary artery/posterior descending branch was evaluated but was considered to be too small for bypass grafting. Is healing   He has had some recurrent pleural effusions and had a thoracentesis 2 days ago.   1.4 liters   The procedure was done in special procedures/interventional radiology on November 4.  He had some postoperative atrial fibrillation and is now on amiodarone  He was previously on amlodipine 2.5 mg a day, hydralazine 25 mg 3  times a day, isosorbide 30 mg a day and Eliquis.  These medications have been discontinued.  His simvastatin was discontinued but now he is on Crestor 20 mg a day.  January 25, 2019:  Bradley Soto is seen today for follow-up visit.  He has a history of coronary artery disease and is status post coronary artery bypass grafting ( Oct. 19,. 2020 )   He has a pacemaker. Breathing is going ok Slight DOE ,  No fever,    No angina    Feb. 17, 2023 [name] is seen today for follow up of his CAD, CABG Is on HD now for the past 1.5 years  occasional has hypotension with HD  Is developing peripheral neuropathy in his feet LDL is 16    Aug. 23, 2023  Bradley Soto is seen today  for CAD/ CABG  Has been on HD Having some CP / low level pain  with walking  Similar to his previous angina   He has chest discomfort/pressure walking from his car into the office.  We will schedule him for a cardiac PET scan. We will refer him for heart catheterization if the cardiac PET scan revealed significant ischemia.  Had moderate carotid stenosis of R ICA mild stenosis of L ICA  Will recheck cardotid duplex.   Sept. 6, 2023  Bradley Soto is seen today for recurrent CP / dyspnea  Has been getting dialysis for 2 years Is very anemic .  Hb is 7.5  Is getting iron , epo .   Takes midodrine for dialysis related hypotension  - doesn't work very well according   The plan is for him to get a transfusion - possibly at dialysis  If he is still symptomatic after getting his Hb up, then he will need a cardiac cath    Oct. 16, 2023   Bradley Soto is seen today  Still has some DOE .  Has had 2 transfusions - his last transfutsion was ( sept 7, then end of Sept.)  He symptoms of angina improved significantly with the transfusion.   He thinks he is having more symptoms over the past several days and suspects that he is becoming more anemic .   Nov. 18, 2024 Bradley Soto is seen for follow up of his CAD , CABG,  ESRD on HD   Has symptoms of  angina when he becomes anemic  Some episodes of cp  NTG has helped   Has had TAVR   has been seen by Carlean Jews  Breathing is better following TAVR  Still has some DOE   Had an ER visit in Sept. 2024  With chest pain  Does not do much work  Is unable  to walk   He had stopped taking his aspirin.  He recently restarted his aspirin and this seems to have helped the pain.  If he develops recurrent episodes of what sounds like cardiac chest pain will refer him for a Lexiscan Myoview study or perhaps a cardiac PET scan.       Current Outpatient Medications  Medication Sig Dispense Refill   amoxicillin (AMOXIL) 500 MG tablet Take 4 tablets (2,000 mg total) by mouth as directed. 1 hour prior to dental work including cleanings 12 tablet 12   aspirin EC 81 MG tablet Take 81 mg by mouth in the morning.     B Complex-C-Zn-Folic Acid (DIALYVITE 800 WITH ZINC) 0.8 MG TABS Take 1 tablet by mouth at bedtime.     CALCITRIOL PO Take 1 Dose by mouth Every Tuesday,Thursday,and Saturday with dialysis.     copper tablet Take 4 mg by mouth daily.     CVS VITAMIN B12 1000 MCG tablet TAKE 1 TABLET BY MOUTH EVERY DAY 90 tablet 0   diclofenac Sodium (VOLTAREN) 1 % GEL Apply 1 Application topically 4 (four) times daily as needed (neck pain).     diltiazem (CARDIZEM SR) 60 MG 12 hr capsule Take 1 capsule (60 mg total) by mouth as needed (palpitations). 30 capsule 3   gabapentin (NEURONTIN) 100 MG capsule Take 1 capsule (100 mg total) by mouth 3 (three) times daily. (Patient taking differently: Take 200 mg by mouth at bedtime.) 90 capsule 3   ibuprofen (ADVIL) 200 MG tablet Take 400 mg by mouth every 6 (six) hours as needed for moderate pain.     Methoxy PEG-Epoetin Beta (MIRCERA IJ) Mircera     Methoxy PEG-Epoetin Beta (MIRCERA IJ) Mircera     midodrine (PROAMATINE) 5 MG tablet Take 5 mg by mouth Every Tuesday,Thursday,and Saturday with dialysis.     nitroGLYCERIN (NITROSTAT) 0.4 MG SL tablet Place 1  tablet (0.4 mg total) under the tongue every 5 (five) minutes as needed for chest pain. 25 tablet 3   NOVOLOG MIX 70/30 FLEXPEN (70-30) 100 UNIT/ML FlexPen Inject 30 Units into the skin in the morning and at bedtime.  3   OVER THE COUNTER MEDICATION Take 1 tablet by mouth at bedtime as needed (sleep). Sleep-eze sleep aid     oxyCODONE-acetaminophen (PERCOCET/ROXICET) 5-325 MG tablet Take 1 tablet by mouth every 6 (six) hours as needed. 10 tablet 0   sevelamer carbonate (RENVELA) 800 MG tablet Take 1 tablet (800 mg total) by mouth 3 (three) times daily with meals. (Patient taking differently: Take 2,400 mg by mouth 3 (three) times daily with meals.) 90 tablet 3   traMADol (ULTRAM) 50 MG tablet Take 50 mg by mouth daily as needed for moderate pain.     No current facility-administered medications for this visit.     Allergies  Allergen Reactions   Shellfish Allergy Rash    Past Medical History:  Diagnosis Date   Anxiety    Atrial fibrillation (HCC)    Bell's palsy    left side of face droops   CAD (coronary artery disease)    s/p stent placement   Colon polyps    ESRD (end stage renal disease) (HCC)    [place]- [facility]- [facility]   Gout    History of blood transfusion    History of kidney stones    Passes   HLD (hyperlipidemia)    HTN (hypertension)    Insomnia    MI (myocardial infarction) (HCC) 1998  Neuropathy    NSTEMI (non-ST elevated myocardial infarction) (HCC)    s/p PCI in early 2000s   OSA on CPAP    wears CPAP   Presence of permanent cardiac pacemaker    S/P TAVR (transcatheter aortic valve replacement) 08/25/2022   s/p TAVR with a 26 mm Edwards S3UR via the TF approach by Dr. Lynnette Caffey & Dr. Laneta Simmers   Severe aortic stenosis    Type II or unspecified type diabetes mellitus without mention of complication, not stated as uncontrolled    Wears glasses    reading    Wears partial dentures     Past Surgical History:  Procedure Laterality Date   APPENDECTOMY      AV FISTULA PLACEMENT Right 07/28/2019   Procedure: RIGHT ARM Brachiocephalic ARTERIOVENOUS (AV) FISTULA CREATION;  Surgeon: Nada Libman, MD;  Location: [facility] OR;  Service: Vascular;  Laterality: Right;   CATARACT EXTRACTION     CLIPPING OF ATRIAL APPENDAGE  12/05/2018   Procedure: Clipping Of Atrial Appendage;  Surgeon: Linden Dolin, MD;  Location: [facility] OR;  Service: Open Heart Surgery;;   CORONARY ANGIOPLASTY WITH STENT PLACEMENT     x4   CORONARY ARTERY BYPASS GRAFT N/A 12/05/2018   Procedure: CORONARY ARTERY BYPASS GRAFTING (CABG) X FOUR USING LEFT INTERNAL MAMMARY ARTERY AND RIGHT SAPHENOUS VEIN GRAFTS;  Surgeon: Linden Dolin, MD;  Location: [facility] OR;  Service: Open Heart Surgery;  Laterality: N/A;   HERNIA REPAIR     ventral hernia repair with mesh--revision also was required   INSERT / REPLACE / REMOVE PACEMAKER  05/10/2000   pacemaker implanted in [place]]   INTRAOPERATIVE TRANSTHORACIC ECHOCARDIOGRAM N/A 08/25/2022   Procedure: INTRAOPERATIVE TRANSTHORACIC ECHOCARDIOGRAM;  Surgeon: Orbie Pyo, MD;  Location: [facility] INVASIVE CV LAB;  Service: Open Heart Surgery;  Laterality: N/A;   IR THORACENTESIS ASP PLEURAL SPACE W/IMG GUIDE  12/21/2018   IR THORACENTESIS ASP PLEURAL SPACE W/IMG GUIDE  12/30/2018   LEFT HEART CATH AND CORONARY ANGIOGRAPHY N/A 11/30/2018   Procedure: LEFT HEART CATH AND CORONARY ANGIOGRAPHY;  Surgeon: Kathleene Hazel, MD;  Location: [facility] INVAS[facility] CV [facility];  Service: Cardiovascular;  Laterality: N/A;   MAZE N/A 12/05/2018   Procedure: MAZE;  Surgeon: Linden Dolin, MD;  Location: [facility];  Service: Open Heart Surgery;  Laterality: N/A;   PACEMAKER GENERATOR CHANGE  09/03/09   MDT Adapta generator change in [place]   PARTIAL COLON REMOVAL  2/2   polyp that could not be removed on colonoscopy   PPM GENERATOR CHANGEOUT N/A 06/01/2019   Procedure: PPM GENERATOR CHANGEOUT;  Surgeon: Hillis Range, MD;  [facility]: [facility] INV[facility];  Service: Cardiovascular;   Laterality: N/A;   REVISON OF ARTERIOVENOUS FISTULA Right 01/24/2020   Procedure: BANDING OF RIGHT ARM ARTERIOVENOUS FISTULA;  Surgeon: Nada Libman, MD;  Location: [facility] OR;  Service: Vascular;  Laterality: Right;   RIGHT/LEFT HEART CATH AND CORONARY/GRAFT ANGIOGRAPHY N/A 07/31/2022   Procedure: RIGHT/LEFT HEART CATH AND CORONARY/GRAFT ANGIOGRAPHY;  Surgeon: Marykay Lex, MD;  [facility] INVASIVE CV LAB;  Service: Cardiovascular;  Laterality: N/A;   TEE WITHOUT CARDIOVERSION N/A 12/05/2018   Procedure: TRANSESOPHAGEAL ECHOCARDIOGRAM (TEE);  Surgeon: Linden Dolin, MD;  Location: [facility] OR;  Service: Open Heart Surgery;  Laterality: N/A;   TRANSCATHETER AORTIC VALVE REPLACEMENT, TRANSFEMORAL N/A 08/25/2022   Procedure: Transcatheter Aortic Valve Replacement, Transfemoral;  Surgeon: Orbie Pyo, MD;  Location: [facility] INVASIVE CV LAB;  Service: Open Heart Surgery;  Laterality: N/A;  Social History   Tobacco Use  Smoking Status Former   Types: Cigarettes  Smokeless Tobacco Never  Tobacco Comments   " many years ago"    Social History   Substance and Sexual Activity  Alcohol Use No    Family History  Problem Relation Age of Onset   Heart attack Father 39   Heart disease Father    Breast cancer Sister    Pulmonary embolism Sister    Deep vein thrombosis Sister     Reviw of Systems:  Reviewed in the HPI.  All other systems are negative.   Physical Exam: Blood pressure (!) 140/65, pulse (!) 54, height 5\' 11"  (1.803 m), weight 223 lb (101.2 kg), SpO2 96%.  HYPERTENSION CONTROL Vitals:   01/04/23 1500 01/04/23 1514  BP: (!) 168/70 (!) 140/65    The patient's blood pressure is elevated above target today.  In order to address the patient's elevated BP: Blood pressure will be monitored at home to determine if medication changes need to be made.       GEN:  elderly male,   in no acute distress HEENT: Normal NECK: No JVD;  left carotid bruit  LYMPHATICS: No  lymphadenopathy CARDIAC: RRR , no murmurs, rubs, gallops RESPIRATORY:  Clear to auscultation without rales, wheezing or rhonchi  ABDOMEN: Soft, non-tender, non-distended MUSCULOSKELETAL:  No edema; No deformity  SKIN: Warm and dry NEUROLOGIC:  Alert and oriented x 3    ECG:   Assessment / Plan:   1.  Coronary artery disease: Had a heart catheterization which revealed severe three-vessel coronary artery disease.  He had coronary artery bypass grafting to his LAD, circumflex artery, diagonal vessel.  The right coronary artery was too small to consider bypassing.  He continues to have some episodes of chest discomfort.  He went to the emergency room in September.  His troponins are chronically elevated but were no different than his normal baseline.  We discussed whether or not he needs to have a follow-up Myoview study or cardiac PET scan.  He had stopped taking his aspirin and recently restarted.  He thinks the pains might have improved after starting the aspirin.  Will have him continue aspirin.  His wife will give [place] a call back if these chest pains worsen.  I would consider doing a cardiac PET or perhaps a Myoview study if he has worsening episodes of chest discomfort.  Have given him the standard emergency room precautions to head to the hospital if his chest pain does not resolve with nitroglycerin.     2.  Chronic diastolic congestive heart failure: :    volume is managed by nephrology   3.  Hypertension:     managed by nephrology   4.  ESRD :      5.  Paroxysmal atrial fibrillation:        6.  Carotid artery disease:     Kristeen Miss, MD  01/04/2023 4:07 PM    [facility] [address] [place],  [address] [place], [place]  [address] Pager [phone] Phone: [phone]; Fax: [phone]   .

## 2023-01-03 NOTE — Progress Notes (Unsigned)
Bradley Soto Date of Birth  04/24/1940       [facility]    [place] [address], [address]  [address], [address] [place], [place]  [address]   [place], [place]  [address] [phone]     [phone]   Fax  [phone]     Fax [phone]  Problem List: 1. Coronary artery disease-status post stenting 2. Sick sinus syndrome-status post pacemaker. History of paroxysmal  atrial fib 3. CKD 4. Diabetes Mellitus 5. Gout 6.   Previoius Hx  Pt recently moved from [place], [place] ( near [facility] [employer])  Here to establish cardiology care. No CP, no dyspnea, Used to work Holiday representative.   Still works around [employer], Aeronautical engineer.    March 25, 2017: [name] is seen today for a follow-up visit.  I last saw him 4 years ago.  He has been seeing Dr. Johney Frame in the meantime.  Hx of stenting in the past  Was just in the hospital - had the flu.  ( did get the flu shot)  Echocardiogram while in the hospital shows normal left ventricular systolic function with ejection fraction of 60-65%.  He has mild left ventricular hypertrophy.  There is akinesis of the inferior basilar segment.  He has grade 1 diastolic.  dysfunction.  There is mild aortic stenosis.  Now has  No CP . Has some shortness of breath ,  Worse in the cold.   Has gained some weight - since being on Insulin   Jan. 29, 2020   Doing well  He describes having some palpitations that lasted about a month.  He thinks they started around mid December and lasted through the first part of January. It is him as needed for these palpitations.  They seem to have resolved.  He was wondering if they are related to his Tylenol PM. Today shows atrial pacing.  There is no arrhythmias. Is not exercising   Oct. 12, 2020   [name] is seen today for follow up . Hx of previous CAD and previous coronary stenting .   Stanton was seen in the ER on Oct. 8 but left the waiting room because of prolonged wait time. Has mid  sternal chest pain . Seems worse with movement,  No radiation.  Did not have NTG to take   CP started a week ago .    Has occurred 3-4 times.  Woke up with CP last night Typically occurs at night while sleeping .  Does have CP while walking to the mailbox   Will last for several minutes.   Center of chest,  Radiates to his throat.  This pain is very similar to his previous episodes of CP  Pain was relieved with SL NTG   Not associated with deep breath , not associated with twisting his body .    December 23, 2018: [name] is seen today for a follow-up visit.  He was having some chest pain when I saw him last month and we referred him for heart catheterization.  Ramus lesion is 99% stenosed. Prox LAD to Mid LAD lesion is 70% stenosed. Dist Cx lesion is 60% stenosed. Mid RCA lesion is 95% stenosed. 3rd Mrg-1 lesion is 80% stenosed. 3rd Mrg-2 lesion is 99% stenosed. 1st LPL lesion is 99% stenosed.   1. Severe multi-vessel CAD 2. Moderately severe mid LAD stenosis 3. Severe restenosis of the stented segment in the proximal segment of the ramus intermediate branch 4. Severe restenosis  mid Circumflex/obtuse marginal stented segment. Severe stenosis in the first obtuse marginal branch beyond the stented segment. Severe stenosis distal Circumflex leading into the moderate caliber second obtuse marginal branch.  5. Severe stenosis mid RCA   Had coronary artery bypass grafting-LIMA to LAD, SVG to RI  - OM3, SVG to D1 Oct. 19, 2020  The distal right coronary artery/posterior descending branch was evaluated but was considered to be too small for bypass grafting. Is healing   He has had some recurrent pleural effusions and had a thoracentesis 2 days ago.   1.4 liters   The procedure was done in special procedures/interventional radiology on November 4.  He had some postoperative atrial fibrillation and is now on amiodarone  He was previously on amlodipine 2.5 mg a day, hydralazine 25 mg 3  times a day, isosorbide 30 mg a day and Eliquis.  These medications have been discontinued.  His simvastatin was discontinued but now he is on Crestor 20 mg a day.  January 25, 2019:  [name] is seen today for follow-up visit.  He has a history of coronary artery disease and is status post coronary artery bypass grafting ( Oct. 19,. 2020 )   He has a pacemaker. Breathing is going ok Slight DOE ,  No fever,    No angina    Feb. 17, 2023 [name] is seen today for follow up of his CAD, CABG Is on HD now for the past 1.5 years  occasional has hypotension with HD  Is developing peripheral neuropathy in his feet LDL is 16    Aug. 23, 2023  [name] is seen today  for CAD/ CABG  Has been on HD Having some CP / low level pain  with walking  Similar to his previous angina   He has chest discomfort/pressure walking from his car into the office.  We will schedule him for a cardiac PET scan. We will refer him for heart catheterization if the cardiac PET scan revealed significant ischemia.  Had moderate carotid stenosis of R ICA mild stenosis of L ICA  Will recheck cardotid duplex.   Sept. 6, 2023  [name] is seen today for recurrent CP / dyspnea  Has been getting dialysis for 2 years Is very anemic .  Hb is 7.5  Is getting iron , epo .   Takes midodrine for dialysis related hypotension  - doesn't work very well according   The plan is for him to get a transfusion - possibly at dialysis  If he is still symptomatic after getting his Hb up, then he will need a cardiac cath    Oct. 16, 2023   [name] is seen today  Still has some DOE .  Has had 2 transfusions - his last transfutsion was ( sept 7, then end of Sept.)  He symptoms of angina improved significantly with the transfusion.   He thinks he is having more symptoms over the past several days and suspects that he is becoming more anemic .   Nov. 18, 2024 [name] is seen for follow up of his CAD , CABG,  ESRD on HD   Has symptoms of  angina when he becomes anemic  Some episodes of cp  NTG has helped   Has had TAVR   has been seen by Carlean Jews  Breathing is better following TAVR  Still has some DOE   Had an ER visit in Sept. 2024  With chest pain  Does not do much work  Is unable  to walk   He had stopped taking his aspirin.  He recently restarted his aspirin and this seems to have helped the pain.  If he develops recurrent episodes of what sounds like cardiac chest pain will refer him for a Lexiscan Myoview study or perhaps a cardiac PET scan.       Current Outpatient Medications  Medication Sig Dispense Refill   amoxicillin (AMOXIL) 500 MG tablet Take 4 tablets (2,000 mg total) by mouth as directed. 1 hour prior to dental work including cleanings 12 tablet 12   aspirin EC 81 MG tablet Take 81 mg by mouth in the morning.     B Complex-C-Zn-Folic Acid (DIALYVITE 800 WITH ZINC) 0.8 MG TABS Take 1 tablet by mouth at bedtime.     CALCITRIOL PO Take 1 Dose by mouth Every Tuesday,Thursday,and Saturday with dialysis.     copper tablet Take 4 mg by mouth daily.     CVS VITAMIN B12 1000 MCG tablet TAKE 1 TABLET BY MOUTH EVERY DAY 90 tablet 0   diclofenac Sodium (VOLTAREN) 1 % GEL Apply 1 Application topically 4 (four) times daily as needed (neck pain).     diltiazem (CARDIZEM SR) 60 MG 12 hr capsule Take 1 capsule (60 mg total) by mouth as needed (palpitations). 30 capsule 3   gabapentin (NEURONTIN) 100 MG capsule Take 1 capsule (100 mg total) by mouth 3 (three) times daily. (Patient taking differently: Take 200 mg by mouth at bedtime.) 90 capsule 3   ibuprofen (ADVIL) 200 MG tablet Take 400 mg by mouth every 6 (six) hours as needed for moderate pain.     Methoxy PEG-Epoetin Beta (MIRCERA IJ) Mircera     Methoxy PEG-Epoetin Beta (MIRCERA IJ) Mircera     midodrine (PROAMATINE) 5 MG tablet Take 5 mg by mouth Every Tuesday,Thursday,and Saturday with dialysis.     nitroGLYCERIN (NITROSTAT) 0.4 MG SL tablet Place 1  tablet (0.4 mg total) under the tongue every 5 (five) minutes as needed for chest pain. 25 tablet 3   NOVOLOG MIX 70/30 FLEXPEN (70-30) 100 UNIT/ML FlexPen Inject 30 Units into the skin in the morning and at bedtime.  3   OVER THE COUNTER MEDICATION Take 1 tablet by mouth at bedtime as needed (sleep). Sleep-eze sleep aid     oxyCODONE-acetaminophen (PERCOCET/ROXICET) 5-325 MG tablet Take 1 tablet by mouth every 6 (six) hours as needed. 10 tablet 0   sevelamer carbonate (RENVELA) 800 MG tablet Take 1 tablet (800 mg total) by mouth 3 (three) times daily with meals. (Patient taking differently: Take 2,400 mg by mouth 3 (three) times daily with meals.) 90 tablet 3   traMADol (ULTRAM) 50 MG tablet Take 50 mg by mouth daily as needed for moderate pain.     No current facility-administered medications for this visit.     Allergies  Allergen Reactions   Shellfish Allergy Rash    Past Medical History:  Diagnosis Date   Anxiety    Atrial fibrillation (HCC)    Bell's palsy    left side of face droops   CAD (coronary artery disease)    s/p stent placement   Colon polyps    ESRD (end stage renal disease) (HCC)    [place]- [facility]- [facility]   Gout    History of blood transfusion    History of kidney stones    Passes   HLD (hyperlipidemia)    HTN (hypertension)    Insomnia    MI (myocardial infarction) (HCC) 1998  Neuropathy    NSTEMI (non-ST elevated myocardial infarction) (HCC)    s/p PCI in early 2000s   OSA on CPAP    wears CPAP   Presence of permanent cardiac pacemaker    S/P TAVR (transcatheter aortic valve replacement) 08/25/2022   s/p TAVR with a 26 mm Edwards S3UR via the TF approach by Dr. Lynnette Caffey & Dr. Laneta Simmers   Severe aortic stenosis    Type II or unspecified type diabetes mellitus without mention of complication, not stated as uncontrolled    Wears glasses    reading    Wears partial dentures     Past Surgical History:  Procedure Laterality Date   APPENDECTOMY      AV FISTULA PLACEMENT Right 07/28/2019   Procedure: RIGHT ARM Brachiocephalic ARTERIOVENOUS (AV) FISTULA CREATION;  Surgeon: Nada Libman, MD;  Location: [facility] OR;  Service: Vascular;  Laterality: Right;   CATARACT EXTRACTION     CLIPPING OF ATRIAL APPENDAGE  12/05/2018   Procedure: Clipping Of Atrial Appendage;  Surgeon: Linden Dolin, MD;  Location: [facility] OR;  Service: Open Heart Surgery;;   CORONARY ANGIOPLASTY WITH STENT PLACEMENT     x4   CORONARY ARTERY BYPASS GRAFT N/A 12/05/2018   Procedure: CORONARY ARTERY BYPASS GRAFTING (CABG) X FOUR USING LEFT INTERNAL MAMMARY ARTERY AND RIGHT SAPHENOUS VEIN GRAFTS;  Surgeon: Linden Dolin, MD;  Location: [facility] OR;  Service: Open Heart Surgery;  Laterality: N/A;   HERNIA REPAIR     ventral hernia repair with mesh--revision also was required   INSERT / REPLACE / REMOVE PACEMAKER  05/10/2000   pacemaker implanted in [place]]   INTRAOPERATIVE TRANSTHORACIC ECHOCARDIOGRAM N/A 08/25/2022   Procedure: INTRAOPERATIVE TRANSTHORACIC ECHOCARDIOGRAM;  Surgeon: Orbie Pyo, MD;  Location: [facility] INVASIVE CV LAB;  Service: Open Heart Surgery;  Laterality: N/A;   IR THORACENTESIS ASP PLEURAL SPACE W/IMG GUIDE  12/21/2018   IR THORACENTESIS ASP PLEURAL SPACE W/IMG GUIDE  12/30/2018   LEFT HEART CATH AND CORONARY ANGIOGRAPHY N/A 11/30/2018   Procedure: LEFT HEART CATH AND CORONARY ANGIOGRAPHY;  Surgeon: Kathleene Hazel, MD;  Location: [facility] INVASIVE CV LAB;  Service: Cardiovascular;  Laterality: N/A;   MAZE N/A 12/05/2018   Procedure: MAZE;  Surgeon: Linden Dolin, MD;  Location: [facility];  Service: Open Heart Surgery;  Laterality: N/A;   PACEMAKER GENERATOR CHANGE  09/03/09   MDT Adapta generator change in [place]   PARTIAL COLON REMOVAL  2/2   polyp that could not be removed on colonoscopy   PPM GENERATOR CHANGEOUT N/A 06/01/2019   Procedure: PPM GENERATOR CHANGEOUT;  Surgeon: Hillis Range, MD;  [facility] INVASIVE CV LAB;  Service: Cardiovascular;   Laterality: N/A;   REVISON OF ARTERIOVENOUS FISTULA Right 01/24/2020   Procedure: BANDING OF RIGHT ARM ARTERIOVENOUS FISTULA;  Surgeon: Nada Libman, MD;  Location: [facility];  Service: Vascular;  Laterality: Right;   RIGHT/LEFT HEART CATH AND CORONARY/GRAFT ANGIOGRAPHY N/A 07/31/2022   Procedure: RIGHT/LEFT HEART CATH AND CORONARY/GRAFT ANGIOGRAPHY;  Surgeon: Marykay Lex, MD;  Location[facility] INVASIVE CV LAB;  Service: Cardiovascular;  Laterality: N/A;   TEE WITHOUT CARDIOVERSION N/A 12/05/2018   Procedure: TRANSESOPHAGEAL ECHOCARDIOGRAM (TEE);  Surgeon: Linden Dolin, MD;  Location: [facility] OR;  Service: Open Heart Surgery;  Laterality: N/A;   TRANSCATHETER AORTIC VALVE REPLACEMENT, TRANSFEMORAL N/A 08/25/2022   Procedure: Transcatheter Aortic Valve Replacement, Transfemoral;  Surgeon: Orbie Pyo, MD;  [facility]: [facility] INVASIVE CV LAB;  Service: Open Heart Surgery;  Laterality: N/A;  Social History   Tobacco Use  Smoking Status Former   Types: Cigarettes  Smokeless Tobacco Never  Tobacco Comments   " many years ago"    Social History   Substance and Sexual Activity  Alcohol Use No    Family History  Problem Relation Age of Onset   Heart attack Father 39   Heart disease Father    Breast cancer Sister    Pulmonary embolism Sister    Deep vein thrombosis Sister     Reviw of Systems:  Reviewed in the HPI.  All other systems are negative.   Physical Exam: Blood pressure (!) 140/65, pulse (!) 54, height 5\' 11"  (1.803 m), weight 223 lb (101.2 kg), SpO2 96%.  HYPERTENSION CONTROL Vitals:   01/04/23 1500 01/04/23 1514  BP: (!) 168/70 (!) 140/65    The patient's blood pressure is elevated above target today.  In order to address the patient's elevated BP: Blood pressure will be monitored at home to determine if medication changes need to be made.       GEN:  elderly male,   in no acute distress HEENT: Normal NECK: No JVD;  left carotid bruit  LYMPHATICS: No  lymphadenopathy CARDIAC: RRR , no murmurs, rubs, gallops RESPIRATORY:  Clear to auscultation without rales, wheezing or rhonchi  ABDOMEN: Soft, non-tender, non-distended MUSCULOSKELETAL:  No edema; No deformity  SKIN: Warm and dry NEUROLOGIC:  Alert and oriented x 3    ECG:   Assessment / Plan:   1.  Coronary artery disease: Had a heart catheterization which revealed severe three-vessel coronary artery disease.  He had coronary artery bypass grafting to his LAD, circumflex artery, diagonal vessel.  The right coronary artery was too small to consider bypassing.  He continues to have some episodes of chest discomfort.  He went to the emergency room in September.  His troponins are chronically elevated but were no different than his normal baseline.  We discussed whether or not he needs to have a follow-up Myoview study or cardiac PET scan.  He had stopped taking his aspirin and recently restarted.  He thinks the pains might have improved after starting the aspirin.  Will have him continue aspirin.  His wife will give [place] a call back if these chest pains worsen.  I would consider doing a cardiac PET or perhaps a Myoview study if he has worsening episodes of chest discomfort.  Have given him the standard emergency room precautions to head to the hospital if his chest pain does not resolve with nitroglycerin.     2.  Chronic diastolic congestive heart failure: :    volume is managed by nephrology   3.  Hypertension:     managed by nephrology   4.  ESRD :      5.  Paroxysmal atrial fibrillation:        6.  Carotid artery disease:     Kristeen Miss, MD  01/04/2023 4:07 PM    [facility] [address] [place],  [address] [place], [place]  [address] Pager [phone] Phone: [phone]; Fax: [phone]   .

## 2023-01-04 ENCOUNTER — Ambulatory Visit: Payer: Medicare Other | Attending: Cardiovascular Disease | Admitting: Cardiovascular Disease

## 2023-01-04 ENCOUNTER — Encounter: Payer: Self-pay | Admitting: Cardiovascular Disease

## 2023-01-04 VITALS — BP 140/65 | HR 54 | Ht 71.0 in | Wt 223.0 lb

## 2023-01-04 DIAGNOSIS — Z951 Presence of aortocoronary bypass graft: Secondary | ICD-10-CM | POA: Diagnosis not present

## 2023-01-04 DIAGNOSIS — Z952 Presence of prosthetic heart valve: Secondary | ICD-10-CM | POA: Diagnosis not present

## 2023-01-04 DIAGNOSIS — I10 Essential (primary) hypertension: Secondary | ICD-10-CM | POA: Diagnosis not present

## 2023-01-04 NOTE — Patient Instructions (Signed)
Medication Instructions:  Your physician recommends that you continue on your current medications as directed. Please refer to the Current Medication list given to you today.  *If you need a refill on your cardiac medications before your next appointment, please call your pharmacy*  Lab Work: If you have labs (blood work) drawn today and your tests are completely normal, you will receive your results only by: MyChart Message (if you have MyChart) OR A paper copy in the mail If you have any lab test that is abnormal or we need to change your treatment, we will call you to review the results.  Testing/Procedures: None ordered today.  Follow-Up: At [facility], you and your health needs are our priority.  As part of our continuing mission to provide you with exceptional heart care, we have created designated Provider Care Teams.  These Care Teams include your primary Cardiologist (physician) and Advanced Practice Providers (APPs -  Physician Assistants and Nurse Practitioners) who all work together to provide you with the care you need, when you need it.  We recommend signing up for the patient portal called "MyChart".  Sign up information is provided on this After Visit Summary.  MyChart is used to connect with patients for Virtual Visits (Telemedicine).  Patients are able to view lab/test results, encounter notes, upcoming appointments, etc.  Non-urgent messages can be sent to your provider as well.   To learn more about what you can do with MyChart, go to [email or web address].    Your next appointment:   3 month(s)  Provider:   Kristeen Miss, MD

## 2023-01-05 DIAGNOSIS — D631 Anemia in chronic kidney disease: Secondary | ICD-10-CM | POA: Diagnosis not present

## 2023-01-05 DIAGNOSIS — E1122 Type 2 diabetes mellitus with diabetic chronic kidney disease: Secondary | ICD-10-CM | POA: Diagnosis not present

## 2023-01-05 DIAGNOSIS — Z992 Dependence on renal dialysis: Secondary | ICD-10-CM | POA: Diagnosis not present

## 2023-01-05 DIAGNOSIS — D509 Iron deficiency anemia, unspecified: Secondary | ICD-10-CM | POA: Diagnosis not present

## 2023-01-05 DIAGNOSIS — N186 End stage renal disease: Secondary | ICD-10-CM | POA: Diagnosis not present

## 2023-01-05 DIAGNOSIS — N2581 Secondary hyperparathyroidism of renal origin: Secondary | ICD-10-CM | POA: Diagnosis not present

## 2023-01-06 NOTE — Addendum Note (Signed)
Addended by: Vesta Mixer on: 01/06/2023 09:33 PM   Modules accepted: Orders

## 2023-01-07 DIAGNOSIS — D631 Anemia in chronic kidney disease: Secondary | ICD-10-CM | POA: Diagnosis not present

## 2023-01-07 DIAGNOSIS — N186 End stage renal disease: Secondary | ICD-10-CM | POA: Diagnosis not present

## 2023-01-07 DIAGNOSIS — E1122 Type 2 diabetes mellitus with diabetic chronic kidney disease: Secondary | ICD-10-CM | POA: Diagnosis not present

## 2023-01-07 DIAGNOSIS — N2581 Secondary hyperparathyroidism of renal origin: Secondary | ICD-10-CM | POA: Diagnosis not present

## 2023-01-07 DIAGNOSIS — E039 Hypothyroidism, unspecified: Secondary | ICD-10-CM | POA: Diagnosis not present

## 2023-01-07 DIAGNOSIS — D509 Iron deficiency anemia, unspecified: Secondary | ICD-10-CM | POA: Diagnosis not present

## 2023-01-07 DIAGNOSIS — Z992 Dependence on renal dialysis: Secondary | ICD-10-CM | POA: Diagnosis not present

## 2023-01-09 DIAGNOSIS — Z992 Dependence on renal dialysis: Secondary | ICD-10-CM | POA: Diagnosis not present

## 2023-01-09 DIAGNOSIS — D631 Anemia in chronic kidney disease: Secondary | ICD-10-CM | POA: Diagnosis not present

## 2023-01-09 DIAGNOSIS — N186 End stage renal disease: Secondary | ICD-10-CM | POA: Diagnosis not present

## 2023-01-09 DIAGNOSIS — D509 Iron deficiency anemia, unspecified: Secondary | ICD-10-CM | POA: Diagnosis not present

## 2023-01-09 DIAGNOSIS — E1122 Type 2 diabetes mellitus with diabetic chronic kidney disease: Secondary | ICD-10-CM | POA: Diagnosis not present

## 2023-01-09 DIAGNOSIS — N2581 Secondary hyperparathyroidism of renal origin: Secondary | ICD-10-CM | POA: Diagnosis not present

## 2023-01-11 ENCOUNTER — Ambulatory Visit: Payer: Medicare Other | Admitting: Cardiovascular Disease

## 2023-01-11 DIAGNOSIS — N2581 Secondary hyperparathyroidism of renal origin: Secondary | ICD-10-CM | POA: Diagnosis not present

## 2023-01-11 DIAGNOSIS — E1122 Type 2 diabetes mellitus with diabetic chronic kidney disease: Secondary | ICD-10-CM | POA: Diagnosis not present

## 2023-01-11 DIAGNOSIS — D631 Anemia in chronic kidney disease: Secondary | ICD-10-CM | POA: Diagnosis not present

## 2023-01-11 DIAGNOSIS — N186 End stage renal disease: Secondary | ICD-10-CM | POA: Diagnosis not present

## 2023-01-11 DIAGNOSIS — Z992 Dependence on renal dialysis: Secondary | ICD-10-CM | POA: Diagnosis not present

## 2023-01-11 DIAGNOSIS — D509 Iron deficiency anemia, unspecified: Secondary | ICD-10-CM | POA: Diagnosis not present

## 2023-01-13 DIAGNOSIS — N2581 Secondary hyperparathyroidism of renal origin: Secondary | ICD-10-CM | POA: Diagnosis not present

## 2023-01-13 DIAGNOSIS — D509 Iron deficiency anemia, unspecified: Secondary | ICD-10-CM | POA: Diagnosis not present

## 2023-01-13 DIAGNOSIS — N186 End stage renal disease: Secondary | ICD-10-CM | POA: Diagnosis not present

## 2023-01-13 DIAGNOSIS — E1122 Type 2 diabetes mellitus with diabetic chronic kidney disease: Secondary | ICD-10-CM | POA: Diagnosis not present

## 2023-01-13 DIAGNOSIS — Z992 Dependence on renal dialysis: Secondary | ICD-10-CM | POA: Diagnosis not present

## 2023-01-13 DIAGNOSIS — D631 Anemia in chronic kidney disease: Secondary | ICD-10-CM | POA: Diagnosis not present

## 2023-01-16 DIAGNOSIS — N2581 Secondary hyperparathyroidism of renal origin: Secondary | ICD-10-CM | POA: Diagnosis not present

## 2023-01-16 DIAGNOSIS — N186 End stage renal disease: Secondary | ICD-10-CM | POA: Diagnosis not present

## 2023-01-16 DIAGNOSIS — D631 Anemia in chronic kidney disease: Secondary | ICD-10-CM | POA: Diagnosis not present

## 2023-01-16 DIAGNOSIS — D509 Iron deficiency anemia, unspecified: Secondary | ICD-10-CM | POA: Diagnosis not present

## 2023-01-16 DIAGNOSIS — Z992 Dependence on renal dialysis: Secondary | ICD-10-CM | POA: Diagnosis not present

## 2023-01-16 DIAGNOSIS — E1122 Type 2 diabetes mellitus with diabetic chronic kidney disease: Secondary | ICD-10-CM | POA: Diagnosis not present

## 2023-01-17 DIAGNOSIS — I129 Hypertensive chronic kidney disease with stage 1 through stage 4 chronic kidney disease, or unspecified chronic kidney disease: Secondary | ICD-10-CM | POA: Diagnosis not present

## 2023-01-17 DIAGNOSIS — Z992 Dependence on renal dialysis: Secondary | ICD-10-CM | POA: Diagnosis not present

## 2023-01-17 DIAGNOSIS — N186 End stage renal disease: Secondary | ICD-10-CM | POA: Diagnosis not present

## 2023-01-19 ENCOUNTER — Telehealth: Payer: Self-pay | Admitting: Cardiovascular Disease

## 2023-01-19 DIAGNOSIS — Z01810 Encounter for preprocedural cardiovascular examination: Secondary | ICD-10-CM

## 2023-01-19 DIAGNOSIS — D631 Anemia in chronic kidney disease: Secondary | ICD-10-CM | POA: Diagnosis not present

## 2023-01-19 DIAGNOSIS — Z992 Dependence on renal dialysis: Secondary | ICD-10-CM | POA: Diagnosis not present

## 2023-01-19 DIAGNOSIS — N186 End stage renal disease: Secondary | ICD-10-CM | POA: Diagnosis not present

## 2023-01-19 DIAGNOSIS — E1122 Type 2 diabetes mellitus with diabetic chronic kidney disease: Secondary | ICD-10-CM | POA: Diagnosis not present

## 2023-01-19 DIAGNOSIS — N2581 Secondary hyperparathyroidism of renal origin: Secondary | ICD-10-CM | POA: Diagnosis not present

## 2023-01-19 DIAGNOSIS — D509 Iron deficiency anemia, unspecified: Secondary | ICD-10-CM | POA: Diagnosis not present

## 2023-01-19 NOTE — Telephone Encounter (Signed)
Call sent straight to triage. Patient's wife, DPR, complaining of patient having chest pain for a while now. She stated that patient's nitroglycerin does help, but does not last. She stated that Dr. Elease Hashimoto had suggested getting stress test if symptoms came back. She would like patient to get test done. Informed her that a message would be sent to Dr. Elease Hashimoto to get an order, and our office will call her back with his advisement. She stated patient is unable to do a exercise stress test, due to leg issues. Encouraged patient's wife to call 911 if nitroglycerin does not help with chest pain or patient is having to take second dose. Patient is currently at dialysis. Patient's wife stated she does not want to take patient to ED due to wait times. Wife is not currently with husband.

## 2023-01-19 NOTE — Telephone Encounter (Signed)
Nahser, Deloris Ping, MD  Ethelda Chick, RN2 hours ago (3:03 PM)   The notes suggest that he is having CP now. If he is having CP and the NTG is not working, he would go to the ER  Thanks  PN   Spoke with Dr Elease Hashimoto who states we cannot stress someone with active chest pain. Called and spoke with wife about this and explained that he should go to ED. Wife refuses. Asks what they will do there-advised likely a cath. She wants to know why Dr Elease Hashimoto can't order this now as he has in the past. Advised will speak with nahser.

## 2023-01-19 NOTE — Telephone Encounter (Signed)
   Pt c/o of Chest Pain: STAT if active CP, including tightness, pressure, jaw pain, radiating pain to shoulder/upper arm/back, CP unrelieved by Nitro. Symptoms reported of SOB, nausea, vomiting, sweating.  1. Are you having CP right now? Yes     2. Are you experiencing any other symptoms (ex. SOB, nausea, vomiting, sweating)? SOB, lightheadedness, radiating pain to shoulder/upper arm/back   3. Is your CP continuous or coming and going? continuous   4. Have you taken Nitroglycerin? Yes    5. How long have you been experiencing CP? Several days    6. If NO CP at time of call then end call with telling Pt to call back or call 911 if Chest pain returns prior to return call from triage team.

## 2023-01-20 ENCOUNTER — Other Ambulatory Visit: Payer: Self-pay

## 2023-01-20 ENCOUNTER — Other Ambulatory Visit: Payer: Self-pay | Admitting: Cardiovascular Disease

## 2023-01-20 ENCOUNTER — Ambulatory Visit: Payer: Medicare Other | Attending: Internal Medicine

## 2023-01-20 VITALS — HR 90 | Wt 220.0 lb

## 2023-01-20 DIAGNOSIS — Z01810 Encounter for preprocedural cardiovascular examination: Secondary | ICD-10-CM | POA: Insufficient documentation

## 2023-01-20 NOTE — Progress Notes (Signed)
   Nurse Visit   Date of Encounter: 01/20/2023 ID: Bradley Soto, DOB [birth year], MRN [id number]  PCP:  Alysia Penna, MD   [facility] Providers Cardiologist:  Kristeen Miss, MD Electrophysiologist:  Maurice Small, MD      Visit Details   VS:  Pulse 90   Wt 220 lb (99.8 kg)   BMI 30.68 kg/m  , BMI Body mass index is 30.68 kg/m.  Wt Readings from Last 3 Encounters:  01/20/23 220 lb (99.8 kg)  01/04/23 223 lb (101.2 kg)  10/23/22 216 lb 7.9 oz (98.2 kg)     Reason for visit: EKG Performed today: Vitals, EKG, Provider consulted:Dr. Graciela Husbands, and Education Changes (medications, testing, etc.) : None Length of Visit: 10 minutes    Medications Adjustments/Labs and Tests Ordered: Orders Placed This Encounter  Procedures   EKG 12-Lead   EKG 12-Lead   No orders of the defined types were placed in this encounter.    Signed, Shakenya Stoneberg Merilynn Finland, LPN  09/22/5782 6:96 PM

## 2023-01-21 DIAGNOSIS — N186 End stage renal disease: Secondary | ICD-10-CM | POA: Diagnosis not present

## 2023-01-21 DIAGNOSIS — E1122 Type 2 diabetes mellitus with diabetic chronic kidney disease: Secondary | ICD-10-CM | POA: Diagnosis not present

## 2023-01-21 DIAGNOSIS — D509 Iron deficiency anemia, unspecified: Secondary | ICD-10-CM | POA: Diagnosis not present

## 2023-01-21 DIAGNOSIS — Z992 Dependence on renal dialysis: Secondary | ICD-10-CM | POA: Diagnosis not present

## 2023-01-21 DIAGNOSIS — N2581 Secondary hyperparathyroidism of renal origin: Secondary | ICD-10-CM | POA: Diagnosis not present

## 2023-01-21 DIAGNOSIS — D631 Anemia in chronic kidney disease: Secondary | ICD-10-CM | POA: Diagnosis not present

## 2023-01-21 LAB — BASIC METABOLIC PANEL
BUN/Creatinine Ratio: 8 — ABNORMAL LOW (ref 10–24)
BUN: 54 mg/dL — ABNORMAL HIGH (ref 8–27)
CO2: 24 mmol/L (ref 20–29)
Calcium: 9.7 mg/dL (ref 8.6–10.2)
Chloride: 97 mmol/L (ref 96–106)
Creatinine, Ser: 7.15 mg/dL — ABNORMAL HIGH (ref 0.76–1.27)
Glucose: 135 mg/dL — ABNORMAL HIGH (ref 70–99)
Potassium: 5.7 mmol/L — ABNORMAL HIGH (ref 3.5–5.2)
Sodium: 141 mmol/L (ref 134–144)
eGFR: 7 mL/min/{1.73_m2} — ABNORMAL LOW (ref >59)

## 2023-01-21 LAB — CBC
Hematocrit: 33.3 % — ABNORMAL LOW (ref 37.5–51.0)
Hemoglobin: 10.9 g/dL — ABNORMAL LOW (ref 13.0–17.7)
MCH: 33.4 pg — ABNORMAL HIGH (ref 26.6–33.0)
MCHC: 32.7 g/dL (ref 31.5–35.7)
MCV: 102 fL — ABNORMAL HIGH (ref 79–97)
Platelets: 103 10*3/uL — ABNORMAL LOW (ref 150–450)
RBC: 3.26 x10E6/uL — ABNORMAL LOW (ref 4.14–5.80)
RDW: 14.8 % (ref 11.6–15.4)
WBC: 11.1 10*3/uL — ABNORMAL HIGH (ref 3.4–10.8)

## 2023-01-22 NOTE — Telephone Encounter (Signed)
Called and spoke with wife. Pt set up for cath on 12/11 w/[facility]. Directions reviewed on phone and sent via MyChart.

## 2023-01-23 DIAGNOSIS — D509 Iron deficiency anemia, unspecified: Secondary | ICD-10-CM | POA: Diagnosis not present

## 2023-01-23 DIAGNOSIS — N2581 Secondary hyperparathyroidism of renal origin: Secondary | ICD-10-CM | POA: Diagnosis not present

## 2023-01-23 DIAGNOSIS — N186 End stage renal disease: Secondary | ICD-10-CM | POA: Diagnosis not present

## 2023-01-23 DIAGNOSIS — D631 Anemia in chronic kidney disease: Secondary | ICD-10-CM | POA: Diagnosis not present

## 2023-01-23 DIAGNOSIS — Z992 Dependence on renal dialysis: Secondary | ICD-10-CM | POA: Diagnosis not present

## 2023-01-23 DIAGNOSIS — E1122 Type 2 diabetes mellitus with diabetic chronic kidney disease: Secondary | ICD-10-CM | POA: Diagnosis not present

## 2023-01-25 ENCOUNTER — Telehealth: Payer: Self-pay | Admitting: Cardiology

## 2023-01-25 NOTE — Telephone Encounter (Signed)
Left message for pt to call.

## 2023-01-25 NOTE — Telephone Encounter (Signed)
Patient's wife would like a call back to review 12/11 cath instructions again. She would like to know which medications patient can take. She also mentions that he is a diabetic and she would like to know if he can be given an IV.

## 2023-01-25 NOTE — Telephone Encounter (Signed)
Spoke with pt wife, she is concerned because he is on insulin and she is afraid his blood sugar will drop. Patient instructed to take 1/2 of the evening dose of insulin the night before the procedure and none the morning of the procedure. All questions answered.

## 2023-01-26 ENCOUNTER — Telehealth: Payer: Self-pay | Admitting: *Deleted

## 2023-01-26 DIAGNOSIS — N186 End stage renal disease: Secondary | ICD-10-CM | POA: Diagnosis not present

## 2023-01-26 DIAGNOSIS — N2581 Secondary hyperparathyroidism of renal origin: Secondary | ICD-10-CM | POA: Diagnosis not present

## 2023-01-26 DIAGNOSIS — E1122 Type 2 diabetes mellitus with diabetic chronic kidney disease: Secondary | ICD-10-CM | POA: Diagnosis not present

## 2023-01-26 DIAGNOSIS — Z992 Dependence on renal dialysis: Secondary | ICD-10-CM | POA: Diagnosis not present

## 2023-01-26 DIAGNOSIS — D509 Iron deficiency anemia, unspecified: Secondary | ICD-10-CM | POA: Diagnosis not present

## 2023-01-26 DIAGNOSIS — D631 Anemia in chronic kidney disease: Secondary | ICD-10-CM | POA: Diagnosis not present

## 2023-01-26 NOTE — Telephone Encounter (Signed)
Cardiac Catheterization scheduled at [facility] for: Wednesday January 27, 2023 1 PM Arrival time [facility] Main Entrance A at: 11 AM  Nothing to eat after midnight prior to procedure, clear liquids until 5 AM day of procedure.  Medication instructions: -Hold:  Insulin-AM of procedure 1/2 usual Insulin dose HS prior to procedure -Other usual morning medications can be taken with sips of water including aspirin 81 mg.  Plan to go home the same day, you will only stay overnight if medically necessary.  You must have responsible adult to drive you home.  Someone must be with you the first 24 hours after you arrive home.  Reviewed procedure instructions with patient's wife, Huntley Dec.  TTS dialysis schedule.

## 2023-01-27 ENCOUNTER — Encounter (HOSPITAL_COMMUNITY)
Admission: RE | Disposition: A | Discharge status: Home / Self Care | Payer: Self-pay | Source: Home / Self Care | Attending: Cardiology

## 2023-01-27 ENCOUNTER — Other Ambulatory Visit: Payer: Self-pay

## 2023-01-27 ENCOUNTER — Ambulatory Visit (HOSPITAL_COMMUNITY)
Admission: RE | Admit: 2023-01-27 | Discharge: 2023-01-28 | Disposition: A | Discharge status: Home / Self Care | Payer: Medicare Other | Attending: Cardiology | Admitting: Cardiology

## 2023-01-27 DIAGNOSIS — I2584 Coronary atherosclerosis due to calcified coronary lesion: Secondary | ICD-10-CM | POA: Insufficient documentation

## 2023-01-27 DIAGNOSIS — E1142 Type 2 diabetes mellitus with diabetic polyneuropathy: Secondary | ICD-10-CM | POA: Diagnosis not present

## 2023-01-27 DIAGNOSIS — I25110 Atherosclerotic heart disease of native coronary artery with unstable angina pectoris: Secondary | ICD-10-CM | POA: Diagnosis not present

## 2023-01-27 DIAGNOSIS — Z953 Presence of xenogenic heart valve: Secondary | ICD-10-CM | POA: Diagnosis not present

## 2023-01-27 DIAGNOSIS — Z951 Presence of aortocoronary bypass graft: Secondary | ICD-10-CM | POA: Insufficient documentation

## 2023-01-27 DIAGNOSIS — I200 Unstable angina: Secondary | ICD-10-CM | POA: Diagnosis present

## 2023-01-27 DIAGNOSIS — T82858A Stenosis of vascular prosthetic devices, implants and grafts, initial encounter: Secondary | ICD-10-CM | POA: Diagnosis not present

## 2023-01-27 DIAGNOSIS — Z992 Dependence on renal dialysis: Secondary | ICD-10-CM | POA: Diagnosis not present

## 2023-01-27 DIAGNOSIS — I495 Sick sinus syndrome: Secondary | ICD-10-CM | POA: Insufficient documentation

## 2023-01-27 DIAGNOSIS — I480 Paroxysmal atrial fibrillation: Secondary | ICD-10-CM | POA: Diagnosis not present

## 2023-01-27 DIAGNOSIS — N186 End stage renal disease: Secondary | ICD-10-CM | POA: Diagnosis not present

## 2023-01-27 DIAGNOSIS — I779 Disorder of arteries and arterioles, unspecified: Secondary | ICD-10-CM | POA: Insufficient documentation

## 2023-01-27 DIAGNOSIS — Z7902 Long term (current) use of antithrombotics/antiplatelets: Secondary | ICD-10-CM | POA: Diagnosis not present

## 2023-01-27 DIAGNOSIS — I132 Hypertensive heart and chronic kidney disease with heart failure and with stage 5 chronic kidney disease, or end stage renal disease: Secondary | ICD-10-CM | POA: Diagnosis not present

## 2023-01-27 DIAGNOSIS — Z87891 Personal history of nicotine dependence: Secondary | ICD-10-CM | POA: Diagnosis not present

## 2023-01-27 DIAGNOSIS — I5032 Chronic diastolic (congestive) heart failure: Secondary | ICD-10-CM | POA: Insufficient documentation

## 2023-01-27 DIAGNOSIS — I25710 Atherosclerosis of autologous vein coronary artery bypass graft(s) with unstable angina pectoris: Secondary | ICD-10-CM

## 2023-01-27 DIAGNOSIS — Z950 Presence of cardiac pacemaker: Secondary | ICD-10-CM | POA: Insufficient documentation

## 2023-01-27 DIAGNOSIS — I2582 Chronic total occlusion of coronary artery: Secondary | ICD-10-CM | POA: Insufficient documentation

## 2023-01-27 DIAGNOSIS — Z952 Presence of prosthetic heart valve: Secondary | ICD-10-CM | POA: Insufficient documentation

## 2023-01-27 DIAGNOSIS — I25720 Atherosclerosis of autologous artery coronary artery bypass graft(s) with unstable angina pectoris: Secondary | ICD-10-CM | POA: Diagnosis not present

## 2023-01-27 DIAGNOSIS — Z7982 Long term (current) use of aspirin: Secondary | ICD-10-CM | POA: Diagnosis not present

## 2023-01-27 DIAGNOSIS — Z955 Presence of coronary angioplasty implant and graft: Secondary | ICD-10-CM | POA: Insufficient documentation

## 2023-01-27 DIAGNOSIS — Z8249 Family history of ischemic heart disease and other diseases of the circulatory system: Secondary | ICD-10-CM | POA: Diagnosis not present

## 2023-01-27 DIAGNOSIS — E1122 Type 2 diabetes mellitus with diabetic chronic kidney disease: Secondary | ICD-10-CM | POA: Insufficient documentation

## 2023-01-27 HISTORY — PX: CORONARY STENT INTERVENTION: CATH118234

## 2023-01-27 HISTORY — PX: LEFT HEART CATH AND CORS/GRAFTS ANGIOGRAPHY: CATH118250

## 2023-01-27 LAB — BASIC METABOLIC PANEL
Anion gap: 19 — ABNORMAL HIGH (ref 5–15)
BUN: 61 mg/dL — ABNORMAL HIGH (ref 8–23)
CO2: 24 mmol/L (ref 22–32)
Calcium: 9.9 mg/dL (ref 8.9–10.3)
Chloride: 97 mmol/L — ABNORMAL LOW (ref 98–111)
Creatinine, Ser: 7.29 mg/dL — ABNORMAL HIGH (ref 0.61–1.24)
GFR, Estimated: 7 mL/min — ABNORMAL LOW (ref >60)
Glucose, Bld: 130 mg/dL — ABNORMAL HIGH (ref 70–99)
Potassium: 4.8 mmol/L (ref 3.5–5.1)
Sodium: 140 mmol/L (ref 135–145)

## 2023-01-27 LAB — CBC
HCT: 32 % — ABNORMAL LOW (ref 39.0–52.0)
Hemoglobin: 10 g/dL — ABNORMAL LOW (ref 13.0–17.0)
MCH: 32.9 pg (ref 26.0–34.0)
MCHC: 31.3 g/dL (ref 30.0–36.0)
MCV: 105.3 fL — ABNORMAL HIGH (ref 80.0–100.0)
Platelets: 184 10*3/uL (ref 150–400)
RBC: 3.04 MIL/uL — ABNORMAL LOW (ref 4.22–5.81)
RDW: 15.9 % — ABNORMAL HIGH (ref 11.5–15.5)
WBC: 9.7 10*3/uL (ref 4.0–10.5)
nRBC: 0 % (ref 0.0–0.2)

## 2023-01-27 LAB — POCT ACTIVATED CLOTTING TIME
Activated Clotting Time: 256 s
Activated Clotting Time: 268 s
Activated Clotting Time: 291 s
Activated Clotting Time: 233 s
Activated Clotting Time: 187 s
Activated Clotting Time: 170 s

## 2023-01-27 LAB — GLUCOSE, CAPILLARY
Glucose-Capillary: 121 mg/dL — ABNORMAL HIGH (ref 70–99)
Glucose-Capillary: 167 mg/dL — ABNORMAL HIGH (ref 70–99)

## 2023-01-27 SURGERY — LEFT HEART CATH AND CORS/GRAFTS ANGIOGRAPHY
Anesthesia: LOCAL

## 2023-01-27 MED ORDER — COPPER GLYCINATE POWD: 2.0000 | Freq: Every morning | Status: DC

## 2023-01-27 MED ORDER — GABAPENTIN 300 MG PO CAPS
300.0000 mg | ORAL_CAPSULE | Freq: Two times a day (BID) | ORAL | Status: DC
  Administered 2023-01-27 – 2023-01-28 (×2): 300 mg via ORAL
  Filled 2023-01-27 (×2): qty 1

## 2023-01-27 MED ORDER — SODIUM CHLORIDE 0.9% FLUSH
3.0000 mL | Freq: Two times a day (BID) | INTRAVENOUS | Status: DC
  Administered 2023-01-27 – 2023-01-28 (×2): 3 mL via INTRAVENOUS

## 2023-01-27 MED ORDER — CLOPIDOGREL BISULFATE 300 MG PO TABS
ORAL_TABLET | ORAL | Status: DC | PRN
  Administered 2023-01-27: 600 mg via ORAL

## 2023-01-27 MED ORDER — MIDAZOLAM HCL 2 MG/2ML IJ SOLN
INTRAMUSCULAR | Status: AC
  Filled 2023-01-27: qty 2

## 2023-01-27 MED ORDER — DIPHENHYDRAMINE HCL 25 MG PO CAPS
25.0000 mg | ORAL_CAPSULE | Freq: Every day | ORAL | Status: DC
  Administered 2023-01-27: 25 mg via ORAL
  Filled 2023-01-27: qty 1

## 2023-01-27 MED ORDER — NITROGLYCERIN 1 MG/10 ML FOR IR/CATH LAB
INTRA_ARTERIAL | Status: DC | PRN
  Administered 2023-01-27 (×2): 200 ug via INTRACORONARY

## 2023-01-27 MED ORDER — ASPIRIN 81 MG PO TBEC
81.0000 mg | DELAYED_RELEASE_TABLET | Freq: Every morning | ORAL | Status: DC
  Administered 2023-01-28: 81 mg via ORAL
  Filled 2023-01-27: qty 1

## 2023-01-27 MED ORDER — SODIUM CHLORIDE 0.9 % IV SOLN: 250.0000 mL | INTRAVENOUS | Status: DC | PRN

## 2023-01-27 MED ORDER — FAMOTIDINE IN NACL 20-0.9 MG/50ML-% IV SOLN
INTRAVENOUS | Status: AC | PRN
  Administered 2023-01-27: 20 mg via INTRAVENOUS

## 2023-01-27 MED ORDER — CLOPIDOGREL BISULFATE 75 MG PO TABS
75.0000 mg | ORAL_TABLET | Freq: Every day | ORAL | Status: DC
  Administered 2023-01-28: 75 mg via ORAL
  Filled 2023-01-27: qty 1

## 2023-01-27 MED ORDER — HEPARIN SODIUM (PORCINE) 1000 UNIT/ML IJ SOLN
INTRAMUSCULAR | Status: DC | PRN
  Administered 2023-01-27: 2000 [IU] via INTRAVENOUS
  Administered 2023-01-27: 3000 [IU] via INTRAVENOUS
  Administered 2023-01-27: 9000 [IU] via INTRAVENOUS

## 2023-01-27 MED ORDER — NITROGLYCERIN 0.4 MG SL SUBL: 0.4000 mg | SUBLINGUAL_TABLET | SUBLINGUAL | Status: DC | PRN

## 2023-01-27 MED ORDER — DIPHENHYDRAMINE-APAP (SLEEP) 25-500 MG PO TABS: 1.0000 | ORAL_TABLET | Freq: Every day | ORAL | Status: DC

## 2023-01-27 MED ORDER — ONDANSETRON HCL 4 MG/2ML IJ SOLN: 4.0000 mg | Freq: Four times a day (QID) | INTRAMUSCULAR | Status: DC | PRN

## 2023-01-27 MED ORDER — VERAPAMIL HCL 2.5 MG/ML IV SOLN
INTRAVENOUS | Status: AC
  Filled 2023-01-27: qty 2

## 2023-01-27 MED ORDER — FENTANYL CITRATE (PF) 100 MCG/2ML IJ SOLN
INTRAMUSCULAR | Status: DC | PRN
  Administered 2023-01-27: 25 ug
  Administered 2023-01-27: 25 ug via INTRAVENOUS
  Administered 2023-01-27: 25 ug

## 2023-01-27 MED ORDER — LABETALOL HCL 5 MG/ML IV SOLN: 10.0000 mg | INTRAVENOUS | Status: AC | PRN

## 2023-01-27 MED ORDER — ACETAMINOPHEN 325 MG PO TABS: 650.0000 mg | ORAL_TABLET | ORAL | Status: DC | PRN

## 2023-01-27 MED ORDER — FENTANYL CITRATE (PF) 100 MCG/2ML IJ SOLN
INTRAMUSCULAR | Status: AC
  Filled 2023-01-27: qty 2

## 2023-01-27 MED ORDER — HEPARIN (PORCINE) IN NACL 1000-0.9 UT/500ML-% IV SOLN
INTRAVENOUS | Status: DC | PRN
  Administered 2023-01-27 (×3): 500 mL

## 2023-01-27 MED ORDER — FAMOTIDINE IN NACL 20-0.9 MG/50ML-% IV SOLN
INTRAVENOUS | Status: AC
  Filled 2023-01-27: qty 50

## 2023-01-27 MED ORDER — LIDOCAINE HCL (PF) 1 % IJ SOLN
INTRAMUSCULAR | Status: AC
  Filled 2023-01-27: qty 30

## 2023-01-27 MED ORDER — IBUPROFEN 200 MG PO TABS
400.0000 mg | ORAL_TABLET | Freq: Four times a day (QID) | ORAL | Status: DC | PRN
  Administered 2023-01-27: 400 mg via ORAL
  Filled 2023-01-27: qty 2

## 2023-01-27 MED ORDER — SODIUM CHLORIDE 0.9 % IV SOLN: INTRAVENOUS | Status: DC

## 2023-01-27 MED ORDER — NITROGLYCERIN 1 MG/10 ML FOR IR/CATH LAB
INTRA_ARTERIAL | Status: AC
  Filled 2023-01-27: qty 10

## 2023-01-27 MED ORDER — OXYCODONE HCL 5 MG PO TABS
5.0000 mg | ORAL_TABLET | Freq: Once | ORAL | Status: DC
  Filled 2023-01-27: qty 1

## 2023-01-27 MED ORDER — HEPARIN SODIUM (PORCINE) 1000 UNIT/ML IJ SOLN
INTRAMUSCULAR | Status: AC
  Filled 2023-01-27: qty 10

## 2023-01-27 MED ORDER — IOHEXOL 350 MG/ML SOLN
INTRAVENOUS | Status: DC | PRN
  Administered 2023-01-27: 300 mL

## 2023-01-27 MED ORDER — DILTIAZEM HCL ER 60 MG PO CP12: 60.0000 mg | ORAL_CAPSULE | ORAL | Status: DC | PRN

## 2023-01-27 MED ORDER — LABETALOL HCL 5 MG/ML IV SOLN
INTRAVENOUS | Status: DC | PRN
  Administered 2023-01-27: 10 mg via INTRAVENOUS

## 2023-01-27 MED ORDER — RENA-VITE PO TABS
1.0000 | ORAL_TABLET | Freq: Every day | ORAL | Status: DC
  Administered 2023-01-27: 1 via ORAL
  Filled 2023-01-27: qty 1

## 2023-01-27 MED ORDER — LIDOCAINE HCL (PF) 1 % IJ SOLN
INTRAMUSCULAR | Status: DC | PRN
  Administered 2023-01-27: 15 mL via INTRADERMAL

## 2023-01-27 MED ORDER — LIDOCAINE 5 % EX PTCH
1.0000 | MEDICATED_PATCH | Freq: Every day | CUTANEOUS | Status: DC
  Filled 2023-01-27: qty 1

## 2023-01-27 MED ORDER — MIDAZOLAM HCL 2 MG/2ML IJ SOLN
INTRAMUSCULAR | Status: DC | PRN
  Administered 2023-01-27: 1 mg via INTRAVENOUS
  Administered 2023-01-27: .5 mg via INTRAVENOUS

## 2023-01-27 MED ORDER — SEVELAMER CARBONATE 800 MG PO TABS
800.0000 mg | ORAL_TABLET | Freq: Three times a day (TID) | ORAL | Status: DC
  Administered 2023-01-28: 800 mg via ORAL
  Filled 2023-01-27: qty 1

## 2023-01-27 MED ORDER — HYDRALAZINE HCL 20 MG/ML IJ SOLN
10.0000 mg | INTRAMUSCULAR | Status: AC | PRN
Start: 2023-01-27 — End: 2023-01-28

## 2023-01-27 MED ORDER — ASPIRIN 81 MG PO CHEW: 81.0000 mg | CHEWABLE_TABLET | ORAL | Status: DC

## 2023-01-27 MED ORDER — INSULIN ASPART PROT & ASPART (70-30 MIX) 100 UNIT/ML ~~LOC~~ SUSP
30.0000 [IU] | Freq: Two times a day (BID) | SUBCUTANEOUS | Status: DC
Start: 2023-01-28 — End: 2023-01-28
  Administered 2023-01-28: 30 [IU] via SUBCUTANEOUS
  Filled 2023-01-27: qty 10

## 2023-01-27 MED ORDER — LABETALOL HCL 5 MG/ML IV SOLN
INTRAVENOUS | Status: AC
  Filled 2023-01-27: qty 4

## 2023-01-27 MED ORDER — CLOPIDOGREL BISULFATE 300 MG PO TABS
ORAL_TABLET | ORAL | Status: AC
  Filled 2023-01-27: qty 2

## 2023-01-27 MED ORDER — SODIUM CHLORIDE 0.9% FLUSH: 3.0000 mL | INTRAVENOUS | Status: DC | PRN

## 2023-01-27 SURGICAL SUPPLY — 27 items
BALLN EMERGE MR 2.0X12 (BALLOONS) ×1
BALLN EMERGE MR 2.0X15 (BALLOONS) ×1
BALLN SAPPHIRE 1.0X15 (BALLOONS) ×1
BALLN ~~LOC~~ EMERGE MR 2.0X12 (BALLOONS) ×1
BALLN ~~LOC~~ EMERGE MR 2.5X15 (BALLOONS) ×1
BALLOON EMERGE MR 2.0X12 (BALLOONS) IMPLANT
BALLOON EMERGE MR 2.0X15 (BALLOONS) IMPLANT
BALLOON SAPPHIRE 1.0X15 (BALLOONS) IMPLANT
BALLOON TAKERU 1.5X12 (BALLOONS) IMPLANT
BALLOON ~~LOC~~ EMERGE MR 2.0X12 (BALLOONS) IMPLANT
BALLOON ~~LOC~~ EMERGE MR 2.5X15 (BALLOONS) IMPLANT
CATH INFINITI 5FR AL1 (CATHETERS) IMPLANT
CATH INFINITI 5FR MULTPACK ANG (CATHETERS) IMPLANT
CATH LAUNCHER 6FR AL1 (CATHETERS) IMPLANT
CATHETER LAUNCHER 6FR AL1 (CATHETERS) ×1
KIT ENCORE 26 ADVANTAGE (KITS) IMPLANT
KIT MICROPUNCTURE NIT STIFF (SHEATH) IMPLANT
PACK CARDIAC CATHETERIZATION (CUSTOM PROCEDURE TRAY) ×1 IMPLANT
SET ATX-X65L (MISCELLANEOUS) IMPLANT
SHEATH PINNACLE 5F 10CM (SHEATH) IMPLANT
SHEATH PINNACLE 6F 10CM (SHEATH) IMPLANT
SHEATH PROBE COVER 6X72 (BAG) IMPLANT
STENT ONYX FRONTIER 2.0X30 (Permanent Stent) IMPLANT
STENT ONYX FRONTIER 2.5X08 (Permanent Stent) IMPLANT
TUBING CIL FLEX 10 FLL-RA (TUBING) IMPLANT
WIRE ASAHI PROWATER 180CM (WIRE) IMPLANT
WIRE EMERALD 3MM-J .035X260CM (WIRE) IMPLANT

## 2023-01-27 NOTE — Progress Notes (Signed)
Site area: Right groin a 6 french arterial sheath was removed  Site Prior to Removal:  Level 0  Pressure Applied For 30 MINUTES    Bedrest Beginning at 2025p for 4 hours  Manual:   yes  Patient Status During Pull:  stable  Post Pull Groin Site:  Level 0  Post Pull Instructions Given:  Yes.    Post Pull Pulses Present:  Yes.    Dressing Applied:  Yes.    Comments:

## 2023-01-27 NOTE — Interval H&P Note (Signed)
History and Physical Interval Note:  01/27/2023 2:27 PM  Bradley Soto  has presented today for surgery, with the diagnosis of angina.  The various methods of treatment have been discussed with the patient and family. After consideration of risks, benefits and other options for treatment, the patient has consented to  Procedure(s): LEFT HEART CATH AND CORS/GRAFTS ANGIOGRAPHY (N/A)  PERCUTANEOUS CORONARY INTERVENTION  as a surgical intervention.  The patient's history has been reviewed, patient examined, no change in status, stable for surgery.  I have reviewed the patient's chart and labs.  Questions were answered to the patient's satisfaction.    Cath Lab Visit (complete for each Cath Lab visit)  Clinical Evaluation Leading to the Procedure:   ACS: No.  Non-ACS:    Anginal Classification: CCS III  Anti-ischemic medical therapy: Minimal Therapy (1 class of medications)  Non-Invasive Test Results: No non-invasive testing performed  Prior CABG: Previous CABG    Bryan Lemma

## 2023-01-27 NOTE — Progress Notes (Signed)
   01/27/23 2237  BiPAP/CPAP/SIPAP  $ Non-Invasive Ventilator  Non-Invasive Vent Initial  $ Face Mask Small Yes  BiPAP/CPAP/SIPAP Pt Type Adult  BiPAP/CPAP/SIPAP Resmed  Mask Type Nasal mask  Mask Size Small  PEEP 13 cmH20  Patient Home Equipment No  Auto Titrate No

## 2023-01-28 ENCOUNTER — Encounter (HOSPITAL_COMMUNITY): Payer: Self-pay | Admitting: Cardiology

## 2023-01-28 ENCOUNTER — Other Ambulatory Visit (HOSPITAL_COMMUNITY): Payer: Self-pay

## 2023-01-28 DIAGNOSIS — I200 Unstable angina: Secondary | ICD-10-CM

## 2023-01-28 DIAGNOSIS — I5032 Chronic diastolic (congestive) heart failure: Secondary | ICD-10-CM | POA: Diagnosis not present

## 2023-01-28 DIAGNOSIS — I2584 Coronary atherosclerosis due to calcified coronary lesion: Secondary | ICD-10-CM | POA: Diagnosis not present

## 2023-01-28 DIAGNOSIS — I25720 Atherosclerosis of autologous artery coronary artery bypass graft(s) with unstable angina pectoris: Secondary | ICD-10-CM | POA: Diagnosis not present

## 2023-01-28 DIAGNOSIS — D631 Anemia in chronic kidney disease: Secondary | ICD-10-CM | POA: Diagnosis not present

## 2023-01-28 DIAGNOSIS — I2582 Chronic total occlusion of coronary artery: Secondary | ICD-10-CM | POA: Diagnosis not present

## 2023-01-28 DIAGNOSIS — E1122 Type 2 diabetes mellitus with diabetic chronic kidney disease: Secondary | ICD-10-CM | POA: Diagnosis not present

## 2023-01-28 DIAGNOSIS — I480 Paroxysmal atrial fibrillation: Secondary | ICD-10-CM | POA: Diagnosis not present

## 2023-01-28 DIAGNOSIS — Z992 Dependence on renal dialysis: Secondary | ICD-10-CM | POA: Diagnosis not present

## 2023-01-28 DIAGNOSIS — I132 Hypertensive heart and chronic kidney disease with heart failure and with stage 5 chronic kidney disease, or end stage renal disease: Secondary | ICD-10-CM | POA: Diagnosis not present

## 2023-01-28 DIAGNOSIS — N186 End stage renal disease: Secondary | ICD-10-CM | POA: Diagnosis not present

## 2023-01-28 DIAGNOSIS — D509 Iron deficiency anemia, unspecified: Secondary | ICD-10-CM | POA: Diagnosis not present

## 2023-01-28 DIAGNOSIS — E1142 Type 2 diabetes mellitus with diabetic polyneuropathy: Secondary | ICD-10-CM | POA: Diagnosis not present

## 2023-01-28 DIAGNOSIS — N2581 Secondary hyperparathyroidism of renal origin: Secondary | ICD-10-CM | POA: Diagnosis not present

## 2023-01-28 DIAGNOSIS — I25110 Atherosclerotic heart disease of native coronary artery with unstable angina pectoris: Secondary | ICD-10-CM | POA: Diagnosis not present

## 2023-01-28 DIAGNOSIS — Z951 Presence of aortocoronary bypass graft: Secondary | ICD-10-CM | POA: Diagnosis not present

## 2023-01-28 DIAGNOSIS — Z87891 Personal history of nicotine dependence: Secondary | ICD-10-CM | POA: Diagnosis not present

## 2023-01-28 LAB — BASIC METABOLIC PANEL
Anion gap: 15 (ref 5–15)
BUN: 72 mg/dL — ABNORMAL HIGH (ref 8–23)
CO2: 21 mmol/L — ABNORMAL LOW (ref 22–32)
Calcium: 9 mg/dL (ref 8.9–10.3)
Chloride: 96 mmol/L — ABNORMAL LOW (ref 98–111)
Creatinine, Ser: 8.45 mg/dL — ABNORMAL HIGH (ref 0.61–1.24)
GFR, Estimated: 6 mL/min — ABNORMAL LOW (ref >60)
Glucose, Bld: 158 mg/dL — ABNORMAL HIGH (ref 70–99)
Potassium: 4.8 mmol/L (ref 3.5–5.1)
Sodium: 132 mmol/L — ABNORMAL LOW (ref 135–145)

## 2023-01-28 LAB — CBC
HCT: 26.9 % — ABNORMAL LOW (ref 39.0–52.0)
Hemoglobin: 8.9 g/dL — ABNORMAL LOW (ref 13.0–17.0)
MCH: 33.8 pg (ref 26.0–34.0)
MCHC: 33.1 g/dL (ref 30.0–36.0)
MCV: 102.3 fL — ABNORMAL HIGH (ref 80.0–100.0)
Platelets: 158 10*3/uL (ref 150–400)
RBC: 2.63 MIL/uL — ABNORMAL LOW (ref 4.22–5.81)
RDW: 15.9 % — ABNORMAL HIGH (ref 11.5–15.5)
WBC: 8.3 10*3/uL (ref 4.0–10.5)
nRBC: 0 % (ref 0.0–0.2)

## 2023-01-28 LAB — GLUCOSE, CAPILLARY: Glucose-Capillary: 177 mg/dL — ABNORMAL HIGH (ref 70–99)

## 2023-01-28 MED ORDER — GABAPENTIN 100 MG PO CAPS: 300.0000 mg | ORAL_CAPSULE | Freq: Two times a day (BID) | ORAL | Status: DC

## 2023-01-28 MED ORDER — CLOPIDOGREL BISULFATE 75 MG PO TABS
75.0000 mg | ORAL_TABLET | Freq: Every day | ORAL | 3 refills | Status: DC
  Filled 2023-01-28: qty 90, 90d supply, fill #0

## 2023-01-28 MED ORDER — SEVELAMER CARBONATE 800 MG PO TABS: 2400.0000 mg | ORAL_TABLET | Freq: Three times a day (TID) | ORAL | Status: DC

## 2023-01-28 MED FILL — Verapamil HCl IV Soln 2.5 MG/ML: INTRAVENOUS | Qty: 2 | Status: AC

## 2023-01-28 NOTE — Discharge Summary (Signed)
Discharge Summary    Patient ID: Bradley Soto MRN: [id number]; DOB: [birth year]  Admit date: 01/27/2023 Discharge date: 01/28/2023  PCP:  Alysia Penna, MD   [facility] Providers Cardiologist:  Kristeen Miss, MD  Electrophysiologist:  Maurice Small, MD    Discharge Diagnoses    Principal Problem:   Progressive angina [facility])   Diagnostic Studies/Procedures    Cath: 01/27/2023    ------------------SEVERE NATIVE VESSEL CAD---------------------------   Prox LAD to Mid LAD lesion is 85% stenosed.  Mid LAD-1 lesion is 100% stenosed with 100% stenosed side branch in 2nd Diag.   Prox Cx to Mid Cx lesion is 95% stenosed with 85% stenosed side branch in LPAV.  Dist Cx lesion is 100% stenosed with 100% stenosed side branch in 2nd Mrg. 1st Mrg lesion is 99% stenosed.   Ost RCA to Mid RCA lesion is 100% stenosed.   Ramus lesion is 100% stenosed.   ----------------GRAFTS-----------------------------   Seq SVG- Ramus-3rdMrg graft was visualized by angiography and is normal in caliber.  The graft exhibits no disease. The flow is not reversed.   SVG-Diag2 graft was visualized by angiography and is normal in caliber. The graft exhibits no disease. The flow is not reversed.   -------------------- PCI -------------------------   Free LIMA-LAD graft (off the hood of SVG-Diag 2) was visualized by angiography and is large.  Dist Graft lesion is 75% stenosed.   A drug-eluting stent was successfully placed overlapping the 2.0 x 30 mm stent, using a STENT ONYX FRONTIER 2.5X08.  Deployed to 2.75 mm as well as postdilation at the overlapping site to 2.75 mm; Post intervention, there is a 0% residual stenosis.   LIMA -LAD insertion lesion is 80% stenosed. Mid LAD-2 lesion is 95% stenosed just after LIMA insertion.   After extensive/complex PTCA with stepwise balloon PTCA from 1.0 mm to 1.5, 2.5 then 2.0 mm [place] balloon, DES PCI performed.   A drug-eluting stent was successfully placed using a  STENT ONYX FRONTIER 2.0X30.  Deployed to 2.3 mm with postdilation to 2.5 mm throughout the most of the stent and 2.75 mm at the overlapping segment.   Post intervention, there is a 5% residual stenosis in the anastomotic lesion, and 0% residual stenosis in the graft itself including overlap.   Mid LAD to Dist LAD lesion is 50% stenosed distal to the stent placement.  Otherwise the rest of the LAD has diffuse disease..   ---------------------------------------------   LV end diastolic pressure is mildly elevated.   All Post CATH FINDINGS: Severe native vessel CAD:  85% proximal to mid LAD after her SP 1 and D1, then 100 and occluded after SP2 99% proximal to mid LCx prior to stent without occlusion of the distal LCx after OM1 and 99% OM1 100% CTO of RI stent 100% proximal RCA occlusion with extensive small caliber left-to-right collaterals filling the distal branch. 4 of 4 grafts patent: Free LIMA-LAD (from SVG-D2 hood), SVG-D2, SeqSVG-RI-OM 2 Culprit lesions involves 75% distal free LIMA-LAD lesion and 80% - 95% anastomosis lesion from graft into LAD followed by diffuse disease in the LAD. Successful overlapping stents covering both lesions (difficult PCI with stepwise balloon): Onyx Frontier DES 2.0 x 30 mm deployed to 2.3 mm and postdilated to 2.5 mm with a proximal overlapping Onyx Frontier DES 2.5 mm x 8 mm deployed to 2.75 mm including the overlapping segment postdilation. => TIMI-3 flow maintained, lesions reduced to 0%     In the absence of any other complications or  medical issues, we expect the patient to be ready for discharge from an interventional cardiology perspective on 01/28/2023.   Will need to arrange dialysis-preferentially per patient would be to be dialyzed at his home dialysis center.  Wife is contacting the center.   Recommend uninterrupted dual antiplatelet therapy with Aspirin 81mg  daily and Clopidogrel 75mg  daily for a minimum of 12 months (ACS-Class I recommendation).    Would consider converting maintenance from aspirin to Plavix after 1 year, but okay to interrupt after 6 months   Diagnostic Dominance: Co-dominant  Intervention   _____________   History of Present Illness     Bradley Soto is a 82 y.o. male with past medical history of CAD status post CABG '20, ESRD on HD, sick sinus syndrome status post pacemaker, paroxysmal atrial fibrillation, diabetes, gout, hypertension, hyperlipidemia, AS s/p TAVR who is followed by Dr. Elease Hashimoto as an outpatient.  He was most recently seen in the office 11/18 for follow-up and reported having episodes of angina when he developed episodes of anemia.  Nitroglycerin seemed to help.  Options were discussed with the patient including Myoview or cardiac PET scan.  He reported that he had stopped taking his aspirin recently but restarted.  Pain seemed to improve with resuming his aspirin.  It was recommended that if he continued to have episodes of chest pain would pursue 1 of these studies.  Wife called back on 12/3 reporting recurrent episodes of chest pain.  Patient was advised to present to the ED if symptoms continued and was scheduled for outpatient cardiac catheterization.  Hospital Course     CAD s/p CABG '20 -- Underwent cardiac catheterization 12/11 with 4 out of 4 patent grafts but culprit lesion being 75% distal free LIMA to LAD with 80 to 95% anastomosis lesion from graft into the LAD treated successfully with overlapping stents to cover both lesions.  Recommendations for DAPT with aspirin/Plavix for at least 1 year but okay to interrupt at 6 months.  ESRD on HD -- On Tuesday Thursday Saturday schedule -- He will be discharged to go to his normal HD facility for dialysis today. He expressed concern for waiting to skip HD today but I advised against this  HLD -- reports he has not been able to tolerate statin in the past  General: Well developed, well nourished, male appearing in no acute distress. Head:  Normocephalic, atraumatic.  Neck: Supple without bruits, JVD. Lungs:  Resp regular and unlabored, CTA. Heart: RRR, S1, S2, no S3, S4, or murmur; no rub. Abdomen: Soft, non-tender, non-distended with normoactive bowel sounds. No hepatomegaly. No rebound/guarding. No obvious abdominal masses. Extremities: No clubbing, cyanosis, edema. Distal pedal pulses are 2+ bilaterally. Right femoral cath site stable without bruising or hematoma Neuro: Alert and oriented X 3. Moves all extremities spontaneously. Psych: Normal affect.  Patient seen by Dr. [place] and deemed stable for discharge home. Follow up arranged in the office. Medications sent to the [facility] pharmacy prior to discharge.   The patient will be scheduled for a TOC follow up appointment in 10-14 days.  A message has been sent to the [facility] and Scheduling Pool at the office where the patient should be seen for follow up.  _____________  Discharge Vitals Blood pressure 125/61, pulse 72, temperature 97.6 F (36.4 C), temperature source Oral, resp. rate 18, height 5\' 11"  (1.803 m), weight 96.3 kg, SpO2 98%.  Filed Weights   01/27/23 1115 01/27/23 2049 01/28/23 0500  Weight: 99.8 kg 96.3 kg 96.3 kg  Labs & Radiologic Studies    CBC Recent Labs    01/27/23 1110 01/28/23 0341  WBC 9.7 8.3  HGB 10.0* 8.9*  HCT 32.0* 26.9*  MCV 105.3* 102.3*  PLT 184 158   Basic Metabolic Panel Recent Labs    40/98/11 1110 01/28/23 0341  NA 140 132*  K 4.8 4.8  CL 97* 96*  CO2 24 21*  GLUCOSE 130* 158*  BUN 61* 72*  CREATININE 7.29* 8.45*  CALCIUM 9.9 9.0   Liver Function Tests No results for input(s): "AST", "ALT", "ALKPHOS", "BILITOT", "PROT", "ALBUMIN" in the last 72 hours. No results for input(s): "LIPASE", "AMYLASE" in the last 72 hours. High Sensitivity Troponin:   No results for input(s): "TROPONINIHS" in the last 720 hours.  BNP Invalid input(s): "POCBNP" D-Dimer No results for input(s): "DDIMER" in the last 72  hours. Hemoglobin A1C No results for input(s): "HGBA1C" in the last 72 hours. Fasting Lipid Panel No results for input(s): "CHOL", "HDL", "LDLCALC", "TRIG", "CHOLHDL", "LDLDIRECT" in the last 72 hours. Thyroid Function Tests No results for input(s): "TSH", "T4TOTAL", "T3FREE", "THYROIDAB" in the last 72 hours.  Invalid input(s): "FREET3" _____________  CARDIAC CATHETERIZATION Result Date: 01/27/2023   ------------------SEVERE NATIVE VESSEL CAD---------------------------   Prox LAD to Mid LAD lesion is 85% stenosed.  Mid LAD-1 lesion is 100% stenosed with 100% stenosed side branch in 2nd Diag.   Prox Cx to Mid Cx lesion is 95% stenosed with 85% stenosed side branch in LPAV.  Dist Cx lesion is 100% stenosed with 100% stenosed side branch in 2nd Mrg. 1st Mrg lesion is 99% stenosed.   Ost RCA to Mid RCA lesion is 100% stenosed.   Ramus lesion is 100% stenosed.   ----------------GRAFTS-----------------------------   Seq SVG- Ramus-3rdMrg graft was visualized by angiography and is normal in caliber.  The graft exhibits no disease. The flow is not reversed.   SVG-Diag2 graft was visualized by angiography and is normal in caliber. The graft exhibits no disease. The flow is not reversed.   -------------------- PCI -------------------------   Free LIMA-LAD graft (off the hood of SVG-Diag 2) was visualized by angiography and is large.  Dist Graft lesion is 75% stenosed.   A drug-eluting stent was successfully placed overlapping the 2.0 x 30 mm stent, using a STENT ONYX FRONTIER 2.5X08.  Deployed to 2.75 mm as well as postdilation at the overlapping site to 2.75 mm; Post intervention, there is a 0% residual stenosis.   LIMA -LAD insertion lesion is 80% stenosed. Mid LAD-2 lesion is 95% stenosed just after LIMA insertion.   After extensive/complex PTCA with stepwise balloon PTCA from 1.0 mm to 1.5, 2.5 then 2.0 mm [place] balloon, DES PCI performed.   A drug-eluting stent was successfully placed using a STENT ONYX  FRONTIER 2.0X30.  Deployed to 2.3 mm with postdilation to 2.5 mm throughout the most of the stent and 2.75 mm at the overlapping segment.   Post intervention, there is a 5% residual stenosis in the anastomotic lesion, and 0% residual stenosis in the graft itself including overlap.   Mid LAD to Dist LAD lesion is 50% stenosed distal to the stent placement.  Otherwise the rest of the LAD has diffuse disease..   ---------------------------------------------   LV end diastolic pressure is mildly elevated. All Post CATH FINDINGS: Severe native vessel CAD: 85% proximal to mid LAD after her SP 1 and D1, then 100 and occluded after SP2 99% proximal to mid LCx prior to stent without occlusion of the distal LCx  after OM1 and 99% OM1 100% CTO of RI stent 100% proximal RCA occlusion with extensive small caliber left-to-right collaterals filling the distal branch. 4 of 4 grafts patent: Free LIMA-LAD (from SVG-D2 hood), SVG-D2, SeqSVG-RI-OM 2 Culprit lesions involves 75% distal free LIMA-LAD lesion and 80% - 95% anastomosis lesion from graft into LAD followed by diffuse disease in the LAD. Successful overlapping stents covering both lesions (difficult PCI with stepwise balloon): Onyx Frontier DES 2.0 x 30 mm deployed to 2.3 mm and postdilated to 2.5 mm with a proximal overlapping Onyx Frontier DES 2.5 mm x 8 mm deployed to 2.75 mm including the overlapping segment postdilation. => TIMI-3 flow maintained, lesions reduced to 0%   In the absence of any other complications or medical issues, we expect the patient to be ready for discharge from an interventional cardiology perspective on 01/28/2023.   Will need to arrange dialysis-preferentially per patient would be to be dialyzed at his home dialysis center.  Wife is contacting the center.   Recommend uninterrupted dual antiplatelet therapy with Aspirin 81mg  daily and Clopidogrel 75mg  daily for a minimum of 12 months (ACS-Class I recommendation).   Would consider converting  maintenance from aspirin to Plavix after 1 year, but okay to interrupt after 6 months   Disposition   Pt is being discharged home today in good condition.  Follow-up Plans & Appointments     Discharge Instructions     AMB Referral to Cardiac Rehabilitation - Phase II   Complete by: As directed    Diagnosis:  Stable Angina Coronary Stents     After initial evaluation and assessments completed: Virtual Based Care may be provided alone or in conjunction with Phase 2 Cardiac Rehab based on patient barriers.: Yes   Intensive Cardiac Rehabilitation (ICR) [facility] location only OR Traditional Cardiac Rehabilitation (TCR) *If criteria for ICR are not met will enroll in TCR [facility] only): Yes   Call MD for:  difficulty breathing, headache or visual disturbances   Complete by: As directed    Call MD for:  persistant dizziness or light-headedness   Complete by: As directed    Call MD for:  redness, tenderness, or signs of infection (pain, swelling, redness, odor or green/yellow discharge around incision site)   Complete by: As directed    Diet - low sodium heart healthy   Complete by: As directed    Discharge instructions   Complete by: As directed    Groin Site Care Refer to this sheet in the next few weeks. These instructions provide you with information on caring for yourself after your procedure. Your caregiver may also give you more specific instructions. Your treatment has been planned according to current medical practices, but problems sometimes occur. Call your caregiver if you have any problems or questions after your procedure. HOME CARE INSTRUCTIONS You may shower 24 hours after the procedure. Remove the bandage (dressing) and gently wash the site with plain soap and water. Gently pat the site dry.  Do not apply powder or lotion to the site.  Do not sit in a bathtub, swimming pool, or whirlpool for 5 to 7 days.  No bending, squatting, or lifting anything over 10 pounds (4.5 kg) as  directed by your caregiver.  Inspect the site at least twice daily.  Do not drive home if you are discharged the same day of the procedure. Have someone else drive you.  You may drive 24 hours after the procedure unless otherwise instructed by your caregiver.  What to  expect: Any bruising will usually fade within 1 to 2 weeks.  Blood that collects in the tissue (hematoma) may be painful to the touch. It should usually decrease in size and tenderness within 1 to 2 weeks.  SEEK IMMEDIATE MEDICAL CARE IF: You have unusual pain at the groin site or down the affected leg.  You have redness, warmth, swelling, or pain at the groin site.  You have drainage (other than a small amount of blood on the dressing).  You have chills.  You have a fever or persistent symptoms for more than 72 hours.  You have a fever and your symptoms suddenly get worse.  Your leg becomes pale, cool, tingly, or numb.  You have heavy bleeding from the site. Hold pressure on the site. Marland Kitchen  PLEASE DO NOT MISS ANY DOSES OF YOUR PLAVIX!!!!! Also keep a log of you blood pressures and bring back to your follow up appt. Please call the office with any questions.   Patients taking blood thinners should generally stay away from medicines like ibuprofen, Advil, Motrin, naproxen, and Aleve due to risk of stomach bleeding. You may take Tylenol as directed or talk to your primary doctor about alternatives.   PLEASE ENSURE THAT YOU DO NOT RUN OUT OF YOUR PLAVIX. This medication is very important to remain on for at least one year. IF you have issues obtaining this medication due to cost please CALL the office 3-5 business days prior to running out in order to prevent missing doses of this medication.   Increase activity slowly   Complete by: As directed         Discharge Medications   Allergies as of 01/28/2023       Reactions   Shellfish Allergy Rash        Medication List     STOP taking these medications    ibuprofen  200 MG tablet Commonly known as: ADVIL       TAKE these medications    amoxicillin 500 MG tablet Commonly known as: AMOXIL Take 4 tablets (2,000 mg total) by mouth as directed. 1 hour prior to dental work including cleanings   aspirin EC 81 MG tablet Take 81 mg by mouth in the morning.   clopidogrel 75 MG tablet Commonly known as: PLAVIX Take 1 tablet (75 mg total) by mouth daily with breakfast.   Copper Glycinate Powd Take 2 capsules by mouth in the morning.   DIALYVITE 800 WITH ZINC 0.8 MG Tabs Take 1 tablet by mouth at bedtime.   diclofenac Sodium 1 % Gel Commonly known as: VOLTAREN Apply 1 Application topically 4 (four) times daily as needed (neck pain).   diltiazem 60 MG 12 hr capsule Commonly known as: CARDIZEM SR Take 1 capsule (60 mg total) by mouth as needed (palpitations).   diphenhydramine-acetaminophen 25-500 MG Tabs tablet Commonly known as: TYLENOL PM Take 1 tablet by mouth at bedtime.   gabapentin 100 MG capsule Commonly known as: Neurontin Take 3 capsules (300 mg total) by mouth 2 (two) times daily.   MIRCERA IJ Mircera   MIRCERA IJ Mircera   nitroGLYCERIN 0.4 MG SL tablet Commonly known as: NITROSTAT Place 1 tablet (0.4 mg total) under the tongue every 5 (five) minutes as needed for chest pain.   NovoLOG Mix 70/30 FlexPen (70-30) 100 UNIT/ML FlexPen Generic drug: insulin aspart protamine - aspart Inject 30 Units into the skin in the morning and at bedtime.   sevelamer carbonate 800 MG tablet Commonly known as: RENVELA  Take 3 tablets (2,400 mg total) by mouth 3 (three) times daily with meals.   VITAMIN B-12 PO Take 1 capsule by mouth in the morning.   ZZZQUIL PO Take 1-2 tablets by mouth at bedtime. PureZzzs Sleep Supplement (melatonin/chamomile/lavender)           Outstanding Labs/Studies   N/a  Duration of Discharge Encounter   Greater than 30 minutes including physician time.  Signed, Laverda Page, NP 01/28/2023,  8:39 AM

## 2023-01-28 NOTE — Progress Notes (Signed)
Daryel Gerald to be D/C'd Home per MD order.  Discussed with the patient and all questions fully answered.  VSS, Skin clean, dry and intact without evidence of skin break down, no evidence of skin tears noted. IV catheters discontinued intact. Site without signs and symptoms of complications. Dressing and pressure applied.  An After Visit Summary was printed and given to the patient. Patient received prescription from [facility] pharmacy.  D/c education completed with patient/family including follow up instructions, medication list, d/c activities limitations if indicated, with other d/c instructions as indicated by MD - patient able to verbalize understanding, all questions fully answered.   Patient instructed to return to ED, call 911, or call MD for any changes in condition.   Patient escorted via WC, and D/C home via private auto.  Pauletta Browns 01/28/2023 8:55 AM

## 2023-01-28 NOTE — Progress Notes (Signed)
CARDIAC REHAB PHASE I    Pt ambulated in room with no CP, SOB or dizziness. Returned to bedside with call bell in reach. Post stent education including site care, restrictions, risk factors, exercise guidelines, NTG use, antiplatelet therapy importance, heart healthy diabetic diet and CRP2 reviewed with pt and wife.  All questions and concerns addressed. Will refer to [facility] for CRP2. Plan for home later today.    [id number] Woodroe Chen, RN BSN 01/28/2023 8:47 AM

## 2023-01-29 LAB — LIPOPROTEIN A (LPA): Lipoprotein (a): 31.9 nmol/L — ABNORMAL HIGH (ref <75.0)

## 2023-01-30 DIAGNOSIS — Z992 Dependence on renal dialysis: Secondary | ICD-10-CM | POA: Diagnosis not present

## 2023-01-30 DIAGNOSIS — D509 Iron deficiency anemia, unspecified: Secondary | ICD-10-CM | POA: Diagnosis not present

## 2023-01-30 DIAGNOSIS — D631 Anemia in chronic kidney disease: Secondary | ICD-10-CM | POA: Diagnosis not present

## 2023-01-30 DIAGNOSIS — N2581 Secondary hyperparathyroidism of renal origin: Secondary | ICD-10-CM | POA: Diagnosis not present

## 2023-01-30 DIAGNOSIS — E1122 Type 2 diabetes mellitus with diabetic chronic kidney disease: Secondary | ICD-10-CM | POA: Diagnosis not present

## 2023-01-30 DIAGNOSIS — N186 End stage renal disease: Secondary | ICD-10-CM | POA: Diagnosis not present

## 2023-02-02 ENCOUNTER — Telehealth: Payer: Self-pay | Admitting: Cardiovascular Disease

## 2023-02-02 DIAGNOSIS — E1122 Type 2 diabetes mellitus with diabetic chronic kidney disease: Secondary | ICD-10-CM | POA: Diagnosis not present

## 2023-02-02 DIAGNOSIS — N2581 Secondary hyperparathyroidism of renal origin: Secondary | ICD-10-CM | POA: Diagnosis not present

## 2023-02-02 DIAGNOSIS — D509 Iron deficiency anemia, unspecified: Secondary | ICD-10-CM | POA: Diagnosis not present

## 2023-02-02 DIAGNOSIS — Z992 Dependence on renal dialysis: Secondary | ICD-10-CM | POA: Diagnosis not present

## 2023-02-02 DIAGNOSIS — D631 Anemia in chronic kidney disease: Secondary | ICD-10-CM | POA: Diagnosis not present

## 2023-02-02 DIAGNOSIS — N186 End stage renal disease: Secondary | ICD-10-CM | POA: Diagnosis not present

## 2023-02-02 NOTE — Telephone Encounter (Signed)
Wife would like to talk to nurse about the referral to Pulmonary.

## 2023-02-04 DIAGNOSIS — D509 Iron deficiency anemia, unspecified: Secondary | ICD-10-CM | POA: Diagnosis not present

## 2023-02-04 DIAGNOSIS — Z992 Dependence on renal dialysis: Secondary | ICD-10-CM | POA: Diagnosis not present

## 2023-02-04 DIAGNOSIS — N186 End stage renal disease: Secondary | ICD-10-CM | POA: Diagnosis not present

## 2023-02-04 DIAGNOSIS — D631 Anemia in chronic kidney disease: Secondary | ICD-10-CM | POA: Diagnosis not present

## 2023-02-04 DIAGNOSIS — N2581 Secondary hyperparathyroidism of renal origin: Secondary | ICD-10-CM | POA: Diagnosis not present

## 2023-02-04 DIAGNOSIS — E1122 Type 2 diabetes mellitus with diabetic chronic kidney disease: Secondary | ICD-10-CM | POA: Diagnosis not present

## 2023-02-05 NOTE — Telephone Encounter (Signed)
Returned call to patient/wife at number provided, no answer. Left message asking that they call back. Chart shows pt is scheduled to see Dr Tonia Brooms 03/22/23 at [facility].

## 2023-02-06 DIAGNOSIS — Z992 Dependence on renal dialysis: Secondary | ICD-10-CM | POA: Diagnosis not present

## 2023-02-06 DIAGNOSIS — D631 Anemia in chronic kidney disease: Secondary | ICD-10-CM | POA: Diagnosis not present

## 2023-02-06 DIAGNOSIS — E1122 Type 2 diabetes mellitus with diabetic chronic kidney disease: Secondary | ICD-10-CM | POA: Diagnosis not present

## 2023-02-06 DIAGNOSIS — D509 Iron deficiency anemia, unspecified: Secondary | ICD-10-CM | POA: Diagnosis not present

## 2023-02-06 DIAGNOSIS — N186 End stage renal disease: Secondary | ICD-10-CM | POA: Diagnosis not present

## 2023-02-06 DIAGNOSIS — N2581 Secondary hyperparathyroidism of renal origin: Secondary | ICD-10-CM | POA: Diagnosis not present

## 2023-02-08 DIAGNOSIS — D631 Anemia in chronic kidney disease: Secondary | ICD-10-CM | POA: Diagnosis not present

## 2023-02-08 DIAGNOSIS — N186 End stage renal disease: Secondary | ICD-10-CM | POA: Diagnosis not present

## 2023-02-08 DIAGNOSIS — E1122 Type 2 diabetes mellitus with diabetic chronic kidney disease: Secondary | ICD-10-CM | POA: Diagnosis not present

## 2023-02-08 DIAGNOSIS — Z992 Dependence on renal dialysis: Secondary | ICD-10-CM | POA: Diagnosis not present

## 2023-02-08 DIAGNOSIS — D509 Iron deficiency anemia, unspecified: Secondary | ICD-10-CM | POA: Diagnosis not present

## 2023-02-08 DIAGNOSIS — N2581 Secondary hyperparathyroidism of renal origin: Secondary | ICD-10-CM | POA: Diagnosis not present

## 2023-02-09 NOTE — Telephone Encounter (Signed)
Returned call to wife who wanted to know if we could get pulmonology appt sooner for him, but states since the stent he's doing much better and doesn't necessarily need this. Informed we don't have the ability to get him seen sooner at their office, regardless. She also requests that we cancel his January appt with Alden Server since he's scheduled to see Nahser in February. No further questions.

## 2023-02-11 DIAGNOSIS — Z992 Dependence on renal dialysis: Secondary | ICD-10-CM | POA: Diagnosis not present

## 2023-02-11 DIAGNOSIS — D631 Anemia in chronic kidney disease: Secondary | ICD-10-CM | POA: Diagnosis not present

## 2023-02-11 DIAGNOSIS — N186 End stage renal disease: Secondary | ICD-10-CM | POA: Diagnosis not present

## 2023-02-11 DIAGNOSIS — D509 Iron deficiency anemia, unspecified: Secondary | ICD-10-CM | POA: Diagnosis not present

## 2023-02-11 DIAGNOSIS — E1122 Type 2 diabetes mellitus with diabetic chronic kidney disease: Secondary | ICD-10-CM | POA: Diagnosis not present

## 2023-02-11 DIAGNOSIS — N2581 Secondary hyperparathyroidism of renal origin: Secondary | ICD-10-CM | POA: Diagnosis not present

## 2023-02-13 DIAGNOSIS — D631 Anemia in chronic kidney disease: Secondary | ICD-10-CM | POA: Diagnosis not present

## 2023-02-13 DIAGNOSIS — D509 Iron deficiency anemia, unspecified: Secondary | ICD-10-CM | POA: Diagnosis not present

## 2023-02-13 DIAGNOSIS — N186 End stage renal disease: Secondary | ICD-10-CM | POA: Diagnosis not present

## 2023-02-13 DIAGNOSIS — E1122 Type 2 diabetes mellitus with diabetic chronic kidney disease: Secondary | ICD-10-CM | POA: Diagnosis not present

## 2023-02-13 DIAGNOSIS — N2581 Secondary hyperparathyroidism of renal origin: Secondary | ICD-10-CM | POA: Diagnosis not present

## 2023-02-13 DIAGNOSIS — Z992 Dependence on renal dialysis: Secondary | ICD-10-CM | POA: Diagnosis not present

## 2023-02-15 DIAGNOSIS — N2581 Secondary hyperparathyroidism of renal origin: Secondary | ICD-10-CM | POA: Diagnosis not present

## 2023-02-15 DIAGNOSIS — E1122 Type 2 diabetes mellitus with diabetic chronic kidney disease: Secondary | ICD-10-CM | POA: Diagnosis not present

## 2023-02-15 DIAGNOSIS — N186 End stage renal disease: Secondary | ICD-10-CM | POA: Diagnosis not present

## 2023-02-15 DIAGNOSIS — D509 Iron deficiency anemia, unspecified: Secondary | ICD-10-CM | POA: Diagnosis not present

## 2023-02-15 DIAGNOSIS — D631 Anemia in chronic kidney disease: Secondary | ICD-10-CM | POA: Diagnosis not present

## 2023-02-15 DIAGNOSIS — Z992 Dependence on renal dialysis: Secondary | ICD-10-CM | POA: Diagnosis not present

## 2023-02-17 DIAGNOSIS — Z992 Dependence on renal dialysis: Secondary | ICD-10-CM | POA: Diagnosis not present

## 2023-02-17 DIAGNOSIS — I129 Hypertensive chronic kidney disease with stage 1 through stage 4 chronic kidney disease, or unspecified chronic kidney disease: Secondary | ICD-10-CM | POA: Diagnosis not present

## 2023-02-17 DIAGNOSIS — N186 End stage renal disease: Secondary | ICD-10-CM | POA: Diagnosis not present

## 2023-02-18 DIAGNOSIS — D509 Iron deficiency anemia, unspecified: Secondary | ICD-10-CM | POA: Diagnosis not present

## 2023-02-18 DIAGNOSIS — N186 End stage renal disease: Secondary | ICD-10-CM | POA: Diagnosis not present

## 2023-02-18 DIAGNOSIS — N2581 Secondary hyperparathyroidism of renal origin: Secondary | ICD-10-CM | POA: Diagnosis not present

## 2023-02-18 DIAGNOSIS — D631 Anemia in chronic kidney disease: Secondary | ICD-10-CM | POA: Diagnosis not present

## 2023-02-18 DIAGNOSIS — E1122 Type 2 diabetes mellitus with diabetic chronic kidney disease: Secondary | ICD-10-CM | POA: Diagnosis not present

## 2023-02-18 DIAGNOSIS — Z992 Dependence on renal dialysis: Secondary | ICD-10-CM | POA: Diagnosis not present

## 2023-02-20 DIAGNOSIS — N2581 Secondary hyperparathyroidism of renal origin: Secondary | ICD-10-CM | POA: Diagnosis not present

## 2023-02-20 DIAGNOSIS — D631 Anemia in chronic kidney disease: Secondary | ICD-10-CM | POA: Diagnosis not present

## 2023-02-20 DIAGNOSIS — D509 Iron deficiency anemia, unspecified: Secondary | ICD-10-CM | POA: Diagnosis not present

## 2023-02-20 DIAGNOSIS — Z992 Dependence on renal dialysis: Secondary | ICD-10-CM | POA: Diagnosis not present

## 2023-02-20 DIAGNOSIS — E1122 Type 2 diabetes mellitus with diabetic chronic kidney disease: Secondary | ICD-10-CM | POA: Diagnosis not present

## 2023-02-20 DIAGNOSIS — N186 End stage renal disease: Secondary | ICD-10-CM | POA: Diagnosis not present

## 2023-02-23 DIAGNOSIS — Z992 Dependence on renal dialysis: Secondary | ICD-10-CM | POA: Diagnosis not present

## 2023-02-23 DIAGNOSIS — N186 End stage renal disease: Secondary | ICD-10-CM | POA: Diagnosis not present

## 2023-02-23 DIAGNOSIS — D509 Iron deficiency anemia, unspecified: Secondary | ICD-10-CM | POA: Diagnosis not present

## 2023-02-23 DIAGNOSIS — N2581 Secondary hyperparathyroidism of renal origin: Secondary | ICD-10-CM | POA: Diagnosis not present

## 2023-02-23 DIAGNOSIS — E1122 Type 2 diabetes mellitus with diabetic chronic kidney disease: Secondary | ICD-10-CM | POA: Diagnosis not present

## 2023-02-23 DIAGNOSIS — D631 Anemia in chronic kidney disease: Secondary | ICD-10-CM | POA: Diagnosis not present

## 2023-02-24 ENCOUNTER — Ambulatory Visit: Payer: Medicare Other | Admitting: Nurse Practitioner

## 2023-02-25 DIAGNOSIS — Z992 Dependence on renal dialysis: Secondary | ICD-10-CM | POA: Diagnosis not present

## 2023-02-25 DIAGNOSIS — D509 Iron deficiency anemia, unspecified: Secondary | ICD-10-CM | POA: Diagnosis not present

## 2023-02-25 DIAGNOSIS — N186 End stage renal disease: Secondary | ICD-10-CM | POA: Diagnosis not present

## 2023-02-25 DIAGNOSIS — E1122 Type 2 diabetes mellitus with diabetic chronic kidney disease: Secondary | ICD-10-CM | POA: Diagnosis not present

## 2023-02-25 DIAGNOSIS — D631 Anemia in chronic kidney disease: Secondary | ICD-10-CM | POA: Diagnosis not present

## 2023-02-25 DIAGNOSIS — N2581 Secondary hyperparathyroidism of renal origin: Secondary | ICD-10-CM | POA: Diagnosis not present

## 2023-02-26 ENCOUNTER — Ambulatory Visit (INDEPENDENT_AMBULATORY_CARE_PROVIDER_SITE_OTHER): Payer: Medicare Other

## 2023-02-26 DIAGNOSIS — I495 Sick sinus syndrome: Secondary | ICD-10-CM

## 2023-02-27 DIAGNOSIS — Z992 Dependence on renal dialysis: Secondary | ICD-10-CM | POA: Diagnosis not present

## 2023-02-27 DIAGNOSIS — E1122 Type 2 diabetes mellitus with diabetic chronic kidney disease: Secondary | ICD-10-CM | POA: Diagnosis not present

## 2023-02-27 DIAGNOSIS — N2581 Secondary hyperparathyroidism of renal origin: Secondary | ICD-10-CM | POA: Diagnosis not present

## 2023-02-27 DIAGNOSIS — N186 End stage renal disease: Secondary | ICD-10-CM | POA: Diagnosis not present

## 2023-02-27 DIAGNOSIS — D509 Iron deficiency anemia, unspecified: Secondary | ICD-10-CM | POA: Diagnosis not present

## 2023-02-27 DIAGNOSIS — D631 Anemia in chronic kidney disease: Secondary | ICD-10-CM | POA: Diagnosis not present

## 2023-02-27 LAB — CUP PACEART REMOTE DEVICE CHECK
Battery Remaining Longevity: 109 mo
Battery Voltage: 2.99 V
Brady Statistic AP VP Percent: 10.98 %
Brady Statistic AP VS Percent: 0.01 %
Brady Statistic AS VP Percent: 88.71 %
Brady Statistic AS VS Percent: 0.3 %
Brady Statistic RA Percent Paced: 11.06 %
Brady Statistic RV Percent Paced: 99.69 %
Date Time Interrogation Session: 20250110050900
Implantable Lead Connection Status: 753985
Implantable Lead Connection Status: 753985
Implantable Lead Implant Date: 20020325
Implantable Lead Implant Date: 20020325
Implantable Lead Location: 753859
Implantable Lead Location: 753860
Implantable Lead Model: 4092
Implantable Lead Model: 4524
Implantable Pulse Generator Implant Date: 20210415
Lead Channel Impedance Value: 247 Ohm
Lead Channel Impedance Value: 304 Ohm
Lead Channel Impedance Value: 437 Ohm
Lead Channel Impedance Value: 646 Ohm
Lead Channel Pacing Threshold Amplitude: 0.625 V
Lead Channel Pacing Threshold Amplitude: 2.5 V
Lead Channel Pacing Threshold Pulse Width: 0.4 ms
Lead Channel Pacing Threshold Pulse Width: 0.4 ms
Lead Channel Sensing Intrinsic Amplitude: 0.875 mV
Lead Channel Sensing Intrinsic Amplitude: 0.875 mV
Lead Channel Sensing Intrinsic Amplitude: 31.625 mV
Lead Channel Sensing Intrinsic Amplitude: 31.625 mV
Lead Channel Setting Pacing Amplitude: 2.5 V
Lead Channel Setting Pacing Amplitude: 3 V
Lead Channel Setting Pacing Pulse Width: 0.4 ms
Lead Channel Setting Sensing Sensitivity: 1.2 mV
Zone Setting Status: 755011
Zone Setting Status: 755011

## 2023-03-02 DIAGNOSIS — N2581 Secondary hyperparathyroidism of renal origin: Secondary | ICD-10-CM | POA: Diagnosis not present

## 2023-03-02 DIAGNOSIS — N186 End stage renal disease: Secondary | ICD-10-CM | POA: Diagnosis not present

## 2023-03-02 DIAGNOSIS — D509 Iron deficiency anemia, unspecified: Secondary | ICD-10-CM | POA: Diagnosis not present

## 2023-03-02 DIAGNOSIS — D631 Anemia in chronic kidney disease: Secondary | ICD-10-CM | POA: Diagnosis not present

## 2023-03-02 DIAGNOSIS — Z992 Dependence on renal dialysis: Secondary | ICD-10-CM | POA: Diagnosis not present

## 2023-03-02 DIAGNOSIS — E1122 Type 2 diabetes mellitus with diabetic chronic kidney disease: Secondary | ICD-10-CM | POA: Diagnosis not present

## 2023-03-04 DIAGNOSIS — N2581 Secondary hyperparathyroidism of renal origin: Secondary | ICD-10-CM | POA: Diagnosis not present

## 2023-03-04 DIAGNOSIS — E1122 Type 2 diabetes mellitus with diabetic chronic kidney disease: Secondary | ICD-10-CM | POA: Diagnosis not present

## 2023-03-04 DIAGNOSIS — D509 Iron deficiency anemia, unspecified: Secondary | ICD-10-CM | POA: Diagnosis not present

## 2023-03-04 DIAGNOSIS — Z992 Dependence on renal dialysis: Secondary | ICD-10-CM | POA: Diagnosis not present

## 2023-03-04 DIAGNOSIS — D631 Anemia in chronic kidney disease: Secondary | ICD-10-CM | POA: Diagnosis not present

## 2023-03-04 DIAGNOSIS — N186 End stage renal disease: Secondary | ICD-10-CM | POA: Diagnosis not present

## 2023-03-05 DIAGNOSIS — I132 Hypertensive heart and chronic kidney disease with heart failure and with stage 5 chronic kidney disease, or end stage renal disease: Secondary | ICD-10-CM | POA: Diagnosis not present

## 2023-03-05 DIAGNOSIS — E213 Hyperparathyroidism, unspecified: Secondary | ICD-10-CM | POA: Diagnosis not present

## 2023-03-05 DIAGNOSIS — M109 Gout, unspecified: Secondary | ICD-10-CM | POA: Diagnosis not present

## 2023-03-05 DIAGNOSIS — Z125 Encounter for screening for malignant neoplasm of prostate: Secondary | ICD-10-CM | POA: Diagnosis not present

## 2023-03-05 DIAGNOSIS — E1142 Type 2 diabetes mellitus with diabetic polyneuropathy: Secondary | ICD-10-CM | POA: Diagnosis not present

## 2023-03-05 DIAGNOSIS — E785 Hyperlipidemia, unspecified: Secondary | ICD-10-CM | POA: Diagnosis not present

## 2023-03-05 DIAGNOSIS — I5032 Chronic diastolic (congestive) heart failure: Secondary | ICD-10-CM | POA: Diagnosis not present

## 2023-03-05 DIAGNOSIS — N186 End stage renal disease: Secondary | ICD-10-CM | POA: Diagnosis not present

## 2023-03-05 DIAGNOSIS — Z992 Dependence on renal dialysis: Secondary | ICD-10-CM | POA: Diagnosis not present

## 2023-03-06 DIAGNOSIS — E1122 Type 2 diabetes mellitus with diabetic chronic kidney disease: Secondary | ICD-10-CM | POA: Diagnosis not present

## 2023-03-06 DIAGNOSIS — D509 Iron deficiency anemia, unspecified: Secondary | ICD-10-CM | POA: Diagnosis not present

## 2023-03-06 DIAGNOSIS — Z992 Dependence on renal dialysis: Secondary | ICD-10-CM | POA: Diagnosis not present

## 2023-03-06 DIAGNOSIS — N2581 Secondary hyperparathyroidism of renal origin: Secondary | ICD-10-CM | POA: Diagnosis not present

## 2023-03-06 DIAGNOSIS — N186 End stage renal disease: Secondary | ICD-10-CM | POA: Diagnosis not present

## 2023-03-06 DIAGNOSIS — D631 Anemia in chronic kidney disease: Secondary | ICD-10-CM | POA: Diagnosis not present

## 2023-03-09 DIAGNOSIS — N2581 Secondary hyperparathyroidism of renal origin: Secondary | ICD-10-CM | POA: Diagnosis not present

## 2023-03-09 DIAGNOSIS — D509 Iron deficiency anemia, unspecified: Secondary | ICD-10-CM | POA: Diagnosis not present

## 2023-03-09 DIAGNOSIS — Z992 Dependence on renal dialysis: Secondary | ICD-10-CM | POA: Diagnosis not present

## 2023-03-09 DIAGNOSIS — D631 Anemia in chronic kidney disease: Secondary | ICD-10-CM | POA: Diagnosis not present

## 2023-03-09 DIAGNOSIS — N186 End stage renal disease: Secondary | ICD-10-CM | POA: Diagnosis not present

## 2023-03-09 DIAGNOSIS — E1122 Type 2 diabetes mellitus with diabetic chronic kidney disease: Secondary | ICD-10-CM | POA: Diagnosis not present

## 2023-03-10 ENCOUNTER — Encounter: Payer: Self-pay | Admitting: Cardiovascular Disease

## 2023-03-10 ENCOUNTER — Institutional Professional Consult (permissible substitution): Payer: Medicare Other | Admitting: Acute Care

## 2023-03-11 DIAGNOSIS — Z992 Dependence on renal dialysis: Secondary | ICD-10-CM | POA: Diagnosis not present

## 2023-03-11 DIAGNOSIS — E1122 Type 2 diabetes mellitus with diabetic chronic kidney disease: Secondary | ICD-10-CM | POA: Diagnosis not present

## 2023-03-11 DIAGNOSIS — N186 End stage renal disease: Secondary | ICD-10-CM | POA: Diagnosis not present

## 2023-03-11 DIAGNOSIS — D631 Anemia in chronic kidney disease: Secondary | ICD-10-CM | POA: Diagnosis not present

## 2023-03-11 DIAGNOSIS — N2581 Secondary hyperparathyroidism of renal origin: Secondary | ICD-10-CM | POA: Diagnosis not present

## 2023-03-11 DIAGNOSIS — D509 Iron deficiency anemia, unspecified: Secondary | ICD-10-CM | POA: Diagnosis not present

## 2023-03-12 DIAGNOSIS — I739 Peripheral vascular disease, unspecified: Secondary | ICD-10-CM | POA: Diagnosis not present

## 2023-03-12 DIAGNOSIS — I10 Essential (primary) hypertension: Secondary | ICD-10-CM | POA: Diagnosis not present

## 2023-03-12 DIAGNOSIS — E1169 Type 2 diabetes mellitus with other specified complication: Secondary | ICD-10-CM | POA: Diagnosis not present

## 2023-03-12 DIAGNOSIS — Z794 Long term (current) use of insulin: Secondary | ICD-10-CM | POA: Diagnosis not present

## 2023-03-12 DIAGNOSIS — I132 Hypertensive heart and chronic kidney disease with heart failure and with stage 5 chronic kidney disease, or end stage renal disease: Secondary | ICD-10-CM | POA: Diagnosis not present

## 2023-03-12 DIAGNOSIS — I5022 Chronic systolic (congestive) heart failure: Secondary | ICD-10-CM | POA: Diagnosis not present

## 2023-03-12 DIAGNOSIS — Z950 Presence of cardiac pacemaker: Secondary | ICD-10-CM | POA: Diagnosis not present

## 2023-03-12 DIAGNOSIS — Z0000 Encounter for general adult medical examination without abnormal findings: Secondary | ICD-10-CM | POA: Diagnosis not present

## 2023-03-12 DIAGNOSIS — Z992 Dependence on renal dialysis: Secondary | ICD-10-CM | POA: Diagnosis not present

## 2023-03-12 DIAGNOSIS — Z1331 Encounter for screening for depression: Secondary | ICD-10-CM | POA: Diagnosis not present

## 2023-03-12 DIAGNOSIS — L309 Dermatitis, unspecified: Secondary | ICD-10-CM | POA: Diagnosis not present

## 2023-03-12 DIAGNOSIS — Z1339 Encounter for screening examination for other mental health and behavioral disorders: Secondary | ICD-10-CM | POA: Diagnosis not present

## 2023-03-12 DIAGNOSIS — N185 Chronic kidney disease, stage 5: Secondary | ICD-10-CM | POA: Diagnosis not present

## 2023-03-12 DIAGNOSIS — E1142 Type 2 diabetes mellitus with diabetic polyneuropathy: Secondary | ICD-10-CM | POA: Diagnosis not present

## 2023-03-12 DIAGNOSIS — I480 Paroxysmal atrial fibrillation: Secondary | ICD-10-CM | POA: Diagnosis not present

## 2023-03-12 DIAGNOSIS — D649 Anemia, unspecified: Secondary | ICD-10-CM | POA: Diagnosis not present

## 2023-03-12 DIAGNOSIS — D692 Other nonthrombocytopenic purpura: Secondary | ICD-10-CM | POA: Diagnosis not present

## 2023-03-12 DIAGNOSIS — I770 Arteriovenous fistula, acquired: Secondary | ICD-10-CM | POA: Diagnosis not present

## 2023-03-12 DIAGNOSIS — F330 Major depressive disorder, recurrent, mild: Secondary | ICD-10-CM | POA: Diagnosis not present

## 2023-03-12 DIAGNOSIS — E213 Hyperparathyroidism, unspecified: Secondary | ICD-10-CM | POA: Diagnosis not present

## 2023-03-13 DIAGNOSIS — N186 End stage renal disease: Secondary | ICD-10-CM | POA: Diagnosis not present

## 2023-03-13 DIAGNOSIS — E1122 Type 2 diabetes mellitus with diabetic chronic kidney disease: Secondary | ICD-10-CM | POA: Diagnosis not present

## 2023-03-13 DIAGNOSIS — Z992 Dependence on renal dialysis: Secondary | ICD-10-CM | POA: Diagnosis not present

## 2023-03-13 DIAGNOSIS — N2581 Secondary hyperparathyroidism of renal origin: Secondary | ICD-10-CM | POA: Diagnosis not present

## 2023-03-13 DIAGNOSIS — D631 Anemia in chronic kidney disease: Secondary | ICD-10-CM | POA: Diagnosis not present

## 2023-03-13 DIAGNOSIS — D509 Iron deficiency anemia, unspecified: Secondary | ICD-10-CM | POA: Diagnosis not present

## 2023-03-16 DIAGNOSIS — D509 Iron deficiency anemia, unspecified: Secondary | ICD-10-CM | POA: Diagnosis not present

## 2023-03-16 DIAGNOSIS — Z992 Dependence on renal dialysis: Secondary | ICD-10-CM | POA: Diagnosis not present

## 2023-03-16 DIAGNOSIS — E1122 Type 2 diabetes mellitus with diabetic chronic kidney disease: Secondary | ICD-10-CM | POA: Diagnosis not present

## 2023-03-16 DIAGNOSIS — N186 End stage renal disease: Secondary | ICD-10-CM | POA: Diagnosis not present

## 2023-03-16 DIAGNOSIS — N2581 Secondary hyperparathyroidism of renal origin: Secondary | ICD-10-CM | POA: Diagnosis not present

## 2023-03-16 DIAGNOSIS — D631 Anemia in chronic kidney disease: Secondary | ICD-10-CM | POA: Diagnosis not present

## 2023-03-18 DIAGNOSIS — N186 End stage renal disease: Secondary | ICD-10-CM | POA: Diagnosis not present

## 2023-03-18 DIAGNOSIS — Z992 Dependence on renal dialysis: Secondary | ICD-10-CM | POA: Diagnosis not present

## 2023-03-18 DIAGNOSIS — E1122 Type 2 diabetes mellitus with diabetic chronic kidney disease: Secondary | ICD-10-CM | POA: Diagnosis not present

## 2023-03-18 DIAGNOSIS — D631 Anemia in chronic kidney disease: Secondary | ICD-10-CM | POA: Diagnosis not present

## 2023-03-18 DIAGNOSIS — D509 Iron deficiency anemia, unspecified: Secondary | ICD-10-CM | POA: Diagnosis not present

## 2023-03-18 DIAGNOSIS — N2581 Secondary hyperparathyroidism of renal origin: Secondary | ICD-10-CM | POA: Diagnosis not present

## 2023-03-20 DIAGNOSIS — D509 Iron deficiency anemia, unspecified: Secondary | ICD-10-CM | POA: Diagnosis not present

## 2023-03-20 DIAGNOSIS — N2581 Secondary hyperparathyroidism of renal origin: Secondary | ICD-10-CM | POA: Diagnosis not present

## 2023-03-20 DIAGNOSIS — D631 Anemia in chronic kidney disease: Secondary | ICD-10-CM | POA: Diagnosis not present

## 2023-03-20 DIAGNOSIS — Z992 Dependence on renal dialysis: Secondary | ICD-10-CM | POA: Diagnosis not present

## 2023-03-20 DIAGNOSIS — N186 End stage renal disease: Secondary | ICD-10-CM | POA: Diagnosis not present

## 2023-03-20 DIAGNOSIS — I129 Hypertensive chronic kidney disease with stage 1 through stage 4 chronic kidney disease, or unspecified chronic kidney disease: Secondary | ICD-10-CM | POA: Diagnosis not present

## 2023-03-22 ENCOUNTER — Institutional Professional Consult (permissible substitution): Payer: Medicare Other | Admitting: Pulmonary Disease

## 2023-03-23 DIAGNOSIS — Z992 Dependence on renal dialysis: Secondary | ICD-10-CM | POA: Diagnosis not present

## 2023-03-23 DIAGNOSIS — N2581 Secondary hyperparathyroidism of renal origin: Secondary | ICD-10-CM | POA: Diagnosis not present

## 2023-03-23 DIAGNOSIS — D509 Iron deficiency anemia, unspecified: Secondary | ICD-10-CM | POA: Diagnosis not present

## 2023-03-23 DIAGNOSIS — D631 Anemia in chronic kidney disease: Secondary | ICD-10-CM | POA: Diagnosis not present

## 2023-03-23 DIAGNOSIS — N186 End stage renal disease: Secondary | ICD-10-CM | POA: Diagnosis not present

## 2023-03-24 ENCOUNTER — Ambulatory Visit (HOSPITAL_COMMUNITY): Admission: RE | Admit: 2023-03-24 | Payer: Medicare Other | Source: Ambulatory Visit

## 2023-03-24 ENCOUNTER — Other Ambulatory Visit (HOSPITAL_COMMUNITY): Payer: Self-pay | Admitting: Internal Medicine

## 2023-03-24 DIAGNOSIS — I739 Peripheral vascular disease, unspecified: Secondary | ICD-10-CM

## 2023-03-25 DIAGNOSIS — Z992 Dependence on renal dialysis: Secondary | ICD-10-CM | POA: Diagnosis not present

## 2023-03-25 DIAGNOSIS — D509 Iron deficiency anemia, unspecified: Secondary | ICD-10-CM | POA: Diagnosis not present

## 2023-03-25 DIAGNOSIS — N186 End stage renal disease: Secondary | ICD-10-CM | POA: Diagnosis not present

## 2023-03-25 DIAGNOSIS — D631 Anemia in chronic kidney disease: Secondary | ICD-10-CM | POA: Diagnosis not present

## 2023-03-25 DIAGNOSIS — N2581 Secondary hyperparathyroidism of renal origin: Secondary | ICD-10-CM | POA: Diagnosis not present

## 2023-03-27 DIAGNOSIS — D631 Anemia in chronic kidney disease: Secondary | ICD-10-CM | POA: Diagnosis not present

## 2023-03-27 DIAGNOSIS — N186 End stage renal disease: Secondary | ICD-10-CM | POA: Diagnosis not present

## 2023-03-27 DIAGNOSIS — Z992 Dependence on renal dialysis: Secondary | ICD-10-CM | POA: Diagnosis not present

## 2023-03-27 DIAGNOSIS — D509 Iron deficiency anemia, unspecified: Secondary | ICD-10-CM | POA: Diagnosis not present

## 2023-03-27 DIAGNOSIS — N2581 Secondary hyperparathyroidism of renal origin: Secondary | ICD-10-CM | POA: Diagnosis not present

## 2023-03-30 DIAGNOSIS — Z992 Dependence on renal dialysis: Secondary | ICD-10-CM | POA: Diagnosis not present

## 2023-03-30 DIAGNOSIS — N186 End stage renal disease: Secondary | ICD-10-CM | POA: Diagnosis not present

## 2023-03-30 DIAGNOSIS — D509 Iron deficiency anemia, unspecified: Secondary | ICD-10-CM | POA: Diagnosis not present

## 2023-03-30 DIAGNOSIS — D631 Anemia in chronic kidney disease: Secondary | ICD-10-CM | POA: Diagnosis not present

## 2023-03-30 DIAGNOSIS — N2581 Secondary hyperparathyroidism of renal origin: Secondary | ICD-10-CM | POA: Diagnosis not present

## 2023-03-31 ENCOUNTER — Institutional Professional Consult (permissible substitution): Payer: Medicare Other | Admitting: Emergency Medicine

## 2023-04-01 DIAGNOSIS — N186 End stage renal disease: Secondary | ICD-10-CM | POA: Diagnosis not present

## 2023-04-01 DIAGNOSIS — D631 Anemia in chronic kidney disease: Secondary | ICD-10-CM | POA: Diagnosis not present

## 2023-04-01 DIAGNOSIS — Z992 Dependence on renal dialysis: Secondary | ICD-10-CM | POA: Diagnosis not present

## 2023-04-01 DIAGNOSIS — N2581 Secondary hyperparathyroidism of renal origin: Secondary | ICD-10-CM | POA: Diagnosis not present

## 2023-04-01 DIAGNOSIS — D509 Iron deficiency anemia, unspecified: Secondary | ICD-10-CM | POA: Diagnosis not present

## 2023-04-05 ENCOUNTER — Institutional Professional Consult (permissible substitution): Payer: Medicare Other | Admitting: Acute Care

## 2023-04-06 DIAGNOSIS — N2581 Secondary hyperparathyroidism of renal origin: Secondary | ICD-10-CM | POA: Diagnosis not present

## 2023-04-06 DIAGNOSIS — D631 Anemia in chronic kidney disease: Secondary | ICD-10-CM | POA: Diagnosis not present

## 2023-04-06 DIAGNOSIS — D509 Iron deficiency anemia, unspecified: Secondary | ICD-10-CM | POA: Diagnosis not present

## 2023-04-06 DIAGNOSIS — N186 End stage renal disease: Secondary | ICD-10-CM | POA: Diagnosis not present

## 2023-04-06 DIAGNOSIS — Z992 Dependence on renal dialysis: Secondary | ICD-10-CM | POA: Diagnosis not present

## 2023-04-06 NOTE — Addendum Note (Signed)
Addended by: Elease Etienne A on: 04/06/2023 11:42 AM   Modules accepted: Orders

## 2023-04-06 NOTE — Progress Notes (Signed)
 Remote pacemaker transmission.

## 2023-04-07 ENCOUNTER — Encounter (HOSPITAL_COMMUNITY): Payer: Self-pay

## 2023-04-07 ENCOUNTER — Inpatient Hospital Stay (HOSPITAL_COMMUNITY)
Admission: EM | Admit: 2023-04-07 | Discharge: 2023-04-17 | DRG: 871 | Disposition: E | Payer: Medicare Other | Attending: Internal Medicine | Admitting: Internal Medicine

## 2023-04-07 ENCOUNTER — Emergency Department (HOSPITAL_COMMUNITY): Payer: Medicare Other

## 2023-04-07 ENCOUNTER — Ambulatory Visit (HOSPITAL_COMMUNITY): Payer: Medicare Other

## 2023-04-07 ENCOUNTER — Other Ambulatory Visit: Payer: Self-pay

## 2023-04-07 ENCOUNTER — Encounter (HOSPITAL_COMMUNITY): Payer: Self-pay | Admitting: Emergency Medicine

## 2023-04-07 ENCOUNTER — Inpatient Hospital Stay (HOSPITAL_COMMUNITY): Payer: Medicare Other

## 2023-04-07 DIAGNOSIS — I132 Hypertensive heart and chronic kidney disease with heart failure and with stage 5 chronic kidney disease, or end stage renal disease: Secondary | ICD-10-CM | POA: Diagnosis not present

## 2023-04-07 DIAGNOSIS — Z6831 Body mass index (BMI) 31.0-31.9, adult: Secondary | ICD-10-CM

## 2023-04-07 DIAGNOSIS — I5A Non-ischemic myocardial injury (non-traumatic): Secondary | ICD-10-CM

## 2023-04-07 DIAGNOSIS — I120 Hypertensive chronic kidney disease with stage 5 chronic kidney disease or end stage renal disease: Secondary | ICD-10-CM | POA: Diagnosis not present

## 2023-04-07 DIAGNOSIS — Z950 Presence of cardiac pacemaker: Secondary | ICD-10-CM | POA: Diagnosis present

## 2023-04-07 DIAGNOSIS — Z7982 Long term (current) use of aspirin: Secondary | ICD-10-CM

## 2023-04-07 DIAGNOSIS — D696 Thrombocytopenia, unspecified: Secondary | ICD-10-CM | POA: Diagnosis present

## 2023-04-07 DIAGNOSIS — R162 Hepatomegaly with splenomegaly, not elsewhere classified: Secondary | ICD-10-CM | POA: Diagnosis not present

## 2023-04-07 DIAGNOSIS — I472 Ventricular tachycardia, unspecified: Secondary | ICD-10-CM | POA: Diagnosis not present

## 2023-04-07 DIAGNOSIS — N2581 Secondary hyperparathyroidism of renal origin: Secondary | ICD-10-CM | POA: Diagnosis present

## 2023-04-07 DIAGNOSIS — N186 End stage renal disease: Secondary | ICD-10-CM | POA: Diagnosis present

## 2023-04-07 DIAGNOSIS — R531 Weakness: Secondary | ICD-10-CM | POA: Diagnosis not present

## 2023-04-07 DIAGNOSIS — I5033 Acute on chronic diastolic (congestive) heart failure: Secondary | ICD-10-CM | POA: Diagnosis present

## 2023-04-07 DIAGNOSIS — Z45018 Encounter for adjustment and management of other part of cardiac pacemaker: Secondary | ICD-10-CM

## 2023-04-07 DIAGNOSIS — D6959 Other secondary thrombocytopenia: Secondary | ICD-10-CM | POA: Diagnosis present

## 2023-04-07 DIAGNOSIS — E1122 Type 2 diabetes mellitus with diabetic chronic kidney disease: Secondary | ICD-10-CM | POA: Diagnosis present

## 2023-04-07 DIAGNOSIS — U071 COVID-19: Secondary | ICD-10-CM | POA: Diagnosis not present

## 2023-04-07 DIAGNOSIS — K769 Liver disease, unspecified: Secondary | ICD-10-CM | POA: Diagnosis not present

## 2023-04-07 DIAGNOSIS — I2489 Other forms of acute ischemic heart disease: Secondary | ICD-10-CM | POA: Diagnosis present

## 2023-04-07 DIAGNOSIS — I4901 Ventricular fibrillation: Secondary | ICD-10-CM | POA: Diagnosis not present

## 2023-04-07 DIAGNOSIS — A4189 Other specified sepsis: Principal | ICD-10-CM | POA: Diagnosis present

## 2023-04-07 DIAGNOSIS — Z8249 Family history of ischemic heart disease and other diseases of the circulatory system: Secondary | ICD-10-CM

## 2023-04-07 DIAGNOSIS — R058 Other specified cough: Secondary | ICD-10-CM | POA: Diagnosis not present

## 2023-04-07 DIAGNOSIS — J210 Acute bronchiolitis due to respiratory syncytial virus: Secondary | ICD-10-CM | POA: Diagnosis present

## 2023-04-07 DIAGNOSIS — K7200 Acute and subacute hepatic failure without coma: Secondary | ICD-10-CM | POA: Diagnosis present

## 2023-04-07 DIAGNOSIS — R748 Abnormal levels of other serum enzymes: Secondary | ICD-10-CM | POA: Diagnosis not present

## 2023-04-07 DIAGNOSIS — I2510 Atherosclerotic heart disease of native coronary artery without angina pectoris: Secondary | ICD-10-CM | POA: Diagnosis present

## 2023-04-07 DIAGNOSIS — I469 Cardiac arrest, cause unspecified: Secondary | ICD-10-CM | POA: Diagnosis not present

## 2023-04-07 DIAGNOSIS — E861 Hypovolemia: Secondary | ICD-10-CM

## 2023-04-07 DIAGNOSIS — Z803 Family history of malignant neoplasm of breast: Secondary | ICD-10-CM

## 2023-04-07 DIAGNOSIS — Z952 Presence of prosthetic heart valve: Secondary | ICD-10-CM | POA: Diagnosis not present

## 2023-04-07 DIAGNOSIS — Z860100 Personal history of colon polyps, unspecified: Secondary | ICD-10-CM

## 2023-04-07 DIAGNOSIS — E669 Obesity, unspecified: Secondary | ICD-10-CM | POA: Diagnosis present

## 2023-04-07 DIAGNOSIS — Z87891 Personal history of nicotine dependence: Secondary | ICD-10-CM

## 2023-04-07 DIAGNOSIS — E872 Acidosis, unspecified: Secondary | ICD-10-CM | POA: Diagnosis present

## 2023-04-07 DIAGNOSIS — G4733 Obstructive sleep apnea (adult) (pediatric): Secondary | ICD-10-CM | POA: Diagnosis present

## 2023-04-07 DIAGNOSIS — I252 Old myocardial infarction: Secondary | ICD-10-CM

## 2023-04-07 DIAGNOSIS — I4729 Other ventricular tachycardia: Secondary | ICD-10-CM | POA: Diagnosis present

## 2023-04-07 DIAGNOSIS — D631 Anemia in chronic kidney disease: Secondary | ICD-10-CM | POA: Diagnosis not present

## 2023-04-07 DIAGNOSIS — R7989 Other specified abnormal findings of blood chemistry: Secondary | ICD-10-CM | POA: Diagnosis not present

## 2023-04-07 DIAGNOSIS — B338 Other specified viral diseases: Secondary | ICD-10-CM | POA: Diagnosis not present

## 2023-04-07 DIAGNOSIS — I10 Essential (primary) hypertension: Secondary | ICD-10-CM | POA: Diagnosis not present

## 2023-04-07 DIAGNOSIS — Z992 Dependence on renal dialysis: Secondary | ICD-10-CM

## 2023-04-07 DIAGNOSIS — Z951 Presence of aortocoronary bypass graft: Secondary | ICD-10-CM

## 2023-04-07 DIAGNOSIS — R918 Other nonspecific abnormal finding of lung field: Secondary | ICD-10-CM | POA: Diagnosis not present

## 2023-04-07 DIAGNOSIS — E785 Hyperlipidemia, unspecified: Secondary | ICD-10-CM | POA: Diagnosis present

## 2023-04-07 DIAGNOSIS — J101 Influenza due to other identified influenza virus with other respiratory manifestations: Secondary | ICD-10-CM | POA: Diagnosis not present

## 2023-04-07 DIAGNOSIS — J9690 Respiratory failure, unspecified, unspecified whether with hypoxia or hypercapnia: Secondary | ICD-10-CM | POA: Diagnosis not present

## 2023-04-07 DIAGNOSIS — Z7902 Long term (current) use of antithrombotics/antiplatelets: Secondary | ICD-10-CM | POA: Diagnosis not present

## 2023-04-07 DIAGNOSIS — Z91013 Allergy to seafood: Secondary | ICD-10-CM

## 2023-04-07 DIAGNOSIS — Z95828 Presence of other vascular implants and grafts: Secondary | ICD-10-CM | POA: Diagnosis not present

## 2023-04-07 DIAGNOSIS — I959 Hypotension, unspecified: Secondary | ICD-10-CM | POA: Diagnosis not present

## 2023-04-07 DIAGNOSIS — Z794 Long term (current) use of insulin: Secondary | ICD-10-CM | POA: Diagnosis not present

## 2023-04-07 DIAGNOSIS — R0602 Shortness of breath: Secondary | ICD-10-CM | POA: Diagnosis not present

## 2023-04-07 DIAGNOSIS — R0603 Acute respiratory distress: Secondary | ICD-10-CM | POA: Diagnosis present

## 2023-04-07 DIAGNOSIS — B349 Viral infection, unspecified: Secondary | ICD-10-CM | POA: Diagnosis present

## 2023-04-07 DIAGNOSIS — K8020 Calculus of gallbladder without cholecystitis without obstruction: Secondary | ICD-10-CM | POA: Diagnosis not present

## 2023-04-07 DIAGNOSIS — E1129 Type 2 diabetes mellitus with other diabetic kidney complication: Secondary | ICD-10-CM | POA: Diagnosis present

## 2023-04-07 DIAGNOSIS — I495 Sick sinus syndrome: Secondary | ICD-10-CM | POA: Diagnosis present

## 2023-04-07 DIAGNOSIS — R0902 Hypoxemia: Secondary | ICD-10-CM | POA: Diagnosis present

## 2023-04-07 DIAGNOSIS — K760 Fatty (change of) liver, not elsewhere classified: Secondary | ICD-10-CM | POA: Diagnosis not present

## 2023-04-07 DIAGNOSIS — J208 Acute bronchitis due to other specified organisms: Secondary | ICD-10-CM | POA: Diagnosis present

## 2023-04-07 DIAGNOSIS — K828 Other specified diseases of gallbladder: Secondary | ICD-10-CM | POA: Diagnosis not present

## 2023-04-07 DIAGNOSIS — Z79899 Other long term (current) drug therapy: Secondary | ICD-10-CM

## 2023-04-07 DIAGNOSIS — Z955 Presence of coronary angioplasty implant and graft: Secondary | ICD-10-CM

## 2023-04-07 DIAGNOSIS — Z87442 Personal history of urinary calculi: Secondary | ICD-10-CM

## 2023-04-07 LAB — CBC WITH DIFFERENTIAL/PLATELET
Abs Immature Granulocytes: 0.05 10*3/uL (ref 0.00–0.07)
Basophils Absolute: 0 10*3/uL (ref 0.0–0.1)
Basophils Relative: 0 %
Eosinophils Absolute: 0 10*3/uL (ref 0.0–0.5)
Eosinophils Relative: 0 %
HCT: 31.8 % — ABNORMAL LOW (ref 39.0–52.0)
Hemoglobin: 10.1 g/dL — ABNORMAL LOW (ref 13.0–17.0)
Immature Granulocytes: 1 %
Lymphocytes Relative: 13 %
Lymphs Abs: 0.6 10*3/uL — ABNORMAL LOW (ref 0.7–4.0)
MCH: 33.6 pg (ref 26.0–34.0)
MCHC: 31.8 g/dL (ref 30.0–36.0)
MCV: 105.6 fL — ABNORMAL HIGH (ref 80.0–100.0)
Monocytes Absolute: 0.3 10*3/uL (ref 0.1–1.0)
Monocytes Relative: 6 %
Neutro Abs: 4 10*3/uL (ref 1.7–7.7)
Neutrophils Relative %: 80 %
Platelets: 78 10*3/uL — ABNORMAL LOW (ref 150–400)
RBC: 3.01 MIL/uL — ABNORMAL LOW (ref 4.22–5.81)
RDW: 16.3 % — ABNORMAL HIGH (ref 11.5–15.5)
Smear Review: DECREASED
WBC: 5 10*3/uL (ref 4.0–10.5)
nRBC: 0 % (ref 0.0–0.2)

## 2023-04-07 LAB — COMPREHENSIVE METABOLIC PANEL
ALT: 309 U/L — ABNORMAL HIGH (ref 0–44)
AST: 578 U/L — ABNORMAL HIGH (ref 15–41)
Albumin: 3.2 g/dL — ABNORMAL LOW (ref 3.5–5.0)
Alkaline Phosphatase: 63 U/L (ref 38–126)
Anion gap: 20 — ABNORMAL HIGH (ref 5–15)
BUN: 64 mg/dL — ABNORMAL HIGH (ref 8–23)
CO2: 22 mmol/L (ref 22–32)
Calcium: 8.6 mg/dL — ABNORMAL LOW (ref 8.9–10.3)
Chloride: 93 mmol/L — ABNORMAL LOW (ref 98–111)
Creatinine, Ser: 8.4 mg/dL — ABNORMAL HIGH (ref 0.61–1.24)
GFR, Estimated: 6 mL/min — ABNORMAL LOW (ref >60)
Glucose, Bld: 154 mg/dL — ABNORMAL HIGH (ref 70–99)
Potassium: 4.5 mmol/L (ref 3.5–5.1)
Sodium: 135 mmol/L (ref 135–145)
Total Bilirubin: 0.6 mg/dL (ref 0.0–1.2)
Total Protein: 7.2 g/dL (ref 6.5–8.1)

## 2023-04-07 LAB — RESP PANEL BY RT-PCR (RSV, FLU A&B, COVID)  RVPGX2
Influenza A by PCR: NEGATIVE
Influenza B by PCR: NEGATIVE
Resp Syncytial Virus by PCR: POSITIVE — AB
SARS Coronavirus 2 by RT PCR: POSITIVE — AB

## 2023-04-07 LAB — BLOOD GAS, VENOUS
Acid-base deficit: 5.8 mmol/L — ABNORMAL HIGH (ref 0.0–2.0)
Bicarbonate: 19.5 mmol/L — ABNORMAL LOW (ref 20.0–28.0)
Drawn by: 4237
O2 Saturation: 49.7 %
Patient temperature: 36.8
pCO2, Ven: 37 mm[Hg] — ABNORMAL LOW (ref 44–60)
pH, Ven: 7.33 (ref 7.25–7.43)
pO2, Ven: 33 mm[Hg] (ref 32–45)

## 2023-04-07 LAB — FERRITIN: Ferritin: >7500 ng/mL — ABNORMAL HIGH (ref 24–336)

## 2023-04-07 LAB — TROPONIN I (HIGH SENSITIVITY)
Troponin I (High Sensitivity): 7535 ng/L (ref <18)
Troponin I (High Sensitivity): 6396 ng/L (ref <18)
Troponin I (High Sensitivity): 6663 ng/L (ref <18)

## 2023-04-07 LAB — GLUCOSE, CAPILLARY: Glucose-Capillary: 193 mg/dL — ABNORMAL HIGH (ref 70–99)

## 2023-04-07 LAB — MAGNESIUM: Magnesium: 2.3 mg/dL (ref 1.7–2.4)

## 2023-04-07 LAB — D-DIMER, QUANTITATIVE: D-Dimer, Quant: 2.72 ug{FEU}/mL — ABNORMAL HIGH (ref 0.00–0.50)

## 2023-04-07 LAB — CBG MONITORING, ED: Glucose-Capillary: 155 mg/dL — ABNORMAL HIGH (ref 70–99)

## 2023-04-07 LAB — LACTATE DEHYDROGENASE: LDH: 1172 U/L — ABNORMAL HIGH (ref 98–192)

## 2023-04-07 LAB — LACTIC ACID, PLASMA
Lactic Acid, Venous: 4.2 mmol/L (ref 0.5–1.9)
Lactic Acid, Venous: 4.8 mmol/L (ref 0.5–1.9)
Lactic Acid, Venous: 1.9 mmol/L (ref 0.5–1.9)

## 2023-04-07 LAB — BRAIN NATRIURETIC PEPTIDE: B Natriuretic Peptide: 1165 pg/mL — ABNORMAL HIGH (ref 0.0–100.0)

## 2023-04-07 LAB — TSH: TSH: 2.194 u[IU]/mL (ref 0.350–4.500)

## 2023-04-07 MED ORDER — ASPIRIN 81 MG PO TBEC
81.0000 mg | DELAYED_RELEASE_TABLET | Freq: Every morning | ORAL | Status: DC
  Administered 2023-04-08: 81 mg via ORAL
  Filled 2023-04-07: qty 1

## 2023-04-07 MED ORDER — SEVELAMER CARBONATE 800 MG PO TABS
2400.0000 mg | ORAL_TABLET | Freq: Three times a day (TID) | ORAL | Status: DC
  Administered 2023-04-08 (×2): 2400 mg via ORAL
  Filled 2023-04-07 (×2): qty 3

## 2023-04-07 MED ORDER — ONDANSETRON HCL 4 MG PO TABS
4.0000 mg | ORAL_TABLET | Freq: Three times a day (TID) | ORAL | Status: DC | PRN
  Administered 2023-04-07: 4 mg via ORAL
  Filled 2023-04-07: qty 1

## 2023-04-07 MED ORDER — SODIUM CHLORIDE 0.9 % IV BOLUS
500.0000 mL | Freq: Once | INTRAVENOUS | Status: AC
  Administered 2023-04-07: 500 mL via INTRAVENOUS

## 2023-04-07 MED ORDER — HEPARIN BOLUS VIA INFUSION
4000.0000 [IU] | Freq: Once | INTRAVENOUS | Status: AC
  Administered 2023-04-07: 4000 [IU] via INTRAVENOUS

## 2023-04-07 MED ORDER — POLYETHYLENE GLYCOL 3350 17 G PO PACK: 17.0000 g | PACK | Freq: Every day | ORAL | Status: DC | PRN

## 2023-04-07 MED ORDER — INSULIN ASPART 100 UNIT/ML IJ SOLN
0.0000 [IU] | Freq: Every day | INTRAMUSCULAR | Status: DC
  Administered 2023-04-08: 2 [IU] via SUBCUTANEOUS

## 2023-04-07 MED ORDER — IPRATROPIUM-ALBUTEROL 0.5-2.5 (3) MG/3ML IN SOLN: 3.0000 mL | RESPIRATORY_TRACT | Status: DC | PRN

## 2023-04-07 MED ORDER — AMIODARONE LOAD VIA INFUSION: 150.0000 mg | Freq: Once | INTRAVENOUS | Status: DC

## 2023-04-07 MED ORDER — AMIODARONE IV BOLUS ONLY 150 MG/100ML
150.0000 mg | Freq: Once | INTRAVENOUS | Status: AC
  Administered 2023-04-07: 150 mg via INTRAVENOUS
  Filled 2023-04-07: qty 100

## 2023-04-07 MED ORDER — CLOPIDOGREL BISULFATE 75 MG PO TABS
75.0000 mg | ORAL_TABLET | Freq: Every day | ORAL | Status: DC
  Administered 2023-04-08: 75 mg via ORAL
  Filled 2023-04-07: qty 1

## 2023-04-07 MED ORDER — INSULIN ASPART 100 UNIT/ML IJ SOLN
0.0000 [IU] | Freq: Three times a day (TID) | INTRAMUSCULAR | Status: DC
  Administered 2023-04-08: 1 [IU] via SUBCUTANEOUS
  Administered 2023-04-08: 2 [IU] via SUBCUTANEOUS

## 2023-04-07 MED ORDER — GUAIFENESIN-DM 100-10 MG/5ML PO SYRP
15.0000 mL | ORAL_SOLUTION | Freq: Three times a day (TID) | ORAL | Status: AC
  Administered 2023-04-07 – 2023-04-08 (×3): 15 mL via ORAL
  Filled 2023-04-07 (×3): qty 15

## 2023-04-07 MED ORDER — HEPARIN (PORCINE) 25000 UT/250ML-% IV SOLN
1200.0000 [IU]/h | INTRAVENOUS | Status: DC
  Administered 2023-04-07: 1200 [IU]/h via INTRAVENOUS
  Filled 2023-04-07: qty 250

## 2023-04-07 MED ORDER — IPRATROPIUM-ALBUTEROL 0.5-2.5 (3) MG/3ML IN SOLN
3.0000 mL | Freq: Three times a day (TID) | RESPIRATORY_TRACT | Status: AC
  Administered 2023-04-07 – 2023-04-08 (×2): 3 mL via RESPIRATORY_TRACT
  Filled 2023-04-07 (×2): qty 3

## 2023-04-07 MED ORDER — ONDANSETRON HCL 4 MG/2ML IJ SOLN
4.0000 mg | Freq: Three times a day (TID) | INTRAMUSCULAR | Status: DC | PRN
  Administered 2023-04-08: 4 mg via INTRAVENOUS
  Filled 2023-04-07 (×2): qty 2

## 2023-04-07 NOTE — Assessment & Plan Note (Signed)
Presenting with hypotension. -Hold Cardizem for now

## 2023-04-07 NOTE — Assessment & Plan Note (Addendum)
COVID and RSV infection.  Presenting with severe dyspnea, cough.  Hypoxia not mentioned or documented, initial sats 92% on room air.  Chest x-ray negative for acute abnormality.  VBG WNL- with pH of 7.3, pCO2 of 37.  Was placed on BiPAP due to increased work of breathing- with significant improvement in respiratory status.  Likely viral bronchitis.  Not tachycardic, no lower extremity swelling to suggest PE. Never smoked cigarettes. -DuoNebs as needed and scheduled -Symptoms of > 2 weeks, outside window for benefit from Paxlovid -D-dimer, ferritin, LDH, CRP -Initial hypotension and dyspnea, with elevated liver enzymes, obtain CTA chest and abdomen.

## 2023-04-07 NOTE — ED Notes (Signed)
RT called to bedside to place pt on bipap.

## 2023-04-07 NOTE — Assessment & Plan Note (Signed)
Schedule Tuesday Thursday Saturday.  Last HD was yesterday. -Consult nephrology for HD

## 2023-04-07 NOTE — ED Triage Notes (Signed)
Pt reports he "feels like he's dying." He reports productive cough, weakness, loss of appetite, shortness of breath. Pt is a dialysis patient t/th/sat. Dialysis was completed yesterday. Spouse states dialysis staff told him he needed to be in hospital. Pt was tested last week for covid, flu, rsv and tested negative, however spouse tested positive for covid,

## 2023-04-07 NOTE — Assessment & Plan Note (Signed)
Presenting with tachypnea-rate up to 34, hypotension.

## 2023-04-07 NOTE — Consult Note (Signed)
CARDIOLOGY CONSULT NOTE    Patient ID: Bradley Soto; [id number]; [birth year]   Admit date: 04/07/2023 Date of Consult: 04/07/2023  Primary Care Provider: Alysia Penna, MD Primary Cardiologist:  Primary Electrophysiologist:     History of Present Illness:   Mr. Bradley Soto is a 25 M known to have CAD s/p multiple PCI, 4V CABG in 2020 (free LIMA to mid LAD, SVG to diagonal, SVG sequentially to OM and ramus intermedius), left-sided maze and left atrial clip (size 40), severe aortic valve stenosis in 08/2022 s/p TAVR valve (26 mm Edwards SAPIEN 3 Resilia), ESRD DD, sick sinus syndrome s/p PPM, OSA on CPAP, HTN, DM 2, HLD presented to ER with DOE x 3 weeks that worsened in the last few days.  He said he was not able to breathe and hence came to the ER.  Denied having any chest pain.  No dizziness or syncope.  No leg swelling. patient was tripoding upon arrival to the ER and using his accessory muscles of respiration.  He had multiple runs of NSVT on the telemetry, pacemaker interrogation also confirmed >100 runs of NSVT.  Likely from hypoxia.  After he was placed on BiPAP, his breathing significantly improved and there were no more NSVT runs.  Chest x-ray showed pulmonary vascular congestion, personally reviewed.  Tested positive for COVID-19 virus, RSV and influenza A.  Labs remarkable for platelets 78, hemoglobin 10.1, serum creatinine 8.40, Hs troponins 7535, BNP 1165, lactic acid 4.2.  Serum potassium 4.5 and serum magnesium 2.3.  EKG showed atrial and ventricular dual paced rhythm.  Past Medical History:  Diagnosis Date   Anxiety    Atrial fibrillation (HCC)    Bell's palsy    left side of face droops   CAD (coronary artery disease)    s/p stent placement   Colon polyps    ESRD (end stage renal disease) (HCC)    [place]- [facility]- [facility]   Gout    History of blood transfusion    History of kidney stones    Passes   HLD (hyperlipidemia)    HTN (hypertension)    Insomnia    MI  (myocardial infarction) (HCC) 1998   Neuropathy    NSTEMI (non-ST elevated myocardial infarction) (HCC)    s/p PCI in early 2000s   OSA on CPAP    wears CPAP   Presence of permanent cardiac pacemaker    S/P TAVR (transcatheter aortic valve replacement) 08/25/2022   s/p TAVR with a 26 mm Edwards S3UR via the TF approach by Dr. Lynnette Caffey & Dr. Laneta Simmers   Severe aortic stenosis    Type II or unspecified type diabetes mellitus without mention of complication, not stated as uncontrolled    Wears glasses    reading    Wears partial dentures     Past Surgical History:  Procedure Laterality Date   APPENDECTOMY     AV FISTULA PLACEMENT Right 07/28/2019   Procedure: RIGHT ARM Brachiocephalic ARTERIOVENOUS (AV) FISTULA CREATION;  Surgeon: Nada Libman, MD;  Location: [facility] OR;  Service: Vascular;  Laterality: Right;   CATARACT EXTRACTION     CLIPPING OF ATRIAL APPENDAGE  12/05/2018   Procedure: Clipping Of Atrial Appendage;  Surgeon: Linden Dolin, MD;  Location: [facility] OR;  Service: Open Heart Surgery;;   CORONARY ANGIOPLASTY WITH STENT PLACEMENT     x4   CORONARY ARTERY BYPASS GRAFT N/A 12/05/2018   Procedure: CORONARY ARTERY BYPASS GRAFTING (CABG) X FOUR USING LEFT INTERNAL MAMMARY ARTERY AND RIGHT SAPHENOUS  VEIN GRAFTS;  Surgeon: Linden Dolin, MD;  Location: [facility] OR;  Service: Open Heart Surgery;  Laterality: N/A;   CORONARY STENT INTERVENTION N/A 01/27/2023   Procedure: CORONARY STENT INTERVENTION;  Surgeon: Marykay Lex, MD;  Location: [facility] INVASIVE CV LAB;  Service: Cardiovascular;  Laterality: N/A;   HERNIA REPAIR     ventral hernia repair with mesh--revision also was required   INSERT / REPLACE / REMOVE PACEMAKER  05/10/2000   pacemaker implanted in [place]]   INTRAOPERATIVE TRANSTHORACIC ECHOCARDIOGRAM N/A 08/25/2022   Procedure: INTRAOPERATIVE TRANSTHORACIC ECHOCARDIOGRAM;  Surgeon: Orbie Pyo, MD;  Location: [facility] INVASIVE CV LAB;  Service: Open Heart Surgery;  Laterality: N/A;    IR THORACENTESIS ASP PLEURAL SPACE W/IMG GUIDE  12/21/2018   IR THORACENTESIS ASP PLEURAL SPACE W/IMG GUIDE  12/30/2018   LEFT HEART CATH AND CORONARY ANGIOGRAPHY N/A 11/30/2018   Procedure: LEFT HEART CATH AND CORONARY ANGIOGRAPHY;  Surgeon: Kathleene Hazel, MD;  Location: [facility] INVASIVE CV LAB;  Service: Cardiovascular;  Laterality: N/A;   LEFT HEART CATH AND CORS/GRAFTS ANGIOGRAPHY N/A 01/27/2023   Procedure: LEFT HEART CATH AND CORS/GRAFTS ANGIOGRAPHY;  Surgeon: Marykay Lex, MD;  Location: [facility] INVASIVE CV LAB;  Service: Cardiovascular;  Laterality: N/A;   MAZE N/A 12/05/2018   Procedure: MAZE;  Surgeon: Linden Dolin, MD;  Location: [facility] OR;  Service: Open Heart Surgery;  Laterality: N/A;   PACEMAKER GENERATOR CHANGE  09/03/09   MDT Adapta generator change in [place]   PARTIAL COLON REMOVAL  2/2   polyp that could not be removed on colonoscopy   PPM GENERATOR CHANGEOUT N/A 06/01/2019   Procedure: PPM GENERATOR CHANGEOUT;  Surgeon: Hillis Range, MD;  Location: [facility] INVASIVE CV LAB;  Service: Cardiovascular;  Laterality: N/A;   REVISON OF ARTERIOVENOUS FISTULA Right 01/24/2020   Procedure: BANDING OF RIGHT ARM ARTERIOVENOUS FISTULA;  Surgeon: Nada Libman, MD;  Location: [facility] OR;  Service: Vascular;  Laterality: Right;   RIGHT/LEFT HEART CATH AND CORONARY/GRAFT ANGIOGRAPHY N/A 07/31/2022   Procedure: RIGHT/LEFT HEART CATH AND CORONARY/GRAFT ANGIOGRAPHY;  Surgeon: Marykay Lex, MD;  Location: [facility] INVASIVE CV LAB;  Service: Cardiovascular;  Laterality: N/A;   TEE WITHOUT CARDIOVERSION N/A 12/05/2018   Procedure: TRANSESOPHAGEAL ECHOCARDIOGRAM (TEE);  Surgeon: Linden Dolin, MD;  Location: [facility] OR;  Service: Open Heart Surgery;  Laterality: N/A;   TRANSCATHETER AORTIC VALVE REPLACEMENT, TRANSFEMORAL N/A 08/25/2022   Procedure: Transcatheter Aortic Valve Replacement, Transfemoral;  Surgeon: Orbie Pyo, MD;  Location: [facility] INVASIVE CV LAB;  Service: Open Heart Surgery;  Laterality:  N/A;       Inpatient Medications: Scheduled Meds:  Continuous Infusions:  heparin 1,200 Units/hr (04/07/23 1417)   PRN Meds:   Allergies:    Allergies  Allergen Reactions   Shellfish Allergy Rash and Other (See Comments)    Social History:   Social History   Socioeconomic History   Marital status: Married    Spouse name: Not on file   Number of children: Not on file   Years of education: Not on file   Highest education level: Not on file  Occupational History   Not on file  Tobacco Use   Smoking status: Former    Types: Cigarettes   Smokeless tobacco: Never   Tobacco comments:    " many years ago"  Vaping Use   Vaping status: Never Used  Substance and Sexual Activity   Alcohol use: No   Drug use: No   Sexual activity: Not on  file  Other Topics Concern   Not on file  Social History Narrative   Lives with wife   Caffeine coffee 2 daily   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   Social Drivers of Corporate investment banker Strain: Not on file  Food Insecurity: No Food Insecurity (01/28/2023)   Hunger Vital Sign    Worried About Running Out of Food in the Last Year: Never true    Ran Out of Food in the Last Year: Never true  Transportation Needs: No Transportation Needs (01/28/2023)   PRAPARE - Administrator, Civil Service (Medical): No    Lack of Transportation (Non-Medical): No  Physical Activity: Not on file  Stress: Not on file  Social Connections: Not on file  Intimate Partner Violence: Unknown (01/28/2023)   Humiliation, Afraid, Rape, and Kick questionnaire    Fear of Current or Ex-Partner: Patient declined    Emotionally Abused: No    Physically Abused: No    Sexually Abused: No    Family History:    Family History  Problem Relation Age of Onset   Heart attack Father 73   Heart disease Father    Breast cancer Sister    Pulmonary embolism Sister    Deep vein thrombosis Sister      ROS:  Please see the history of  present illness.  ROS  All other ROS reviewed and negative.     Physical Exam/Data:   Vitals:   04/07/23 1305 04/07/23 1315 04/07/23 1320 04/07/23 1330  BP: (!) 103/49 (!) 92/49  (!) 93/47  Pulse: 82 74  76  Resp: (!) 22 13  (!) 22  Temp:      TempSrc:      SpO2: 100% 100% 100% 100%  Weight:      Height:        Intake/Output Summary (Last 24 hours) at 04/07/2023 1437 Last data filed at 04/07/2023 1323 Gross per 24 hour  Intake 597.91 ml  Output --  Net 597.91 ml   Filed Weights   04/07/23 1138  Weight: 103.4 kg   Body mass index is 31.8 kg/m.  General:  Well nourished, well developed, in no acute distress HEENT: normal Lymph: no adenopathy Neck: JVD unable to examine due to body habitus Endocrine:  No thryomegaly Vascular: No carotid bruits; FA pulses 2+ bilaterally without bruits  Cardiac:  normal S1, S2; RRR; no murmur  Lungs:  clear to auscultation bilaterally, no wheezing, rhonchi or rales  Abd: soft, nontender, no hepatomegaly  Ext: no edema Musculoskeletal:  No deformities, BUE and BLE strength normal and equal Skin: warm and dry  Neuro:  CNs 2-12 intact, no focal abnormalities noted Psych:  Normal affect    Laboratory Data:  Chemistry Recent Labs  Lab 04/07/23 1206  NA 135  K 4.5  CL 93*  CO2 22  GLUCOSE 154*  BUN 64*  CREATININE 8.40*  CALCIUM 8.6*  GFRNONAA 6*  ANIONGAP 20*    Recent Labs  Lab 04/07/23 1206  PROT 7.2  ALBUMIN 3.2*  AST 578*  ALT 309*  ALKPHOS 63  BILITOT 0.6   Hematology Recent Labs  Lab 04/07/23 1206  WBC 5.0  RBC 3.01*  HGB 10.1*  HCT 31.8*  MCV 105.6*  MCH 33.6  MCHC 31.8  RDW 16.3*  PLT 78*   Cardiac EnzymesNo results for input(s): "TROPONINI" in the last 168 hours. No results for input(s): "TROPIPOC" in the last 168 hours.  BNP Recent Labs  Lab 04/07/23 1206  BNP 1,165.0*    DDimer No results for input(s): "DDIMER" in the last 168 hours.  Radiology/Studies:  DG Chest Port 1 View Result  Date: 04/07/2023 CLINICAL DATA:  Productive cough, weakness and shortness of breath. EXAM: PORTABLE CHEST 1 VIEW COMPARISON:  10/23/2022.  Chest CT dated 09/14/2022. FINDINGS: Stable enlarged cardiac silhouette and calcified thoracic aortic arch. Stable left subclavian pacemaker leads. Stable pleural and parenchymal scarring in the left lower lung zone and small amount of parenchymal scarring at the right lateral lung base. Stable aortic stent valve. Stable post CABG changes and left atrial clip. Previously noted old right scapular fracture. IMPRESSION: 1. No acute abnormality. 2. Stable cardiomegaly. Electronically Signed   By: Beckie Salts M.D.   On: 04/07/2023 12:28    Assessment and Plan:   Acute on chronic diastolic heart failure, currently on BiPAP Presented with DOE x 2 to 3 weeks that worsened in the last few days.  Associated with cough and fatigue. No chest pain.  Tripoding upon arrival to the ER but currently appears to be comfortable on BiPAP.  Laying flat in the bed.  No leg swelling.  JVD unable to examine due to body habitus.  Chest x-ray reviewed, showed mild pulmonary vascular congestion.  BNP elevated, 1,165. Underwent dialysis yesterday, can wait till tomorrow. Obtain 2D echocardiogram.  Prior echocardiogram from August 2024 showed LVEF 50%.    Multiple runs of NSVT likely secondary to ?  Hypoxia Patient had multiple runs of NSVT on the telemetry and this was also confirmed by device interrogation today (more than 100 episodes of NSVT). Likely secondary to volume overload or/and hypoxia.  After placing on BiPAP, no more runs of NSVT are noted.  Will monitor.  Start beta-blocker if BP stable.    Troponin elevation likely secondary to demand ischemia from sepsis EKG showed dual paced rhythm and no ischemia. Hs troponins 7,535.  No chest pain.  Likely demand ischemia from sepsis due to COVID-19, RSV and influenza A. Will transfer him to [facility] under hospitalist service. If troponin  significantly trend up, he probably might benefit from repeat LHC as he had recent PCI in December 2024.   Thrombocytopenia, new likely from sepsis: Will monitor.  Continue DAPT for now.  Sepsis secondary to COVID-19 infection, RSV and influenza A Management per primary team.  Lactic acidosis likely secondary to sepsis Management per primary team.  CAD s/p multiple PCI, 4V CABG in 2020 (free LIMA to mid LAD, SVG to diagonal, SVG sequentially to OM and ramus intermedius), progressive angina in 12/24  s/p PCI to LIMA to mid LAD, angina free Recent PCI in December 2024.  Continue DAPT, aspirin 81 mg once daily and Plavix 75 mg once daily.  Atrial fibrillation s/p left-sided maze and left atrial clip in 2020: No indication to be on systemic AC.  Severe AS s/p TAVR in 2024: Normal echo post-TAVR.  Sick sinus syndrome s/p PPM: Pacemaker interrogation revealed more than 100 episodes of NSVT.  ESRD DD: Continue follow-up with nephrology for dialysis.  OSA on CPAP: Currently on BiPAP.  HTN, DM 2, HLD: Hold antihypertensive medications, DM2 management per primary.  Continue statins.   For questions or updates, please contact [facility] Please consult [email or web address] for contact info under Cardiology/STEMI.   Signed, Herbert Deaner, MD, [facility] 04/07/2023 2:37 PM

## 2023-04-07 NOTE — Assessment & Plan Note (Addendum)
Transaminitis, with AST of 578, ALT of 309.  Normal T. bili 0.6 and ALP 63.  Abdomen is benign.  Likely secondary to poor perfusion from hypotension, acute viral infection COVID and RSV.  Last abdominal imaging 07/2022 showed suspicious liver lesion. -Trend liver enzymes -Hepatitis panel - RUQ [place]

## 2023-04-07 NOTE — Assessment & Plan Note (Signed)
Platelets of 78, baseline over the past year - 78-158.  Likely due to viral infections COVID and RSV. -Trend for now -Resuming DAPT , but avoiding anticoagulants

## 2023-04-07 NOTE — ED Provider Notes (Addendum)
[place] EMERGENCY DEPARTMENT AT [facility] Provider Note  CSN: [id number] Arrival date & time: 04/07/23 1116  Chief Complaint(s) flu like symptoms  HPI Bradley Soto is a 83 y.o. male with past medical history as below, significant for atrial fibrillation, CAD, ESRD TTS, HLD, HTN, OSA on CPAP, status post TAVR, aortic stenosis, type II DM who presents to the ED with complaint of DIB  Dr Elease Hashimoto cardiologist   Patient reports feeling short of breath over the past 2 to 3 weeks, worse in the past 2 days.  Palpitations intermittently. Generalized weakness. ESRD TTS, no missed sessions last 2 weeks, last dialysis was yesterday and received full session.  Denies fevers, chills, nausea or vomiting.  He has been compliant with home medications.  Abdominal pain or change to stool output. Spouse has covid.   Past Medical History Past Medical History:  Diagnosis Date   Anxiety    Atrial fibrillation (HCC)    Bell's palsy    left side of face droops   CAD (coronary artery disease)    s/p stent placement   Colon polyps    ESRD (end stage renal disease) (HCC)    [place]- [facility]- [facility]   Gout    History of blood transfusion    History of kidney stones    Passes   HLD (hyperlipidemia)    HTN (hypertension)    Insomnia    MI (myocardial infarction) (HCC) 1998   Neuropathy    NSTEMI (non-ST elevated myocardial infarction) (HCC)    s/p PCI in early 2000s   OSA on CPAP    wears CPAP   Presence of permanent cardiac pacemaker    S/P TAVR (transcatheter aortic valve replacement) 08/25/2022   s/p TAVR with a 26 mm Edwards S3UR via the TF approach by Dr. Lynnette Caffey & Dr. Laneta Simmers   Severe aortic stenosis    Type II or unspecified type diabetes mellitus without mention of complication, not stated as uncontrolled    Wears glasses    reading    Wears partial dentures    Patient Active Problem List   Diagnosis Date Noted   Progressive angina (HCC) 01/27/2023   Presence of  permanent cardiac pacemaker 08/25/2022   Severe aortic stenosis 08/25/2022   S/P TAVR (transcatheter aortic valve replacement) 08/25/2022   Anemia 10/22/2021   ESRD (end stage renal disease) (HCC) 11/01/2019   S/P CABG x 4    HTN (hypertension) 03/11/2017   HLD (hyperlipidemia) 03/10/2017   OSA on CPAP 03/10/2017   Type II diabetes mellitus with renal manifestations (HCC) 03/10/2017   Atrial fibrillation (HCC) 10/10/2013   Gout 10/10/2013   Home Medication(s) Prior to Admission medications   Medication Sig Start Date End Date Taking? Authorizing Provider  amoxicillin (AMOXIL) 500 MG tablet Take 4 tablets (2,000 mg total) by mouth as directed. 1 hour prior to dental work including cleanings 09/02/22   Janetta Hora, PA-C  aspirin EC 81 MG tablet Take 81 mg by mouth in the morning.    [provider]  AURYXIA 1 GM 210 MG(Fe) tablet Ferric Citrate 210 mg 03/25/23 03/24/24  [provider]  B Complex-C-Zn-Folic Acid (DIALYVITE 800 WITH ZINC) 0.8 MG TABS Take 1 tablet by mouth at bedtime. 11/08/19   [provider]  clopidogrel (PLAVIX) 75 MG tablet Take 1 tablet (75 mg total) by mouth daily with breakfast. 01/28/23   Marykay Lex, MD  Cupric Glycinate (COPPER [place]) POWD Take 2 capsules by mouth in the morning.  [provider]  Cyanocobalamin (VITAMIN B-12 PO) Take 1 capsule by mouth in the morning.    [provider]  diclofenac Sodium (VOLTAREN) 1 % GEL Apply 1 Application topically 4 (four) times daily as needed (neck pain).    [provider]  diltiazem (CARDIZEM SR) 60 MG 12 hr capsule Take 1 capsule (60 mg total) by mouth as needed (palpitations). 05/22/22   Sharlene Dory, PA-C  diltiazem (CARDIZEM SR) 60 MG 12 hr capsule 1 capsule Orally Twice a day As needed    [provider]  diphenhydrAMINE HCl, Sleep, (ZZZQUIL PO) Take 1-2 tablets by mouth at bedtime. PureZzzs Sleep Supplement (melatonin/chamomile/lavender)     [provider]  diphenhydramine-acetaminophen (TYLENOL PM) 25-500 MG TABS tablet Take 1 tablet by mouth at bedtime.    [provider]  gabapentin (NEURONTIN) 100 MG capsule Take 3 capsules (300 mg total) by mouth 2 (two) times daily. 01/28/23   Marykay Lex, MD  Methoxy PEG-Epoetin Beta (MIRCERA IJ) Mircera 05/23/22 05/22/23  [provider]  Methoxy PEG-Epoetin Beta (MIRCERA IJ) Mircera 06/09/22 06/08/23  [provider]  nitroGLYCERIN (NITROSTAT) 0.4 MG SL tablet Place 1 tablet (0.4 mg total) under the tongue every 5 (five) minutes as needed for chest pain. 05/22/22   Sharlene Dory, PA-C  NOVOLOG MIX 70/30 FLEXPEN (70-30) 100 UNIT/ML FlexPen Inject 30 Units into the skin in the morning and at bedtime. 02/26/16   [provider]  sevelamer carbonate (RENVELA) 800 MG tablet Take 3 tablets (2,400 mg total) by mouth 3 (three) times daily with meals. 01/28/23   Marykay Lex, MD  triamcinolone cream (KENALOG) 0.5 % 1 application External Twice a day as needed for rash for 30 day(s) 03/12/23   [provider]  VELPHORO 500 MG chewable tablet CRUSH OR CHEW AND SWALLOW 1 TABLET 3 TIMES A DAY WITH MEALS AND 1 TABLET TWICE A DAY WITH SNACKS Oral for 30 Days 02/23/23   [provider]                                                                                                                                    Past Surgical History Past Surgical History:  Procedure Laterality Date   APPENDECTOMY     AV FISTULA PLACEMENT Right 07/28/2019   Procedure: RIGHT ARM Brachiocephalic ARTERIOVENOUS (AV) FISTULA CREATION;  Surgeon: Nada Libman, MD;  Location: [facility] OR;  Service: Vascular;  Laterality: Right;   CATARACT EXTRACTION     CLIPPING OF ATRIAL APPENDAGE  12/05/2018   Procedure: Clipping Of Atrial Appendage;  Surgeon: Linden Dolin, MD;  Location: [facility] OR;  Service: Open Heart Surgery;;   CORONARY ANGIOPLASTY WITH STENT PLACEMENT     x4    CORONARY ARTERY BYPASS GRAFT N/A 12/05/2018   Procedure: CORONARY ARTERY BYPASS GRAFTING (CABG) X FOUR USING LEFT INTERNAL MAMMARY ARTERY AND RIGHT SAPHENOUS VEIN GRAFTS;  Surgeon:  Linden Dolin, MD;  Location: [facility] OR;  Service: Open Heart Surgery;  Laterality: N/A;   CORONARY STENT INTERVENTION N/A 01/27/2023   Procedure: CORONARY STENT INTERVENTION;  Surgeon: Marykay Lex, MD;  Location: [facility] INVASIVE CV LAB;  Service: Cardiovascular;  Laterality: N/A;   HERNIA REPAIR     ventral hernia repair with mesh--revision also was required   INSERT / REPLACE / REMOVE PACEMAKER  05/10/2000   pacemaker implanted in [place]]   INTRAOPERATIVE TRANSTHORACIC ECHOCARDIOGRAM N/A 08/25/2022   Procedure: INTRAOPERATIVE TRANSTHORACIC ECHOCARDIOGRAM;  Surgeon: Orbie Pyo, MD;  Location: [facility] INVASIVE CV LAB;  Service: Open Heart Surgery;  Laterality: N/A;   IR THORACENTESIS ASP PLEURAL SPACE W/IMG GUIDE  12/21/2018   IR THORACENTESIS ASP PLEURAL SPACE W/IMG GUIDE  12/30/2018   LEFT HEART CATH AND CORONARY ANGIOGRAPHY N/A 11/30/2018   Procedure: LEFT HEART CATH AND CORONARY ANGIOGRAPHY;  Surgeon: Kathleene Hazel, MD;  Location: [facility] INVASIVE CV LAB;  Service: Cardiovascular;  Laterality: N/A;   LEFT HEART CATH AND CORS/GRAFTS ANGIOGRAPHY N/A 01/27/2023   Procedure: LEFT HEART CATH AND CORS/GRAFTS ANGIOGRAPHY;  Surgeon: Marykay Lex, MD;  Location: [facility] INVASIVE CV LAB;  Service: Cardiovascular;  Laterality: N/A;   MAZE N/A 12/05/2018   Procedure: MAZE;  Surgeon: Linden Dolin, MD;  Location: [facility] OR;  Service: Open Heart Surgery;  Laterality: N/A;   PACEMAKER GENERATOR CHANGE  09/03/09   MDT Adapta generator change in [place]   PARTIAL COLON REMOVAL  2/2   polyp that could not be removed on colonoscopy   PPM GENERATOR CHANGEOUT N/A 06/01/2019   Procedure: PPM GENERATOR CHANGEOUT;  Surgeon: Hillis Range, MD;  Location: [facility] INVASIVE CV LAB;  Service: Cardiovascular;  Laterality: N/A;   REVISON OF  ARTERIOVENOUS FISTULA Right 01/24/2020   Procedure: BANDING OF RIGHT ARM ARTERIOVENOUS FISTULA;  Surgeon: Nada Libman, MD;  Location: [facility] OR;  Service: Vascular;  Laterality: Right;   RIGHT/LEFT HEART CATH AND CORONARY/GRAFT ANGIOGRAPHY N/A 07/31/2022   Procedure: RIGHT/LEFT HEART CATH AND CORONARY/GRAFT ANGIOGRAPHY;  Surgeon: Marykay Lex, MD;  Location: [facility] INVASIVE CV LAB;  Service: Cardiovascular;  Laterality: N/A;   TEE WITHOUT CARDIOVERSION N/A 12/05/2018   Procedure: TRANSESOPHAGEAL ECHOCARDIOGRAM (TEE);  Surgeon: Linden Dolin, MD;  Location: [facility] OR;  Service: Open Heart Surgery;  Laterality: N/A;   TRANSCATHETER AORTIC VALVE REPLACEMENT, TRANSFEMORAL N/A 08/25/2022   Procedure: Transcatheter Aortic Valve Replacement, Transfemoral;  Surgeon: Orbie Pyo, MD;  Location: [facility] INVASIVE CV LAB;  Service: Open Heart Surgery;  Laterality: N/A;   Family History Family History  Problem Relation Age of Onset   Heart attack Father 69   Heart disease Father    Breast cancer Sister    Pulmonary embolism Sister    Deep vein thrombosis Sister     Social History Social History   Tobacco Use   Smoking status: Former    Types: Cigarettes   Smokeless tobacco: Never   Tobacco comments:    " many years ago"  Vaping Use   Vaping status: Never Used  Substance Use Topics   Alcohol use: No   Drug use: No   Allergies Shellfish allergy  Review of Systems A thorough review of systems was obtained and all systems are negative except as noted in the HPI and PMH.   Physical Exam Vital Signs  I have reviewed the triage vital signs BP (!) 134/52   Pulse 81   Temp 98.3 F (36.8 C) (Oral)   Resp  20   Ht 5\' 11"  (1.803 m)   Wt 103.4 kg   SpO2 100%   BMI 31.80 kg/m  Physical Exam Vitals and nursing note reviewed.  Constitutional:      General: He is in acute distress.     Appearance: He is well-developed. He is obese. He is ill-appearing.  HENT:     Head: Normocephalic and  atraumatic.     Right Ear: External ear normal.     Left Ear: External ear normal.     Mouth/Throat:     Mouth: Mucous membranes are moist.  Eyes:     General: No scleral icterus. Cardiovascular:     Rate and Rhythm: Tachycardia present.     Pulses: Normal pulses.     Heart sounds: Normal heart sounds.  Pulmonary:     Effort: Tachypnea, accessory muscle usage and respiratory distress present.     Breath sounds: Decreased breath sounds present.  Abdominal:     General: Abdomen is flat.     Palpations: Abdomen is soft.     Tenderness: There is no abdominal tenderness.  Musculoskeletal:     Cervical back: No rigidity.     Right lower leg: No edema.     Left lower leg: No edema.  Skin:    General: Skin is warm and dry.     Capillary Refill: Capillary refill takes less than 2 seconds.     Coloration: Skin is not jaundiced.     Comments: Lue fistula, palpable thrill  Neurological:     Mental Status: He is alert and oriented to person, place, and time.  Psychiatric:        Mood and Affect: Mood normal.        Behavior: Behavior normal.     ED Results and Treatments Labs (all labs ordered are listed, but only abnormal results are displayed) Labs Reviewed  RESP PANEL BY RT-PCR (RSV, FLU A&B, COVID)  RVPGX2 - Abnormal; Notable for the following components:      Result Value   SARS Coronavirus 2 by RT PCR POSITIVE (*)    Resp Syncytial Virus by PCR POSITIVE (*)    All other components within normal limits  LACTIC ACID, PLASMA - Abnormal; Notable for the following components:   Lactic Acid, Venous 4.2 (*)    All other components within normal limits  LACTIC ACID, PLASMA - Abnormal; Notable for the following components:   Lactic Acid, Venous 4.8 (*)    All other components within normal limits  COMPREHENSIVE METABOLIC PANEL - Abnormal; Notable for the following components:   Chloride 93 (*)    Glucose, Bld 154 (*)    BUN 64 (*)    Creatinine, Ser 8.40 (*)    Calcium 8.6 (*)     Albumin 3.2 (*)    AST 578 (*)    ALT 309 (*)    GFR, Estimated 6 (*)    Anion gap 20 (*)    All other components within normal limits  CBC WITH DIFFERENTIAL/PLATELET - Abnormal; Notable for the following components:   RBC 3.01 (*)    Hemoglobin 10.1 (*)    HCT 31.8 (*)    MCV 105.6 (*)    RDW 16.3 (*)    Platelets 78 (*)    Lymphs Abs 0.6 (*)    All other components within normal limits  BLOOD GAS, VENOUS - Abnormal; Notable for the following components:   pCO2, Ven 37 (*)    Bicarbonate 19.5 (*)  Acid-base deficit 5.8 (*)    All other components within normal limits  BRAIN NATRIURETIC PEPTIDE - Abnormal; Notable for the following components:   B Natriuretic Peptide 1,165.0 (*)    All other components within normal limits  CBG MONITORING, ED - Abnormal; Notable for the following components:   Glucose-Capillary 155 (*)    All other components within normal limits  TROPONIN I (HIGH SENSITIVITY) - Abnormal; Notable for the following components:   Troponin I (High Sensitivity) 7,535 (*)    All other components within normal limits  TROPONIN I (HIGH SENSITIVITY) - Abnormal; Notable for the following components:   Troponin I (High Sensitivity) 6,396 (*)    All other components within normal limits  MAGNESIUM  URINALYSIS, W/ REFLEX TO CULTURE (INFECTION SUSPECTED)  HEPARIN LEVEL (UNFRACTIONATED)                                                                                                                          Radiology DG Chest Port 1 View Result Date: 04/07/2023 CLINICAL DATA:  Productive cough, weakness and shortness of breath. EXAM: PORTABLE CHEST 1 VIEW COMPARISON:  10/23/2022.  Chest CT dated 09/14/2022. FINDINGS: Stable enlarged cardiac silhouette and calcified thoracic aortic arch. Stable left subclavian pacemaker leads. Stable pleural and parenchymal scarring in the left lower lung zone and small amount of parenchymal scarring at the right lateral lung base. Stable  aortic stent valve. Stable post CABG changes and left atrial clip. Previously noted old right scapular fracture. IMPRESSION: 1. No acute abnormality. 2. Stable cardiomegaly. Electronically Signed   By: Beckie Salts M.D.   On: 04/07/2023 12:28    Pertinent labs & imaging results that were available during my care of the patient were reviewed by me and considered in my medical decision making (see MDM for details).  Medications Ordered in ED Medications  heparin ADULT infusion 100 units/mL (25000 units/226mL) (1,200 Units/hr Intravenous New Bag/Given 04/07/23 1417)  sodium chloride 0.9 % bolus 500 mL (0 mLs Intravenous Stopped 04/07/23 1323)  amiodarone (NEXTERONE) 1.5 mg/mL IV bolus only 150 mg (0 mg Intravenous Stopped 04/07/23 1234)  sodium chloride 0.9 % bolus 500 mL (500 mLs Intravenous New Bag/Given 04/07/23 1323)  heparin bolus via infusion 4,000 Units (4,000 Units Intravenous Bolus from Bag 04/07/23 1413)  Procedures .Critical Care  Performed by: Sloan Leiter, DO Authorized by: Sloan Leiter, DO   Critical care provider statement:    Critical care time (minutes):  90   Critical care time was exclusive of:  Separately billable procedures and treating other patients   Critical care was necessary to treat or prevent imminent or life-threatening deterioration of the following conditions:  Cardiac failure and respiratory failure   Critical care was time spent personally by me on the following activities:  Development of treatment plan with patient or surrogate, discussions with consultants, evaluation of patient's response to treatment, examination of patient, ordering and review of laboratory studies, ordering and review of radiographic studies, ordering and performing treatments and interventions, pulse oximetry, re-evaluation of patient's condition, review of old  charts and obtaining history from patient or surrogate   Care discussed with: admitting provider     (including critical care time)  Medical Decision Making / ED Course    Medical Decision Making:    Marvens Hollars is a 83 y.o. male with past medical history as below, significant for atrial fibrillation, CAD, ESRD TTS, HLD, HTN, OSA on CPAP, status post TAVR, aortic stenosis, type II DM who presents to the ED with complaint of DIB. The complaint involves an extensive differential diagnosis and also carries with it a high risk of complications and morbidity.  Serious etiology was considered. Ddx includes but is not limited to: In my evaluation of this patient's dyspnea my DDx includes, but is not limited to, pneumonia, pulmonary embolism, pneumothorax, pulmonary edema, metabolic acidosis, asthma, COPD, cardiac cause, anemia, anxiety, etc.    Complete initial physical exam performed, notably the patient was in acute distress, hypotensive, ventricular tachycardia on telemetry, increased work of breathing Reviewed and confirmed nursing documentation for past medical history, family history, social history.  Vital signs reviewed.   Clinical Course as of 04/07/23 1618  Wed Apr 07, 2023  1217 Ventricular tachycardia noted on telemetry, blood pressure has improved after getting back to room.  He is paced.  Start amiodarone.  Plan admission [SG]  1250 Increased work of breathing, increased cannula 6 L.  Work of breathing improved.  Blood pressure remains soft.  Will continue IV fluids.  Will start patient on BiPAP given increased work of breathing. [SG]  1250 Following amiodarone patient V. tach is resolved, it is now paced effectively, palpitations improved [SG]  1251 CXR reviewed, stable [SG]  1315 Patient started on BiPAP for increased work of breathing, Resp status improving.  Blood pressure improving. [SG]  1322 Will interrogate PM [SG]  1341 SARS Coronavirus 2 by RT PCR(!): POSITIVE [SG]   1341 Respiratory Syncytial Virus by PCR(!): POSITIVE [SG]  1348 Spoke w/ Dr Jenene Slicker, will come see pt [SG]  1348 Pt reports he is feeling better, improved respiratory status, BP improving  [SG]  1351 Troponin I (High Sensitivity)(!!): 7,535 [SG]  1406 Spoke w/ [employer] rep: 103 episodes of NSVT, atrial arrhythmia, device undersensing.  [SG]  1434 Spoke w/ dr Jenene Slicker, favors demand ischemia for trop elev, ?hypoxia from flu/rsv. No need for amio gtt currently, if has recurrent VT will consider [SG]    Clinical Course User Index [SG] Sloan Leiter, DO    Brief summary: 83 year old male with history as above including atrial fibrillation, CAD, ESRD DS, TAVR, CABG with dyspnea, palpitations No syncope, no missed dialysis Patient with v- tachycardia on telemetry on arrival, did convert with amio bolus and improvement to resp status w/ bipap  Blood pressure improving with fluid resuscitation.  Symptoms and hemodynamics have improved. See note from cardiology, plan to admit to [facility] for hypoxia 2/2 viral pna and vt                Additional history obtained: -Additional history obtained from family -External records from outside source obtained and reviewed including: Chart review including previous notes, labs, imaging, consultation notes including  Primary care documentation, home medications, prior cardiology documentation [facility] 01/27/2023 Dr. Herbie [place] >>"Severe native vessel CAD: o 85% proximal to mid LAD after her SP 1 and D1, then 100 and occluded after SP2 o 99% proximal to mid LCx prior to stent without occlusion of the distal LCx after OM1 and 99% OM1 o 100% CTO of RI stent o 100% proxima, l RCA occlusion with extensive small caliber left-to-right collaterals filling the distal branch.  4 of 4 grafts patent: Free LIMA-LAD (from SVG-D2 hood), SVG-D2, SeqSVG-RI-OM 2  Culprit lesions involves 75% distal free LIMA-LAD lesion and 80% - 95%, anastomosis lesion from graft into LAD  followed by diffuse disease in the LAD.  Successful overlapping stents covering both lesions (difficult PCI with stepwise balloon): Onyx Frontier DES 2.0 x 30 mm deployed to 2.3 mm and postdilated to 2.5 mm with, a proximal overlapping Onyx Frontier DES 2.5 mm x 8 mm deployed to 2.75 mm including the overlapping segment postdilation. => TIMI-3 flow maintained, lesions reduced to 0% "  Lab Tests: -I ordered, reviewed, and interpreted labs.   The pertinent results include:   Labs Reviewed  RESP PANEL BY RT-PCR (RSV, FLU A&B, COVID)  RVPGX2 - Abnormal; Notable for the following components:      Result Value   SARS Coronavirus 2 by RT PCR POSITIVE (*)    Resp Syncytial Virus by PCR POSITIVE (*)    All other components within normal limits  LACTIC ACID, PLASMA - Abnormal; Notable for the following components:   Lactic Acid, Venous 4.2 (*)    All other components within normal limits  LACTIC ACID, PLASMA - Abnormal; Notable for the following components:   Lactic Acid, Venous 4.8 (*)    All other components within normal limits  COMPREHENSIVE METABOLIC PANEL - Abnormal; Notable for the following components:   Chloride 93 (*)    Glucose, Bld 154 (*)    BUN 64 (*)    Creatinine, Ser 8.40 (*)    Calcium 8.6 (*)    Albumin 3.2 (*)    AST 578 (*)    ALT 309 (*)    GFR, Estimated 6 (*)    Anion gap 20 (*)    All other components within normal limits  CBC WITH DIFFERENTIAL/PLATELET - Abnormal; Notable for the following components:   RBC 3.01 (*)    Hemoglobin 10.1 (*)    HCT 31.8 (*)    MCV 105.6 (*)    RDW 16.3 (*)    Platelets 78 (*)    Lymphs Abs 0.6 (*)    All other components within normal limits  BLOOD GAS, VENOUS - Abnormal; Notable for the following components:   pCO2, Ven 37 (*)    Bicarbonate 19.5 (*)    Acid-base deficit 5.8 (*)    All other components within normal limits  BRAIN NATRIURETIC PEPTIDE - Abnormal; Notable for the following components:   B Natriuretic Peptide  1,165.0 (*)    All other components within normal limits  CBG MONITORING, ED - Abnormal; Notable for the following components:   Glucose-Capillary 155 (*)  All other components within normal limits  TROPONIN I (HIGH SENSITIVITY) - Abnormal; Notable for the following components:   Troponin I (High Sensitivity) 7,535 (*)    All other components within normal limits  TROPONIN I (HIGH SENSITIVITY) - Abnormal; Notable for the following components:   Troponin I (High Sensitivity) 6,396 (*)    All other components within normal limits  MAGNESIUM  URINALYSIS, W/ REFLEX TO CULTURE (INFECTION SUSPECTED)  HEPARIN LEVEL (UNFRACTIONATED)    Notable for elev trop, elev bnp, elev la  EKG   EKG Interpretation Date/Time:  Wednesday April 07 2023 12:38:05 EST Ventricular Rate:  79 PR Interval:  177 QRS Duration:  161 QT Interval:  460 QTC Calculation: 528 R Axis:   270  Text Interpretation: Atrial-ventricular dual-paced rhythm paced similar to prior Confirmed by Tanda Rockers ([id number]) on 04/07/2023 1:53:38 PM         Imaging Studies ordered: I ordered imaging studies including CXR I independently visualized the following imaging with scope of interpretation limited to determining acute life threatening conditions related to emergency care; findings noted above I independently visualized and interpreted imaging. I agree with the radiologist interpretation   Medicines ordered and prescription drug management: Meds ordered this encounter  Medications   sodium chloride 0.9 % bolus 500 mL   DISCONTD: amiodarone (NEXTERONE) 1.8 mg/mL load via infusion 150 mg   amiodarone (NEXTERONE) 1.5 mg/mL IV bolus only 150 mg   sodium chloride 0.9 % bolus 500 mL   heparin bolus via infusion 4,000 Units   heparin ADULT infusion 100 units/mL (25000 units/270mL)    -I have reviewed the patients home medicines and have made adjustments as needed   Consultations Obtained: I requested consultation with  the Dr Jenene Slicker,  and discussed lab and imaging findings as well as pertinent plan - they recommend: admit   Cardiac Monitoring: The patient was maintained on a cardiac monitor.  I personally viewed and interpreted the cardiac monitored which showed an underlying rhythm of: vtach >> paced Continuous pulse oximetry interpreted by myself, 98% on BIPAP.    Social Determinants of Health:  Diagnosis or treatment significantly limited by social determinants of health: former smoker   Reevaluation: After the interventions noted above, I reevaluated the patient and found that they have improved  Co morbidities that complicate the patient evaluation  Past Medical History:  Diagnosis Date   Anxiety    Atrial fibrillation (HCC)    Bell's palsy    left side of face droops   CAD (coronary artery disease)    s/p stent placement   Colon polyps    ESRD (end stage renal disease) (HCC)    [place]- [facility]- [facility]   Gout    History of blood transfusion    History of kidney stones    Passes   HLD (hyperlipidemia)    HTN (hypertension)    Insomnia    MI (myocardial infarction) (HCC) 1998   Neuropathy    NSTEMI (non-ST elevated myocardial infarction) (HCC)    s/p PCI in early 2000s   OSA on CPAP    wears CPAP   Presence of permanent cardiac pacemaker    S/P TAVR (transcatheter aortic valve replacement) 08/25/2022   s/p TAVR with a 26 mm Edwards S3UR via the TF approach by Dr. Lynnette Caffey & Dr. Laneta Simmers   Severe aortic stenosis    Type II or unspecified type diabetes mellitus without mention of complication, not stated as uncontrolled    Wears glasses  reading    Wears partial dentures       Dispostion: Disposition decision including need for hospitalization was considered, and patient admitted to the hospital.    Final Clinical Impression(s) / ED Diagnoses Final diagnoses:  Ventricular tachycardia (HCC)  ESRD on hemodialysis (HCC)  RSV (acute bronchiolitis due to respiratory  syncytial virus)  Respiratory distress  COVID-19        Sloan Leiter, DO 04/07/23 1618    Sloan Leiter, DO 04/07/23 2021

## 2023-04-07 NOTE — Assessment & Plan Note (Addendum)
Hypotensive down to 76/62, with lactic acidosis of 4.2 > 4.8. Likely secondary to poor oral intake, from COVID and RSV viral infections over the past 2 weeks, with multiple runs of NSVT.  -1 L bolus given -ESRD status, caution with fluids -Hold home Cardizem 60 mg twice daily -Trend lactic acid -Obtain blood cultures -Follow-up UA

## 2023-04-07 NOTE — Assessment & Plan Note (Addendum)
-   HgbA1c -Hold home 70/30 30 units twice daily - SSI- S

## 2023-04-07 NOTE — Assessment & Plan Note (Addendum)
Denies chest pain, but presenting with dyspnea, hypotension, multiple runs of NSVT, in the setting of both COVID and RSV infection.  History of CABG, multiple PCI, most recent placement 01/2023.  Troponin 7535>> 6396.  Last echo 09/2022 EF of 50%, grade 1 DD.regional wall motion abnormalities with inferior hypokinesis and  septal-lateral dyssynchrony consistent with  RV pacing.  -Heparin started in ED, bolus given, drip discontinued due to thrombocytopenia -Cardiology is consulted, evaluated by Dr. Jenene Slicker, recommended admission to [facility], likely demand ischemia. With thrombocytopenia, okay with continuing home dual antiplatelets -N.p.o. midnight

## 2023-04-07 NOTE — ED Notes (Signed)
 [employer] pacemaker interrogated

## 2023-04-07 NOTE — H&P (Addendum)
History and Physical    Pauline Trainer TKZ:[id number] DOB: [birth year] DOA: 04/07/2023  PCP: Alysia Penna, MD   Patient coming from: Home  I have personally briefly reviewed patient's old medical records in [facility]  Chief Complaint: Difficulty breathing  HPI: Bradley Soto is a 83 y.o. male with medical history significant for atrial fibrillation, ESRD, OSA, CABG, severe aortic stenosis status post TAVR, diabetes mellitus, pacemaker status. Patient was brought to the ED reports of difficulty breathing, productive cough, weakness, loss of appetite.  These symptoms have been on going for about 2 to 3 weeks and got worse over the past few days.   At the time of my evaluation, patient is awake, on BiPAP, calm, reports improvement in breathing and feeling better overall.  No chest pain.  No lower extremity swelling.  No abdominal bloating.  Reports poor oral intake, nausea without vomiting.  No diarrhea.  Never smoked cigarettes.  His last dialysis session was yesterday and he completed it.  Reports missing 1 session of dialysis about a week ago.  He still makes urine.  Reports compliance with Plavix and aspirin.  ED Course: Temperature 98.3.  Heart rate 70s to 80s.  Respiratory rate 13-34.  Blood pressure systolic down to 57/32 improved to 134/52 after fluids. Had multiple runs of nonsustained ventricular tachycardia in ED. Troponin 7535 >> 6396 BNP 1165 Lactic acidosis 4.2 >> 4.8. Liver enzymes AST 578, ALT 309.  Multi bili and ALP. Thrombocytopenia of 78. VBG with pH of 7.3, pCO2 of 37. Chest x-ray without acute abnormality. 1 L bolus given in ED.  150 mg amiodarone bolus given.  Patient started on BiPAP with significant improvement in respiratory status.  Also had no further runs of NSVT.   Cardiology consulted, recommended admission to [facility] for cardiology evaluation.  Likely ischemic demand.  Review of Systems: As per HPI all other systems reviewed and negative.  Past  Medical History:  Diagnosis Date   Anxiety    Atrial fibrillation (HCC)    Bell's palsy    left side of face droops   CAD (coronary artery disease)    s/p stent placement   Colon polyps    ESRD (end stage renal disease) (HCC)    [place]- [facility]- [facility]   Gout    History of blood transfusion    History of kidney stones    Passes   HLD (hyperlipidemia)    HTN (hypertension)    Insomnia    MI (myocardial infarction) (HCC) 1998   Neuropathy    NSTEMI (non-ST elevated myocardial infarction) (HCC)    s/p PCI in early 2000s   OSA on CPAP    wears CPAP   Presence of permanent cardiac pacemaker    S/P TAVR (transcatheter aortic valve replacement) 08/25/2022   s/p TAVR with a 26 mm Edwards S3UR via the TF approach by Dr. Lynnette Caffey & Dr. Laneta Simmers   Severe aortic stenosis    Type II or unspecified type diabetes mellitus without mention of complication, not stated as uncontrolled    Wears glasses    reading    Wears partial dentures     Past Surgical History:  Procedure Laterality Date   APPENDECTOMY     AV FISTULA PLACEMENT Right 07/28/2019   Procedure: RIGHT ARM Brachiocephalic ARTERIOVENOUS (AV) FISTULA CREATION;  Surgeon: Nada Libman, MD;  Location: [facility] OR;  Service: Vascular;  Laterality: Right;   CATARACT EXTRACTION     CLIPPING OF ATRIAL APPENDAGE  12/05/2018   Procedure:  Clipping Of Atrial Appendage;  Surgeon: Linden Dolin, MD;  Location: [facility] OR;  Service: Open Heart Surgery;;   CORONARY ANGIOPLASTY WITH STENT PLACEMENT     x4   CORONARY ARTERY BYPASS GRAFT N/A 12/05/2018   Procedure: CORONARY ARTERY BYPASS GRAFTING (CABG) X FOUR USING LEFT INTERNAL MAMMARY ARTERY AND RIGHT SAPHENOUS VEIN GRAFTS;  Surgeon: Linden Dolin, MD;  Location: [facility] OR;  Service: Open Heart Surgery;  Laterality: N/A;   CORONARY STENT INTERVENTION N/A 01/27/2023   Procedure: CORONARY STENT INTERVENTION;  Surgeon: Marykay Lex, MD;  Location[facility]VASIVE CV LAB;  Service: Cardiovascular;   Laterality: N/A;   HERNIA REPAIR     ventral hernia repair with mesh--revision also was required   INSERT / REPLACE / REMOVE PACEMAKER  05/10/2000   pacemaker implanted in [place]]   INTRAOPERATIVE TRANSTHORACIC ECHOCARDIOGRAM N/A 08/25/2022   Procedure: INTRAOPERATIVE TRANSTHORACIC ECHOCARDIOGRAM;  Surgeon: Orbie Pyo, MD;  Location: [facility] INVASIVE CV LAB;  Service: Open Heart Surgery;  Laterality: N/A;   IR THORACENTESIS ASP PLEURAL SPACE W/IMG GUIDE  12/21/2018   IR THORACENTESIS ASP PLEURAL SPACE W/IMG GUIDE  12/30/2018   LEFT HEART CATH AND CORONARY ANGIOGRAPHY N/A 11/30/2018   Procedure: LEFT HEART CATH AND CORONARY ANGIOGRAPHY;  Surgeon: Kathleene Hazel, MD;  Location: [facility] INVASIVE CV LAB;  Service: Cardiovascular;  Laterality: N/A;   LEFT HEART CATH AND CORS/GRAFTS ANGIOGRAPHY N/A 01/27/2023   Procedure: LEFT HEART CATH AND CORS/GRAFTS ANGIOGRAPHY;  Surgeon: Marykay Lex, MD;  Location: [facility] INVASIVE CV LAB;  Service: Cardiovascular;  Laterality: N/A;   MAZE N/A 12/05/2018   Procedure: MAZE;  Surgeon: Linden Dolin, MD;  Location: [facility] OR;  Service: Open Heart Surgery;  Laterality: N/A;   PACEMAKER GENERATOR CHANGE  09/03/09   MDT Adapta generator change in [place]   PARTIAL COLON REMOVAL  2/2   polyp that could not be removed on colonoscopy   PPM GENERATOR CHANGEOUT N/A 06/01/2019   Procedure: PPM GENERATOR CHANGEOUT;  Surgeon: Hillis Range, MD;  Location: [facility] INVASIVE CV LAB;  Service: Cardiovascular;  Laterality: N/A;   REVISON OF ARTERIOVENOUS FISTULA Right 01/24/2020   Procedure: BANDING OF RIGHT ARM ARTERIOVENOUS FISTULA;  Surgeon: Nada Libman, MD;  Location: [facility] OR;  Service: Vascular;  Laterality: Right;   RIGHT/LEFT HEART CATH AND CORONARY/GRAFT ANGIOGRAPHY N/A 07/31/2022   Procedure: RIGHT/LEFT HEART CATH AND CORONARY/GRAFT ANGIOGRAPHY;  Surgeon: Marykay Lex, MD;  Location: [facility] INVASIVE CV LAB;  Service: Cardiovascular;  Laterality: N/A;   TEE WITHOUT  CARDIOVERSION N/A 12/05/2018   Procedure: TRANSESOPHAGEAL ECHOCARDIOGRAM (TEE);  Surgeon: Linden Dolin, MD;  Location: [facility] OR;  Service: Open Heart Surgery;  Laterality: N/A;   TRANSCATHETER AORTIC VALVE REPLACEMENT, TRANSFEMORAL N/A 08/25/2022   Procedure: Transcatheter Aortic Valve Replacement, Transfemoral;  Surgeon: Orbie Pyo, MD;  Location: [facility] INVASIVE CV LAB;  Service: Open Heart Surgery;  Laterality: N/A;     reports that he has quit smoking. His smoking use included cigarettes. He has never used smokeless tobacco. He reports that he does not drink alcohol and does not use drugs.  Allergies  Allergen Reactions   Shellfish Allergy Rash and Other (See Comments)    Family History  Problem Relation Age of Onset   Heart attack Father 72   Heart disease Father    Breast cancer Sister    Pulmonary embolism Sister    Deep vein thrombosis Sister     Prior to Admission medications   Medication Sig Start  Date End Date Taking? Authorizing Provider  amoxicillin (AMOXIL) 500 MG tablet Take 4 tablets (2,000 mg total) by mouth as directed. 1 hour prior to dental work including cleanings 09/02/22   Janetta Hora, PA-C  aspirin EC 81 MG tablet Take 81 mg by mouth in the morning.    [provider]  AURYXIA 1 GM 210 MG(Fe) tablet Ferric Citrate 210 mg 03/25/23 03/24/24  [provider]  B Complex-C-Zn-Folic Acid (DIALYVITE 800 WITH ZINC) 0.8 MG TABS Take 1 tablet by mouth at bedtime. 11/08/19   [provider]  clopidogrel (PLAVIX) 75 MG tablet Take 1 tablet (75 mg total) by mouth daily with breakfast. 01/28/23   Marykay Lex, MD  Cupric Glycinate (COPPER [place]) POWD Take 2 capsules by mouth in the morning.    [provider]  Cyanocobalamin (VITAMIN B-12 PO) Take 1 capsule by mouth in the morning.    [provider]  diclofenac Sodium (VOLTAREN) 1 % GEL Apply 1 Application topically 4 (four) times daily as needed (neck pain).     [provider]  diltiazem (CARDIZEM SR) 60 MG 12 hr capsule Take 1 capsule (60 mg total) by mouth as needed (palpitations). 05/22/22   Sharlene Dory, PA-C  diltiazem (CARDIZEM SR) 60 MG 12 hr capsule 1 capsule Orally Twice a day As needed    [provider]  diphenhydrAMINE HCl, Sleep, (ZZZQUIL PO) Take 1-2 tablets by mouth at bedtime. PureZzzs Sleep Supplement (melatonin/chamomile/lavender)    [provider]  diphenhydramine-acetaminophen (TYLENOL PM) 25-500 MG TABS tablet Take 1 tablet by mouth at bedtime.    [provider]  gabapentin (NEURONTIN) 100 MG capsule Take 3 capsules (300 mg total) by mouth 2 (two) times daily. 01/28/23   Marykay Lex, MD  Methoxy PEG-Epoetin Beta (MIRCERA IJ) Mircera 05/23/22 05/22/23  [provider]  Methoxy PEG-Epoetin Beta (MIRCERA IJ) Mircera 06/09/22 06/08/23  [provider]  nitroGLYCERIN (NITROSTAT) 0.4 MG SL tablet Place 1 tablet (0.4 mg total) under the tongue every 5 (five) minutes as needed for chest pain. 05/22/22   Sharlene Dory, PA-C  NOVOLOG MIX 70/30 FLEXPEN (70-30) 100 UNIT/ML FlexPen Inject 30 Units into the skin in the morning and at bedtime. 02/26/16   [provider]  sevelamer carbonate (RENVELA) 800 MG tablet Take 3 tablets (2,400 mg total) by mouth 3 (three) times daily with meals. 01/28/23   Marykay Lex, MD  triamcinolone cream (KENALOG) 0.5 % 1 application External Twice a day as needed for rash for 30 day(s) 03/12/23   [provider]  VELPHORO 500 MG chewable tablet CRUSH OR CHEW AND SWALLOW 1 TABLET 3 TIMES A DAY WITH MEALS AND 1 TABLET TWICE A DAY WITH SNACKS Oral for 30 Days 02/23/23   [provider]    Physical Exam: Vitals:   04/07/23 1415 04/07/23 1430 04/07/23 1445 04/07/23 1500  BP: (!) 109/51 (!) 106/55 112/68 (!) 134/52  Pulse: 75 75 78 81  Resp: 20 14 (!) 23 20  Temp:      TempSrc:      SpO2: 100% 100% 100% 100%  Weight:      Height:         Constitutional: NAD, calm, comfortable Vitals:   04/07/23 1415 04/07/23 1430 04/07/23 1445 04/07/23 1500  BP: (!) 109/51 (!) 106/55 112/68 (!) 134/52  Pulse: 75 75 78 81  Resp: 20 14 (!) 23 20  Temp:      TempSrc:  SpO2: 100% 100% 100% 100%  Weight:      Height:       Eyes: PERRL, lids and conjunctivae normal ENMT: Mucous membranes are moist.   Neck: normal, supple, no masses, no thyromegaly Respiratory: On BiPAP, clear to auscultation bilaterally, no wheezing, no crackles. Normal respiratory effort. No accessory muscle use.  Cardiovascular: Regular rate and rhythm, no murmurs / rubs / gallops. No extremity edema.  Extremities warm. Abdomen: no tenderness, no masses palpated. No hepatosplenomegaly. Bowel sounds positive.  Musculoskeletal: no clubbing / cyanosis. No joint deformity upper and lower extremities. Skin: no rashes, lesions, ulcers. No induration Neurologic: No facial asymmetry, speech fluent, moving extremities spontaneously Psychiatric: Normal judgment and insight. Alert and oriented x 3. Normal mood.   Labs on Admission: I have personally reviewed following labs and imaging studies  CBC: Recent Labs  Lab 04/07/23 1206  WBC 5.0  NEUTROABS 4.0  HGB 10.1*  HCT 31.8*  MCV 105.6*  PLT 78*   Basic Metabolic Panel: Recent Labs  Lab 04/07/23 1206  NA 135  K 4.5  CL 93*  CO2 22  GLUCOSE 154*  BUN 64*  CREATININE 8.40*  CALCIUM 8.6*  MG 2.3   GFR: Estimated Creatinine Clearance: 8.2 mL/min (A) (by C-G formula based on SCr of 8.4 mg/dL (H)). Liver Function Tests: Recent Labs  Lab 04/07/23 1206  AST 578*  ALT 309*  ALKPHOS 63  BILITOT 0.6  PROT 7.2  ALBUMIN 3.2*   CBG: Recent Labs  Lab 04/07/23 1151  GLUCAP 155*   Urine analysis:    Component Value Date/Time   COLORURINE YELLOW 08/21/2022 1357   APPEARANCEUR CLEAR 08/21/2022 1357   LABSPEC 1.011 08/21/2022 1357   PHURINE 7.0 08/21/2022 1357   GLUCOSEU 150 (A) 08/21/2022 1357    HGBUR NEGATIVE 08/21/2022 1357   BILIRUBINUR NEGATIVE 08/21/2022 1357   KETONESUR NEGATIVE 08/21/2022 1357   PROTEINUR 100 (A) 08/21/2022 1357   NITRITE NEGATIVE 08/21/2022 1357   LEUKOCYTESUR NEGATIVE 08/21/2022 1357    Radiological Exams on Admission: DG Chest Port 1 View Result Date: 04/07/2023 CLINICAL DATA:  Productive cough, weakness and shortness of breath. EXAM: PORTABLE CHEST 1 VIEW COMPARISON:  10/23/2022.  Chest CT dated 09/14/2022. FINDINGS: Stable enlarged cardiac silhouette and calcified thoracic aortic arch. Stable left subclavian pacemaker leads. Stable pleural and parenchymal scarring in the left lower lung zone and small amount of parenchymal scarring at the right lateral lung base. Stable aortic stent valve. Stable post CABG changes and left atrial clip. Previously noted old right scapular fracture. IMPRESSION: 1. No acute abnormality. 2. Stable cardiomegaly. Electronically Signed   By: Beckie Salts M.D.   On: 04/07/2023 12:28    EKG: Independently reviewed.  Initial EKG showing ventricular paced complexes, rate of 86.  Similar subsequent EKGs.  No significant ST-T wave changes from prior.  Assessment/Plan Principal Problem:   Hypotension Active Problems:   Viral infection   NSVT (nonsustained ventricular tachycardia) (HCC)   Elevated troponin   Elevated liver enzymes   Thrombocytopenia (HCC)   OSA on CPAP   HTN (hypertension)   ESRD (end stage renal disease) (HCC)   Type II diabetes mellitus with renal manifestations (HCC)   S/P CABG x 4   Presence of permanent cardiac pacemaker   S/P TAVR (transcatheter aortic valve replacement)  Assessment and Plan: * Hypotension Hypotensive down to 76/62, with lactic acidosis of 4.2 > 4.8. Likely secondary to poor oral intake, from COVID and RSV viral infections over the past 2  weeks, with multiple runs of NSVT.  -1 L bolus given -ESRD status, caution with fluids -Hold home Cardizem 60 mg twice daily -Trend lactic  acid -Obtain blood cultures -Follow-up UA  Elevated troponin Denies chest pain, but presenting with dyspnea, hypotension, multiple runs of NSVT, in the setting of both COVID and RSV infection.  History of CABG, multiple PCI, most recent placement 01/2023.  Troponin 7535>> 6396.  Last echo 09/2022 EF of 50%, grade 1 DD.regional wall motion abnormalities with inferior hypokinesis and  septal-lateral dyssynchrony consistent with  RV pacing.  -Heparin started in ED, bolus given, drip discontinued due to thrombocytopenia -Cardiology is consulted, evaluated by Dr. Jenene Slicker, recommended admission to [facility], likely demand ischemia. With thrombocytopenia, okay with continuing home dual antiplatelets -N.p.o. midnight    NSVT (nonsustained ventricular tachycardia) (HCC) Multiple runs of nonsustained V. tach in ED.  Received 150 mg amiodarone and was placed on BiPAP with improvement in respiratory status, with subsequent termination of NSVT.  Pacemaker interrogated-confirmed > 100 runs of NSVT. -Evaluated by cardiology, start beta-blocker if BP stable, hold off on amiodarone drip.  Viral infection COVID and RSV infection.  Presenting with severe dyspnea, cough.  Hypoxia not mentioned or documented, initial sats 92% on room air.  Chest x-ray negative for acute abnormality.  VBG WNL- with pH of 7.3, pCO2 of 37.  Was placed on BiPAP due to increased work of breathing- with significant improvement in respiratory status.  Likely viral bronchitis.  Not tachycardic, no lower extremity swelling to suggest PE. Never smoked cigarettes. -DuoNebs as needed and scheduled -Symptoms of > 2 weeks, outside window for benefit from Paxlovid -D-dimer, ferritin, LDH, CRP -Initial hypotension and dyspnea, with elevated liver enzymes, obtain CTA chest and abdomen.  Thrombocytopenia (HCC) Platelets of 78, baseline over the past year - 78-158.  Likely due to viral infections COVID and RSV. -Trend for now -Resuming DAPT ,  but avoiding anticoagulants   Elevated liver enzymes Transaminitis, with AST of 578, ALT of 309.  Normal T. bili 0.6 and ALP 63.  Abdomen is benign.  Likely secondary to poor perfusion from hypotension, acute viral infection COVID and RSV.  Last abdominal imaging 07/2022 showed suspicious liver lesion. -Trend liver enzymes -Hepatitis panel - RUQ [place]  ESRD (end stage renal disease) [facility]) Schedule Tuesday Thursday Saturday.  Last HD was yesterday. -Consult nephrology for HD  HTN (hypertension) Presenting with hypotension. -Hold Cardizem for now  OSA on CPAP CPAP qhs  Type II diabetes mellitus with renal manifestations (HCC) - HgbA1c -Hold home 70/30 30 units twice daily - SSI- S    DVT prophylaxis: SCDS Code Status: FULL code-confirmed with patient at bedside Family Communication: None at bedside Disposition Plan: > 2 days Consults called: Cardiology Admission status: Inpt Stepdown I certify that at the point of admission it is my clinical judgment that the patient will require inpatient hospital care spanning beyond 2 midnights from the point of admission due to high intensity of service, high risk for further deterioration and high frequency of surveillance required.    CRITICAL CARE Performed by: Onnie Boer   Total critical care time: 70 minutes  Critical care time was exclusive of separately billable procedures and treating other patients.  Critical care was necessary to treat or prevent imminent or life-threatening deterioration.  Critical care was time spent personally by me on the following activities: development of treatment plan with patient and/or surrogate as well as nursing, discussions with consultants, evaluation of patient's response to treatment,  examination of patient, obtaining history from patient or surrogate, ordering and performing treatments and interventions, ordering and review of laboratory studies, ordering and review of radiographic  studies, pulse oximetry and re-evaluation of patient's condition.   Author: Onnie Boer, MD 04/07/2023 10:15 PM  For on call review [email or web address].

## 2023-04-07 NOTE — Assessment & Plan Note (Addendum)
Multiple runs of nonsustained V. tach in ED.  Received 150 mg amiodarone and was placed on BiPAP with improvement in respiratory status, with subsequent termination of NSVT.  Pacemaker interrogated-confirmed > 100 runs of NSVT. -Evaluated by cardiology, start beta-blocker if BP stable, hold off on amiodarone drip.

## 2023-04-07 NOTE — Assessment & Plan Note (Addendum)
 CPAP qhs

## 2023-04-07 NOTE — Progress Notes (Signed)
PHARMACY - ANTICOAGULATION CONSULT NOTE  Pharmacy Consult for heparin Indication: chest pain/ACS  Allergies  Allergen Reactions   Shellfish Allergy Rash    Patient Measurements: Height: 5\' 11"  (180.3 cm) Weight: 103.4 kg (228 lb) IBW/kg (Calculated) : 75.3 Heparin Dosing Weight: 96 kg  Vital Signs: Temp: 98.3 F (36.8 C) (02/19 1141) Temp Source: Oral (02/19 1141) BP: 93/47 (02/19 1330) Pulse Rate: 76 (02/19 1330)  Labs: Recent Labs    04/07/23 1206  HGB 10.1*  HCT 31.8*  PLT 78*  CREATININE 8.40*  TROPONINIHS 7,535*    Estimated Creatinine Clearance: 8.2 mL/min (A) (by C-G formula based on SCr of 8.4 mg/dL (H)).   Medical History: Past Medical History:  Diagnosis Date   Anxiety    Atrial fibrillation (HCC)    Bell's palsy    left side of face droops   CAD (coronary artery disease)    s/p stent placement   Colon polyps    ESRD (end stage renal disease) (HCC)    [place]- [facility]- [facility]   Gout    History of blood transfusion    History of kidney stones    Passes   HLD (hyperlipidemia)    HTN (hypertension)    Insomnia    MI (myocardial infarction) (HCC) 1998   Neuropathy    NSTEMI (non-ST elevated myocardial infarction) (HCC)    s/p PCI in early 2000s   OSA on CPAP    wears CPAP   Presence of permanent cardiac pacemaker    S/P TAVR (transcatheter aortic valve replacement) 08/25/2022   s/p TAVR with a 26 mm Edwards S3UR via the TF approach by Dr. Lynnette Caffey & Dr. Laneta Simmers   Severe aortic stenosis    Type II or unspecified type diabetes mellitus without mention of complication, not stated as uncontrolled    Wears glasses    reading    Wears partial dentures     Medications:  (Not in a hospital admission)   Assessment: Pharmacy consulted to dose heparin in patient with chest pain/ACS. Patient is not on anticoagulation.  Trop 7,535 Hgb 10.1 Platelets 78  Goal of Therapy:  Heparin level 0.3-0.7 units/ml Monitor platelets by anticoagulation  protocol: Yes   Plan:  Give 4000 units bolus x 1 Start heparin infusion at 1200 units/hr Check anti-Xa level in 8 hours and daily while on heparin Continue to monitor H&H and platelets  Judeth Cornfield, PharmD Clinical Pharmacist 04/07/2023 1:58 PM

## 2023-04-08 ENCOUNTER — Inpatient Hospital Stay (HOSPITAL_COMMUNITY): Payer: Medicare Other

## 2023-04-08 ENCOUNTER — Encounter (HOSPITAL_COMMUNITY): Payer: Self-pay | Admitting: Internal Medicine

## 2023-04-08 DIAGNOSIS — J210 Acute bronchiolitis due to respiratory syncytial virus: Secondary | ICD-10-CM | POA: Diagnosis not present

## 2023-04-08 DIAGNOSIS — R7989 Other specified abnormal findings of blood chemistry: Secondary | ICD-10-CM | POA: Diagnosis not present

## 2023-04-08 DIAGNOSIS — U071 COVID-19: Secondary | ICD-10-CM | POA: Diagnosis not present

## 2023-04-08 DIAGNOSIS — N186 End stage renal disease: Secondary | ICD-10-CM | POA: Diagnosis not present

## 2023-04-08 DIAGNOSIS — Z992 Dependence on renal dialysis: Secondary | ICD-10-CM | POA: Diagnosis not present

## 2023-04-08 DIAGNOSIS — I2510 Atherosclerotic heart disease of native coronary artery without angina pectoris: Secondary | ICD-10-CM | POA: Diagnosis not present

## 2023-04-08 LAB — COMPREHENSIVE METABOLIC PANEL
ALT: 614 U/L — ABNORMAL HIGH (ref 0–44)
AST: 1026 U/L — ABNORMAL HIGH (ref 15–41)
Albumin: 2.7 g/dL — ABNORMAL LOW (ref 3.5–5.0)
Alkaline Phosphatase: 85 U/L (ref 38–126)
Anion gap: 22 — ABNORMAL HIGH (ref 5–15)
BUN: 71 mg/dL — ABNORMAL HIGH (ref 8–23)
CO2: 20 mmol/L — ABNORMAL LOW (ref 22–32)
Calcium: 8.4 mg/dL — ABNORMAL LOW (ref 8.9–10.3)
Chloride: 95 mmol/L — ABNORMAL LOW (ref 98–111)
Creatinine, Ser: 9.56 mg/dL — ABNORMAL HIGH (ref 0.61–1.24)
GFR, Estimated: 5 mL/min — ABNORMAL LOW (ref >60)
Glucose, Bld: 187 mg/dL — ABNORMAL HIGH (ref 70–99)
Potassium: 5.5 mmol/L — ABNORMAL HIGH (ref 3.5–5.1)
Sodium: 137 mmol/L (ref 135–145)
Total Bilirubin: 0.8 mg/dL (ref 0.0–1.2)
Total Protein: 6.1 g/dL — ABNORMAL LOW (ref 6.5–8.1)

## 2023-04-08 LAB — CBC
HCT: 29.7 % — ABNORMAL LOW (ref 39.0–52.0)
Hemoglobin: 9.3 g/dL — ABNORMAL LOW (ref 13.0–17.0)
MCH: 32.3 pg (ref 26.0–34.0)
MCHC: 31.3 g/dL (ref 30.0–36.0)
MCV: 103.1 fL — ABNORMAL HIGH (ref 80.0–100.0)
Platelets: 69 10*3/uL — ABNORMAL LOW (ref 150–400)
RBC: 2.88 MIL/uL — ABNORMAL LOW (ref 4.22–5.81)
RDW: 16.2 % — ABNORMAL HIGH (ref 11.5–15.5)
WBC: 6.1 10*3/uL (ref 4.0–10.5)
nRBC: 0.5 % — ABNORMAL HIGH (ref 0.0–0.2)

## 2023-04-08 LAB — ECHOCARDIOGRAM LIMITED
AR max vel: 2.19 cm2
AV Area VTI: 2.32 cm2
AV Area mean vel: 2.16 cm2
AV Mean grad: 7.5 mm[Hg]
AV Peak grad: 15.4 mm[Hg]
Ao pk vel: 1.96 m/s
Area-P 1/2: 3.93 cm2
Calc EF: 22 %
Est EF: 25
Height: 71 in
S' Lateral: 5.5 cm
Single Plane A2C EF: 23 %
Single Plane A4C EF: 19.1 %
Weight: 3456.81 [oz_av]

## 2023-04-08 LAB — URINALYSIS, W/ REFLEX TO CULTURE (INFECTION SUSPECTED)
Bilirubin Urine: NEGATIVE
Glucose, UA: 50 mg/dL — AB
Ketones, ur: NEGATIVE mg/dL
Leukocytes,Ua: NEGATIVE
Nitrite: NEGATIVE
Protein, ur: 100 mg/dL — AB
Specific Gravity, Urine: 1.015 (ref 1.005–1.030)
pH: 5 (ref 5.0–8.0)

## 2023-04-08 LAB — TROPONIN I (HIGH SENSITIVITY): Troponin I (High Sensitivity): 6165 ng/L (ref <18)

## 2023-04-08 LAB — GLUCOSE, CAPILLARY
Glucose-Capillary: 133 mg/dL — ABNORMAL HIGH (ref 70–99)
Glucose-Capillary: 120 mg/dL — ABNORMAL HIGH (ref 70–99)
Glucose-Capillary: 155 mg/dL — ABNORMAL HIGH (ref 70–99)
Glucose-Capillary: 231 mg/dL — ABNORMAL HIGH (ref 70–99)

## 2023-04-08 LAB — D-DIMER, QUANTITATIVE: D-Dimer, Quant: 3.9 ug{FEU}/mL — ABNORMAL HIGH (ref 0.00–0.50)

## 2023-04-08 LAB — PHOSPHORUS: Phosphorus: 9.7 mg/dL — ABNORMAL HIGH (ref 2.5–4.6)

## 2023-04-08 LAB — HEMOGLOBIN A1C
Hgb A1c MFr Bld: 6.8 % — ABNORMAL HIGH (ref 4.8–5.6)
Mean Plasma Glucose: 148.46 mg/dL

## 2023-04-08 LAB — LACTATE DEHYDROGENASE: LDH: 764 U/L — ABNORMAL HIGH (ref 98–192)

## 2023-04-08 LAB — HEPATITIS B SURFACE ANTIGEN: Hepatitis B Surface Ag: NONREACTIVE

## 2023-04-08 LAB — FERRITIN: Ferritin: >7500 ng/mL — ABNORMAL HIGH (ref 24–336)

## 2023-04-08 LAB — C-REACTIVE PROTEIN: CRP: 8.7 mg/dL — ABNORMAL HIGH (ref <1.0)

## 2023-04-08 MED ORDER — LIDOCAINE-PRILOCAINE 2.5-2.5 % EX CREA: 1.0000 | TOPICAL_CREAM | CUTANEOUS | Status: DC | PRN

## 2023-04-08 MED ORDER — LIDOCAINE HCL (PF) 1 % IJ SOLN: 5.0000 mL | INTRAMUSCULAR | Status: DC | PRN

## 2023-04-08 MED ORDER — NEPRO/CARBSTEADY PO LIQD: 237.0000 mL | ORAL | Status: DC | PRN

## 2023-04-08 MED ORDER — ANTICOAGULANT SODIUM CITRATE 4% (200MG/5ML) IV SOLN: 5.0000 mL | Status: DC | PRN

## 2023-04-08 MED ORDER — OXYCODONE HCL 5 MG PO TABS
5.0000 mg | ORAL_TABLET | Freq: Once | ORAL | Status: AC
  Administered 2023-04-08: 5 mg via ORAL
  Filled 2023-04-08: qty 1

## 2023-04-08 MED ORDER — IOHEXOL 350 MG/ML SOLN
75.0000 mL | Freq: Once | INTRAVENOUS | Status: AC | PRN
  Administered 2023-04-08: 75 mL via INTRAVENOUS

## 2023-04-08 MED ORDER — MELATONIN 5 MG PO TABS
5.0000 mg | ORAL_TABLET | Freq: Every evening | ORAL | Status: DC | PRN
  Administered 2023-04-08: 5 mg via ORAL
  Filled 2023-04-08: qty 1

## 2023-04-08 MED ORDER — CHLORHEXIDINE GLUCONATE CLOTH 2 % EX PADS: 6.0000 | MEDICATED_PAD | Freq: Every day | CUTANEOUS | Status: DC

## 2023-04-08 MED ORDER — ALTEPLASE 2 MG IJ SOLR: 2.0000 mg | Freq: Once | INTRAMUSCULAR | Status: DC | PRN

## 2023-04-08 MED ORDER — HEPARIN SODIUM (PORCINE) 1000 UNIT/ML DIALYSIS: 1000.0000 [IU] | INTRAMUSCULAR | Status: DC | PRN

## 2023-04-08 MED ORDER — PREDNISONE 10 MG PO TABS
50.0000 mg | ORAL_TABLET | Freq: Every day | ORAL | Status: DC
  Administered 2023-04-08: 50 mg via ORAL
  Filled 2023-04-08: qty 1

## 2023-04-08 MED ORDER — HEPARIN SODIUM (PORCINE) 1000 UNIT/ML DIALYSIS: 1500.0000 [IU] | INTRAMUSCULAR | Status: DC | PRN

## 2023-04-08 MED ORDER — HEPARIN SODIUM (PORCINE) 1000 UNIT/ML DIALYSIS
2500.0000 [IU] | Freq: Once | INTRAMUSCULAR | Status: DC
  Filled 2023-04-08: qty 3

## 2023-04-08 MED ORDER — FERRIC CITRATE 1 GM 210 MG(FE) PO TABS
420.0000 mg | ORAL_TABLET | Freq: Three times a day (TID) | ORAL | Status: DC
  Filled 2023-04-08 (×3): qty 2

## 2023-04-08 MED ORDER — PERFLUTREN LIPID MICROSPHERE
1.0000 mL | INTRAVENOUS | Status: AC | PRN
  Administered 2023-04-08: 3 mL via INTRAVENOUS

## 2023-04-08 MED ORDER — HYDROMORPHONE HCL 1 MG/ML IJ SOLN
0.5000 mg | Freq: Once | INTRAMUSCULAR | Status: AC
  Administered 2023-04-08: 0.5 mg via INTRAVENOUS
  Filled 2023-04-08: qty 0.5

## 2023-04-08 MED ORDER — METOPROLOL TARTRATE 25 MG PO TABS
25.0000 mg | ORAL_TABLET | Freq: Two times a day (BID) | ORAL | Status: DC
  Filled 2023-04-08: qty 1

## 2023-04-08 MED ORDER — PENTAFLUOROPROP-TETRAFLUOROETH EX AERO: 1.0000 | INHALATION_SPRAY | CUTANEOUS | Status: DC | PRN

## 2023-04-08 NOTE — Progress Notes (Signed)
 Heart Failure Navigator Progress Note  Assessed for Heart & Vascular TOC clinic readiness.  Patient does not meet criteria due to ESRD on hemodialysis.   Navigator will sign off at this time.   Rhae Hammock, BSN, Scientist, clinical (histocompatibility and immunogenetics) Only

## 2023-04-08 NOTE — Progress Notes (Signed)
PROGRESS NOTE    Bradley Soto  MVH:[id number] DOB: [birth year] DOA: 04/07/2023 PCP: Alysia Penna, MD    Brief Narrative:  Bradley Soto is a 83 y.o. male with medical history significant for atrial fibrillation, ESRD, OSA, CABG, severe aortic stenosis status post TAVR, diabetes mellitus, pacemaker status. Patient was brought to the ED reports of difficulty breathing, productive cough, weakness, loss of appetite.  These symptoms have been on going for about 2 to 3 weeks and got worse over the past few days.   Found to have COVID and RSV.   Assessment and Plan: Hypotension -resolved  Elevated troponin -per cards:  do not think that we necessarily are looking at an acute coronary syndrome.  I think that that his troponin levels are due to severe strain in the setting of underlying severe native coronary artery disease. Will look at his echocardiogram.  As long as his LV function is basically unchanged from his previous echo then I think we will continue with conservative medical therapy.  NSVT (nonsustained ventricular tachycardia) (HCC) Multiple runs of nonsustained V. tach in ED.  Received 150 mg amiodarone and was placed on BiPAP with improvement in respiratory status, with subsequent termination of NSVT.  Pacemaker interrogated-confirmed > 100 runs of NSVT. -on BB  Viral infection:  COVID and RSV infection.   -Presenting with severe dyspnea, cough.  Hypoxia not mentioned or documented, initial sats 92% on room air.  Chest x-ray negative for acute abnormality.  VBG WNL- with pH of 7.3, pCO2 of 37.  Was placed on BiPAP due to increased work of breathing- with significant improvement in respiratory status.  Likely viral bronchitis.  Not tachycardic, no lower extremity swelling to suggest PE. Never smoked cigarettes. -DuoNebs as needed and scheduled -Symptoms of > 2 weeks, outside window for antivirals -D-dimer, ferritin, LDH, CRP -CT scans ordered by admitting MD pending  Shock  liver -trend  Thrombocytopenia (HCC) Platelets of 78, baseline over the past year - 78-158.  Likely due to viral infections COVID and RSV. -Trend  ESRD (end stage renal disease) [facility]) Schedule Tuesday Thursday Saturday.  Last HD was yesterday. -nephrology for HD  HTN (hypertension) -added BB  OSA on CPAP CPAP qhs  Type II diabetes mellitus with renal manifestations (HCC) -Hold home 70/30 30 units twice daily - SSI- S    DVT prophylaxis: SCDs Start: 04/07/23 2224    Code Status: Full Code   Disposition Plan:  Level of care: Progressive Status is: Inpatient Remains inpatient appropriate     Consultants:  Renal cards   Subjective: Wants to take CPAP off  Objective: Vitals:   04/08/23 0000 04/08/23 0414 04/08/23 0729 04/08/23 1137  BP: (!) 141/119 (!) 95/56 (!) 117/58 127/61  Pulse: 90 78 66 (!) 59  Resp: (!) 24 17 20 20   Temp: 98.6 F (37 C) 98.6 F (37 C) 97.7 F (36.5 C) 97.8 F (36.6 C)  TempSrc: Oral Oral Oral Oral  SpO2: 98% 94% 98% 96%  Weight:  98 kg    Height:        Intake/Output Summary (Last 24 hours) at 04/08/2023 1151 Last data filed at 04/07/2023 1634 Gross per 24 hour  Intake 1164.22 ml  Output --  Net 1164.22 ml   Filed Weights   04/07/23 1138 04/07/23 2120 04/08/23 0414  Weight: 103.4 kg 97.5 kg 98 kg    Examination:   General: Appearance:    Obese male in no acute distress     Lungs:  Coarse breath sounds, respirations unlabored  Heart:    Bradycardic.    MS:   All extremities are intact.    Neurologic:   Awake, alert       Data Reviewed: I have personally reviewed following labs and imaging studies  CBC: Recent Labs  Lab 04/07/23 1206 04/08/23 0228  WBC 5.0 6.1  NEUTROABS 4.0  --   HGB 10.1* 9.3*  HCT 31.8* 29.7*  MCV 105.6* 103.1*  PLT 78* 69*   Basic Metabolic Panel: Recent Labs  Lab 04/07/23 1206 04/08/23 0228  NA 135 137  K 4.5 5.5*  CL 93* 95*  CO2 22 20*  GLUCOSE 154* 187*  BUN 64* 71*   CREATININE 8.40* 9.56*  CALCIUM 8.6* 8.4*  MG 2.3  --   PHOS  --  9.7*   GFR: Estimated Creatinine Clearance: 7 mL/min (A) (by C-G formula based on SCr of 9.56 mg/dL (H)). Liver Function Tests: Recent Labs  Lab 04/07/23 1206 04/08/23 0228  AST 578* 1,026*  ALT 309* 614*  ALKPHOS 63 85  BILITOT 0.6 0.8  PROT 7.2 6.1*  ALBUMIN 3.2* 2.7*   No results for input(s): "LIPASE", "AMYLASE" in the last 168 hours. No results for input(s): "AMMONIA" in the last 168 hours. Coagulation Profile: No results for input(s): "INR", "PROTIME" in the last 168 hours. Cardiac Enzymes: No results for input(s): "CKTOTAL", "CKMB", "CKMBINDEX", "TROPONINI" in the last 168 hours. BNP (last 3 results) No results for input(s): "PROBNP" in the last 8760 hours. HbA1C: Recent Labs    04/08/23 0228  HGBA1C 6.8*   CBG: Recent Labs  Lab 04/07/23 1151 04/07/23 2258 04/08/23 0440 04/08/23 1133  GLUCAP 155* 193* 133* 120*   Lipid Profile: No results for input(s): "CHOL", "HDL", "LDLCALC", "TRIG", "CHOLHDL", "LDLDIRECT" in the last 72 hours. Thyroid Function Tests: Recent Labs    04/07/23 1422  TSH 2.194   Anemia Panel: Recent Labs    04/07/23 1914 04/08/23 0228  FERRITIN >7,500* >7,500*   Sepsis Labs: Recent Labs  Lab 04/07/23 1206 04/07/23 1422 04/07/23 1753  LATICACIDVEN 4.2* 4.8* 1.9    Recent Results (from the past 240 hours)  Resp panel by RT-PCR (RSV, Flu A&B, Covid) Anterior Nasal Swab     Status: Abnormal   Collection Time: 04/07/23 12:46 PM   Specimen: Anterior Nasal Swab  Result Value Ref Range Status   SARS Coronavirus 2 by RT PCR POSITIVE (A) NEGATIVE Final    Comment: (NOTE) SARS-CoV-2 target nucleic acids are DETECTED.  The SARS-CoV-2 RNA is generally detectable in upper respiratory specimens during the acute phase of infection. Positive results are indicative of the presence of the identified virus, but do not rule out bacterial infection or co-infection with  other pathogens not detected by the test. Clinical correlation with patient history and other diagnostic information is necessary to determine patient infection status. The expected result is Negative.  Fact Sheet for Patients: BloggerCourse.com  Fact Sheet for Healthcare Providers: SeriousBroker.it  This test is not yet approved or cleared by the [place] FDA and  has been authorized for detection and/or diagnosis of SARS-CoV-2 by FDA under an Emergency Use Authorization (EUA).  This EUA will remain in effect (meaning this test can be used) for the duration of  the COVID-19 declaration under Section 564(b)(1) of the A ct, 21 U.S.C. section 360bbb-3(b)(1), unless the authorization is terminated or revoked sooner.     Influenza A by PCR NEGATIVE NEGATIVE Final   Influenza B by PCR NEGATIVE  NEGATIVE Final    Comment: (NOTE) The Xpert Xpress SARS-CoV-2/FLU/RSV plus assay is intended as an aid in the diagnosis of influenza from Nasopharyngeal swab specimens and should not be used as a sole basis for treatment. Nasal washings and aspirates are unacceptable for Xpert Xpress SARS-CoV-2/FLU/RSV testing.  Fact Sheet for Patients: BloggerCourse.com  Fact Sheet for Healthcare Providers: SeriousBroker.it  This test is not yet approved or cleared by the [place] FDA and has been authorized for detection and/or diagnosis of SARS-CoV-2 by FDA under an Emergency Use Authorization (EUA). This EUA will remain in effect (meaning this test can be used) for the duration of the COVID-19 declaration under Section 564(b)(1) of the Act, 21 U.S.C. section 360bbb-3(b)(1), unless the authorization is terminated or revoked.     Resp Syncytial Virus by PCR POSITIVE (A) NEGATIVE Final    Comment: (NOTE) Fact Sheet for Patients: BloggerCourse.com  Fact Sheet for  Healthcare Providers: SeriousBroker.it  This test is not yet approved or cleared by the [place] FDA and has been authorized for detection and/or diagnosis of SARS-CoV-2 by FDA under an Emergency Use Authorization (EUA). This EUA will remain in effect (meaning this test can be used) for the duration of the COVID-19 declaration under Section 564(b)(1) of the Act, 21 U.S.C. section 360bbb-3(b)(1), unless the authorization is terminated or revoked.  Performed at [facility], [address], [place], [place] [address]          Radiology Studies: CT Angio Chest Pulmonary Embolism (PE) W or WO Contrast Result Date: 04/08/2023 CLINICAL DATA:  Elevated liver enzymes, hypotension, suspicious liver lesion identified by prior CT, dyspnea, hypotension, COVID and RSV, shortness of breath * Tracking Code: BO * EXAM: CT ANGIOGRAPHY CHEST CT ABDOMEN AND PELVIS WITH CONTRAST TECHNIQUE: Multidetector CT imaging of the chest was performed using the standard protocol during bolus administration of intravenous contrast. Multiplanar CT image reconstructions and MIPs were obtained to evaluate the vascular anatomy. Multidetector CT imaging of the abdomen and pelvis was performed using the standard protocol during bolus administration of intravenous contrast. RADIATION DOSE REDUCTION: This exam was performed according to the departmental dose-optimization program which includes automated exposure control, adjustment of the mA and/or kV according to patient size and/or use of iterative reconstruction technique. CONTRAST:  75mL OMNIPAQUE IOHEXOL 350 MG/ML SOLN COMPARISON:  CT chest, 12/16/2022 FINDINGS: CT CHEST ANGIOGRAM FINDINGS Cardiovascular: Satisfactory opacification of the pulmonary arteries to the segmental level. No evidence of pulmonary embolism. Cardiomegaly status post median sternotomy and CABG. Three-vessel coronary artery calcifications and or stents. Left atrial appendage  clip. No pericardial effusion. Left chest multi lead pacer. Aortic atherosclerosis. Aortic valve stent endograft. Mediastinum/Nodes: No enlarged mediastinal, hilar, or axillary lymph nodes. Small hiatal hernia. Thyroid gland, trachea, and esophagus demonstrate no significant findings. Lungs/Pleura: Scattered areas of irregular ground-glass airspace opacity throughout the lungs. Diffuse bilateral bronchial wall thickening. No pleural effusion or pneumothorax. Musculoskeletal: Bilateral gynecomastia.  No acute osseous findings. Review of the MIP images confirms the above findings. CT ABDOMEN PELVIS FINDINGS Hepatobiliary: No solid liver abnormality is seen. Hepatomegaly, maximum coronal span 24.6 cm. Small gallstones. Gallbladder wall thickening and trace pericholecystic fluid (series 3, image 36). No biliary ductal dilatation. Pancreas: Scattered calcifications throughout the atrophic pancreas, consistent with chronic stigmata of pancreatitis. No pancreatic ductal dilatation. Spleen: Mild splenomegaly, maximum coronal span 13.9 cm. Adrenals/Urinary Tract: Adrenal glands are unremarkable. Atrophic kidneys. Small nonobstructive bilateral renal calculi and renal vascular calcifications. No hydronephrosis. Bladder is unremarkable. Stomach/Bowel: Stomach is within normal  limits. Mild wall thickening and inflammatory fluid about the descending duodenal (series 3, image 39). Status post right hemicolectomy and reanastomosis. No evidence of bowel wall thickening, distention, or other inflammatory changes. Sigmoid diverticulosis. Vascular/Lymphatic: Severe, pipe like aortic atherosclerosis. No enlarged abdominal or pelvic lymph nodes. Reproductive: Prostatomegaly. Other: Broad-based midline ventral hernia with mesh repair. No ascites. Musculoskeletal: No acute or significant osseous findings. IMPRESSION: 1. Negative examination for pulmonary embolism. 2. Diffuse bilateral bronchial wall thickening and scattered areas of  irregular ground-glass airspace opacity throughout the lungs, consistent with atypical or viral infection and in keeping with reported COVID and RSV. 3. Cholelithiasis. Gallbladder wall thickening and trace pericholecystic fluid, concerning for acute cholecystitis. 4. Mild wall thickening and inflammatory fluid about the descending duodenum. This may be reactive to adjacent gallbladder inflammation or reflect groove pancreatitis. 5. Hepatomegaly and splenomegaly. 6. Atrophic kidneys. Small nonobstructive bilateral renal calculi. No hydronephrosis. 7. Status post right hemicolectomy and reanastomosis. Aortic Atherosclerosis (ICD10-I70.0). Electronically Signed   By: Jearld Lesch M.D.   On: 04/08/2023 11:30   CT ABDOMEN PELVIS W CONTRAST Result Date: 04/08/2023 CLINICAL DATA:  Elevated liver enzymes, hypotension, suspicious liver lesion identified by prior CT, dyspnea, hypotension, COVID and RSV, shortness of breath * Tracking Code: BO * EXAM: CT ANGIOGRAPHY CHEST CT ABDOMEN AND PELVIS WITH CONTRAST TECHNIQUE: Multidetector CT imaging of the chest was performed using the standard protocol during bolus administration of intravenous contrast. Multiplanar CT image reconstructions and MIPs were obtained to evaluate the vascular anatomy. Multidetector CT imaging of the abdomen and pelvis was performed using the standard protocol during bolus administration of intravenous contrast. RADIATION DOSE REDUCTION: This exam was performed according to the departmental dose-optimization program which includes automated exposure control, adjustment of the mA and/or kV according to patient size and/or use of iterative reconstruction technique. CONTRAST:  75mL OMNIPAQUE IOHEXOL 350 MG/ML SOLN COMPARISON:  CT chest, 12/16/2022 FINDINGS: CT CHEST ANGIOGRAM FINDINGS Cardiovascular: Satisfactory opacification of the pulmonary arteries to the segmental level. No evidence of pulmonary embolism. Cardiomegaly status post median sternotomy  and CABG. Three-vessel coronary artery calcifications and or stents. Left atrial appendage clip. No pericardial effusion. Left chest multi lead pacer. Aortic atherosclerosis. Aortic valve stent endograft. Mediastinum/Nodes: No enlarged mediastinal, hilar, or axillary lymph nodes. Small hiatal hernia. Thyroid gland, trachea, and esophagus demonstrate no significant findings. Lungs/Pleura: Scattered areas of irregular ground-glass airspace opacity throughout the lungs. Diffuse bilateral bronchial wall thickening. No pleural effusion or pneumothorax. Musculoskeletal: Bilateral gynecomastia.  No acute osseous findings. Review of the MIP images confirms the above findings. CT ABDOMEN PELVIS FINDINGS Hepatobiliary: No solid liver abnormality is seen. Hepatomegaly, maximum coronal span 24.6 cm. Small gallstones. Gallbladder wall thickening and trace pericholecystic fluid (series 3, image 36). No biliary ductal dilatation. Pancreas: Scattered calcifications throughout the atrophic pancreas, consistent with chronic stigmata of pancreatitis. No pancreatic ductal dilatation. Spleen: Mild splenomegaly, maximum coronal span 13.9 cm. Adrenals/Urinary Tract: Adrenal glands are unremarkable. Atrophic kidneys. Small nonobstructive bilateral renal calculi and renal vascular calcifications. No hydronephrosis. Bladder is unremarkable. Stomach/Bowel: Stomach is within normal limits. Mild wall thickening and inflammatory fluid about the descending duodenal (series 3, image 39). Status post right hemicolectomy and reanastomosis. No evidence of bowel wall thickening, distention, or other inflammatory changes. Sigmoid diverticulosis. Vascular/Lymphatic: Severe, pipe like aortic atherosclerosis. No enlarged abdominal or pelvic lymph nodes. Reproductive: Prostatomegaly. Other: Broad-based midline ventral hernia with mesh repair. No ascites. Musculoskeletal: No acute or significant osseous findings. IMPRESSION: 1. Negative examination for  pulmonary embolism. 2. Diffuse  bilateral bronchial wall thickening and scattered areas of irregular ground-glass airspace opacity throughout the lungs, consistent with atypical or viral infection and in keeping with reported COVID and RSV. 3. Cholelithiasis. Gallbladder wall thickening and trace pericholecystic fluid, concerning for acute cholecystitis. 4. Mild wall thickening and inflammatory fluid about the descending duodenum. This may be reactive to adjacent gallbladder inflammation or reflect groove pancreatitis. 5. Hepatomegaly and splenomegaly. 6. Atrophic kidneys. Small nonobstructive bilateral renal calculi. No hydronephrosis. 7. Status post right hemicolectomy and reanastomosis. Aortic Atherosclerosis (ICD10-I70.0). Electronically Signed   By: Jearld Lesch M.D.   On: 04/08/2023 11:30   DG Chest Port 1 View Result Date: 04/07/2023 CLINICAL DATA:  Productive cough, weakness and shortness of breath. EXAM: PORTABLE CHEST 1 VIEW COMPARISON:  10/23/2022.  Chest CT dated 09/14/2022. FINDINGS: Stable enlarged cardiac silhouette and calcified thoracic aortic arch. Stable left subclavian pacemaker leads. Stable pleural and parenchymal scarring in the left lower lung zone and small amount of parenchymal scarring at the right lateral lung base. Stable aortic stent valve. Stable post CABG changes and left atrial clip. Previously noted old right scapular fracture. IMPRESSION: 1. No acute abnormality. 2. Stable cardiomegaly. Electronically Signed   By: Beckie Salts M.D.   On: 04/07/2023 12:28        Scheduled Meds:  aspirin EC  81 mg Oral q AM   clopidogrel  75 mg Oral Q breakfast   guaiFENesin-dextromethorphan  15 mL Oral Q8H   insulin aspart  0-5 Units Subcutaneous QHS   insulin aspart  0-9 Units Subcutaneous TID WC   metoprolol tartrate  25 mg Oral BID   predniSONE  50 mg Oral Q breakfast   sevelamer carbonate  2,400 mg Oral TID WC   Continuous Infusions:   LOS: 1 day    Time spent: 45  minutes spent on chart review, discussion with nursing staff, consultants, updating family and interview/physical exam; more than 50% of that time was spent in counseling and/or coordination of care.    Joseph Art, DO [facility] Available via Epic secure chat 7am-7pm After these hours, please refer to coverage provider listed on [email or web address] 04/08/2023, 11:51 AM

## 2023-04-08 NOTE — Progress Notes (Signed)
RT went to place patient on CPAP.  Patient already wearing CPAP resting comfortably.

## 2023-04-08 NOTE — Progress Notes (Signed)
Rounding Note    Patient Name: Bradley Soto Date of Encounter: 04/08/2023  [facility] Cardiologist: Kristeen Miss, MD    Subjective   83 year old gentleman with a history of coronary artery disease, CABG, end-stage renal disease on hemodialysis and he has a history of TAVR several years ago.  He has a history of PCI of his distal free LIMA to LAD graft and at the anastomosis from the free graft into the LAD.  2 months ago.  He presented to [facility] with worsening shortness of breath for the past several weeks.  He has had a cough and significant fatigue.  He required BiPAP.  He has had multiple runs of nonsustained ventricular tachycardia-likely due to hypoxemia. He was found to have positive COVID-19 and RSV.  Inpatient Medications    Scheduled Meds:  aspirin EC  81 mg Oral q AM   clopidogrel  75 mg Oral Q breakfast   guaiFENesin-dextromethorphan  15 mL Oral Q8H   insulin aspart  0-5 Units Subcutaneous QHS   insulin aspart  0-9 Units Subcutaneous TID WC   metoprolol tartrate  25 mg Oral BID   sevelamer carbonate  2,400 mg Oral TID WC   Continuous Infusions:  PRN Meds: ipratropium-albuterol, melatonin, ondansetron **OR** ondansetron (ZOFRAN) IV, polyethylene glycol   Vital Signs    Vitals:   04/07/23 2120 04/08/23 0000 04/08/23 0414 04/08/23 0729  BP: 123/60 (!) 141/119 (!) 95/56 (!) 117/58  Pulse: 76 90 78 66  Resp: (!) 22 (!) 24 17 20   Temp: 98.7 F (37.1 C) 98.6 F (37 C) 98.6 F (37 C) 97.7 F (36.5 C)  TempSrc: Oral Oral Oral Oral  SpO2: 98% 98% 94% 98%  Weight: 97.5 kg  98 kg   Height: 5\' 11"  (1.803 m)       Intake/Output Summary (Last 24 hours) at 04/08/2023 1130 Last data filed at 04/07/2023 1634 Gross per 24 hour  Intake 1164.22 ml  Output --  Net 1164.22 ml      04/08/2023    4:14 AM 04/07/2023    9:20 PM 04/07/2023   11:38 AM  Last 3 Weights  Weight (lbs) 216 lb 0.8 oz 214 lb 15.2 oz 228 lb  Weight (kg) 98 kg 97.5 kg  103.42 kg      Telemetry    V pacing  - Personally Reviewed  ECG     - Personally Reviewed  Physical Exam   GEN: eldelry male,  mod - severe respiratory distress  Neck: No JVD Cardiac: RR .  Respiratory: extensive wheezing , rhonchi posteriorly  GI: Soft, nontender, non-distended  MS: No edema; No deformity. Neuro:  Nonfocal  Psych: Normal affect   Labs    High Sensitivity Troponin:   Recent Labs  Lab 04/07/23 1206 04/07/23 1422 04/07/23 1753 04/08/23 0853  TROPONINIHS 7,535* 6,396* 6,663* 6,165*     Chemistry Recent Labs  Lab 04/07/23 1206 04/08/23 0228  NA 135 137  K 4.5 5.5*  CL 93* 95*  CO2 22 20*  GLUCOSE 154* 187*  BUN 64* 71*  CREATININE 8.40* 9.56*  CALCIUM 8.6* 8.4*  MG 2.3  --   PROT 7.2 6.1*  ALBUMIN 3.2* 2.7*  AST 578* 1,026*  ALT 309* 614*  ALKPHOS 63 85  BILITOT 0.6 0.8  GFRNONAA 6* 5*  ANIONGAP 20* 22*    Lipids No results for input(s): "CHOL", "TRIG", "HDL", "LABVLDL", "LDLCALC", "CHOLHDL" in the last 168 hours.  Hematology Recent Labs  Lab 04/07/23  1206 04/08/23 0228  WBC 5.0 6.1  RBC 3.01* 2.88*  HGB 10.1* 9.3*  HCT 31.8* 29.7*  MCV 105.6* 103.1*  MCH 33.6 32.3  MCHC 31.8 31.3  RDW 16.3* 16.2*  PLT 78* 69*   Thyroid  Recent Labs  Lab 04/07/23 1422  TSH 2.194    BNP Recent Labs  Lab 04/07/23 1206  BNP 1,165.0*    DDimer  Recent Labs  Lab 04/07/23 1914 04/08/23 0228  DDIMER 2.72* 3.90*     Radiology    DG Chest Port 1 View Result Date: 04/07/2023 CLINICAL DATA:  Productive cough, weakness and shortness of breath. EXAM: PORTABLE CHEST 1 VIEW COMPARISON:  10/23/2022.  Chest CT dated 09/14/2022. FINDINGS: Stable enlarged cardiac silhouette and calcified thoracic aortic arch. Stable left subclavian pacemaker leads. Stable pleural and parenchymal scarring in the left lower lung zone and small amount of parenchymal scarring at the right lateral lung base. Stable aortic stent valve. Stable post CABG changes and  left atrial clip. Previously noted old right scapular fracture. IMPRESSION: 1. No acute abnormality. 2. Stable cardiomegaly. Electronically Signed   By: Beckie Salts M.D.   On: 04/07/2023 12:28    Cardiac Studies     Patient Profile     83 y.o. male   Assessment & Plan    Respiratory distress: The patient is admitted with 3 weeks of progressive shortness of breath.  He is found to have COVID-19 and RSV infection.  He has significant wheezing and rales/rhonchi.  He has not had any episodes of chest pain.  At this point I do not think that we necessarily are looking at an acute coronary syndrome.  I think that that his troponin levels are due to severe strain in the setting of underlying severe native coronary artery disease.  Will look at his echocardiogram.  As long as his LV function is basically unchanged from his previous echo then I think we will continue with conservative medical therapy.  2.  RSV / COVID infection :   needs continued treatment / support for his RSV and COVID infections          For questions or updates, please contact [facility] Please consult [email or web address] for contact info under        Signed, Kristeen Miss, MD  04/08/2023, 11:30 AM

## 2023-04-08 NOTE — Plan of Care (Signed)
  Problem: Respiratory: Goal: Will maintain a patent airway Outcome: Progressing   Problem: Respiratory: Goal: Complications related to the disease process, condition or treatment will be avoided or minimized Outcome: Progressing   

## 2023-04-08 NOTE — Progress Notes (Signed)
Patient's wife called and asking why the patient hadn't done dialysis yet. RN told the wife that the Day shift RN called the HD and they said they will call us once they are ready to get report from the RN and bring the patient to HD. Patient's wife said she's upset and she felt that her husband was "neglected". RN called HD to ask an update and they said they will go down and bring the patient tonight. RN updated the patient.

## 2023-04-08 NOTE — Consult Note (Addendum)
Renal Service Consult Note [facility]  Bradley Soto 04/08/2023 Bradley Krabbe, MD Requesting Physician: Dr. Benjamine Mola  Reason for Consult: ESRD pt w/ COVID/ RSV infections HPI: The patient is a 83 y.o. year-old w/ PMH as below who presented to ED c/o recurrent coughing spells, gen'd weakness, loss of appetite and SOB. TTS HD pt, had HD on Tuesday. Last week he tested negative for COVID/ flu / RSV. But his wife tested + for COVID. These symptoms have been ongoing for 2-3 weeks, just getting worse. Required some bipap in ED overnight. No leg swelling, no n/v/d, no fevers. Missed 1 HD last week, not this week. In ED temp 98, HR 70s, RR 13-34,  bp 76/62 --> 134/ 52 after fluids. Had runs of NSVT in the ED. Trop 7535 > 6396. BNP 1165. LA 4.2 >> 4.8. LFT's up AST 578/ ALT 309. CXR negative. 1 L bolus given in ED, 150mg  amiodarone given. Started on bipap and improved significantly, also the NSVT runs abated w/ bipap. Pt was admitted. Cardiology consulting. We are asked to see for dialysis.    Pt seen in room. Wife at bedside. Main c/o's have been coughing and gen'd weakness. Also nausea. Some SOB. Some confusion also per the wife, although at the moment he is Ox3.     PMH Atrial fib CAD ESRD on HD Gout HL HTN Cardiac pacemaker S/P TAVR DM2   ROS - denies CP, no joint pain, no HA, no blurry vision, no rash, no diarrhea, no nausea/ vomiting, no dysuria, no difficulty voiding   Past Medical History  Past Medical History:  Diagnosis Date   Anxiety    Atrial fibrillation (HCC)    Bell's palsy    left side of face droops   CAD (coronary artery disease)    s/p stent placement   Colon polyps    ESRD (end stage renal disease) (HCC)    [place]- [facility]- [facility]   Gout    History of blood transfusion    History of kidney stones    Passes   HLD (hyperlipidemia)    HTN (hypertension)    Insomnia    MI (myocardial infarction) (HCC) 1998   Neuropathy    NSTEMI (non-ST  elevated myocardial infarction) (HCC)    s/p PCI in early 2000s   OSA on CPAP    wears CPAP   Presence of permanent cardiac pacemaker    S/P TAVR (transcatheter aortic valve replacement) 08/25/2022   s/p TAVR with a 26 mm Edwards S3UR via the TF approach by Dr. Lynnette Caffey & Dr. Laneta Simmers   Severe aortic stenosis    Type II or unspecified type diabetes mellitus without mention of complication, not stated as uncontrolled    Wears glasses    reading    Wears partial dentures    Past Surgical History  Past Surgical History:  Procedure Laterality Date   APPENDECTOMY     AV FISTULA PLACEMENT Right 07/28/2019   Procedure: RIGHT ARM Brachiocephalic ARTERIOVENOUS (AV) FISTULA CREATION;  Surgeon: Nada Libman, MD;  Location: [facility] OR;  Service: Vascular;  Laterality: Right;   CATARACT EXTRACTION     CLIPPING OF ATRIAL APPENDAGE  12/05/2018   Procedure: Clipping Of Atrial Appendage;  Surgeon: Linden Dolin, MD;  Location: [facility] OR;  Service: Open Heart Surgery;;   CORONARY ANGIOPLASTY WITH STENT PLACEMENT     x4   CORONARY ARTERY BYPASS GRAFT N/A 12/05/2018   Procedure: CORONARY ARTERY BYPASS GRAFTING (CABG) X FOUR USING LEFT  INTERNAL MAMMARY ARTERY AND RIGHT SAPHENOUS VEIN GRAFTS;  Surgeon: Linden Dolin, MD;  Location: [facility] OR;  Service: Open Heart Surgery;  Laterality: N/A;   CORONARY STENT INTERVENTION N/A 01/27/2023   Procedure: CORONARY STENT INTERVENTION;  Surgeon: Marykay Lex, MD;  Location: [facility] INVASIVE CV LAB;  Service: Cardiovascular;  Laterality: N/A;   HERNIA REPAIR     ventral hernia repair with mesh--revision also was required   INSERT / REPLACE / REMOVE PACEMAKER  05/10/2000   pacemaker implanted in [place]]   INTRAOPERATIVE TRANSTHORACIC ECHOCARDIOGRAM N/A 08/25/2022   Procedure: INTRAOPERATIVE TRANSTHORACIC ECHOCARDIOGRAM;  Surgeon: Orbie Pyo, MD;  Location: [facility] INVASIVE CV LAB;  Service: Open Heart Surgery;  Laterality: N/A;   IR THORACENTESIS ASP PLEURAL SPACE W/IMG  GUIDE  12/21/2018   IR THORACENTESIS ASP PLEURAL SPACE W/IMG GUIDE  12/30/2018   LEFT HEART CATH AND CORONARY ANGIOGRAPHY N/A 11/30/2018   Procedure: LEFT HEART CATH AND CORONARY ANGIOGRAPHY;  Surgeon: Kathleene Hazel, MD;  Location: [facility] INVASIVE CV LAB;  Service: Cardiovascular;  Laterality: N/A;   LEFT HEART CATH AND CORS/GRAFTS ANGIOGRAPHY N/A 01/27/2023   Procedure: LEFT HEART CATH AND CORS/GRAFTS ANGIOGRAPHY;  Surgeon: Marykay Lex, MD;  Location: [facility] INVASIVE CV LAB;  Service: Cardiovascular;  Laterality: N/A;   MAZE N/A 12/05/2018   Procedure: MAZE;  Surgeon: Linden Dolin, MD;  Location: [facility];  Service: Open Heart Surgery;  Laterality: N/A;   PACEMAKER GENERATOR CHANGE  09/03/09   MDT Adapta generator change in [place]   PARTIAL COLON REMOVAL  2/2   polyp that could not be removed on colonoscopy   PPM GENERATOR CHANGEOUT N/A 06/01/2019   Procedure: PPM GENERATOR CHANGEOUT;  Surgeon: Hillis Range, MD;  Location: [facility] INVASIVE CV LAB;  Service: Cardiovascular;  Laterality: N/A;   REVISON OF ARTERIOVENOUS FISTULA Right 01/24/2020   Procedure: BANDING OF RIGHT ARM ARTERIOVENOUS FISTULA;  Surgeon: Nada Libman, MD;  Location: [facility] OR;  Service: Vascular;  Laterality: Right;   RIGHT/LEFT HEART CATH AND CORONARY/GRAFT ANGIOGRAPHY N/A 07/31/2022   Procedure: RIGHT/LEFT HEART CATH AND CORONARY/GRAFT ANGIOGRAPHY;  Surgeon: Marykay Lex, MD;  Location: [facility] INVASIVE CV LAB;  Service: Cardiovascular;  Laterality: N/A;   TEE WITHOUT CARDIOVERSION N/A 12/05/2018   Procedure: TRANSESOPHAGEAL ECHOCARDIOGRAM (TEE);  Surgeon: Linden Dolin, MD;  Location: [facility] OR;  Service: Open Heart Surgery;  Laterality: N/A;   TRANSCATHETER AORTIC VALVE REPLACEMENT, TRANSFEMORAL N/A 08/25/2022   Procedure: Transcatheter Aortic Valve Replacement, Transfemoral;  Surgeon: Orbie Pyo, MD;  Location: [facility] INVASIVE CV LAB;  Service: Open Heart Surgery;  Laterality: N/A;   Family History  Family History   Problem Relation Age of Onset   Heart attack Father 8   Heart disease Father    Breast cancer Sister    Pulmonary embolism Sister    Deep vein thrombosis Sister    Social History  reports that he has quit smoking. His smoking use included cigarettes. He has never used smokeless tobacco. He reports that he does not drink alcohol and does not use drugs. Allergies  Allergies  Allergen Reactions   Shellfish Allergy Rash and Other (See Comments)   Home medications Prior to Admission medications   Medication Sig Start Date End Date Taking? Authorizing Provider  amoxicillin (AMOXIL) 500 MG tablet Take 4 tablets (2,000 mg total) by mouth as directed. 1 hour prior to dental work including cleanings 09/02/22   Janetta Hora, PA-C  aspirin EC 81 MG tablet Take 81 mg  by mouth in the morning.    [provider]  AURYXIA 1 GM 210 MG(Fe) tablet Ferric Citrate 210 mg 03/25/23 03/24/24  [provider]  B Complex-C-Zn-Folic Acid (DIALYVITE 800 WITH ZINC) 0.8 MG TABS Take 1 tablet by mouth at bedtime. 11/08/19   [provider]  clopidogrel (PLAVIX) 75 MG tablet Take 1 tablet (75 mg total) by mouth daily with breakfast. 01/28/23   Marykay Lex, MD  Cupric Glycinate (COPPER [place]) POWD Take 2 capsules by mouth in the morning.    [provider]  Cyanocobalamin (VITAMIN B-12 PO) Take 1 capsule by mouth in the morning.    [provider]  diclofenac Sodium (VOLTAREN) 1 % GEL Apply 1 Application topically 4 (four) times daily as needed (neck pain).    [provider]  diltiazem (CARDIZEM SR) 60 MG 12 hr capsule Take 1 capsule (60 mg total) by mouth as needed (palpitations). 05/22/22   Sharlene Dory, PA-C  diltiazem (CARDIZEM SR) 60 MG 12 hr capsule 1 capsule Orally Twice a day As needed    [provider]  diphenhydrAMINE HCl, Sleep, (ZZZQUIL PO) Take 1-2 tablets by mouth at bedtime. PureZzzs Sleep Supplement (melatonin/chamomile/lavender)     [provider]  diphenhydramine-acetaminophen (TYLENOL PM) 25-500 MG TABS tablet Take 1 tablet by mouth at bedtime.    [provider]  gabapentin (NEURONTIN) 100 MG capsule Take 3 capsules (300 mg total) by mouth 2 (two) times daily. 01/28/23   Marykay Lex, MD  Methoxy PEG-Epoetin Beta (MIRCERA IJ) Mircera 05/23/22 05/22/23  [provider]  Methoxy PEG-Epoetin Beta (MIRCERA IJ) Mircera 06/09/22 06/08/23  [provider]  nitroGLYCERIN (NITROSTAT) 0.4 MG SL tablet Place 1 tablet (0.4 mg total) under the tongue every 5 (five) minutes as needed for chest pain. 05/22/22   Sharlene Dory, PA-C  NOVOLOG MIX 70/30 FLEXPEN (70-30) 100 UNIT/ML FlexPen Inject 30 Units into the skin in the morning and at bedtime. 02/26/16   [provider]  sevelamer carbonate (RENVELA) 800 MG tablet Take 3 tablets (2,400 mg total) by mouth 3 (three) times daily with meals. 01/28/23   Marykay Lex, MD  triamcinolone cream (KENALOG) 0.5 % 1 application External Twice a day as needed for rash for 30 day(s) 03/12/23   [provider]  VELPHORO 500 MG chewable tablet CRUSH OR CHEW AND SWALLOW 1 TABLET 3 TIMES A DAY WITH MEALS AND 1 TABLET TWICE A DAY WITH SNACKS Oral for 30 Days 02/23/23   [provider]     Vitals:   04/07/23 2120 04/08/23 0000 04/08/23 0414 04/08/23 0729  BP: 123/60 (!) 141/119 (!) 95/56 (!) 117/58  Pulse: 76 90 78 66  Resp: (!) 22 (!) 24 17 20   Temp: 98.7 F (37.1 C) 98.6 F (37 C) 98.6 F (37 C) 97.7 F (36.5 C)  TempSrc: Oral Oral Oral Oral  SpO2: 98% 98% 94% 98%  Weight: 97.5 kg  98 kg   Height: 5\' 11"  (1.803 m)      Exam Gen alert, no distress, 3 L [place] O2 Looks tired, coughing off and on No rash, cyanosis or gangrene Sclera anicteric, throat clear  No jvd or bruits Chest clear bilat to bases, no rales/ wheezing RRR no MRG Abd soft ntnd no mass or ascites +bs GU nl male MS no joint effusions or deformity Ext no pitting LE  or UE edema Neuro is alert, Ox 3 , nf    RUA AVF+bruit  Renal-related home meds: - velphoro 1 ac tid - auryxia 210 ac - dialyvite - cardizem SR 60 prn palpitation - renvela 2400 ac tid    OP HD: [facility] TTS 4h  B400   96.6kg   2K bath  AVF   Heparin 3000 - last HD 2/18, post wt 96.6kg - compliant, gets to dry wt, UF 2-4 kg  - mircera IV q2, last 2/08, due 2/22 - venofer 50mg  weekly - rocaltrol 1.5 mcg po tts   Assessment/ Plan: Viral bronchitis / COVID+ / RSV + - was on bipap in ED but now on 3L [place] O2. Per pmd, cont nebs. Out of window for antivirals.  Elevated troponins - per cardiology, prob not MI ESRD - on HD TTS. Has not missed this week. HD today/ tonight.  HTN -  not on any BP lowering meds at home or here. BP's at OP HD are normal to low-normal.  Volume - at dry wt, no vol excess on exam. Consider attempt to lower dry wt by 1 kg .  Anemia of esrd - Hb 9- 10 here. Next esa due 2/22, will order.  Secondary hyperparathyroidism - CCa in range, phos is high. Cont binders w/ meals - he is supposed to be on [place] 420mg  ac tid now, will dc renvela. Marland Kitchen  PPM S/P TAVR      Vinson Moselle  MD [facility] 04/08/2023, 9:14 AM  Recent Labs  Lab 04/07/23 1206 04/08/23 0228  HGB 10.1* 9.3*  ALBUMIN 3.2* 2.7*  CALCIUM 8.6* 8.4*  PHOS  --  9.7*  CREATININE 8.40* 9.56*  K 4.5 5.5*   Inpatient medications:  aspirin EC  81 mg Oral q AM   clopidogrel  75 mg Oral Q breakfast   guaiFENesin-dextromethorphan  15 mL Oral Q8H   insulin aspart  0-5 Units Subcutaneous QHS   insulin aspart  0-9 Units Subcutaneous TID WC   sevelamer carbonate  2,400 mg Oral TID WC    ipratropium-albuterol, melatonin, ondansetron **OR** ondansetron (ZOFRAN) IV, polyethylene glycol

## 2023-04-08 NOTE — Plan of Care (Signed)
  Problem: Education: Goal: Knowledge of risk factors and measures for prevention of condition will improve Outcome: Progressing   Problem: Coping: Goal: Psychosocial and spiritual needs will be supported Outcome: Progressing   Problem: Respiratory: Goal: Will maintain a patent airway Outcome: Progressing   Problem: Respiratory: Goal: Complications related to the disease process, condition or treatment will be avoided or minimized Outcome: Progressing   Problem: Education: Goal: Knowledge of General Education information will improve Description: Including pain rating scale, medication(s)/side effects and non-pharmacologic comfort measures Outcome: Progressing

## 2023-04-08 NOTE — Progress Notes (Signed)
Echocardiogram 2D Echocardiogram has been performed.  Bradley Soto 04/08/2023, 1:25 PM

## 2023-04-08 NOTE — TOC CM/SW Note (Addendum)
Transition of Care [facility]) - Inpatient Brief Assessment   Patient Details  Name: Colbert Curenton MRN: [id number] Date of Birth: 12-03-1940  Transition of Care [facility]) CM/SW Contact:    Leone Haven, RN Phone Number: 04/08/2023, 4:17 PM   Clinical Narrative: From home with spouse, has PCP , Dr. Link Snuffer, and insurance on file, has no [facility] services in place at this time , has rollator and cane and cpap at home. Wife will transport them home at dc and wife  is support system, gets medications from [facility] on [address]  Pta self ambulatory  with rollator /or cane.     Transition of Care Asessment: Insurance and Status: Insurance coverage has been reviewed Patient has primary care physician: Yes Home environment has been reviewed: home with wife Prior level of function:: indep with walker and cane Prior/Current Home Services: No current home services Social Drivers of Health Review: SDOH reviewed no interventions necessary Readmission risk has been reviewed: Yes Transition of care needs: no transition of care needs at this time

## 2023-04-08 NOTE — Progress Notes (Signed)
   04/08/23 0000  BiPAP/CPAP/SIPAP  $ Non-Invasive Ventilator  Non-Invasive Vent Initial;Non-Invasive Vent Set Up  BiPAP/CPAP/SIPAP Pt Type Adult  BiPAP/CPAP/SIPAP Resmed  Mask Type Nasal pillows (home mask)  Dentures removed? Not applicable  EPAP 13 cmH2O  Patient Home Equipment No  Auto Titrate No  BiPAP/CPAP /SiPAP Vitals  Temp 98.6 F (37 C)  Pulse Rate 90  Resp (!) 24  BP (!) 141/119  SpO2 98 %  MEWS Score/Color  MEWS Score 1  MEWS Score Color Green   Pt resting comfortbaly with CPAP applied.  On home settings w/2L bleed in

## 2023-04-09 ENCOUNTER — Inpatient Hospital Stay (HOSPITAL_COMMUNITY): Payer: Medicare Other | Admitting: Critical Care Medicine

## 2023-04-09 DIAGNOSIS — I469 Cardiac arrest, cause unspecified: Secondary | ICD-10-CM | POA: Diagnosis not present

## 2023-04-09 DIAGNOSIS — I472 Ventricular tachycardia, unspecified: Secondary | ICD-10-CM

## 2023-04-09 LAB — HEPATITIS PANEL, ACUTE
HCV Ab: NONREACTIVE
Hep A IgM: NONREACTIVE
Hep B C IgM: REACTIVE — AB
Hepatitis B Surface Ag: NONREACTIVE

## 2023-04-09 LAB — HEPATITIS B SURFACE ANTIBODY, QUANTITATIVE: Hep B S AB Quant (Post): 813 m[IU]/mL

## 2023-04-12 ENCOUNTER — Telehealth: Payer: Self-pay | Admitting: Cardiovascular Disease

## 2023-04-12 NOTE — Telephone Encounter (Signed)
 Wife Maralyn Sago) called to let Dr. Elease Hashimoto know patient passed away on 04/29/23.

## 2023-04-15 ENCOUNTER — Other Ambulatory Visit: Payer: Self-pay | Admitting: Internal Medicine

## 2023-04-16 ENCOUNTER — Ambulatory Visit: Payer: Medicare Other | Admitting: Cardiovascular Disease

## 2023-04-17 NOTE — Death Summary Note (Signed)
 DEATH SUMMARY   Patient Details  Name: Bradley Soto MRN: [id number] DOB: 11-Aug-1940 YQM:VHQIONGE, Lorin Picket, MD Admission/Discharge Information   Admit Date:  2023-04-27  Date of Death: Date of Death: 04/29/2023  Time of Death: Time of Death: 63  Length of Stay: 2   Principle Cause of death: ventricular tachycardia  Hospital Diagnoses: Principal Problem:   Hypotension Active Problems:   Viral infection   NSVT (nonsustained ventricular tachycardia) (HCC)   Elevated troponin   Elevated liver enzymes   Thrombocytopenia (HCC)   OSA on CPAP   HTN (hypertension)   ESRD (end stage renal disease) (HCC)   Type II diabetes mellitus with renal manifestations (HCC)   S/P CABG x 4   Presence of permanent cardiac pacemaker   S/P TAVR (transcatheter aortic valve replacement) Shock liver RSV COVID-19   HPI: Bradley Soto is a 83 y.o. male with medical history significant for atrial fibrillation, ESRD, OSA, CABG, severe aortic stenosis status post TAVR, diabetes mellitus, pacemaker status. Patient was brought to the ED reports of difficulty breathing, productive cough, weakness, loss of appetite.  These symptoms have been on going for about 2 to 3 weeks and got worse over the past few days.   At the time of my evaluation, patient is awake, on BiPAP, calm, reports improvement in breathing and feeling better overall.  No chest pain.  No lower extremity swelling.  No abdominal bloating.     Assessment and Plan: Patient admitted with SOB, cough and found to have RSV and COVID-19.  He was also found to have elevated troponin.  Cardiology was consulted and echo done.  Echo read overnight and EF was found to be 20%. Patient returned from HD at 4AM.  At 5:11 AM RN was called by tele and patient was found to be unresponsive.  CODE Blue was called-- CPR started and patient was intubated.  PEA and three rounds of epi given and ROSC briefly achieved at 5:31. Thereafter pt rhythm vtach then vfib with  loss of pulse and CPR resumed at 5:35. Pt defibrillated x2, amio bolus, lidocaine without ROSC. Code terminated at 5:45.   Shock liver due to infection/low BP Thrombocytopenia due to infection ESRD-- HD on 2023/04/29 early evening Doubt acute cholecystitis as no RUQ pain and no WBCs.    The results of significant diagnostics from this hospitalization (including imaging, microbiology, ancillary and laboratory) are listed below for reference.   Significant Diagnostic Studies: US Abdomen Limited RUQ (LIVER/GB) Result Date: April 29, 2023 CLINICAL DATA:  Acute cholecystitis.  Elevated liver enzymes. EXAM: ULTRASOUND ABDOMEN LIMITED RIGHT UPPER QUADRANT COMPARISON:  CT 04/08/2023 FINDINGS: Gallbladder: Multiple stones measuring up to 1.3 cm. Gallbladder wall is thickened at 5 mm with small amount of pericholecystic fluid. Negative sonographic Murphy sign. Common bile duct: Diameter: Normal caliber, 3 mm Liver: Increased echotexture compatible with fatty infiltration. No focal abnormality or biliary ductal dilatation. Portal vein is patent on color Doppler imaging with normal direction of blood flow towards the liver. Other: None. IMPRESSION: Cholelithiasis. Gallbladder wall thickening and pericholecystic fluid concerning for acute cholecystitis. Fatty liver. Electronically Signed   By: Charlett Nose M.D.   On: 04/29/2023 00:19   ECHOCARDIOGRAM LIMITED Result Date: 04/08/2023    ECHOCARDIOGRAM LIMITED REPORT   Patient Name:   Bradley Soto Date of Exam: 04/08/2023 Medical Rec #:  [id number]      Height:       71.0 in Accession #:    [id number]     Weight:  216.0 lb Date of Birth:  [birth year]       BSA:          2.179 m Patient Age:    82 years       BP:           126/61 mmHg Patient Gender: M              HR:           97 bpm. Exam Location:  Inpatient Procedure: 2D Echo, Cardiac Doppler, Color Doppler and Intracardiac            Opacification Agent (Both Spectral and Color Flow Doppler were            utilized  during procedure). Indications:    CAD Native Vessel, Elevated Troponin  History:        Patient has prior history of Echocardiogram examinations, most                 recent 10/02/2022. Risk Factors:Hypertension, Diabetes, Former                 Smoker and Sleep Apnea.                 Aortic Valve: 26 mm Edwards Sapien prosthetic, stented (TAVR)                 valve is present in the aortic position. Procedure Date: 08/25/22.  Sonographer:    Karma Ganja Referring Phys: [phone] PHILIP J NAHSER  Sonographer Comments: Technically difficult study due to poor echo windows. Image acquisition challenging due to patient body habitus and Image acquisition challenging due to respiratory motion. IMPRESSIONS  1. Left ventricular ejection fraction, by estimation, is 25%. The left ventricle has severely decreased function. The left ventricle demonstrates global hypokinesis with regionally severe hypokinesis/akinesis in the inferior and inferoseptal walls. The left ventricular internal cavity size was mildly dilated. There is mild left ventricular hypertrophy. Left ventricular diastolic parameters are indeterminate.  2. Right ventricular systolic function is moderately reduced. The right ventricular size is mildly enlarged. There is moderately elevated pulmonary artery systolic pressure. The estimated right ventricular systolic pressure is 48.2 mmHg.  3. Left atrial size was severely dilated.  4. There is a 26 mm Edwards Sapien prosthetic (TAVR) valve present in the aortic position. Procedure Date: 08/25/22. Echo findings are consistent with normal structure and function of the aortic valve prosthesis. Aortic valve area, by VTI measures 2.32 cm. Aortic valve mean gradient measures 7.5 mmHg. Aortic valve Vmax measures 1.96 m/s, DVI 0.44.  5. The inferior vena cava is dilated in size with <50% respiratory variability, suggesting right atrial pressure of 15 mmHg. FINDINGS  Left Ventricle: Left ventricular ejection fraction, by  estimation, is 25%. The left ventricle has severely decreased function. The left ventricle demonstrates global hypokinesis. Definity contrast agent was given IV to delineate the left ventricular endocardial borders. The left ventricular internal cavity size was mildly dilated. There is mild left ventricular hypertrophy. Left ventricular diastolic parameters are indeterminate.  LV Wall Scoring: The mid inferoseptal segment and basal inferoseptal segment are akinetic. The inferior wall is hypokinetic. Right Ventricle: The right ventricular size is mildly enlarged. Right ventricular systolic function is moderately reduced. There is moderately elevated pulmonary artery systolic pressure. The tricuspid regurgitant velocity is 2.88 m/s, and with an assumed right atrial pressure of 15 mmHg, the estimated right ventricular systolic pressure is 48.2 mmHg. Left Atrium: Left atrial size was severely dilated. Tricuspid Valve:  Tricuspid valve regurgitation is mild. Aortic Valve: Aortic valve mean gradient measures 7.5 mmHg. Aortic valve peak gradient measures 15.4 mmHg. Aortic valve area, by VTI measures 2.32 cm. There is a 26 mm Edwards Sapien prosthetic, stented (TAVR) valve present in the aortic position. Procedure Date: 08/25/22. Echo findings are consistent with normal structure and function of the aortic valve prosthesis. Venous: The inferior vena cava is dilated in size with less than 50% respiratory variability, suggesting right atrial pressure of 15 mmHg. Additional Comments: A device lead is visualized in the right atrium and right ventricle. Spectral Doppler performed. Color Doppler performed.  LEFT VENTRICLE PLAX 2D LVIDd:         6.20 cm LVIDs:         5.50 cm LV PW:         1.30 cm LV IVS:        1.20 cm LVOT diam:     2.60 cm LV SV:         64 LV SV Index:   29 LVOT Area:     5.31 cm  LV Volumes (MOD) LV vol d, MOD A2C: 152.0 ml LV vol d, MOD A4C: 154.5 ml LV vol s, MOD A2C: 117.0 ml LV vol s, MOD A4C: 125.0 ml LV  SV MOD A2C:     35.0 ml LV SV MOD A4C:     154.5 ml LV SV MOD BP:      34.4 ml RIGHT VENTRICLE         IVC TAPSE (M-mode): 2.1 cm  IVC diam: 2.80 cm LEFT ATRIUM              Index LA diam:        4.40 cm  2.02 cm/m LA Vol (A2C):   130.0 ml 59.66 ml/m LA Vol (A4C):   153.0 ml 70.22 ml/m LA Biplane Vol: 141.0 ml 64.71 ml/m  AORTIC VALVE AV Area (Vmax):    2.19 cm AV Area (Vmean):   2.16 cm AV Area (VTI):     2.32 cm AV Vmax:           196.00 cm/s AV Vmean:          123.500 cm/s AV VTI:            0.274 m AV Peak Grad:      15.4 mmHg AV Mean Grad:      7.5 mmHg LVOT Vmax:         80.90 cm/s LVOT Vmean:        50.200 cm/s LVOT VTI:          0.120 m LVOT/AV VTI ratio: 0.44  AORTA Ao Root diam: 3.30 cm Ao Asc diam:  2.90 cm MITRAL VALVE                TRICUSPID VALVE MV Area (PHT): 3.93 cm     TR Peak grad:   33.2 mmHg MV Decel Time: 193 msec     TR Vmax:        288.00 cm/s MV E velocity: 148.00 cm/s MV A velocity: 118.00 cm/s  SHUNTS MV E/A ratio:  1.25         Systemic VTI:  0.12 m                             Systemic Diam: 2.60 cm Weston Brass MD Electronically signed by Weston Brass MD Signature Date/Time: 04/08/2023/10:28:54 PM  Final    CT Angio Chest Pulmonary Embolism (PE) W or WO Contrast Result Date: 04/08/2023 CLINICAL DATA:  Elevated liver enzymes, hypotension, suspicious liver lesion identified by prior CT, dyspnea, hypotension, COVID and RSV, shortness of breath * Tracking Code: BO * EXAM: CT ANGIOGRAPHY CHEST CT ABDOMEN AND PELVIS WITH CONTRAST TECHNIQUE: Multidetector CT imaging of the chest was performed using the standard protocol during bolus administration of intravenous contrast. Multiplanar CT image reconstructions and MIPs were obtained to evaluate the vascular anatomy. Multidetector CT imaging of the abdomen and pelvis was performed using the standard protocol during bolus administration of intravenous contrast. RADIATION DOSE REDUCTION: This exam was performed according to the  departmental dose-optimization program which includes automated exposure control, adjustment of the mA and/or kV according to patient size and/or use of iterative reconstruction technique. CONTRAST:  75mL OMNIPAQUE IOHEXOL 350 MG/ML SOLN COMPARISON:  CT chest, 12/16/2022 FINDINGS: CT CHEST ANGIOGRAM FINDINGS Cardiovascular: Satisfactory opacification of the pulmonary arteries to the segmental level. No evidence of pulmonary embolism. Cardiomegaly status post median sternotomy and CABG. Three-vessel coronary artery calcifications and or stents. Left atrial appendage clip. No pericardial effusion. Left chest multi lead pacer. Aortic atherosclerosis. Aortic valve stent endograft. Mediastinum/Nodes: No enlarged mediastinal, hilar, or axillary lymph nodes. Small hiatal hernia. Thyroid gland, trachea, and esophagus demonstrate no significant findings. Lungs/Pleura: Scattered areas of irregular ground-glass airspace opacity throughout the lungs. Diffuse bilateral bronchial wall thickening. No pleural effusion or pneumothorax. Musculoskeletal: Bilateral gynecomastia.  No acute osseous findings. Review of the MIP images confirms the above findings. CT ABDOMEN PELVIS FINDINGS Hepatobiliary: No solid liver abnormality is seen. Hepatomegaly, maximum coronal span 24.6 cm. Small gallstones. Gallbladder wall thickening and trace pericholecystic fluid (series 3, image 36). No biliary ductal dilatation. Pancreas: Scattered calcifications throughout the atrophic pancreas, consistent with chronic stigmata of pancreatitis. No pancreatic ductal dilatation. Spleen: Mild splenomegaly, maximum coronal span 13.9 cm. Adrenals/Urinary Tract: Adrenal glands are unremarkable. Atrophic kidneys. Small nonobstructive bilateral renal calculi and renal vascular calcifications. No hydronephrosis. Bladder is unremarkable. Stomach/Bowel: Stomach is within normal limits. Mild wall thickening and inflammatory fluid about the descending duodenal (series  3, image 39). Status post right hemicolectomy and reanastomosis. No evidence of bowel wall thickening, distention, or other inflammatory changes. Sigmoid diverticulosis. Vascular/Lymphatic: Severe, pipe like aortic atherosclerosis. No enlarged abdominal or pelvic lymph nodes. Reproductive: Prostatomegaly. Other: Broad-based midline ventral hernia with mesh repair. No ascites. Musculoskeletal: No acute or significant osseous findings. IMPRESSION: 1. Negative examination for pulmonary embolism. 2. Diffuse bilateral bronchial wall thickening and scattered areas of irregular ground-glass airspace opacity throughout the lungs, consistent with atypical or viral infection and in keeping with reported COVID and RSV. 3. Cholelithiasis. Gallbladder wall thickening and trace pericholecystic fluid, concerning for acute cholecystitis. 4. Mild wall thickening and inflammatory fluid about the descending duodenum. This may be reactive to adjacent gallbladder inflammation or reflect groove pancreatitis. 5. Hepatomegaly and splenomegaly. 6. Atrophic kidneys. Small nonobstructive bilateral renal calculi. No hydronephrosis. 7. Status post right hemicolectomy and reanastomosis. Aortic Atherosclerosis (ICD10-I70.0). Electronically Signed   By: Jearld Lesch M.D.   On: 04/08/2023 11:30   CT ABDOMEN PELVIS W CONTRAST Result Date: 04/08/2023 CLINICAL DATA:  Elevated liver enzymes, hypotension, suspicious liver lesion identified by prior CT, dyspnea, hypotension, COVID and RSV, shortness of breath * Tracking Code: BO * EXAM: CT ANGIOGRAPHY CHEST CT ABDOMEN AND PELVIS WITH CONTRAST TECHNIQUE: Multidetector CT imaging of the chest was performed using the standard protocol during bolus administration of intravenous contrast. Multiplanar CT image  reconstructions and MIPs were obtained to evaluate the vascular anatomy. Multidetector CT imaging of the abdomen and pelvis was performed using the standard protocol during bolus administration of  intravenous contrast. RADIATION DOSE REDUCTION: This exam was performed according to the departmental dose-optimization program which includes automated exposure control, adjustment of the mA and/or kV according to patient size and/or use of iterative reconstruction technique. CONTRAST:  75mL OMNIPAQUE IOHEXOL 350 MG/ML SOLN COMPARISON:  CT chest, 12/16/2022 FINDINGS: CT CHEST ANGIOGRAM FINDINGS Cardiovascular: Satisfactory opacification of the pulmonary arteries to the segmental level. No evidence of pulmonary embolism. Cardiomegaly status post median sternotomy and CABG. Three-vessel coronary artery calcifications and or stents. Left atrial appendage clip. No pericardial effusion. Left chest multi lead pacer. Aortic atherosclerosis. Aortic valve stent endograft. Mediastinum/Nodes: No enlarged mediastinal, hilar, or axillary lymph nodes. Small hiatal hernia. Thyroid gland, trachea, and esophagus demonstrate no significant findings. Lungs/Pleura: Scattered areas of irregular ground-glass airspace opacity throughout the lungs. Diffuse bilateral bronchial wall thickening. No pleural effusion or pneumothorax. Musculoskeletal: Bilateral gynecomastia.  No acute osseous findings. Review of the MIP images confirms the above findings. CT ABDOMEN PELVIS FINDINGS Hepatobiliary: No solid liver abnormality is seen. Hepatomegaly, maximum coronal span 24.6 cm. Small gallstones. Gallbladder wall thickening and trace pericholecystic fluid (series 3, image 36). No biliary ductal dilatation. Pancreas: Scattered calcifications throughout the atrophic pancreas, consistent with chronic stigmata of pancreatitis. No pancreatic ductal dilatation. Spleen: Mild splenomegaly, maximum coronal span 13.9 cm. Adrenals/Urinary Tract: Adrenal glands are unremarkable. Atrophic kidneys. Small nonobstructive bilateral renal calculi and renal vascular calcifications. No hydronephrosis. Bladder is unremarkable. Stomach/Bowel: Stomach is within normal  limits. Mild wall thickening and inflammatory fluid about the descending duodenal (series 3, image 39). Status post right hemicolectomy and reanastomosis. No evidence of bowel wall thickening, distention, or other inflammatory changes. Sigmoid diverticulosis. Vascular/Lymphatic: Severe, pipe like aortic atherosclerosis. No enlarged abdominal or pelvic lymph nodes. Reproductive: Prostatomegaly. Other: Broad-based midline ventral hernia with mesh repair. No ascites. Musculoskeletal: No acute or significant osseous findings. IMPRESSION: 1. Negative examination for pulmonary embolism. 2. Diffuse bilateral bronchial wall thickening and scattered areas of irregular ground-glass airspace opacity throughout the lungs, consistent with atypical or viral infection and in keeping with reported COVID and RSV. 3. Cholelithiasis. Gallbladder wall thickening and trace pericholecystic fluid, concerning for acute cholecystitis. 4. Mild wall thickening and inflammatory fluid about the descending duodenum. This may be reactive to adjacent gallbladder inflammation or reflect groove pancreatitis. 5. Hepatomegaly and splenomegaly. 6. Atrophic kidneys. Small nonobstructive bilateral renal calculi. No hydronephrosis. 7. Status post right hemicolectomy and reanastomosis. Aortic Atherosclerosis (ICD10-I70.0). Electronically Signed   By: Jearld Lesch M.D.   On: 04/08/2023 11:30   DG Chest Port 1 View Result Date: 04/07/2023 CLINICAL DATA:  Productive cough, weakness and shortness of breath. EXAM: PORTABLE CHEST 1 VIEW COMPARISON:  10/23/2022.  Chest CT dated 09/14/2022. FINDINGS: Stable enlarged cardiac silhouette and calcified thoracic aortic arch. Stable left subclavian pacemaker leads. Stable pleural and parenchymal scarring in the left lower lung zone and small amount of parenchymal scarring at the right lateral lung base. Stable aortic stent valve. Stable post CABG changes and left atrial clip. Previously noted old right scapular  fracture. IMPRESSION: 1. No acute abnormality. 2. Stable cardiomegaly. Electronically Signed   By: Beckie Salts M.D.   On: 04/07/2023 12:28    Microbiology: Recent Results (from the past 240 hours)  Resp panel by RT-PCR (RSV, Flu A&B, Covid) Anterior Nasal Swab     Status: Abnormal   Collection Time:  04/07/23 12:46 PM   Specimen: Anterior Nasal Swab  Result Value Ref Range Status   SARS Coronavirus 2 by RT PCR POSITIVE (A) NEGATIVE Final    Comment: (NOTE) SARS-CoV-2 target nucleic acids are DETECTED.  The SARS-CoV-2 RNA is generally detectable in upper respiratory specimens during the acute phase of infection. Positive results are indicative of the presence of the identified virus, but do not rule out bacterial infection or co-infection with other pathogens not detected by the test. Clinical correlation with patient history and other diagnostic information is necessary to determine patient infection status. The expected result is Negative.  Fact Sheet for Patients: BloggerCourse.com  Fact Sheet for Healthcare Providers: SeriousBroker.it  This test is not yet approved or cleared by the [place] FDA and  has been authorized for detection and/or diagnosis of SARS-CoV-2 by FDA under an Emergency Use Authorization (EUA).  This EUA will remain in effect (meaning this test can be used) for the duration of  the COVID-19 declaration under Section 564(b)(1) of the A ct, 21 U.S.C. section 360bbb-3(b)(1), unless the authorization is terminated or revoked sooner.     Influenza A by PCR NEGATIVE NEGATIVE Final   Influenza B by PCR NEGATIVE NEGATIVE Final    Comment: (NOTE) The Xpert Xpress SARS-CoV-2/FLU/RSV plus assay is intended as an aid in the diagnosis of influenza from Nasopharyngeal swab specimens and should not be used as a sole basis for treatment. Nasal washings and aspirates are unacceptable for Xpert Xpress  SARS-CoV-2/FLU/RSV testing.  Fact Sheet for Patients: BloggerCourse.com  Fact Sheet for Healthcare Providers: SeriousBroker.it  This test is not yet approved or cleared by the [place] FDA and has been authorized for detection and/or diagnosis of SARS-CoV-2 by FDA under an Emergency Use Authorization (EUA). This EUA will remain in effect (meaning this test can be used) for the duration of the COVID-19 declaration under Section 564(b)(1) of the Act, 21 U.S.C. section 360bbb-3(b)(1), unless the authorization is terminated or revoked.     Resp Syncytial Virus by PCR POSITIVE (A) NEGATIVE Final    Comment: (NOTE) Fact Sheet for Patients: BloggerCourse.com  Fact Sheet for Healthcare Providers: SeriousBroker.it  This test is not yet approved or cleared by the [place] FDA and has been authorized for detection and/or diagnosis of SARS-CoV-2 by FDA under an Emergency Use Authorization (EUA). This EUA will remain in effect (meaning this test can be used) for the duration of the COVID-19 declaration under Section 564(b)(1) of the Act, 21 U.S.C. section 360bbb-3(b)(1), unless the authorization is terminated or revoked.  Performed at [facility], [address] [place], [place] [address]     Time spent: 55 minutes  Signed: Joseph Art, DO 04/03/2023

## 2023-04-17 NOTE — Progress Notes (Signed)
CCMD called this RN because there was something wrong with his PPM. RN get in the room. Patient was not responsive. Rapid Response Team informed. Blue code activated.

## 2023-04-17 NOTE — Progress Notes (Signed)
Late Note Entry- Apr 09, 2023  Contacted [facility] [place] this morning to be advised of pt's passing.   Olivia Canter Renal Navigator [phone]

## 2023-04-17 NOTE — Code Documentation (Addendum)
  Patient Name: Bradley Soto   MRN: [id number]   Date of Birth/ Sex: 1940-12-29 , male      Admission Date: 04/07/2023  Attending Provider: Joseph Art, DO  Primary Diagnosis: Ventricular tachycardia (HCC) [I47.20] RSV (acute bronchiolitis due to respiratory syncytial virus) [J21.0] Respiratory distress [R06.03] ESRD on hemodialysis (HCC) [N18.6, Z99.2] Hypotension [I95.9] COVID-19 [U07.1]   Indication: Pt was in his usual state of health until this AM, when he was noted to be unresponsive. Code blue was subsequently called. At the time of arrival on scene, ACLS protocol was underway.   Code initiated 520. Pt intubated @ 528. PEA and three rounds of epi given and ROSC briefly achieved at 531. Thereafter pt rhythm vtach then vfib with loss of pulse and CPR resumed at 535. Pt defibrillated x2, amio bolus, lidocaine without ROSC. Code terminated at 545.  Technical Description:  - CPR performance duration:  25 minutes  - Was defibrillation or cardioversion used? Yes   - Was external pacer placed? No  - Was patient intubated pre/post CPR? Yes   Medications Administered: Y = Yes; Blank = No Amiodarone    Atropine    Calcium  y  Epinephrine  y  Lidocaine  y  Magnesium    Norepinephrine    Phenylephrine    Sodium bicarbonate  y  Vasopressin    Other     Post CPR evaluation:  - Final Status - Was patient successfully resuscitated ? No   Miscellaneous Information:  - Time of death:  545 AM  - Primary team notified?  Yes  - Family Notified? Yes     Katheran Winn, DO   04/08/2023, 5:56 AM

## 2023-04-17 NOTE — Progress Notes (Signed)
   04/10/2023 0530  Spiritual Encounters  Type of Visit Initial  Care provided to: Pt not available  Referral source Code page  Reason for visit Code  OnCall Visit No   Chaplain responded to a code blue. Patient was attended to by the medical team.  No family present. If a chaplain is requested someone will respond.   Valerie Roys [facility]  [phone]

## 2023-04-17 NOTE — Anesthesia Procedure Notes (Signed)
Procedure Name: Intubation Date/Time: 04/06/2023 5:38 AM  Performed by: Rachel Moulds, CRNAPre-anesthesia Checklist: Patient identified, Emergency Drugs available, Suction available, Patient being monitored and Timeout performed Oxygen Delivery Method: Ambu bag Preoxygenation: Pre-oxygenation with 100% oxygen Laryngoscope Size: Glidescope and 4 Grade View: Grade I Tube type: Oral Number of attempts: 1 Airway Equipment and Method: Video-laryngoscopy and Rigid stylet Placement Confirmation: breath sounds checked- equal and bilateral, CO2 detector, positive ETCO2 and ETT inserted through vocal cords under direct vision Secured at: 23 cm Tube secured with: Tape Dental Injury: Teeth and Oropharynx as per pre-operative assessment

## 2023-04-17 NOTE — Progress Notes (Deleted)
   05-05-2023 0700  Attending Physican Contact  Attending Physician Notified Y  Attending Physician (First and Last Name) Hedy Jacob, MD  Post Mortem Checklist  Date of Death 05/05/2023  Time of Death 0545  Pronounced By Ninfa Meeker, MD  Next of kin notified Yes  Name of next of kin notified of death Tong Pieczynski  Contact Person's Relationship to Patient Spouse  Contact Person's Phone Number [phone]  Contact Person's address [address]  [place] [facility] [address]  Was the patient a No Code Blue or a Limited Code Blue? No  Did the patient die unattended? No  Patient restrained (physical/manual hold/chemical)? *See row information* Not applicable  Body preparation complete Y  [facility] (previously known as [facility])  Notification Date 2023-05-05  Notification Time 0637  [facility] Number [phone]  Is patient a potential donor? N  Autopsy  Autopsy requested by MD or Family ( Non ME Case) N/A  Patient and Hospital Property Returned  Patient is satisfied that all belongings have been returned? Yes  Name of person receiving valuables? Yves Dill  Specify valuables returned Watch, [employer], Camera operator, TEFL teacher linen/gowns NOT sent with patient or transporter Not applicable  Notifications  Patient Placement notified that Post Mortem checklist is complete Yes  Patient Placement notified body transferred Transported to [employer]  Is this a medical examiner's case? N

## 2023-04-17 NOTE — Progress Notes (Addendum)
Informed by Dr. Ninfa Meeker.  CODE BLUE called 5:20 AM.  Patient found in PEA.  ROSC achieved after 15 minutes of ACLS.  Patient soon went into VTach  Coded an additional 10 minutes.  Time of death 5:45 AM.  Patient's wife has been notified.

## 2023-04-17 NOTE — Progress Notes (Signed)
   04/15/2023 0245  Vitals  Temp (!) 97.4 F (36.3 C)  BP (!) 149/55  MAP (mmHg) 80  BP Location Left Wrist  BP Method Automatic  Patient Position (if appropriate) Lying  Pulse Rate Source Monitor  ECG Heart Rate 72  Resp 15  Oxygen Therapy  SpO2 95 %  O2 Device CPAP  Patient Activity (if Appropriate) In bed  Pulse Oximetry Type Continuous  During Treatment Monitoring  Blood Flow Rate (mL/min) 0 mL/min  Arterial Pressure (mmHg) -22.82 mmHg  Venous Pressure (mmHg) -22.42 mmHg  TMP (mmHg) 39.79 mmHg  Ultrafiltration Rate (mL/min) 652 mL/min  Dialysate Flow Rate (mL/min) 299 ml/min  Dialysate Potassium Concentration 2  Dialysate Calcium Concentration 2.5  Duration of HD Treatment -hour(s) 3.5 hour(s)  Cumulative Fluid Removed (mL) per Treatment  1000.12  HD Safety Checks Performed Yes  Intra-Hemodialysis Comments Tx completed  Post Treatment  Dialyzer Clearance Lightly streaked  Liters Processed 82.9  Fluid Removed (mL) 1000 mL  Tolerated HD Treatment Yes  Post-Hemodialysis Comments  (Pt stable)   Dialysis treatment uneventful, platelet count 69. No heparin during treatment as ordered by nephrologist on call

## 2023-04-17 NOTE — Progress Notes (Signed)
   04/15/2023 0700  Attending Physican Contact  Attending Physician Notified Y  Attending Physician (First and Last Name) Katheran Amanuel, MD  Post Mortem Checklist  Date of Death 04-15-23  Time of Death 0545  Pronounced By Ninfa Meeker, MD  Next of kin notified Yes  Name of next of kin notified of death Gauge Winski  Contact Person's Relationship to Patient Spouse  Contact Person's Phone Number [phone]  Contact Person's address [address]  [place] [facility] [address]  Was the patient a No Code Blue or a Limited Code Blue? No  Did the patient die unattended? No  Patient restrained (physical/manual hold/chemical)? *See row information* Not applicable  Body preparation complete Y  [facility] (previously known [facility])  Notification Date April 15, 2023  Notification Time 0637  [facility] Number [phone]  Is patient a potential donor? N  Autopsy  Autopsy requested by MD or Family ( Non ME Case) N/A  Patient and Hospital Property Returned  Patient is satisfied that all belongings have been returned? Yes  Name of person receiving valuables? Yves Dill  Specify valuables returned Watch, [employer], Camera operator, TEFL teacher linen/gowns NOT sent with patient or transporter Not applicable  Notifications  Patient Placement notified that Post Mortem checklist is complete Yes  Patient Placement notified body transferred Transported to [employer]  Is this a medical examiner's case? N  Funeral Home  Funeral home name/address/phone # [facility] unknown/ undecided  Planned location of pickup [place]

## 2023-04-17 DEATH — deceased

## 2023-05-11 MED FILL — Medication: Qty: 1 | Status: AC

## 2023-05-19 ENCOUNTER — Institutional Professional Consult (permissible substitution): Payer: Medicare Other | Admitting: Emergency Medicine

## 2023-08-13 ENCOUNTER — Ambulatory Visit: Payer: Medicare Other

## 2023-08-13 ENCOUNTER — Other Ambulatory Visit (HOSPITAL_COMMUNITY): Payer: Medicare Other
